# Patient Record
Sex: Male | Born: 1965 | Race: Black or African American | Hispanic: No | Marital: Single | Smoking: Never smoker
Health system: Southern US, Community
[De-identification: ages and names within clinical notes are randomized; demographics above are authoritative.]

## PROBLEM LIST (undated history)

## (undated) DIAGNOSIS — M5126 Other intervertebral disc displacement, lumbar region: Secondary | ICD-10-CM

## (undated) DIAGNOSIS — M549 Dorsalgia, unspecified: Secondary | ICD-10-CM

## (undated) DIAGNOSIS — E119 Type 2 diabetes mellitus without complications: Secondary | ICD-10-CM

## (undated) DIAGNOSIS — L97909 Non-pressure chronic ulcer of unspecified part of unspecified lower leg with unspecified severity: Secondary | ICD-10-CM

## (undated) DIAGNOSIS — N182 Chronic kidney disease, stage 2 (mild): Secondary | ICD-10-CM

## (undated) DIAGNOSIS — T4145XA Adverse effect of unspecified anesthetic, initial encounter: Secondary | ICD-10-CM

## (undated) DIAGNOSIS — R0602 Shortness of breath: Secondary | ICD-10-CM

## (undated) DIAGNOSIS — I499 Cardiac arrhythmia, unspecified: Secondary | ICD-10-CM

## (undated) DIAGNOSIS — G8929 Other chronic pain: Secondary | ICD-10-CM

## (undated) DIAGNOSIS — I4891 Unspecified atrial fibrillation: Secondary | ICD-10-CM

## (undated) DIAGNOSIS — I509 Heart failure, unspecified: Secondary | ICD-10-CM

## (undated) DIAGNOSIS — Z7901 Long term (current) use of anticoagulants: Secondary | ICD-10-CM

## (undated) DIAGNOSIS — K3532 Acute appendicitis with perforation and localized peritonitis, without abscess: Secondary | ICD-10-CM

## (undated) DIAGNOSIS — T8859XA Other complications of anesthesia, initial encounter: Secondary | ICD-10-CM

## (undated) DIAGNOSIS — I10 Essential (primary) hypertension: Secondary | ICD-10-CM

## (undated) HISTORY — PX: ABSCESS DRAINAGE: SHX1119

## (undated) HISTORY — PX: DENTAL SURGERY: SHX609

---

## 1998-05-26 ENCOUNTER — Emergency Department (HOSPITAL_COMMUNITY): Admission: EM | Admit: 1998-05-26 | Discharge: 1998-05-26 | Payer: Self-pay | Admitting: Emergency Medicine

## 1998-07-15 ENCOUNTER — Emergency Department (HOSPITAL_COMMUNITY): Admission: EM | Admit: 1998-07-15 | Discharge: 1998-07-15 | Payer: Self-pay | Admitting: Emergency Medicine

## 1998-07-25 ENCOUNTER — Emergency Department (HOSPITAL_COMMUNITY): Admission: EM | Admit: 1998-07-25 | Discharge: 1998-07-25 | Payer: Self-pay | Admitting: Emergency Medicine

## 1999-10-28 ENCOUNTER — Encounter: Payer: Self-pay | Admitting: Emergency Medicine

## 1999-10-28 ENCOUNTER — Emergency Department (HOSPITAL_COMMUNITY): Admission: EM | Admit: 1999-10-28 | Discharge: 1999-10-28 | Payer: Self-pay | Admitting: Emergency Medicine

## 2004-08-30 ENCOUNTER — Ambulatory Visit (HOSPITAL_COMMUNITY): Admission: RE | Admit: 2004-08-30 | Discharge: 2004-08-30 | Payer: Self-pay | Admitting: Emergency Medicine

## 2004-08-30 ENCOUNTER — Emergency Department (HOSPITAL_COMMUNITY): Admission: EM | Admit: 2004-08-30 | Discharge: 2004-08-30 | Payer: Self-pay | Admitting: Emergency Medicine

## 2009-03-26 ENCOUNTER — Emergency Department (HOSPITAL_COMMUNITY): Admission: EM | Admit: 2009-03-26 | Discharge: 2009-03-26 | Payer: Self-pay | Admitting: Emergency Medicine

## 2009-03-28 ENCOUNTER — Ambulatory Visit: Payer: Self-pay | Admitting: Internal Medicine

## 2009-03-28 ENCOUNTER — Inpatient Hospital Stay (HOSPITAL_COMMUNITY): Admission: EM | Admit: 2009-03-28 | Discharge: 2009-04-04 | Payer: Self-pay | Admitting: Emergency Medicine

## 2009-03-28 ENCOUNTER — Encounter: Payer: Self-pay | Admitting: Infectious Diseases

## 2009-04-02 ENCOUNTER — Ambulatory Visit: Payer: Self-pay | Admitting: Dentistry

## 2009-04-02 ENCOUNTER — Encounter: Payer: Self-pay | Admitting: Internal Medicine

## 2009-04-04 ENCOUNTER — Ambulatory Visit: Payer: Self-pay | Admitting: Dentistry

## 2009-04-14 ENCOUNTER — Encounter: Admission: AD | Admit: 2009-04-14 | Discharge: 2009-04-14 | Payer: Self-pay | Admitting: Dentistry

## 2009-04-18 ENCOUNTER — Ambulatory Visit: Payer: Self-pay | Admitting: Infectious Diseases

## 2009-04-18 ENCOUNTER — Encounter: Payer: Self-pay | Admitting: Internal Medicine

## 2009-04-18 DIAGNOSIS — E1165 Type 2 diabetes mellitus with hyperglycemia: Secondary | ICD-10-CM | POA: Insufficient documentation

## 2009-04-18 DIAGNOSIS — E118 Type 2 diabetes mellitus with unspecified complications: Secondary | ICD-10-CM

## 2009-04-18 DIAGNOSIS — Z9889 Other specified postprocedural states: Secondary | ICD-10-CM | POA: Insufficient documentation

## 2009-04-18 DIAGNOSIS — I10 Essential (primary) hypertension: Secondary | ICD-10-CM | POA: Insufficient documentation

## 2009-04-18 LAB — CONVERTED CEMR LAB
BUN: 25 mg/dL — ABNORMAL HIGH (ref 6–23)
Basophils Absolute: 0.1 10*3/uL (ref 0.0–0.1)
Basophils Relative: 1 % (ref 0–1)
Blood Glucose, Fingerstick: 281
CO2: 22 meq/L (ref 19–32)
Calcium: 9.7 mg/dL (ref 8.4–10.5)
Chloride: 102 meq/L (ref 96–112)
Creatinine, Ser: 1.44 mg/dL (ref 0.40–1.50)
Eosinophils Absolute: 0.4 10*3/uL (ref 0.0–0.7)
Eosinophils Relative: 5 % (ref 0–5)
Glucose, Bld: 300 mg/dL — ABNORMAL HIGH (ref 70–99)
HCT: 44.6 % (ref 39.0–52.0)
Hemoglobin: 14.8 g/dL (ref 13.0–17.0)
Lymphocytes Relative: 38 % (ref 12–46)
Lymphs Abs: 2.9 10*3/uL (ref 0.7–4.0)
MCHC: 33.2 g/dL (ref 30.0–36.0)
MCV: 94.2 fL (ref >=78.0)
Monocytes Absolute: 0.8 10*3/uL (ref 0.1–1.0)
Monocytes Relative: 10 % (ref 3–12)
Neutro Abs: 3.6 10*3/uL (ref 1.7–7.7)
Neutrophils Relative %: 46 % (ref 43–77)
Platelets: 239 10*3/uL (ref 150–400)
Potassium: 4 meq/L (ref 3.5–5.3)
RBC: 4.74 M/uL (ref 4.22–5.81)
RDW: 13.6 % (ref 11.5–15.5)
Sodium: 133 meq/L — ABNORMAL LOW (ref 135–145)
WBC: 7.8 10*3/uL (ref 4.0–10.5)

## 2009-04-18 LAB — HM DIABETES FOOT EXAM: HM Diabetic Foot Exam: NORMAL

## 2009-04-29 ENCOUNTER — Ambulatory Visit: Payer: Self-pay | Admitting: Internal Medicine

## 2010-02-11 ENCOUNTER — Ambulatory Visit (HOSPITAL_COMMUNITY): Admission: RE | Admit: 2010-02-11 | Discharge: 2010-02-11 | Payer: Self-pay | Admitting: Family Medicine

## 2010-09-12 ENCOUNTER — Observation Stay (HOSPITAL_COMMUNITY)
Admission: EM | Admit: 2010-09-12 | Discharge: 2010-09-12 | Disposition: A | Discharge status: Home / Self Care | Payer: Medicare Other | Attending: Emergency Medicine | Admitting: Emergency Medicine

## 2010-09-12 ENCOUNTER — Emergency Department (HOSPITAL_COMMUNITY): Payer: Medicare Other

## 2010-09-12 DIAGNOSIS — R209 Unspecified disturbances of skin sensation: Secondary | ICD-10-CM | POA: Insufficient documentation

## 2010-09-12 DIAGNOSIS — R7309 Other abnormal glucose: Secondary | ICD-10-CM | POA: Insufficient documentation

## 2010-09-12 DIAGNOSIS — M79609 Pain in unspecified limb: Principal | ICD-10-CM | POA: Insufficient documentation

## 2010-09-12 LAB — CBC
HCT: 43.4 % (ref 39.0–52.0)
Hemoglobin: 14.8 g/dL (ref 13.0–17.0)
MCH: 31.2 pg (ref 26.0–34.0)
MCHC: 34.1 g/dL (ref 30.0–36.0)
MCV: 91.6 fL (ref 78.0–100.0)
Platelets: 210 10*3/uL (ref 150–400)
RBC: 4.74 MIL/uL (ref 4.22–5.81)
RDW: 13 % (ref 11.5–15.5)
WBC: 7.3 10*3/uL (ref 4.0–10.5)

## 2010-09-12 LAB — URINALYSIS, ROUTINE W REFLEX MICROSCOPIC
Bilirubin Urine: NEGATIVE
Hgb urine dipstick: NEGATIVE
Ketones, ur: NEGATIVE mg/dL
Leukocytes, UA: NEGATIVE
Nitrite: NEGATIVE
Protein, ur: NEGATIVE mg/dL
Specific Gravity, Urine: 1.039 — ABNORMAL HIGH (ref 1.005–1.030)
Urine Glucose, Fasting: >1000 mg/dL — AB
Urobilinogen, UA: 1 mg/dL (ref 0.0–1.0)
pH: 6 (ref 5.0–8.0)

## 2010-09-12 LAB — DIFFERENTIAL
Basophils Absolute: 0 10*3/uL (ref 0.0–0.1)
Basophils Relative: 0 % (ref 0–1)
Eosinophils Absolute: 0.2 10*3/uL (ref 0.0–0.7)
Eosinophils Relative: 2 % (ref 0–5)
Lymphocytes Relative: 32 % (ref 12–46)
Lymphs Abs: 2.3 10*3/uL (ref 0.7–4.0)
Monocytes Absolute: 0.7 10*3/uL (ref 0.1–1.0)
Monocytes Relative: 10 % (ref 3–12)
Neutro Abs: 4.1 10*3/uL (ref 1.7–7.7)
Neutrophils Relative %: 56 % (ref 43–77)

## 2010-09-12 LAB — BASIC METABOLIC PANEL
BUN: 6 mg/dL (ref 6–23)
CO2: 28 mEq/L (ref 19–32)
Calcium: 8.9 mg/dL (ref 8.4–10.5)
Chloride: 102 mEq/L (ref 96–112)
Creatinine, Ser: 1.02 mg/dL (ref 0.4–1.5)
GFR calc Af Amer: >60 mL/min (ref >60)
GFR calc non Af Amer: >60 mL/min (ref >60)
Glucose, Bld: 428 mg/dL — ABNORMAL HIGH (ref 70–99)
Potassium: 3.8 mEq/L (ref 3.5–5.1)
Sodium: 138 mEq/L (ref 135–145)

## 2010-09-12 LAB — GLUCOSE, CAPILLARY
Glucose-Capillary: 449 mg/dL — ABNORMAL HIGH (ref 70–99)
Glucose-Capillary: 190 mg/dL — ABNORMAL HIGH (ref 70–99)
Glucose-Capillary: 229 mg/dL — ABNORMAL HIGH (ref 70–99)

## 2010-09-12 LAB — URINE MICROSCOPIC-ADD ON

## 2010-09-14 LAB — GLUCOSE, CAPILLARY: Glucose-Capillary: 317 mg/dL — ABNORMAL HIGH (ref 70–99)

## 2010-10-10 LAB — COMPREHENSIVE METABOLIC PANEL
ALT: 33 U/L (ref 0–53)
AST: 28 U/L (ref 0–37)
Albumin: 3.4 g/dL — ABNORMAL LOW (ref 3.5–5.2)
Alkaline Phosphatase: 62 U/L (ref 39–117)
BUN: 12 mg/dL (ref 6–23)
CO2: 24 mEq/L (ref 19–32)
Calcium: 8.8 mg/dL (ref 8.4–10.5)
Chloride: 105 mEq/L (ref 96–112)
Creatinine, Ser: 0.84 mg/dL (ref 0.4–1.5)
GFR calc Af Amer: >60 mL/min (ref >60)
GFR calc non Af Amer: >60 mL/min (ref >60)
Glucose, Bld: 353 mg/dL — ABNORMAL HIGH (ref 70–99)
Potassium: 3.8 mEq/L (ref 3.5–5.1)
Sodium: 135 mEq/L (ref 135–145)
Total Bilirubin: 0.6 mg/dL (ref 0.3–1.2)
Total Protein: 7.2 g/dL (ref 6.0–8.3)

## 2010-10-10 LAB — LIPID PANEL
Cholesterol: 159 mg/dL (ref 0–200)
HDL: 59 mg/dL (ref >39)
LDL Cholesterol: 88 mg/dL (ref 0–99)
Total CHOL/HDL Ratio: 2.7 RATIO
Triglycerides: 58 mg/dL (ref <150)
VLDL: 12 mg/dL (ref 0–40)

## 2010-10-10 LAB — HEMOGLOBIN A1C
Hgb A1c MFr Bld: 14.5 % — ABNORMAL HIGH (ref <5.7)
Mean Plasma Glucose: 369 mg/dL — ABNORMAL HIGH (ref <117)

## 2010-10-10 LAB — CBC
HCT: 45.2 % (ref 39.0–52.0)
Hemoglobin: 15.4 g/dL (ref 13.0–17.0)
MCH: 32.3 pg (ref 26.0–34.0)
MCHC: 34 g/dL (ref 30.0–36.0)
MCV: 95.3 fL (ref 78.0–100.0)
Platelets: 174 10*3/uL (ref 150–400)
RBC: 4.75 MIL/uL (ref 4.22–5.81)
RDW: 13.8 % (ref 11.5–15.5)
WBC: 7.6 10*3/uL (ref 4.0–10.5)

## 2010-10-10 LAB — TSH: TSH: 2.917 u[IU]/mL (ref 0.350–4.500)

## 2010-10-10 LAB — MICROALBUMIN, URINE: Microalb, Ur: 0.99 mg/dL (ref 0.00–1.89)

## 2010-10-10 LAB — PSA: PSA: 0.22 ng/mL (ref 0.10–4.00)

## 2010-10-30 LAB — URINALYSIS, ROUTINE W REFLEX MICROSCOPIC
Glucose, UA: >1000 mg/dL — AB
Ketones, ur: 40 mg/dL — AB
Leukocytes, UA: NEGATIVE
Nitrite: NEGATIVE
Protein, ur: 30 mg/dL — AB
Specific Gravity, Urine: 1.039 — ABNORMAL HIGH (ref 1.005–1.030)
Urobilinogen, UA: 1 mg/dL (ref 0.0–1.0)
pH: 6 (ref 5.0–8.0)

## 2010-10-30 LAB — BASIC METABOLIC PANEL
BUN: 10 mg/dL (ref 6–23)
BUN: 6 mg/dL (ref 6–23)
BUN: 6 mg/dL (ref 6–23)
BUN: 7 mg/dL (ref 6–23)
BUN: 8 mg/dL (ref 6–23)
CO2: 27 mEq/L (ref 19–32)
CO2: 27 mEq/L (ref 19–32)
CO2: 28 mEq/L (ref 19–32)
CO2: 30 mEq/L (ref 19–32)
CO2: 33 mEq/L — ABNORMAL HIGH (ref 19–32)
Calcium: 8.1 mg/dL — ABNORMAL LOW (ref 8.4–10.5)
Calcium: 8.1 mg/dL — ABNORMAL LOW (ref 8.4–10.5)
Calcium: 8.5 mg/dL (ref 8.4–10.5)
Calcium: 8.7 mg/dL (ref 8.4–10.5)
Calcium: 8.9 mg/dL (ref 8.4–10.5)
Chloride: 101 mEq/L (ref 96–112)
Chloride: 103 mEq/L (ref 96–112)
Chloride: 93 mEq/L — ABNORMAL LOW (ref 96–112)
Chloride: 95 mEq/L — ABNORMAL LOW (ref 96–112)
Chloride: 98 mEq/L (ref 96–112)
Creatinine, Ser: 0.82 mg/dL (ref 0.4–1.5)
Creatinine, Ser: 0.91 mg/dL (ref 0.4–1.5)
Creatinine, Ser: 0.94 mg/dL (ref 0.4–1.5)
Creatinine, Ser: 1.41 mg/dL (ref 0.4–1.5)
Creatinine, Ser: 1.5 mg/dL (ref 0.4–1.5)
GFR calc Af Amer: >60 mL/min (ref >60)
GFR calc Af Amer: >60 mL/min (ref >60)
GFR calc Af Amer: >60 mL/min (ref >60)
GFR calc Af Amer: >60 mL/min (ref >60)
GFR calc Af Amer: >60 mL/min (ref >60)
GFR calc non Af Amer: 51 mL/min — ABNORMAL LOW (ref >60)
GFR calc non Af Amer: 54 mL/min — ABNORMAL LOW (ref >60)
GFR calc non Af Amer: >60 mL/min (ref >60)
GFR calc non Af Amer: >60 mL/min (ref >60)
GFR calc non Af Amer: >60 mL/min (ref >60)
Glucose, Bld: 253 mg/dL — ABNORMAL HIGH (ref 70–99)
Glucose, Bld: 271 mg/dL — ABNORMAL HIGH (ref 70–99)
Glucose, Bld: 289 mg/dL — ABNORMAL HIGH (ref 70–99)
Glucose, Bld: 332 mg/dL — ABNORMAL HIGH (ref 70–99)
Glucose, Bld: 344 mg/dL — ABNORMAL HIGH (ref 70–99)
Potassium: 3.4 mEq/L — ABNORMAL LOW (ref 3.5–5.1)
Potassium: 3.6 mEq/L (ref 3.5–5.1)
Potassium: 3.6 mEq/L (ref 3.5–5.1)
Potassium: 3.7 mEq/L (ref 3.5–5.1)
Potassium: 3.8 mEq/L (ref 3.5–5.1)
Sodium: 135 mEq/L (ref 135–145)
Sodium: 135 mEq/L (ref 135–145)
Sodium: 136 mEq/L (ref 135–145)
Sodium: 136 mEq/L (ref 135–145)
Sodium: 138 mEq/L (ref 135–145)

## 2010-10-30 LAB — COMPREHENSIVE METABOLIC PANEL
ALT: 32 U/L (ref 0–53)
AST: 25 U/L (ref 0–37)
Albumin: 2.9 g/dL — ABNORMAL LOW (ref 3.5–5.2)
Alkaline Phosphatase: 67 U/L (ref 39–117)
BUN: 6 mg/dL (ref 6–23)
CO2: 26 mEq/L (ref 19–32)
Calcium: 8.5 mg/dL (ref 8.4–10.5)
Chloride: 101 mEq/L (ref 96–112)
Creatinine, Ser: 0.86 mg/dL (ref 0.4–1.5)
GFR calc Af Amer: >60 mL/min (ref >60)
GFR calc non Af Amer: >60 mL/min (ref >60)
Glucose, Bld: 228 mg/dL — ABNORMAL HIGH (ref 70–99)
Potassium: 3.4 mEq/L — ABNORMAL LOW (ref 3.5–5.1)
Sodium: 135 mEq/L (ref 135–145)
Total Bilirubin: 1.6 mg/dL — ABNORMAL HIGH (ref 0.3–1.2)
Total Protein: 6.8 g/dL (ref 6.0–8.3)

## 2010-10-30 LAB — GLUCOSE, CAPILLARY
Glucose-Capillary: 281 mg/dL — ABNORMAL HIGH (ref 70–99)
Glucose-Capillary: 192 mg/dL — ABNORMAL HIGH (ref 70–99)
Glucose-Capillary: 207 mg/dL — ABNORMAL HIGH (ref 70–99)
Glucose-Capillary: 217 mg/dL — ABNORMAL HIGH (ref 70–99)
Glucose-Capillary: 218 mg/dL — ABNORMAL HIGH (ref 70–99)
Glucose-Capillary: 225 mg/dL — ABNORMAL HIGH (ref 70–99)
Glucose-Capillary: 228 mg/dL — ABNORMAL HIGH (ref 70–99)
Glucose-Capillary: 232 mg/dL — ABNORMAL HIGH (ref 70–99)
Glucose-Capillary: 237 mg/dL — ABNORMAL HIGH (ref 70–99)
Glucose-Capillary: 241 mg/dL — ABNORMAL HIGH (ref 70–99)
Glucose-Capillary: 242 mg/dL — ABNORMAL HIGH (ref 70–99)
Glucose-Capillary: 247 mg/dL — ABNORMAL HIGH (ref 70–99)
Glucose-Capillary: 247 mg/dL — ABNORMAL HIGH (ref 70–99)
Glucose-Capillary: 248 mg/dL — ABNORMAL HIGH (ref 70–99)
Glucose-Capillary: 251 mg/dL — ABNORMAL HIGH (ref 70–99)
Glucose-Capillary: 252 mg/dL — ABNORMAL HIGH (ref 70–99)
Glucose-Capillary: 254 mg/dL — ABNORMAL HIGH (ref 70–99)
Glucose-Capillary: 256 mg/dL — ABNORMAL HIGH (ref 70–99)
Glucose-Capillary: 256 mg/dL — ABNORMAL HIGH (ref 70–99)
Glucose-Capillary: 258 mg/dL — ABNORMAL HIGH (ref 70–99)
Glucose-Capillary: 258 mg/dL — ABNORMAL HIGH (ref 70–99)
Glucose-Capillary: 259 mg/dL — ABNORMAL HIGH (ref 70–99)
Glucose-Capillary: 259 mg/dL — ABNORMAL HIGH (ref 70–99)
Glucose-Capillary: 261 mg/dL — ABNORMAL HIGH (ref 70–99)
Glucose-Capillary: 264 mg/dL — ABNORMAL HIGH (ref 70–99)
Glucose-Capillary: 266 mg/dL — ABNORMAL HIGH (ref 70–99)
Glucose-Capillary: 267 mg/dL — ABNORMAL HIGH (ref 70–99)
Glucose-Capillary: 269 mg/dL — ABNORMAL HIGH (ref 70–99)
Glucose-Capillary: 278 mg/dL — ABNORMAL HIGH (ref 70–99)
Glucose-Capillary: 278 mg/dL — ABNORMAL HIGH (ref 70–99)
Glucose-Capillary: 279 mg/dL — ABNORMAL HIGH (ref 70–99)
Glucose-Capillary: 279 mg/dL — ABNORMAL HIGH (ref 70–99)
Glucose-Capillary: 280 mg/dL — ABNORMAL HIGH (ref 70–99)
Glucose-Capillary: 280 mg/dL — ABNORMAL HIGH (ref 70–99)
Glucose-Capillary: 286 mg/dL — ABNORMAL HIGH (ref 70–99)
Glucose-Capillary: 291 mg/dL — ABNORMAL HIGH (ref 70–99)
Glucose-Capillary: 291 mg/dL — ABNORMAL HIGH (ref 70–99)
Glucose-Capillary: 297 mg/dL — ABNORMAL HIGH (ref 70–99)
Glucose-Capillary: 298 mg/dL — ABNORMAL HIGH (ref 70–99)
Glucose-Capillary: 322 mg/dL — ABNORMAL HIGH (ref 70–99)
Glucose-Capillary: 324 mg/dL — ABNORMAL HIGH (ref 70–99)
Glucose-Capillary: 325 mg/dL — ABNORMAL HIGH (ref 70–99)
Glucose-Capillary: 328 mg/dL — ABNORMAL HIGH (ref 70–99)
Glucose-Capillary: 329 mg/dL — ABNORMAL HIGH (ref 70–99)
Glucose-Capillary: 338 mg/dL — ABNORMAL HIGH (ref 70–99)

## 2010-10-30 LAB — CBC
HCT: 38.2 % — ABNORMAL LOW (ref 39.0–52.0)
HCT: 39.3 % (ref 39.0–52.0)
HCT: 40.2 % (ref 39.0–52.0)
HCT: 40.4 % (ref 39.0–52.0)
HCT: 40.9 % (ref 39.0–52.0)
HCT: 41 % (ref 39.0–52.0)
HCT: 41.7 % (ref 39.0–52.0)
Hemoglobin: 12.8 g/dL — ABNORMAL LOW (ref 13.0–17.0)
Hemoglobin: 13.1 g/dL (ref 13.0–17.0)
Hemoglobin: 13.5 g/dL (ref 13.0–17.0)
Hemoglobin: 13.5 g/dL (ref 13.0–17.0)
Hemoglobin: 13.6 g/dL (ref 13.0–17.0)
Hemoglobin: 13.8 g/dL (ref 13.0–17.0)
Hemoglobin: 14.3 g/dL (ref 13.0–17.0)
MCHC: 33.2 g/dL (ref 30.0–36.0)
MCHC: 33.4 g/dL (ref 30.0–36.0)
MCHC: 33.4 g/dL (ref 30.0–36.0)
MCHC: 33.5 g/dL (ref 30.0–36.0)
MCHC: 33.6 g/dL (ref 30.0–36.0)
MCHC: 33.7 g/dL (ref 30.0–36.0)
MCHC: 34.2 g/dL (ref 30.0–36.0)
MCV: 94.7 fL (ref 78.0–100.0)
MCV: 95.1 fL (ref 78.0–100.0)
MCV: 95.4 fL (ref 78.0–100.0)
MCV: 95.6 fL (ref 78.0–100.0)
MCV: 96.1 fL (ref 78.0–100.0)
MCV: 96.2 fL (ref 78.0–100.0)
MCV: 96.2 fL (ref 78.0–100.0)
Platelets: 161 10*3/uL (ref 150–400)
Platelets: 187 10*3/uL (ref 150–400)
Platelets: 188 10*3/uL (ref 150–400)
Platelets: 198 10*3/uL (ref 150–400)
Platelets: 238 10*3/uL (ref 150–400)
Platelets: 274 10*3/uL (ref 150–400)
Platelets: 281 10*3/uL (ref 150–400)
RBC: 3.97 MIL/uL — ABNORMAL LOW (ref 4.22–5.81)
RBC: 4.09 MIL/uL — ABNORMAL LOW (ref 4.22–5.81)
RBC: 4.2 MIL/uL — ABNORMAL LOW (ref 4.22–5.81)
RBC: 4.2 MIL/uL — ABNORMAL LOW (ref 4.22–5.81)
RBC: 4.29 MIL/uL (ref 4.22–5.81)
RBC: 4.3 MIL/uL (ref 4.22–5.81)
RBC: 4.4 MIL/uL (ref 4.22–5.81)
RDW: 13.7 % (ref 11.5–15.5)
RDW: 13.7 % (ref 11.5–15.5)
RDW: 13.8 % (ref 11.5–15.5)
RDW: 13.8 % (ref 11.5–15.5)
RDW: 14.1 % (ref 11.5–15.5)
RDW: 14.1 % (ref 11.5–15.5)
RDW: 14.1 % (ref 11.5–15.5)
WBC: 10.2 10*3/uL (ref 4.0–10.5)
WBC: 10.4 10*3/uL (ref 4.0–10.5)
WBC: 13 10*3/uL — ABNORMAL HIGH (ref 4.0–10.5)
WBC: 13.3 10*3/uL — ABNORMAL HIGH (ref 4.0–10.5)
WBC: 8.4 10*3/uL (ref 4.0–10.5)
WBC: 9.3 10*3/uL (ref 4.0–10.5)
WBC: 9.7 10*3/uL (ref 4.0–10.5)

## 2010-10-30 LAB — LIPID PANEL
Cholesterol: 131 mg/dL (ref 0–200)
HDL: 49 mg/dL (ref >39)
LDL Cholesterol: 72 mg/dL (ref 0–99)
Total CHOL/HDL Ratio: 2.7 RATIO
Triglycerides: 49 mg/dL (ref <150)
VLDL: 10 mg/dL (ref 0–40)

## 2010-10-30 LAB — CULTURE, ROUTINE-ABSCESS

## 2010-10-30 LAB — ANAEROBIC CULTURE

## 2010-10-30 LAB — POCT I-STAT, CHEM 8
BUN: 10 mg/dL (ref 6–23)
Calcium, Ion: 1.08 mmol/L — ABNORMAL LOW (ref 1.12–1.32)
Chloride: 97 mEq/L (ref 96–112)
Creatinine, Ser: 0.8 mg/dL (ref 0.4–1.5)
Glucose, Bld: 444 mg/dL — ABNORMAL HIGH (ref 70–99)
HCT: 46 % (ref 39.0–52.0)
Hemoglobin: 15.6 g/dL (ref 13.0–17.0)
Potassium: 4 mEq/L (ref 3.5–5.1)
Sodium: 131 mEq/L — ABNORMAL LOW (ref 135–145)
TCO2: 25 mmol/L (ref 0–100)

## 2010-10-30 LAB — CULTURE, BLOOD (ROUTINE X 2)
Culture: NO GROWTH
Culture: NO GROWTH

## 2010-10-30 LAB — DIFFERENTIAL
Basophils Absolute: 0 10*3/uL (ref 0.0–0.1)
Basophils Relative: 0 % (ref 0–1)
Eosinophils Absolute: 0.2 10*3/uL (ref 0.0–0.7)
Eosinophils Relative: 2 % (ref 0–5)
Lymphocytes Relative: 12 % (ref 12–46)
Lymphs Abs: 1.6 10*3/uL (ref 0.7–4.0)
Monocytes Absolute: 1.1 10*3/uL — ABNORMAL HIGH (ref 0.1–1.0)
Monocytes Relative: 8 % (ref 3–12)
Neutro Abs: 10.1 10*3/uL — ABNORMAL HIGH (ref 1.7–7.7)
Neutrophils Relative %: 78 % — ABNORMAL HIGH (ref 43–77)

## 2010-10-30 LAB — URINE CULTURE
Colony Count: NO GROWTH
Culture: NO GROWTH

## 2010-10-30 LAB — HEMOGLOBIN A1C
Hgb A1c MFr Bld: 14.8 % — ABNORMAL HIGH (ref 4.6–6.1)
Mean Plasma Glucose: 378 mg/dL

## 2010-10-30 LAB — TISSUE CULTURE

## 2010-10-30 LAB — RAPID URINE DRUG SCREEN, HOSP PERFORMED
Amphetamines: NOT DETECTED
Barbiturates: NOT DETECTED
Benzodiazepines: POSITIVE — AB
Cocaine: POSITIVE — AB
Opiates: POSITIVE — AB
Tetrahydrocannabinol: NOT DETECTED

## 2010-10-30 LAB — URINE MICROSCOPIC-ADD ON

## 2010-10-30 LAB — CARDIAC PANEL(CRET KIN+CKTOT+MB+TROPI)
CK, MB: 1.4 ng/mL (ref 0.3–4.0)
CK, MB: 2.3 ng/mL (ref 0.3–4.0)
Relative Index: INVALID (ref 0.0–2.5)
Relative Index: INVALID (ref 0.0–2.5)
Total CK: 72 U/L (ref 7–232)
Total CK: 89 U/L (ref 7–232)
Troponin I: 0.03 ng/mL (ref 0.00–0.06)
Troponin I: 0.07 ng/mL — ABNORMAL HIGH (ref 0.00–0.06)

## 2010-10-30 LAB — PROTIME-INR
INR: 1 (ref 0.00–1.49)
Prothrombin Time: 13 seconds (ref 11.6–15.2)

## 2010-10-30 LAB — APTT: aPTT: 30 seconds (ref 24–37)

## 2010-10-30 LAB — TSH: TSH: 1.304 u[IU]/mL (ref 0.350–4.500)

## 2010-10-30 LAB — VANCOMYCIN, TROUGH: Vancomycin Tr: 11.7 ug/mL (ref 10.0–20.0)

## 2010-10-30 LAB — CREATININE, URINE, RANDOM: Creatinine, Urine: 293.1 mg/dL

## 2010-10-30 LAB — MICROALBUMIN, URINE: Microalb, Ur: 7.23 mg/dL — ABNORMAL HIGH (ref 0.00–1.89)

## 2010-10-30 LAB — LACTIC ACID, PLASMA: Lactic Acid, Venous: 1.2 mmol/L (ref 0.5–2.2)

## 2010-10-30 LAB — HIV ANTIBODY (ROUTINE TESTING W REFLEX): HIV: NONREACTIVE

## 2010-12-01 ENCOUNTER — Encounter: Payer: Self-pay | Admitting: Internal Medicine

## 2010-12-21 ENCOUNTER — Emergency Department (HOSPITAL_COMMUNITY)
Admission: EM | Admit: 2010-12-21 | Discharge: 2010-12-21 | Disposition: A | Discharge status: Home / Self Care | Payer: Medicare Other | Attending: Emergency Medicine | Admitting: Emergency Medicine

## 2010-12-21 DIAGNOSIS — E119 Type 2 diabetes mellitus without complications: Secondary | ICD-10-CM | POA: Insufficient documentation

## 2010-12-21 DIAGNOSIS — Z79899 Other long term (current) drug therapy: Secondary | ICD-10-CM | POA: Insufficient documentation

## 2010-12-21 DIAGNOSIS — M7989 Other specified soft tissue disorders: Secondary | ICD-10-CM | POA: Insufficient documentation

## 2010-12-21 DIAGNOSIS — R209 Unspecified disturbances of skin sensation: Secondary | ICD-10-CM | POA: Insufficient documentation

## 2010-12-21 DIAGNOSIS — M79609 Pain in unspecified limb: Secondary | ICD-10-CM | POA: Insufficient documentation

## 2010-12-21 LAB — POCT I-STAT, CHEM 8
BUN: 13 mg/dL (ref 6–23)
Calcium, Ion: 1.11 mmol/L — ABNORMAL LOW (ref 1.12–1.32)
Chloride: 104 mEq/L (ref 96–112)
Creatinine, Ser: 1.2 mg/dL (ref 0.4–1.5)
Glucose, Bld: 145 mg/dL — ABNORMAL HIGH (ref 70–99)
HCT: 41 % (ref 39.0–52.0)
Hemoglobin: 13.9 g/dL (ref 13.0–17.0)
Potassium: 3.5 mEq/L (ref 3.5–5.1)
Sodium: 140 mEq/L (ref 135–145)
TCO2: 24 mmol/L (ref 0–100)

## 2010-12-21 LAB — PRO B NATRIURETIC PEPTIDE: Pro B Natriuretic peptide (BNP): 296.7 pg/mL — ABNORMAL HIGH (ref 0–125)

## 2010-12-30 ENCOUNTER — Encounter (HOSPITAL_COMMUNITY): Payer: Self-pay | Admitting: Radiology

## 2010-12-30 ENCOUNTER — Emergency Department (HOSPITAL_COMMUNITY): Payer: Medicare Other

## 2010-12-30 ENCOUNTER — Emergency Department (HOSPITAL_COMMUNITY)
Admission: EM | Admit: 2010-12-30 | Discharge: 2010-12-31 | Disposition: A | Discharge status: Home / Self Care | Payer: Medicare Other | Attending: Emergency Medicine | Admitting: Emergency Medicine

## 2010-12-30 DIAGNOSIS — R143 Flatulence: Secondary | ICD-10-CM | POA: Insufficient documentation

## 2010-12-30 DIAGNOSIS — Z79899 Other long term (current) drug therapy: Secondary | ICD-10-CM | POA: Insufficient documentation

## 2010-12-30 DIAGNOSIS — R141 Gas pain: Secondary | ICD-10-CM | POA: Insufficient documentation

## 2010-12-30 DIAGNOSIS — R197 Diarrhea, unspecified: Secondary | ICD-10-CM | POA: Insufficient documentation

## 2010-12-30 DIAGNOSIS — R142 Eructation: Secondary | ICD-10-CM | POA: Insufficient documentation

## 2010-12-30 DIAGNOSIS — G609 Hereditary and idiopathic neuropathy, unspecified: Secondary | ICD-10-CM | POA: Insufficient documentation

## 2010-12-30 DIAGNOSIS — R159 Full incontinence of feces: Secondary | ICD-10-CM | POA: Insufficient documentation

## 2010-12-30 DIAGNOSIS — R10817 Generalized abdominal tenderness: Secondary | ICD-10-CM | POA: Insufficient documentation

## 2010-12-30 DIAGNOSIS — E119 Type 2 diabetes mellitus without complications: Secondary | ICD-10-CM | POA: Insufficient documentation

## 2010-12-30 HISTORY — DX: Morbid (severe) obesity due to excess calories: E66.01

## 2010-12-30 LAB — BASIC METABOLIC PANEL WITH GFR
BUN: 11 mg/dL (ref 6–23)
CO2: 31 meq/L (ref 19–32)
Calcium: 8.8 mg/dL (ref 8.4–10.5)
Chloride: 101 meq/L (ref 96–112)
Creatinine, Ser: 0.91 mg/dL (ref 0.4–1.5)
GFR calc non Af Amer: >60 mL/min
Glucose, Bld: 98 mg/dL (ref 70–99)
Potassium: 3.5 meq/L (ref 3.5–5.1)
Sodium: 139 meq/L (ref 135–145)

## 2010-12-30 LAB — POCT I-STAT, CHEM 8
BUN: 9 mg/dL (ref 6–23)
Calcium, Ion: 1.16 mmol/L (ref 1.12–1.32)
Chloride: 102 meq/L (ref 96–112)
Creatinine, Ser: 1.2 mg/dL (ref 0.4–1.5)
Glucose, Bld: 100 mg/dL — ABNORMAL HIGH (ref 70–99)
HCT: 44 % (ref 39.0–52.0)
Hemoglobin: 15 g/dL (ref 13.0–17.0)
Potassium: 3.5 meq/L (ref 3.5–5.1)
Sodium: 141 meq/L (ref 135–145)
TCO2: 30 mmol/L (ref 0–100)

## 2010-12-30 LAB — CBC
HCT: 40.4 % (ref 39.0–52.0)
Hemoglobin: 13.7 g/dL (ref 13.0–17.0)
MCH: 31.4 pg (ref 26.0–34.0)
MCHC: 33.9 g/dL (ref 30.0–36.0)
MCV: 92.4 fL (ref 78.0–100.0)
Platelets: 226 10*3/uL (ref 150–400)
RBC: 4.37 MIL/uL (ref 4.22–5.81)
RDW: 13.3 % (ref 11.5–15.5)
WBC: 8 10*3/uL (ref 4.0–10.5)

## 2010-12-30 LAB — DIFFERENTIAL
Basophils Absolute: 0 10*3/uL (ref 0.0–0.1)
Basophils Relative: 0 % (ref 0–1)
Eosinophils Absolute: 0.2 10*3/uL (ref 0.0–0.7)
Eosinophils Relative: 3 % (ref 0–5)
Lymphocytes Relative: 40 % (ref 12–46)
Lymphs Abs: 3.2 10*3/uL (ref 0.7–4.0)
Monocytes Absolute: 0.7 10*3/uL (ref 0.1–1.0)
Monocytes Relative: 9 % (ref 3–12)
Neutro Abs: 3.8 10*3/uL (ref 1.7–7.7)
Neutrophils Relative %: 48 % (ref 43–77)

## 2010-12-30 LAB — LIPASE, BLOOD: Lipase: 33 U/L (ref 11–59)

## 2010-12-30 MED ORDER — IOHEXOL 300 MG/ML  SOLN
100.0000 mL | Freq: Once | INTRAMUSCULAR | Status: AC | PRN
  Administered 2010-12-30: 100 mL via INTRAVENOUS

## 2010-12-31 LAB — CLOSTRIDIUM DIFFICILE BY PCR: Toxigenic C. Difficile by PCR: NEGATIVE

## 2011-01-04 LAB — STOOL CULTURE

## 2011-01-12 ENCOUNTER — Other Ambulatory Visit: Payer: Self-pay | Admitting: Family Medicine

## 2011-01-12 DIAGNOSIS — M79605 Pain in left leg: Secondary | ICD-10-CM

## 2011-01-12 DIAGNOSIS — M79604 Pain in right leg: Secondary | ICD-10-CM

## 2011-06-25 ENCOUNTER — Emergency Department (HOSPITAL_COMMUNITY): Payer: Medicare Other

## 2011-06-25 ENCOUNTER — Encounter (HOSPITAL_COMMUNITY): Payer: Self-pay | Admitting: *Deleted

## 2011-06-25 ENCOUNTER — Emergency Department (HOSPITAL_COMMUNITY)
Admission: EM | Admit: 2011-06-25 | Discharge: 2011-06-25 | Disposition: A | Discharge status: Home / Self Care | Payer: Medicare Other | Attending: Emergency Medicine | Admitting: Emergency Medicine

## 2011-06-25 DIAGNOSIS — S91209A Unspecified open wound of unspecified toe(s) with damage to nail, initial encounter: Secondary | ICD-10-CM

## 2011-06-25 DIAGNOSIS — M79609 Pain in unspecified limb: Secondary | ICD-10-CM | POA: Insufficient documentation

## 2011-06-25 DIAGNOSIS — X58XXXA Exposure to other specified factors, initial encounter: Secondary | ICD-10-CM | POA: Insufficient documentation

## 2011-06-25 DIAGNOSIS — S8990XA Unspecified injury of unspecified lower leg, initial encounter: Secondary | ICD-10-CM | POA: Insufficient documentation

## 2011-06-25 DIAGNOSIS — E119 Type 2 diabetes mellitus without complications: Secondary | ICD-10-CM | POA: Insufficient documentation

## 2011-06-25 HISTORY — DX: Other intervertebral disc displacement, lumbar region: M51.26

## 2011-06-25 NOTE — ED Provider Notes (Signed)
History     CSN: [id number] Arrival date & time: 06/25/2011 10:14 AM   First MD Initiated Contact with Patient 06/25/11 1121      Chief Complaint  Patient presents with  . Toe Pain    (Consider location/radiation/quality/duration/timing/severity/associated sxs/prior treatment) Patient is a 45 y.o. male presenting with toe pain. The history is provided by the patient.  Toe Pain    Pt is a diabetic and presents to the ED with a toe injury and says that his toenail is falling off. Pt is not having any pain with his toe. Denies step[ping on anything or any recent injury. Pt denies neurological complaints , N/V/D/F.   Past Medical History  Diagnosis Date  . Diabetes mellitus   . Morbid obesity   . Lumbar herniated disc     History reviewed. No pertinent past surgical history.  No family history on file.  History  Substance Use Topics  . Smoking status: Never Smoker   . Smokeless tobacco: Not on file  . Alcohol Use: Yes     rarely      Review of Systems  All other systems reviewed and are negative.    Allergies  Review of patient's allergies indicates no known allergies.  Home Medications   Current Outpatient Rx  Name Route Sig Dispense Refill  . GLIPIZIDE 10 MG PO TABS Oral Take 10 mg by mouth 2 (two) times daily before a meal.      . METFORMIN HCL 1000 MG PO TABS Oral Take 1,000 mg by mouth 2 (two) times daily with a meal.     . MORPHINE SULFATE ER 15 MG PO TB12 Oral Take 15 mg by mouth 2 (two) times daily.      . OXYCODONE-ACETAMINOPHEN 5-325 MG PO TABS Oral Take 1 tablet by mouth every 6 (six) hours as needed.        BP 143/88  Pulse 110  Temp(Src) 98.9 F (37.2 C) (Oral)  Resp 24  Wt 400 lb (181.439 kg)  SpO2 100%  Physical Exam  Nursing note and vitals reviewed. Constitutional: He is oriented to person, place, and time. He appears well-developed and well-nourished.  HENT:  Head: Normocephalic and atraumatic.  Eyes: EOM are normal. Pupils are  equal, round, and reactive to light.  Neck: Normal range of motion.  Cardiovascular: Normal rate and regular rhythm.   Pulmonary/Chest: Effort normal and breath sounds normal.  Musculoskeletal: Normal range of motion.  Neurological: He is alert and oriented to person, place, and time.  Skin: Skin is warm and dry.       ED Course  Procedures (including critical care time)  Labs Reviewed - No data to display Dg Foot Complete Right  06/25/2011  *RADIOLOGY REPORT*  Clinical Data: Trauma to foot.  Diabetes.  Pain.  RIGHT FOOT COMPLETE - 3+ VIEW  Comparison: Three views of the right foot 09/12/2010.  Findings: No acute bone or soft tissue abnormalities are present. Microvascular calcifications are compatible with diabetes.  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  IMPRESSION: No acute abnormality or significant interval change.  Original Report Authenticated By: Jamesetta Orleans. MATTERN, M.D.     No diagnosis found.    MDM  Informed patient that as a diabetic he needs to check his feet regularly, will refer to podiatrist.        Dorthula Matas, PA 06/25/11 1256

## 2011-06-25 NOTE — ED Provider Notes (Signed)
Medical screening examination/treatment/procedure(s) were performed by non-physician practitioner and as supervising physician I was immediately available for consultation/collaboration.   Laray Anger, DO 06/25/11 1945

## 2011-06-25 NOTE — ED Notes (Signed)
Pt states 'I hit my toe about 2 mos ago but it's been less than a week, it's hanging off and I'm a diabetic";pt indicates is right foot 5th digit

## 2011-09-04 ENCOUNTER — Emergency Department (HOSPITAL_COMMUNITY)
Admission: EM | Admit: 2011-09-04 | Discharge: 2011-09-04 | Disposition: A | Discharge status: Home / Self Care | Payer: No Typology Code available for payment source | Attending: Emergency Medicine | Admitting: Emergency Medicine

## 2011-09-04 ENCOUNTER — Encounter (HOSPITAL_COMMUNITY): Payer: Self-pay

## 2011-09-04 DIAGNOSIS — M542 Cervicalgia: Secondary | ICD-10-CM | POA: Insufficient documentation

## 2011-09-04 DIAGNOSIS — M545 Low back pain, unspecified: Secondary | ICD-10-CM | POA: Insufficient documentation

## 2011-09-04 DIAGNOSIS — E119 Type 2 diabetes mellitus without complications: Secondary | ICD-10-CM | POA: Insufficient documentation

## 2011-09-04 DIAGNOSIS — Z79899 Other long term (current) drug therapy: Secondary | ICD-10-CM | POA: Insufficient documentation

## 2011-09-04 DIAGNOSIS — IMO0001 Reserved for inherently not codable concepts without codable children: Secondary | ICD-10-CM | POA: Insufficient documentation

## 2011-09-04 DIAGNOSIS — T148XXA Other injury of unspecified body region, initial encounter: Secondary | ICD-10-CM | POA: Insufficient documentation

## 2011-09-04 DIAGNOSIS — M546 Pain in thoracic spine: Secondary | ICD-10-CM | POA: Insufficient documentation

## 2011-09-04 MED ORDER — IBUPROFEN 200 MG PO TABS
400.0000 mg | ORAL_TABLET | Freq: Once | ORAL | Status: AC
  Administered 2011-09-04: 400 mg via ORAL
  Filled 2011-09-04: qty 2

## 2011-09-04 MED ORDER — ACETAMINOPHEN-CODEINE #3 300-30 MG PO TABS: 1.0000 | ORAL_TABLET | Freq: Four times a day (QID) | ORAL | Status: AC | PRN

## 2011-09-04 MED ORDER — DIAZEPAM 5 MG PO TABS
5.0000 mg | ORAL_TABLET | Freq: Once | ORAL | Status: AC
  Administered 2011-09-04: 5 mg via ORAL
  Filled 2011-09-04: qty 1

## 2011-09-04 MED ORDER — IBUPROFEN 600 MG PO TABS: 600.0000 mg | ORAL_TABLET | Freq: Four times a day (QID) | ORAL | Status: AC | PRN

## 2011-09-04 MED ORDER — DIAZEPAM 5 MG PO TABS: ORAL_TABLET | ORAL | Status: AC
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
## 2011-09-14 ENCOUNTER — Ambulatory Visit: Payer: No Typology Code available for payment source | Admitting: Physical Therapy

## 2011-09-15 ENCOUNTER — Other Ambulatory Visit: Payer: Self-pay | Admitting: Family Medicine

## 2011-09-15 ENCOUNTER — Ambulatory Visit
Admission: RE | Admit: 2011-09-15 | Discharge: 2011-09-15 | Disposition: A | Discharge status: Home / Self Care | Payer: Medicare Other | Source: Ambulatory Visit | Attending: Family Medicine | Admitting: Family Medicine

## 2011-09-15 DIAGNOSIS — M542 Cervicalgia: Secondary | ICD-10-CM

## 2011-09-15 DIAGNOSIS — M79605 Pain in left leg: Secondary | ICD-10-CM

## 2011-09-15 DIAGNOSIS — M25562 Pain in left knee: Secondary | ICD-10-CM

## 2011-09-15 DIAGNOSIS — M549 Dorsalgia, unspecified: Secondary | ICD-10-CM

## 2011-09-15 DIAGNOSIS — M25512 Pain in left shoulder: Secondary | ICD-10-CM

## 2011-09-16 ENCOUNTER — Ambulatory Visit: Payer: No Typology Code available for payment source | Attending: Family Medicine | Admitting: Physical Therapy

## 2011-09-16 DIAGNOSIS — M25519 Pain in unspecified shoulder: Secondary | ICD-10-CM | POA: Insufficient documentation

## 2011-09-16 DIAGNOSIS — M542 Cervicalgia: Secondary | ICD-10-CM | POA: Insufficient documentation

## 2011-09-16 DIAGNOSIS — M25559 Pain in unspecified hip: Secondary | ICD-10-CM | POA: Insufficient documentation

## 2011-09-16 DIAGNOSIS — IMO0001 Reserved for inherently not codable concepts without codable children: Secondary | ICD-10-CM | POA: Insufficient documentation

## 2011-09-16 DIAGNOSIS — M545 Low back pain, unspecified: Secondary | ICD-10-CM | POA: Insufficient documentation

## 2011-09-21 ENCOUNTER — Ambulatory Visit: Payer: No Typology Code available for payment source | Admitting: Physical Therapy

## 2011-09-23 ENCOUNTER — Ambulatory Visit: Payer: No Typology Code available for payment source | Admitting: Physical Therapy

## 2011-09-28 ENCOUNTER — Ambulatory Visit: Payer: No Typology Code available for payment source | Attending: Family Medicine | Admitting: Physical Therapy

## 2011-09-28 DIAGNOSIS — M542 Cervicalgia: Secondary | ICD-10-CM | POA: Insufficient documentation

## 2011-09-28 DIAGNOSIS — M25559 Pain in unspecified hip: Secondary | ICD-10-CM | POA: Insufficient documentation

## 2011-09-28 DIAGNOSIS — IMO0001 Reserved for inherently not codable concepts without codable children: Secondary | ICD-10-CM | POA: Insufficient documentation

## 2011-09-28 DIAGNOSIS — M545 Low back pain, unspecified: Secondary | ICD-10-CM | POA: Insufficient documentation

## 2011-09-28 DIAGNOSIS — M25519 Pain in unspecified shoulder: Secondary | ICD-10-CM | POA: Insufficient documentation

## 2011-09-30 ENCOUNTER — Ambulatory Visit: Payer: No Typology Code available for payment source | Admitting: Physical Therapy

## 2011-10-05 ENCOUNTER — Ambulatory Visit: Payer: No Typology Code available for payment source | Admitting: Physical Therapy

## 2011-10-07 ENCOUNTER — Ambulatory Visit: Payer: No Typology Code available for payment source | Admitting: Physical Therapy

## 2011-10-12 ENCOUNTER — Ambulatory Visit: Payer: No Typology Code available for payment source | Admitting: Physical Therapy

## 2011-10-14 ENCOUNTER — Ambulatory Visit: Payer: No Typology Code available for payment source | Admitting: Rehabilitation

## 2011-10-19 ENCOUNTER — Ambulatory Visit: Payer: No Typology Code available for payment source | Admitting: Physical Therapy

## 2011-10-21 ENCOUNTER — Ambulatory Visit: Payer: No Typology Code available for payment source | Admitting: Physical Therapy

## 2011-10-26 ENCOUNTER — Ambulatory Visit: Payer: No Typology Code available for payment source | Attending: Family Medicine | Admitting: Physical Therapy

## 2011-10-26 DIAGNOSIS — M542 Cervicalgia: Secondary | ICD-10-CM | POA: Insufficient documentation

## 2011-10-26 DIAGNOSIS — M545 Low back pain, unspecified: Secondary | ICD-10-CM | POA: Insufficient documentation

## 2011-10-26 DIAGNOSIS — M25559 Pain in unspecified hip: Secondary | ICD-10-CM | POA: Insufficient documentation

## 2011-10-26 DIAGNOSIS — IMO0001 Reserved for inherently not codable concepts without codable children: Secondary | ICD-10-CM | POA: Insufficient documentation

## 2011-10-26 DIAGNOSIS — M25519 Pain in unspecified shoulder: Secondary | ICD-10-CM | POA: Insufficient documentation

## 2011-10-28 ENCOUNTER — Ambulatory Visit: Payer: No Typology Code available for payment source | Admitting: Physical Therapy

## 2011-11-16 ENCOUNTER — Ambulatory Visit: Payer: No Typology Code available for payment source | Admitting: Physical Therapy

## 2012-07-07 ENCOUNTER — Emergency Department (HOSPITAL_COMMUNITY): Payer: Medicare Other

## 2012-07-07 ENCOUNTER — Inpatient Hospital Stay (HOSPITAL_COMMUNITY)
Admission: EM | Admit: 2012-07-07 | Discharge: 2012-07-13 | DRG: 372 | Disposition: A | Discharge status: Home / Self Care | Payer: Medicare Other | Attending: General Surgery | Admitting: General Surgery

## 2012-07-07 ENCOUNTER — Encounter (HOSPITAL_COMMUNITY): Payer: Self-pay | Admitting: Emergency Medicine

## 2012-07-07 DIAGNOSIS — I10 Essential (primary) hypertension: Secondary | ICD-10-CM | POA: Diagnosis present

## 2012-07-07 DIAGNOSIS — G8929 Other chronic pain: Secondary | ICD-10-CM | POA: Diagnosis present

## 2012-07-07 DIAGNOSIS — K352 Acute appendicitis with generalized peritonitis, without abscess: Principal | ICD-10-CM | POA: Diagnosis present

## 2012-07-07 DIAGNOSIS — K3580 Unspecified acute appendicitis: Secondary | ICD-10-CM

## 2012-07-07 DIAGNOSIS — K35209 Acute appendicitis with generalized peritonitis, without abscess, unspecified as to perforation: Principal | ICD-10-CM | POA: Diagnosis present

## 2012-07-07 DIAGNOSIS — K3532 Acute appendicitis with perforation and localized peritonitis, without abscess: Secondary | ICD-10-CM | POA: Diagnosis present

## 2012-07-07 DIAGNOSIS — Z6841 Body Mass Index (BMI) 40.0 and over, adult: Secondary | ICD-10-CM

## 2012-07-07 DIAGNOSIS — M5126 Other intervertebral disc displacement, lumbar region: Secondary | ICD-10-CM | POA: Diagnosis present

## 2012-07-07 DIAGNOSIS — E119 Type 2 diabetes mellitus without complications: Secondary | ICD-10-CM | POA: Diagnosis present

## 2012-07-07 HISTORY — DX: Essential (primary) hypertension: I10

## 2012-07-07 HISTORY — DX: Dorsalgia, unspecified: M54.9

## 2012-07-07 HISTORY — DX: Morbid (severe) obesity due to excess calories: E66.01

## 2012-07-07 HISTORY — DX: Other chronic pain: G89.29

## 2012-07-07 HISTORY — DX: Acute appendicitis with perforation and localized peritonitis, without abscess: K35.32

## 2012-07-07 LAB — CBC WITH DIFFERENTIAL/PLATELET
Basophils Absolute: 0 10*3/uL (ref 0.0–0.1)
Basophils Relative: 0 % (ref 0–1)
Eosinophils Absolute: 0.1 10*3/uL (ref 0.0–0.7)
Eosinophils Relative: 1 % (ref 0–5)
HCT: 40.1 % (ref 39.0–52.0)
Hemoglobin: 13.4 g/dL (ref 13.0–17.0)
Lymphocytes Relative: 18 % (ref 12–46)
Lymphs Abs: 2.2 10*3/uL (ref 0.7–4.0)
MCH: 31.2 pg (ref 26.0–34.0)
MCHC: 33.4 g/dL (ref 30.0–36.0)
MCV: 93.5 fL (ref 78.0–100.0)
Monocytes Absolute: 1.5 10*3/uL — ABNORMAL HIGH (ref 0.1–1.0)
Monocytes Relative: 12 % (ref 3–12)
Neutro Abs: 8.5 10*3/uL — ABNORMAL HIGH (ref 1.7–7.7)
Neutrophils Relative %: 69 % (ref 43–77)
Platelets: 198 10*3/uL (ref 150–400)
RBC: 4.29 MIL/uL (ref 4.22–5.81)
RDW: 13.5 % (ref 11.5–15.5)
WBC: 12.4 10*3/uL — ABNORMAL HIGH (ref 4.0–10.5)

## 2012-07-07 LAB — URINE MICROSCOPIC-ADD ON

## 2012-07-07 LAB — URINALYSIS, ROUTINE W REFLEX MICROSCOPIC
Glucose, UA: NEGATIVE mg/dL
Hgb urine dipstick: NEGATIVE
Ketones, ur: 15 mg/dL — AB
Leukocytes, UA: NEGATIVE
Nitrite: NEGATIVE
Protein, ur: 30 mg/dL — AB
Specific Gravity, Urine: 1.038 — ABNORMAL HIGH (ref 1.005–1.030)
Urobilinogen, UA: 1 mg/dL (ref 0.0–1.0)
pH: 5.5 (ref 5.0–8.0)

## 2012-07-07 LAB — BASIC METABOLIC PANEL
BUN: 11 mg/dL (ref 6–23)
CO2: 27 mEq/L (ref 19–32)
Calcium: 9.2 mg/dL (ref 8.4–10.5)
Chloride: 99 mEq/L (ref 96–112)
Creatinine, Ser: 0.96 mg/dL (ref 0.50–1.35)
GFR calc Af Amer: >90 mL/min (ref >90)
GFR calc non Af Amer: >90 mL/min (ref >90)
Glucose, Bld: 88 mg/dL (ref 70–99)
Potassium: 3.5 mEq/L (ref 3.5–5.1)
Sodium: 135 mEq/L (ref 135–145)

## 2012-07-07 MED ORDER — SODIUM CHLORIDE 0.9 % IV BOLUS (SEPSIS)
1000.0000 mL | Freq: Once | INTRAVENOUS | Status: AC
  Administered 2012-07-07: 1000 mL via INTRAVENOUS

## 2012-07-07 MED ORDER — HYDROMORPHONE HCL PF 1 MG/ML IJ SOLN
1.0000 mg | Freq: Once | INTRAMUSCULAR | Status: AC
  Administered 2012-07-07: 1 mg via INTRAVENOUS
  Filled 2012-07-07: qty 1

## 2012-07-07 MED ORDER — IOHEXOL 300 MG/ML  SOLN: 20.0000 mL | INTRAMUSCULAR | Status: AC

## 2012-07-07 MED ORDER — ONDANSETRON HCL 4 MG/2ML IJ SOLN
4.0000 mg | Freq: Once | INTRAMUSCULAR | Status: AC
  Administered 2012-07-07: 4 mg via INTRAVENOUS
  Filled 2012-07-07: qty 2

## 2012-07-07 MED ORDER — IOHEXOL 300 MG/ML  SOLN
20.0000 mL | INTRAMUSCULAR | Status: AC
  Administered 2012-07-07: 20 mL via ORAL

## 2012-07-07 NOTE — ED Provider Notes (Signed)
History     CSN: [id number]  Arrival date & time 07/07/12  1523   First MD Initiated Contact with Patient 07/07/12 2150      Chief Complaint  Patient presents with  . Abdominal Pain    (Consider location/radiation/quality/duration/timing/severity/associated sxs/prior treatment) HPI Pt reports chronic diarrhea off and on for several months. He went to see GI earlier this week for evaluation and scheduled for colonoscopy in January. He was not started on any new medications, but states since that appointment he has had worsening sharp/aching bilateral upper abdominal pain, radiating around to flank worse with deep breath and cough, not associated with fever, nausea, vomiting. He has not had a BM in 3 days which is unusual for him.   Past Medical History  Diagnosis Date  . Diabetes mellitus   . Morbid obesity   . Lumbar herniated disc     Past Surgical History  Procedure Date  . Abscess drainage     History reviewed. No pertinent family history.  History  Substance Use Topics  . Smoking status: Never Smoker   . Smokeless tobacco: Not on file  . Alcohol Use: No     Comment: rarely      Review of Systems All other systems reviewed and are negative except as noted in HPI.   Allergies  Review of patient's allergies indicates no known allergies.  Home Medications   Current Outpatient Rx  Name  Route  Sig  Dispense  Refill  . GLIPIZIDE 10 MG PO TABS   Oral   Take 10 mg by mouth 2 (two) times daily before a meal.           . METFORMIN HCL 1000 MG PO TABS   Oral   Take 1,000 mg by mouth 2 (two) times daily with a meal.          . NAPROXEN SODIUM 220 MG PO TABS   Oral   Take 440 mg by mouth daily as needed. For pain           BP 171/87  Pulse 96  Temp 99.9 F (37.7 C) (Oral)  Resp 24  SpO2 98%  Physical Exam  Nursing note and vitals reviewed. Constitutional: He is oriented to person, place, and time. He appears well-developed.       Morbidly  obese  HENT:  Head: Normocephalic and atraumatic.  Eyes: EOM are normal. Pupils are equal, round, and reactive to light.  Neck: Normal range of motion. Neck supple.  Cardiovascular: Normal rate, normal heart sounds and intact distal pulses.   Pulmonary/Chest: Effort normal and breath sounds normal.  Abdominal: Bowel sounds are normal. He exhibits no distension. There is tenderness (diffuse tenderness, most severe in LLQ). There is no rebound and no guarding.  Musculoskeletal: Normal range of motion. He exhibits no edema and no tenderness.  Neurological: He is alert and oriented to person, place, and time. He has normal strength. No cranial nerve deficit or sensory deficit.  Skin: Skin is warm and dry. No rash noted.  Psychiatric: He has a normal mood and affect.    ED Course  Procedures (including critical care time)  Labs Reviewed  CBC WITH DIFFERENTIAL - Abnormal; Notable for the following:    WBC 12.4 (*)     Neutro Abs 8.5 (*)     Monocytes Absolute 1.5 (*)     All other components within normal limits  URINALYSIS, ROUTINE W REFLEX MICROSCOPIC - Abnormal; Notable for the following:  Color, Urine AMBER (*)  BIOCHEMICALS MAY BE AFFECTED BY COLOR   Specific Gravity, Urine 1.038 (*)     Bilirubin Urine SMALL (*)     Ketones, ur 15 (*)     Protein, ur 30 (*)     All other components within normal limits  URINE MICROSCOPIC-ADD ON - Abnormal; Notable for the following:    Squamous Epithelial / LPF FEW (*)     Bacteria, UA FEW (*)     All other components within normal limits  BASIC METABOLIC PANEL  URINE CULTURE   Ct Abdomen Pelvis W Contrast  07/08/2012  *RADIOLOGY REPORT*  Clinical Data: Left lower quadrant pain and diarrhea.  CT ABDOMEN AND PELVIS WITH CONTRAST  Technique:  Multidetector CT imaging of the abdomen and pelvis was performed following the standard protocol during bolus administration of intravenous contrast.  Contrast: OMNIPAQUE IOHEXOL 300 MG/ML  SOLN   Comparison: 12/30/2010  Findings: Atelectasis in the lung bases.  Technically limited study due to the patient's body habitus. Atelectasis in the lung bases.  There is inflammatory infiltrative process in the right lower quadrant extending along the left pericolic gutter.  On the previous comparison study, the appendix is located in this area. The appendix is not specifically identified today.  Likely, this represents acute appendicitis with localized rupture and associated inflammatory edema.  No discrete abscess is demonstrated.  Is mild wall thickening in the adjacent terminal ileum which is likely reactive.  As visualized, the liver, spleen, gallbladder, pancreas, adrenal glands, abdominal aorta, and retroperitoneal lymph nodes are unremarkable.  There is a parenchymal cyst in the midportion of the left kidney.  No solid mass or hydronephrosis is identified in either kidney.  The stomach, small bowel, and colon are not abnormally distended.  No free air or free fluid in the abdomen.  Pelvis:  The prostate gland is not enlarged.  The bladder wall is not thickened.  No free or loculated pelvic fluid collections.  No significant pelvic lymphadenopathy.  Degenerative changes in the lumbar spine.  IMPRESSION: Inflammatory process in the right lower quadrant surrounding the location of the appendix on a prior study.  Although the appendix is not specifically identified, the appearance is likely represent acute appendicitis.  No discrete abscess.   Original Report Authenticated By: Burman Nieves, M.D.    Dg Abd Acute W/chest  07/07/2012  *RADIOLOGY REPORT*  Clinical Data: Bilateral flank pain  ACUTE ABDOMEN SERIES (ABDOMEN 2 VIEW & CHEST 1 VIEW)  Comparison: Chest radiographs dated 02/11/2010.  CT abdomen pelvis dated 12/30/2010.  Findings: Increased interstitial markings/bronchitic changes, chronic. Left basilar scarring/atelectasis.  No pleural effusion or pneumothorax.  Nonobstructive bowel gas pattern.  No  evidence of free air under the diaphragm on the upright view.  Visualized osseous structures are within normal limits.  IMPRESSION: No evidence of acute cardiopulmonary disease.  No evidence of small bowel obstruction or free air.   Original Report Authenticated By: Charline Bills, M.D.      No diagnosis found.    MDM  Labs and AAS done in triage reviewed, unremarkable aside for mild leukocytosis. LLQ tenderness concerning for diverticulitis. Will give IVF, pain and nausea meds and send for CT Abd/Pel.    12:41 AM CT as above, concerning for appendicitis. Discussed with Dr. Cleophas Dunker who will evaluate the patient.        Charles B. Bernette Mayers, MD 07/08/12 [phone]

## 2012-07-07 NOTE — ED Notes (Signed)
Pt c/o abd pain to bilateral flank with inspiration and movement x several days; pt sts no BM in 2 days and recent hx of chronic diarrhea

## 2012-07-07 NOTE — ED Notes (Signed)
Report given to CDU AM, RN, CT contacted "finished with contrast".

## 2012-07-08 ENCOUNTER — Encounter (HOSPITAL_COMMUNITY): Payer: Self-pay | Admitting: Radiology

## 2012-07-08 DIAGNOSIS — K352 Acute appendicitis with generalized peritonitis, without abscess: Secondary | ICD-10-CM

## 2012-07-08 LAB — GLUCOSE, CAPILLARY
Glucose-Capillary: 103 mg/dL — ABNORMAL HIGH (ref 70–99)
Glucose-Capillary: 104 mg/dL — ABNORMAL HIGH (ref 70–99)
Glucose-Capillary: 77 mg/dL (ref 70–99)
Glucose-Capillary: 93 mg/dL (ref 70–99)
Glucose-Capillary: 94 mg/dL (ref 70–99)
Glucose-Capillary: 94 mg/dL (ref 70–99)

## 2012-07-08 LAB — BASIC METABOLIC PANEL
BUN: 10 mg/dL (ref 6–23)
CO2: 25 mEq/L (ref 19–32)
Calcium: 8.6 mg/dL (ref 8.4–10.5)
Chloride: 101 mEq/L (ref 96–112)
Creatinine, Ser: 0.81 mg/dL (ref 0.50–1.35)
GFR calc Af Amer: >90 mL/min (ref >90)
GFR calc non Af Amer: >90 mL/min (ref >90)
Glucose, Bld: 88 mg/dL (ref 70–99)
Potassium: 3.4 mEq/L — ABNORMAL LOW (ref 3.5–5.1)
Sodium: 136 mEq/L (ref 135–145)

## 2012-07-08 LAB — CBC
HCT: 36.3 % — ABNORMAL LOW (ref 39.0–52.0)
Hemoglobin: 12.1 g/dL — ABNORMAL LOW (ref 13.0–17.0)
MCH: 31 pg (ref 26.0–34.0)
MCHC: 33.3 g/dL (ref 30.0–36.0)
MCV: 93.1 fL (ref 78.0–100.0)
Platelets: 197 10*3/uL (ref 150–400)
RBC: 3.9 MIL/uL — ABNORMAL LOW (ref 4.22–5.81)
RDW: 13.4 % (ref 11.5–15.5)
WBC: 10.8 10*3/uL — ABNORMAL HIGH (ref 4.0–10.5)

## 2012-07-08 MED ORDER — BIOTENE DRY MOUTH MT LIQD
15.0000 mL | Freq: Two times a day (BID) | OROMUCOSAL | Status: DC
  Administered 2012-07-08 – 2012-07-09 (×2): 15 mL via OROMUCOSAL

## 2012-07-08 MED ORDER — GLUCOSE-VITAMIN C 4-6 GM-MG PO CHEW
CHEWABLE_TABLET | ORAL | Status: AC
  Filled 2012-07-08: qty 1

## 2012-07-08 MED ORDER — SODIUM CHLORIDE 0.9 % IV SOLN
INTRAVENOUS | Status: DC
  Administered 2012-07-08 – 2012-07-09 (×3): via INTRAVENOUS

## 2012-07-08 MED ORDER — CHLORHEXIDINE GLUCONATE 0.12 % MT SOLN
15.0000 mL | Freq: Two times a day (BID) | OROMUCOSAL | Status: DC
  Administered 2012-07-08 – 2012-07-10 (×4): 15 mL via OROMUCOSAL
  Filled 2012-07-08: qty 15

## 2012-07-08 MED ORDER — ACETAMINOPHEN 650 MG RE SUPP: 650.0000 mg | Freq: Four times a day (QID) | RECTAL | Status: DC | PRN

## 2012-07-08 MED ORDER — ONDANSETRON HCL 4 MG/2ML IJ SOLN: 4.0000 mg | Freq: Four times a day (QID) | INTRAMUSCULAR | Status: DC | PRN

## 2012-07-08 MED ORDER — MORPHINE SULFATE 2 MG/ML IJ SOLN: 2.0000 mg | INTRAMUSCULAR | Status: DC | PRN

## 2012-07-08 MED ORDER — ACETAMINOPHEN 325 MG PO TABS
650.0000 mg | ORAL_TABLET | Freq: Four times a day (QID) | ORAL | Status: DC | PRN
  Administered 2012-07-10 – 2012-07-12 (×4): 650 mg via ORAL
  Filled 2012-07-08 (×4): qty 2

## 2012-07-08 MED ORDER — PANTOPRAZOLE SODIUM 40 MG IV SOLR
40.0000 mg | Freq: Every day | INTRAVENOUS | Status: DC
  Administered 2012-07-08 – 2012-07-09 (×2): 40 mg via INTRAVENOUS
  Filled 2012-07-08 (×3): qty 40

## 2012-07-08 MED ORDER — INSULIN ASPART 100 UNIT/ML ~~LOC~~ SOLN
0.0000 [IU] | Freq: Three times a day (TID) | SUBCUTANEOUS | Status: DC
  Administered 2012-07-09 (×2): 2 [IU] via SUBCUTANEOUS
  Administered 2012-07-10: 3 [IU] via SUBCUTANEOUS
  Administered 2012-07-10 (×2): 2 [IU] via SUBCUTANEOUS
  Administered 2012-07-11: 3 [IU] via SUBCUTANEOUS
  Administered 2012-07-11: 2 [IU] via SUBCUTANEOUS
  Administered 2012-07-11 – 2012-07-12 (×2): 3 [IU] via SUBCUTANEOUS
  Administered 2012-07-12 (×2): 2 [IU] via SUBCUTANEOUS
  Administered 2012-07-13: 3 [IU] via SUBCUTANEOUS

## 2012-07-08 MED ORDER — HEPARIN SODIUM (PORCINE) 5000 UNIT/ML IJ SOLN
5000.0000 [IU] | Freq: Three times a day (TID) | INTRAMUSCULAR | Status: DC
  Administered 2012-07-08 – 2012-07-13 (×16): 5000 [IU] via SUBCUTANEOUS
  Filled 2012-07-08 (×19): qty 1

## 2012-07-08 MED ORDER — IOHEXOL 300 MG/ML  SOLN
100.0000 mL | Freq: Once | INTRAMUSCULAR | Status: AC | PRN
  Administered 2012-07-08: 100 mL via INTRAVENOUS

## 2012-07-08 MED ORDER — PIPERACILLIN-TAZOBACTAM 3.375 G IVPB
3.3750 g | Freq: Three times a day (TID) | INTRAVENOUS | Status: DC
  Administered 2012-07-08 – 2012-07-13 (×16): 3.375 g via INTRAVENOUS
  Filled 2012-07-08 (×18): qty 50

## 2012-07-08 NOTE — H&P (Signed)
Maurice Nguyen. is an 46 y.o. male.   Chief Complaint: abdominal pain referred by Dr. Renae Nguyen HPI:  33 yom who is morbidly obese and was recently evaluated by Dr. Bosie Nguyen for loose stools and some difficulty with control.  On Tuesday he began having bilateral lower quadrant abdominal pain.  This is worse with movement and palpation.  Better when lying still. There has been no real change in pain since then. He came to er tonight due to persistence of pain.  He is passing flatus but no bm last couple days.  Denies n/v.  No fevers.  No urinary symptoms.  He is anorectic.   Past Medical History  Diagnosis Date  . Diabetes mellitus   . Morbid obesity   . Lumbar herniated disc     Past Surgical History  Procedure Date  . Abscess drainage   on leg  History reviewed. No pertinent family history. Social History:  reports that he has never smoked. He does not have any smokeless tobacco history on file. He reports that he does not drink alcohol or use illicit drugs.  Allergies: No Known Allergies  Meds glucophage, glipizide  Results for orders placed during the hospital encounter of 07/07/12 (from the past 48 hour(s))  URINALYSIS, ROUTINE W REFLEX MICROSCOPIC     Status: Abnormal   Collection Time   07/07/12  3:44 PM      Component Value Range Comment   Color, Urine AMBER (*) YELLOW BIOCHEMICALS MAY BE AFFECTED BY COLOR   APPearance CLEAR  CLEAR    Specific Gravity, Urine 1.038 (*) 1.005 - 1.030    pH 5.5  5.0 - 8.0    Glucose, UA NEGATIVE  NEGATIVE mg/dL    Hgb urine dipstick NEGATIVE  NEGATIVE    Bilirubin Urine SMALL (*) NEGATIVE    Ketones, ur 15 (*) NEGATIVE mg/dL    Protein, ur 30 (*) NEGATIVE mg/dL    Urobilinogen, UA 1.0  0.0 - 1.0 mg/dL    Nitrite NEGATIVE  NEGATIVE    Leukocytes, UA NEGATIVE  NEGATIVE   URINE MICROSCOPIC-ADD ON     Status: Abnormal   Collection Time   07/07/12  3:44 PM      Component Value Range Comment   Squamous Epithelial / LPF FEW (*) RARE     WBC, UA 3-6  <3 WBC/hpf    RBC / HPF 0-2  <3 RBC/hpf    Bacteria, UA FEW (*) RARE    Urine-Other MUCOUS PRESENT     CBC WITH DIFFERENTIAL     Status: Abnormal   Collection Time   07/07/12  8:04 PM      Component Value Range Comment   WBC 12.4 (*) 4.0 - 10.5 K/uL    RBC 4.29  4.22 - 5.81 MIL/uL    Hemoglobin 13.4  13.0 - 17.0 g/dL    HCT 95.2  84.1 - 32.4 %    MCV 93.5  78.0 - 100.0 fL    MCH 31.2  26.0 - 34.0 pg    MCHC 33.4  30.0 - 36.0 g/dL    RDW 40.1  02.7 - 25.3 %    Platelets 198  150 - 400 K/uL    Neutrophils Relative 69  43 - 77 %    Neutro Abs 8.5 (*) 1.7 - 7.7 K/uL    Lymphocytes Relative 18  12 - 46 %    Lymphs Abs 2.2  0.7 - 4.0 K/uL    Monocytes Relative 12  3 - 12 %    Monocytes Absolute 1.5 (*) 0.1 - 1.0 K/uL    Eosinophils Relative 1  0 - 5 %    Eosinophils Absolute 0.1  0.0 - 0.7 K/uL    Basophils Relative 0  0 - 1 %    Basophils Absolute 0.0  0.0 - 0.1 K/uL   BASIC METABOLIC PANEL     Status: Normal   Collection Time   07/07/12  8:04 PM      Component Value Range Comment   Sodium 135  135 - 145 mEq/L    Potassium 3.5  3.5 - 5.1 mEq/L    Chloride 99  96 - 112 mEq/L    CO2 27  19 - 32 mEq/L    Glucose, Bld 88  70 - 99 mg/dL    BUN 11  6 - 23 mg/dL    Creatinine, Ser 1.61  0.50 - 1.35 mg/dL    Calcium 9.2  8.4 - 09.6 mg/dL    GFR calc non Af Amer >90  >90 mL/min    GFR calc Af Amer >90  >90 mL/min    Ct Abdomen Pelvis W Contrast  07/08/2012  *RADIOLOGY REPORT*  Clinical Data: Left lower quadrant pain and diarrhea.  CT ABDOMEN AND PELVIS WITH CONTRAST  Technique:  Multidetector CT imaging of the abdomen and pelvis was performed following the standard protocol during bolus administration of intravenous contrast.  Contrast: OMNIPAQUE IOHEXOL 300 MG/ML  SOLN  Comparison: 12/30/2010  Findings: Atelectasis in the lung bases.  Technically limited study due to the patient's body habitus. Atelectasis in the lung bases.  There is inflammatory infiltrative  process in the right lower quadrant extending along the left pericolic gutter.  On the previous comparison study, the appendix is located in this area. The appendix is not specifically identified today.  Likely, this represents acute appendicitis with localized rupture and associated inflammatory edema.  No discrete abscess is demonstrated.  Is mild wall thickening in the adjacent terminal ileum which is likely reactive.  As visualized, the liver, spleen, gallbladder, pancreas, adrenal glands, abdominal aorta, and retroperitoneal lymph nodes are unremarkable.  There is a parenchymal cyst in the midportion of the left kidney.  No solid mass or hydronephrosis is identified in either kidney.  The stomach, small bowel, and colon are not abnormally distended.  No free air or free fluid in the abdomen.  Pelvis:  The prostate gland is not enlarged.  The bladder wall is not thickened.  No free or loculated pelvic fluid collections.  No significant pelvic lymphadenopathy.  Degenerative changes in the lumbar spine.  IMPRESSION: Inflammatory process in the right lower quadrant surrounding the location of the appendix on a prior study.  Although the appendix is not specifically identified, the appearance is likely represent acute appendicitis.  No discrete abscess.   Original Report Authenticated By: Burman Nieves, M.D.    Dg Abd Acute W/chest  07/07/2012  *RADIOLOGY REPORT*  Clinical Data: Bilateral flank pain  ACUTE ABDOMEN SERIES (ABDOMEN 2 VIEW & CHEST 1 VIEW)  Comparison: Chest radiographs dated 02/11/2010.  CT abdomen pelvis dated 12/30/2010.  Findings: Increased interstitial markings/bronchitic changes, chronic. Left basilar scarring/atelectasis.  No pleural effusion or pneumothorax.  Nonobstructive bowel gas pattern.  No evidence of free air under the diaphragm on the upright view.  Visualized osseous structures are within normal limits.  IMPRESSION: No evidence of acute cardiopulmonary disease.  No evidence of  small bowel obstruction or free air.  Original Report Authenticated By: Charline Bills, M.D.     Review of Systems  Constitutional: Positive for malaise/fatigue. Negative for fever and chills.  Cardiovascular: Negative for chest pain.  Gastrointestinal: Positive for abdominal pain and diarrhea. Negative for nausea and vomiting.  Genitourinary: Negative for dysuria, urgency and frequency.    Blood pressure 175/99, pulse 93, temperature 100 F (37.8 C), temperature source Oral, resp. rate 20, SpO2 97.00%. Physical Exam  Vitals reviewed. Constitutional: He appears well-developed and well-nourished.  Cardiovascular: Normal rate, regular rhythm and normal heart sounds.   Respiratory: Effort normal and breath sounds normal.  GI: Soft. Normal appearance and bowel sounds are normal. He exhibits no distension. There is tenderness (rlq greater than llq) in the right lower quadrant and left lower quadrant. There is no rebound. No hernia.     Assessment/Plan Likely localized perforation of appendix  His pain has been going on three days.  He has mildly elevated wbc and moderate pain on exam.  He is morbidly obese and does not want to undergo surgery as he has been told before he shouldn't be put to sleep by someone.  He is tearful when discussing surgery.  There is nothing pushing [place] to or immediately and I think conservative management with abx and npo for now is the best plan anyways.  I discussed the reasoning for this given appearance on ct as well as time course of disease.  I discussed the possible outcomes including worsening or not getting better over next 24-48 hours which would then prompt appendectomy or that he would improve and we would discuss interval appendectomy in the next couple months.  We also discussed he could have repeat ct and if larger abscess present could drain this.  He and his wife both voice understanding to this plan.  Maurice Nguyen 07/08/2012, 1:15 AM

## 2012-07-08 NOTE — Progress Notes (Signed)
Subjective:  Alert. Stable. Status normal. States pain is about the same as yesterday. No nausea or vomiting. No bowel movements.  Temp 9.0. Heart rate 92. BP 160/68. Objective: Vital signs in last 24 hours: Temp:  [98.4 F (36.9 C)-100 F (37.8 C)] 99 F (37.2 C) (12/14 0634) Pulse Rate:  [89-97] 92  (12/14 0634) Resp:  [18-24] 20  (12/14 0634) BP: (135-175)/(68-99) 160/68 mmHg (12/14 0634) SpO2:  [97 %-99 %] 97 % (12/14 0634) Weight:  [424 lb 9.7 oz (192.6 kg)] 424 lb 9.7 oz (192.6 kg) (12/14 0310)    Intake/Output from previous day: 12/13 0701 - 12/14 0700 In: 1000 [P.O.:1000] Out: 2 [Urine:2] Intake/Output this shift: Total I/O In: 1000 [P.O.:1000] Out: 2 [Urine:2]  General appearance: morbidly obese. Minimal distress. Flattened affect. Cooperative. GI: morbidly obese. Tender with guarding right lower quadrant seems fairly localized. No mass.  Lab Results:   [facility] 07/07/12 2004  WBC 12.4*  HGB 13.4  HCT 40.1  PLT 198   BMET  Basename 07/07/12 2004  NA 135  K 3.5  CL 99  CO2 27  GLUCOSE 88  BUN 11  CREATININE 0.96  CALCIUM 9.2   PT/INR No results found for this basename: LABPROT:2,INR:2 in the last 72 hours ABG No results found for this basename: PHART:2,PCO2:2,PO2:2,HCO3:2 in the last 72 hours  Studies/Results: Ct Abdomen Pelvis W Contrast  07/08/2012  *RADIOLOGY REPORT*  Clinical Data: Left lower quadrant pain and diarrhea.  CT ABDOMEN AND PELVIS WITH CONTRAST  Technique:  Multidetector CT imaging of the abdomen and pelvis was performed following the standard protocol during bolus administration of intravenous contrast.  Contrast: OMNIPAQUE IOHEXOL 300 MG/ML  SOLN  Comparison: 12/30/2010  Findings: Atelectasis in the lung bases.  Technically limited study due to the patient's body habitus. Atelectasis in the lung bases.  There is inflammatory infiltrative process in the right lower quadrant extending along the left pericolic gutter.  On the  previous comparison study, the appendix is located in this area. The appendix is not specifically identified today.  Likely, this represents acute appendicitis with localized rupture and associated inflammatory edema.  No discrete abscess is demonstrated.  Is mild wall thickening in the adjacent terminal ileum which is likely reactive.  As visualized, the liver, spleen, gallbladder, pancreas, adrenal glands, abdominal aorta, and retroperitoneal lymph nodes are unremarkable.  There is a parenchymal cyst in the midportion of the left kidney.  No solid mass or hydronephrosis is identified in either kidney.  The stomach, small bowel, and colon are not abnormally distended.  No free air or free fluid in the abdomen.  Pelvis:  The prostate gland is not enlarged.  The bladder wall is not thickened.  No free or loculated pelvic fluid collections.  No significant pelvic lymphadenopathy.  Degenerative changes in the lumbar spine.  IMPRESSION: Inflammatory process in the right lower quadrant surrounding the location of the appendix on a prior study.  Although the appendix is not specifically identified, the appearance is likely represent acute appendicitis.  No discrete abscess.   Original Report Authenticated By: Burman Nieves, M.D.    Dg Abd Acute W/chest  07/07/2012  *RADIOLOGY REPORT*  Clinical Data: Bilateral flank pain  ACUTE ABDOMEN SERIES (ABDOMEN 2 VIEW & CHEST 1 VIEW)  Comparison: Chest radiographs dated 02/11/2010.  CT abdomen pelvis dated 12/30/2010.  Findings: Increased interstitial markings/bronchitic changes, chronic. Left basilar scarring/atelectasis.  No pleural effusion or pneumothorax.  Nonobstructive bowel gas pattern.  No evidence of free air under  the diaphragm on the upright view.  Visualized osseous structures are within normal limits.  IMPRESSION: No evidence of acute cardiopulmonary disease.  No evidence of small bowel obstruction or free air.   Original Report Authenticated By: Charline Bills, M.D.     Anti-infectives: Anti-infectives     Start     Dose/Rate Route Frequency Ordered Stop   07/08/12 0300  piperacillin-tazobactam (ZOSYN) IVPB 3.375 g       3.375 g 12.5 mL/hr over 240 Minutes Intravenous 3 times per day 07/08/12 0246            Assessment/Plan:  Inflammatory phlegmon or LQ, likely localized perforation of appendix. No abscess by CT. Admitted for treatment with IV antibiotics. Patient is aware that if he fails to respond he may need appendectomy this admission. If he responds interval appendectomy in the next 2-3 months.  Encourage ambulation. He states he's on disability for back problems and will have trouble with that.  VTE  prophylaxis. On subcutaneous heparin.  IV hydration  Check labs tomorrow   LOS: 1 day    Wilfrido Luedke M. Derrell Lolling, M.D., [facility] General and Minimally invasive Surgery Breast and Colorectal Surgery Office:   [phone] Pager:   [phone]  07/08/2012

## 2012-07-09 LAB — CBC
HCT: 36.3 % — ABNORMAL LOW (ref 39.0–52.0)
Hemoglobin: 11.8 g/dL — ABNORMAL LOW (ref 13.0–17.0)
MCH: 30.4 pg (ref 26.0–34.0)
MCHC: 32.5 g/dL (ref 30.0–36.0)
MCV: 93.6 fL (ref 78.0–100.0)
Platelets: 199 10*3/uL (ref 150–400)
RBC: 3.88 MIL/uL — ABNORMAL LOW (ref 4.22–5.81)
RDW: 13.3 % (ref 11.5–15.5)
WBC: 8.1 10*3/uL (ref 4.0–10.5)

## 2012-07-09 LAB — BASIC METABOLIC PANEL
BUN: 8 mg/dL (ref 6–23)
CO2: 23 mEq/L (ref 19–32)
Calcium: 8.3 mg/dL — ABNORMAL LOW (ref 8.4–10.5)
Chloride: 101 mEq/L (ref 96–112)
Creatinine, Ser: 0.8 mg/dL (ref 0.50–1.35)
GFR calc Af Amer: >90 mL/min (ref >90)
GFR calc non Af Amer: >90 mL/min (ref >90)
Glucose, Bld: 98 mg/dL (ref 70–99)
Potassium: 3.7 mEq/L (ref 3.5–5.1)
Sodium: 136 mEq/L (ref 135–145)

## 2012-07-09 LAB — URINE CULTURE: Colony Count: 6000

## 2012-07-09 LAB — GLUCOSE, CAPILLARY
Glucose-Capillary: 96 mg/dL (ref 70–99)
Glucose-Capillary: 132 mg/dL — ABNORMAL HIGH (ref 70–99)

## 2012-07-09 MED ORDER — SODIUM CHLORIDE 0.9 % IV SOLN
INTRAVENOUS | Status: DC
  Administered 2012-07-09 – 2012-07-11 (×5): via INTRAVENOUS
  Filled 2012-07-09 (×12): qty 1000

## 2012-07-09 NOTE — Progress Notes (Signed)
Subjective:  He still has RLQ pain but is hungry and would like to eat regular food. No stool or flatus.  No fever or tachycardia.WBC has normalized to 8100. Objective: Vital signs in last 24 hours: Temp:  [98.5 F (36.9 C)-98.9 F (37.2 C)] 98.8 F (37.1 C) (12/15 0554) Pulse Rate:  [91-99] 93  (12/15 0554) Resp:  [18-20] 18  (12/15 0554) BP: (138-167)/(72-94) 156/83 mmHg (12/15 0554) SpO2:  [95 %-99 %] 97 % (12/15 0554) Last BM Date: 07/06/12  Intake/Output from previous day: 12/14 0701 - 12/15 0700 In: -  Out: 4 [Urine:4] Intake/Output this shift:    General appearance: alert. Mental status normal. Very talkative. In no distress. Lots of questions. Family with him. GI: abdomen morbidly obese. Still tender in right lower quadrant but much less, also guarding much less. Not distended.  Lab Results:   [facility] 07/09/12 0740 07/08/12 0620  WBC 8.1 10.8*  HGB 11.8* 12.1*  HCT 36.3* 36.3*  PLT 199 197   BMET  Basename 07/09/12 0740 07/08/12 0620  NA 136 136  K 3.7 3.4*  CL 101 101  CO2 23 25  GLUCOSE 98 88  BUN 8 10  CREATININE 0.80 0.81  CALCIUM 8.3* 8.6   PT/INR No results found for this basename: LABPROT:2,INR:2 in the last 72 hours ABG No results found for this basename: PHART:2,PCO2:2,PO2:2,HCO3:2 in the last 72 hours  Studies/Results: Ct Abdomen Pelvis W Contrast  07/08/2012  *RADIOLOGY REPORT*  Clinical Data: Left lower quadrant pain and diarrhea.  CT ABDOMEN AND PELVIS WITH CONTRAST  Technique:  Multidetector CT imaging of the abdomen and pelvis was performed following the standard protocol during bolus administration of intravenous contrast.  Contrast: OMNIPAQUE IOHEXOL 300 MG/ML  SOLN  Comparison: 12/30/2010  Findings: Atelectasis in the lung bases.  Technically limited study due to the patient's body habitus. Atelectasis in the lung bases.  There is inflammatory infiltrative process in the right lower quadrant extending along the left pericolic  gutter.  On the previous comparison study, the appendix is located in this area. The appendix is not specifically identified today.  Likely, this represents acute appendicitis with localized rupture and associated inflammatory edema.  No discrete abscess is demonstrated.  Is mild wall thickening in the adjacent terminal ileum which is likely reactive.  As visualized, the liver, spleen, gallbladder, pancreas, adrenal glands, abdominal aorta, and retroperitoneal lymph nodes are unremarkable.  There is a parenchymal cyst in the midportion of the left kidney.  No solid mass or hydronephrosis is identified in either kidney.  The stomach, small bowel, and colon are not abnormally distended.  No free air or free fluid in the abdomen.  Pelvis:  The prostate gland is not enlarged.  The bladder wall is not thickened.  No free or loculated pelvic fluid collections.  No significant pelvic lymphadenopathy.  Degenerative changes in the lumbar spine.  IMPRESSION: Inflammatory process in the right lower quadrant surrounding the location of the appendix on a prior study.  Although the appendix is not specifically identified, the appearance is likely represent acute appendicitis.  No discrete abscess.   Original Report Authenticated By: Burman Nieves, M.D.    Dg Abd Acute W/chest  07/07/2012  *RADIOLOGY REPORT*  Clinical Data: Bilateral flank pain  ACUTE ABDOMEN SERIES (ABDOMEN 2 VIEW & CHEST 1 VIEW)  Comparison: Chest radiographs dated 02/11/2010.  CT abdomen pelvis dated 12/30/2010.  Findings: Increased interstitial markings/bronchitic changes, chronic. Left basilar scarring/atelectasis.  No pleural effusion or pneumothorax.  Nonobstructive  bowel gas pattern.  No evidence of free air under the diaphragm on the upright view.  Visualized osseous structures are within normal limits.  IMPRESSION: No evidence of acute cardiopulmonary disease.  No evidence of small bowel obstruction or free air.   Original Report Authenticated By:  Charline Bills, M.D.     Anti-infectives: Anti-infectives     Start     Dose/Rate Route Frequency Ordered Stop   07/08/12 0300  piperacillin-tazobactam (ZOSYN) IVPB 3.375 g       3.375 g 12.5 mL/hr over 240 Minutes Intravenous 3 times per day 07/08/12 0246            Assessment/Plan:  HD #2 Inflammatory phlegmon RLQ, likely localized perforation of appendix. No abscess by CT. Physical exam improved. Leukocytosis has resolved, absence of fever suggests initial response to IV antibiotics. Patient is aware that if he fails to respond he may need appendectomy this admission. If he responds interval appendectomy in the next 2-3 months.   Begin diet  Encourage ambulation. He states he's on disability for back problems and will have trouble with that.  VTE prophylaxis. On subcutaneous heparin.  IV hydration    LOS: 2 days    Antionio Negron M. Derrell Lolling, M.D., [facility] General and Minimally invasive Surgery Breast and Colorectal Surgery Office:   [phone] Pager:   [phone]  07/09/2012

## 2012-07-10 LAB — CBC
HCT: 35.5 % — ABNORMAL LOW (ref 39.0–52.0)
Hemoglobin: 11.7 g/dL — ABNORMAL LOW (ref 13.0–17.0)
MCH: 30.8 pg (ref 26.0–34.0)
MCHC: 33 g/dL (ref 30.0–36.0)
MCV: 93.4 fL (ref 78.0–100.0)
Platelets: 219 10*3/uL (ref 150–400)
RBC: 3.8 MIL/uL — ABNORMAL LOW (ref 4.22–5.81)
RDW: 13.3 % (ref 11.5–15.5)
WBC: 8.3 10*3/uL (ref 4.0–10.5)

## 2012-07-10 LAB — GLUCOSE, CAPILLARY
Glucose-Capillary: 91 mg/dL (ref 70–99)
Glucose-Capillary: 148 mg/dL — ABNORMAL HIGH (ref 70–99)
Glucose-Capillary: 149 mg/dL — ABNORMAL HIGH (ref 70–99)
Glucose-Capillary: 134 mg/dL — ABNORMAL HIGH (ref 70–99)
Glucose-Capillary: 142 mg/dL — ABNORMAL HIGH (ref 70–99)
Glucose-Capillary: 165 mg/dL — ABNORMAL HIGH (ref 70–99)
Glucose-Capillary: 143 mg/dL — ABNORMAL HIGH (ref 70–99)

## 2012-07-10 MED ORDER — PANTOPRAZOLE SODIUM 40 MG PO TBEC
40.0000 mg | DELAYED_RELEASE_TABLET | Freq: Every day | ORAL | Status: DC
  Administered 2012-07-10 – 2012-07-12 (×3): 40 mg via ORAL
  Filled 2012-07-10 (×3): qty 1

## 2012-07-10 NOTE — Progress Notes (Signed)
Patient interviewed and examined, agree with PA note above. He states his pain is unchanged. He does not appear at all ill. White count is normal. I had a long discussion regarding pros and cons of continuing nonoperative management. Overall he is stable to slightly improved and I recommended continuing our current plan and repeating his CT scan in 2 days as long as he is not getting worse.  Mariella Saa MD, FACS  07/10/2012 7:16 PM

## 2012-07-10 NOTE — Progress Notes (Signed)
  Subjective: Sitting up at bedside in chair, still with c/o rlq abdominal pain and is + tender to palpation on exam. Does not appear to be in any acute distress however.  Objective: Vital signs in last 24 hours: Temp:  [97.9 F (36.6 C)-99.8 F (37.7 C)] 97.9 F (36.6 C) (12/16 0640) Pulse Rate:  [88-92] 92  (12/16 0640) Resp:  [16-21] 21  (12/16 0640) BP: (141-187)/(84-87) 152/86 mmHg (12/16 0640) SpO2:  [96 %-99 %] 96 % (12/16 0640) Last BM Date: 07/09/12  Intake/Output from previous day: 12/15 0701 - 12/16 0700 In: 3235 [P.O.:1080; I.V.:2155] Out: 7 [Urine:7] Intake/Output this shift: Total I/O In: 360 [P.O.:360] Out: -   General appearance: alert, cooperative, appears stated age and morbidly obese Abdomen: c/o RLQ tenderness;, no N/V/D. Labs: WBC trended up slightly but still wnl, H&H stable, BUN and Creatine improved. VSS, afebrile.  Lab Results:   [facility] 07/10/12 0720 07/09/12 0740  WBC 8.3 8.1  HGB 11.7* 11.8*  HCT 35.5* 36.3*  PLT 219 199   BMET  Basename 07/09/12 0740 07/08/12 0620  NA 136 136  K 3.7 3.4*  CL 101 101  CO2 23 25  GLUCOSE 98 88  BUN 8 10  CREATININE 0.80 0.81  CALCIUM 8.3* 8.6   PT/INR No results found for this basename: LABPROT:2,INR:2 in the last 72 hours ABG No results found for this basename: PHART:2,PCO2:2,PO2:2,HCO3:2 in the last 72 hours  Studies/Results: No results found.  Anti-infectives: Anti-infectives     Start     Dose/Rate Route Frequency Ordered Stop   07/08/12 0300   piperacillin-tazobactam (ZOSYN) IVPB 3.375 g        3.375 g 12.5 mL/hr over 240 Minutes Intravenous 3 times per day 07/08/12 0246            Assessment/Plan: s/p * No surgery found * Inflammatory phlegmon or LQ, likely localized perforation of appendix. No abscess by CT.  Admitted for treatment with IV antibiotics.  Encourage ambulation. He states he's on disability for back problems and will have trouble with that.  VTE prophylaxis.  On subcutaneous heparin.  IV hydration  Check labs tomorrow   LOS: 3 days    Maurice Nguyen 07/10/2012

## 2012-07-11 ENCOUNTER — Encounter (INDEPENDENT_AMBULATORY_CARE_PROVIDER_SITE_OTHER): Payer: Self-pay | Admitting: General Surgery

## 2012-07-11 LAB — GLUCOSE, CAPILLARY
Glucose-Capillary: 159 mg/dL — ABNORMAL HIGH (ref 70–99)
Glucose-Capillary: 165 mg/dL — ABNORMAL HIGH (ref 70–99)
Glucose-Capillary: 122 mg/dL — ABNORMAL HIGH (ref 70–99)
Glucose-Capillary: 147 mg/dL — ABNORMAL HIGH (ref 70–99)

## 2012-07-11 MED ORDER — HYDRALAZINE HCL 20 MG/ML IJ SOLN
20.0000 mg | INTRAMUSCULAR | Status: DC | PRN
  Administered 2012-07-11 – 2012-07-12 (×4): 20 mg via INTRAVENOUS
  Filled 2012-07-11 (×3): qty 1

## 2012-07-11 MED ORDER — DIPHENHYDRAMINE HCL 25 MG PO CAPS
25.0000 mg | ORAL_CAPSULE | Freq: Once | ORAL | Status: AC
  Administered 2012-07-11: 25 mg via ORAL
  Filled 2012-07-11: qty 1

## 2012-07-11 NOTE — Progress Notes (Signed)
Patient ID: Maurice Hiss., male   DOB: 1966-04-23, 46 y.o.   MRN: [id number]    Subjective: Sitting up at bedside in chair, still with c/o rlq abdominal pain but this is much improved over yesterday. Patient states that he was able to sleep last night.   Objective: Vital signs in last 24 hours: Temp:  [98 F (36.7 C)-98.8 F (37.1 C)] 98 F (36.7 C) (12/17 0514) Pulse Rate:  [84-109] 109  (12/17 0514) Resp:  [18-20] 18  (12/17 0514) BP: (145-194)/(73-98) 157/73 mmHg (12/17 0514) SpO2:  [96 %-99 %] 96 % (12/17 0514) Last BM Date: 07/09/12  Intake/Output from previous day: 12/16 0701 - 12/17 0700 In: 5182.5 [P.O.:600; I.V.:4032.5; IV Piggyback:550] Out: 2 [Urine:2] Intake/Output this shift:    General appearance: alert, cooperative, appears stated age and morbidly obese Abdomen: c/o RLQ tenderness;(improved) no N/V/D. VSS, afebrile.  Lab Results:   [facility] 07/10/12 0720 07/09/12 0740  WBC 8.3 8.1  HGB 11.7* 11.8*  HCT 35.5* 36.3*  PLT 219 199   BMET  Basename 07/09/12 0740  NA 136  K 3.7  CL 101  CO2 23  GLUCOSE 98  BUN 8  CREATININE 0.80  CALCIUM 8.3*   PT/INR No results found for this basename: LABPROT:2,INR:2 in the last 72 hours ABG No results found for this basename: PHART:2,PCO2:2,PO2:2,HCO3:2 in the last 72 hours  Studies/Results: No results found.  Anti-infectives: Anti-infectives     Start     Dose/Rate Route Frequency Ordered Stop   07/08/12 0300   piperacillin-tazobactam (ZOSYN) IVPB 3.375 g        3.375 g 12.5 mL/hr over 240 Minutes Intravenous 3 times per day 07/08/12 0246            Assessment/Plan: s/p * No surgery found * Inflammatory phlegmon or LQ, likely localized perforation of appendix. No abscess by CT.   Plan:  1. Continue IV abx 2. Repeat CT of abdomen and pelvis in am 3. Encourage ambulation/OOB 4. Continue to follow clinical presentation   LOS: 4 days    Maurice Nguyen [facility]  Surgery Pager # [phone]  07/11/2012

## 2012-07-11 NOTE — Progress Notes (Signed)
0230 Patient's b/p 194/87. Dr. Donell Beers notified of elevated b/p orders received. 0300 Hydralazine 20mg  given IV. Will continue to monitor.

## 2012-07-11 NOTE — Progress Notes (Signed)
Patient interviewed and examined, agree with NP note above.  Mariella Saa MD, FACS  07/11/2012 7:06 PM

## 2012-07-12 ENCOUNTER — Inpatient Hospital Stay (HOSPITAL_COMMUNITY): Payer: Medicare Other

## 2012-07-12 ENCOUNTER — Encounter (HOSPITAL_COMMUNITY): Payer: Self-pay | Admitting: Radiology

## 2012-07-12 LAB — GLUCOSE, CAPILLARY
Glucose-Capillary: 155 mg/dL — ABNORMAL HIGH (ref 70–99)
Glucose-Capillary: 126 mg/dL — ABNORMAL HIGH (ref 70–99)
Glucose-Capillary: 131 mg/dL — ABNORMAL HIGH (ref 70–99)
Glucose-Capillary: 150 mg/dL — ABNORMAL HIGH (ref 70–99)

## 2012-07-12 MED ORDER — IOHEXOL 300 MG/ML  SOLN
100.0000 mL | Freq: Once | INTRAMUSCULAR | Status: AC | PRN
  Administered 2012-07-12: 100 mL via INTRAVENOUS

## 2012-07-12 NOTE — Progress Notes (Signed)
Patient interviewed and examined, agree with NP note above. CT shows slight improvement.  He is now asymptomatic and non tender I think he is doing well with non operative  Management.  Discussed options and findings, plans, with patient and family.  Likely home tomorrow on oral abx Mariella Saa MD, FACS  07/12/2012 5:39 PM

## 2012-07-12 NOTE — Progress Notes (Signed)
Patient ID: Randall Hiss., male   DOB: 08-Oct-1965, 46 y.o.   MRN: [id number]    Subjective: Sitting up at bedside in chair, anxious concerning CT results.  No c/o of abdominal pain today. Has been tolerating diet. Multiple family members at bedside.  Objective: Vital signs in last 24 hours: Temp:  [97.5 F (36.4 C)-98.7 F (37.1 C)] 98 F (36.7 C) (12/18 0552) Pulse Rate:  [83-97] 96  (12/18 0552) Resp:  [20] 20  (12/18 0552) BP: (143-180)/(67-93) 143/68 mmHg (12/18 0552) SpO2:  [95 %-99 %] 99 % (12/18 0552) Last BM Date: 07/09/12  Intake/Output from previous day: 12/17 0701 - 12/18 0700 In: 1825 [P.O.:480; I.V.:1195; IV Piggyback:150] Out: 3 [Urine:3] Intake/Output this shift:    General appearance: alert, cooperative, appears stated age and morbidly obese Abdomen: obese, soft, non tender. + BS, No N/V/D. CT done 07/12/12: IMPRESSION:  1. No significant change in the retrocecal inflammatory process  consistent with ruptured appendicitis. No drainable abscess  identified.  2. No evidence of bowel obstruction or extravasated enteric  contrast.  3. Cannot exclude distal rectal wall thickening. Correlation with  digital rectal examination recommended.  Lab Results:   [facility] 07/10/12 0720  WBC 8.3  HGB 11.7*  HCT 35.5*  PLT 219   BMET No results found for this basename: NA:2,K:2,CL:2,CO2:2,GLUCOSE:2,BUN:2,CREATININE:2,CALCIUM:2 in the last 72 hours PT/INR No results found for this basename: LABPROT:2,INR:2 in the last 72 hours ABG No results found for this basename: PHART:2,PCO2:2,PO2:2,HCO3:2 in the last 72 hours  Studies/Results: Ct Abdomen Pelvis W Contrast  07/12/2012  *RADIOLOGY REPORT*  Clinical Data: Ruptured appendicitis on antibiotics.  Low abdominal pain.  CT ABDOMEN AND PELVIS WITH CONTRAST  Technique:  Multidetector CT imaging of the abdomen and pelvis was performed following the standard protocol during bolus administration of intravenous  contrast.  Contrast: OMNIPAQUE IOHEXOL 300 MG/ML  SOLN  Comparison: Prior examinations 07/07/2012 and 12/30/2010.  Findings: Study remains mildly limited by body habitus.  There is stable mild atelectasis at both lung bases.  There is a small Bochdalek hernia on the right containing only fat.  No significant pleural effusion is present.  The liver, gallbladder, pancreas, biliary system and spleen appear normal.  There is no adrenal mass.  There is a stable small cyst in the mid left kidney.  The right kidney appears normal.  The right lower quadrant retrocecal inflammatory process does not appear significantly changed.  There is no extravasated enteric contrast or drainable fluid collection.  The soft tissue stranding extending more inferiorly along the right psoas muscle has improved.  There are no new inflammatory changes or fluid collections.  There is no evidence of bowel obstruction. The distal rectum is poorly distended; the rectal wall thickening is difficult to exclude.  The bladder, prostate gland and seminal vesicles appear normal. There are no worrisome osseous findings.  IMPRESSION:  1.  No significant change in the retrocecal inflammatory process consistent with ruptured appendicitis.  No drainable abscess identified. 2.  No evidence of bowel obstruction or extravasated enteric contrast. 3.  Cannot exclude distal rectal wall thickening.  Correlation with digital rectal examination recommended.   Original Report Authenticated By: Carey Bullocks, M.D.     Anti-infectives: Anti-infectives     Start     Dose/Rate Route Frequency Ordered Stop   07/08/12 0300   piperacillin-tazobactam (ZOSYN) IVPB 3.375 g        3.375 g 12.5 mL/hr over 240 Minutes Intravenous 3 times per day 07/08/12 0246  Assessment/Plan: s/p * No surgery found * Inflammatory phlegmon or LQ, likely localized perforation of appendix. No abscess by CT.   Plan:  1. Continue IV abx for now; WBC wnl (will  discuss with Dr. Johna Sheriff ? Dc or change to po.) CT results indicate no drainable fluid collection. Continue with conservative measures . 2. Encourage ambulation/OOB 3. Continue to follow clinical presentation   LOS: 5 days    Golda Acre [facility] Surgery Pager # [phone]  07/12/2012

## 2012-07-13 ENCOUNTER — Encounter (HOSPITAL_COMMUNITY): Payer: Self-pay | Admitting: General Surgery

## 2012-07-13 DIAGNOSIS — E119 Type 2 diabetes mellitus without complications: Secondary | ICD-10-CM

## 2012-07-13 DIAGNOSIS — I10 Essential (primary) hypertension: Secondary | ICD-10-CM

## 2012-07-13 DIAGNOSIS — K3532 Acute appendicitis with perforation and localized peritonitis, without abscess: Secondary | ICD-10-CM

## 2012-07-13 DIAGNOSIS — G8929 Other chronic pain: Secondary | ICD-10-CM

## 2012-07-13 HISTORY — DX: Essential (primary) hypertension: I10

## 2012-07-13 HISTORY — DX: Other chronic pain: G89.29

## 2012-07-13 HISTORY — DX: Type 2 diabetes mellitus without complications: E11.9

## 2012-07-13 HISTORY — DX: Morbid (severe) obesity due to excess calories: E66.01

## 2012-07-13 HISTORY — DX: Acute appendicitis with perforation, localized peritonitis, and gangrene, without abscess: K35.32

## 2012-07-13 LAB — GLUCOSE, CAPILLARY: Glucose-Capillary: 197 mg/dL — ABNORMAL HIGH (ref 70–99)

## 2012-07-13 MED ORDER — OXYCODONE-ACETAMINOPHEN 5-325 MG PO TABS
1.0000 | ORAL_TABLET | ORAL | Status: DC | PRN
Start: 2012-07-13 — End: 2012-07-13

## 2012-07-13 MED ORDER — AMOXICILLIN-POT CLAVULANATE 875-125 MG PO TABS: 1.0000 | ORAL_TABLET | Freq: Two times a day (BID) | ORAL | Status: DC

## 2012-07-13 MED ORDER — AMOXICILLIN-POT CLAVULANATE 875-125 MG PO TABS
1.0000 | ORAL_TABLET | Freq: Two times a day (BID) | ORAL | Status: DC
  Filled 2012-07-13: qty 1

## 2012-07-13 MED ORDER — ACETAMINOPHEN 325 MG PO TABS: 650.0000 mg | ORAL_TABLET | Freq: Four times a day (QID) | ORAL | Status: DC | PRN

## 2012-07-13 MED ORDER — METFORMIN HCL 1000 MG PO TABS: 1000.0000 mg | ORAL_TABLET | Freq: Two times a day (BID) | ORAL | Status: DC

## 2012-07-13 MED ORDER — OXYCODONE-ACETAMINOPHEN 5-325 MG PO TABS: 1.0000 | ORAL_TABLET | ORAL | Status: DC | PRN

## 2012-07-13 NOTE — Progress Notes (Signed)
Subjective: He says he feels fine waiting on [place] to send him home.  Objective: Vital signs in last 24 hours: Temp:  [98.4 F (36.9 C)-98.5 F (36.9 C)] 98.4 F (36.9 C) (12/19 0557) Pulse Rate:  [87-95] 88  (12/19 0557) Resp:  [16-18] 18  (12/19 0557) BP: (149-166)/(61-87) 157/61 mmHg (12/19 0557) SpO2:  [99 %-100 %] 100 % (12/19 0557) Last BM Date: 07/11/12 +BM recorded yesterday, Diet: fat modified, afebrile, BP up to 180/91 yesterday, better this AM. No labs since 07/10/12  Intake/Output from previous day: 12/18 0701 - 12/19 0700 In: 245 [I.V.:245] Out: -  Intake/Output this shift:    General appearance: alert, cooperative, no distress and sitting up in chair. GI: soft, non-tender; bowel sounds normal; no masses,  no organomegaly  Lab Results:  No results found for this basename: WBC:2,HGB:2,HCT:2,PLT:2 in the last 72 hours  BMET No results found for this basename: NA:2,K:2,CL:2,CO2:2,GLUCOSE:2,BUN:2,CREATININE:2,CALCIUM:2 in the last 72 hours PT/INR No results found for this basename: LABPROT:2,INR:2 in the last 72 hours  No results found for this basename: AST:5,ALT:5,ALKPHOS:5,BILITOT:5,PROT:5,ALBUMIN:5 in the last 168 hours   Lipase     Component Value Date/Time   LIPASE 33 12/30/2010 2044     Studies/Results: Ct Abdomen Pelvis W Contrast  07/12/2012  *RADIOLOGY REPORT*  Clinical Data: Ruptured appendicitis on antibiotics.  Low abdominal pain.  CT ABDOMEN AND PELVIS WITH CONTRAST  Technique:  Multidetector CT imaging of the abdomen and pelvis was performed following the standard protocol during bolus administration of intravenous contrast.  Contrast: OMNIPAQUE IOHEXOL 300 MG/ML  SOLN  Comparison: Prior examinations 07/07/2012 and 12/30/2010.  Findings: Study remains mildly limited by body habitus.  There is stable mild atelectasis at both lung bases.  There is a small Bochdalek hernia on the right containing only fat.  No significant pleural effusion is  present.  The liver, gallbladder, pancreas, biliary system and spleen appear normal.  There is no adrenal mass.  There is a stable small cyst in the mid left kidney.  The right kidney appears normal.  The right lower quadrant retrocecal inflammatory process does not appear significantly changed.  There is no extravasated enteric contrast or drainable fluid collection.  The soft tissue stranding extending more inferiorly along the right psoas muscle has improved.  There are no new inflammatory changes or fluid collections.  There is no evidence of bowel obstruction. The distal rectum is poorly distended; the rectal wall thickening is difficult to exclude.  The bladder, prostate gland and seminal vesicles appear normal. There are no worrisome osseous findings.  IMPRESSION:  1.  No significant change in the retrocecal inflammatory process consistent with ruptured appendicitis.  No drainable abscess identified. 2.  No evidence of bowel obstruction or extravasated enteric contrast. 3.  Cannot exclude distal rectal wall thickening.  Correlation with digital rectal examination recommended.   Original Report Authenticated By: Carey Bullocks, M.D.     Medications:    . heparin  5,000 Units Subcutaneous Q8H  . insulin aspart  0-15 Units Subcutaneous TID WC  . pantoprazole  40 mg Oral QHS  . piperacillin-tazobactam (ZOSYN)  IV  3.375 g Intravenous Q8H    Assessment/Plan Likely localized perforation of appendix, with inflammatory Phlegmon Diabetes mellitus  Lumbar herniated disc/ on disability for back pain    Body mass index is 54.5 Repeat CT 07/12/12 shows slight improvement  Plan:  Home on Augmentin 14 more days and follow up with Dr. Johna Sheriff in 2 week.     LOS:  6 days    Ajai Terhaar 07/13/2012

## 2012-07-13 NOTE — Discharge Summary (Signed)
Physician Discharge Summary  Patient ID: Maurice Nguyen. MRN: [id number] DOB/AGE: 1965-09-14 46 y.o.  Admit date: 07/07/2012 Discharge date: 07/13/2012  Admission Diagnoses: Likely localized perforation of appendix, with inflammatory Phlegmon Diabetes mellitus  Lumbar herniated disc/ on disability for back pain    Body mass index is 54.5 Hypertension  Discharge Diagnosis:   Likely localized perforation of appendix, with inflammatory Phlegmon  Diabetes mellitus  Lumbar herniated disc/ on disability for back pain    Body mass index is 54.5  Repeat CT 07/12/12 shows slight improvement gnoses:   Principal Problem:  *Perforated appendix Active Problems:  Diabetes mellitus  Obesity, morbid, BMI 50 or higher  Hypertension  Back pain, chronic   PROCEDURES: none  Hospital Course: 50 yom who is morbidly obese and was recently evaluated by Dr. Bosie Clos for loose stools and some difficulty with control. On Tuesday he began having bilateral lower quadrant abdominal pain. This is worse with movement and palpation. Better when lying still. There has been no real change in pain since then. He came to er tonight due to persistence of pain. He is passing flatus but no bm last couple days. Denies n/v. No fevers. No urinary symptoms. He is anorectic. Ct obtained on 07/08/12 shows a localized perforation of the appendix.  He was admitted and placed on antibiotics. Repeat Ct scan shows slight improvement. He is now asymptomatic and non tender.  He was continued on antibiotics and continues to do well. It is  Dr. Jamse Mead opinion pt can go home on 14 more days of antibiotics.  He will follow up in the office, in two week.  We resumed his home medicines as before and he is to call Dr. Bruna Potter for control of his BP and glucose.  Condition on D/C: Improving   Disposition: 01-Home or Self Care     Medication List     As of 07/13/2012 10:26 AM    TAKE these medications        acetaminophen 325 MG tablet   Commonly known as: TYLENOL   Take 2 tablets (650 mg total) by mouth every 6 (six) hours as needed (or Temp > 100).      amoxicillin-clavulanate 875-125 MG per tablet   Commonly known as: AUGMENTIN   Take 1 tablet by mouth every 12 (twelve) hours.      glipiZIDE 10 MG tablet   Commonly known as: GLUCOTROL   Take 10 mg by mouth 2 (two) times daily before a meal.      metFORMIN 1000 MG tablet   Commonly known as: GLUCOPHAGE   Take 1 tablet (1,000 mg total) by mouth 2 (two) times daily with a meal.      naproxen sodium 220 MG tablet   Commonly known as: ANAPROX   Take 440 mg by mouth daily as needed. For pain      oxyCODONE-acetaminophen 5-325 MG per tablet   Commonly known as: PERCOCET/ROXICET   Take 1-2 tablets by mouth every 4 (four) hours as needed.        Follow-up Information    Follow up with HOXWORTH,BENJAMIN T, MD. Schedule an appointment as soon as possible for a visit in 2 weeks.   Contact information:   [address] [place] [address] [phone]       Call Burtis Junes, MD. (Let Dr. Bruna Potter follow up on your blood pressure and glucose control.  )    Contact information:   [facility] [address] [address] [facility]  [place] [address] [phone]          Signed: Sherrie George 07/13/2012, 10:26 AM

## 2012-07-13 NOTE — Progress Notes (Signed)
Chart review complete.  Patient is not eligible for [facility] Care Management services because his PCP is not a [facility] primary care provider or is not [facility] affiliated.  For any additional questions or new referrals please contact Tim Henderson BSN RN [facility] Liaison at [phone] °

## 2012-07-13 NOTE — Progress Notes (Signed)
DC home, verbally understood dc instructions, no questions asked

## 2012-08-03 ENCOUNTER — Ambulatory Visit (INDEPENDENT_AMBULATORY_CARE_PROVIDER_SITE_OTHER): Payer: Medicare Other | Admitting: General Surgery

## 2012-08-03 ENCOUNTER — Encounter (HOSPITAL_COMMUNITY): Payer: Self-pay | Admitting: *Deleted

## 2012-08-03 ENCOUNTER — Encounter (INDEPENDENT_AMBULATORY_CARE_PROVIDER_SITE_OTHER): Payer: Self-pay | Admitting: General Surgery

## 2012-08-03 VITALS — BP 150/92 | HR 107 | Temp 97.7°F | Resp 18 | Ht 73.0 in | Wt >= 6400 oz

## 2012-08-03 DIAGNOSIS — K352 Acute appendicitis with generalized peritonitis, without abscess: Secondary | ICD-10-CM

## 2012-08-03 DIAGNOSIS — K3532 Acute appendicitis with perforation and localized peritonitis, without abscess: Secondary | ICD-10-CM

## 2012-08-03 MED ORDER — OXYCODONE-ACETAMINOPHEN 5-325 MG PO TABS: 1.0000 | ORAL_TABLET | ORAL | Status: DC | PRN

## 2012-08-03 MED ORDER — AMOXICILLIN-POT CLAVULANATE 875-125 MG PO TABS: 1.0000 | ORAL_TABLET | Freq: Two times a day (BID) | ORAL | Status: DC

## 2012-08-03 NOTE — Progress Notes (Signed)
Chief complaint: Followup perforated appendicitis  History: Patient returns to the office with a recent hospitalization from December 13 2 07/13/2012 when he presented with bilateral lower quadrant abdominal pain and CT scan revealed a right lower quadrant phlegmon with evidence of perforated appendicitis. It was elected at that time to treat him nonoperatively. He clinically improved and was discharged on 2 weeks of Augmentin. He states that he gradually got better on the Augmentin but since stopping it has had no further improvement and continues to have mild right lower quadrant abdominal pain. This is definitely better than when he is in the hospital. No fever chills. No nausea or vomiting.  Exam: BP 150/92  Pulse 107  Temp 97.7 F (36.5 C) (Oral)  Resp 18  Ht 6\' 1"  (1.854 m)  Wt 419 lb 12.8 oz (190.42 kg)  BMI 55.39 kg/m2 General: Morbid obese but otherwise no distress Abdomen: There is localized to moderate right mid abdominal tenderness without guarding or apparent mass.  Assessment and plan: Recent episode of perforated appendicitis with phlegmon managed nonoperatively. He is improved clinically not resolved. I'm going to give him 2 more weeks of Augmentin. His Percocet his refills. I will see him back in 3 weeks and I would consider a repeat CT scan at that time. He may well eventually require appendectomy. We discussed this today all his questions were answered. I encouraged him to call should he experience any increasing pain or fever or other concerns.

## 2012-08-03 NOTE — Pre-Procedure Instructions (Signed)
Your procedure is scheduled MV:HQIONGE, August 22, 2012 Report to [facility] Admitting [id number] Call this number if you have problems morning of your procedure:[phone]  Follow all bowel prep instructions per your doctor's orders.  Do not eat or drink anything after midnight the night before your procedure. You may brush your teeth, rinse out your mouth, but no water, no food, no chewing gum, no mints, no candies, no chewing tobacco.     Take these medicines the morning of your procedure with A SIP OF WATER:Amoxicillin and may take Oxycodone if needed   Please make arrangements for a responsible person to drive you home after the procedure. You cannot go home by cab/taxi. We recommend you have someone with you at home the first 24 hours after your procedure. Driver for procedure is will let us know the day of the procedure.  Told that he would not be able to drive himself home after the procedure.  LEAVE ALL VALUABLES, JEWELRY, BILLFOLD AT HOME.  NO DENTURES, CONTACT LENSES ALLOWED IN THE ENDOSCOPY ROOM.   YOU MAY WEAR DEODORANT, PLEASE REMOVE ALL JEWELRY, WATCHES RINGS, BODY PIERCINGS AND LEAVE AT HOME.   WOMEN: NO MAKE-UP, LOTIONS PERFUMES Your procedure is scheduled on: Report to [facility] Admitting at: Call this number if you have problems morning of your procedure:[phone]  Follow all bowel prep instructions per your doctor's orders.  Do not eat or drink anything after midnight the night before your procedure. You may brush your teeth, rinse out your mouth, but no water, no food, no chewing gum, no mints, no candies, no chewing tobacco.     Take these medicines the morning of your procedure with A SIP OF WATER:   Please make arrangements for a responsible person to drive you home after the procedure. You cannot go home by cab/taxi. We recommend you have someone with you at home the first 24 hours after your procedure. Driver for procedure is  LEAVE ALL VALUABLES,  JEWELRY, BILLFOLD AT HOME.  NO DENTURES, CONTACT LENSES ALLOWED IN THE ENDOSCOPY ROOM.   YOU MAY WEAR DEODORANT, PLEASE REMOVE ALL JEWELRY, WATCHES RINGS, BODY PIERCINGS AND LEAVE AT HOME.   WOMEN: NO MAKE-UP, LOTIONS PERFUMES

## 2012-08-03 NOTE — Patient Instructions (Addendum)
Call if you're having worse abdominal pain or fever or other concerns and otherwise we will see you in 3 weeks

## 2012-08-04 ENCOUNTER — Encounter (HOSPITAL_COMMUNITY): Payer: Self-pay | Admitting: Pharmacy Technician

## 2012-08-22 ENCOUNTER — Observation Stay (HOSPITAL_COMMUNITY)
Admission: EM | Admit: 2012-08-22 | Discharge: 2012-08-24 | Disposition: A | Discharge status: Home / Self Care | Payer: Medicare Other | Source: Home / Self Care | Attending: Internal Medicine | Admitting: Internal Medicine

## 2012-08-22 ENCOUNTER — Encounter (HOSPITAL_COMMUNITY): Payer: Self-pay | Admitting: *Deleted

## 2012-08-22 ENCOUNTER — Ambulatory Visit (HOSPITAL_COMMUNITY)
Admission: RE | Admit: 2012-08-22 | Discharge: 2012-08-22 | Disposition: A | Discharge status: Home / Self Care | Payer: Medicare Other | Source: Ambulatory Visit | Attending: Gastroenterology | Admitting: Gastroenterology

## 2012-08-22 ENCOUNTER — Encounter (HOSPITAL_COMMUNITY): Payer: Self-pay | Admitting: Emergency Medicine

## 2012-08-22 ENCOUNTER — Encounter (HOSPITAL_COMMUNITY)
Admission: RE | Disposition: A | Discharge status: Home / Self Care | Payer: Self-pay | Source: Ambulatory Visit | Attending: Gastroenterology

## 2012-08-22 ENCOUNTER — Encounter (HOSPITAL_COMMUNITY): Payer: Self-pay | Admitting: Anesthesiology

## 2012-08-22 ENCOUNTER — Emergency Department (HOSPITAL_COMMUNITY): Payer: Medicare Other

## 2012-08-22 DIAGNOSIS — R197 Diarrhea, unspecified: Secondary | ICD-10-CM | POA: Insufficient documentation

## 2012-08-22 DIAGNOSIS — E119 Type 2 diabetes mellitus without complications: Secondary | ICD-10-CM

## 2012-08-22 DIAGNOSIS — Z5309 Procedure and treatment not carried out because of other contraindication: Secondary | ICD-10-CM | POA: Insufficient documentation

## 2012-08-22 DIAGNOSIS — K35209 Acute appendicitis with generalized peritonitis, without abscess, unspecified as to perforation: Secondary | ICD-10-CM | POA: Insufficient documentation

## 2012-08-22 DIAGNOSIS — I10 Essential (primary) hypertension: Secondary | ICD-10-CM

## 2012-08-22 DIAGNOSIS — I4891 Unspecified atrial fibrillation: Secondary | ICD-10-CM | POA: Insufficient documentation

## 2012-08-22 DIAGNOSIS — K3532 Acute appendicitis with perforation and localized peritonitis, without abscess: Secondary | ICD-10-CM | POA: Diagnosis present

## 2012-08-22 DIAGNOSIS — Z79899 Other long term (current) drug therapy: Secondary | ICD-10-CM | POA: Insufficient documentation

## 2012-08-22 DIAGNOSIS — Z01812 Encounter for preprocedural laboratory examination: Secondary | ICD-10-CM | POA: Insufficient documentation

## 2012-08-22 DIAGNOSIS — R609 Edema, unspecified: Secondary | ICD-10-CM | POA: Insufficient documentation

## 2012-08-22 DIAGNOSIS — K352 Acute appendicitis with generalized peritonitis, without abscess: Secondary | ICD-10-CM | POA: Insufficient documentation

## 2012-08-22 HISTORY — PX: COLONOSCOPY: SHX5424

## 2012-08-22 LAB — GLUCOSE, CAPILLARY
Glucose-Capillary: 127 mg/dL — ABNORMAL HIGH (ref 70–99)
Glucose-Capillary: 130 mg/dL — ABNORMAL HIGH (ref 70–99)
Glucose-Capillary: 145 mg/dL — ABNORMAL HIGH (ref 70–99)

## 2012-08-22 LAB — CBC
HCT: 42.6 % (ref 39.0–52.0)
Hemoglobin: 14.2 g/dL (ref 13.0–17.0)
MCH: 31 pg (ref 26.0–34.0)
MCHC: 33.3 g/dL (ref 30.0–36.0)
MCV: 93 fL (ref 78.0–100.0)
Platelets: 250 10*3/uL (ref 150–400)
RBC: 4.58 MIL/uL (ref 4.22–5.81)
RDW: 13.7 % (ref 11.5–15.5)
WBC: 6.4 10*3/uL (ref 4.0–10.5)

## 2012-08-22 LAB — BASIC METABOLIC PANEL
BUN: 13 mg/dL (ref 6–23)
CO2: 25 mEq/L (ref 19–32)
Calcium: 9.1 mg/dL (ref 8.4–10.5)
Chloride: 100 mEq/L (ref 96–112)
Creatinine, Ser: 0.87 mg/dL (ref 0.50–1.35)
GFR calc Af Amer: >90 mL/min (ref >90)
GFR calc non Af Amer: >90 mL/min (ref >90)
Glucose, Bld: 128 mg/dL — ABNORMAL HIGH (ref 70–99)
Potassium: 3.9 mEq/L (ref 3.5–5.1)
Sodium: 136 mEq/L (ref 135–145)

## 2012-08-22 LAB — MAGNESIUM: Magnesium: 2 mg/dL (ref 1.5–2.5)

## 2012-08-22 LAB — PROTIME-INR
INR: 1.06 (ref 0.00–1.49)
Prothrombin Time: 13.7 seconds (ref 11.6–15.2)

## 2012-08-22 LAB — TROPONIN I: Troponin I: <0.3 ng/mL (ref <0.30)

## 2012-08-22 SURGERY — COLONOSCOPY
Anesthesia: Monitor Anesthesia Care

## 2012-08-22 MED ORDER — INSULIN ASPART 100 UNIT/ML ~~LOC~~ SOLN
0.0000 [IU] | Freq: Three times a day (TID) | SUBCUTANEOUS | Status: DC
  Administered 2012-08-23: 5 [IU] via SUBCUTANEOUS
  Administered 2012-08-24: 2 [IU] via SUBCUTANEOUS

## 2012-08-22 MED ORDER — NAPROXEN SODIUM 275 MG PO TABS: 440.0000 mg | ORAL_TABLET | Freq: Every day | ORAL | Status: DC | PRN

## 2012-08-22 MED ORDER — SODIUM CHLORIDE 0.9 % IJ SOLN: 3.0000 mL | Freq: Two times a day (BID) | INTRAMUSCULAR | Status: DC

## 2012-08-22 MED ORDER — SODIUM CHLORIDE 0.9 % IJ SOLN: 3.0000 mL | INTRAMUSCULAR | Status: DC | PRN

## 2012-08-22 MED ORDER — SODIUM CHLORIDE 0.9 % IV SOLN: INTRAVENOUS | Status: DC

## 2012-08-22 MED ORDER — SODIUM CHLORIDE 0.9 % IV SOLN: 250.0000 mL | INTRAVENOUS | Status: DC | PRN

## 2012-08-22 MED ORDER — GLIPIZIDE 10 MG PO TABS
10.0000 mg | ORAL_TABLET | Freq: Two times a day (BID) | ORAL | Status: DC
  Administered 2012-08-23 – 2012-08-24 (×4): 10 mg via ORAL
  Filled 2012-08-22 (×5): qty 1

## 2012-08-22 MED ORDER — SODIUM CHLORIDE 0.9 % IV BOLUS (SEPSIS)
1000.0000 mL | Freq: Once | INTRAVENOUS | Status: AC
  Administered 2012-08-22: 1000 mL via INTRAVENOUS

## 2012-08-22 MED ORDER — CARVEDILOL 6.25 MG PO TABS
6.2500 mg | ORAL_TABLET | Freq: Two times a day (BID) | ORAL | Status: DC
  Administered 2012-08-22 – 2012-08-24 (×5): 6.25 mg via ORAL
  Filled 2012-08-22 (×6): qty 1

## 2012-08-22 MED ORDER — SODIUM CHLORIDE 0.9 % IJ SOLN
3.0000 mL | Freq: Two times a day (BID) | INTRAMUSCULAR | Status: DC
  Administered 2012-08-23 (×2): 3 mL via INTRAVENOUS

## 2012-08-22 MED ORDER — OXYCODONE-ACETAMINOPHEN 5-325 MG PO TABS
1.0000 | ORAL_TABLET | ORAL | Status: DC | PRN
  Administered 2012-08-23 – 2012-08-24 (×4): 2 via ORAL
  Filled 2012-08-22 (×4): qty 2

## 2012-08-22 MED ORDER — ENOXAPARIN SODIUM 150 MG/ML ~~LOC~~ SOLN
1.0000 mg/kg | Freq: Once | SUBCUTANEOUS | Status: AC
  Administered 2012-08-22: 190 mg via SUBCUTANEOUS
  Filled 2012-08-22: qty 2

## 2012-08-22 MED ORDER — ACETAMINOPHEN 325 MG PO TABS: 650.0000 mg | ORAL_TABLET | Freq: Four times a day (QID) | ORAL | Status: DC | PRN

## 2012-08-22 MED ORDER — DILTIAZEM HCL 25 MG/5ML IV SOLN
20.0000 mg | Freq: Once | INTRAVENOUS | Status: AC
  Administered 2012-08-22: 20 mg via INTRAVENOUS
  Filled 2012-08-22: qty 5

## 2012-08-22 NOTE — H&P (Signed)
  Date of Initial H&P: 08/17/12  History reviewed, patient examined, no change in status, stable for surgery.

## 2012-08-22 NOTE — OR Nursing (Signed)
Pt was in endoscopy admitting, appeared to be in afib.  Talked to Dr. Renold Don, gave order for 12 lead EKG.  Confirmed that pt was in afib with RVR, and Dr. Renold Don stated that procedure should probably be cancelled.  Spoke with Dr. Bosie Clos, stated procedure was to be cancelled and pt was to be taken to ED in stretcher.  Gave report to ED RN Shawna Orleans and brought pt to ED Triage room. Pauline Good, RN

## 2012-08-22 NOTE — Consult Note (Signed)
CONSULT NOTE  Date: 08/22/2012               Patient Name:  Maurice Nguyen. MRN: [id number]  DOB: September 30, 1965 Age / Sex: 47 y.o., male        PCP: Maurice Nguyen Primary Cardiologist: New to Maurice Nguyen            Referring Physician: Penny Pia, MD              Reason for Consult:  new onset atrial fibrillation            History of Present Illness: Patient is a 47 y.o. male with a PMHx of diabetes mellitus, morbid obesity, hypertension, and chronic back pain, who was admitted to [facility] on 08/22/2012 for evaluation of atrial fibrillation with a rapid ventricular response.  Maurice Nguyen is a 47 year old gentleman with several chronic medical conditions as noted above. He was admitted  back in December for abdominal pain. His son have her ruptured appendix with an associated phlegmon. He was treated conservatively at that time.   He has continued to have lots of diarrhea. He was seen today by Dr. Bosie Nguyen for possible endoscopy today. He was found to have rapid atrial fibrillation and was sent to [facility] long emergency room. In the ER he was found to have atrial fibrillation with a ventricular rate of 127. The patient had no symptoms to suggest that he was in atrial fibrillation. He denies any chest pain or shortness breath. Denies any palpitations.  The patient is on disability for his back. He does not get any exercise. He is able to walk without difficulty. He is able to climb 2 flights of stairs without significant chest pain or shortness of breath.   Medications: Outpatient medications: He's not had any of his outpatient medications for the past 2 days in preparation for the colonoscopy and endoscopy.  Included glipizide 10 mg 2 times a day Metformin 1000 mg twice a day "A blood pressure pill" - he does not remember the name.    (Not in a hospital admission)  Current medications: No current facility-administered medications for this encounter.   Current Outpatient  Prescriptions  Medication Sig Dispense Refill  . acetaminophen (TYLENOL) 325 MG tablet Take 650 mg by mouth every 6 (six) hours as needed. Pain      . amoxicillin-clavulanate (AUGMENTIN) 875-125 MG per tablet Take 1 tablet by mouth every 12 (twelve) hours.      Marland Kitchen glipiZIDE (GLUCOTROL) 10 MG tablet Take 10 mg by mouth 2 (two) times daily before a meal.       . metFORMIN (GLUCOPHAGE) 1000 MG tablet Take 1,000 mg by mouth 2 (two) times daily with a meal.      . naproxen sodium (ANAPROX) 220 MG tablet Take 440 mg by mouth daily as needed. For pain      . oxyCODONE-acetaminophen (PERCOCET/ROXICET) 5-325 MG per tablet Take 1-2 tablets by mouth every 4 (four) hours as needed. Pain         No Known Allergies   Past Medical History  Diagnosis Date  . Diabetes mellitus   . Morbid obesity   . Lumbar herniated disc   . 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 Obesity, morbid, BMI 50 or higher 07/13/2012  . Hypertension 07/13/2012  . Diabetes mellitus 07/13/2012  . Back pain, chronic 07/13/2012    Past Surgical History  Procedure Date  . Abscess drainage   . Dental surgery     History reviewed.  No pertinent family history.  Social History:  reports that he has never smoked. He has never used smokeless tobacco. He reports that he does not drink alcohol or use illicit drugs.   Review of Systems: Constitutional:  denies fever, chills, diaphoresis, appetite change and fatigue.  HEENT: denies photophobia, eye pain, redness, hearing loss, ear pain, congestion, sore throat, rhinorrhea, sneezing, neck pain, neck stiffness and tinnitus.  Respiratory: denies SOB, DOE, cough, chest tightness, and wheezing.  Cardiovascular: denies chest pain, palpitations and leg swelling.  Gastrointestinal: admits to nausea, vomiting, abdominal pain, diarrhea, constipation, blood in stool.  Genitourinary: denies dysuria, urgency, frequency, hematuria, flank pain and difficulty urinating.  Musculoskeletal: admits to    back pain,   Skin: denies pallor, rash and wound.  Neurological: denies dizziness, seizures, syncope, weakness, light-headedness, numbness and headaches.   Hematological: denies adenopathy, easy bruising, personal or family bleeding history.  Psychiatric/ Behavioral: denies suicidal ideation, mood changes, confusion, nervousness, sleep disturbance and agitation.    Physical Exam: BP 145/87  Pulse 106  Temp 98.7 F (37.1 C) (Oral)  Resp 22  Ht 6\' 1"  (1.854 m)  Wt 419 lb 1.5 oz (190.1 kg)  BMI 55.29 kg/m2  SpO2 100%  General: Vital signs reviewed and noted. Morbidly obese gentleman in NAD  Head: Normocephalic, atraumatic, sclera anicteric, mucus membranes are moist  Neck: Supple. Negative for carotid bruits. JVD not elevated.  Lungs:  Clear bilaterally to auscultation without wheezes, rales, or rhonchi. Breathing is unlabored.  Heart: Irreg. irreg.  Abdomen:  Soft, non-tender,  Morbidly obese  MSK: Strength and the appear normal for age.  Extremities: Trace - 1+ edema , chronic stasis changes.  Neurologic: Alert and oriented X 3. Moves all extremities spontaneously.  Psych: Responds to questions appropriately with a normal affect.    Lab results: Basic Metabolic Panel:  Lab 08/22/12 4098  NA 136  K 3.9  CL 100  CO2 25  GLUCOSE 128*  BUN 13  CREATININE 0.87  CALCIUM 9.1  MG 2.0  PHOS --    Liver Function Tests: No results found for this basename: AST:3,ALT:3,ALKPHOS:3,BILITOT:3,PROT:3,ALBUMIN:3 in the last 168 hours No results found for this basename: LIPASE:3,AMYLASE:3 in the last 168 hours No results found for this basename: AMMONIA:3 in the last 168 hours  CBC:  Lab 08/22/12 1223  WBC 6.4  NEUTROABS --  HGB 14.2  HCT 42.6  MCV 93.0  PLT 250    Cardiac Enzymes:  Lab 08/22/12 1223  CKTOTAL --  CKMB --  CKMBINDEX --  TROPONINI <0.30    BNP: No components found with this basename: POCBNP:3  CBG:  Lab 08/22/12 1004  GLUCAP 127*    Coagulation  Studies:  Basename 08/22/12 1223  LABPROT 13.7  INR 1.06      ECG: Peak ECG reveals atrial fibrillation with a rapid ventricular response. He has nonspecific ST and T wave changes.  He has poor R-wave progression that is likely due to lead placement.   Imaging: Dg Chest 2 View  08/22/2012  *RADIOLOGY REPORT*  Clinical Data: New onset of atrial fibrillation  CHEST - 2 VIEW  Comparison: 12/13/ 13  Findings: There is cardiomegaly new from prior exam.  No acute infiltrate or pleural effusion.  No pulmonary edema.  Study is limited by patient's large body habitus.  IMPRESSION: Cardiomegaly.  No active disease.   Original Report Authenticated By: Natasha Mead, M.D.       Last Echo: 04/02/2009-normal left ventricular systolic function with an ejection fraction  of 65-70%. He has mild left ventricular hypertrophy. He had no significant valvular abnormalities. The study was technically difficult.    Assessment & Plan:  1.  New onset atrial fibrillation (08/22/2012)  Mr. Turrell presents today with newly diagnosed atrial fibrillation. It sounds like he has not been to the doctor in quite some time. We have no old EKGs on the chart. He presented today for an endoscopy and was found to have atrial fibrillation.    a) CHADS scoring/ Anticoagulation recommendations Has a history of diabetes mellitus and hypertension. We will be getting an echocardiogram to evaluate for the possibility of congestive heart failure. This gives him a CHADS score of at least 2. I would recommend long-term anticoagulation with Coumadin.  In the short-term we could treat him with heparin. I'm not entirely clear of the plans of whether or not he is going to go to surgery. If his echocardiogram reveals normal left ventricular systolic function and he can certainly go ahead with surgery without significant risk. We will give more formal recommendations following the echo.  If there is some indication that he will go for surgery in  the next week or so then I do not think that starting Coumadin is necessary.   Alternatively, if the plans are to continue with medical therapy for his ruptured appendix, I would recommend initiating Coumadin with a goal INR of between 2.0 and 3.0.  I do not think that he needs to remain in the hospital to get his INR therapeutic. He will need close followup in a Coumadin clinic either at his Medical doctor's office or in the cardiology office.  He is currently seen by Dr. Bruna Potter.  He may be able to have his INR managed in his office. Alternatively, he may come to the [place]  Coumadin clinic for Coumadin management. [phone])    B) preoperative risk assessment:  He certainly does not have any signs or symptoms of congestive heart failure, no signs or symptoms of angina. The fact that he has atrial fibrillation does not necessarily increase his risk during surgery.  If the echocardiogram shows normal left ventricular systolic function as it did in 2010 then I do not think that he is at significant risk for his upcoming abdominal surgery.   C) rate control: We'll use beta blockers for rate control. We can add diltiazem if needed.  At this point I would not treat him with an antiarrhythmic. We'll see how he does with rate control and anticoagulation.  2.  Hypertension (07/13/2012)  he carries the diagnosis of hypertension. He states that he was started on blood pressure medications 2 weeks ago. His echocardiogram in 2000 and revealed left ventricular hypertrophy and he is morbidly obese. I suspected he's had hypertension for much longer than just 2 weeks. We will start him on a beta blocker. I've chosen carvedilol for the time being although we also could use metoprolol.  As noted above we can also use diltiazem if needed assuming he has normal left ventricular systolic function.   Diabetes mellitus (07/13/2012)     Vesta Mixer, [name], MD, [facility] 08/22/2012, 4:08 PM

## 2012-08-22 NOTE — Interval H&P Note (Signed)
History and Physical Interval Note:  08/22/2012 10:12 AM  Maurice Nguyen.  has presented today for surgery, with the diagnosis of diarrhea  The various methods of treatment have been discussed with the patient and family. After consideration of risks, benefits and other options for treatment, the patient has consented to  Procedure(s) (LRB) with comments: COLONOSCOPY (N/A) as a surgical intervention .  The patient's history has been reviewed, patient examined, no change in status, stable for surgery.  I have reviewed the patient's chart and labs.  Questions were answered to the patient's satisfaction.     Allyiah Gartner C.

## 2012-08-22 NOTE — ED Provider Notes (Signed)
History     CSN: [id number]  Arrival date & time 08/22/12  1047   First MD Initiated Contact with Patient 08/22/12 1143      Chief Complaint  Patient presents with  . Irregular Heart Beat    (Consider location/radiation/quality/duration/timing/severity/associated sxs/prior treatment) HPI Comments: Mr. Klingel was supposed to have a routine outpt colonoscopy today but was note to have a fast, irregular rhythm during the preprocedure triage process.  He denies any history of dysrhythmia and states he feels fine.  He denies recent illnesses, CP, SOB, palpitations, and syncope.  He has had issues with chronic, recurrent, intermittent diarrhea.  He denies dehydration.  The history is provided by the patient. No language interpreter was used.    Past Medical History  Diagnosis Date  . Diabetes mellitus   . Morbid obesity   . Lumbar herniated disc   . 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 Obesity, morbid, BMI 50 or higher 07/13/2012  . Hypertension 07/13/2012  . Diabetes mellitus 07/13/2012  . Back pain, chronic 07/13/2012    Past Surgical History  Procedure Date  . Abscess drainage   . Dental surgery     No family history on file.  History  Substance Use Topics  . Smoking status: Never Smoker   . Smokeless tobacco: Never Used  . Alcohol Use: No     Comment: rarely      Review of Systems  All other systems reviewed and are negative.    Allergies  Review of patient's allergies indicates no known allergies.  Home Medications   Current Outpatient Rx  Name  Route  Sig  Dispense  Refill  . ACETAMINOPHEN 325 MG PO TABS   Oral   Take 650 mg by mouth every 6 (six) hours as needed. Pain         . AMOXICILLIN-POT CLAVULANATE 875-125 MG PO TABS   Oral   Take 1 tablet by mouth every 12 (twelve) hours.         Marland Kitchen GLIPIZIDE 10 MG PO TABS   Oral   Take 10 mg by mouth 2 (two) times daily before a meal.          . METFORMIN HCL 1000 MG PO TABS   Oral   Take 1,000  mg by mouth 2 (two) times daily with a meal.         . NAPROXEN SODIUM 220 MG PO TABS   Oral   Take 440 mg by mouth daily as needed. For pain         . OXYCODONE-ACETAMINOPHEN 5-325 MG PO TABS   Oral   Take 1-2 tablets by mouth every 4 (four) hours as needed. Pain           BP 124/66  Pulse 127  Temp 98.7 F (37.1 C) (Oral)  Resp 20  SpO2 100%  Physical Exam  Nursing note and vitals reviewed. Constitutional: He is oriented to person, place, and time. He appears well-developed and well-nourished. No distress.  HENT:  Head: Normocephalic and atraumatic.  Right Ear: External ear normal.  Left Ear: External ear normal.  Mouth/Throat: Oropharynx is clear and moist. No oropharyngeal exudate.  Eyes: Conjunctivae normal are normal. Pupils are equal, round, and reactive to light. Right eye exhibits no discharge. No scleral icterus.  Neck: Normal range of motion. Neck supple. No JVD present. No tracheal deviation present. No thyromegaly present.  Cardiovascular: Normal heart sounds, intact distal pulses and normal pulses.  An irregularly irregular rhythm present.  No extrasystoles are present. Tachycardia present.  PMI is not displaced.  Exam reveals no gallop, no distant heart sounds, no friction rub and no decreased pulses.   No murmur heard. Pulmonary/Chest: Effort normal and breath sounds normal. No respiratory distress. He has no wheezes. He has no rales. He exhibits no tenderness.  Abdominal: Soft. Bowel sounds are normal. He exhibits no mass. There is no tenderness. There is no rebound and no guarding.       Obese, protuberant   Musculoskeletal: Normal range of motion. He exhibits edema. He exhibits no tenderness.  Neurological: He is alert and oriented to person, place, and time. No cranial nerve deficit.  Skin: Skin is warm. No rash noted. He is not diaphoretic. No erythema.  Psychiatric: He has a normal mood and affect. His behavior is normal.    ED Course  Procedures  (including critical care time)   Labs Reviewed  CBC  BASIC METABOLIC PANEL  PROTIME-INR  TROPONIN I  MAGNESIUM   No results found.   No diagnosis found.   Date: 08/22/2012 @ 1019  Rate: 111 bpm  Rhythm: atrial fibrillation  QRS Axis: left  Intervals:    ST/T Wave abnormalities: normal  Conduction Disutrbances:none  Narrative Interpretation: afib + RVR  Old EKG Reviewed: prev study was sinus with inf and ant q waves Repeat study at 1101 is unchanged other than rate      MDM  Pt presents for evaluation of an irregular heartbeat - atrial fibrillation.  He appears nontoxic, denies pain, and has otherwise stable VS, NAD.  He denies palpitations and has no prior hx of atrial fibrillation.  Will obtain basic labs, CXR, bolus IVF, and administer cardizem for rate control.  Will reassess.  1455.  Cont afib.  Rate is improved.  Remainder of exam is unremarkable except new onset cardiomegaly (when compared to .  Discussed with the on-call hospitalist provider.  He will be admitted for further mgmnt.      Tobin Chad, MD 08/22/12 [phone]

## 2012-08-22 NOTE — ED Notes (Signed)
Cardiologist at bedside.  

## 2012-08-22 NOTE — H&P (Signed)
  Patient found to be in atrial fibrillation with rapid ventricular response in preop evaluation by anesthesia and procedure was cancelled. Patient sent to ER for further evaluation. He denies any previous history of Afib. If patient is admitted could potentially do the colonoscopy as an inpt if his cardiac status is stable but right now the procedure is cancelled due to this change in his cardiac status.

## 2012-08-22 NOTE — ED Notes (Signed)
Returned from XR 

## 2012-08-22 NOTE — Anesthesia Preprocedure Evaluation (Deleted)
Anesthesia Evaluation  Patient identified by MRN, date of birth, ID band Patient awake    Reviewed: Allergy & Precautions, H&P , NPO status , Patient's Chart, lab work & pertinent test results  Airway Mallampati: II TM Distance: >3 FB Neck ROM: Full    Dental  (+) Teeth Intact and Dental Advisory Given   Pulmonary neg pulmonary ROS,  breath sounds clear to auscultation  Pulmonary exam normal       Cardiovascular hypertension, Pt. on medications + dysrhythmias Atrial Fibrillation Rhythm:Irregular Rate:Tachycardia     Neuro/Psych negative neurological ROS  negative psych ROS   GI/Hepatic negative GI ROS, Neg liver ROS,   Endo/Other  diabetes, Type obesity  Renal/GU negative Renal ROS  negative genitourinary   Musculoskeletal negative musculoskeletal ROS (+)   Abdominal   Peds negative pediatric ROS (+)  Hematology negative hematology ROS (+)   Anesthesia Other Findings   Reproductive/Obstetrics negative OB ROS                         Anesthesia Physical Anesthesia Plan  ASA: III  Anesthesia Plan: MAC   Post-op Pain Management:    Induction: Intravenous  Airway Management Planned: Simple Face Mask  Additional Equipment:   Intra-op Plan:   Post-operative Plan:   Informed Consent: I have reviewed the patients History and Physical, chart, labs and discussed the procedure including the risks, benefits and alternatives for the proposed anesthesia with the patient or authorized representative who has indicated his/her understanding and acceptance.   Dental advisory given  Plan Discussed with: CRNA  Anesthesia Plan Comments: (Pt in a-fib with RVR. Case cancelled for workup and treatment of a-fib.)      Anesthesia Quick Evaluation

## 2012-08-22 NOTE — ED Notes (Signed)
Pt is sent over from endo he was to have an colonoscopy done for loose bm and when they put him on monitor he was in a fib, pt denies any c/o no chest pain/ has an 20 g rt hand,

## 2012-08-22 NOTE — ED Notes (Signed)
[id number]<AV> Expected date:<BR> Expected time:<BR> Means of arrival:<BR> Comments:<BR> [place]

## 2012-08-22 NOTE — ED Notes (Signed)
Patient transported to X-ray 

## 2012-08-22 NOTE — H&P (Signed)
[facility] History and Physical  Maurice Nguyen. [id number]:[id number] DOB: 16-Mar-1966 DOA: 08/22/2012  Referring physician: Dr Lorenso Courier PCP: Burtis Junes, MD  Specialists: none  Chief Complaint: atrial fibrillation on monitor  HPI: Maurice Nguyen. is a 47 y.o. male  With history of chronic diarrhea who recently was about to get colonoscopy for further investigation by Dr. Bosie Clos.  During preop patient was found to have atrial fibrillation and subsequent recommendations were made for patient to go to the ER for further evaluation.  Patient denies any chest pain, palpitations, SOB, dizziness, nausea, or emesis.  Really patient is asymptomatic and has no complaints other than his feet aching.    While in the ED patient was found to have Afib with RVR with documented heart rate of 127.   Patient denies any prior history of atrial fibrillation and as a result I discussed case with [facility] cardiology for further evaluation and recommendations. In ED patient received Cardizem 20 mg IV and heart rate has been < 110 after cardizem administration.    Review of Systems: Reviewed and negative unless otherwise mentioned above.  Past Medical History  Diagnosis Date  . Diabetes mellitus   . Morbid obesity   . Lumbar herniated disc   . 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 Obesity, morbid, BMI 50 or higher 07/13/2012  . Hypertension 07/13/2012  . Diabetes mellitus 07/13/2012  . Back pain, chronic 07/13/2012   Past Surgical History  Procedure Date  . Abscess drainage   . Dental surgery    Social History:  reports that he has never smoked. He has never used smokeless tobacco. He reports that he does not drink alcohol or use illicit drugs. Patient lives at home  Can patient participate in ADLs? yes  No Known Allergies  No family history on file. None reported when asked directly  Prior to Admission medications   Medication Sig Start Date End Date Taking? Authorizing Provider    acetaminophen (TYLENOL) 325 MG tablet Take 650 mg by mouth every 6 (six) hours as needed. Pain 07/13/12   Sherrie George, PA  amoxicillin-clavulanate (AUGMENTIN) 875-125 MG per tablet Take 1 tablet by mouth every 12 (twelve) hours. 08/03/12   Mariella Saa, MD  glipiZIDE (GLUCOTROL) 10 MG tablet Take 10 mg by mouth 2 (two) times daily before a meal.     Historical Provider, MD  metFORMIN (GLUCOPHAGE) 1000 MG tablet Take 1,000 mg by mouth 2 (two) times daily with a meal. 07/13/12   Sherrie George, PA  naproxen sodium (ANAPROX) 220 MG tablet Take 440 mg by mouth daily as needed. For pain    Historical Provider, MD  oxyCODONE-acetaminophen (PERCOCET/ROXICET) 5-325 MG per tablet Take 1-2 tablets by mouth every 4 (four) hours as needed. Pain 08/03/12   Mariella Saa, MD   Physical Exam: Filed Vitals:   08/22/12 1116 08/22/12 1308 08/22/12 1342 08/22/12 1427  BP:  158/90 139/74 137/75  Pulse: 127 97 97 103  Temp:      TempSrc:      Resp:  21    Height:   6\' 1"  (1.854 m)   Weight:   190.1 kg (419 lb 1.5 oz)   SpO2:  100% 100% 100%     General:  Pt in NAD, Alert and Awake  Eyes: EOMI, nonicteric  ENT: normal exterior appearance  Neck: supple, no goiter  Cardiovascular: irregular regular, no murmurs rubs or gallops  Respiratory: CTA BL, no wheezes  Abdomen: soft, NT, ND  Skin:  warm and dry  Musculoskeletal: no cyanosis or clubbing  Psychiatric: Alert and Oreinted x 3, mood and affect appropriate  Neurologic: answers question appropriately, moves all extremities  Labs on Admission:  Basic Metabolic Panel:  Lab 08/22/12 1610  NA 136  K 3.9  CL 100  CO2 25  GLUCOSE 128*  BUN 13  CREATININE 0.87  CALCIUM 9.1  MG 2.0  PHOS --   Liver Function Tests: No results found for this basename: AST:5,ALT:5,ALKPHOS:5,BILITOT:5,PROT:5,ALBUMIN:5 in the last 168 hours No results found for this basename: LIPASE:5,AMYLASE:5 in the last 168 hours No results found for this  basename: AMMONIA:5 in the last 168 hours CBC:  Lab 08/22/12 1223  WBC 6.4  NEUTROABS --  HGB 14.2  HCT 42.6  MCV 93.0  PLT 250   Cardiac Enzymes:  Lab 08/22/12 1223  CKTOTAL --  CKMB --  CKMBINDEX --  TROPONINI <0.30    BNP (last 3 results) No results found for this basename: PROBNP:3 in the last 8760 hours CBG:  Lab 08/22/12 1004  GLUCAP 127*    Radiological Exams on Admission: Dg Chest 2 View  08/22/2012  *RADIOLOGY REPORT*  Clinical Data: New onset of atrial fibrillation  CHEST - 2 VIEW  Comparison: 12/13/ 13  Findings: There is cardiomegaly new from prior exam.  No acute infiltrate or pleural effusion.  No pulmonary edema.  Study is limited by patient's large body habitus.  IMPRESSION: Cardiomegaly.  No active disease.   Original Report Authenticated By: Natasha Mead, M.D.     EKG: Pending  Assessment/Plan Active Problems:  Hypertension  Diabetes mellitus  D (diarrhea)  New onset atrial fibrillation Chronic Diarrhea   1. New onset atrial fibrillation - Consulted Cardiology at this point will defer further management to them as per my discussion with them - Will f/u with their recommendations - covered with lovenox currently for anticoagulation.  Will defer to cardiology who expresses that they will be by to evaluate patient in ED to further make recommendations.  2. DM - At this point while patient is in house will hold metformin - obtain hgba1c - SSI with CBG's per protocol  3. Chronic Diarrhea - Per discussion with patient he would not like to prolong his hospital stay and as such would prefer to reschedule his colonoscopy as outpatient.  Will advance diet to diabetic diet  4. HTN - Patient will require rate controlling medication for his A fib. - Will monitor blood pressures while patient is in house.   Code Status: full Family Communication: Discussed with patient and family member at bedside Disposition Plan: Pending further work up and treatment  recommendations from cardiology  Time spent: > 60 minutes  Penny Pia [facility] Pager [phone]  If 7PM-7AM, please contact night-coverage [email or web address] Password [facility] 08/22/2012, 3:14 PM

## 2012-08-23 ENCOUNTER — Encounter (HOSPITAL_COMMUNITY): Payer: Self-pay | Admitting: Gastroenterology

## 2012-08-23 DIAGNOSIS — I4891 Unspecified atrial fibrillation: Secondary | ICD-10-CM

## 2012-08-23 LAB — CBC
HCT: 40.1 % (ref 39.0–52.0)
Hemoglobin: 13.2 g/dL (ref 13.0–17.0)
MCH: 30.6 pg (ref 26.0–34.0)
MCHC: 32.9 g/dL (ref 30.0–36.0)
MCV: 92.8 fL (ref 78.0–100.0)
Platelets: 233 10*3/uL (ref 150–400)
RBC: 4.32 MIL/uL (ref 4.22–5.81)
RDW: 13.8 % (ref 11.5–15.5)
WBC: 5.3 10*3/uL (ref 4.0–10.5)

## 2012-08-23 LAB — BASIC METABOLIC PANEL
BUN: 16 mg/dL (ref 6–23)
CO2: 28 mEq/L (ref 19–32)
Calcium: 8.6 mg/dL (ref 8.4–10.5)
Chloride: 104 mEq/L (ref 96–112)
Creatinine, Ser: 0.97 mg/dL (ref 0.50–1.35)
GFR calc Af Amer: >90 mL/min (ref >90)
GFR calc non Af Amer: >90 mL/min (ref >90)
Glucose, Bld: 163 mg/dL — ABNORMAL HIGH (ref 70–99)
Potassium: 3.5 mEq/L (ref 3.5–5.1)
Sodium: 139 mEq/L (ref 135–145)

## 2012-08-23 LAB — GLUCOSE, CAPILLARY
Glucose-Capillary: 207 mg/dL — ABNORMAL HIGH (ref 70–99)
Glucose-Capillary: 86 mg/dL (ref 70–99)
Glucose-Capillary: 111 mg/dL — ABNORMAL HIGH (ref 70–99)

## 2012-08-23 LAB — HEMOGLOBIN A1C
Hgb A1c MFr Bld: 6.8 % — ABNORMAL HIGH (ref <5.7)
Mean Plasma Glucose: 148 mg/dL — ABNORMAL HIGH (ref <117)

## 2012-08-23 MED ORDER — DILTIAZEM HCL ER COATED BEADS 240 MG PO CP24
240.0000 mg | ORAL_CAPSULE | Freq: Every day | ORAL | Status: DC
  Administered 2012-08-23 – 2012-08-24 (×2): 240 mg via ORAL
  Filled 2012-08-23 (×2): qty 1

## 2012-08-23 MED ORDER — ASPIRIN 325 MG PO TABS
325.0000 mg | ORAL_TABLET | Freq: Every day | ORAL | Status: DC
  Administered 2012-08-23 – 2012-08-24 (×2): 325 mg via ORAL
  Filled 2012-08-23 (×2): qty 1

## 2012-08-23 NOTE — Progress Notes (Signed)
[facility] PROGRESS NOTE  Maurice Nguyen. ZOX:[id number] DOB: Dec 23, 1965 DOA: 08/22/2012 PCP: Burtis Junes, MD  Assessment/Plan:  afib with RVR New onset. Patient still with HR going up to 140s on tele. Likely triggered by underlying HTN. HR poorly controlled with current dose of cardizem and coreg. cardizem changed to long acting ( 240 mg daily) by cardiology today.  2D echo with EF of 55-60%. CHADS2 score of 2 . Patient awaiting follow up visit with his surgeon on 1/31 regarding his recent ruptured appendix.  -currently on ASA . Will be placed on coumadin once surgical issue is resolved. Will call surgery consult tomorrow if patient remains in the hospital.   Chronic diarrhea  was scheduled for colonoscopy on 1/27 when he developed afib preop. Spoke with Dr Bosie Clos . Will follow as outpt   Diabetes mellitus  Holding metformin. A1C of 6.8 Continue SSI  Hypertension BP stable      Code Status: full Family Communication:  at bedside Disposition Plan:    Consultants:  [facility]  Procedures:  2D echo  Antibiotics:  none  HPI/Subjective: Denies any symptoms.   Objective: Filed Vitals:   08/22/12 2158 08/23/12 0556 08/23/12 0741 08/23/12 1331  BP: 151/92 123/71 104/63 134/68  Pulse:  98 110 99  Temp:  97.5 F (36.4 C)  98.4 F (36.9 C)  TempSrc:  Oral  Oral  Resp:  18  18  Height:      Weight:      SpO2:  98%  100%    Intake/Output Summary (Last 24 hours) at 08/23/12 1459 Last data filed at 08/23/12 1300  Gross per 24 hour  Intake    960 ml  Output      0 ml  Net    960 ml   Filed Weights   08/22/12 1342  Weight: 190.1 kg (419 lb 1.5 oz)    Exam:   General:  Middle aged morbidly obese male in NAD  HEENT: no pallor, moist oral mucosa  Cardiovascular: S1& S2 Irregular, no murmurs  Respiratory: equal breath sounds b/l, breath sounds poorly appreciated due to body habitus  Abdomen:  Soft, NT, ND, BS+  Ext:  warm, trace edema b/l   CNS: AAOX3  Data Reviewed: Basic Metabolic Panel:  Lab 08/23/12 8119 08/22/12 1223  NA 139 136  K 3.5 3.9  CL 104 100  CO2 28 25  GLUCOSE 163* 128*  BUN 16 13  CREATININE 0.97 0.87  CALCIUM 8.6 9.1  MG -- 2.0  PHOS -- --   Liver Function Tests: No results found for this basename: AST:5,ALT:5,ALKPHOS:5,BILITOT:5,PROT:5,ALBUMIN:5 in the last 168 hours No results found for this basename: LIPASE:5,AMYLASE:5 in the last 168 hours No results found for this basename: AMMONIA:5 in the last 168 hours CBC:  Lab 08/23/12 0430 08/22/12 1223  WBC 5.3 6.4  NEUTROABS -- --  HGB 13.2 14.2  HCT 40.1 42.6  MCV 92.8 93.0  PLT 233 250   Cardiac Enzymes:  Lab 08/22/12 1223  CKTOTAL --  CKMB --  CKMBINDEX --  TROPONINI <0.30   BNP (last 3 results) No results found for this basename: PROBNP:3 in the last 8760 hours CBG:  Lab 08/23/12 1212 08/23/12 0734 08/22/12 2047 08/22/12 1732 08/22/12 1004  GLUCAP 86 207* 145* 130* 127*    No results found for this or any previous visit (from the past 240 hour(s)).   Studies: Dg Chest 2 View  08/22/2012  *RADIOLOGY REPORT*  Clinical Data: New onset  of atrial fibrillation  CHEST - 2 VIEW  Comparison: 12/13/ 13  Findings: There is cardiomegaly new from prior exam.  No acute infiltrate or pleural effusion.  No pulmonary edema.  Study is limited by patient's large body habitus.  IMPRESSION: Cardiomegaly.  No active disease.   Original Report Authenticated By: Natasha Mead, M.D.     Scheduled Meds:   . aspirin  325 mg Oral Daily  . carvedilol  6.25 mg Oral BID WC  . diltiazem  240 mg Oral Daily  . glipiZIDE  10 mg Oral BID AC  . insulin aspart  0-15 Units Subcutaneous TID WC  . sodium chloride  3 mL Intravenous Q12H  . sodium chloride  3 mL Intravenous Q12H   Continuous Infusions:    Time spent:25 minutes    Maurice Nguyen  [facility] Pager [phone]. If 8PM-8AM, please contact night-coverage at  [email or web address], password [email or web address] 08/23/2012, 2:59 PM  LOS: 1 day

## 2012-08-23 NOTE — Progress Notes (Signed)
*  PRELIMINARY RESULTS* Echocardiogram 2D Echocardiogram has been performed.  Maurice Nguyen 08/23/2012, 12:18 PM

## 2012-08-23 NOTE — Progress Notes (Signed)
    Subjective:  No CP, dyspnea, palps  Objective:  Vital Signs in the last 24 hours: Temp:  [97.5 F (36.4 C)-98.7 F (37.1 C)] 97.5 F (36.4 C) (01/29 0556) Pulse Rate:  [57-127] 110  (01/29 0741) Resp:  [18-22] 18  (01/29 0556) BP: (103-158)/(11-122) 104/63 mmHg (01/29 0741) SpO2:  [98 %-100 %] 98 % (01/29 0556) Weight:  [190.057 kg (419 lb)-190.1 kg (419 lb 1.5 oz)] 190.1 kg (419 lb 1.5 oz) (01/28 1342)  Intake/Output from previous day: 01/28 0701 - 01/29 0700 In: 360 [P.O.:360] Out: -   Physical Exam: Pt is alert and oriented, morbidly obese male in NAD HEENT: normal Neck: JVP - normal Lungs: CTA bilaterally CV: tachy and irregular without murmur or gallop Abd: soft, NT, obese Ext: 1+ pretibial edema bilaterally Skin: warm/dry no rash  Lab Results:  Basename 08/23/12 0430 08/22/12 1223  WBC 5.3 6.4  HGB 13.2 14.2  PLT 233 250    Basename 08/23/12 0430 08/22/12 1223  NA 139 136  K 3.5 3.9  CL 104 100  CO2 28 25  GLUCOSE 163* 128*  BUN 16 13  CREATININE 0.97 0.87    Basename 08/22/12 1223  TROPONINI <0.30    Cardiac Studies: 2D Echo pending  Tele: Atrial fib heart rate 130 bpm  Assessment/Plan:  1. AFIB with RVR 2. HTN 3. Morbid obesity 4. Ruptured appendix  Needs better HR control. Will add diltiazem 240 mg daily today. Would use ASA 325 mg for now until plans for surgery are delineated. If he does not go through with surgery, agree he should be anticoagulated with warfarin. He is scheduled to see his surgeon in a few days. Await 2D echo today. If heart rate better controlled would anticipate discharge in 24 hours.  Tonny Bollman, M.D. 08/23/2012, 8:47 AM

## 2012-08-24 DIAGNOSIS — I4891 Unspecified atrial fibrillation: Secondary | ICD-10-CM

## 2012-08-24 LAB — GLUCOSE, CAPILLARY
Glucose-Capillary: 148 mg/dL — ABNORMAL HIGH (ref 70–99)
Glucose-Capillary: 118 mg/dL — ABNORMAL HIGH (ref 70–99)
Glucose-Capillary: 130 mg/dL — ABNORMAL HIGH (ref 70–99)
Glucose-Capillary: 125 mg/dL — ABNORMAL HIGH (ref 70–99)

## 2012-08-24 MED ORDER — CARVEDILOL 6.25 MG PO TABS: 6.2500 mg | ORAL_TABLET | Freq: Two times a day (BID) | ORAL | Status: DC

## 2012-08-24 MED ORDER — ASPIRIN 325 MG PO TABS: 325.0000 mg | ORAL_TABLET | Freq: Every day | ORAL | Status: DC

## 2012-08-24 MED ORDER — DILTIAZEM HCL ER COATED BEADS 240 MG PO CP24: 240.0000 mg | ORAL_CAPSULE | Freq: Every day | ORAL | Status: DC

## 2012-08-24 MED ORDER — FUROSEMIDE 40 MG PO TABS: 40.0000 mg | ORAL_TABLET | Freq: Every day | ORAL | Status: DC

## 2012-08-24 MED ORDER — FUROSEMIDE 40 MG PO TABS
40.0000 mg | ORAL_TABLET | Freq: Every day | ORAL | Status: DC
  Administered 2012-08-24: 40 mg via ORAL
  Filled 2012-08-24: qty 1

## 2012-08-24 NOTE — Progress Notes (Signed)
    Subjective:  No CP or palps.  Objective:  Vital Signs in the last 24 hours: Temp:  [97.9 F (36.6 C)-98.4 F (36.9 C)] 97.9 F (36.6 C) (01/30 0528) Pulse Rate:  [90-120] 90  (01/30 0528) Resp:  [18-20] 18  (01/30 0528) BP: (104-134)/(63-81) 125/74 mmHg (01/30 0528) SpO2:  [98 %-100 %] 98 % (01/30 0528)  Intake/Output from previous day: 01/29 0701 - 01/30 0700 In: 840 [P.O.:840] Out: -   Physical Exam: Pt is alert and oriented, obese male in NAD HEENT: normal Neck: JVP - normal Lungs: CTA bilaterally CV: irreg irreg without murmur or gallop Abd: soft, NT, obese Ext: 1+ bilateral ankle and pretibial edema, distal pulses intact and equal Skin: warm/dry no rash  Lab Results:  Basename 08/23/12 0430 08/22/12 1223  WBC 5.3 6.4  HGB 13.2 14.2  PLT 233 250    Basename 08/23/12 0430 08/22/12 1223  NA 139 136  K 3.5 3.9  CL 104 100  CO2 28 25  GLUCOSE 163* 128*  BUN 16 13  CREATININE 0.97 0.87    Basename 08/22/12 1223  TROPONINI <0.30    Cardiac Studies: 2D ECHO: Left ventricle: The cavity size was at the upper limits of normal. Systolic function was normal. The estimated ejection fraction was in the range of 55% to 60%. Regional wall motion abnormalities cannot be excluded. The study was not technically sufficient to allow evaluation of LV diastolic dysfunction due to atrial fibrillation.  ------------------------------------------------------------ Aortic valve: Structurally normal valve. Trileaflet. Cusp separation was normal. Doppler: Transvalvular velocity was within the normal range. There was no stenosis. No regurgitation.  ------------------------------------------------------------ Aorta: Aortic root: The aortic root was normal in size. Ascending aorta: The ascending aorta was normal in size.  ------------------------------------------------------------ Mitral valve: Mildly thickened leaflets . Doppler: No significant regurgitation. Peak  gradient: 3mm Hg (D).  ------------------------------------------------------------ Left atrium: The atrium was mildly to moderately dilated.  ------------------------------------------------------------ Right ventricle: The cavity size was normal. Wall thickness was normal. Systolic function was normal.  ------------------------------------------------------------ Tricuspid valve: Poorly visualized.  ------------------------------------------------------------ Right atrium: The atrium was mildly dilated.  ------------------------------------------------------------ Pericardium: There was no pericardial effusion.  ------------------------------------------------------------ Systemic veins: Inferior vena cava: The vessel was dilated; the respirophasic diameter changes were blunted (< 50%); findings are consistent with elevated central venous pressure.  Tele: Atrial fib, heart rate 80-90s, personally reviewed  Assessment/Plan:  1. Atrial fibrillation - heart rate now controlled on combination of diltiazem and carvedilol. 2. Morbid obesity 3. HTN 4. Edema  Echo reviewed and LV function remains normal. I think the patient is stable for discharge today. Would d/c on current doses of dilt/coreg. Continue ASA 325 mg daily. Add furosemide 40 mg daily x 5 days then as needed for edema. Will arrange follow-up in [facility] office with Dr Elease Hashimoto or his PA/NP within 2 weeks. Plan for ASA until decision made about abdominal surgery (outpatient appt pending tomorrow). Will need long-term anticoagulation after surgery for prevention of thromboembolism in setting of AF with CHADS=2 (HTN/DM).  Tonny Bollman, M.D. 08/24/2012, 7:21 AM

## 2012-08-24 NOTE — Progress Notes (Signed)
Pt complaining of feeling sweaty and dizzy.  Vitals signs within normal range for patient, monitor shows a fib with HR 80's.  No diaphoresis noted on hands, a little moist on patient's neck.  Pt has been sitting, sleeping in recliner most of the morning with wife sleeping in room as well.  Blood sugar was 130.  Pt asked for ice cream and went back to sleep.  Stated they would leave around 5.  Dr. Gonzella Lex notified of this, will continue to monitor.

## 2012-08-24 NOTE — Discharge Summary (Addendum)
Physician Discharge Summary  Randall Hiss. ZOX:[id number] DOB: Aug 25, 1965 DOA: 08/22/2012  PCP: Burtis Junes, MD  Admit date: 08/22/2012 Discharge date: 08/24/2012  Time spent: 40 minutes  Recommendations for Outpatient Follow-up:  Will need outpatient PCP and cardiology followup Has appointment with the surgeon tomorrow regarding recent appendix perforation.  Discharge Diagnoses:  Principal Problem:  *New onset atrial fibrillation  Active Problems: recent Perforated appendix  Obesity, morbid, BMI 50 or higher  Hypertension  Diabetes mellitus   Discharge Condition: Fair  Diet recommendation: Low sodium, diabetic  Filed Weights   08/22/12 1342  Weight: 190.1 kg (419 lb 1.5 oz)    History of present illness:  Please review admission H&P for details but in brief 47 year old morbidly obese male with history of hypertension and diabetes with recent perforated appendix management electively subsequently having chronic diarrhea was being prepped for a colonoscopy as outpatient when he was found to have rapid A. fib and sent to the ED. Patient admitted for further management.  Hospital Course:  afib with RVR  New onset.  - Likely triggered by underlying HTN. Patient placed on Cardizem and Coreg however heart rate poorly controlled and switched to long-acting Cardizem and increased dose of Coreg. Seen by [facility] cardiology and appreciate recommendations. 2D echo with EF of 55-60%. CHADS2 score of 2 . Patient awaiting follow up visit with his surgeon on 1/31 regarding his recent ruptured appendix.  -Placed on full dose aspirin. Will be placed on coumadin once surgical issue is resolved.  -His heart rate is well controlled on telemetry right now and asymptomatic. He'll followup with Dr. Harvel Ricks cardiology in 2 weeks -He would be discharged on 5 more days of by mouth Lasix 40 leg edema and can continue on a once daily basis as needed for leg edema and or shortness  of breath.  Chronic diarrhea  was scheduled for colonoscopy on 1/27 when he developed afib preop. Spoke with Dr Bosie Clos . Will follow as outpt   Diabetes mellitus   A1C of 6.8  Visit home medications  Hypertension  BP stable   Obesity Patient counseled on diet and regular exercise   Recent ruptured appendix Was managed conservatively. Patient will followup with his surgeon Dr. Johna Sheriff tomorrow to decide on surgery. Holding anticoagulation until this decision is made. He'll eventually need to be on Coumadin given his chest 2 score of 2.  Patient Clinically stable to be discharged home with outpatient followup  Code Status: full    Consultants:  [facility] Procedures:  2D echo Antibiotics:  none  Discharge Exam: Filed Vitals:   08/23/12 0741 08/23/12 1331 08/23/12 2143 08/24/12 0528  BP: 104/63 134/68 117/81 125/74  Pulse: 110 99 120 90  Temp:  98.4 F (36.9 C) 98 F (36.7 C) 97.9 F (36.6 C)  TempSrc:  Oral Oral Oral  Resp:  18 20 18   Height:      Weight:      SpO2:  100% 98% 98%   General: Middle aged morbidly obese male in NAD  HEENT: no pallor, moist oral mucosa  Cardiovascular: S1& S2 Irregular, rate controlled, no murmurs  Respiratory: equal breath sounds b/l, breath sounds poorly appreciated due to body habitus  Abdomen: Soft, NT, ND, BS+  Ext: warm, trace edema b/l  CNS: AAOX3   Discharge Instructions  Discharge Orders    Future Appointments: Provider: Department: Dept Phone: Center:   08/25/2012 1:45 PM Mariella Saa, MD [facility] [phone] None  Medication List     As of 08/24/2012 10:48 AM    TAKE these medications         acetaminophen 325 MG tablet   Commonly known as: TYLENOL   Take 650 mg by mouth every 6 (six) hours as needed. Pain      amoxicillin-clavulanate 875-125 MG per tablet   Commonly known as: AUGMENTIN   Take 1 tablet by mouth 2 (two) times daily.      aspirin 325 MG tablet    Take 1 tablet (325 mg total) by mouth daily.      carvedilol 6.25 MG tablet   Commonly known as: COREG   Take 1 tablet (6.25 mg total) by mouth 2 (two) times daily with a meal.      diltiazem 240 MG 24 hr capsule   Commonly known as: CARDIZEM CD   Take 1 capsule (240 mg total) by mouth daily.      furosemide 40 MG tablet   Commonly known as: LASIX   Take 1 tablet (40 mg total) by mouth daily.      glipiZIDE 10 MG tablet   Commonly known as: GLUCOTROL   Take 10 mg by mouth 2 (two) times daily before a meal.      metFORMIN 1000 MG tablet   Commonly known as: GLUCOPHAGE   Take 1,000 mg by mouth 2 (two) times daily with a meal.      naproxen sodium 220 MG tablet   Commonly known as: ANAPROX   Take 440 mg by mouth daily as needed. For pain      oxyCODONE-acetaminophen 5-325 MG per tablet   Commonly known as: PERCOCET/ROXICET   Take 1-2 tablets by mouth every 4 (four) hours as needed. Pain           Follow-up Information    Follow up with Burtis Junes, MD. In 1 week.   Contact information:   [facility] [place] [address] [phone]       Follow up with Elyn Aquas., MD. In 2 weeks. (will be called from office)    Contact information:   [address] [place] [address] [phone]       Follow up with Mariella Saa, MD. On 08/25/2012. (at 1:30 pm)    Contact information:   [address] [place] [address] [phone]           The results of significant diagnostics from this hospitalization (including imaging, microbiology, ancillary and laboratory) are listed below for reference.    Significant Diagnostic Studies: Dg Chest 2 View  08/22/2012  *RADIOLOGY REPORT*  Clinical Data: New onset of atrial fibrillation  CHEST - 2 VIEW  Comparison: 12/13/ 13  Findings: There is cardiomegaly new from prior exam.  No acute infiltrate or pleural effusion.  No pulmonary edema.  Study is limited by  patient's large body habitus.  IMPRESSION: Cardiomegaly.  No active disease.   Original Report Authenticated By: Natasha Mead, M.D.     Microbiology: No results found for this or any previous visit (from the past 240 hour(s)).   Labs: Basic Metabolic Panel:  Lab 08/23/12 6644 08/22/12 1223  NA 139 136  K 3.5 3.9  CL 104 100  CO2 28 25  GLUCOSE 163* 128*  BUN 16 13  CREATININE 0.97 0.87  CALCIUM 8.6 9.1  MG -- 2.0  PHOS -- --   Liver Function Tests: No results found for this basename: AST:5,ALT:5,ALKPHOS:5,BILITOT:5,PROT:5,ALBUMIN:5  in the last 168 hours No results found for this basename: LIPASE:5,AMYLASE:5 in the last 168 hours No results found for this basename: AMMONIA:5 in the last 168 hours CBC:  Lab 08/23/12 0430 08/22/12 1223  WBC 5.3 6.4  NEUTROABS -- --  HGB 13.2 14.2  HCT 40.1 42.6  MCV 92.8 93.0  PLT 233 250   Cardiac Enzymes:  Lab 08/22/12 1223  CKTOTAL --  CKMB --  CKMBINDEX --  TROPONINI <0.30   BNP: BNP (last 3 results) No results found for this basename: PROBNP:3 in the last 8760 hours CBG:  Lab 08/24/12 0726 08/23/12 2147 08/23/12 1729 08/23/12 1212 08/23/12 0734  GLUCAP 118* 148* 111* 86 207*       Signed:  Lauranne Beyersdorf  [facility] 08/24/2012, 10:48 AM

## 2012-08-25 ENCOUNTER — Encounter (INDEPENDENT_AMBULATORY_CARE_PROVIDER_SITE_OTHER): Payer: Self-pay

## 2012-08-25 ENCOUNTER — Telehealth (INDEPENDENT_AMBULATORY_CARE_PROVIDER_SITE_OTHER): Payer: Self-pay

## 2012-08-25 ENCOUNTER — Ambulatory Visit (INDEPENDENT_AMBULATORY_CARE_PROVIDER_SITE_OTHER): Payer: Medicare Other | Admitting: General Surgery

## 2012-08-25 ENCOUNTER — Encounter (INDEPENDENT_AMBULATORY_CARE_PROVIDER_SITE_OTHER): Payer: Self-pay | Admitting: General Surgery

## 2012-08-25 VITALS — BP 154/96 | HR 80 | Temp 97.2°F | Resp 22 | Ht 73.0 in | Wt >= 6400 oz

## 2012-08-25 DIAGNOSIS — K3532 Acute appendicitis with perforation and localized peritonitis, without abscess: Secondary | ICD-10-CM

## 2012-08-25 DIAGNOSIS — K352 Acute appendicitis with generalized peritonitis, without abscess: Secondary | ICD-10-CM

## 2012-08-25 NOTE — Telephone Encounter (Signed)
Pre-Op Cardiac Clearance faxed to Dr. Excell Seltzer @ [facility] [phone]

## 2012-08-25 NOTE — Progress Notes (Signed)
Chief complaint: Followup appendicitis  History: Patient returns for followup of perforated appendicitis with phlegmon hospitalized 6 weeks ago. He was treated nonoperatively. He went in for a planned colonoscopy earlier this week but developed rapid atrial fibrillation and this was canceled. He was just discharged yesterday. He reports his abdominal pain has steadily better. He still has some discomfort but is markedly improved.  Exam: BP 154/96  Pulse 80  Temp 97.2 F (36.2 C)  Resp 22  Ht 6\' 1"  (1.854 m)  Wt 427 lb 3.2 oz (193.777 kg)  BMI 56.36 kg/m2 General: No distress Abdomen: No appreciable tenderness  Assessment and plan: History of perforated appendicitis with phlegmon treated nonoperatively. We have previously discussed options of observation versus interval appendectomy and he prefers to have appendectomy to try to prevent further episodes. I will repeat the CT scan to make sure the area is improving. I would plan surgery in 6 or 8 weeks and will call her with the results of the CT. A biopsy of her preop visit. We will need to make sure his cardiac status is stable prior to surgery.

## 2012-08-25 NOTE — Addendum Note (Signed)
Addended by: Maryan Puls on: 08/25/2012 02:19 PM   Modules accepted: Orders

## 2012-09-04 ENCOUNTER — Encounter: Payer: Self-pay | Admitting: Nurse Practitioner

## 2012-09-04 ENCOUNTER — Ambulatory Visit (INDEPENDENT_AMBULATORY_CARE_PROVIDER_SITE_OTHER): Payer: Medicare Other | Admitting: Nurse Practitioner

## 2012-09-04 VITALS — BP 118/80 | Resp 20 | Ht 73.0 in | Wt >= 6400 oz

## 2012-09-04 DIAGNOSIS — Z01818 Encounter for other preprocedural examination: Secondary | ICD-10-CM

## 2012-09-04 DIAGNOSIS — I4891 Unspecified atrial fibrillation: Secondary | ICD-10-CM

## 2012-09-04 LAB — BASIC METABOLIC PANEL
BUN: 19 mg/dL (ref 6–23)
CO2: 29 mEq/L (ref 19–32)
Calcium: 8.9 mg/dL (ref 8.4–10.5)
Chloride: 102 mEq/L (ref 96–112)
Creatinine, Ser: 1.4 mg/dL (ref 0.4–1.5)
GFR: 69.91 mL/min (ref >60.00)
Glucose, Bld: 103 mg/dL — ABNORMAL HIGH (ref 70–99)
Potassium: 3.7 mEq/L (ref 3.5–5.1)
Sodium: 140 mEq/L (ref 135–145)

## 2012-09-04 NOTE — Patient Instructions (Addendum)
Stay on your current medicines  We are checking labs today  You are back in a regular rhythm today - but we will still need to start coumadin once you get your surgery and recover  We will see you back after your surgery in about 4 weeks.   Coumadin will need to be started once cleared by General Surgery  See Dr. Elease Hashimoto in 4 weeks  Call the [facility] office at [phone] if you have any questions, problems or concerns.

## 2012-09-04 NOTE — Progress Notes (Addendum)
Randall Hiss. Date of Birth: February 02, 1966 Medical Record #[id number]  History of Present Illness: [name] is seen back today for a post hospital visit. He is seen for Dr. Nahser/Cooper. He has DM, morbid obesity, HTN and chronic back pain. Had a ruptured appendix back in December and treated conservatively. Having lots of diarrhea and went to have endoscopy but was in atrial fib with RVR. He was asymptomatic. His CHADs is at least 2 (HTN/DM). Normal LV function per echo. Now needing to have surgery - lap appendectomy. Plan was for coumadin initiation afterwards.  He had not had any chest pain/shortness of breath. He is able to walk up 2 flights of steps without difficulty.   He comes in today. He is here alone. Sees Dr. Johna Sheriff later this week with a CT scan planned. He is doing well clinically. He denies chest pain, shortness of breath, palpitations and dizziness. Not aware of his rhythm. Is actively losing weight. Tolerating his medicines. Diarrhea is better. Still on antibiotics.   Current Outpatient Prescriptions on File Prior to Visit  Medication Sig Dispense Refill  . acetaminophen (TYLENOL) 325 MG tablet Take 650 mg by mouth every 6 (six) hours as needed. Pain      . amoxicillin-clavulanate (AUGMENTIN) 875-125 MG per tablet Take 1 tablet by mouth 2 (two) times daily.      Marland Kitchen aspirin 325 MG tablet Take 1 tablet (325 mg total) by mouth daily.  30 tablet  0  . carvedilol (COREG) 6.25 MG tablet Take 1 tablet (6.25 mg total) by mouth 2 (two) times daily with a meal.  60 tablet  0  . diltiazem (CARDIZEM CD) 240 MG 24 hr capsule Take 1 capsule (240 mg total) by mouth daily.  30 capsule  0  . furosemide (LASIX) 40 MG tablet Take 1 tablet (40 mg total) by mouth daily.  30 tablet  0  . glipiZIDE (GLUCOTROL) 10 MG tablet Take 10 mg by mouth 2 (two) times daily before a meal.       . metFORMIN (GLUCOPHAGE) 1000 MG tablet Take 1,000 mg by mouth 2 (two) times daily with a meal.      .  naproxen sodium (ANAPROX) 220 MG tablet Take 440 mg by mouth daily as needed. For pain      . oxyCODONE-acetaminophen (PERCOCET/ROXICET) 5-325 MG per tablet Take 1-2 tablets by mouth every 4 (four) hours as needed. Pain       No current facility-administered medications on file prior to visit.    No Known Allergies  Past Medical History  Diagnosis Date  . Diabetes mellitus   . Morbid obesity   . Lumbar herniated disc   . 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 Obesity, morbid, BMI 50 or higher 07/13/2012  . Hypertension 07/13/2012  . Diabetes mellitus 07/13/2012  . Back pain, chronic 07/13/2012    Past Surgical History  Procedure Laterality Date  . Abscess drainage    . Dental surgery    . Colonoscopy  08/22/2012    Procedure: COLONOSCOPY;  Surgeon: Shirley Friar, MD;  Location: [facility];  Service: Endoscopy;  Laterality: N/A;    History  Smoking status  . Never Smoker   Smokeless tobacco  . Never Used    History  Alcohol Use No    Comment: rarely    History reviewed. No pertinent family history.  Review of Systems: The review of systems is per the HPI.  All other systems were reviewed and are negative.  Physical Exam: BP 118/80  Resp 20  Ht 6\' 1"  (1.854 m)  Wt 409 lb (185.521 kg)  BMI 53.97 kg/m2 Patient is very pleasant and in no acute distress. He is morbidly obese. Skin is warm and dry. Color is normal.  HEENT is unremarkable. Normocephalic/atraumatic. PERRL. Sclera are nonicteric. Neck is supple. No masses. No JVD. Lungs are clear. Cardiac exam shows a regula rhythm. His rate was just a little fast on my exam - but down to 97 on EKG. Abdomen is obese but soft. Extremities are full but without edema. Gait and ROM are intact. No gross neurologic deficits noted.  LABORATORY DATA: BMET is pending  EKG today shows sinus rhythm. Rate is 97. Still has septal Q's with poor R wave progression - unchanged.   Lab Results  Component Value Date   WBC 5.3  08/23/2012   HGB 13.2 08/23/2012   HCT 40.1 08/23/2012   PLT 233 08/23/2012   GLUCOSE 163* 08/23/2012   CHOL  Value: 159        ATP III CLASSIFICATION:  <200     mg/dL   Desirable  295-621  mg/dL   Borderline High  >=308    mg/dL   High        6/57/8469   TRIG 58 02/11/2010   HDL 59 02/11/2010   LDLCALC  Value: 88        Total Cholesterol/HDL:CHD Risk Coronary Heart Disease Risk Table                     Men   Women  1/2 Average Risk   3.4   3.3  Average Risk       5.0   4.4  2 X Average Risk   9.6   7.1  3 X Average Risk  23.4   11.0        Use the calculated Patient Ratio above and the CHD Risk Table to determine the patient's CHD Risk.        ATP III CLASSIFICATION (LDL):  <100     mg/dL   Optimal  629-528  mg/dL   Near or Above                    Optimal  130-159  mg/dL   Borderline  413-244  mg/dL   High  >010     mg/dL   Very High 2/72/5366   ALT 33 02/11/2010   AST 28 02/11/2010   NA 139 08/23/2012   K 3.5 08/23/2012   CL 104 08/23/2012   CREATININE 0.97 08/23/2012   BUN 16 08/23/2012   CO2 28 08/23/2012   TSH 2.917 02/11/2010   PSA 0.22 Test Methodology: Hybritech PSA 02/11/2010   INR 1.06 08/22/2012   HGBA1C 6.8* 08/22/2012   MICROALBUR 0.99 02/11/2010   Echo Study Conclusions  - Left ventricle: The cavity size was at the upper limits of normal. Systolic function was normal. The estimated ejection fraction was in the range of 55% to 60%. Regional wall motion abnormalities cannot be excluded. - Left atrium: The atrium was mildly to moderately dilated. - Right atrium: The atrium was mildly dilated.  Assessment / Plan: 1. Atrial fib - back in sinus today. CHADs remains a 2. Coumadin would still need to be started once he has recovered from his surgery. No change in his medicines today.   2. Pre operative clearance - needs lap appendectomy - he is felt to be an acceptable  candidate - he has no active chest pain/shortness of breath/syncope.   3. HTN - BP looks good today.   4. Morbid obesity -  actively losing weight per his report  Dr. Elease Hashimoto will see him back in 4 weeks - should have had his surgery and be well on his way with recovery at that time. Will recheck an EKG at that visit. Start Coumadin when ok with General Surgery. We can either follow or defer to his PCP.   Patient is agreeable to this plan and will call if any problems develop in the interim.

## 2012-09-08 ENCOUNTER — Other Ambulatory Visit: Payer: Medicare Other

## 2012-09-13 ENCOUNTER — Other Ambulatory Visit: Payer: Medicare Other

## 2012-09-15 ENCOUNTER — Ambulatory Visit
Admission: RE | Admit: 2012-09-15 | Discharge: 2012-09-15 | Disposition: A | Discharge status: Home / Self Care | Payer: Medicare Other | Source: Ambulatory Visit | Attending: General Surgery | Admitting: General Surgery

## 2012-09-15 DIAGNOSIS — K3532 Acute appendicitis with perforation and localized peritonitis, without abscess: Secondary | ICD-10-CM

## 2012-09-15 MED ORDER — IOHEXOL 300 MG/ML  SOLN: 125.0000 mL | Freq: Once | INTRAMUSCULAR | Status: AC | PRN

## 2012-10-03 ENCOUNTER — Telehealth (INDEPENDENT_AMBULATORY_CARE_PROVIDER_SITE_OTHER): Payer: Self-pay | Admitting: General Surgery

## 2012-10-03 NOTE — Telephone Encounter (Signed)
Pt called to ask for additional pain med; he is taking Percocet 5/325 mg.  Last OV 08/25/12 with CT done on 09/15/12.  Paged Dr. Johna Sheriff for orders.

## 2012-10-03 NOTE — Telephone Encounter (Signed)
After discussion with Dr. Johna Sheriff, the pt will go to the ED now, for pain management and possible CT repeat.

## 2012-10-06 ENCOUNTER — Ambulatory Visit: Payer: Medicare Other | Admitting: Cardiovascular Disease

## 2012-10-06 ENCOUNTER — Other Ambulatory Visit: Payer: Self-pay | Admitting: *Deleted

## 2012-12-22 ENCOUNTER — Encounter: Payer: Self-pay | Admitting: Cardiovascular Disease

## 2012-12-22 ENCOUNTER — Ambulatory Visit: Payer: Medicare Other | Admitting: Cardiovascular Disease

## 2012-12-22 NOTE — Progress Notes (Signed)
This encounter was created in error - please disregard.

## 2013-01-17 ENCOUNTER — Encounter: Payer: Self-pay | Admitting: Cardiovascular Disease

## 2013-03-06 ENCOUNTER — Encounter: Payer: Self-pay | Admitting: Cardiovascular Disease

## 2013-09-27 ENCOUNTER — Encounter (HOSPITAL_COMMUNITY): Payer: Self-pay | Admitting: Emergency Medicine

## 2013-09-27 ENCOUNTER — Inpatient Hospital Stay (HOSPITAL_COMMUNITY)
Admission: EM | Admit: 2013-09-27 | Discharge: 2013-10-07 | DRG: 292 | Disposition: A | Discharge status: Home / Self Care | Payer: Medicare Other | Attending: Cardiology | Admitting: Cardiology

## 2013-09-27 ENCOUNTER — Emergency Department (HOSPITAL_COMMUNITY): Payer: Medicare Other

## 2013-09-27 ENCOUNTER — Telehealth: Payer: Self-pay | Admitting: Cardiology

## 2013-09-27 DIAGNOSIS — E119 Type 2 diabetes mellitus without complications: Secondary | ICD-10-CM

## 2013-09-27 DIAGNOSIS — K3532 Acute appendicitis with perforation and localized peritonitis, without abscess: Secondary | ICD-10-CM

## 2013-09-27 DIAGNOSIS — Z6841 Body Mass Index (BMI) 40.0 and over, adult: Secondary | ICD-10-CM

## 2013-09-27 DIAGNOSIS — Z9119 Patient's noncompliance with other medical treatment and regimen: Secondary | ICD-10-CM

## 2013-09-27 DIAGNOSIS — Z7901 Long term (current) use of anticoagulants: Secondary | ICD-10-CM

## 2013-09-27 DIAGNOSIS — I10 Essential (primary) hypertension: Secondary | ICD-10-CM | POA: Diagnosis present

## 2013-09-27 DIAGNOSIS — IMO0002 Reserved for concepts with insufficient information to code with codable children: Secondary | ICD-10-CM | POA: Diagnosis present

## 2013-09-27 DIAGNOSIS — I160 Hypertensive urgency: Secondary | ICD-10-CM

## 2013-09-27 DIAGNOSIS — R197 Diarrhea, unspecified: Secondary | ICD-10-CM

## 2013-09-27 DIAGNOSIS — I472 Ventricular tachycardia, unspecified: Secondary | ICD-10-CM | POA: Diagnosis not present

## 2013-09-27 DIAGNOSIS — E118 Type 2 diabetes mellitus with unspecified complications: Secondary | ICD-10-CM

## 2013-09-27 DIAGNOSIS — Z9889 Other specified postprocedural states: Secondary | ICD-10-CM

## 2013-09-27 DIAGNOSIS — I4729 Other ventricular tachycardia: Secondary | ICD-10-CM | POA: Diagnosis not present

## 2013-09-27 DIAGNOSIS — E1165 Type 2 diabetes mellitus with hyperglycemia: Secondary | ICD-10-CM | POA: Diagnosis present

## 2013-09-27 DIAGNOSIS — G4733 Obstructive sleep apnea (adult) (pediatric): Secondary | ICD-10-CM | POA: Diagnosis present

## 2013-09-27 DIAGNOSIS — M549 Dorsalgia, unspecified: Secondary | ICD-10-CM | POA: Diagnosis present

## 2013-09-27 DIAGNOSIS — IMO0001 Reserved for inherently not codable concepts without codable children: Secondary | ICD-10-CM | POA: Diagnosis present

## 2013-09-27 DIAGNOSIS — Z91199 Patient's noncompliance with other medical treatment and regimen due to unspecified reason: Secondary | ICD-10-CM

## 2013-09-27 DIAGNOSIS — Z7982 Long term (current) use of aspirin: Secondary | ICD-10-CM

## 2013-09-27 DIAGNOSIS — Z79899 Other long term (current) drug therapy: Secondary | ICD-10-CM

## 2013-09-27 DIAGNOSIS — I4891 Unspecified atrial fibrillation: Secondary | ICD-10-CM | POA: Diagnosis present

## 2013-09-27 DIAGNOSIS — I509 Heart failure, unspecified: Secondary | ICD-10-CM | POA: Diagnosis present

## 2013-09-27 DIAGNOSIS — I4892 Unspecified atrial flutter: Secondary | ICD-10-CM | POA: Diagnosis present

## 2013-09-27 DIAGNOSIS — I5041 Acute combined systolic (congestive) and diastolic (congestive) heart failure: Secondary | ICD-10-CM | POA: Diagnosis present

## 2013-09-27 DIAGNOSIS — G8929 Other chronic pain: Secondary | ICD-10-CM | POA: Diagnosis present

## 2013-09-27 DIAGNOSIS — I5043 Acute on chronic combined systolic (congestive) and diastolic (congestive) heart failure: Principal | ICD-10-CM | POA: Diagnosis present

## 2013-09-27 HISTORY — DX: Cardiac arrhythmia, unspecified: I49.9

## 2013-09-27 HISTORY — DX: Adverse effect of unspecified anesthetic, initial encounter: T41.45XA

## 2013-09-27 HISTORY — DX: Shortness of breath: R06.02

## 2013-09-27 HISTORY — DX: Long term (current) use of anticoagulants: Z79.01

## 2013-09-27 HISTORY — DX: Other complications of anesthesia, initial encounter: T88.59XA

## 2013-09-27 HISTORY — DX: Heart failure, unspecified: I50.9

## 2013-09-27 LAB — CBC WITH DIFFERENTIAL/PLATELET
Basophils Absolute: 0 10*3/uL (ref 0.0–0.1)
Basophils Relative: 0 % (ref 0–1)
Eosinophils Absolute: 0.1 10*3/uL (ref 0.0–0.7)
Eosinophils Relative: 2 % (ref 0–5)
HCT: 38.5 % — ABNORMAL LOW (ref 39.0–52.0)
Hemoglobin: 13 g/dL (ref 13.0–17.0)
Lymphocytes Relative: 24 % (ref 12–46)
Lymphs Abs: 2 10*3/uL (ref 0.7–4.0)
MCH: 31.6 pg (ref 26.0–34.0)
MCHC: 33.8 g/dL (ref 30.0–36.0)
MCV: 93.4 fL (ref 78.0–100.0)
Monocytes Absolute: 0.7 10*3/uL (ref 0.1–1.0)
Monocytes Relative: 9 % (ref 3–12)
Neutro Abs: 5.3 10*3/uL (ref 1.7–7.7)
Neutrophils Relative %: 65 % (ref 43–77)
Platelets: 245 10*3/uL (ref 150–400)
RBC: 4.12 MIL/uL — ABNORMAL LOW (ref 4.22–5.81)
RDW: 13.6 % (ref 11.5–15.5)
WBC: 8.1 10*3/uL (ref 4.0–10.5)

## 2013-09-27 LAB — BASIC METABOLIC PANEL
BUN: 12 mg/dL (ref 6–23)
CO2: 25 mEq/L (ref 19–32)
Calcium: 8.7 mg/dL (ref 8.4–10.5)
Chloride: 103 mEq/L (ref 96–112)
Creatinine, Ser: 0.95 mg/dL (ref 0.50–1.35)
GFR calc Af Amer: >90 mL/min (ref >90)
GFR calc non Af Amer: >90 mL/min (ref >90)
Glucose, Bld: 116 mg/dL — ABNORMAL HIGH (ref 70–99)
Potassium: 5.1 mEq/L (ref 3.7–5.3)
Sodium: 140 mEq/L (ref 137–147)

## 2013-09-27 LAB — MAGNESIUM: Magnesium: 1.9 mg/dL (ref 1.5–2.5)

## 2013-09-27 LAB — PROTIME-INR
INR: 1.04 (ref 0.00–1.49)
Prothrombin Time: 13.4 seconds (ref 11.6–15.2)

## 2013-09-27 LAB — GLUCOSE, CAPILLARY
Glucose-Capillary: 112 mg/dL — ABNORMAL HIGH (ref 70–99)
Glucose-Capillary: 99 mg/dL (ref 70–99)

## 2013-09-27 LAB — I-STAT TROPONIN, ED: Troponin i, poc: 0 ng/mL (ref 0.00–0.08)

## 2013-09-27 LAB — TROPONIN I: Troponin I: <0.3 ng/mL (ref <0.30)

## 2013-09-27 LAB — D-DIMER, QUANTITATIVE: D-Dimer, Quant: 0.51 ug/mL-FEU — ABNORMAL HIGH (ref 0.00–0.48)

## 2013-09-27 LAB — CBG MONITORING, ED: Glucose-Capillary: 101 mg/dL — ABNORMAL HIGH (ref 70–99)

## 2013-09-27 LAB — APTT: aPTT: 31 seconds (ref 24–37)

## 2013-09-27 LAB — PRO B NATRIURETIC PEPTIDE: Pro B Natriuretic peptide (BNP): 1185 pg/mL — ABNORMAL HIGH (ref 0–125)

## 2013-09-27 MED ORDER — RIVAROXABAN 20 MG PO TABS
20.0000 mg | ORAL_TABLET | Freq: Every day | ORAL | Status: DC
  Administered 2013-09-27 – 2013-10-07 (×11): 20 mg via ORAL
  Filled 2013-09-27 (×11): qty 1

## 2013-09-27 MED ORDER — DILTIAZEM HCL 100 MG IV SOLR
10.0000 mg/h | INTRAVENOUS | Status: DC
  Administered 2013-09-27 – 2013-09-29 (×5): 10 mg/h via INTRAVENOUS
  Filled 2013-09-27 (×4): qty 100

## 2013-09-27 MED ORDER — GLIPIZIDE 10 MG PO TABS
10.0000 mg | ORAL_TABLET | Freq: Two times a day (BID) | ORAL | Status: DC
  Administered 2013-09-28 – 2013-10-07 (×20): 10 mg via ORAL
  Filled 2013-09-27 (×21): qty 1

## 2013-09-27 MED ORDER — SODIUM CHLORIDE 0.9 % IJ SOLN
3.0000 mL | Freq: Two times a day (BID) | INTRAMUSCULAR | Status: DC
  Administered 2013-09-29 – 2013-10-07 (×17): 3 mL via INTRAVENOUS

## 2013-09-27 MED ORDER — POTASSIUM CHLORIDE CRYS ER 20 MEQ PO TBCR
20.0000 meq | EXTENDED_RELEASE_TABLET | Freq: Two times a day (BID) | ORAL | Status: DC
  Administered 2013-09-27 – 2013-10-07 (×20): 20 meq via ORAL
  Filled 2013-09-27 (×23): qty 1

## 2013-09-27 MED ORDER — ASPIRIN EC 81 MG PO TBEC
81.0000 mg | DELAYED_RELEASE_TABLET | Freq: Every day | ORAL | Status: DC
  Administered 2013-09-28 – 2013-10-07 (×10): 81 mg via ORAL
  Filled 2013-09-27 (×11): qty 1

## 2013-09-27 MED ORDER — DILTIAZEM LOAD VIA INFUSION
15.0000 mg | Freq: Once | INTRAVENOUS | Status: AC
  Administered 2013-09-27: 15 mg via INTRAVENOUS
  Filled 2013-09-27: qty 15

## 2013-09-27 MED ORDER — CARVEDILOL 3.125 MG PO TABS
3.1250 mg | ORAL_TABLET | Freq: Two times a day (BID) | ORAL | Status: DC
  Administered 2013-09-28: 3.125 mg via ORAL
  Filled 2013-09-27 (×3): qty 1

## 2013-09-27 MED ORDER — INSULIN ASPART 100 UNIT/ML ~~LOC~~ SOLN
0.0000 [IU] | Freq: Every day | SUBCUTANEOUS | Status: DC
  Administered 2013-09-30: 2 [IU] via SUBCUTANEOUS

## 2013-09-27 MED ORDER — RAMIPRIL 2.5 MG PO CAPS
2.5000 mg | ORAL_CAPSULE | Freq: Two times a day (BID) | ORAL | Status: DC
  Administered 2013-09-27 – 2013-10-07 (×20): 2.5 mg via ORAL
  Filled 2013-09-27 (×23): qty 1

## 2013-09-27 MED ORDER — INSULIN ASPART 100 UNIT/ML ~~LOC~~ SOLN
0.0000 [IU] | Freq: Three times a day (TID) | SUBCUTANEOUS | Status: DC
  Administered 2013-09-28: 2 [IU] via SUBCUTANEOUS
  Administered 2013-09-28: 3 [IU] via SUBCUTANEOUS
  Administered 2013-09-28: 2 [IU] via SUBCUTANEOUS
  Administered 2013-09-29 – 2013-09-30 (×4): 3 [IU] via SUBCUTANEOUS
  Administered 2013-09-30: 2 [IU] via SUBCUTANEOUS
  Administered 2013-09-30: 3 [IU] via SUBCUTANEOUS
  Administered 2013-10-01: 5 [IU] via SUBCUTANEOUS
  Administered 2013-10-01 (×2): 2 [IU] via SUBCUTANEOUS
  Administered 2013-10-02 – 2013-10-03 (×4): 3 [IU] via SUBCUTANEOUS
  Administered 2013-10-03: 2 [IU] via SUBCUTANEOUS
  Administered 2013-10-03 – 2013-10-04 (×2): 3 [IU] via SUBCUTANEOUS
  Administered 2013-10-04: 2 [IU] via SUBCUTANEOUS
  Administered 2013-10-04 – 2013-10-05 (×2): 3 [IU] via SUBCUTANEOUS
  Administered 2013-10-05: 2 [IU] via SUBCUTANEOUS
  Administered 2013-10-06: 3 [IU] via SUBCUTANEOUS
  Administered 2013-10-06: 2 [IU] via SUBCUTANEOUS
  Administered 2013-10-07: 3 [IU] via SUBCUTANEOUS
  Administered 2013-10-07: 5 [IU] via SUBCUTANEOUS

## 2013-09-27 MED ORDER — SODIUM CHLORIDE 0.9 % IJ SOLN
3.0000 mL | INTRAMUSCULAR | Status: DC | PRN
  Administered 2013-10-05: 3 mL via INTRAVENOUS

## 2013-09-27 MED ORDER — FUROSEMIDE 10 MG/ML IJ SOLN
40.0000 mg | Freq: Two times a day (BID) | INTRAMUSCULAR | Status: DC
  Administered 2013-09-27 – 2013-09-29 (×4): 40 mg via INTRAVENOUS
  Filled 2013-09-27 (×5): qty 4

## 2013-09-27 MED ORDER — ASPIRIN 81 MG PO CHEW
324.0000 mg | CHEWABLE_TABLET | Freq: Once | ORAL | Status: AC
  Administered 2013-09-27: 324 mg via ORAL
  Filled 2013-09-27: qty 4

## 2013-09-27 MED ORDER — FUROSEMIDE 10 MG/ML IJ SOLN
40.0000 mg | INTRAMUSCULAR | Status: AC
  Administered 2013-09-27: 40 mg via INTRAVENOUS
  Filled 2013-09-27: qty 4

## 2013-09-27 MED ORDER — SODIUM CHLORIDE 0.9 % IV SOLN
250.0000 mL | INTRAVENOUS | Status: DC | PRN
  Administered 2013-10-03: 250 mL via INTRAVENOUS

## 2013-09-27 MED ORDER — NITROGLYCERIN 0.4 MG SL SUBL
0.4000 mg | SUBLINGUAL_TABLET | SUBLINGUAL | Status: DC | PRN
  Administered 2013-09-27: 0.4 mg via SUBLINGUAL
  Filled 2013-09-27: qty 1

## 2013-09-27 NOTE — H&P (Signed)
Patient seen, records reveiwed, patient interviewed and examined and agree with not as outlined by Nada Boozer, NP.  Patient has history of afib and was in NSR at last OV in early 2014 but never showed back up for appendectomy or to get on coumadin.  Presents today with several episodes of palpitations over the past year and recently increased LE edema over several weeks.  Last PM developed SOB and could not lay down and presented to the ER in hypertensive urgency, afib with RVR and acute CHF.  Chest xray c/w CHF and BNP elevated.  Suspect possble tachycardia or hypertensive induced DCM.  He also has severe snoring and wakes himself up frequently at night.  Will admit to CHF telemetry unit.  Cycle cardiac enzymes.  Start Xarelto for CHADs2Vasc score of 2.  With his history of medical noncompliance I would not recommend coumadin.  I will order an outpt sleep study.  Start IV Cardizem gtt for rate control and BP control.  2D echo to assess LVF.  Add low dose ACE I and coreg for hypertensive urgency.  Check TSH.  Further workup pending results of 2D echo.

## 2013-09-27 NOTE — Telephone Encounter (Signed)
Patient seen in hospitalized for afib with RVR and needs sleep study for sleep apnea.  Please set up for in the next 2 weeks

## 2013-09-27 NOTE — ED Notes (Signed)
Pt instructed to use urinal. Pt insists on getting up to bathroom. Pt informed of safety measures needed when taking Lasix. Pt still insists on getting up to bathroom. Pt instructed to call RN or NT for assistance to bathroom.

## 2013-09-27 NOTE — ED Notes (Signed)
Pt presents with sudden onset of shortness of breath.  Pt denies any cough, denies any chest pain.

## 2013-09-27 NOTE — ED Notes (Signed)
[employer] secretary called to place bed request.

## 2013-09-27 NOTE — Progress Notes (Signed)
ANTICOAGULATION CONSULT NOTE - Initial Consult  Pharmacy Consult for Xarelto Indication: nonvalvular atrial fibrillation  No Known Allergies  Patient Measurements: Height: 6' 0.83" (185 cm) Weight: 400 lb (181.439 kg) IBW/kg (Calculated) : 79.52  Vital Signs: Temp: 99 F (37.2 C) (03/05 1123) Temp src: Oral (03/05 1123) BP: 126/77 mmHg (03/05 1415) Pulse Rate: 108 (03/05 1415)  Labs:  Recent Labs  09/27/13 1225  HGB 13.0  HCT 38.5*  PLT 245  CREATININE 0.95    Estimated Creatinine Clearance: 160 ml/min (by C-G formula based on Cr of 0.95).   Medical History: Past Medical History  Diagnosis Date  . Diabetes mellitus   . Morbid obesity   . Lumbar herniated disc   . 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 Obesity, morbid, BMI 50 or higher 07/13/2012  . Hypertension 07/13/2012  . Diabetes mellitus 07/13/2012  . Back pain, chronic 07/13/2012    Medications:  Scheduled:  . insulin aspart  0-15 Units Subcutaneous TID WC  . insulin aspart  0-5 Units Subcutaneous QHS  . ramipril  2.5 mg Oral BID   Infusions:  . diltiazem (CARDIZEM) infusion 10 mg/hr (09/27/13 1449)    Assessment: 48 yo morbidly obese AAM presents to ED with SOB, hypertensive urgency, afib w/RVR, and LE edema (non-compliance on his lasix).  Patient reports palpitation episodes over last year and last April was found in afib during admission, then lost to follow-up.  Coumadin is not considered in this patient d/t medical non-compliance.  Initial labs reveal H/H of 13/38.5 and PLT of 245.  SCr is 0.95 with an estimated CrCl >120.  No dosage adjustment is necessary for obesity.  Goal of Therapy:  Full anticoagulation with compliance Monitor platelets by anticoagulation protocol: Yes   Plan:  - start Xarelto 20mg  once daily with evening meal - monitor for s/s of bleeding  Harrold Donath E. Achilles Dunk, PharmD Clinical Pharmacist - Resident Pager: [phone] Pharmacy: [phone] 09/27/2013 4:31 PM

## 2013-09-27 NOTE — Progress Notes (Signed)
Pt transferred to room 3E07 via stretcher. Pt alert and oriented x 4. Assessment completed. Pt oriented to room and unit. Call bell in reach. Family at bedside. Will continue to monitor pt closely.  Juliane Lack, RN

## 2013-09-27 NOTE — ED Provider Notes (Signed)
Patient seen/examined in the Emergency Department in conjunction with Midlevel Provider [facility] Patient reports SOB Exam : awake/alert, he is tachpneic, appears to be in afib Plan: likely admit for afib and probable CHF   Joya Gaskins, MD 09/27/13 1312

## 2013-09-27 NOTE — ED Provider Notes (Signed)
CSN: [id number]     Arrival date & time 09/27/13  1105 History   First MD Initiated Contact with Patient 09/27/13 1129     No chief complaint on file.    (Consider location/radiation/quality/duration/timing/severity/associated sxs/prior Treatment) HPI Comments: Patient presents to the emergency department with chief complaint of shortness of breath. He states that the onset has been over the past couple of days. He reports needing to sleep sitting up. He states that he is more short of breath while lying down. He has not tried taking anything to alleviate his symptoms. He denies any chest pain, or abdominal pain. Denies cough, or fever. He has seen cardiology in the past, but does not follow up regularly. Past medical history remarkable for diabetes, hypertension, and obesity.  The history is provided by the patient. No language interpreter was used.    Past Medical History  Diagnosis Date  . Diabetes mellitus   . Morbid obesity   . Lumbar herniated disc   . 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 Obesity, morbid, BMI 50 or higher 07/13/2012  . Hypertension 07/13/2012  . Diabetes mellitus 07/13/2012  . Back pain, chronic 07/13/2012   Past Surgical History  Procedure Laterality Date  . Abscess drainage    . Dental surgery    . Colonoscopy  08/22/2012    Procedure: COLONOSCOPY;  Surgeon: Shirley FriarVincent C. Schooler, MD;  Location: [facility];  Service: Endoscopy;  Laterality: N/A;   History reviewed. No pertinent family history. History  Substance Use Topics  . Smoking status: Never Smoker   . Smokeless tobacco: Never Used  . Alcohol Use: No     Comment: rarely    Review of Systems  All other systems reviewed and are negative.      Allergies  Review of patient's allergies indicates no known allergies.  Home Medications   Current Outpatient Rx  Name  Route  Sig  Dispense  Refill  . acetaminophen (TYLENOL) 325 MG tablet   Oral   Take 650 mg by mouth every 6 (six) hours as  needed. Pain         . amoxicillin-clavulanate (AUGMENTIN) 875-125 MG per tablet   Oral   Take 1 tablet by mouth 2 (two) times daily.         Marland Kitchen. aspirin 325 MG tablet   Oral   Take 1 tablet (325 mg total) by mouth daily.   30 tablet   0   . carvedilol (COREG) 6.25 MG tablet   Oral   Take 1 tablet (6.25 mg total) by mouth 2 (two) times daily with a meal.   60 tablet   0   . diltiazem (CARDIZEM CD) 240 MG 24 hr capsule   Oral   Take 1 capsule (240 mg total) by mouth daily.   30 capsule   0   . furosemide (LASIX) 40 MG tablet   Oral   Take 1 tablet (40 mg total) by mouth daily.   30 tablet   0     Please take 1 tablet daily for 5 days then once da ...   . glipiZIDE (GLUCOTROL) 10 MG tablet   Oral   Take 10 mg by mouth 2 (two) times daily before a meal.          . metFORMIN (GLUCOPHAGE) 1000 MG tablet   Oral   Take 1,000 mg by mouth 2 (two) times daily with a meal.         . naproxen sodium (ANAPROX) 220 MG  tablet   Oral   Take 440 mg by mouth daily as needed. For pain         . oxyCODONE-acetaminophen (PERCOCET/ROXICET) 5-325 MG per tablet   Oral   Take 1-2 tablets by mouth every 4 (four) hours as needed. Pain          BP 156/99  Pulse 120  Temp(Src) 99 F (37.2 C) (Oral)  Resp 22  SpO2 98% Physical Exam  Nursing note and vitals reviewed. Constitutional: He is oriented to person, place, and time. He appears well-developed and well-nourished.  Morbidly obese  HENT:  Head: Normocephalic and atraumatic.  Eyes: Conjunctivae and EOM are normal. Pupils are equal, round, and reactive to light. Right eye exhibits no discharge. Left eye exhibits no discharge. No scleral icterus.  Neck: Normal range of motion. Neck supple. No JVD present.  Cardiovascular: Regular rhythm and normal heart sounds.  Exam reveals no gallop and no friction rub.   No murmur heard. Tachycardic  Pulmonary/Chest: Effort normal and breath sounds normal. No respiratory distress. He  has no wheezes. He has no rales. He exhibits no tenderness.  No respiratory distress, speaks in full sentences  Abdominal: Soft. He exhibits no distension and no mass. There is no tenderness. There is no rebound and no guarding.  Musculoskeletal: Normal range of motion. He exhibits edema. He exhibits no tenderness.  2+ bilateral lower extremity edema  Neurological: He is alert and oriented to person, place, and time.  Skin: Skin is warm and dry.  Psychiatric: He has a normal mood and affect. His behavior is normal. Judgment and thought content normal.    ED Course  Procedures (including critical care time) Results for orders placed during the hospital encounter of 09/27/13  CBC WITH DIFFERENTIAL      Result Value Ref Range   WBC 8.1  4.0 - 10.5 K/uL   RBC 4.12 (*) 4.22 - 5.81 MIL/uL   Hemoglobin 13.0  13.0 - 17.0 g/dL   HCT 81.1 (*) 91.4 - 78.2 %   MCV 93.4  78.0 - 100.0 fL   MCH 31.6  26.0 - 34.0 pg   MCHC 33.8  30.0 - 36.0 g/dL   RDW 95.6  21.3 - 08.6 %   Platelets 245  150 - 400 K/uL   Neutrophils Relative % 65  43 - 77 %   Neutro Abs 5.3  1.7 - 7.7 K/uL   Lymphocytes Relative 24  12 - 46 %   Lymphs Abs 2.0  0.7 - 4.0 K/uL   Monocytes Relative 9  3 - 12 %   Monocytes Absolute 0.7  0.1 - 1.0 K/uL   Eosinophils Relative 2  0 - 5 %   Eosinophils Absolute 0.1  0.0 - 0.7 K/uL   Basophils Relative 0  0 - 1 %   Basophils Absolute 0.0  0.0 - 0.1 K/uL  BASIC METABOLIC PANEL      Result Value Ref Range   Sodium 140  137 - 147 mEq/L   Potassium 5.1  3.7 - 5.3 mEq/L   Chloride 103  96 - 112 mEq/L   CO2 25  19 - 32 mEq/L   Glucose, Bld 116 (*) 70 - 99 mg/dL   BUN 12  6 - 23 mg/dL   Creatinine, Ser 5.78  0.50 - 1.35 mg/dL   Calcium 8.7  8.4 - 46.9 mg/dL   GFR calc non Af Amer >90  >90 mL/min   GFR calc Af Amer >90  >  90 mL/min  PRO B NATRIURETIC PEPTIDE      Result Value Ref Range   Pro B Natriuretic peptide (BNP) 1185.0 (*) 0 - 125 pg/mL  I-STAT [place], ED      Result Value  Ref Range   Troponin i, poc 0.00  0.00 - 0.08 ng/mL   Comment 3            Dg Chest Portable 1 View  09/27/2013   CLINICAL DATA:  Shortness of Breath  EXAM: PORTABLE CHEST - 1 VIEW  COMPARISON:  August 22, 2012  FINDINGS: Lungs are clear. There is moderate cardiomegaly with normal pulmonary vascularity. No adenopathy. No bone lesions.  IMPRESSION: Generalized cardiomegaly, a finding also present previously. No apparent edema or consolidation.   Electronically Signed   By: Bretta Bang M.D.   On: 09/27/2013 12:17      EKG Interpretation   Date/Time:  Thursday September 27 2013 11:19:03 EST Ventricular Rate:  105 PR Interval:    QRS Duration: 80 QT Interval:  296 QTC Calculation: 391 R Axis:   -43 Text Interpretation:  Atrial fibrillation with rapid ventricular response  Left axis deviation Inferior infarct , age undetermined Anteroseptal  infarct , age undetermined Abnormal ECG Confirmed by Bebe Shaggy  MD, DONALD  [phone]) on 09/27/2013 11:46:08 AM      MDM   Final diagnoses:  Atrial fibrillation  CHF (congestive heart failure)   Patient with shortness of breath. He is morbidly obese. Also has 2+ bilateral lower extremity edema. No chest pain. Will check his labs. Will reevaluate.   Patient seen by and discussed with Dr. Bebe Shaggy.  Will admit to cardiology.   Roxy Horseman, PA-C 09/27/13 1438

## 2013-09-27 NOTE — H&P (Signed)
Maurice Nguyen. is an 48 y.o. male.    Primary Cardiologist: [facility] PCP:  Elizabeth Palau, MD  Chief Complaint: SOB that began last night and today, swelling of lower ext X 1-2 weeks  HPI: 48 year old AAM with Hx PAF in 06/2012 with perforated appendix.  He was treated conservatively and though surgery was planned his pain resolved and he did not follow up.  He had converted to SR on OV in Feb. 2014, but the plan had been to begin coumadin, after his appendectomy but never had surgery and never followed back up.  Echo with EF 55-60% L atrium was mildly to mod. Dilated in 2013.   He may have had occ episode of fast HR over the last year but did not last long.  Over last 1-2 weeks has had increased swelling of lower ext. Edema.  Usually swelling resolves overnight, but not last couple of weeks.   Last night and today he became more significantly SOB and presented to ER.  No chest pain.  He stated he had run out of his lasix some time ago.  He does not like to take meds.   He sleeps in a recliner due to back pain, but cannot sleep over 1-2 hours without waking up.  He does snore.  Has trouble staying awake during the day.   Here in the ER was found to be in  Atrial fib with RVR and hypertensive.  He was not aware of the atrial fib.    Past Medical History  Diagnosis Date  . Diabetes mellitus   . Morbid obesity   . Lumbar herniated disc   . 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 Obesity, morbid, BMI 50 or higher 07/13/2012  . Hypertension 07/13/2012  . Diabetes mellitus 07/13/2012  . Back pain, chronic 07/13/2012    Past Surgical History  Procedure Laterality Date  . Abscess drainage    . Dental surgery    . Colonoscopy  08/22/2012    Procedure: COLONOSCOPY;  Surgeon: Lear Ng, MD;  Location: [facility];  Service: Endoscopy;  Laterality: N/A;    History reviewed. No pertinent family history.  Pt did nto wish to give family history.  Social  History:  reports that he has never smoked. He has never used smokeless tobacco. He reports that he does not drink alcohol or use illicit drugs.  Allergies: No Known Allergies  OutPatient Medications:  Metformin 1000 mg Daily Glipizide 5 mg daily He is on no other meds, though he may take pain pill occ.  Results for orders placed during the hospital encounter of 09/27/13 (from the past 48 hour(s))  CBC WITH DIFFERENTIAL     Status: Abnormal   Collection Time    09/27/13 12:25 PM      Result Value Ref Range   WBC 8.1  4.0 - 10.5 K/uL   RBC 4.12 (*) 4.22 - 5.81 MIL/uL   Hemoglobin 13.0  13.0 - 17.0 g/dL   HCT 38.5 (*) 39.0 - 52.0 %   MCV 93.4  78.0 - 100.0 fL   MCH 31.6  26.0 - 34.0 pg   MCHC 33.8  30.0 - 36.0 g/dL   RDW 13.6  11.5 - 15.5 %   Platelets 245  150 - 400 K/uL   Neutrophils Relative % 65  43 - 77 %   Neutro Abs 5.3  1.7 - 7.7 K/uL   Lymphocytes Relative 24  12 - 46 %  Lymphs Abs 2.0  0.7 - 4.0 K/uL   Monocytes Relative 9  3 - 12 %   Monocytes Absolute 0.7  0.1 - 1.0 K/uL   Eosinophils Relative 2  0 - 5 %   Eosinophils Absolute 0.1  0.0 - 0.7 K/uL   Basophils Relative 0  0 - 1 %   Basophils Absolute 0.0  0.0 - 0.1 K/uL  BASIC METABOLIC PANEL     Status: Abnormal   Collection Time    09/27/13 12:25 PM      Result Value Ref Range   Sodium 140  137 - 147 mEq/L   Potassium 5.1  3.7 - 5.3 mEq/L   Comment: HEMOLYSIS AT THIS LEVEL MAY AFFECT RESULT   Chloride 103  96 - 112 mEq/L   CO2 25  19 - 32 mEq/L   Glucose, Bld 116 (*) 70 - 99 mg/dL   BUN 12  6 - 23 mg/dL   Creatinine, Ser 0.95  0.50 - 1.35 mg/dL   Calcium 8.7  8.4 - 10.5 mg/dL   GFR calc non Af Amer >90  >90 mL/min   GFR calc Af Amer >90  >90 mL/min   Comment: (NOTE)     The eGFR has been calculated using the CKD EPI equation.     This calculation has not been validated in all clinical situations.     eGFR's persistently <90 mL/min signify possible Chronic Kidney     Disease.  PRO B NATRIURETIC PEPTIDE      Status: Abnormal   Collection Time    09/27/13 12:25 PM      Result Value Ref Range   Pro B Natriuretic peptide (BNP) 1185.0 (*) 0 - 125 pg/mL   Comment: HEMOLYSIS AT THIS LEVEL MAY AFFECT RESULT  I-STAT [place], ED     Status: None   Collection Time    09/27/13 12:37 PM      Result Value Ref Range   Troponin i, poc 0.00  0.00 - 0.08 ng/mL   Comment 3            Comment: Due to the release kinetics of cTnI,     a negative result within the first hours     of the onset of symptoms does not rule out     myocardial infarction with certainty.     If myocardial infarction is still suspected,     repeat the test at appropriate intervals.   Dg Chest Portable 1 View  09/27/2013   CLINICAL DATA:  Shortness of Breath  EXAM: PORTABLE CHEST - 1 VIEW  COMPARISON:  August 22, 2012  FINDINGS: Lungs are clear. There is moderate cardiomegaly with normal pulmonary vascularity. No adenopathy. No bone lesions.  IMPRESSION: Generalized cardiomegaly, a finding also present previously. No apparent edema or consolidation.   Electronically Signed   By: Lowella Grip M.D.   On: 09/27/2013 12:17    ROS: General:no colds or fevers, no weight changes, + snoring wakes at freq intervals during the night. Skin:no rashes or ulcers HEENT:no blurred vision, no congestion CV:see HPI PUL:see HPI GI:no diarrhea constipation or melena, no indigestion GU:no hematuria, no dysuria MS:no joint pain, no claudication, + lower ext edema Neuro:no syncope, no lightheadedness Endo:+ diabetes, no thyroid disease   Blood pressure 134/90, pulse 113, temperature 99 F (37.2 C), temperature source Oral, resp. rate 24, SpO2 98.00%. PE: General:Pleasant affect, NAD, obese Skin:Warm and dry, brisk capillary refill HEENT:normocephalic, sclera clear, mucus membranes moist Neck:Thick, supple, no  JVD, no bruits  Heart:S1S2 RRR without murmur, gallup, rub or click Lungs:clear without rales, rhonchi, or wheezes NFA:OZHYQ, soft,  non tender, + BS, do not palpate liver spleen or masses Ext:2+ lower ext edema to his knees, 2+ pedal pulses, 2+ radial pulses Neuro:alert and oriented, MAE, follows commands, + facial symmetry    Assessment/Plan Principal Problem:   Atrial fibrillation with RVR Active Problems:   DIABETES MELLITUS, TYPE II, UNCONTROLLED   HYPERTENSION   Obesity, morbid, BMI 50 or higher   Back pain, chronic   CHF (congestive heart failure), will eval for systolic HF  PLAN: Pt with atrial fib RVR, first prolonged episode since last year, though he was not aware of the arrythmia.  + edema.  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  Add Xarelto and Cardizem IV to control rate.  Check Echo, ddimer, cardiac enzymes and will need sleep study as outpt. Which is being arranged through Dr. Theodosia Blender nurse.   Has rec'd one dose IV lasix will continue.  EKG A fib with RVR but no acute changes.  [place] Nurse Practitioner Certified [place] Pager [phone] or after 5pm or weekends call [phone] 09/27/2013, 2:09 PM

## 2013-09-28 ENCOUNTER — Other Ambulatory Visit: Payer: Medicare Other | Admitting: General Surgery

## 2013-09-28 DIAGNOSIS — I059 Rheumatic mitral valve disease, unspecified: Secondary | ICD-10-CM

## 2013-09-28 DIAGNOSIS — G4730 Sleep apnea, unspecified: Secondary | ICD-10-CM

## 2013-09-28 DIAGNOSIS — R0683 Snoring: Secondary | ICD-10-CM

## 2013-09-28 LAB — GLUCOSE, CAPILLARY
Glucose-Capillary: 135 mg/dL — ABNORMAL HIGH (ref 70–99)
Glucose-Capillary: 160 mg/dL — ABNORMAL HIGH (ref 70–99)
Glucose-Capillary: 124 mg/dL — ABNORMAL HIGH (ref 70–99)
Glucose-Capillary: 130 mg/dL — ABNORMAL HIGH (ref 70–99)

## 2013-09-28 LAB — BASIC METABOLIC PANEL
BUN: 15 mg/dL (ref 6–23)
CO2: 26 mEq/L (ref 19–32)
Calcium: 8.5 mg/dL (ref 8.4–10.5)
Chloride: 100 mEq/L (ref 96–112)
Creatinine, Ser: 1.12 mg/dL (ref 0.50–1.35)
GFR calc Af Amer: 87 mL/min — ABNORMAL LOW (ref >90)
GFR calc non Af Amer: 75 mL/min — ABNORMAL LOW (ref >90)
Glucose, Bld: 151 mg/dL — ABNORMAL HIGH (ref 70–99)
Potassium: 3.6 mEq/L — ABNORMAL LOW (ref 3.7–5.3)
Sodium: 138 mEq/L (ref 137–147)

## 2013-09-28 LAB — HEPATIC FUNCTION PANEL
ALT: 30 U/L (ref 0–53)
AST: 16 U/L (ref 0–37)
Albumin: 3.4 g/dL — ABNORMAL LOW (ref 3.5–5.2)
Alkaline Phosphatase: 56 U/L (ref 39–117)
Bilirubin, Direct: <0.2 mg/dL (ref 0.0–0.3)
Total Bilirubin: 0.7 mg/dL (ref 0.3–1.2)
Total Protein: 6.7 g/dL (ref 6.0–8.3)

## 2013-09-28 LAB — HEMOGLOBIN A1C
Hgb A1c MFr Bld: 7.1 % — ABNORMAL HIGH (ref <5.7)
Mean Plasma Glucose: 157 mg/dL — ABNORMAL HIGH (ref <117)

## 2013-09-28 LAB — TROPONIN I: Troponin I: <0.3 ng/mL (ref <0.30)

## 2013-09-28 LAB — TSH: TSH: 2.421 u[IU]/mL (ref 0.350–4.500)

## 2013-09-28 MED ORDER — PERFLUTREN LIPID MICROSPHERE
1.0000 mL | INTRAVENOUS | Status: AC | PRN
  Administered 2013-09-28: 2 mL via INTRAVENOUS
  Filled 2013-09-28: qty 10

## 2013-09-28 MED ORDER — HYDROCODONE-ACETAMINOPHEN 5-325 MG PO TABS
1.0000 | ORAL_TABLET | Freq: Four times a day (QID) | ORAL | Status: DC | PRN
  Administered 2013-09-28: 1 via ORAL
  Administered 2013-09-28: 2 via ORAL
  Administered 2013-09-28: 1 via ORAL
  Administered 2013-09-29 – 2013-10-07 (×19): 2 via ORAL
  Filled 2013-09-28 (×4): qty 2
  Filled 2013-09-28: qty 1
  Filled 2013-09-28 (×10): qty 2
  Filled 2013-09-28: qty 1
  Filled 2013-09-28 (×7): qty 2

## 2013-09-28 MED ORDER — CARVEDILOL 6.25 MG PO TABS
6.2500 mg | ORAL_TABLET | Freq: Two times a day (BID) | ORAL | Status: DC
  Administered 2013-09-28 – 2013-10-01 (×6): 6.25 mg via ORAL
  Filled 2013-09-28 (×8): qty 1

## 2013-09-28 MED ORDER — ZOLPIDEM TARTRATE 5 MG PO TABS
5.0000 mg | ORAL_TABLET | Freq: Every evening | ORAL | Status: DC | PRN
  Administered 2013-09-28 – 2013-10-01 (×2): 5 mg via ORAL
  Filled 2013-09-28 (×2): qty 1

## 2013-09-28 NOTE — ED Provider Notes (Signed)
Medical screening examination/treatment/procedure(s) were conducted as a shared visit with non-physician practitioner(s) and myself.  I personally evaluated the patient during the encounter.   EKG Interpretation   Date/Time:  Thursday September 27 2013 11:19:03 EST Ventricular Rate:  105 PR Interval:    QRS Duration: 80 QT Interval:  296 QTC Calculation: 391 R Axis:   -43 Text Interpretation:  Atrial fibrillation with rapid ventricular response  Left axis deviation Inferior infarct , age undetermined Anteroseptal  infarct , age undetermined Abnormal ECG Confirmed by Bebe Shaggy  MD, Keir Viernes  [phone]) on 09/27/2013 11:46:08 AM        Joya Gaskins, MD 09/28/13 [phone]

## 2013-09-28 NOTE — Telephone Encounter (Signed)
Snoring and witnessed apnea and morbid obesity

## 2013-09-28 NOTE — Progress Notes (Signed)
Utilization Review Completed.  Pt meets for arrhythmia.   Marquesha Robideau J. Lucretia Roers, RN, BSN, [employer] [phone].

## 2013-09-28 NOTE — Telephone Encounter (Signed)
Order put in system and paper work filled out for pt.

## 2013-09-28 NOTE — Care Management Note (Addendum)
Page 2 of 2   10/03/2013     4:00:30 PM   CARE MANAGEMENT NOTE 10/03/2013  Patient:  Maurice Nguyen, Maurice Nguyen   Account Number:  [id number]  Date Initiated:  09/28/2013  Documentation initiated by:  [facility]  Subjective/Objective Assessment:   Principal Problem: Atrial fibrillation with RVRActive Problems:DIABETES MELLITUS, TYPE II; UNCONTROLLED    HTN, Obesity, morbid, BMI 50 or higher    Back pain, CHF/ Home alone     Action/Plan:   Cardizem IV//Offer HH   Anticipated DC Date:  10/02/2013   Anticipated DC Plan:  HOME W HOME HEALTH SERVICES      DC Planning Services  CM consult  GCCN / P4HM (established/new)      Choice offered to / List presented to:             Status of service:  In process, will continue to follow Medicare Important Message given?   (If response is "NO", the following Medicare IM given date fields will be blank) Date Medicare IM given:   Date Additional Medicare IM given:    Discharge Disposition:    Per UR Regulation:  Reviewed for med. necessity/level of care/duration of stay  If discussed at Long Length of Stay Meetings, dates discussed:   10/02/2013  10/04/2013    Comments:  10/03/2013 event of rapid A-Flutter and started on Cardizem gtt; continues with IV Lasix tid. Disposition Pending medical stability.  (See note below for dispositon plans) Maurice Clinkscales RN, BSN, MSHL, CCM 10/03/2013  10/02/2013 Social:  From home with wife/Maurice Nguyen. Patient interested in [employer] program, recliner for home, scales for over 450 lbs. CM completed research as follows:  Silver Sneakers: Patient has MCR/MCD and is inelligible for [employer] / [employer] option. CM contacted [employer] and printed on line application (Open Doors Application).  CM instructed to complete and take to [facility] for submission for eligibilty for program financial assistance.  Printed copy with information provided to patient.  Recliner MD order needed if portion of motor filed  with provider. Provider will only cover a portion of the motor Must come into [facility] to pick a chair out and store will order. Must private pay for the chair Cost will be between $700-800 dollars estimated.  Cost is determined by the chair chosen CM provided VE on importance of not becoming dependent on chair and getting up and down out of the chair is meeting daily therapy goals.  CM discussed patients interest in Silver Sneakers and defeats therapy goals.  CM encouraged to stay active as much as possible to meed activity goals. Printed copy with information provided to patient.  Scales: [email or web address] [id number] [facility] [address] Projection Scale - $44.99 CM advised to order either on line or go to [employer] and order. Printed copy with information provided to patient.  Disposition Plan: HHS: RN: Disease MGMT, NEW CHF - Telehealth (will only go up to 450 lbs) - [facility] notified) DME: Bariatric Bed  (to be ordered with [facility] closer to d/c) P4CC - Active [facility]N - inelligible d/t PCP: Dr. Bruna Potter out of network and not willing to change PCPs Cardiology f/u: ___________pending ADD:  3 (Remains on IV Lasix  40mg  q 8 hours  Wt 449/460. Maurice Shimko RN, BSN, MSHL, CCM 10/02/2013 4:13pm  10/01/2013 IV Lasix PT Recs:  Pending Patient requesting Hospital Bed, Recliner and scale at d/c [facility] 452) Silver Sneakers/[employer] P4CC - Active [facility]N - inelligible d/t PCP: Dr. Bruna Potter out of network and not willing to change PCPs Dispositon  Plan: HHS:  RN (New CHF) Maurice Hindes RN, BSN, MSHL, CCM 10/01/2013  09/28/13 1630 Maurice Lucretia RoersWood, RN, BSN, [employer] [phone] Spoke with pt at bedside concerning discharge planning.  Pt inquiring about scales, hospital bed and/or recliiner for home, and silver sneaker [employer] membership.  With pt having Meicare and Medicaid, pt will not qualify [facility] scale does not handle his weight).  NCM will work on hospital bed for home and  reach out to [facility] and [facility] for further community services.

## 2013-09-28 NOTE — Telephone Encounter (Signed)
OK to use AFIB as Diagnosis?

## 2013-09-28 NOTE — Progress Notes (Signed)
Subjective:  Breathing better but still with sob  Objective:   Vital Signs in the last 24 hours: Temp:  [98.4 F (36.9 C)-98.9 F (37.2 C)] 98.4 F (36.9 C) (03/06 1011) Pulse Rate:  [41-113] 86 (03/06 1011) Resp:  [18-27] 20 (03/06 1011) BP: (113-141)/(58-97) 113/83 mmHg (03/06 1011) SpO2:  [91 %-100 %] 95 % (03/06 1011) Weight:  [400 lb (181.439 kg)-460 lb 1.6 oz (208.7 kg)] 459 lb 10.6 oz (208.5 kg) (03/06 0518)  Intake/Output from previous day: 03/05 0701 - 03/06 0700 In: 241.2 [P.O.:240; I.V.:1.2] Out: 800 [Urine:800]   Medications: . aspirin EC  81 mg Oral Daily  . carvedilol  3.125 mg Oral BID WC  . furosemide  40 mg Intravenous Q12H  . glipiZIDE  10 mg Oral BID AC  . insulin aspart  0-15 Units Subcutaneous TID WC  . insulin aspart  0-5 Units Subcutaneous QHS  . potassium chloride  20 mEq Oral BID  . ramipril  2.5 mg Oral BID  . rivaroxaban  20 mg Oral Q supper  . sodium chloride  3 mL Intravenous Q12H    . diltiazem (CARDIZEM) infusion 10 mg/hr (09/28/13 16100627)    Physical Exam:   General appearance: alert, cooperative and morbidly obese Neck: no adenopathy, no JVD, supple, symmetrical, trachea midline, thyroid not enlarged, symmetric, no tenderness/mass/nodules and very thick neck Lungs: clear to auscultation bilaterally Heart: irregularly irregular rhythm and 1/6 sem Abdomen: marked central adiposity, bs+ Extremities: 1+ edema Neurologic: Grossly normal   Rate: 86  Rhythm: atrial fibrillation  Lab Results:    Recent Labs  09/27/13 1225 09/28/13 0006  NA 140 138  K 5.1 3.6*  CL 103 100  CO2 25 26  GLUCOSE 116* 151*  BUN 12 15  CREATININE 0.95 1.12    Recent Labs  09/27/13 1835 09/28/13 0006  TROPONINI <0.30 <0.30   Hepatic Function Panel  Recent Labs  09/28/13 0006  PROT 6.7  ALBUMIN 3.4*  AST 16  ALT 30  ALKPHOS 56  BILITOT 0.7  BILIDIR <0.2  IBILI NOT CALCULATED    Recent Labs  09/27/13 1835  INR 1.04   BNP  (last 3 results)  Recent Labs  09/27/13 1225  PROBNP 1185.0*    Lipid Panel     Component Value Date/Time   CHOL  Value: 159        ATP III CLASSIFICATION:  <200     mg/dL   Desirable  960-454200-239  mg/dL   Borderline High  >=098>=240    mg/dL   High        1/19/14787/20/2011 0730   TRIG 58 02/11/2010 0730   HDL 59 02/11/2010 0730   CHOLHDL 2.7 02/11/2010 0730   VLDL 12 02/11/2010 0730   LDLCALC  Value: 88        Total Cholesterol/HDL:CHD Risk Coronary Heart Disease Risk Table                     Men   Women  1/2 Average Risk   3.4   3.3  Average Risk       5.0   4.4  2 X Average Risk   9.6   7.1  3 X Average Risk  23.4   11.0        Use the calculated Patient Ratio above and the CHD Risk Table to determine the patient's CHD Risk.        ATP III CLASSIFICATION (LDL):  <100  mg/dL   Optimal  060-045  mg/dL   Near or Above                    Optimal  130-159  mg/dL   Borderline  997-741  mg/dL   High  >423     mg/dL   Very High 9/53/2023 3435      Imaging:  Dg Chest Portable 1 View  09/27/2013   CLINICAL DATA:  Shortness of Breath  EXAM: PORTABLE CHEST - 1 VIEW  COMPARISON:  August 22, 2012  FINDINGS: Lungs are clear. There is moderate cardiomegaly with normal pulmonary vascularity. No adenopathy. No bone lesions.  IMPRESSION: Generalized cardiomegaly, a finding also present previously. No apparent edema or consolidation.   Electronically Signed   By: Bretta Bang M.D.   On: 09/27/2013 12:17      Assessment/Plan:   Principal Problem:   Acute CHF (congestive heart failure) Active Problems:   DIABETES MELLITUS, TYPE II, UNCONTROLLED   HYPERTENSION   Obesity, morbid, BMI 50 or higher   Back pain, chronic   Atrial fibrillation with RVR   CHF (congestive heart failure), will eval for systolic HF   Hypertensive urgency   48 yo AAM with super morbid obesity and very high liklihood of severe OSA which may be contributing to his AF. Now on xarelto for anticoagulation. Echo done, results pending. On  IV cardizem; increase coreg to 6.25 mg and will need titration upward. Await assessment of EF. Will need sleep study.   Lennette Bihari, MD, [facility] 09/28/2013, 1:08 PM

## 2013-09-28 NOTE — Progress Notes (Signed)
Echocardiogram 2D Echocardiogram with Definity  has been performed.  Maurice Nguyen 09/28/2013, 12:01 PM

## 2013-09-28 NOTE — Discharge Instructions (Signed)

## 2013-09-28 NOTE — Telephone Encounter (Signed)
Ordered and paper given to Bonita Quin to fax over to [facility]

## 2013-09-29 LAB — GLUCOSE, CAPILLARY
Glucose-Capillary: 173 mg/dL — ABNORMAL HIGH (ref 70–99)
Glucose-Capillary: 181 mg/dL — ABNORMAL HIGH (ref 70–99)
Glucose-Capillary: 165 mg/dL — ABNORMAL HIGH (ref 70–99)
Glucose-Capillary: 151 mg/dL — ABNORMAL HIGH (ref 70–99)

## 2013-09-29 LAB — BASIC METABOLIC PANEL
BUN: 18 mg/dL (ref 6–23)
BUN: 18 mg/dL (ref 6–23)
CO2: 26 mEq/L (ref 19–32)
CO2: 26 mEq/L (ref 19–32)
Calcium: 8.8 mg/dL (ref 8.4–10.5)
Calcium: 8.8 mg/dL (ref 8.4–10.5)
Chloride: 102 mEq/L (ref 96–112)
Chloride: 99 mEq/L (ref 96–112)
Creatinine, Ser: 1.03 mg/dL (ref 0.50–1.35)
Creatinine, Ser: 1.06 mg/dL (ref 0.50–1.35)
GFR calc Af Amer: >90 mL/min (ref >90)
GFR calc Af Amer: >90 mL/min (ref >90)
GFR calc non Af Amer: 84 mL/min — ABNORMAL LOW (ref >90)
GFR calc non Af Amer: 81 mL/min — ABNORMAL LOW (ref >90)
Glucose, Bld: 194 mg/dL — ABNORMAL HIGH (ref 70–99)
Glucose, Bld: 183 mg/dL — ABNORMAL HIGH (ref 70–99)
Potassium: 4.1 mEq/L (ref 3.7–5.3)
Potassium: 3.9 mEq/L (ref 3.7–5.3)
Sodium: 141 mEq/L (ref 137–147)
Sodium: 138 mEq/L (ref 137–147)

## 2013-09-29 MED ORDER — FUROSEMIDE 10 MG/ML IJ SOLN
40.0000 mg | Freq: Three times a day (TID) | INTRAMUSCULAR | Status: DC
  Administered 2013-09-29 – 2013-10-06 (×21): 40 mg via INTRAVENOUS
  Filled 2013-09-29 (×23): qty 4

## 2013-09-29 MED ORDER — DILTIAZEM HCL 30 MG PO TABS
30.0000 mg | ORAL_TABLET | Freq: Three times a day (TID) | ORAL | Status: DC
  Administered 2013-09-29 – 2013-10-01 (×7): 30 mg via ORAL
  Filled 2013-09-29 (×10): qty 1

## 2013-09-29 NOTE — Progress Notes (Signed)
Patient ID: [name], male   DOB: 10/21/1965, 48 y.o.   MRN: [id number]   Subjective:  Legs are still swollen and painful   Objective:   Vital Signs in the last 24 hours: Temp:  [98.1 F (36.7 C)-99.1 F (37.3 C)] 98.1 F (36.7 C) (03/07 0608) Pulse Rate:  [64-86] 64 (03/07 0608) Resp:  [18-20] 18 (03/07 40980608) BP: (113-139)/(72-83) 122/73 mmHg (03/07 0608) SpO2:  [95 %-99 %] 95 % (03/07 0608) Weight:  [459 lb 14.4 oz (208.609 kg)] 459 lb 14.4 oz (208.609 kg) (03/07 11910608)  Intake/Output from previous day: 03/06 0701 - 03/07 0700 In: 1080 [P.O.:960; I.V.:120] Out: 1900 [Urine:1900]   Medications: . aspirin EC  81 mg Oral Daily  . carvedilol  6.25 mg Oral BID WC  . furosemide  40 mg Intravenous Q12H  . glipiZIDE  10 mg Oral BID AC  . insulin aspart  0-15 Units Subcutaneous TID WC  . insulin aspart  0-5 Units Subcutaneous QHS  . potassium chloride  20 mEq Oral BID  . ramipril  2.5 mg Oral BID  . rivaroxaban  20 mg Oral Q supper  . sodium chloride  3 mL Intravenous Q12H    . diltiazem (CARDIZEM) infusion 10 mg/hr (09/29/13 0153)    Physical Exam:   Affect appropriate Morbidly obese black male  HEENT: normal Neck supple with no adenopathy JVP normal no bruits no thyromegaly Lungs clear with no wheezing and good diaphragmatic motion Heart:  S1/S2 no murmur, no rub, gallop or click PMI normal Abdomen: benighn, BS positve, no tenderness, no AAA no bruit.  No HSM or HJR Distal pulses intact with no bruits Plus 2-3 bilateral edema Neuro non-focal Skin warm and dry No muscular weakness    Rate: 86  Rhythm: atrial fibrillation  Lab Results:     Recent Labs  09/28/13 0006 09/29/13 0545  NA 138 141  K 3.6* 4.1  CL 100 102  CO2 26 26  GLUCOSE 151* 194*  BUN 15 18  CREATININE 1.12 1.03    Recent Labs  09/27/13 1835 09/28/13 0006  TROPONINI <0.30 <0.30   Hepatic Function Panel  Recent Labs  09/28/13 0006  PROT 6.7  ALBUMIN 3.4*  AST  16  ALT 30  ALKPHOS 56  BILITOT 0.7  BILIDIR <0.2  IBILI NOT CALCULATED    Recent Labs  09/27/13 1835  INR 1.04   BNP (last 3 results)  Recent Labs  09/27/13 1225  PROBNP 1185.0*    Lipid Panel     Component Value Date/Time   CHOL  Value: 159        ATP III CLASSIFICATION:  <200     mg/dL   Desirable  478-295200-239  mg/dL   Borderline High  >=621>=240    mg/dL   High        3/08/65787/20/2011 0730   TRIG 58 02/11/2010 0730   HDL 59 02/11/2010 0730   CHOLHDL 2.7 02/11/2010 0730   VLDL 12 02/11/2010 0730   LDLCALC  Value: 88        Total Cholesterol/HDL:CHD Risk Coronary Heart Disease Risk Table                     Men   Women  1/2 Average Risk   3.4   3.3  Average Risk       5.0   4.4  2 X Average Risk   9.6   7.1  3 X Average Risk  23.4   11.0        Use the calculated Patient Ratio above and the CHD Risk Table to determine the patient's CHD Risk.        ATP III CLASSIFICATION (LDL):  <100     mg/dL   Optimal  448-185  mg/dL   Near or Above                    Optimal  130-159  mg/dL   Borderline  631-497  mg/dL   High  >026     mg/dL   Very High 3/78/5885 0277      Imaging:  Dg Chest Portable 1 View  09/27/2013   CLINICAL DATA:  Shortness of Breath  EXAM: PORTABLE CHEST - 1 VIEW  COMPARISON:  August 22, 2012  FINDINGS: Lungs are clear. There is moderate cardiomegaly with normal pulmonary vascularity. No adenopathy. No bone lesions.  IMPRESSION: Generalized cardiomegaly, a finding also present previously. No apparent edema or consolidation.   Electronically Signed   By: Bretta Bang M.D.   On: 09/27/2013 12:17   Echo: Study Conclusions  - Procedure narrative: Transthoracic echocardiography. The study was technically difficult, as a result of poor acoustic windows, poor sound wave transmission, and body habitus. Intravenous contrast (Definity) was administered. - Left ventricle: The cavity size was normal. There was moderate concentric hypertrophy. Systolic function was mildly reduced. The  estimated ejection fraction was in the range of 45% to 50%. Definity contrast was administered which did not aid in wall motion or EF determination. The study is not technically sufficient to allow evaluation of LV diastolic function. - Mitral valve: Poorly visualized. Mild regurgitation. - Left atrium: The atrium was mildly dilated. - Inferior vena cava: The vessel was dilated; the respirophasic diameter changes were blunted (< 50%); findings are consistent with elevated central venous pressure. - Pericardium, extracardiac: There was no pericardial effusion.     Assessment/Plan:   Principal Problem:   Acute CHF (congestive heart failure) Active Problems:   DIABETES MELLITUS, TYPE II, UNCONTROLLED   HYPERTENSION   Obesity, morbid, BMI 50 or higher   Back pain, chronic   Atrial fibrillation with RVR   CHF (congestive heart failure), will eval for systolic HF   Hypertensive urgency   48 yo AAM with super morbid obesity and very high liklihood of severe OSA which may be contributing to his AF. Now on xarelto for anticoagulation.  EF 45-50% Unable to assess Diastolic parameters due to afib and obesity .  Change to q 8 lasix.  Good output so don't think drip needed.  Change to PO cardizem  Rate control of afib is good.  Continue xarelto For anticoagulation

## 2013-09-30 LAB — BASIC METABOLIC PANEL
BUN: 18 mg/dL (ref 6–23)
CO2: 27 mEq/L (ref 19–32)
Calcium: 8.9 mg/dL (ref 8.4–10.5)
Chloride: 101 mEq/L (ref 96–112)
Creatinine, Ser: 1.07 mg/dL (ref 0.50–1.35)
GFR calc Af Amer: >90 mL/min (ref >90)
GFR calc non Af Amer: 80 mL/min — ABNORMAL LOW (ref >90)
Glucose, Bld: 214 mg/dL — ABNORMAL HIGH (ref 70–99)
Potassium: 3.9 mEq/L (ref 3.7–5.3)
Sodium: 141 mEq/L (ref 137–147)

## 2013-09-30 LAB — GLUCOSE, CAPILLARY
Glucose-Capillary: 173 mg/dL — ABNORMAL HIGH (ref 70–99)
Glucose-Capillary: 183 mg/dL — ABNORMAL HIGH (ref 70–99)
Glucose-Capillary: 140 mg/dL — ABNORMAL HIGH (ref 70–99)
Glucose-Capillary: 220 mg/dL — ABNORMAL HIGH (ref 70–99)

## 2013-09-30 NOTE — Progress Notes (Signed)
Patient alert and oriented x4, family and girlfriend at bedside throughout shift.  Patient denies any shortness of breath or pain at this time, but did receive PRN earlier in shift for bilateral foot pain "from neuropathy".  HF zone tool discussed with patient using teach back method.  Patient voiced willingness to watch HF video "after the race is over".  Will continue to monitor and will encourage patient to watch HF video.

## 2013-09-30 NOTE — Progress Notes (Signed)
Patient ID: Maurice Nguyen., male   DOB: [birth year], 48 y.o.   MRN: [id number]   Subjective:  Legs are still swollen and painful   Objective:   Vital Signs in the last 24 hours: Temp:  [97.9 F (36.6 C)-98.8 F (37.1 C)] 97.9 F (36.6 C) (03/08 0636) Pulse Rate:  [63-83] 83 (03/08 0636) Resp:  [18-20] 18 (03/08 0636) BP: (119-151)/(65-79) 119/65 mmHg (03/08 0636) SpO2:  [96 %-100 %] 98 % (03/08 0636) Weight:  [454 lb 14.4 oz (206.341 kg)] 454 lb 14.4 oz (206.341 kg) (03/08 0636)  Intake/Output from previous day: 03/07 0701 - 03/08 0700 In: 1800 [P.O.:1800] Out: 3525 [Urine:3525]   Medications: . aspirin EC  81 mg Oral Daily  . carvedilol  6.25 mg Oral BID WC  . diltiazem  30 mg Oral 3 times per day  . furosemide  40 mg Intravenous 3 times per day  . glipiZIDE  10 mg Oral BID AC  . insulin aspart  0-15 Units Subcutaneous TID WC  . insulin aspart  0-5 Units Subcutaneous QHS  . potassium chloride  20 mEq Oral BID  . ramipril  2.5 mg Oral BID  . rivaroxaban  20 mg Oral Q supper  . sodium chloride  3 mL Intravenous Q12H       Physical Exam:   Affect appropriate Morbidly obese black male  HEENT: normal Neck supple with no adenopathy JVP normal no bruits no thyromegaly Lungs clear with no wheezing and good diaphragmatic motion Heart:  S1/S2 no murmur, no rub, gallop or click PMI normal Abdomen: benighn, BS positve, no tenderness, no AAA no bruit.  No HSM or HJR Distal pulses intact with no bruits Plus 2-3 bilateral edema Neuro non-focal Skin warm and dry No muscular weakness    Rate: 86  Rhythm: atrial fibrillation  Lab Results:     Recent Labs  09/29/13 0900 09/30/13 0533  NA 138 141  K 3.9 3.9  CL 99 101  CO2 26 27  GLUCOSE 183* 214*  BUN 18 18  CREATININE 1.06 1.07    Recent Labs  09/27/13 1835 09/28/13 0006  TROPONINI <0.30 <0.30   Hepatic Function Panel  Recent Labs  09/28/13 0006  PROT 6.7  ALBUMIN 3.4*  AST 16  ALT 30   ALKPHOS 56  BILITOT 0.7  BILIDIR <0.2  IBILI NOT CALCULATED    Recent Labs  09/27/13 1835  INR 1.04   BNP (last 3 results)  Recent Labs  09/27/13 1225  PROBNP 1185.0*    Lipid Panel     Component Value Date/Time   CHOL  Value: 159        ATP III CLASSIFICATION:  <200     mg/dL   Desirable  226-333  mg/dL   Borderline High  >=545    mg/dL   High        01/18/6388 0730   TRIG 58 02/11/2010 0730   HDL 59 02/11/2010 0730   CHOLHDL 2.7 02/11/2010 0730   VLDL 12 02/11/2010 0730   LDLCALC  Value: 88        Total Cholesterol/HDL:CHD Risk Coronary Heart Disease Risk Table                     Men   Women  1/2 Average Risk   3.4   3.3  Average Risk       5.0   4.4  2 X Average Risk   9.6   7.1  3 X Average Risk  23.4   11.0        Use the calculated Patient Ratio above and the CHD Risk Table to determine the patient's CHD Risk.        ATP III CLASSIFICATION (LDL):  <100     mg/dL   Optimal  161-096100-129  mg/dL   Near or Above                    Optimal  130-159  mg/dL   Borderline  045-409160-189  mg/dL   High  >811>190     mg/dL   Very High 9/14/78297/20/2011 [id number]      Imaging:  No results found. Echo: Study Conclusions  - Procedure narrative: Transthoracic echocardiography. The study was technically difficult, as a result of poor acoustic windows, poor sound wave transmission, and body habitus. Intravenous contrast (Definity) was administered. - Left ventricle: The cavity size was normal. There was moderate concentric hypertrophy. Systolic function was mildly reduced. The estimated ejection fraction was in the range of 45% to 50%. Definity contrast was administered which did not aid in wall motion or EF determination. The study is not technically sufficient to allow evaluation of LV diastolic function. - Mitral valve: Poorly visualized. Mild regurgitation. - Left atrium: The atrium was mildly dilated. - Inferior vena cava: The vessel was dilated; the respirophasic diameter changes were blunted (<  50%); findings are consistent with elevated central venous pressure. - Pericardium, extracardiac: There was no pericardial effusion.     Assessment/Plan:   Principal Problem:   Acute CHF (congestive heart failure) Active Problems:   DIABETES MELLITUS, TYPE II, UNCONTROLLED   HYPERTENSION   Obesity, morbid, BMI 50 or higher   Back pain, chronic   Atrial fibrillation with RVR   CHF (congestive heart failure), will eval for systolic HF   Hypertensive urgency   48 yo AAM with super morbid obesity and very high liklihood of severe OSA which may be contributing to his AF. Now on xarelto for anticoagulation.  EF 45-50% Unable to assess Diastolic parameters due to afib and obesity .  Change to q 8 lasix.  Good output so don't think drip needed.  Change to PO cardizem  Rate control of afib is good.  Continue xarelto For anticoagulation  Patient willing to wear CPAP  Slow improvement May be ready for d/c Wednesday   Charlton HawsPeter Novak Stgermaine

## 2013-10-01 LAB — GLUCOSE, CAPILLARY
Glucose-Capillary: 141 mg/dL — ABNORMAL HIGH (ref 70–99)
Glucose-Capillary: 207 mg/dL — ABNORMAL HIGH (ref 70–99)
Glucose-Capillary: 146 mg/dL — ABNORMAL HIGH (ref 70–99)
Glucose-Capillary: 133 mg/dL — ABNORMAL HIGH (ref 70–99)

## 2013-10-01 LAB — MAGNESIUM: Magnesium: 2.1 mg/dL (ref 1.5–2.5)

## 2013-10-01 LAB — BASIC METABOLIC PANEL
BUN: 17 mg/dL (ref 6–23)
CO2: 29 mEq/L (ref 19–32)
Calcium: 8.8 mg/dL (ref 8.4–10.5)
Chloride: 104 mEq/L (ref 96–112)
Creatinine, Ser: 0.99 mg/dL (ref 0.50–1.35)
GFR calc Af Amer: >90 mL/min (ref >90)
GFR calc non Af Amer: >90 mL/min (ref >90)
Glucose, Bld: 159 mg/dL — ABNORMAL HIGH (ref 70–99)
Potassium: 4.3 mEq/L (ref 3.7–5.3)
Sodium: 144 mEq/L (ref 137–147)

## 2013-10-01 MED ORDER — DOCUSATE SODIUM 100 MG PO CAPS
100.0000 mg | ORAL_CAPSULE | Freq: Two times a day (BID) | ORAL | Status: DC | PRN
  Administered 2013-10-01 – 2013-10-07 (×2): 100 mg via ORAL
  Filled 2013-10-01 (×3): qty 1

## 2013-10-01 MED ORDER — CARVEDILOL 12.5 MG PO TABS
12.5000 mg | ORAL_TABLET | Freq: Two times a day (BID) | ORAL | Status: DC
  Administered 2013-10-01 – 2013-10-03 (×4): 12.5 mg via ORAL
  Filled 2013-10-01 (×6): qty 1

## 2013-10-01 NOTE — Progress Notes (Signed)
ANTICOAGULATION CONSULT NOTE - FOLLOW UP  Pharmacy Consult:  Xarelto Indication: nonvalvular atrial fibrillation  No Known Allergies  Patient Measurements: Height: 6\' 2"  (188 cm) Weight: 452 lb 6.4 oz (205.207 kg) (c scale; a scale is broken) IBW/kg (Calculated) : 82.2  Vital Signs: Temp: 97.5 F (36.4 C) (03/09 7353) Temp src: Oral (03/09 0638) BP: 127/61 mmHg (03/09 0638) Pulse Rate: 88 (03/09 0638)  Labs:  Recent Labs  09/29/13 0900 09/30/13 0533 10/01/13 0604  CREATININE 1.06 1.07 0.99    Estimated Creatinine Clearance: 167.8 ml/min (by C-G formula based on Cr of 0.99).    Assessment: 48 yo morbidly obese Maurice Nguyen presents to ED with SOB, hypertensive urgency, afib with RVR, and LE edema (non-compliance on his Lasix).  Patient reports palpitation episodes over last year and last April was found to be in Afib during admission, then lost to follow-up.  Coumadin is not considered in this patient due to medical non-compliance.  Patient to remain on Xarelto.  His renal function is stable.  No bleeding reported.   Goal of Therapy:  Full anticoagulation with compliance Monitor platelets by anticoagulation protocol: Yes    Plan:  - Continue Xarelto 20mg  PO daily with evening meal - Consider PRN BMET / CBC - Pharmacy will sign off as expect dose adjustment to be unnecessary.  Thank you for the consult! - Xarelto education completed    Ayrton Mcvay D. Laney Potash, PharmD, BCPS Pager:  [phone] 10/01/2013, 10:03 AM

## 2013-10-01 NOTE — Progress Notes (Addendum)
I spent some time discussing HF with Mr. Pentz and his wife/girlfriend.  He is new to HF and claims to not know much regarding this diagnosis.  He is motivated and willing to learn.  We reviewed the importance of daily weights, signs and symptoms of HF, when to call the physician and low sodium foods.  He has also received diet education from the dietician regarding low sodium foods.  He would like a scale for home use.  I will coordinate with Care Management to investigate a way to get a scale that is large capacity to fit his need.  He is now agreeable to [facility] services at discharge.  I will plan to see him again prior to discharge to reinforce teaching.  Driscilla Moats RN, BSN, PCCN--Heart Failure Statistician

## 2013-10-01 NOTE — Progress Notes (Signed)
Nutrition Education Note  RD consulted for nutrition education regarding new onset CHF.  RD provided "Heart Failure Nutrition Therapy" handout from the Academy of [employer]. Reviewed patient's dietary recall. Provided examples on ways to decrease sodium intake in diet. Discouraged intake of processed foods and use of salt shaker. Encouraged fresh fruits and vegetables as well as whole grain sources of carbohydrates to maximize fiber intake. Provided "Sodium Content of Foods List" from the Academy of Nutrition and Dietetics for pt to use for reference.   RD discussed why it is important for patient to adhere to diet recommendations, and emphasized the role of fluids, foods to avoid, and importance of weighing self daily. Teach back method used. Pt was able to identify several high sodium foods in his diet PTA that he plan on avoiding from now on.   Expect good compliance. RD contact information provided. Encouraged pt to contact RD with any additional questions or concerns.   Body mass index is 58.06 kg/(m^2). Pt meets criteria for Morbid Obesity based on current BMI.  Current diet order is Carb Modified/2 Gram Sodium, patient is consuming approximately 100% of meals at this time. Labs and medications reviewed. No further nutrition interventions warranted at this time. If additional nutrition issues arise, please re-consult RD.   Ian Malkin RD, LDN Inpatient Clinical Dietitian Pager: [phone] After Hours Pager: [phone]

## 2013-10-01 NOTE — Progress Notes (Signed)
Subjective: Breathing improved, but continues to complain of bilateral LEE/ pain. Denies CP, palpitations, dizziness.   Objective: Vital signs in last 24 hours: Temp:  [97.5 F (36.4 C)-97.9 F (36.6 C)] 97.5 F (36.4 C) (03/09 11910638) Pulse Rate:  [74-89] 89 (03/09 1018) Resp:  [16-20] 16 (03/09 1018) BP: (109-143)/(61-94) 109/85 mmHg (03/09 1018) SpO2:  [93 %-99 %] 99 % (03/09 1018) Weight:  [452 lb 6.4 oz (205.207 kg)] 452 lb 6.4 oz (205.207 kg) (03/09 0426) Last BM Date: 09/30/13  Intake/Output from previous day: 03/08 0701 - 03/09 0700 In: 603 [P.O.:600; I.V.:3] Out: 4175 [Urine:4175] Intake/Output this shift: Total I/O In: 240 [P.O.:240] Out: 1175 [Urine:1175]  Medications Current Facility-Administered Medications  Medication Dose Route Frequency Provider Last Rate Last Dose  . 0.9 %  sodium chloride infusion  250 mL Intravenous PRN [name], NP      . aspirin EC tablet 81 mg  81 mg Oral Daily [name], NP   81 mg at 10/01/13 1021  . carvedilol (COREG) tablet 6.25 mg  6.25 mg Oral BID WC Lennette Biharihomas A Kelly, MD   6.25 mg at 10/01/13 1022  . diltiazem (CARDIZEM) tablet 30 mg  30 mg Oral 3 times per day [name], MD   30 mg at 10/01/13 0645  . furosemide (LASIX) injection 40 mg  40 mg Intravenous 3 times per day [name], MD   40 mg at 10/01/13 0645  . glipiZIDE (GLUCOTROL) tablet 10 mg  10 mg Oral BID AC [name], NP   10 mg at 10/01/13 0645  . HYDROcodone-acetaminophen (NORCO/VICODIN) 5-325 MG per tablet 1-2 tablet  1-2 tablet Oral Q6H PRN [name], MD   2 tablet at 09/30/13 2142  . insulin aspart (novoLOG) injection 0-15 Units  0-15 Units Subcutaneous TID WC [name], NP   2 Units at 10/01/13 0800  . insulin aspart (novoLOG) injection 0-5 Units  0-5 Units Subcutaneous QHS [name], NP   2 Units at 09/30/13 2340  . nitroGLYCERIN (NITROSTAT) SL tablet 0.4 mg  0.4 mg Sublingual Q5 Min x 3 PRN [name], PA-C   0.4 mg at 09/27/13 1249    . potassium chloride SA (K-DUR,KLOR-CON) CR tablet 20 mEq  20 mEq Oral BID [name], NP   20 mEq at 10/01/13 1023  . ramipril (ALTACE) capsule 2.5 mg  2.5 mg Oral BID [name], NP   2.5 mg at 10/01/13 1021  . Rivaroxaban (XARELTO) tablet 20 mg  20 mg Oral Q supper [name], RPH   20 mg at 09/30/13 1640  . sodium chloride 0.9 % injection 3 mL  3 mL Intravenous Q12H [name], NP   3 mL at 10/01/13 1022  . sodium chloride 0.9 % injection 3 mL  3 mL Intravenous PRN [name], NP      . zolpidem Remus Loffler(AMBIEN) tablet 5 mg  5 mg Oral QHS PRN [name], MD   5 mg at 10/01/13 0018    PE: General appearance: alert, cooperative, no distress and morbidly obese Lungs: rales could not be excluded, as ascultation was difficult due to body habitus Heart: irregularly irregular rhythm Extremities: tense bilateral lower extremity/ pedal edema Pulses: 2+ and symmetric Skin: warm and dry Neurologic: Grossly normal  Lab Results:  No results found for this basename: WBC, HGB, HCT, PLT,  in the last 72 hours BMET  Recent Labs  09/29/13 0900 09/30/13 0533 10/01/13 0604  NA 138 141 144  K 3.9 3.9 4.3  CL 99 101 104  CO2 26 27 29   GLUCOSE 183* 214* 159*  BUN 18 18 17   CREATININE 1.06 1.07 0.99  CALCIUM 8.8 8.9 8.8     Assessment/Plan  Principal Problem:   Acute CHF (congestive heart failure) Active Problems:   DIABETES MELLITUS, TYPE II, UNCONTROLLED   HYPERTENSION   Obesity, morbid, BMI 50 or higher   Back pain, chronic   Atrial fibrillation with RVR   CHF (congestive heart failure), will eval for systolic HF   Hypertensive urgency  Plan: He continues in atrial fibrillation with a ventricular rate in the 90s. Asymptomatic. BP is stable. He was also noted to have a 23 beat run of VT on telemetry this am. He was also asymptomatic. Mg was checked and was WNL, as was K.  Continue Coreg and Cardizem for rate control.  Continue Xarelto for A/C. He continues to diurese well. -4.2L  in last 24 hrs and -7.8 L since admission. Breathing has improved significantly, but he continues to have significant bilateral LEE/ pedal edema. He is on 40 mg IV Lasix Q8H. ? If we should increase the dose. Renal function is normal and electrolytes are WNL. Continue daily weights, strict I/Os and 2 gm Na restriction. MD to follow.     LOS: 4 days    Maurice Nguyen 10/01/2013 10:27 AM  Attending Note:   The patient was seen and examined.  Agree with assessment and plan as noted above.  Changes made to the above note as needed.  Pt is making slow progress with his leg edema   A/p  1.  Atrial fib:  Rate is better.  Since he has mild systolic CHF, I would favor rate control with coreg instead of diltiazem.  Will increase coreg and DC dilt.  Cont. xarelto  2. Chronic systolic CHf:   He likely has diastolic dysfunction as well but we cannot tell with AFib.  Cont. With lasix.  He still has lot of volume.  He needs to elevated his legs higher than his heart.  Will order compression hose ( TED hose)  He still eats a fair amount of salt.  We had a discussion about correcting this  3. Morbid obesity:  He is morbidly obese.  Body mass index is 58.06 kg/(m^2).  We discussed this and his need to lose weight.    Vesta Mixer, [name], MD, [facility] 10/01/2013, 12:45 PM

## 2013-10-01 NOTE — Progress Notes (Signed)
Chart review complete.  Patient is not eligible for [facility] Care Management services because his/her PCP is not a [facility] primary care provider or is not [facility] affiliated.  For any additional questions or new referrals please contact Tim Henderson BSN RN [facility] Liaison at [phone] °

## 2013-10-01 NOTE — Clinical Documentation Improvement (Signed)
PLEASE CLARIFY THE TYPE OF CHF Possible Clinical Conditions?  Chronic Systolic Congestive Heart Failure Chronic Diastolic Congestive Heart Failure Chronic Systolic & Diastolic Congestive Heart Failure Acute Systolic Congestive Heart Failure Acute Diastolic Congestive Heart Failure Acute Systolic & Diastolic Congestive Heart Failure Acute on Chronic Systolic Congestive Heart Failure Acute on Chronic Diastolic Congestive Heart Failure Acute on Chronic Systolic & Diastolic Congestive Heart Failure Other Condition Cannot Clinically Determine  Supporting Information: (as per notes)"Acute CHF (congestive heart failure)"   Thank You, Dollene Primrose, BSN, CCDS, Clinical Documentation Specialist:  [phone]  [phone] [place]- [facility] Information Management

## 2013-10-01 NOTE — Progress Notes (Signed)
In to discuss PCP status with patient and girlfriend.  Patient desires to remain with Dr Bruna Potter as his PCP.  He therefore remains ineligible for [facility] services.  Patient requested a hospital bed, recliner, and scale.  Will relay request to [facility].  Of note, [facility] Care Management services does not replace or interfere with any services that are arranged by inpatient case management or social work.  For additional questions or referrals please contact Anibal Henderson BSN RN [facility] Liaison at [phone].

## 2013-10-01 NOTE — Plan of Care (Signed)
Problem: Phase I Progression Outcomes Goal: EF % per last Echo/documented,Core Reminder form on chart Outcome: Completed/Met Date Met:  10/01/13 EF 45-50% per ECHO performed on 09/28/13.

## 2013-10-01 NOTE — Progress Notes (Signed)
UR completed Bryam Taborda K. Cejay Cambre, RN, BSN, MSHL, CCM  10/01/2013 2:07 PM

## 2013-10-01 NOTE — Progress Notes (Signed)
Pt with 21 beats of V tach this AM. Pt asymptomatic and sleeping in bed. BMET WDL from this AM. Mag not included in AM labs. Will notify MD and continue to monitor.

## 2013-10-02 ENCOUNTER — Encounter (HOSPITAL_COMMUNITY): Payer: Self-pay | Admitting: Cardiology

## 2013-10-02 DIAGNOSIS — I4891 Unspecified atrial fibrillation: Secondary | ICD-10-CM

## 2013-10-02 DIAGNOSIS — Z9229 Personal history of other drug therapy: Secondary | ICD-10-CM | POA: Insufficient documentation

## 2013-10-02 DIAGNOSIS — I5043 Acute on chronic combined systolic (congestive) and diastolic (congestive) heart failure: Secondary | ICD-10-CM

## 2013-10-02 DIAGNOSIS — I509 Heart failure, unspecified: Secondary | ICD-10-CM

## 2013-10-02 DIAGNOSIS — Z7901 Long term (current) use of anticoagulants: Secondary | ICD-10-CM

## 2013-10-02 HISTORY — DX: Long term (current) use of anticoagulants: Z79.01

## 2013-10-02 LAB — GLUCOSE, CAPILLARY
Glucose-Capillary: 151 mg/dL — ABNORMAL HIGH (ref 70–99)
Glucose-Capillary: 161 mg/dL — ABNORMAL HIGH (ref 70–99)
Glucose-Capillary: 187 mg/dL — ABNORMAL HIGH (ref 70–99)
Glucose-Capillary: 137 mg/dL — ABNORMAL HIGH (ref 70–99)

## 2013-10-02 NOTE — Progress Notes (Signed)
Report given to receiving RN. Patient in bed resting and on the telephone. No verbal complaints.

## 2013-10-02 NOTE — Progress Notes (Addendum)
Subjective: Breathing improved, but continues to complain of bilateral LEE/ pain. Denies CP, palpitations, dizziness.   Objective: Vital signs in last 24 hours: Temp:  [97.8 F (36.6 C)-98.4 F (36.9 C)] 98.4 F (36.9 C) (03/09 2041) Pulse Rate:  [82-93] 82 (03/10 0908) Resp:  [18] 18 (03/09 2041) BP: (117-147)/(52-78) 117/68 mmHg (03/10 0908) SpO2:  [97 %-99 %] 99 % (03/09 2041) Weight:  [449 lb 6.4 oz (203.847 kg)] 449 lb 6.4 oz (203.847 kg) (03/10 0241) Last BM Date: 10/01/13  Intake/Output from previous day: 03/09 0701 - 03/10 0700 In: 1640 [P.O.:1640] Out: 4600 [Urine:4600] Intake/Output this shift: Total I/O In: 120 [P.O.:120] Out: 1000 [Urine:1000]  Medications Current Facility-Administered Medications  Medication Dose Route Frequency Provider Last Rate Last Dose  . 0.9 %  sodium chloride infusion  250 mL Intravenous PRN [name], NP      . aspirin EC tablet 81 mg  81 mg Oral Daily [name], NP   81 mg at 10/02/13 0908  . carvedilol (COREG) tablet 12.5 mg  12.5 mg Oral BID WC Brittainy Simmons, PA-C   12.5 mg at 10/02/13 [id number]  . docusate sodium (COLACE) capsule 100 mg  100 mg Oral BID PRN Brittainy Simmons, PA-C   100 mg at 10/01/13 1442  . furosemide (LASIX) injection 40 mg  40 mg Intravenous 3 times per day [name], MD   40 mg at 10/02/13 [id number]  . glipiZIDE (GLUCOTROL) tablet 10 mg  10 mg Oral BID AC [name], NP   10 mg at 10/02/13 [id number]  . HYDROcodone-acetaminophen (NORCO/VICODIN) 5-325 MG per tablet 1-2 tablet  1-2 tablet Oral Q6H PRN [name], MD   2 tablet at 10/02/13 0849  . insulin aspart (novoLOG) injection 0-15 Units  0-15 Units Subcutaneous TID WC [name], NP   3 Units at 10/02/13 1150  . insulin aspart (novoLOG) injection 0-5 Units  0-5 Units Subcutaneous QHS [name], NP   2 Units at 09/30/13 2340  . nitroGLYCERIN (NITROSTAT) SL tablet 0.4 mg  0.4 mg Sublingual Q5 Min x 3 PRN [name], PA-C   0.4 mg at 09/27/13 1249    . potassium chloride SA (K-DUR,KLOR-CON) CR tablet 20 mEq  20 mEq Oral BID [name], NP   20 mEq at 10/02/13 0909  . ramipril (ALTACE) capsule 2.5 mg  2.5 mg Oral BID [name], NP   2.5 mg at 10/02/13 0908  . Rivaroxaban (XARELTO) tablet 20 mg  20 mg Oral Q supper [name], RPH   20 mg at 10/01/13 1828  . sodium chloride 0.9 % injection 3 mL  3 mL Intravenous Q12H [name], NP   3 mL at 10/02/13 1000  . sodium chloride 0.9 % injection 3 mL  3 mL Intravenous PRN [name], NP      . zolpidem Remus Loffler(AMBIEN) tablet 5 mg  5 mg Oral QHS PRN [name], MD   5 mg at 10/01/13 0018    PE: General appearance: alert, cooperative, no distress and morbidly obese Lungs: rales could not be excluded, as ascultation was difficult due to body habitus Heart: irregularly irregular rhythm Extremities: tense bilateral lower extremity/ pedal edema Pulses: 2+ and symmetric Skin: warm and dry Neurologic: Grossly normal  Lab Results:  No results found for this basename: WBC, HGB, HCT, PLT,  in the last 72 hours BMET  Recent Labs  09/30/13 0533 10/01/13 0604  NA 141 144  K 3.9 4.3  CL 101 104  CO2 27 29  GLUCOSE 214* 159*  BUN 18 17  CREATININE 1.07 0.99  CALCIUM 8.9 8.8     Assessment/Plan  Principal Problem:   Acute combined systolic and diastolic CHF, NYHA class 4, difficult to assess Diastolic function on echo Active Problems:   DIABETES MELLITUS, TYPE II, UNCONTROLLED   HYPERTENSION   Obesity, morbid, BMI 50 or higher   Back pain, chronic   Atrial fibrillation with RVR, now rate controlled   Hypertensive urgency   Anticoagulated, on Xarelto, Chads2Vas score 2  Plan: He continues in atrial fibrillation with a ventricular rate in the 90s. Asymptomatic. BP is stable. He was also noted to have a 23 beat run of VT on telemetry this am. He was also asymptomatic. Mg was checked and was WNL, as was K.  Continue Coreg and Cardizem for rate control.  Continue Xarelto for A/C. He  continues to diurese well. -4.2L in last 24 hrs and -7.8 L since admission. Breathing has improved significantly, but he continues to have significant bilateral LEE/ pedal edema. He is on 40 mg IV Lasix Q8H. ? If we should increase the dose. Renal function is normal and electrolytes are WNL. Continue daily weights, strict I/Os and 2 gm Na restriction. MD to follow.     LOS: 5 days    Brittainy M. Delmer Islam 10/02/2013 1:17 PM  Attending Note:   The patient was seen and examined.  Agree with assessment and plan as noted above.  Changes made to the above note as needed.  Pt is making slow progress with his leg edema   A/p  1.  Atrial fib:  Rate is better.  Since he has mild systolic CHF, I would favor rate control with coreg instead of diltiazem.  Will increase coreg and DC dilt.  Cont. xarelto  2. Chronic systolic CHf:   He likely has diastolic dysfunction as well but we cannot tell with AFib.  Cont. With lasix.  He still has lot of volume.  He needs to elevated his legs higher than his heart.  Will order compression hose ( TED hose)  He still eats a fair amount of salt.  We had a discussion about correcting this He still has a good output from IV lasix.  I would rather not use metalazone due to the electrolyte shifts.   We tried to elevate his legs today but he became very short of breath and could not tolerate it.    3. Morbid obesity:  He is morbidly obese.  Body mass index is 57.68 kg/(m^2).  We discussed this and his need to lose weight.    Vesta Mixer, [name], MD, [facility] 10/02/2013, 1:17 PM

## 2013-10-02 NOTE — Progress Notes (Signed)
Pt. Alert and oriented this am. No s/s of distress or discomfort noted. Pt. Resting in bed quietly. Family at bedside. Call light within reach. VSS. Blood pressure 118/68, pulse 89, temperature 98.4 F (36.9 C), temperature source Oral, resp. rate 18, height 6\' 2"  (1.88 m), weight 203.847 kg (449 lb 6.4 oz), SpO2 99.00%. RN will continue to monitor pt. For changes in condition. Dietrick Barris, Cheryll Dessert

## 2013-10-02 NOTE — Progress Notes (Addendum)
         Subjective: Complains of bil leg pain though L>R especially foot  Objective: Vital signs in last 24 hours: Temp:  [97.8 F (36.6 C)-98.4 F (36.9 C)] 98.4 F (36.9 C) (03/09 2041) Pulse Rate:  [82-93] 82 (03/10 0908) Resp:  [16-18] 18 (03/09 2041) BP: (109-147)/(52-85) 117/68 mmHg (03/10 0908) SpO2:  [97 %-99 %] 99 % (03/09 2041) Weight:  [449 lb 6.4 oz (203.847 kg)] 449 lb 6.4 oz (203.847 kg) (03/10 0241) Weight change: -3 lb (-1.361 kg) Last BM Date: 10/01/13 Intake/Output from previous day:  -2960 (-9635 since admit) wt 449.6 down from 460 on admit 03/09 0701 - 03/10 0700 In: 1640 [P.O.:1640] Out: 4600 [Urine:4600] Intake/Output this shift: Total I/O In: 120 [P.O.:120] Out: 650 [Urine:650]  PE: General:Pleasant affect, NAD Skin:Warm and dry, brisk capillary refill HEENT:normocephalic, sclera clear, mucus membranes moist Heart:irreg irreg without murmur, gallup, rub or click Lungs:clear ant without rales, rhonchi, or wheezes XBL:TJQZE, soft, non tender, + BS, do not palpate liver spleen or masses Ext:3+ lower ext edema,Lt foot with pain no redness, 2+ radial pulses Neuro:alert and oriented, MAE, follows commands, + facial symmetry   Lab Results: No results found for this basename: WBC, HGB, HCT, PLT,  in the last 72 hours BMET  Recent Labs  09/30/13 0533 10/01/13 0604  NA 141 144  K 3.9 4.3  CL 101 104  CO2 27 29  GLUCOSE 214* 159*  BUN 18 17  CREATININE 1.07 0.99  CALCIUM 8.9 8.8   No results found for this basename: TROPONINI, CK, MB,  in the last 72 hours   Lab Results  Component Value Date   HGBA1C 7.1* 09/27/2013     Lab Results  Component Value Date   TSH 2.421 09/27/2013      Studies/Results: No results found.  Medications: I have reviewed the patient's current medications. Scheduled Meds: . aspirin EC  81 mg Oral Daily  . carvedilol  12.5 mg Oral BID WC  . furosemide  40 mg Intravenous 3 times per day  . glipiZIDE  10 mg  Oral BID AC  . insulin aspart  0-15 Units Subcutaneous TID WC  . insulin aspart  0-5 Units Subcutaneous QHS  . potassium chloride  20 mEq Oral BID  . ramipril  2.5 mg Oral BID  . rivaroxaban  20 mg Oral Q supper  . sodium chloride  3 mL Intravenous Q12H   Continuous Infusions:  PRN Meds:.sodium chloride, docusate sodium, HYDROcodone-acetaminophen, nitroGLYCERIN, sodium chloride, zolpidem  Assessment/Plan: Principal Problem:   Acute combined systolic and diastolic CHF, NYHA class 4, difficult to assess Diastolic function on echo Active Problems:   DIABETES MELLITUS, TYPE II, UNCONTROLLED   HYPERTENSION   Obesity, morbid, BMI 50 or higher   Back pain, chronic   Atrial fibrillation with RVR   CHF (congestive heart failure), will eval for systolic HF   Hypertensive urgency   PLAN: A fib continues, recent VT, 5 beats last pm anticoag with xarelto, plan for sleep study  Glucose stable up to 220 at times. BP mostly controlled Still aways to go with edema,     LOS: 5 days   Time spent with pt. : 15 minutes. [facility] R  Nurse Practitioner Certified Pager [phone] or after 5pm and on weekends call [phone] 10/02/2013, 9:34 AM   See my note from same day.  Vesta Mixer, [name], MD, [facility] 10/02/2013, 1:28 PM Office - [phone] Pager [phone]

## 2013-10-02 NOTE — Evaluation (Signed)
Physical Therapy Evaluation Patient Details Name: Maurice HissWilliam A Chicoine Jr. MRN: [id number] DOB: 09/07/1965 Today's Date: 10/02/2013 Time: [id number] PT Time Calculation (min): 45 min  PT Assessment / Plan / Recommendation History of Present Illness  Presents today with several episodes of palpitations over the past year and recently increased LE edema over several weeks.  Last PM developed SOB and could not lay down and presented to the ER in hypertensive urgency, afib with RVR and acute CHF. Pt reports he is on disabilyt  because of his back pain  Clinical Impression  Maurice Nguyen is limited in gait by pain in legs from swelling and from previous back disability. He has been instructed to do range of motion and deep breathing exercises, skin care and elevation to help control his edema.  He is awaiting compression hose. I encouraged nursing to supervise his hallway walking 4 times and day and will follow for PT for stair practice prior to d/c to home.    PT Assessment  Patient needs continued PT services    Follow Up Recommendations  No PT follow up    Does the patient have the potential to tolerate intense rehabilitation      Barriers to Discharge        Equipment Recommendations  None recommended by PT    Recommendations for Other Services     Frequency Min 3X/week    Precautions / Restrictions Precautions Precautions: None Restrictions Weight Bearing Restrictions: No   Pertinent Vitals/Pain Pain in feet is rated at 8-9/10      Mobility  Bed Mobility Overal bed mobility: Modified Independent General bed mobility comments: pt is able to move  himself in the bed despite pain Transfers Overall transfer level: Independent Equipment used: None General transfer comment: pt did not want to use a walker or handrail despite difficulty with weight bearing due to pain Ambulation/Gait Ambulation/Gait assistance: Supervision Ambulation Distance (Feet): 100 Feet Assistive device:  None Gait Pattern/deviations: Antalgic;Decreased stance time - left;Decreased weight shift to left;Decreased step length - right;Decreased step length - left;Wide base of support Gait velocity: decreased Gait velocity interpretation: Below normal speed for age/gender General Gait Details: excessive lateral shoulder movement.  Pt reports this is the way he has had to walk due to back pain, but it is worse due to pain in legs    Exercises General Exercises - Lower Extremity Ankle Circles/Pumps: AROM;Both;10 reps;Supine Quad Sets: AROM;Both;10 reps;Supine Gluteal Sets: AROM;Both;10 reps;Supine Other Exercises Other Exercises: diaphragmatic breathing Other Exercises: pt and family encouraged to keep lotion on skin for moisturiization and gently stretch skin for lymphatic stimulaiton   PT Diagnosis: Difficulty walking;Generalized weakness;Acute pain  PT Problem List: Pain;Decreased mobility;Decreased activity tolerance PT Treatment Interventions: Gait training;Stair training;Therapeutic activities     PT Goals(Current goals can be found in the care plan section) Acute Rehab PT Goals Patient Stated Goal: to get rid of his pain PT Goal Formulation: With patient Time For Goal Achievement: 10/09/13 Potential to Achieve Goals: Good  Visit Information  Last PT Received On: 10/02/13 Assistance Needed: +1 History of Present Illness: Presents today with several episodes of palpitations over the past year and recently increased LE edema over several weeks.  Last PM developed SOB and could not lay down and presented to the ER in hypertensive urgency, afib with RVR and acute CHF. Pt reports he is on disabilyt  because of his back pain       Prior Functioning  Home Living Family/patient expects to be discharged to::  Private residence Living Arrangements: Spouse/significant other Available Help at Discharge: Family Type of Home: House Home Access: Stairs to enter Secretary/administrator of Steps:  2 Entrance Stairs-Rails: None Home Layout: One level Home Equipment: None Additional Comments: pt reports he does not want to use a walker  Prior Function Level of Independence: Independent Communication Communication: No difficulties    Cognition  Cognition Arousal/Alertness: Awake/alert Behavior During Therapy: WFL for tasks assessed/performed Overall Cognitive Status: Within Functional Limits for tasks assessed    Extremity/Trunk Assessment Upper Extremity Assessment Upper Extremity Assessment: Overall WFL for tasks assessed Lower Extremity Assessment Lower Extremity Assessment: RLE deficits/detail;LLE deficits/detail RLE Deficits / Details: nonpitting edema in top of foot, very tight skin on leg with hemosiderin stain. Very painful to touch. Pt is able to activate all muscle groups, but activerange of motion is limted by pain in back RLE: Unable to fully assess due to pain LLE Deficits / Details: nonpitting edema in top of foot, very tight skin on leg with hemosiderin stain. Very painful to touch. Pt is able to activate all muscle groups, but activerange of motion is limted by pain in back LLE: Unable to fully assess due to pain Cervical / Trunk Assessment Cervical / Trunk Assessment: Other exceptions Cervical / Trunk Exceptions: morbid obesity in trunk and abdomen   Balance Balance Overall balance assessment: Independent General Comments General comments (skin integrity, edema, etc.): painful edema in feet and legs  End of Session PT - End of Session Activity Tolerance: Patient limited by pain Patient left: in bed;Other (comment) (with legs elevated) Nurse Communication: Mobility status;Other (comment) ( please supervise ambulation in hallway 3-4 times a day)  GP    Rosey Bath K. Manson Passey, PT [phone] 10/02/2013, 10:52 AM

## 2013-10-03 DIAGNOSIS — I4892 Unspecified atrial flutter: Secondary | ICD-10-CM

## 2013-10-03 DIAGNOSIS — Z7901 Long term (current) use of anticoagulants: Secondary | ICD-10-CM

## 2013-10-03 LAB — GLUCOSE, CAPILLARY
Glucose-Capillary: 148 mg/dL — ABNORMAL HIGH (ref 70–99)
Glucose-Capillary: 182 mg/dL — ABNORMAL HIGH (ref 70–99)
Glucose-Capillary: 181 mg/dL — ABNORMAL HIGH (ref 70–99)
Glucose-Capillary: 118 mg/dL — ABNORMAL HIGH (ref 70–99)

## 2013-10-03 LAB — BASIC METABOLIC PANEL
BUN: 21 mg/dL (ref 6–23)
CO2: 26 mEq/L (ref 19–32)
Calcium: 9.2 mg/dL (ref 8.4–10.5)
Chloride: 101 mEq/L (ref 96–112)
Creatinine, Ser: 1.12 mg/dL (ref 0.50–1.35)
GFR calc Af Amer: 87 mL/min — ABNORMAL LOW (ref >90)
GFR calc non Af Amer: 75 mL/min — ABNORMAL LOW (ref >90)
Glucose, Bld: 171 mg/dL — ABNORMAL HIGH (ref 70–99)
Potassium: 4.4 mEq/L (ref 3.7–5.3)
Sodium: 140 mEq/L (ref 137–147)

## 2013-10-03 LAB — URIC ACID: Uric Acid, Serum: 8.8 mg/dL — ABNORMAL HIGH (ref 4.0–7.8)

## 2013-10-03 LAB — PRO B NATRIURETIC PEPTIDE: Pro B Natriuretic peptide (BNP): 578.7 pg/mL — ABNORMAL HIGH (ref 0–125)

## 2013-10-03 MED ORDER — DILTIAZEM HCL 100 MG IV SOLR
5.0000 mg/h | INTRAVENOUS | Status: DC
  Administered 2013-10-03 (×2): 5 mg/h via INTRAVENOUS
  Filled 2013-10-03 (×3): qty 100

## 2013-10-03 MED ORDER — CARVEDILOL 25 MG PO TABS
25.0000 mg | ORAL_TABLET | Freq: Two times a day (BID) | ORAL | Status: DC
  Administered 2013-10-03 – 2013-10-04 (×2): 25 mg via ORAL
  Filled 2013-10-03 (×4): qty 1

## 2013-10-03 MED ORDER — DILTIAZEM HCL 100 MG IV SOLR: 10.0000 mg | Freq: Once | INTRAVENOUS | Status: DC

## 2013-10-03 MED ORDER — DILTIAZEM LOAD VIA INFUSION
10.0000 mg | Freq: Once | INTRAVENOUS | Status: AC
  Administered 2013-10-03: 10 mg via INTRAVENOUS
  Filled 2013-10-03: qty 10

## 2013-10-03 MED ORDER — CARVEDILOL 12.5 MG PO TABS
12.5000 mg | ORAL_TABLET | Freq: Once | ORAL | Status: AC
  Administered 2013-10-03: 12.5 mg via ORAL
  Filled 2013-10-03: qty 1

## 2013-10-03 NOTE — Progress Notes (Signed)
Report given to receiving RN. Patient sitting in chair watching TV. No verbal complaints and no signs or symptoms of distress or discomfort.

## 2013-10-03 NOTE — Progress Notes (Addendum)
Subjective:  48 yo with hx of PAF, he was admitted with 1-2 weeks of increased leg edema.Marland Kitchen  He was also found to have AF with RVR.   We have been diuresing him aggressively. - 2.9 liters on 3/9-3/10 -3.5 liters on 3/10-3/11  He had an episode of rapid AFlutter  last night.  He was started on Dilt drip and rate is now better.    Objective: Vital signs in last 24 hours: Temp:  [97.8 F (36.6 C)-98.4 F (36.9 C)] 97.9 F (36.6 C) (03/11 0546) Pulse Rate:  [50-150] 73 (03/11 0939) Resp:  [18] 18 (03/11 0546) BP: (122-143)/(56-85) 122/73 mmHg (03/11 0939) SpO2:  [95 %-98 %] 98 % (03/11 0546) Weight:  [444 lb 9.6 oz (201.669 kg)] 444 lb 9.6 oz (201.669 kg) (03/11 0546) Last BM Date: 10/03/13  Intake/Output from previous day: 03/10 0701 - 03/11 0700 In: 720 [P.O.:720] Out: 4200 [Urine:4200] Intake/Output this shift:    Medications Current Facility-Administered Medications  Medication Dose Route Frequency Provider Last Rate Last Dose  . 0.9 %  sodium chloride infusion  250 mL Intravenous PRN Nada Boozer, NP      . aspirin EC tablet 81 mg  81 mg Oral Daily Nada Boozer, NP   81 mg at 10/03/13 0940  . carvedilol (COREG) tablet 12.5 mg  12.5 mg Oral BID WC Brittainy Simmons, PA-C   12.5 mg at 10/03/13 0543  . diltiazem (CARDIZEM) 100 mg in dextrose 5 % 100 mL infusion  5 mg/hr Intravenous Continuous Christie Nottingham, MD 5 mL/hr at 10/03/13 0101 5 mg/hr at 10/03/13 0101  . docusate sodium (COLACE) capsule 100 mg  100 mg Oral BID PRN Brittainy Simmons, PA-C   100 mg at 10/01/13 1442  . furosemide (LASIX) injection 40 mg  40 mg Intravenous 3 times per day Wendall Stade, MD   40 mg at 10/03/13 0543  . glipiZIDE (GLUCOTROL) tablet 10 mg  10 mg Oral BID AC Nada Boozer, NP   10 mg at 10/03/13 0543  . HYDROcodone-acetaminophen (NORCO/VICODIN) 5-325 MG per tablet 1-2 tablet  1-2 tablet Oral Q6H PRN Leeann Must, MD   2 tablet at 10/03/13 0945  . insulin aspart (novoLOG) injection 0-15  Units  0-15 Units Subcutaneous TID WC Nada Boozer, NP   2 Units at 10/03/13 [phone]  . insulin aspart (novoLOG) injection 0-5 Units  0-5 Units Subcutaneous QHS Nada Boozer, NP   2 Units at 09/30/13 2340  . nitroGLYCERIN (NITROSTAT) SL tablet 0.4 mg  0.4 mg Sublingual Q5 Min x 3 PRN Roxy Horseman, PA-C   0.4 mg at 09/27/13 1249  . potassium chloride SA (K-DUR,KLOR-CON) CR tablet 20 mEq  20 mEq Oral BID Nada Boozer, NP   20 mEq at 10/03/13 0940  . ramipril (ALTACE) capsule 2.5 mg  2.5 mg Oral BID Nada Boozer, NP   2.5 mg at 10/03/13 2119  . Rivaroxaban (XARELTO) tablet 20 mg  20 mg Oral Q supper Kaylyn Layer, RPH   20 mg at 10/02/13 1600  . sodium chloride 0.9 % injection 3 mL  3 mL Intravenous Q12H Nada Boozer, NP   3 mL at 10/03/13 0940  . sodium chloride 0.9 % injection 3 mL  3 mL Intravenous PRN Nada Boozer, NP      . zolpidem [facility]) tablet 5 mg  5 mg Oral QHS PRN Leeann Must, MD   5 mg at 10/01/13 0018    PE: General appearance: alert, cooperative, no distress and morbidly  obese Lungs: mostly clear ( obese , difficult to hear)  Heart: RR,  ( flutter with 4;1)  Extremities: 1-2+ pitting edema Pulses: 2+ and symmetric Skin: warm and dry Neurologic: Grossly normal  Lab Results:  No results found for this basename: WBC, HGB, HCT, PLT,  in the last 72 hours BMET  Recent Labs  10/01/13 0604 10/03/13 0625  NA 144 140  K 4.3 4.4  CL 104 101  CO2 29 26  GLUCOSE 159* 171*  BUN 17 21  CREATININE 0.99 1.12  CALCIUM 8.8 9.2     Assessment/Plan  Principal Problem:   Acute combined systolic and diastolic CHF, NYHA class 4, difficult to assess Diastolic function on echo Active Problems:   DIABETES MELLITUS, TYPE II, UNCONTROLLED   HYPERTENSION   Obesity, morbid, BMI 50 or higher   Back pain, chronic   Atrial fibrillation with RVR, now rate controlled   Hypertensive urgency   Anticoagulated, on Xarelto, Chads2Vas score 2   1.  Atrial flutter:  Pt has gone into atrial  flutter.   He has been in atrial fib previously .   He had a rapid rate intially but now it is better on IV dilt.  He is asymptmatic.  We will increase his dose of carvedilol. He is to continue with the Xarelto.  I've given him an extra dose of carvedilol this point. Anticipate being able to discontinue the diltiazem drip later today.  I anticipate doing a cardioversion in several weeks as an outpatient was he's been on his anticoagulants or 4 weeks.  2. Chronic systolic CHf: His left ventricular systolic function is mildly depressed with an EF of 45-50%.  He likely has diastolic dysfunction as well but we cannot tell with AFib.  Cont. With lasix.  He still has lot of volume.  He needs to elevated his legs higher than his heart.  Will order compression hose ( TED hose)  He still eats a fair amount of salt.  We had a discussion about correcting this He still has a good output from IV lasix.  We still have several liters to diurese before we can change him to PO Lasix.    3. Morbid obesity:  He is morbidly obese.  Body mass index is 57.06 kg/(m^2).  We discussed this and his need to lose weight.   Vesta MixerPhilip J. Echo Propp, Montez HagemanJr., MD, [facility] 10/03/2013, 10:17 AM

## 2013-10-03 NOTE — Progress Notes (Signed)
Pt. With elevated HR >150. Pt. Resting in bed. Asymptomatic. Pt. Denies chest pain or discomfort. VSS. Blood pressure 134/56, pulse 72, temperature 98.4 F (36.9 C), temperature source Oral, resp. rate 18, height 6\' 2"  (1.88 m), weight 203.847 kg (449 lb 6.4 oz), SpO2 97.00%. On call MD R. Shama made aware. New orders received and implemented. RN will continue to monitor for changes in condition. Lindon Kiel, Cheryll Dessert

## 2013-10-03 NOTE — Plan of Care (Signed)
Called by RN that patient is in atrial flutter with RVR up to 150 bpm.  Currently blood pressure is wnl.  Patient is asymptomatic and known to have AF; he is on rivaroxaban and rate control with coreg.    Plan: 1-Start Diltiazem gtt and bolus with 10mg  IV ONCE. 2-RN will keep physician team updated on heart rate response.  Christie Nottingham, MD [facility] Physician.

## 2013-10-04 DIAGNOSIS — E119 Type 2 diabetes mellitus without complications: Secondary | ICD-10-CM

## 2013-10-04 DIAGNOSIS — I5041 Acute combined systolic (congestive) and diastolic (congestive) heart failure: Secondary | ICD-10-CM

## 2013-10-04 LAB — GLUCOSE, CAPILLARY
Glucose-Capillary: 164 mg/dL — ABNORMAL HIGH (ref 70–99)
Glucose-Capillary: 139 mg/dL — ABNORMAL HIGH (ref 70–99)
Glucose-Capillary: 158 mg/dL — ABNORMAL HIGH (ref 70–99)
Glucose-Capillary: 112 mg/dL — ABNORMAL HIGH (ref 70–99)

## 2013-10-04 MED ORDER — DILTIAZEM HCL 30 MG PO TABS
30.0000 mg | ORAL_TABLET | Freq: Three times a day (TID) | ORAL | Status: DC
  Administered 2013-10-04: 30 mg via ORAL
  Filled 2013-10-04 (×4): qty 1

## 2013-10-04 MED ORDER — CARVEDILOL 12.5 MG PO TABS
12.5000 mg | ORAL_TABLET | Freq: Once | ORAL | Status: AC
  Administered 2013-10-04: 12.5 mg via ORAL
  Filled 2013-10-04: qty 1

## 2013-10-04 MED ORDER — CARVEDILOL 25 MG PO TABS
37.5000 mg | ORAL_TABLET | Freq: Two times a day (BID) | ORAL | Status: DC
  Administered 2013-10-04 – 2013-10-07 (×7): 37.5 mg via ORAL
  Filled 2013-10-04 (×8): qty 1

## 2013-10-04 NOTE — Progress Notes (Signed)
Physical Therapy Treatment and D/C Patient Details Name: Maurice Nguyen. MRN: [id number] DOB: 1966-06-27 Today's Date: 10/04/2013 Time: 3382-5053 PT Time Calculation (min): 44 min  PT Assessment / Plan / Recommendation  History of Present Illness Presents today with several episodes of palpitations over the past year and recently increased LE edema over several weeks.  Last PM developed SOB and could not lay down and presented to the ER in hypertensive urgency, afib with RVR and acute CHF. Pt reports he is on disabilyt  because of his back pain   PT Comments   Pt admitted with above. Pt currently has met goals and all education is complete.  No further PT indicated at this time.  Will sign off.     Follow Up Recommendations  No PT follow up     Does the patient have the potential to tolerate intense rehabilitation     Barriers to Discharge        Equipment Recommendations  None recommended by PT    Recommendations for Other Services    Frequency     Progress towards PT Goals Progress towards PT goals: Goals met/education completed, patient discharged from PT  Plan      Precautions / Restrictions Precautions Precautions: None Restrictions Weight Bearing Restrictions: No   Pertinent Vitals/Pain VSS, No pain    Mobility  Bed Mobility Overal bed mobility: Modified Independent Transfers Overall transfer level: Independent Equipment used: None Ambulation/Gait Ambulation/Gait assistance: Supervision Ambulation Distance (Feet): 300 Feet Assistive device: None Gait Pattern/deviations: Antalgic;Wide base of support Gait velocity interpretation: Below normal speed for age/gender General Gait Details: Pt ambulating in hall to solarium with wife.  NoLOB and feels he is at his baseline.      Exercises General Exercises - Upper Extremity Shoulder Flexion: AROM;Strengthening;Both;15 reps;Seated;Theraband Theraband Level (Shoulder Flexion): Level 4 (Blue) Shoulder Extension:  AROM;Both;15 reps;Strengthening;Seated;Theraband Theraband Level (Shoulder Extension): Level 4 (Blue) Shoulder ABduction: AROM;Both;15 reps;Seated;Theraband Theraband Level (Shoulder Abduction): Level 4 (Blue) Shoulder ADduction: AROM;Strengthening;Both;15 reps;Seated Shoulder Horizontal ABduction: AROM;Both;Strengthening;15 reps;Seated;Theraband Theraband Level (Shoulder Horizontal Abduction): Level 4 (Blue) Shoulder Horizontal ADduction: AROM;Both;10 reps;Seated;Strengthening;Theraband Theraband Level (Shoulder Horizontal Adduction): Level 4 (Blue) Elbow Flexion: AROM;Both;15 reps;Seated;Strengthening;Theraband Theraband Level (Elbow Flexion): Level 4 (Blue) Elbow Extension: AROM;Both;Strengthening;15 reps;Seated;Theraband Theraband Level (Elbow Extension): Level 4 (Blue) General Exercises - Lower Extremity Ankle Circles/Pumps: AROM;Both;10 reps;Supine Gluteal Sets: AROM;Both;10 reps;Supine Hip ABduction/ADduction: AROM;Both;15 reps;Standing Hip Flexion/Marching: AROM;Both;15 reps;Standing Toe Raises: AROM;Both;15 reps;Standing Heel Raises: AROM;Both;15 reps;Standing Mini-Sqauts: AROM;Both;15 reps;Standing Other Exercises Other Exercises: neck exercises as well as well as hip extension in standing.   Other Exercises: Gave handouts for UE and LE exercise.Pt performed each exercise with PT.     PT Diagnosis:    PT Problem List:   PT Treatment Interventions:     PT Goals (current goals can now be found in the care plan section)    Visit Information  Last PT Received On: 10/04/13 Assistance Needed: +1 History of Present Illness: Presents today with several episodes of palpitations over the past year and recently increased LE edema over several weeks.  Last PM developed SOB and could not lay down and presented to the ER in hypertensive urgency, afib with RVR and acute CHF. Pt reports he is on disabilyt  because of his back pain    Subjective Data  Subjective: "I would like some  exercises to do at home."   Cognition  Cognition Arousal/Alertness: Awake/alert Behavior During Therapy: [facility] for tasks assessed/performed Overall Cognitive Status: Within Functional Limits for tasks  assessed    Balance  Balance Overall balance assessment: Independent  End of Session PT - End of Session Activity Tolerance: Patient tolerated treatment well Patient left: in chair;with call bell/phone within reach;with family/visitor present Nurse Communication: Mobility status   GP     INGOLD,Kenika Sahm 10/04/2013, 4:18 PM [facility] [phone] (pager)

## 2013-10-04 NOTE — Progress Notes (Signed)
UR completed Selicia Windom K. Alaira Level, RN, BSN, MSHL, CCM  10/04/2013 2:11 PM

## 2013-10-04 NOTE — Progress Notes (Addendum)
Made cardiologist aware of patient converting to 1st degree heart block yesterday around 1629, during first shift. MD stated that it was ok and to continue to monitor patient for further changes in condition. EKG obtained.

## 2013-10-04 NOTE — Progress Notes (Signed)
10/04/13 1900-0700 Pt.is A/Ox4 and is ambulatory without assistance. Reviewed pt.'s telemetry strip for the shift and seen that he was now 1st degree heart block/ NSR. Called centralized telemetry monitor  tech to confirm asked when the last time it was that he was a-fib/a-flutter. Central telemetry monitor tech reviewed rhythm strips and seen that the patient heart began to enter NSR/ 1st degree heart block around 1629. Called MD on call Dr.Whitlock and notified him. Pt.'s heart rate was ranging 70-80's NSR, asymptomatic. Order was written to d/c Cardizem drip and restart on po. Pt.had a brief period of a-fib around 0324 according to monitor tech. He is now NSR.

## 2013-10-04 NOTE — Progress Notes (Addendum)
Patient had a 6 beat run of V-tach. Patient was asymptomatic at the time. BP is 119/47 and HR is 79. No verbal complaints and no signs or symptoms of distress. PA notified and made aware. No new orders given. Will continue to monitor patient for further changes in condition.

## 2013-10-04 NOTE — Progress Notes (Addendum)
Subjective:  48 yo with hx of PAF, he was admitted with 1-2 weeks of increased leg edema.Marland Kitchen  He was also found to have AF with RVR.   We have been diuresing him aggressively. - 2.9 liters on 3/9-3/10 -3.5 liters on 3/10-3/11 -1.1 liters on 3/11-3/12  He had an episode of rapid AFlutter  last night.  He was started on Dilt drip and rate is now better.  He converted back to NSR last night.  Objective: Vital signs in last 24 hours: Temp:  [97.6 F (36.4 C)-98 F (36.7 C)] 97.6 F (36.4 C) (03/12 0550) Pulse Rate:  [73-90] 84 (03/12 0550) Resp:  [18-20] 18 (03/12 0550) BP: (98-138)/(52-79) 122/52 mmHg (03/12 0550) SpO2:  [98 %] 98 % (03/12 0550) Weight:  [445 lb 4.8 oz (201.987 kg)] 445 lb 4.8 oz (201.987 kg) (03/12 0550) Last BM Date: 10/04/13  Intake/Output from previous day: 03/11 0701 - 03/12 0700 In: 1187.8 [P.O.:1080; I.V.:107.8] Out: 2300 [Urine:2300] Intake/Output this shift:    Medications Current Facility-Administered Medications  Medication Dose Route Frequency Provider Last Rate Last Dose  . 0.9 %  sodium chloride infusion  250 mL Intravenous PRN Nada Boozer, NP 10 mL/hr at 10/03/13 2322 250 mL at 10/03/13 2322  . aspirin EC tablet 81 mg  81 mg Oral Daily Nada Boozer, NP   81 mg at 10/03/13 0940  . carvedilol (COREG) tablet 25 mg  25 mg Oral BID WC Vesta Mixer, MD   25 mg at 10/04/13 [phone]  . diltiazem (CARDIZEM) tablet 30 mg  30 mg Oral 3 times per day Ardis Rowan, MD   30 mg at 10/04/13 0336  . docusate sodium (COLACE) capsule 100 mg  100 mg Oral BID PRN Brittainy Simmons, PA-C   100 mg at 10/01/13 1442  . furosemide (LASIX) injection 40 mg  40 mg Intravenous 3 times per day Wendall Stade, MD   40 mg at 10/04/13 0631  . glipiZIDE (GLUCOTROL) tablet 10 mg  10 mg Oral BID AC Nada Boozer, NP   10 mg at 10/04/13 0630  . HYDROcodone-acetaminophen (NORCO/VICODIN) 5-325 MG per tablet 1-2 tablet  1-2 tablet Oral Q6H PRN Leeann Must, MD   2 tablet at  10/04/13 [phone]  . insulin aspart (novoLOG) injection 0-15 Units  0-15 Units Subcutaneous TID WC Nada Boozer, NP   3 Units at 10/04/13 [phone]  . insulin aspart (novoLOG) injection 0-5 Units  0-5 Units Subcutaneous QHS Nada Boozer, NP   2 Units at 09/30/13 2340  . nitroGLYCERIN (NITROSTAT) SL tablet 0.4 mg  0.4 mg Sublingual Q5 Min x 3 PRN Roxy Horseman, PA-C   0.4 mg at 09/27/13 1249  . potassium chloride SA (K-DUR,KLOR-CON) CR tablet 20 mEq  20 mEq Oral BID Nada Boozer, NP   20 mEq at 10/03/13 2108  . ramipril (ALTACE) capsule 2.5 mg  2.5 mg Oral BID Nada Boozer, NP   2.5 mg at 10/03/13 2108  . Rivaroxaban (XARELTO) tablet 20 mg  20 mg Oral Q supper Kaylyn Layer, RPH   20 mg at 10/03/13 1605  . sodium chloride 0.9 % injection 3 mL  3 mL Intravenous Q12H Nada Boozer, NP   3 mL at 10/03/13 2110  . sodium chloride 0.9 % injection 3 mL  3 mL Intravenous PRN Nada Boozer, NP      . zolpidem [facility]) tablet 5 mg  5 mg Oral QHS PRN Leeann Must, MD   5 mg at 10/01/13 0018  PE: General appearance: alert, cooperative, no distress and morbidly obese Lungs: mostly clear ( obese , difficult to hear)  Heart: RR,  ( flutter with 4;1)  Extremities: 1-2+ pitting edema Pulses: 2+ and symmetric Skin: warm and dry Neurologic: Grossly normal  Lab Results:  No results found for this basename: WBC, HGB, HCT, PLT,  in the last 72 hours BMET  Recent Labs  10/03/13 0625  NA 140  K 4.4  CL 101  CO2 26  GLUCOSE 171*  BUN 21  CREATININE 1.12  CALCIUM 9.2     Assessment/Plan  Principal Problem:   Acute combined systolic and diastolic CHF, NYHA class 4, difficult to assess Diastolic function on echo Active Problems:   DIABETES MELLITUS, TYPE II, UNCONTROLLED   HYPERTENSION   Obesity, morbid, BMI 50 or higher   Back pain, chronic   Atrial fibrillation with RVR, now rate controlled   Hypertensive urgency   Anticoagulated, on Xarelto, Chads2Vas score 2   1.  Atrial flutter:   Pt converted  to NSR last night. dilt drip is off.  Continue coreg 25 bid, He was started on Dilt 30 TID - given his systlic chf, I would rather avoid dilt if possible. Will DC  2. Chronic systolic CHf: His left ventricular systolic function is mildly depressed with an EF of 45-50%.  He likely has diastolic dysfunction as well but we cannot tell with AFib.  Cont. With lasix.  He still has lot of volume.  He needs to elevated his legs higher than his heart.  Will order compression hose ( TED hose)  He still eats a fair amount of salt.  We had a discussion about correcting this He still has a good output from IV lasix.  We still have excess volume that we need to diurese.  He may benefit from Torsemide instead of lasix.   3. Morbid obesity:  He is morbidly obese.  Body mass index is 57.15 kg/(m^2).  We discussed this and his need to lose weight.   Vesta MixerPhilip J. Kemiah Booz, Montez HagemanJr., MD, [facility] 10/04/2013, 9:32 AM

## 2013-10-04 NOTE — Progress Notes (Signed)
Report given to receiving RN. Patient sitting in the recliner watching TV. No verbal complaints and no signs or symptoms of distress.

## 2013-10-05 DIAGNOSIS — G4733 Obstructive sleep apnea (adult) (pediatric): Secondary | ICD-10-CM

## 2013-10-05 LAB — BASIC METABOLIC PANEL
BUN: 25 mg/dL — ABNORMAL HIGH (ref 6–23)
CO2: 27 mEq/L (ref 19–32)
Calcium: 9.4 mg/dL (ref 8.4–10.5)
Chloride: 100 mEq/L (ref 96–112)
Creatinine, Ser: 1.32 mg/dL (ref 0.50–1.35)
GFR calc Af Amer: 72 mL/min — ABNORMAL LOW (ref >90)
GFR calc non Af Amer: 62 mL/min — ABNORMAL LOW (ref >90)
Glucose, Bld: 159 mg/dL — ABNORMAL HIGH (ref 70–99)
Potassium: 4.4 mEq/L (ref 3.7–5.3)
Sodium: 141 mEq/L (ref 137–147)

## 2013-10-05 LAB — GLUCOSE, CAPILLARY
Glucose-Capillary: 128 mg/dL — ABNORMAL HIGH (ref 70–99)
Glucose-Capillary: 174 mg/dL — ABNORMAL HIGH (ref 70–99)
Glucose-Capillary: 93 mg/dL (ref 70–99)
Glucose-Capillary: 124 mg/dL — ABNORMAL HIGH (ref 70–99)

## 2013-10-05 LAB — MAGNESIUM: Magnesium: 2.2 mg/dL (ref 1.5–2.5)

## 2013-10-05 NOTE — Progress Notes (Signed)
Unable to locate 2XL TED hose.

## 2013-10-05 NOTE — Progress Notes (Signed)
Patient has 11 beats of vtach at 0853.  Pt assymptomatic.  Vital signs stable.  MD notified.  No new orders were made.  Will continue to monitor.

## 2013-10-05 NOTE — Progress Notes (Signed)
Subjective:  48 yo with hx of PAF, he was admitted with 1-2 weeks of increased leg edema.Marland Kitchen  He was also found to have AF with RVR.   We have been diuresing him aggressively. - 2.9 liters on 3/9-3/10  Wt = 449 -3.5 liters on 3/10-3/11 Wt = 444 -1.1 liters on 3/11-3/12 Wt. 445 2.9 liters on 3/12-3/13  Wt. 442  He had an episode of NSVT -  Feeling better.      Objective: Vital signs in last 24 hours: Temp:  [97.1 F (36.2 C)-97.9 F (36.6 C)] 97.2 F (36.2 C) (03/13 0900) Pulse Rate:  [71-92] 78 (03/13 0900) Resp:  [18-20] 20 (03/13 0900) BP: (105-142)/(47-82) 117/59 mmHg (03/13 0900) SpO2:  [96 %-100 %] 97 % (03/13 0900) Weight:  [442 lb 14.4 oz (200.898 kg)] 442 lb 14.4 oz (200.898 kg) (03/13 0509) Last BM Date: 10/04/13  Intake/Output from previous day: 03/12 0701 - 03/13 0700 In: 1066 [P.O.:1060; I.V.:6] Out: 4000 [Urine:4000] Intake/Output this shift: Total I/O In: 240 [P.O.:240] Out: 700 [Urine:700]  Medications Current Facility-Administered Medications  Medication Dose Route Frequency Provider Last Rate Last Dose  . 0.9 %  sodium chloride infusion  250 mL Intravenous PRN Nada Boozer, NP 10 mL/hr at 10/03/13 2322 250 mL at 10/03/13 2322  . aspirin EC tablet 81 mg  81 mg Oral Daily Nada Boozer, NP   81 mg at 10/04/13 1017  . carvedilol (COREG) tablet 37.5 mg  37.5 mg Oral BID WC Vesta Mixer, MD   37.5 mg at 10/05/13 0523  . docusate sodium (COLACE) capsule 100 mg  100 mg Oral BID PRN Brittainy Simmons, PA-C   100 mg at 10/01/13 1442  . furosemide (LASIX) injection 40 mg  40 mg Intravenous 3 times per day Wendall Stade, MD   40 mg at 10/05/13 0528  . glipiZIDE (GLUCOTROL) tablet 10 mg  10 mg Oral BID AC Nada Boozer, NP   10 mg at 10/05/13 0525  . HYDROcodone-acetaminophen (NORCO/VICODIN) 5-325 MG per tablet 1-2 tablet  1-2 tablet Oral Q6H PRN Leeann Must, MD   2 tablet at 10/05/13 0532  . insulin aspart (novoLOG) injection 0-15 Units  0-15 Units  Subcutaneous TID WC Nada Boozer, NP   2 Units at 10/05/13 0749  . insulin aspart (novoLOG) injection 0-5 Units  0-5 Units Subcutaneous QHS Nada Boozer, NP   2 Units at 09/30/13 2340  . nitroGLYCERIN (NITROSTAT) SL tablet 0.4 mg  0.4 mg Sublingual Q5 Min x 3 PRN Roxy Horseman, PA-C   0.4 mg at 09/27/13 1249  . potassium chloride SA (K-DUR,KLOR-CON) CR tablet 20 mEq  20 mEq Oral BID Nada Boozer, NP   20 mEq at 10/04/13 2106  . ramipril (ALTACE) capsule 2.5 mg  2.5 mg Oral BID Nada Boozer, NP   2.5 mg at 10/04/13 2106  . Rivaroxaban (XARELTO) tablet 20 mg  20 mg Oral Q supper Kaylyn Layer, RPH   20 mg at 10/04/13 1654  . sodium chloride 0.9 % injection 3 mL  3 mL Intravenous Q12H Nada Boozer, NP   3 mL at 10/04/13 2107  . sodium chloride 0.9 % injection 3 mL  3 mL Intravenous PRN Nada Boozer, NP   3 mL at 10/05/13 0529  . zolpidem (AMBIEN) tablet 5 mg  5 mg Oral QHS PRN Leeann Must, MD   5 mg at 10/01/13 0018    PE: General appearance: alert, cooperative, no distress and morbidly obese Lungs: mostly clear (  obese , difficult to hear)  Heart: RR,  ( flutter with 4;1)  Extremities: 1-2+ pitting edema Pulses: 2+ and symmetric Skin: warm and dry Neurologic: Grossly normal  Lab Results:  No results found for this basename: WBC, HGB, HCT, PLT,  in the last 72 hours BMET  Recent Labs  10/03/13 0625 10/05/13 0540  NA 140 141  K 4.4 4.4  CL 101 100  CO2 26 27  GLUCOSE 171* 159*  BUN 21 25*  CREATININE 1.12 1.32  CALCIUM 9.2 9.4     Assessment/Plan  Principal Problem:   Acute combined systolic and diastolic CHF, NYHA class 4, difficult to assess Diastolic function on echo Active Problems:   DIABETES MELLITUS, TYPE II, UNCONTROLLED   HYPERTENSION   Obesity, morbid, BMI 50 or higher   Back pain, chronic   Atrial fibrillation with RVR, now rate controlled   Hypertensive urgency   Anticoagulated, on Xarelto, Chads2Vas score 2   1.  Atrial flutter:    Back in NSR  2.  Chronic systolic CHf: His left ventricular systolic function is mildly depressed with an EF of 45-50%.  He likely has diastolic dysfunction as well but we cannot tell with AFib.  Cont. With lasix.  He still has lot of volume.  He needs to elevated his legs higher than his heart.  Will order compression hose ( TED hose)  He still eats a fair amount of salt.  We had a discussion about correcting this He still has a good output from IV lasix. He is getting a good response to the IV lasix.  We still have excess volume that we need to diurese.  He may benefit from Torsemide instead of lasix.   3. Morbid obesity:  He is morbidly obese.  Body mass index is 56.84 kg/(m^2).  4. NS VT:  He has various arrhythmias  - typically at night.  A-flutter earlier this week. NS VTlast night. I think this is due to his "yet to be diagnosed" sleep apnea.  He has an apt. For sleep study.  5. Sleep apnea:  He is scheduled for sleep study.   We discussed this and his need to lose weight.   Vesta MixerPhilip J. Sherie Dobrowolski, Montez HagemanJr., MD, [facility] 10/05/2013, 9:37 AM

## 2013-10-05 NOTE — Progress Notes (Signed)
Patient feels that TED hose are too tight to wear.  Purchasing attempting to find 2XL TED knee high hose for patient.

## 2013-10-05 NOTE — Progress Notes (Signed)
0700-1900 Pt.A/Ox4 and is ambulatory without assistance. He had c/o back pain throughout the shift and prn pain medication was given. Pt.had a 8 beat run of v-tach around 2335. VS taken. Pt.was asymptomatic. MD on call for Dr.Turner was called and notified. New order written for lab draw.

## 2013-10-06 LAB — BASIC METABOLIC PANEL
BUN: 30 mg/dL — ABNORMAL HIGH (ref 6–23)
CO2: 28 mEq/L (ref 19–32)
Calcium: 9.6 mg/dL (ref 8.4–10.5)
Chloride: 100 mEq/L (ref 96–112)
Creatinine, Ser: 1.42 mg/dL — ABNORMAL HIGH (ref 0.50–1.35)
GFR calc Af Amer: 66 mL/min — ABNORMAL LOW (ref >90)
GFR calc non Af Amer: 57 mL/min — ABNORMAL LOW (ref >90)
Glucose, Bld: 131 mg/dL — ABNORMAL HIGH (ref 70–99)
Potassium: 4.8 mEq/L (ref 3.7–5.3)
Sodium: 143 mEq/L (ref 137–147)

## 2013-10-06 LAB — GLUCOSE, CAPILLARY
Glucose-Capillary: 92 mg/dL (ref 70–99)
Glucose-Capillary: 162 mg/dL — ABNORMAL HIGH (ref 70–99)
Glucose-Capillary: 145 mg/dL — ABNORMAL HIGH (ref 70–99)
Glucose-Capillary: 134 mg/dL — ABNORMAL HIGH (ref 70–99)

## 2013-10-06 NOTE — Progress Notes (Signed)
DAILY PROGRESS NOTE  Subjective:  Breathing is improved. SCr continues to rise.   Objective:  Temp:  [97.7 F (36.5 C)-98 F (36.7 C)] 98 F (36.7 C) (03/14 0540) Pulse Rate:  [72-83] 83 (03/14 0540) Resp:  [20] 20 (03/14 0540) BP: (105-137)/(59-86) 105/73 mmHg (03/14 1035) SpO2:  [99 %-100 %] 100 % (03/14 0540) Weight:  [440 lb 8 oz (199.809 kg)] 440 lb 8 oz (199.809 kg) (03/14 0540) Weight change: -2 lb 6.4 oz (-1.089 kg)  Intake/Output from previous day: 03/13 0701 - 03/14 0700 In: 2778 [P.O.:1440; I.V.:6] Out: 2423 [Urine:3475]  Intake/Output from this shift: Total I/O In: 243 [P.O.:240; I.V.:3] Out: 625 [Urine:625]  Medications: Current Facility-Administered Medications  Medication Dose Route Frequency Provider Last Rate Last Dose  . 0.9 %  sodium chloride infusion  250 mL Intravenous PRN Cecilie Kicks, NP 10 mL/hr at 10/03/13 2322 250 mL at 10/03/13 2322  . aspirin EC tablet 81 mg  81 mg Oral Daily Cecilie Kicks, NP   81 mg at 10/06/13 1034  . carvedilol (COREG) tablet 37.5 mg  37.5 mg Oral BID WC Thayer Headings, MD   37.5 mg at 10/06/13 5361  . docusate sodium (COLACE) capsule 100 mg  100 mg Oral BID PRN Brittainy Simmons, PA-C   100 mg at 10/01/13 1442  . furosemide (LASIX) injection 40 mg  40 mg Intravenous 3 times per day Josue Hector, MD   40 mg at 10/06/13 4431  . glipiZIDE (GLUCOTROL) tablet 10 mg  10 mg Oral BID AC Cecilie Kicks, NP   10 mg at 10/06/13 5400  . HYDROcodone-acetaminophen (NORCO/VICODIN) 5-325 MG per tablet 1-2 tablet  1-2 tablet Oral Q6H PRN Jules Husbands, MD   2 tablet at 10/06/13 0813  . insulin aspart (novoLOG) injection 0-15 Units  0-15 Units Subcutaneous TID WC Cecilie Kicks, NP   3 Units at 10/05/13 1146  . insulin aspart (novoLOG) injection 0-5 Units  0-5 Units Subcutaneous QHS Cecilie Kicks, NP   2 Units at 09/30/13 2340  . nitroGLYCERIN (NITROSTAT) SL tablet 0.4 mg  0.4 mg Sublingual Q5 Min x 3 PRN Montine Circle, PA-C   0.4 mg at  09/27/13 1249  . potassium chloride SA (K-DUR,KLOR-CON) CR tablet 20 mEq  20 mEq Oral BID Cecilie Kicks, NP   20 mEq at 10/06/13 1040  . ramipril (ALTACE) capsule 2.5 mg  2.5 mg Oral BID Cecilie Kicks, NP   2.5 mg at 10/06/13 1035  . Rivaroxaban (XARELTO) tablet 20 mg  20 mg Oral Q supper Rikki Spearing, RPH   20 mg at 10/05/13 1708  . sodium chloride 0.9 % injection 3 mL  3 mL Intravenous Q12H Cecilie Kicks, NP   3 mL at 10/06/13 1040  . sodium chloride 0.9 % injection 3 mL  3 mL Intravenous PRN Cecilie Kicks, NP   3 mL at 10/05/13 0529  . zolpidem (AMBIEN) tablet 5 mg  5 mg Oral QHS PRN Jules Husbands, MD   5 mg at 10/01/13 0018    Physical Exam: General appearance: alert, no distress and morbidly obese Lungs: clear to auscultation bilaterally Heart: regular rate and rhythm Abdomen: morbidly obese, protuberant Extremities: edema 1-2+ LE edema Skin: Skin color, texture, turgor normal. No rashes or lesions  Lab Results: Results for orders placed during the hospital encounter of 09/27/13 (from the past 48 hour(s))  GLUCOSE, CAPILLARY     Status: Abnormal   Collection Time    10/04/13 11:19 AM  Result Value Ref Range   Glucose-Capillary 139 (*) 70 - 99 mg/dL  GLUCOSE, CAPILLARY     Status: Abnormal   Collection Time    10/04/13  4:06 PM      Result Value Ref Range   Glucose-Capillary 158 (*) 70 - 99 mg/dL  GLUCOSE, CAPILLARY     Status: Abnormal   Collection Time    10/04/13  8:54 PM      Result Value Ref Range   Glucose-Capillary 112 (*) 70 - 99 mg/dL   Comment 1 Notify RN     Comment 2 Documented in Chart    BASIC METABOLIC PANEL     Status: Abnormal   Collection Time    10/05/13  5:40 AM      Result Value Ref Range   Sodium 141  137 - 147 mEq/L   Potassium 4.4  3.7 - 5.3 mEq/L   Chloride 100  96 - 112 mEq/L   CO2 27  19 - 32 mEq/L   Glucose, Bld 159 (*) 70 - 99 mg/dL   BUN 25 (*) 6 - 23 mg/dL   Creatinine, Ser 1.32  0.50 - 1.35 mg/dL   Calcium 9.4  8.4 - 10.5 mg/dL   GFR  calc non Af Amer 62 (*) >90 mL/min   GFR calc Af Amer 72 (*) >90 mL/min   Comment: (NOTE)     The eGFR has been calculated using the CKD EPI equation.     This calculation has not been validated in all clinical situations.     eGFR's persistently <90 mL/min signify possible Chronic Kidney     Disease.  MAGNESIUM     Status: None   Collection Time    10/05/13  5:40 AM      Result Value Ref Range   Magnesium 2.2  1.5 - 2.5 mg/dL  GLUCOSE, CAPILLARY     Status: Abnormal   Collection Time    10/05/13  7:43 AM      Result Value Ref Range   Glucose-Capillary 128 (*) 70 - 99 mg/dL   Comment 1 Documented in Chart     Comment 2 Notify RN    GLUCOSE, CAPILLARY     Status: Abnormal   Collection Time    10/05/13 11:16 AM      Result Value Ref Range   Glucose-Capillary 174 (*) 70 - 99 mg/dL  GLUCOSE, CAPILLARY     Status: None   Collection Time    10/05/13  4:27 PM      Result Value Ref Range   Glucose-Capillary 93  70 - 99 mg/dL  GLUCOSE, CAPILLARY     Status: Abnormal   Collection Time    10/05/13  9:12 PM      Result Value Ref Range   Glucose-Capillary 124 (*) 70 - 99 mg/dL   Comment 1 Notify RN    BASIC METABOLIC PANEL     Status: Abnormal   Collection Time    10/06/13  5:41 AM      Result Value Ref Range   Sodium 143  137 - 147 mEq/L   Potassium 4.8  3.7 - 5.3 mEq/L   Chloride 100  96 - 112 mEq/L   CO2 28  19 - 32 mEq/L   Glucose, Bld 131 (*) 70 - 99 mg/dL   BUN 30 (*) 6 - 23 mg/dL   Creatinine, Ser 1.42 (*) 0.50 - 1.35 mg/dL   Calcium 9.6  8.4 - 10.5 mg/dL  GFR calc non Af Amer 57 (*) >90 mL/min   GFR calc Af Amer 66 (*) >90 mL/min   Comment: (NOTE)     The eGFR has been calculated using the CKD EPI equation.     This calculation has not been validated in all clinical situations.     eGFR's persistently <90 mL/min signify possible Chronic Kidney     Disease.  GLUCOSE, CAPILLARY     Status: None   Collection Time    10/06/13  6:07 AM      Result Value Ref Range    Glucose-Capillary 92  70 - 99 mg/dL    Imaging: No results found.  Assessment:  1. Principal Problem: 2.   Acute combined systolic and diastolic CHF, NYHA class 4, difficult to assess Diastolic function on echo 3. Active Problems: 4.   DIABETES MELLITUS, TYPE II, UNCONTROLLED 5.   HYPERTENSION 6.   Obesity, morbid, BMI 50 or higher 7.   Back pain, chronic 8.   Atrial fibrillation with RVR, now rate controlled 9.   Hypertensive urgency 10.   Anticoagulated, on Xarelto, Chads2Vas score 2 11.   Plan:  1. Hold additional lasix for now as he may be near diuretic endpoint. Re-assess creatinine in am tomorrow and may switch to po lasix. Ok to continue low dose Altace.  Time Spent Directly with Patient:  15 minutes  Length of Stay:  LOS: 9 days   Pixie Casino, MD, [facility] Attending Cardiologist [facility]  HILTY,Kenneth C 10/06/2013, 10:41 AM

## 2013-10-07 LAB — BASIC METABOLIC PANEL
BUN: 34 mg/dL — ABNORMAL HIGH (ref 6–23)
CO2: 28 mEq/L (ref 19–32)
Calcium: 9.2 mg/dL (ref 8.4–10.5)
Chloride: 99 mEq/L (ref 96–112)
Creatinine, Ser: 1.3 mg/dL (ref 0.50–1.35)
GFR calc Af Amer: 73 mL/min — ABNORMAL LOW (ref >90)
GFR calc non Af Amer: 63 mL/min — ABNORMAL LOW (ref >90)
Glucose, Bld: 190 mg/dL — ABNORMAL HIGH (ref 70–99)
Potassium: 4.5 mEq/L (ref 3.7–5.3)
Sodium: 141 mEq/L (ref 137–147)

## 2013-10-07 LAB — GLUCOSE, CAPILLARY
Glucose-Capillary: 141 mg/dL — ABNORMAL HIGH (ref 70–99)
Glucose-Capillary: 207 mg/dL — ABNORMAL HIGH (ref 70–99)
Glucose-Capillary: 113 mg/dL — ABNORMAL HIGH (ref 70–99)

## 2013-10-07 LAB — PRO B NATRIURETIC PEPTIDE: Pro B Natriuretic peptide (BNP): 93.2 pg/mL (ref 0–125)

## 2013-10-07 MED ORDER — ASPIRIN 81 MG PO TBEC: 81.0000 mg | DELAYED_RELEASE_TABLET | Freq: Every day | ORAL | Status: DC

## 2013-10-07 MED ORDER — POTASSIUM CHLORIDE CRYS ER 20 MEQ PO TBCR: 20.0000 meq | EXTENDED_RELEASE_TABLET | Freq: Two times a day (BID) | ORAL | Status: DC

## 2013-10-07 MED ORDER — FUROSEMIDE 40 MG PO TABS
60.0000 mg | ORAL_TABLET | Freq: Every day | ORAL | Status: DC
  Administered 2013-10-07: 60 mg via ORAL
  Filled 2013-10-07: qty 1

## 2013-10-07 MED ORDER — NITROGLYCERIN 0.4 MG SL SUBL: 0.4000 mg | SUBLINGUAL_TABLET | SUBLINGUAL | Status: DC | PRN

## 2013-10-07 MED ORDER — HYDROCODONE-ACETAMINOPHEN 5-325 MG PO TABS: 1.0000 | ORAL_TABLET | Freq: Four times a day (QID) | ORAL | Status: DC | PRN

## 2013-10-07 MED ORDER — RAMIPRIL 2.5 MG PO CAPS: 2.5000 mg | ORAL_CAPSULE | Freq: Two times a day (BID) | ORAL | Status: DC

## 2013-10-07 MED ORDER — FUROSEMIDE 20 MG PO TABS: 60.0000 mg | ORAL_TABLET | Freq: Every day | ORAL | Status: DC

## 2013-10-07 MED ORDER — CARVEDILOL 12.5 MG PO TABS: 37.5000 mg | ORAL_TABLET | Freq: Two times a day (BID) | ORAL | Status: DC

## 2013-10-07 MED ORDER — RIVAROXABAN 20 MG PO TABS: 20.0000 mg | ORAL_TABLET | Freq: Every day | ORAL | Status: DC

## 2013-10-07 MED ORDER — DSS 100 MG PO CAPS: 100.0000 mg | ORAL_CAPSULE | Freq: Two times a day (BID) | ORAL | Status: DC | PRN

## 2013-10-07 NOTE — Progress Notes (Signed)
Patient discharged to home with girlfriend.  IV removed prior to discharge; IV site clean, dry, and intact.  Discharge instructions, education, and medications discussed with patient and girlfriend.  Physical prescriptions provided to patient along with discharge packet.  Legs wrapped with Ace wraps at Dr. Blanchie Dessert order as patient's insurance will not pay for compression stockings.  Patient's girlfriend instructed as to how to wrap legs using teach back method.  Patient and girlfriend deny any questions or concerns at this time.

## 2013-10-07 NOTE — Progress Notes (Signed)
CARE MANAGEMENT NOTE 10/07/2013  Patient:  Maurice Nguyen,Maurice Nguyen   Account Number:  [id number]  Date Initiated:  09/28/2013  Documentation initiated by:  [facility]  Subjective/Objective Assessment:   Principal Problem: Atrial fibrillation with RVRActive Problems:DIABETES MELLITUS, TYPE II; UNCONTROLLED    HTN, Obesity, morbid, BMI 50 or higher    Back pain, CHF/ Home alone     Action/Plan:   Cardizem IV//Offer HH   Anticipated DC Date:  10/02/2013   Anticipated DC Plan:  HOME W HOME HEALTH SERVICES      DC Planning Services  CM consult  GCCN / P4HM (established/new)      Choice offered to / List presented to:     DME arranged  HOSPITAL BED      DME agency  [facility]     [facility]  HH-1 RN  HH-10 DISEASE MANAGEMENT  HH-6 SOCIAL WORKER      HH agency  [facility]   Status of service:  Completed, signed off Medicare Important Message given?   (If response is "NO", the following Medicare IM given date fields will be blank) Date Medicare IM given:   Date Additional Medicare IM given:    Discharge Disposition:  HOME W HOME HEALTH SERVICES  Per UR Regulation:  Reviewed for med. necessity/level of care/duration of stay  If discussed at Long Length of Stay Meetings, dates discussed:   10/02/2013  10/04/2013    Comments:  10/07/13 13:40 CM texted liason [facility]orthy [facility]Winnie [facility].  [facility]  No other CM needs were communicated.  Freddy JakschSarah Zylah Elsbernd, BSN, Caryl AdaM [phone].  10/03/2013 event of rapid Nguyen-Flutter and started on Cardizem gtt; continues with IV Lasix tid. Disposition Pending medical stability.  (See note below for dispositon plans) Crystal Hutchinson RN, BSN, MSHL, CCM 10/03/2013  10/02/2013 Social:  From home with wife/[name]. Patient interested in [employer] program, recliner for home, scales for over 450 lbs. CM completed research as follows:  Silver Sneakers: Patient has MCR/MCD and is inelligible  for [employer] / [employer] option. CM contacted [employer] and printed on line application (Open Doors Application).  CM instructed to complete and take to [facility] for submission for eligibilty for program financial assistance.  Printed copy with information provided to patient.  Recliner MD order needed if portion of motor filed with provider. Provider will only cover Nguyen portion of the motor [facility] out and store will order. Must private pay for the chair Cost will be between $700-800 dollars estimated.  Cost is determined by the chair chosen CM provided VE on importance of not becoming dependent on chair and getting up and down out of the chair is meeting daily therapy goals.  CM discussed patients interest in Silver Sneakers and defeats therapy goals.  CM encouraged to stay active as much as possible to meed activity goals. Printed copy with information provided to patient.  Scales: [email or web address].target.com/p/taylor-550-pound-projection-[email or web address]/-/ [email or web address] [facility] 550 Pound Projection Scale - $44.99 CM advised to order either on line or go to [employer] and order. Printed copy with information provided to patient.  Disposition Plan: HHS: RN: Disease MGMT, NEW CHF - Telehealth (will only go up to 450 lbs) - [facility] notified) DME: Bariatric Bed  (to be ordered with [facility] closer to d/c) P4CC - Active [facility] - inelligible d/t PCP: Dr. Bruna PotterBlount out of network and not willing to change PCPs Cardiology f/u: ___________pending ADD:  3 (Remains on  IV Lasix  40mg  q 8 hours  Wt 449/460. Crystal Hutchinson RN, BSN, MSHL, CCM 10/02/2013 4:13pm  10/01/2013 IV Lasix PT Recs:  Pending Patient requesting Hospital Bed, Recliner and scale at d/c [facility] 452) Silver Sneakers/[employer] P4CC - Active [facility] - inelligible d/t PCP: Dr. Bruna Potter out of network and not willing to change PCPs Dispositon Plan: HHS:  RN (New CHF) Crystal Hutchinson RN, BSN, MSHL, CCM 10/01/2013  09/28/13 1630 Camellia Lucretia Roers, RN, BSN, [employer]  [phone] Spoke with pt at bedside concerning discharge planning.  Pt inquiring about scales, hospital bed and/or recliiner for home, and silver sneaker [employer]  With pt having Meicare and Medicaid, pt will not qualify for [facility] scale [facility] scale does not handle his weight).  NCM will work on hospital bed for home and reach out to [facility] and [facility] for further community services.

## 2013-10-07 NOTE — Progress Notes (Addendum)
DAILY PROGRESS NOTE  Subjective:  Creatinine has improved overnight back to 1.3.   Objective:  Temp:  [97.6 F (36.4 C)-98.2 F (36.8 C)] 97.9 F (36.6 C) (03/15 0427) Pulse Rate:  [67-87] 67 (03/15 1000) Resp:  [18-20] 18 (03/15 1000) BP: (105-136)/(55-73) 111/55 mmHg (03/15 1000) SpO2:  [97 %-99 %] 97 % (03/15 1000) Weight:  [444 lb 11.2 oz (201.715 kg)] 444 lb 11.2 oz (201.715 kg) (03/15 0427) Weight change: 4 lb 3.2 oz (1.905 kg)  Intake/Output from previous day: 03/14 0701 - 03/15 0700 In: 944 [P.O.:840; I.V.:3] Out: 1900 [Urine:1900]  Intake/Output from this shift: Total I/O In: 243 [P.O.:240; I.V.:3] Out: -   Medications: Current Facility-Administered Medications  Medication Dose Route Frequency Provider Last Rate Last Dose  . 0.9 %  sodium chloride infusion  250 mL Intravenous PRN Cecilie Kicks, NP 10 mL/hr at 10/03/13 2322 250 mL at 10/03/13 2322  . aspirin EC tablet 81 mg  81 mg Oral Daily Cecilie Kicks, NP   81 mg at 10/07/13 1001  . carvedilol (COREG) tablet 37.5 mg  37.5 mg Oral BID WC Thayer Headings, MD   37.5 mg at 10/07/13 0604  . docusate sodium (COLACE) capsule 100 mg  100 mg Oral BID PRN Brittainy Simmons, PA-C   100 mg at 10/01/13 1442  . glipiZIDE (GLUCOTROL) tablet 10 mg  10 mg Oral BID AC Cecilie Kicks, NP   10 mg at 10/07/13 0604  . HYDROcodone-acetaminophen (NORCO/VICODIN) 5-325 MG per tablet 1-2 tablet  1-2 tablet Oral Q6H PRN Jules Husbands, MD   2 tablet at 10/07/13 0503  . insulin aspart (novoLOG) injection 0-15 Units  0-15 Units Subcutaneous TID WC Cecilie Kicks, NP   3 Units at 10/07/13 0735  . insulin aspart (novoLOG) injection 0-5 Units  0-5 Units Subcutaneous QHS Cecilie Kicks, NP   2 Units at 09/30/13 2340  . nitroGLYCERIN (NITROSTAT) SL tablet 0.4 mg  0.4 mg Sublingual Q5 Min x 3 PRN Montine Circle, PA-C   0.4 mg at 09/27/13 1249  . potassium chloride SA (K-DUR,KLOR-CON) CR tablet 20 mEq  20 mEq Oral BID Cecilie Kicks, NP   20 mEq at  10/07/13 1001  . ramipril (ALTACE) capsule 2.5 mg  2.5 mg Oral BID Cecilie Kicks, NP   2.5 mg at 10/07/13 1001  . Rivaroxaban (XARELTO) tablet 20 mg  20 mg Oral Q supper Rikki Spearing, RPH   20 mg at 10/06/13 1809  . sodium chloride 0.9 % injection 3 mL  3 mL Intravenous Q12H Cecilie Kicks, NP   3 mL at 10/07/13 1001  . sodium chloride 0.9 % injection 3 mL  3 mL Intravenous PRN Cecilie Kicks, NP   3 mL at 10/05/13 0529  . zolpidem (AMBIEN) tablet 5 mg  5 mg Oral QHS PRN Jules Husbands, MD   5 mg at 10/01/13 0018    Physical Exam: General appearance: alert, no distress and morbidly obese Lungs: clear to auscultation bilaterally Heart: regular rate and rhythm Abdomen: morbidly obese, protuberant Extremities: edema 1-2+ LE edema Skin: Skin color, texture, turgor normal. No rashes or lesions  Lab Results: Results for orders placed during the hospital encounter of 09/27/13 (from the past 48 hour(s))  GLUCOSE, CAPILLARY     Status: Abnormal   Collection Time    10/05/13 11:16 AM      Result Value Ref Range   Glucose-Capillary 174 (*) 70 - 99 mg/dL  GLUCOSE, CAPILLARY     Status:  None   Collection Time    10/05/13  4:27 PM      Result Value Ref Range   Glucose-Capillary 93  70 - 99 mg/dL  GLUCOSE, CAPILLARY     Status: Abnormal   Collection Time    10/05/13  9:12 PM      Result Value Ref Range   Glucose-Capillary 124 (*) 70 - 99 mg/dL   Comment 1 Notify RN    BASIC METABOLIC PANEL     Status: Abnormal   Collection Time    10/06/13  5:41 AM      Result Value Ref Range   Sodium 143  137 - 147 mEq/L   Potassium 4.8  3.7 - 5.3 mEq/L   Chloride 100  96 - 112 mEq/L   CO2 28  19 - 32 mEq/L   Glucose, Bld 131 (*) 70 - 99 mg/dL   BUN 30 (*) 6 - 23 mg/dL   Creatinine, Ser 1.42 (*) 0.50 - 1.35 mg/dL   Calcium 9.6  8.4 - 10.5 mg/dL   GFR calc non Af Amer 57 (*) >90 mL/min   GFR calc Af Amer 66 (*) >90 mL/min   Comment: (NOTE)     The eGFR has been calculated using the CKD EPI equation.      This calculation has not been validated in all clinical situations.     eGFR's persistently <90 mL/min signify possible Chronic Kidney     Disease.  GLUCOSE, CAPILLARY     Status: None   Collection Time    10/06/13  6:07 AM      Result Value Ref Range   Glucose-Capillary 92  70 - 99 mg/dL  GLUCOSE, CAPILLARY     Status: Abnormal   Collection Time    10/06/13 11:08 AM      Result Value Ref Range   Glucose-Capillary 162 (*) 70 - 99 mg/dL  GLUCOSE, CAPILLARY     Status: Abnormal   Collection Time    10/06/13  4:04 PM      Result Value Ref Range   Glucose-Capillary 145 (*) 70 - 99 mg/dL   Comment 1 Notify RN    GLUCOSE, CAPILLARY     Status: Abnormal   Collection Time    10/06/13  8:54 PM      Result Value Ref Range   Glucose-Capillary 134 (*) 70 - 99 mg/dL   Comment 1 Notify RN     Comment 2 Documented in Chart    BASIC METABOLIC PANEL     Status: Abnormal   Collection Time    10/07/13  4:11 AM      Result Value Ref Range   Sodium 141  137 - 147 mEq/L   Potassium 4.5  3.7 - 5.3 mEq/L   Chloride 99  96 - 112 mEq/L   CO2 28  19 - 32 mEq/L   Glucose, Bld 190 (*) 70 - 99 mg/dL   BUN 34 (*) 6 - 23 mg/dL   Creatinine, Ser 1.30  0.50 - 1.35 mg/dL   Calcium 9.2  8.4 - 10.5 mg/dL   GFR calc non Af Amer 63 (*) >90 mL/min   GFR calc Af Amer 73 (*) >90 mL/min   Comment: (NOTE)     The eGFR has been calculated using the CKD EPI equation.     This calculation has not been validated in all clinical situations.     eGFR's persistently <90 mL/min signify possible Chronic Kidney  Disease.  PRO B NATRIURETIC PEPTIDE     Status: None   Collection Time    10/07/13  4:11 AM      Result Value Ref Range   Pro B Natriuretic peptide (BNP) 93.2  0 - 125 pg/mL  GLUCOSE, CAPILLARY     Status: Abnormal   Collection Time    10/07/13  6:43 AM      Result Value Ref Range   Glucose-Capillary 141 (*) 70 - 99 mg/dL    Imaging: No results found.  Assessment:  Principal Problem:   Acute  combined systolic and diastolic CHF, NYHA class 4, difficult to assess Diastolic function on echo Active Problems:   DIABETES MELLITUS, TYPE II, UNCONTROLLED   HYPERTENSION   Obesity, morbid, BMI 50 or higher   Back pain, chronic   Atrial fibrillation with RVR, now rate controlled   Hypertensive urgency   Anticoagulated, on Xarelto, Chads2Vas score 2   Plan:  1. Creatinine improved. Ok to switch to po lasix 60 mg daily. Maintaining sinus on xarelto. Will Rx for bilateral LE 20-30 mmHg compression stockings. Outpatient sleep study. He can be discharged home today. He is requesting pain medications at discharge. Follow-up with Dr. Cathie Olden.  Time Spent Directly with Patient:  15 minutes  Length of Stay:  LOS: 10 days   Pixie Casino, MD, [facility] Attending Cardiologist [facility]  HILTY,Kenneth C 10/07/2013, 10:17 AM

## 2013-10-07 NOTE — Discharge Summary (Signed)
Physician Discharge Summary  Patient ID: Maurice Nguyen. MRN: [id number] DOB/AGE: [birth year] 48 y.o.  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 Atrial fibrillation-On anticoagulation  Secondary Discharge Diagnosis 1. Diabetes 2. Hypertension 3. Obesity 4.Chronic Back Pain  Significant Diagnostic Studies: 1.Echocardiogram 09/28/2013 Procedure narrative: Transthoracic echocardiography. The study was technically difficult, as a result of poor acoustic windows, poor sound wave transmission, and body habitus. Intravenous contrast (Definity) was administered. - Left ventricle: The cavity size was normal. There was moderate concentric hypertrophy. Systolic function was mildly reduced. The estimated ejection fraction was in the range of 45% to 50%. Definity contrast was administered which did not aid in wall motion or EF determination. The study is not technically sufficient to allow evaluation of LV diastolic function. - Mitral valve: Poorly visualized. Mild regurgitation. - Left atrium: The atrium was mildly dilated. - Inferior vena cava: The vessel was dilated; the respirophasic diameter changes were blunted (< 50%); findings are consistent with elevated central venous pressure. - Pericardium, extracardiac: There was no pericardial effusion.   Hospital Course:    Mr. Sedlacek is a 48 year old patient of Dr. Melburn Popper, with history of paroxysmal atrial fibrillation, mixed CHF, with systolic dysfunction, hypertension, and obesity, who was admitted 10 days ago for acute on chronic pulmonary edema with lower extremity edema dyspnea on exertion and atrial fibrillation with RVR. The patient is medically noncompliant concerning his Lasix and has run out several weeks prior to admission. "I do not like to take medicines".    On arrival to the right ear the  patient was hypertensive with a blood pressure 156/99 heart rate of 120 A. fib RVR with O2 sat 98%. He is temperature was 99.0. Chest x-ray revealed generalized cardiomegaly with no apparent edema or consolidation at that time. The patient was treated with Lasix 40 mg IV, started on IV diltiazem drip after 50 mg bolus, and continued on 10 mg an hour. He diuresed well throughout hospitalization, but required lengthy stay for full diuresis to baseline weight.    Atrial fibrillation was difficult to control despite IV diltiazem. He was increased on his dose of carvedilol from 6.25 mg twice a day to  37.5 mg twice a day. His Lasix was increased to 60 mg daily. He continued to have bilateral lower extremity pain. Chronic back pain this was felt to be related. He was seen by physical therapy. His gait was limited secondary to swelling in his lower extremities, and chronic back pain. He was advised to walk in the hallway as much as possible, without any further physical therapy recommendations. This measures saw him and he was recommended for Silver Sneakers walking program provided to the [facility].    After lengthy diuresis, the patient lost 16 pounds, with an admission weight of 460 pounds discharge weight of 444 pounds. He was seen by Dr. Rennis Golden on discharge and found to be stable. Prescription for compression as were provided for lower extremity edema and for venous support. He is to follow with Dr. Laureen Abrahams as an outpatient for ongoing assessment and management. He has been advised on a low sodium diabetic diet, and to weigh himself daily.      Discharge Exam: Blood pressure 111/55, pulse 67, temperature 97.9 F (36.6 C), temperature source Oral, resp. rate 18, height 6\' 2"  (1.88 m), weight 444 lb 11.2 oz (201.715 kg), SpO2 97.00%.  Labs:   Lab Results  Component Value Date   WBC 8.1 09/27/2013   HGB 13.0 09/27/2013   HCT 38.5* 09/27/2013   MCV 93.4 09/27/2013   PLT 245 09/27/2013    Recent Labs Lab  10/07/13 0411  NA 141  K 4.5  CL 99  CO2 28  BUN 34*  CREATININE 1.30  CALCIUM 9.2  GLUCOSE 190*   Lab Results  Component Value Date   CKTOTAL 72 03/29/2009   CKMB 2.3 03/29/2009   TROPONINI <0.30 09/28/2013    Lab Results  Component Value Date   CHOL  Value: 159        ATP III CLASSIFICATION:  <200     mg/dL   Desirable  810-175  mg/dL   Borderline High  >=102    mg/dL   High        5/85/2778   CHOL  Value: 131        ATP III CLASSIFICATION:  <200     mg/dL   Desirable  242-353  mg/dL   Borderline High  >=614    mg/dL   High        10/27/1538   Lab Results  Component Value Date   HDL 59 02/11/2010   HDL 49 0/02/6760   Lab Results  Component Value Date   LDLCALC  Value: 88        Total Cholesterol/HDL:CHD Risk Coronary Heart Disease Risk Table                     Men   Women  1/2 Average Risk   3.4   3.3  Average Risk       5.0   4.4  2 X Average Risk   9.6   7.1  3 X Average Risk  23.4   11.0        Use the calculated Patient Ratio above and the CHD Risk Table to determine the patient's CHD Risk.        ATP III CLASSIFICATION (LDL):  <100     mg/dL   Optimal  950-932  mg/dL   Near or Above                    Optimal  130-159  mg/dL   Borderline  671-245  mg/dL   High  >809     mg/dL   Very High 9/83/3825   LDLCALC  Value: 72        Total Cholesterol/HDL:CHD Risk Coronary Heart Disease Risk Table                     Men   Women  1/2 Average Risk   3.4   3.3  Average Risk       5.0   4.4  2 X Average Risk   9.6   7.1  3 X Average Risk  23.4   11.0        Use the calculated Patient Ratio above and the CHD Risk Table to determine the patient's CHD Risk.        ATP III CLASSIFICATION (LDL):  <100     mg/dL   Optimal  053-976  mg/dL   Near or Above                    Optimal  130-159  mg/dL   Borderline  734-193  mg/dL   High  >790     mg/dL  Very High 03/28/2009   Lab Results  Component Value Date   TRIG 58 02/11/2010   TRIG 49 03/28/2009   Lab Results  Component Value Date   CHOLHDL 2.7  02/11/2010   CHOLHDL 2.7 03/28/2009   No results found for this basename: LDLDIRECT      Radiology: Dg Chest Portable 1 View  09/27/2013   CLINICAL DATA:  Shortness of Breath  EXAM: PORTABLE CHEST - 1 VIEW  COMPARISON:  August 22, 2012  FINDINGS: Lungs are clear. There is moderate cardiomegaly with normal pulmonary vascularity. No adenopathy. No bone lesions.  IMPRESSION: Generalized cardiomegaly, a finding also present previously. No apparent edema or consolidation.   Electronically Signed   By: Bretta Bang M.D.   On: 09/27/2013 12:17     FOLLOW UP PLANS AND APPOINTMENTS Discharge Orders   Future Orders Complete By Expires   ACE Inhibitor / ARB already ordered  As directed    Beta Blocker already ordered  As directed    Diet - low sodium heart healthy  As directed    Increase activity slowly  As directed        Medication List    STOP taking these medications       naproxen sodium 220 MG tablet  Commonly known as:  ANAPROX      TAKE these medications       aspirin 81 MG EC tablet  Take 1 tablet (81 mg total) by mouth daily.     carvedilol 12.5 MG tablet  Commonly known as:  COREG  Take 3 tablets (37.5 mg total) by mouth 2 (two) times daily with a meal.     diltiazem 240 MG 24 hr capsule  Commonly known as:  CARDIZEM CD  Take 1 capsule (240 mg total) by mouth daily.     DSS 100 MG Caps  Take 100 mg by mouth 2 (two) times daily as needed for mild constipation.     furosemide 20 MG tablet  Commonly known as:  LASIX  Take 3 tablets (60 mg total) by mouth daily.     glipiZIDE 10 MG tablet  Commonly known as:  GLUCOTROL  Take 10 mg by mouth 2 (two) times daily before a meal.     HYDROcodone-acetaminophen 5-325 MG per tablet  Commonly known as:  NORCO/VICODIN  Take 1-2 tablets by mouth every 6 (six) hours as needed for moderate pain.     metFORMIN 1000 MG tablet  Commonly known as:  GLUCOPHAGE  Take 1,000 mg by mouth 2 (two) times daily with a meal.      nitroGLYCERIN 0.4 MG SL tablet  Commonly known as:  NITROSTAT  Place 1 tablet (0.4 mg total) under the tongue every 5 (five) minutes x 3 doses as needed for chest pain.     potassium chloride SA 20 MEQ tablet  Commonly known as:  K-DUR,KLOR-CON  Take 1 tablet (20 mEq total) by mouth 2 (two) times daily.     ramipril 2.5 MG capsule  Commonly known as:  ALTACE  Take 1 capsule (2.5 mg total) by mouth 2 (two) times daily.     Rivaroxaban 20 MG Tabs tablet  Commonly known as:  XARELTO  Take 1 tablet (20 mg total) by mouth daily with supper.           Follow-up Information   Follow up with [facility].   Contact information:   [address] [place] [address] [phone]  Follow up with Elyn AquasPhilip Nahser Jr., MD. (Our office will call you for appt.)    Specialty:  Cardiology   Contact information:   [address] [place] [address] [phone]         Time spent with patient to include physician time: 35 mintues Signed: Joni ReiningKathryn Calee Nugent 10/07/2013, 11:49 AM Co-Sign MD

## 2013-10-08 ENCOUNTER — Telehealth: Payer: Self-pay | Admitting: Adult Health

## 2013-10-08 NOTE — Progress Notes (Signed)
Utilization Review - Retro  Completed  Stefhanie Kachmar K. Trinetta Alemu, RN, BSN, MSHL, CCM  10/08/2013 11:40 AM

## 2013-10-08 NOTE — Telephone Encounter (Signed)
I did not write for Norco. Will need to talk to PCP about pain control,

## 2013-10-08 NOTE — Telephone Encounter (Signed)
Delice Bison (Pharmacist w/ [facility]) called regarding RX for Norco.  States that it was prescribed on 10/07/13 for patient.  It is Class 2 narcotic and pharmacist states that your DEA does not allow for you to prescribe these.  States that you could do Tylenol 3 or Tramadol.  Please call Delice Bison (Pharmacist) at above number. / tgs

## 2013-10-29 ENCOUNTER — Ambulatory Visit (INDEPENDENT_AMBULATORY_CARE_PROVIDER_SITE_OTHER): Payer: Medicare Other | Admitting: Nurse Practitioner

## 2013-10-29 ENCOUNTER — Encounter: Payer: Self-pay | Admitting: Nurse Practitioner

## 2013-10-29 ENCOUNTER — Telehealth: Payer: Self-pay | Admitting: Cardiovascular Disease

## 2013-10-29 VITALS — BP 130/70 | HR 80 | Ht 73.0 in | Wt >= 6400 oz

## 2013-10-29 DIAGNOSIS — I5022 Chronic systolic (congestive) heart failure: Secondary | ICD-10-CM

## 2013-10-29 DIAGNOSIS — I4891 Unspecified atrial fibrillation: Secondary | ICD-10-CM

## 2013-10-29 DIAGNOSIS — I5041 Acute combined systolic (congestive) and diastolic (congestive) heart failure: Secondary | ICD-10-CM

## 2013-10-29 LAB — CBC
HCT: 40.3 % (ref 39.0–52.0)
Hemoglobin: 13.3 g/dL (ref 13.0–17.0)
MCHC: 33 g/dL (ref 30.0–36.0)
MCV: 94.7 fl (ref 78.0–100.0)
Platelets: 213 10*3/uL (ref 150.0–400.0)
RBC: 4.25 Mil/uL (ref 4.22–5.81)
RDW: 14.2 % (ref 11.5–14.6)
WBC: 7.3 10*3/uL (ref 4.5–10.5)

## 2013-10-29 LAB — BASIC METABOLIC PANEL
BUN: 16 mg/dL (ref 6–23)
CO2: 28 mEq/L (ref 19–32)
Calcium: 8.8 mg/dL (ref 8.4–10.5)
Chloride: 103 mEq/L (ref 96–112)
Creatinine, Ser: 1.2 mg/dL (ref 0.4–1.5)
GFR: 85.73 mL/min (ref >60.00)
Glucose, Bld: 154 mg/dL — ABNORMAL HIGH (ref 70–99)
Potassium: 4.4 mEq/L (ref 3.5–5.1)
Sodium: 140 mEq/L (ref 135–145)

## 2013-10-29 NOTE — Progress Notes (Signed)
Harley AltoWilliam A Twaddell Date of Birth: 07/14/1966 Medical Record #[id number]  History of Present Illness: [name] is seen back today for a post hospital visit. Seen for Dr. Elease HashimotoNahser - only seen once in consultation back in 2013. He is a 48 year old male with PAF, mixed CHF with systolic dysfunction, DM, chronic back pain, morbid obesity, HTN and non compliance. CHADSVAS of 3 (DM, HTN, CHF) with 3.2% estimated annual stroke risk.   I saw him back in 2014.  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 Last seen by Dr. Johna SheriffHoxworth in January of 2014.   Most [facility] with [facility] edema and DOE. Noted to be in AF with RVR. Had ran out of his Lasix. His AF was difficult to control - now on CCB and high dose BB therapy. On xarelto. Diuresed down from 460 to 444.   Comes in today. Here with his girlfriend. Says he is feeling "great". Says his swelling has improved. Says he has scales at home - weight 444 to 447 at home. No chest pain. Tries to watch his salt. Not short of breath. Mostly limited by back pain. Says he can afford his medicine.    Current Outpatient Prescriptions  Medication Sig Dispense Refill  . aspirin EC 81 MG EC tablet Take 1 tablet (81 mg total) by mouth daily.      . carvedilol (COREG) 12.5 MG tablet Take 3 tablets (37.5 mg total) by mouth 2 (two) times daily with a meal.  180 tablet  6  . diltiazem (CARDIZEM CD) 240 MG 24 hr capsule Take 1 capsule (240 mg total) by mouth daily.  30 capsule  0  . furosemide (LASIX) 20 MG tablet Take 3 tablets (60 mg total) by mouth daily.  90 tablet  6  . glipiZIDE (GLUCOTROL) 10 MG tablet Take 10 mg by mouth 2 (two) times daily before a meal.       . HYDROcodone-acetaminophen (NORCO/VICODIN) 5-325 MG per tablet Take 1-2 tablets by mouth every 6 (six) hours as needed for moderate pain.  30 tablet  0  . metFORMIN (GLUCOPHAGE) 1000 MG tablet Take 1,000 mg by mouth 2 (two) times daily  with a meal.      . nitroGLYCERIN (NITROSTAT) 0.4 MG SL tablet Place 1 tablet (0.4 mg total) under the tongue every 5 (five) minutes x 3 doses as needed for chest pain.  30 tablet  12  . potassium chloride SA (K-DUR,KLOR-CON) 20 MEQ tablet Take 1 tablet (20 mEq total) by mouth 2 (two) times daily.  60 tablet  6  . ramipril (ALTACE) 2.5 MG capsule Take 1 capsule (2.5 mg total) by mouth 2 (two) times daily.  30 capsule  6  . Rivaroxaban (XARELTO) 20 MG TABS tablet Take 1 tablet (20 mg total) by mouth daily with supper.  30 tablet  6   No current facility-administered medications for this visit.    No Known Allergies  Past Medical History  Diagnosis Date  . Diabetes mellitus   . Morbid obesity   . Lumbar herniated disc   . 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 Obesity, morbid, BMI 50 or higher 07/13/2012  . Hypertension 07/13/2012  . Diabetes mellitus 07/13/2012  . Back pain, chronic 07/13/2012  . Complication of anesthesia     " THEY LOST ME AND BROUGHT ME BACK "  . CHF (congestive heart failure)   . Irregular heart beat   .  Shortness of breath   . Anticoagulated, on Xarelto, Chads2Vas score 2 10/02/2013    Past Surgical History  Procedure Laterality Date  . Abscess drainage    . Dental surgery    . Colonoscopy  08/22/2012    Procedure: COLONOSCOPY;  Surgeon: Shirley Friar, MD;  Location: [facility];  Service: Endoscopy;  Laterality: N/A;    History  Smoking status  . Never Smoker   Smokeless tobacco  . Never Used    History  Alcohol Use No    Comment: rarely    No family history on file.  Review of Systems: The review of systems is per the HPI.  All other systems were reviewed and are negative.  Physical Exam: BP 130/70  Pulse 80  Ht 6\' 1"  (1.854 m)  Wt 447 lb 6.4 oz (202.939 kg)  BMI 59.04 kg/m2  SpO2 95% Patient is massively obese but in no acute distress. Skin is warm and dry. Color is normal.  HEENT is unremarkable. Normocephalic/atraumatic. PERRL.  Sclera are nonicteric. Neck is supple. No masses. No JVD. Lungs are clear. Cardiac exam shows an irregular rhythm. Rate is ok. Heart tones quite distant. Abdomen is soft. Extremities are quite full with 1+ edema. Gait and ROM are intact. No gross neurologic deficits noted.   Wt Readings from Last 3 Encounters:  10/29/13 447 lb 6.4 oz (202.939 kg)  10/07/13 444 lb 11.2 oz (201.715 kg)  09/04/12 409 lb (185.521 kg)     LABORATORY DATA: EKG today with AF with rate of 106.    Lab Results  Component Value Date   WBC 8.1 09/27/2013   HGB 13.0 09/27/2013   HCT 38.5* 09/27/2013   PLT 245 09/27/2013   GLUCOSE 190* 10/07/2013   CHOL  Value: 159        ATP III CLASSIFICATION:  <200     mg/dL   Desirable  817-711  mg/dL   Borderline High  >=657    mg/dL   High        03/28/8332   TRIG 58 02/11/2010   HDL 59 02/11/2010   LDLCALC  Value: 88        Total Cholesterol/HDL:CHD Risk Coronary Heart Disease Risk Table                     Men   Women  1/2 Average Risk   3.4   3.3  Average Risk       5.0   4.4  2 X Average Risk   9.6   7.1  3 X Average Risk  23.4   11.0        Use the calculated Patient Ratio above and the CHD Risk Table to determine the patient's CHD Risk.        ATP III CLASSIFICATION (LDL):  <100     mg/dL   Optimal  832-919  mg/dL   Near or Above                    Optimal  130-159  mg/dL   Borderline  166-060  mg/dL   High  >045     mg/dL   Very High 9/97/7414   ALT 30 09/28/2013   AST 16 09/28/2013   NA 141 10/07/2013   K 4.5 10/07/2013   CL 99 10/07/2013   CREATININE 1.30 10/07/2013   BUN 34* 10/07/2013   CO2 28 10/07/2013   TSH 2.421 09/27/2013   PSA 0.22 Test Methodology:  Hybritech PSA 02/11/2010   INR 1.04 09/27/2013   HGBA1C 7.1* 09/27/2013   MICROALBUR 0.99 02/11/2010     Echo Study Conclusions from March 2015  - Procedure narrative: Transthoracic echocardiography. The study was technically difficult, as a result of poor acoustic windows, poor sound wave transmission, and body habitus.  Intravenous contrast (Definity) was administered. - Left ventricle: The cavity size was normal. There was moderate concentric hypertrophy. Systolic function was mildly reduced. The estimated ejection fraction was in the range of 45% to 50%. Definity contrast was administered which did not aid in wall motion or EF determination. The study is not technically sufficient to allow evaluation of LV diastolic function. - Mitral valve: Poorly visualized. Mild regurgitation. - Left atrium: The atrium was mildly dilated. - Inferior vena cava: The vessel was dilated; the respirophasic diameter changes were blunted (< 50%); findings are consistent with elevated central venous pressure. - Pericardium, extracardiac: There was no pericardial effusion.   Assessment / Plan: 1. PAF - looks to be chronic now - would favor conservative management at this time - he is committed to long term anticoagulation - compliance is an issue which I do feel may be a recurring them. Discussed with Dr. Elease Hashimoto - will manage with rate control as best we can and anticoagulation.   2. Morbid obesity - this is the crux of his issues - unfortunately, I do not see this changing.   3. Systolic HF - EF 16% now. Still with swelling but says improved. Recheck his labs today.  4. Chronic back pain   See him back in a month. Check labs today. Restrict salt. No change in medicines at this time.   Patient is agreeable to this plan and will call if any problems develop in the interim.   Rosalio Macadamia, RN, ANP-C [facility] [address] [place], [place]  [address] [phone]

## 2013-10-29 NOTE — Telephone Encounter (Signed)
It looks like he had a consult by Dr Nahser/ this should be his pt. I see that he has seen Norma Fredrickson NP x 2, he was to see Dr Elease Hashimoto but missed his app// 2 recall letters were sent but has not established with him in the office yet. If pt is willing, he should be seen by Dr Elease Hashimoto.

## 2013-10-29 NOTE — Telephone Encounter (Signed)
New message    Need clarification on whether this is Dr Harvie Bridge pt.  They received back from [place] on an order they had faxed to [place] stating that this was not Dr Harvie Bridge patient however, he already signed other papers in the hosp on this patient.

## 2013-10-29 NOTE — Patient Instructions (Signed)
We will recheck your labs today  I will see you in a month   Continue with your current medicines.  Weigh yourself each morning and record.  Limit sodium intake. Goal is to have less than 1500 mg (1.5G) of salt per day.  1.5 Gram Low Sodium Diet A 1.5 gram sodium diet restricts the amount of sodium in the diet to no more than 1.5 g or 1500 mg daily. The American Heart Association recommends Americans over the age of 41 to consume no more than 1500 mg of sodium each day to reduce the risk of developing high blood pressure. Research also shows that limiting sodium may reduce heart attack and stroke risk. Many foods contain sodium for flavor and sometimes as a preservative. When the amount of sodium in a diet needs to be low, it is important to know what to look for when choosing foods and drinks. The following includes some information and guidelines to help make it easier for you to adapt to a low sodium diet. QUICK TIPS  Do not add salt to food.  Avoid convenience items and fast food.  Choose unsalted snack foods.  Buy lower sodium products, often labeled as "lower sodium" or "no salt added."  Check food labels to learn how much sodium is in 1 serving.  When eating at a restaurant, ask that your food be prepared with less salt or none, if possible. READING FOOD LABELS FOR SODIUM INFORMATION The nutrition facts label is a good place to find how much sodium is in foods. Look for products with no more than 400 mg of sodium per serving. Remember that 1.5 g = 1500 mg. The food label may also list foods as:  Sodium-free: Less than 5 mg in a serving.  Very low sodium: 35 mg or less in a serving.  Low-sodium: 140 mg or less in a serving.  Light in sodium: 50% less sodium in a serving. For example, if a food that usually has 300 mg of sodium is changed to become light in sodium, it will have 150 mg of sodium.  Reduced sodium: 25% less sodium in a serving. For example, if a food that  usually has 400 mg of sodium is changed to reduced sodium, it will have 300 mg of sodium. CHOOSING FOODS Grains  Avoid: Salted crackers and snack items. Some cereals, including instant hot cereals. Bread stuffing and biscuit mixes. Seasoned rice or pasta mixes.  Choose: Unsalted snack items. Low-sodium cereals, oats, puffed wheat and rice, shredded wheat. English muffins and bread. Pasta. Meats  Avoid: Salted, canned, smoked, spiced, pickled meats, including fish and poultry. Bacon, ham, sausage, cold cuts, hot dogs, anchovies.  Choose: Low-sodium canned tuna and salmon. Fresh or frozen meat, poultry, and fish. Dairy  Avoid: Processed cheese and spreads. Cottage cheese. Buttermilk and condensed milk. Regular cheese.  Choose: Milk. Low-sodium cottage cheese. Yogurt. Sour cream. Low-sodium cheese. Fruits and Vegetables  Avoid: Regular canned vegetables. Regular canned tomato sauce and paste. Frozen vegetables in sauces. Olives. Rosita Fire. Relishes. Sauerkraut.  Choose: Low-sodium canned vegetables. Low-sodium tomato sauce and paste. Frozen or fresh vegetables. Fresh and frozen fruit. Condiments  Avoid: Canned and packaged gravies. Worcestershire sauce. Tartar sauce. Barbecue sauce. Soy sauce. Steak sauce. Ketchup. Onion, garlic, and table salt. Meat flavorings and tenderizers.  Choose: Fresh and dried herbs and spices. Low-sodium varieties of mustard and ketchup. Lemon juice. Tabasco sauce. Horseradish. SAMPLE 1.5 GRAM SODIUM MEAL PLAN Breakfast / Sodium (mg)  1 cup low-fat milk /  143 mg  1 whole-wheat English muffin / 240 mg  1 tbs heart-healthy margarine / 153 mg  1 hard-boiled egg / 139 mg  1 small orange / 0 mg Lunch / Sodium (mg)  1 cup raw carrots / 76 mg  2 tbs no salt added peanut butter / 5 mg  2 slices whole-wheat bread / 270 mg  1 tbs jelly / 6 mg   cup red grapes / 2 mg Dinner / Sodium (mg)  1 cup whole-wheat pasta / 2 mg  1 cup low-sodium tomato sauce /  73 mg  3 oz lean ground beef / 57 mg  1 small side salad (1 cup raw spinach leaves,  cup cucumber,  cup yellow bell pepper) with 1 tsp olive oil and 1 tsp red wine vinegar / 25 mg Snack / Sodium (mg)  1 container low-fat vanilla yogurt / 107 mg  3 graham cracker squares / 127 mg Nutrient Analysis  Calories: 1745  Protein: 75 g  Carbohydrate: 237 g  Fat: 57 g  Sodium: 1425 mg Document Released: 07/12/2005 Document Revised: 10/04/2011 Document Reviewed: 10/13/2009 [employer] Patient Information 2014 [place].

## 2013-11-26 ENCOUNTER — Ambulatory Visit: Payer: Medicare Other | Admitting: Nurse Practitioner

## 2013-12-06 ENCOUNTER — Encounter (HOSPITAL_COMMUNITY): Payer: Self-pay | Admitting: Emergency Medicine

## 2013-12-06 ENCOUNTER — Emergency Department (HOSPITAL_COMMUNITY): Payer: Medicare Other

## 2013-12-06 ENCOUNTER — Emergency Department (HOSPITAL_COMMUNITY)
Admission: EM | Admit: 2013-12-06 | Discharge: 2013-12-06 | Disposition: A | Discharge status: Home / Self Care | Payer: Medicare Other | Attending: Emergency Medicine | Admitting: Emergency Medicine

## 2013-12-06 DIAGNOSIS — Z8739 Personal history of other diseases of the musculoskeletal system and connective tissue: Secondary | ICD-10-CM | POA: Insufficient documentation

## 2013-12-06 DIAGNOSIS — Z8719 Personal history of other diseases of the digestive system: Secondary | ICD-10-CM | POA: Insufficient documentation

## 2013-12-06 DIAGNOSIS — Z7901 Long term (current) use of anticoagulants: Secondary | ICD-10-CM | POA: Insufficient documentation

## 2013-12-06 DIAGNOSIS — G8929 Other chronic pain: Secondary | ICD-10-CM | POA: Insufficient documentation

## 2013-12-06 DIAGNOSIS — W010XXA Fall on same level from slipping, tripping and stumbling without subsequent striking against object, initial encounter: Secondary | ICD-10-CM | POA: Insufficient documentation

## 2013-12-06 DIAGNOSIS — E119 Type 2 diabetes mellitus without complications: Secondary | ICD-10-CM | POA: Insufficient documentation

## 2013-12-06 DIAGNOSIS — Y929 Unspecified place or not applicable: Secondary | ICD-10-CM | POA: Insufficient documentation

## 2013-12-06 DIAGNOSIS — IMO0002 Reserved for concepts with insufficient information to code with codable children: Secondary | ICD-10-CM | POA: Insufficient documentation

## 2013-12-06 DIAGNOSIS — I509 Heart failure, unspecified: Secondary | ICD-10-CM | POA: Insufficient documentation

## 2013-12-06 DIAGNOSIS — I10 Essential (primary) hypertension: Secondary | ICD-10-CM | POA: Insufficient documentation

## 2013-12-06 DIAGNOSIS — X500XXA Overexertion from strenuous movement or load, initial encounter: Secondary | ICD-10-CM | POA: Insufficient documentation

## 2013-12-06 DIAGNOSIS — S62609A Fracture of unspecified phalanx of unspecified finger, initial encounter for closed fracture: Secondary | ICD-10-CM

## 2013-12-06 DIAGNOSIS — Y939 Activity, unspecified: Secondary | ICD-10-CM | POA: Insufficient documentation

## 2013-12-06 DIAGNOSIS — Z79899 Other long term (current) drug therapy: Secondary | ICD-10-CM | POA: Insufficient documentation

## 2013-12-06 MED ORDER — HYDROCODONE-ACETAMINOPHEN 5-325 MG PO TABS
2.0000 | ORAL_TABLET | Freq: Once | ORAL | Status: AC
  Administered 2013-12-06: 2 via ORAL
  Filled 2013-12-06: qty 2

## 2013-12-06 MED ORDER — HYDROCODONE-ACETAMINOPHEN 5-325 MG PO TABS: 2.0000 | ORAL_TABLET | ORAL | Status: DC | PRN

## 2013-12-06 NOTE — Progress Notes (Signed)
Orthopedic Tech Progress Note Patient Details:  Maurice Nguyen 1965/11/07 [id number]  Ortho Devices Type of Ortho Device: Finger splint Ortho Device/Splint Location: RUE Ortho Device/Splint Interventions: Ordered;Application   [name] 12/06/2013, 5:11 PM

## 2013-12-06 NOTE — ED Notes (Signed)
Pt states he tripped and fell, bending his R index finger backwards. Slight swelling noted to proximal point.

## 2013-12-06 NOTE — ED Notes (Addendum)
He states he tripped and fell this afternoon and caught himself with R hand. States he felt his R 2nd finger "bend backwards."  He states hes had pain and swelling in this finger since.

## 2013-12-06 NOTE — ED Provider Notes (Signed)
CSN: [id number]     Arrival date & time 12/06/13  1458 History   This chart was scribed for non-physician practitioner Dierdre Forth, PA-C, working with Hurman Horn, MD, by Yevette Edwards, ED Scribe. This patient was seen in room TR09C/TR09C and the patient's care was started at 4:51 PM.   First MD Initiated Contact with Patient 12/06/13 1613     Chief Complaint  Patient presents with  . Finger Injury    The history is provided by the patient and the spouse. No language interpreter was used.   HPI Comments: DEONTAE BOXBERGER is a 48 y.o. Male, with a h/o DM, who presents to the Emergency Department complaining of a fall. He is complaining of pain to his right second finger which he used to catch himself in the fall. He reports limited ROM of the finger secondary to pain. The pt denies hearing a "pop" in the fall. He also denies head trauma, LOC, neck pain, back pain, and wrist pain.  He denies any known allergies.  He has not attempted any OTC treatments PTA.  Movement and palpation make it worse and nothing makes it better.    Past Medical History  Diagnosis Date  . Diabetes mellitus   . Morbid obesity   . Lumbar herniated disc   . 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 Obesity, morbid, BMI 50 or higher 07/13/2012  . Hypertension 07/13/2012  . Diabetes mellitus 07/13/2012  . Back pain, chronic 07/13/2012  . Complication of anesthesia     " THEY LOST ME AND BROUGHT ME BACK "  . CHF (congestive heart failure)   . Irregular heart beat   . Shortness of breath   . Anticoagulated, on Xarelto, Chads2Vas score 2 10/02/2013   Past Surgical History  Procedure Laterality Date  . Abscess drainage    . Dental surgery    . Colonoscopy  08/22/2012    Procedure: COLONOSCOPY;  Surgeon: Shirley Friar, MD;  Location: [facility];  Service: Endoscopy;  Laterality: N/A;   History reviewed. No pertinent family history. History  Substance Use Topics  . Smoking status: Never Smoker   .  Smokeless tobacco: Never Used  . Alcohol Use: No     Comment: rarely    Review of Systems  Musculoskeletal: Positive for arthralgias. Negative for back pain and neck pain.  Neurological: Negative for syncope.  All other systems reviewed and are negative.     Allergies  Review of patient's allergies indicates no known allergies.  Home Medications   Prior to Admission medications   Medication Sig Start Date End Date Taking? Authorizing Provider  carvedilol (COREG) 12.5 MG tablet Take 3 tablets (37.5 mg total) by mouth 2 (two) times daily with a meal. 10/07/13  Yes Jodelle Gross, NP  diltiazem (CARDIZEM CD) 240 MG 24 hr capsule Take 1 capsule (240 mg total) by mouth daily. 08/24/12  Yes Nishant Dhungel, MD  furosemide (LASIX) 20 MG tablet Take 3 tablets (60 mg total) by mouth daily. 10/07/13  Yes Jodelle Gross, NP  glipiZIDE (GLUCOTROL) 10 MG tablet Take 10 mg by mouth 2 (two) times daily before a meal.    Yes Historical Provider, MD  HYDROcodone-acetaminophen (NORCO/VICODIN) 5-325 MG per tablet Take 1-2 tablets by mouth every 6 (six) hours as needed for moderate pain. 10/07/13  Yes Jodelle Gross, NP  metFORMIN (GLUCOPHAGE) 1000 MG tablet Take 1,000 mg by mouth 2 (two) times daily with a meal. 07/13/12  Yes Sherrie George, PA-C  nitroGLYCERIN (NITROSTAT) 0.4 MG SL tablet Place 1 tablet (0.4 mg total) under the tongue every 5 (five) minutes x 3 doses as needed for chest pain. 10/07/13  Yes Jodelle Gross, NP  potassium chloride SA (K-DUR,KLOR-CON) 20 MEQ tablet Take 1 tablet (20 mEq total) by mouth 2 (two) times daily. 10/07/13  Yes Jodelle Gross, NP  ramipril (ALTACE) 2.5 MG capsule Take 1 capsule (2.5 mg total) by mouth 2 (two) times daily. 10/07/13  Yes Jodelle Gross, NP  Rivaroxaban (XARELTO) 20 MG TABS tablet Take 1 tablet (20 mg total) by mouth daily with supper. 10/07/13  Yes Jodelle Gross, NP   Triage Vitals: BP 102/68  Pulse 67  Temp(Src) 98.8 F  (37.1 C) (Oral)  Resp 18  SpO2 96%  Physical Exam  Nursing note and vitals reviewed. Constitutional: He is oriented to person, place, and time. He appears well-developed and well-nourished. No distress.  HENT:  Head: Normocephalic and atraumatic.  Eyes: Conjunctivae and EOM are normal.  Neck: Normal range of motion. Neck supple. No tracheal deviation present.  Cardiovascular: Normal rate, regular rhythm and intact distal pulses.   Capillary refill less than 3 seconds  Pulmonary/Chest: Effort normal and breath sounds normal. No respiratory distress.  Musculoskeletal: Normal range of motion. He exhibits tenderness. He exhibits no edema.  ROM: Limited ROM of right pointer finger. Full ROM of DIP. Limited ROM of PIP. Full ROM of MCP. Swelling to PIP and proximal phalanx. Pain to palpation. Sensation intact. Strong grip strength. Full ROM of right wrist.   Neurological: He is alert and oriented to person, place, and time. Coordination normal.  Sensation intact to dull and sharp Strength strong and equal grip strength; 3/5 resisted flexion and extension of the right pointer finger due to pain; 5/5 in all other fingers  Skin: Skin is warm and dry. He is not diaphoretic.  No tenting of the skin  Psychiatric: He has a normal mood and affect. His behavior is normal.    ED Course  Procedures (including critical care time)  DIAGNOSTIC STUDIES: Oxygen Saturation is 96% on room air, normal by my interpretation.    COORDINATION OF CARE:  4:53 PM- Discussed treatment plan with patient, and the patient agreed to the plan. The plan includes imaging.   Labs Review Labs Reviewed - No data to display  Imaging Review Dg Finger Index Right  12/06/2013   CLINICAL DATA:  Person fell directly on right index finger with pain and swelling  EXAM: RIGHT INDEX FINGER 2+V  COMPARISON:  None.  FINDINGS: There is a nondisplaced fracture through the middle phalanx of the right second digit which is  longitudinally oriented. There does appear to be involvement of the base of the middle phalanx as well with the fracture extending to the right second PIP joint. No other abnormality is seen.  IMPRESSION: Nondisplaced fracture through the proximal phalanx of the right second digit which appears to extend to the right second PIP joint   Electronically Signed   By: Dwyane Dee M.D.   On: 12/06/2013 16:51     EKG Interpretation None      MDM   Final diagnoses:  Finger fracture, right   [name] presents with finger injury.  Patient X-Ray with Nondisplaced fracture through the proximal phalanx of the right second digit which appears to extend to the right second PIP joint.  I personally reviewed the imaging tests through PACS system.  I reviewed available ER/hospitalization records through  the EMR.  Pain managed in ED. Pt advised to follow up with orthopedics for further evaluation and treatment.  Pain managed in the department. Patient given static finger splint while in ED, conservative therapy recommended and discussed. Patient will be dc home & is agreeable with above plan. I have also discussed reasons to return immediately to the ER.  Patient expresses understanding and agrees with plan.  It has been determined that no acute conditions requiring further emergency intervention are present at this time. The patient/guardian have been advised of the diagnosis and plan. We have discussed signs and symptoms that warrant return to the ED, such as changes or worsening in symptoms.   Vital signs are stable at discharge.   BP 102/68  Pulse 67  Temp(Src) 98.8 F (37.1 C) (Oral)  Resp 18  SpO2 96%  Patient/guardian has voiced understanding and agreed to follow-up with the PCP or specialist.  I personally performed the services described in this documentation, which was scribed in my presence. The recorded information has been reviewed and is accurate.    Dahlia ClientHannah Keyanna Sandefer,  PA-C 12/06/13 1705

## 2013-12-06 NOTE — Discharge Instructions (Signed)
1. Medications: vicodin, usual home medications 2. Treatment: rest, drink plenty of fluids, ice, elevate 3. Follow Up: Please followup with Hand surgery within 1 week for further evaluation    Finger Fracture Fractures of fingers are breaks in the bones of the fingers. There are many types of fractures. There are different ways of treating these fractures. Your health care provider will discuss the best way to treat your fracture. CAUSES Traumatic injury is the main cause of broken fingers. These include:  Injuries while playing sports.  Workplace injuries.  Falls. RISK FACTORS Activities that can increase your risk of finger fractures include:  Sports.  Workplace activities that involve machinery.  A condition called osteoporosis, which can make your bones less dense and cause them to fracture more easily. SIGNS AND SYMPTOMS The main symptoms of a broken finger are pain and swelling within 15 minutes after the injury. Other symptoms include:  Bruising of your finger.  Stiffness of your finger.  Numbness of your finger.  Exposed bones (compound fracture) if the fracture is severe. DIAGNOSIS  The best way to diagnose a broken bone is with X-ray imaging. Additionally, your health care provider will use this X-ray image to evaluate the position of the broken finger bones.  TREATMENT  Finger fractures can be treated with:   Nonreduction This means the bones are in place. The finger is splinted without changing the positions of the bone pieces. The splint is usually left on for about a week to 10 days. This will depend on your fracture and what your health care provider thinks.  Closed reduction The bones are put back into position without using surgery. The finger is then splinted.  Open reduction and internal fixation The fracture site is opened. Then the bone pieces are fixed into place with pins or some type of hardware. This is seldom required. It depends on the severity of  the fracture. HOME CARE INSTRUCTIONS   Follow your health care provider's instructions regarding activities, exercises, and physical therapy.  Only take over-the-counter or prescription medicines for pain, discomfort, or fever as directed by your health care provider. SEEK MEDICAL CARE IF: You have pain or swelling that limits the motion or use of your fingers. SEEK IMMEDIATE MEDICAL CARE IF:  Your finger becomes numb. MAKE SURE YOU:   Understand these instructions.  Will watch your condition.  Will get help right away if you are not doing well or get worse. Document Released: 10/24/2000 Document Revised: 05/02/2013 Document Reviewed: 02/21/2013 [facility] Patient Information 2014 [place], [place].

## 2013-12-06 NOTE — ED Notes (Signed)
Ortho paged, will come put finger splint on finger

## 2013-12-07 NOTE — ED Provider Notes (Signed)
Medical screening examination/treatment/procedure(s) were performed by non-physician practitioner and as supervising physician I was immediately available for consultation/collaboration.   EKG Interpretation None       Marquet Faircloth M Sylvester Salonga, MD 12/07/13 1339 

## 2013-12-10 ENCOUNTER — Other Ambulatory Visit: Payer: Self-pay | Admitting: Orthopedic Surgery

## 2013-12-10 DIAGNOSIS — T148XXA Other injury of unspecified body region, initial encounter: Secondary | ICD-10-CM

## 2013-12-13 ENCOUNTER — Other Ambulatory Visit: Payer: Medicare Other

## 2013-12-25 ENCOUNTER — Ambulatory Visit
Admission: RE | Admit: 2013-12-25 | Discharge: 2013-12-25 | Disposition: A | Discharge status: Home / Self Care | Payer: Medicare Other | Source: Ambulatory Visit | Attending: Orthopedic Surgery | Admitting: Orthopedic Surgery

## 2013-12-25 DIAGNOSIS — T148XXA Other injury of unspecified body region, initial encounter: Secondary | ICD-10-CM

## 2014-07-08 ENCOUNTER — Emergency Department (HOSPITAL_COMMUNITY)
Admission: EM | Admit: 2014-07-08 | Discharge: 2014-07-09 | Disposition: A | Discharge status: Home / Self Care | Payer: Medicare Other | Attending: Emergency Medicine | Admitting: Emergency Medicine

## 2014-07-08 ENCOUNTER — Encounter (HOSPITAL_COMMUNITY): Payer: Self-pay | Admitting: Emergency Medicine

## 2014-07-08 ENCOUNTER — Emergency Department (HOSPITAL_COMMUNITY): Payer: Medicare Other

## 2014-07-08 DIAGNOSIS — G8929 Other chronic pain: Secondary | ICD-10-CM | POA: Diagnosis not present

## 2014-07-08 DIAGNOSIS — M791 Myalgia: Secondary | ICD-10-CM | POA: Diagnosis not present

## 2014-07-08 DIAGNOSIS — Z79899 Other long term (current) drug therapy: Secondary | ICD-10-CM | POA: Diagnosis not present

## 2014-07-08 DIAGNOSIS — Z7901 Long term (current) use of anticoagulants: Secondary | ICD-10-CM | POA: Insufficient documentation

## 2014-07-08 DIAGNOSIS — Z8719 Personal history of other diseases of the digestive system: Secondary | ICD-10-CM | POA: Diagnosis not present

## 2014-07-08 DIAGNOSIS — R600 Localized edema: Secondary | ICD-10-CM

## 2014-07-08 DIAGNOSIS — Z8739 Personal history of other diseases of the musculoskeletal system and connective tissue: Secondary | ICD-10-CM | POA: Insufficient documentation

## 2014-07-08 DIAGNOSIS — I10 Essential (primary) hypertension: Secondary | ICD-10-CM | POA: Insufficient documentation

## 2014-07-08 DIAGNOSIS — R079 Chest pain, unspecified: Secondary | ICD-10-CM

## 2014-07-08 DIAGNOSIS — R2243 Localized swelling, mass and lump, lower limb, bilateral: Secondary | ICD-10-CM | POA: Diagnosis not present

## 2014-07-08 DIAGNOSIS — I5041 Acute combined systolic (congestive) and diastolic (congestive) heart failure: Secondary | ICD-10-CM | POA: Insufficient documentation

## 2014-07-08 DIAGNOSIS — R0602 Shortness of breath: Secondary | ICD-10-CM | POA: Diagnosis present

## 2014-07-08 DIAGNOSIS — E119 Type 2 diabetes mellitus without complications: Secondary | ICD-10-CM | POA: Insufficient documentation

## 2014-07-08 LAB — I-STAT TROPONIN, ED: Troponin i, poc: 0.02 ng/mL (ref 0.00–0.08)

## 2014-07-08 LAB — BASIC METABOLIC PANEL
Anion gap: 13 (ref 5–15)
BUN: 14 mg/dL (ref 6–23)
CO2: 23 mEq/L (ref 19–32)
Calcium: 8.9 mg/dL (ref 8.4–10.5)
Chloride: 102 mEq/L (ref 96–112)
Creatinine, Ser: 1.01 mg/dL (ref 0.50–1.35)
GFR calc Af Amer: >90 mL/min (ref >90)
GFR calc non Af Amer: 85 mL/min — ABNORMAL LOW (ref >90)
Glucose, Bld: 183 mg/dL — ABNORMAL HIGH (ref 70–99)
Potassium: 4.8 mEq/L (ref 3.7–5.3)
Sodium: 138 mEq/L (ref 137–147)

## 2014-07-08 LAB — CBC
HCT: 41.8 % (ref 39.0–52.0)
Hemoglobin: 13.8 g/dL (ref 13.0–17.0)
MCH: 31.2 pg (ref 26.0–34.0)
MCHC: 33 g/dL (ref 30.0–36.0)
MCV: 94.6 fL (ref 78.0–100.0)
Platelets: 214 10*3/uL (ref 150–400)
RBC: 4.42 MIL/uL (ref 4.22–5.81)
RDW: 13.5 % (ref 11.5–15.5)
WBC: 6.6 10*3/uL (ref 4.0–10.5)

## 2014-07-08 LAB — PRO B NATRIURETIC PEPTIDE: Pro B Natriuretic peptide (BNP): 1232 pg/mL — ABNORMAL HIGH (ref 0–125)

## 2014-07-08 MED ORDER — HYDROCODONE-ACETAMINOPHEN 5-325 MG PO TABS
1.0000 | ORAL_TABLET | Freq: Once | ORAL | Status: AC
  Administered 2014-07-08: 1 via ORAL
  Filled 2014-07-08: qty 1

## 2014-07-08 MED ORDER — FUROSEMIDE 10 MG/ML IJ SOLN
80.0000 mg | Freq: Once | INTRAMUSCULAR | Status: AC
  Administered 2014-07-08: 80 mg via INTRAVENOUS
  Filled 2014-07-08: qty 8

## 2014-07-08 NOTE — ED Notes (Signed)
Maurice Nguyen in main lab states that BNP will be added on.

## 2014-07-08 NOTE — ED Provider Notes (Signed)
CSN: [id number]     Arrival date & time 07/08/14  1656 History   First MD Initiated Contact with Patient 07/08/14 1905     Chief Complaint  Patient presents with  . Shortness of Breath     (Consider location/radiation/quality/duration/timing/severity/associated sxs/prior Treatment) HPI Comments: Patient with history of morbid obesity, diabetes, congestive heart failure on Lasix, chronic A. fib on Xarelto however patient does not think he has had this since August -- presents with complaint of worsening lower extremity swelling and shortness of breath over the past 3 weeks. Patient states that he has been compliant with his Lasix, taking a dose 3 times a day. He states that he always sleeps sitting up. He complains of tenderness and swelling in his legs up to his thighs. No chest pains. No abdominal pain. No nausea, vomiting, or diarrhea. No urinary symptoms other than frequency after taking Lasix. The onset of this condition was gradual. The course is constant. Aggravating factors: lying flat. Alleviating factors: none.   Per previous hospitalization notes, it appears weight when dieuresed has been around 444lbs. He is at 461lbs today.    The history is provided by the patient and medical records.    Past Medical History  Diagnosis Date  . Diabetes mellitus   . Morbid obesity   . Lumbar herniated disc   . 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 Obesity, morbid, BMI 50 or higher 07/13/2012  . Hypertension 07/13/2012  . Diabetes mellitus 07/13/2012  . Back pain, chronic 07/13/2012  . Complication of anesthesia     " THEY LOST ME AND BROUGHT ME BACK "  . CHF (congestive heart failure)   . Irregular heart beat   . Shortness of breath   . Anticoagulated, on Xarelto, Chads2Vas score 2 10/02/2013   Past Surgical History  Procedure Laterality Date  . Abscess drainage    . Dental surgery    . Colonoscopy  08/22/2012    Procedure: COLONOSCOPY;  Surgeon: Shirley Friar, MD;  Location: [facility]  ENDOSCOPY;  Service: Endoscopy;  Laterality: N/A;   No family history on file. History  Substance Use Topics  . Smoking status: Never Smoker   . Smokeless tobacco: Never Used  . Alcohol Use: No     Comment: rarely    Review of Systems  Constitutional: Negative for fever.  HENT: Negative for rhinorrhea and sore throat.   Eyes: Negative for redness.  Respiratory: Negative for cough.   Cardiovascular: Positive for leg swelling. Negative for chest pain.  Gastrointestinal: Negative for nausea, vomiting, abdominal pain and diarrhea.  Genitourinary: Negative for dysuria and scrotal swelling.  Musculoskeletal: Positive for myalgias.  Skin: Positive for color change. Negative for rash.  Neurological: Negative for headaches.   Allergies  Review of patient's allergies indicates no known allergies.  Home Medications   Prior to Admission medications   Medication Sig Start Date End Date Taking? Authorizing Provider  carvedilol (COREG) 12.5 MG tablet Take 3 tablets (37.5 mg total) by mouth 2 (two) times daily with a meal. 10/07/13  Yes Jodelle Gross, NP  furosemide (LASIX) 20 MG tablet Take 3 tablets (60 mg total) by mouth daily. 10/07/13  Yes Jodelle Gross, NP  glipiZIDE (GLUCOTROL) 10 MG tablet Take 10 mg by mouth 2 (two) times daily before a meal.    Yes Historical Provider, MD  metFORMIN (GLUCOPHAGE) 1000 MG tablet Take 1,000 mg by mouth 2 (two) times daily with a meal. 07/13/12  Yes Sherrie George, PA-C  potassium chloride  SA (K-DUR,KLOR-CON) 20 MEQ tablet Take 1 tablet (20 mEq total) by mouth 2 (two) times daily. 10/07/13  Yes Jodelle Gross, NP  Rivaroxaban (XARELTO) 20 MG TABS tablet Take 1 tablet (20 mg total) by mouth daily with supper. 10/07/13  Yes Jodelle Gross, NP  diltiazem (CARDIZEM CD) 240 MG 24 hr capsule Take 1 capsule (240 mg total) by mouth daily. 08/24/12   Nishant Dhungel, MD  nitroGLYCERIN (NITROSTAT) 0.4 MG SL tablet Place 1 tablet (0.4 mg total) under  the tongue every 5 (five) minutes x 3 doses as needed for chest pain. 10/07/13   Jodelle Gross, NP  ramipril (ALTACE) 2.5 MG capsule Take 1 capsule (2.5 mg total) by mouth 2 (two) times daily. 10/07/13   Jodelle Gross, NP   BP 133/96 mmHg  Pulse 96  Temp(Src) 98.5 F (36.9 C) (Oral)  Resp 26  Ht 6\' 1"  (1.854 m)  Wt 461 lb 5 oz (209.25 kg)  BMI 60.88 kg/m2  SpO2 100%   Physical Exam  Constitutional: He appears well-developed and well-nourished.  HENT:  Head: Normocephalic and atraumatic.  Eyes: Conjunctivae are normal. Right eye exhibits no discharge. Left eye exhibits no discharge.  Neck: Normal range of motion. Neck supple.  Cardiovascular: Normal rate, regular rhythm and normal heart sounds.   Pulmonary/Chest: Effort normal and breath sounds normal.  Abdominal: Soft. There is no tenderness.  Musculoskeletal: He exhibits edema and tenderness.  Lower extremities are tense and tender, 2+ pitting edema to knees. There is mild diffuse redness bilaterally likely representing mild dermatitis but no significant cellulitis noted.   Neurological: He is alert.  Skin: Skin is warm and dry. There is erythema.  Psychiatric: He has a normal mood and affect.  Nursing note and vitals reviewed.   ED Course  Procedures (including critical care time) Labs Review Labs Reviewed  BASIC METABOLIC PANEL - Abnormal; Notable for the following:    Glucose, Bld 183 (*)    GFR calc non Af Amer 85 (*)    All other components within normal limits  PRO B NATRIURETIC PEPTIDE - Abnormal; Notable for the following:    Pro B Natriuretic peptide (BNP) 1232.0 (*)    All other components within normal limits  CBC  I-STAT TROPOININ, ED    Imaging Review Dg Chest 2 View  07/08/2014   CLINICAL DATA:  Chest pain.  EXAM: CHEST  2 VIEW  COMPARISON:  September 27, 2013.  FINDINGS: Stable cardiomegaly. No pneumothorax or pleural effusion is noted. No acute pulmonary disease is noted. Bony thorax is intact.   IMPRESSION: No acute cardiopulmonary abnormality seen.   Electronically Signed   By: Roque Lias M.D.   On: 07/08/2014 18:19     EKG Interpretation None       7:39 PM Patient seen and examined. Work-up initiated. Medications ordered. Awaiting completion of work-up.   Vital signs reviewed and are as follows: BP 133/96 mmHg  Pulse 96  Temp(Src) 98.5 F (36.9 C) (Oral)  Resp 26  Ht 6\' 1"  (1.854 m)  Wt 461 lb 5 oz (209.25 kg)  BMI 60.88 kg/m2  SpO2 100%  12:22 AM Patient seen previously by Dr. Romeo Apple.   I spoke with cardiology fellow regarding plan. He will consult. Possibility presented to patient to double Lasix and potassium x 3 days and closely follow-up in office. Cardiology to see patient and help decide what is best. Patient is poor historian and has history of non-compliance.   Pending cardiology  consult at this time.   2:12 AM Cards has seen and spoken with patient. Will proceed with plan as above.   Patient has almost 3L output here from IV Lasix.  Patient encouraged to return with worsening sx, SOB, CP, or if he has other concerns. Also discussed with family at bedside.   MDM   Final diagnoses:  Bilateral lower extremity edema  Acute combined systolic and diastolic CHF, NYHA class 4, difficult to assess Diastolic function on echo    Patient with bilateral edema x 3 weeks likely related to CHF. CXR clear. Cards has seen. Good IV output in ED with IV lasix. Will increase Lasix dose per above. Cards will follow closely this week.    Renne CriglerJoshua Navon Kotowski, PA-C 07/09/14 [id number]  Purvis SheffieldForrest Harrison, MD 07/09/14 [phone]

## 2014-07-08 NOTE — ED Notes (Addendum)
Pt reports sob  X 2 days. Sent here by PCP for evaluation. Hx CHF. Pt presents with labored breathing. Breath sounds diminished. Swelling to BLE.

## 2014-07-09 DIAGNOSIS — I5043 Acute on chronic combined systolic (congestive) and diastolic (congestive) heart failure: Secondary | ICD-10-CM

## 2014-07-09 DIAGNOSIS — R2243 Localized swelling, mass and lump, lower limb, bilateral: Secondary | ICD-10-CM | POA: Diagnosis not present

## 2014-07-09 DIAGNOSIS — I4891 Unspecified atrial fibrillation: Secondary | ICD-10-CM | POA: Diagnosis not present

## 2014-07-09 MED ORDER — HYDROCODONE-ACETAMINOPHEN 5-325 MG PO TABS
2.0000 | ORAL_TABLET | Freq: Once | ORAL | Status: AC
  Administered 2014-07-09: 2 via ORAL
  Filled 2014-07-09: qty 2

## 2014-07-09 NOTE — Consult Note (Signed)
CARDIOLOGY CONSULT NOTE  Patient ID: Maurice Nguyen, MRN: [id number], DOB/AGE: 50/09/1965 50 y.o. Admit date: 07/08/2014 Date of Consult: 07/09/2014  Primary Physician: Burtis JunesBLOUNT,ALVIN VINCENT, MD Primary Cardiologist: Dr. Elease HashimotoNahser  Chief Complaint: SOB, LE swelling Reason for Consultation: management and disposition  HPI: 48 y.o. 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 07[facility]2014 with complaints of worsening LE edema and dyspnea on exertion. He reports that it has been progressive over the last couple of weeks prompting him to finally follow up with his PCP who promptly sent him over to the ER.  He reports some difficulties in the last year due to not refills and Rx not being available though the cause appears to be failure to make appointments with his physicians. He reports that he has been taking the lasix (though at 20 mg tid) and KCL but not be taking rivaxaoban for his afib as the Rx expired.   He is aware of avoiding salt and indicates that he actively avoids it.  Was given 80 IV lasix and has put out 4 liters. Pt reports sx somewhat improved.   Does not weigh himself but at last d/c was 445 lbs, now 461 in the ER.  Cxray without e/o overload. BNP up to 1232. Troponin neg.  EKG shows afib with variable rate, Qs in V1-V4, PRWP.    Past Medical History  Diagnosis Date  . Diabetes mellitus   . Morbid obesity   . Lumbar herniated disc   . 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 Obesity, morbid, BMI 50 or higher 07/13/2012  . Hypertension 07/13/2012  . Diabetes mellitus 07/13/2012  . Back pain, chronic 07/13/2012  . Complication of anesthesia     " THEY LOST ME AND BROUGHT ME BACK "  . CHF (congestive heart failure)   . Irregular heart beat   . Shortness of breath   . Anticoagulated, on Xarelto, Chads2Vas score 2 10/02/2013      Surgical History:  Past Surgical  History  Procedure Laterality Date  . Abscess drainage    . Dental surgery    . Colonoscopy  08/22/2012    Procedure: COLONOSCOPY;  Surgeon: Shirley FriarVincent C. Schooler, MD;  Location: [facility] ENDOSCOPY;  Service: Endoscopy;  Laterality: N/A;     Home Meds: Prior to Admission medications   Medication Sig Start Date End Date Taking? Authorizing Provider  carvedilol (COREG) 12.5 MG tablet Take 3 tablets (37.5 mg total) by mouth 2 (two) times daily with a meal. 10/07/13  Yes Jodelle GrossKathryn M Lawrence, NP  furosemide (LASIX) 20 MG tablet Take 3 tablets (60 mg total) by mouth daily. 10/07/13  Yes Jodelle GrossKathryn M Lawrence, NP  glipiZIDE (GLUCOTROL) 10 MG tablet Take 10 mg by mouth 2 (two) times daily before a meal.    Yes Historical Provider, MD  metFORMIN (GLUCOPHAGE) 1000 MG tablet Take 1,000 mg by mouth 2 (two) times daily with a meal. 07/13/12  Yes Sherrie GeorgeWillard Jennings, PA-C  potassium chloride SA (K-DUR,KLOR-CON) 20 MEQ tablet Take 1 tablet (20 mEq total) by mouth 2 (two) times daily. 10/07/13  Yes Jodelle GrossKathryn M Lawrence, NP  Rivaroxaban (XARELTO) 20 MG TABS tablet Take 1 tablet (20 mg total) by mouth daily with supper. 10/07/13  Yes Jodelle GrossKathryn M Lawrence, NP  diltiazem (CARDIZEM CD) 240 MG 24 hr capsule Take 1 capsule (240 mg total) by mouth daily. 08/24/12   Nishant Dhungel, MD  nitroGLYCERIN (NITROSTAT) 0.4 MG SL  tablet Place 1 tablet (0.4 mg total) under the tongue every 5 (five) minutes x 3 doses as needed for chest pain. 10/07/13   Jodelle Gross, NP  ramipril (ALTACE) 2.5 MG capsule Take 1 capsule (2.5 mg total) by mouth 2 (two) times daily. 10/07/13   Jodelle Gross, NP    Inpatient Medications:    Allergies: No Known Allergies  History   Social History  . Marital Status: Single    Spouse Name: N/A    Number of Children: N/A  . Years of Education: N/A   Occupational History  . Not on file.   Social History Main Topics  . Smoking status: Never Smoker   . Smokeless tobacco: Never Used  . Alcohol Use: No      Comment: rarely  . Drug Use: No  . Sexual Activity: Yes   Other Topics Concern  . Not on file   Social History Narrative     No family history on file.   Review of Systems: General: negative for chills, fever, night sweats or weight changes.  Cardiovascular: see HPI Dermatological: negative for rash Respiratory: negative for cough or wheezing Urologic: negative for hematuria Abdominal: negative for nausea, vomiting, diarrhea, bright red blood per rectum, melena, or hematemesis Neurologic: negative for visual changes, syncope, or dizziness All other systems reviewed and are otherwise negative except as noted above.  Labs: No results for input(s): CKTOTAL, CKMB, TROPONINI in the last 72 hours. Lab Results  Component Value Date   WBC 6.6 07/08/2014   HGB 13.8 07/08/2014   HCT 41.8 07/08/2014   MCV 94.6 07/08/2014   PLT 214 07/08/2014    Recent Labs Lab 07/08/14 1714  NA 138  K 4.8  CL 102  CO2 23  BUN 14  CREATININE 1.01  CALCIUM 8.9  GLUCOSE 183*    Radiology/Studies:  Dg Chest 2 View  07/08/2014   CLINICAL DATA:  Chest pain.  EXAM: CHEST  2 VIEW  COMPARISON:  September 27, 2013.  FINDINGS: Stable cardiomegaly. No pneumothorax or pleural effusion is noted. No acute pulmonary disease is noted. Bony thorax is intact.  IMPRESSION: No acute cardiopulmonary abnormality seen.   Electronically Signed   By: Roque Lias M.D.   On: 07/08/2014 18:19    Physical Exam: Blood pressure 137/82, pulse 112, temperature 98.5 F (36.9 C), temperature source Oral, resp. rate 22, height 6\' 1"  (1.854 m), weight 209.25 kg (461 lb 5 oz), SpO2 100 %. General: Well developed, well nourished, in no acute distress. Obese Head: Normocephalic, atraumatic, sclera non-icteric, no xanthomas, nares are without discharge.  Neck: Supple. Negative for carotid bruits. Unable to visualize due to habitus Lungs: Clear bilaterally to auscultation without wheezes, rales, or rhonchi. Breathing is  unlabored. Heart: RRR with S1 S2. No murmurs, rubs, or gallops appreciated. Abdomen: Soft, non-tender, non-distended with normoactive bowel sounds. No hepatomegaly. No rebound/guarding. No obvious abdominal masses. Msk:  Strength and tone appear normal for age. Extremities: +2-3 pitting edema to mid thigh Neuro: Alert and oriented X 3. Moves all extremities spontaneously. Psych:  Responds to questions appropriately with a normal affect.   Problem List 1. Acute on chronic diastolic and systolic heart failure, mild 2. Profound obesity 3. DM2 4. Afib 5. Med compliance issues  Assessment and Plan:  48 y.o. male w/ PMHx significant for atrial fibrillation, morbid obesity, likely mixed diastolic and systolic CHF (EF 29%) who presented to [facility] on 07/09/2014 with complaints of worsening LE edema and dyspnea  on exertion.  His symptoms, weight gain, exam and labs are consistent with mild CHF with R sided symptoms being the most prominent. However, he is ambulatory and oxygenating well. He also has responded well to the lasix dosing in the ER so it is reasonable to attempt an outpatient trial with increased diuretics and follow up labs.   Unclear trigger though med compliance could be playing a role (he denies). His dosing of TID lasix is also less effective and he should take larger doses in qday or BID timing. Plan at this time is to jumpstart the diuresis with 60 BID lasix x 3 days with similarly double his KCl dosing for 3 days and then resume 60 qday. Daily weights emphasized to help monitor improvement. Needs labs to be checked at the end of this week and follow up with cardiology ensure improvement.   Continue his regimen as prescribed.  Will arrange for cardiology followup.  Signed, Ann Bohne C. MD 07/09/2014, 1:13 AM

## 2014-07-09 NOTE — Discharge Instructions (Signed)
Please read and follow all provided instructions.  Your diagnoses today include:   Bilateral leg swelling  Congestive heart failure   Tests performed today include:  An EKG of your heart  A chest x-ray - no fluid build-up  Cardiac enzymes - a blood test for heart muscle damage  Blood counts and electrolytes  Vital signs. See below for your results today.   Medications prescribed:   None  Take any prescribed medications only as directed.  Follow-up instructions: Please follow-up with your primary care provider as soon as you can for further evaluation of your symptoms.   For the next three days, on Tuesday, Wednesday, and Thursday -- double your Lasix dose. You should be taking 120mg  once a day. Also double your potassium dose. You should be taking twice a day.   Follow-up with your cardiology office this week.   Return instructions:  SEEK IMMEDIATE MEDICAL ATTENTION IF:  You have severe chest pain, especially if the pain is crushing or pressure-like and spreads to the arms, back, neck, or jaw, or if you have sweating, nausea (feeling sick to your stomach), or shortness of breath. THIS IS AN EMERGENCY. Don't wait to see if the pain will go away. Get medical help at once. Call 911 or 0 (operator). DO NOT drive yourself to the hospital.   Your chest pain gets worse and does not go away with rest.   You have an attack of chest pain lasting longer than usual, despite rest and treatment with the medications your caregiver has prescribed.   You have worsening shortness of breath.  You feel dizzy or faint.  You have chest pain not typical of your usual pain for which you originally saw your caregiver.   You have any other emergent concerns regarding your health.  Your vital signs today were: BP 137/82 mmHg   Pulse 112   Temp(Src) 98.5 F (36.9 C) (Oral)   Resp 22   Ht 6\' 1"  (1.854 m)   Wt 461 lb 5 oz (209.25 kg)   BMI 60.88 kg/m2   SpO2 100% If your blood pressure  (BP) was elevated above 135/85 this visit, please have this repeated by your doctor within one month. --------------

## 2014-07-11 ENCOUNTER — Ambulatory Visit (INDEPENDENT_AMBULATORY_CARE_PROVIDER_SITE_OTHER): Payer: Medicare Other | Admitting: Cardiovascular Disease

## 2014-07-11 ENCOUNTER — Encounter: Payer: Self-pay | Admitting: Cardiovascular Disease

## 2014-07-11 VITALS — BP 120/70 | HR 66 | Ht 73.0 in | Wt >= 6400 oz

## 2014-07-11 DIAGNOSIS — I5041 Acute combined systolic (congestive) and diastolic (congestive) heart failure: Secondary | ICD-10-CM

## 2014-07-11 DIAGNOSIS — I480 Paroxysmal atrial fibrillation: Secondary | ICD-10-CM

## 2014-07-11 DIAGNOSIS — R600 Localized edema: Secondary | ICD-10-CM

## 2014-07-11 NOTE — Assessment & Plan Note (Signed)
Maurice Nguyen presents with chronic combined systolic and diastolic congestive heart failure. He was recently seen in the emergency room with acute exacerbation of his heart failure. He's much  better after taking double dose Lasix for a couple days. He had run out of his medications for 3 or 4 months.  I stressed the importance of continuing his medications.  We also talked about the importance of leg elecvation, compression hose , and weight loss.  I've given info about the lounge doctor leg rest.   I will see him in 3 months

## 2014-07-11 NOTE — Patient Instructions (Addendum)
Your physician recommends that you continue on your current medications as directed. Please refer to the Current Medication list given to you today.  Your physician recommends that you schedule a follow-up appointment in: 3 months with Dr. Elease Hashimoto with lab work (BMET)   For your  leg edema you  should do  the following 1. Leg elevation - I recommend the Lounge Dr. Leg rest.  See below for details  2. Salt restriction  -  Use potassium chloride instead of regular salt as a salt substitute. 3. Walk regularly 4. Compression hose - [facility] [employer] [facility] 5. Weight loss     Go to [email or web address]

## 2014-07-11 NOTE — Progress Notes (Signed)
Maurice Nguyen Date of Birth: 09-28-1965 Medical Record #[id number]  History of Present Illness: [name] is seen back today for a post hospital visit.  - he was siiin  in consultation back in 2013. He is a 48 year old male with PAF, mixed CHF with systolic dysfunction, DM, chronic back pain, morbid obesity, HTN and non compliance. CHADSVAS of 3 (DM, HTN, CHF) with 3.2% estimated annual stroke risk.     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 Last seen by Dr. Excell Seltzer in January of 2014.   Most recently presented to [facility] with worsening edema and DOE. Noted to be in AF with RVR. Had ran out of his Lasix. His AF was difficult to control - now on CCB and high dose BB therapy. On xarelto. Diuresed down from 460 to 444.   Comes in today. Here with his girlfriend. Says he is feeling "great". Says his swelling has improved. Says he has scales at home - weight 444 to 447 at home. No chest pain. Tries to watch his salt. Not short of breath. Mostly limited by back pain. Says he can afford his medicine.   Dec. 17, 2015:  [name] is a 48 yo with hx of morbid obesity, Atrial fib, ruptured appendix.  I met him at [facility] in 2013.  This is his first OV with me. He had run out of his meds. Presented to the ER with shortness of breath.  Was restarted on lasix.  Has lost 14 lbs over the past 3 days.  Exercising some.   Has had leg swelling.  Was difficult for him to walk.    Echo in March, 2015: TDS: pooracoustic windows, poor sound wave transmission, and body habitus. Intravenous contrast (Definity) was administered. - Left ventricle: The cavity size was normal. There was moderate concentric hypertrophy. Systolic function was mildly reduced. The estimated ejection fraction was in the range of 45% to 50%. Definity contrast was administered which did not aid in wall motion or EF determination. The study is not technically  sufficient to allow evaluation of LV diastolic function. - Mitral valve: Poorly visualized. Mild regurgitation. - Left atrium: The atrium was mildly dilated. - Inferior vena cava: The vessel was dilated; the respirophasic diameter changes were blunted (< 50%); findings are consistent with elevated central venous pressure. - Pericardium, extracardiac: There was no pericardial effusion.    He is doing well.     Current Outpatient Prescriptions  Medication Sig Dispense Refill  . furosemide (LASIX) 20 MG tablet Take 3 tablets (60 mg total) by mouth daily. 90 tablet 6  . glipiZIDE (GLUCOTROL) 10 MG tablet Take 10 mg by mouth 2 (two) times daily before a meal.     . metFORMIN (GLUCOPHAGE) 1000 MG tablet Take 1,000 mg by mouth 2 (two) times daily with a meal.    . nitroGLYCERIN (NITROSTAT) 0.4 MG SL tablet Place 1 tablet (0.4 mg total) under the tongue every 5 (five) minutes x 3 doses as needed for chest pain. 30 tablet 12  . potassium chloride SA (K-DUR,KLOR-CON) 20 MEQ tablet Take 1 tablet (20 mEq total) by mouth 2 (two) times daily. 60 tablet 6  . ramipril (ALTACE) 2.5 MG capsule Take 1 capsule (2.5 mg total) by mouth 2 (two) times daily. 30 capsule 6  . Rivaroxaban (XARELTO) 20 MG TABS tablet Take 1 tablet (20 mg total) by mouth daily with supper. 30 tablet 6  . carvedilol (  COREG) 12.5 MG tablet Take 3 tablets (37.5 mg total) by mouth 2 (two) times daily with a meal. (Patient not taking: Reported on 07/11/2014) 180 tablet 6  . diltiazem (CARDIZEM CD) 240 MG 24 hr capsule Take 1 capsule (240 mg total) by mouth daily. (Patient not taking: Reported on 07/11/2014) 30 capsule 0   No current facility-administered medications for this visit.    No Known Allergies  Past Medical History  Diagnosis Date  . Diabetes mellitus   . Morbid obesity   . Lumbar herniated disc   . 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 Obesity, morbid, BMI 50 or higher 07/13/2012  . Hypertension 07/13/2012  .  Diabetes mellitus 07/13/2012  . Back pain, chronic 07/13/2012  . Complication of anesthesia     " THEY LOST ME AND BROUGHT ME BACK "  . CHF (congestive heart failure)   . Irregular heart beat   . Shortness of breath   . Anticoagulated, on Xarelto, Chads2Vas score 2 10/02/2013    Past Surgical History  Procedure Laterality Date  . Abscess drainage    . Dental surgery    . Colonoscopy  08/22/2012    Procedure: COLONOSCOPY;  Surgeon: Lear Ng, MD;  Location: [facility];  Service: Endoscopy;  Laterality: N/A;    History  Smoking status  . Never Smoker   Smokeless tobacco  . Never Used    History  Alcohol Use No    Comment: rarely    No family history on file.  Review of Systems: The review of systems is per the HPI.  All other systems were reviewed and are negative.  Physical Exam: Ht '6\' 1"'  (1.854 m)  Wt 442 lb 12.8 oz (200.853 kg)  BMI 58.43 kg/m2 Patient is massively obese but in no acute distress. Skin is warm and dry. Color is normal.  HEENT is unremarkable. Normocephalic/atraumatic. PERRL. Sclera are nonicteric. Neck is supple. No masses. No JVD. Lungs are clear. Cardiac exam shows an irregular rhythm. Rate is ok. Heart tones quite distant. Abdomen is soft. Extremities are quite full with 1+ edema. Gait and ROM are intact. No gross neurologic deficits noted.   Wt Readings from Last 3 Encounters:  07/11/14 442 lb 12.8 oz (200.853 kg)  07/08/14 461 lb 5 oz (209.25 kg)  10/29/13 447 lb 6.4 oz (202.939 kg)     LABORATORY DATA: EKG today with AF with rate of 106.    Lab Results  Component Value Date   WBC 6.6 07/08/2014   HGB 13.8 07/08/2014   HCT 41.8 07/08/2014   PLT 214 07/08/2014   GLUCOSE 183* 07/08/2014   CHOL  02/11/2010    159        ATP III CLASSIFICATION:  <200     mg/dL   Desirable  200-239  mg/dL   Borderline High  >=240    mg/dL   High          TRIG 58 02/11/2010   HDL 59 02/11/2010   LDLCALC  02/11/2010    88        Total  Cholesterol/HDL:CHD Risk Coronary Heart Disease Risk Table                     Men   Women  1/2 Average Risk   3.4   3.3  Average Risk       5.0   4.4  2 X Average Risk   9.6   7.1  3 X Average Risk  23.4   11.0        Use the calculated Patient Ratio above and the CHD Risk Table to determine the patient's CHD Risk.        ATP III CLASSIFICATION (LDL):  <100     mg/dL   Optimal  100-129  mg/dL   Near or Above                    Optimal  130-159  mg/dL   Borderline  160-189  mg/dL   High  >190     mg/dL   Very High   ALT 30 09/28/2013   AST 16 09/28/2013   NA 138 07/08/2014   K 4.8 07/08/2014   CL 102 07/08/2014   CREATININE 1.01 07/08/2014   BUN 14 07/08/2014   CO2 23 07/08/2014   TSH 2.421 09/27/2013   PSA 0.22 Test Methodology: [employer] PSA 02/11/2010   INR 1.04 09/27/2013   HGBA1C 7.1* 09/27/2013   MICROALBUR 0.99 02/11/2010     Echo Study Conclusions from March 2015  - Procedure narrative: Transthoracic echocardiography. The study was technically difficult, as a result of poor acoustic windows, poor sound wave transmission, and body habitus. Intravenous contrast (Definity) was administered. - Left ventricle: The cavity size was normal. There was moderate concentric hypertrophy. Systolic function was mildly reduced. The estimated ejection fraction was in the range of 45% to 50%. Definity contrast was administered which did not aid in wall motion or EF determination. The study is not technically sufficient to allow evaluation of LV diastolic function. - Mitral valve: Poorly visualized. Mild regurgitation. - Left atrium: The atrium was mildly dilated. - Inferior vena cava: The vessel was dilated; the respirophasic diameter changes were blunted (< 50%); findings are consistent with elevated central venous pressure. - Pericardium, extracardiac: There was no pericardial effusion.   Assessment / Plan:

## 2014-07-30 ENCOUNTER — Other Ambulatory Visit: Payer: Self-pay | Admitting: Adult Health

## 2014-08-05 ENCOUNTER — Other Ambulatory Visit: Payer: Self-pay | Admitting: Adult Health

## 2014-09-29 IMAGING — CT CT ABD-PELV W/ CM
2 of 5 series · 17 of 46 positions shown, 19 images · IV contrast (CONTRAST)
Comparison: Prior examinations 07/07/2012 and 12/30/2010.

CLINICAL DATA: Ruptured appendicitis on antibiotics.  Low abdominal
pain.

CT ABDOMEN AND PELVIS WITH CONTRAST
TECHNIQUE: Multidetector CT imaging of the abdomen and pelvis was
performed following the standard protocol during bolus
administration of intravenous contrast.
Contrast: 100mL OMNIPAQUE IOHEXOL 300 MG/ML  SOLN

[Series 2: routine · axial · 0.96mm/px · z∈[-466,-56]mm · 14 of 94 slices shown, 16 images]
[im 6/94  soft-tissue]
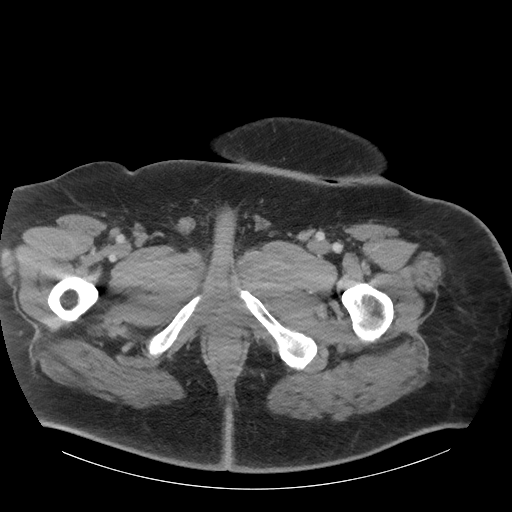
[im 6/94  bone]
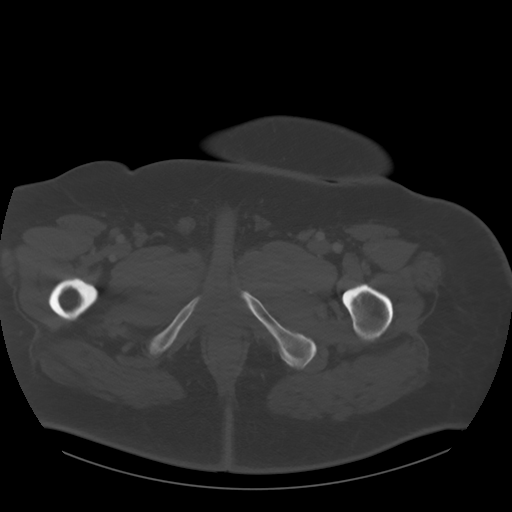
[im 11/94  soft-tissue]
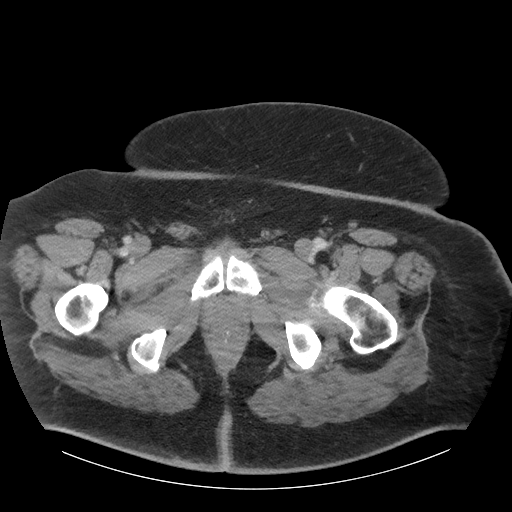
[im 21/94  soft-tissue]
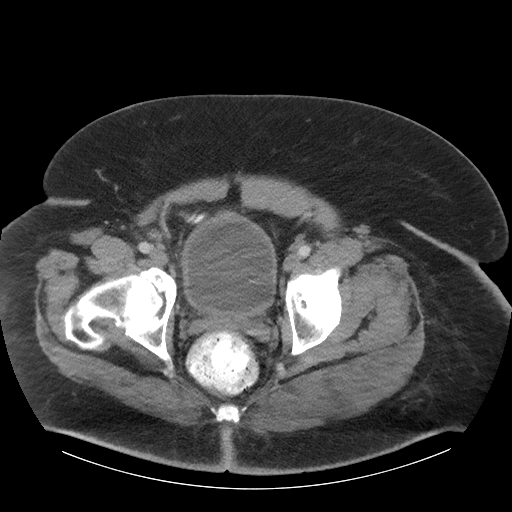
[im 26/94  soft-tissue]
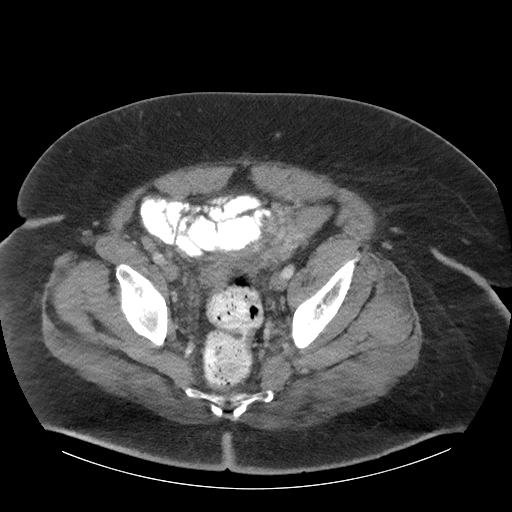
[im 32/94  soft-tissue]
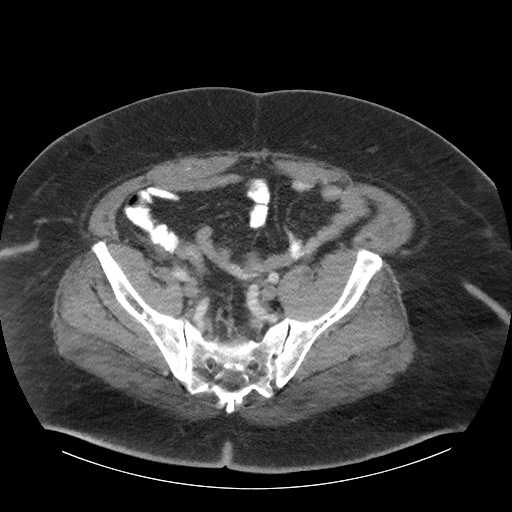
[im 37/94  soft-tissue]
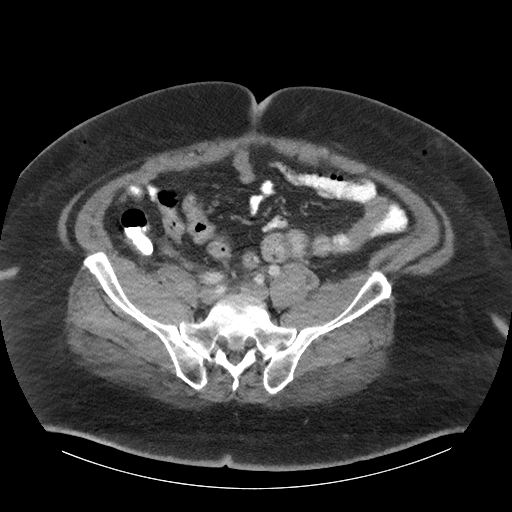
[im 42/94  soft-tissue]
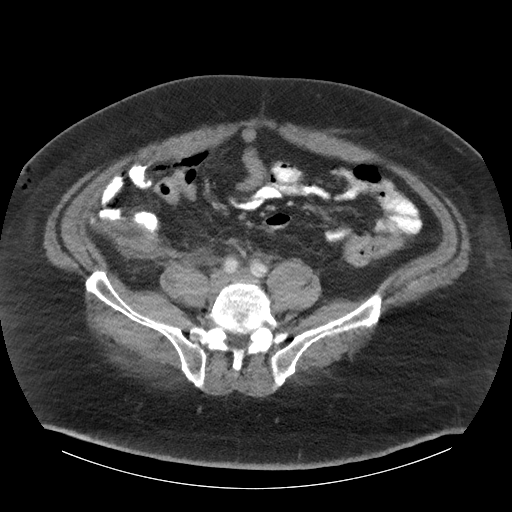
[im 52/94  soft-tissue]
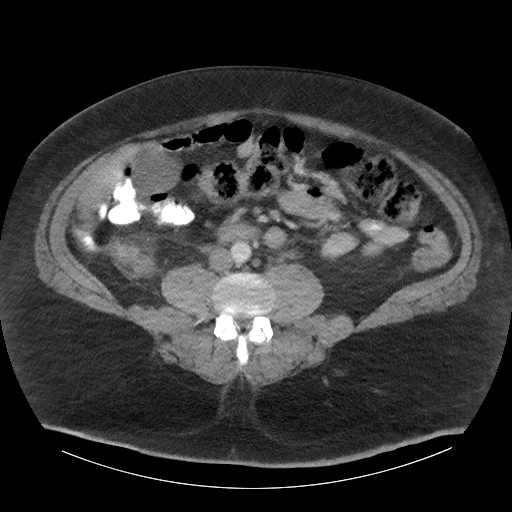
[im 57/94  soft-tissue]
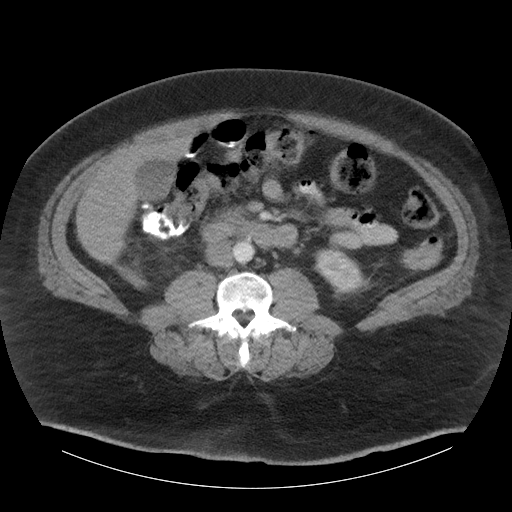
[im 57/94  bone]
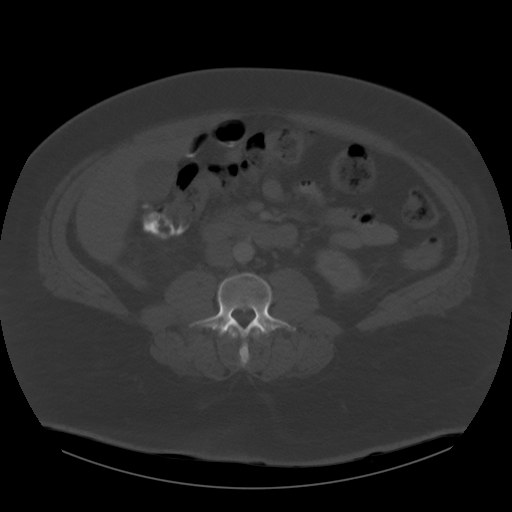
[im 63/94  soft-tissue]
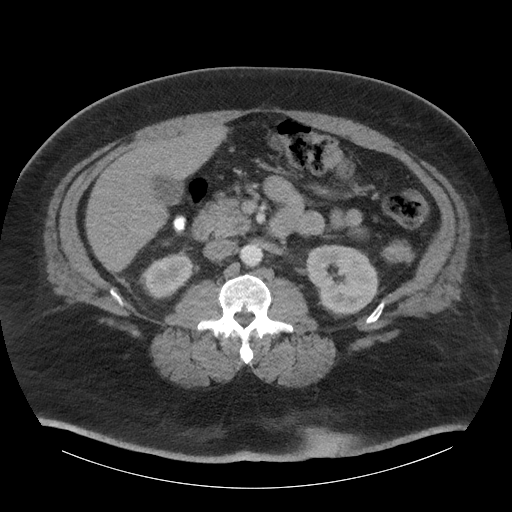
[im 68/94  soft-tissue]
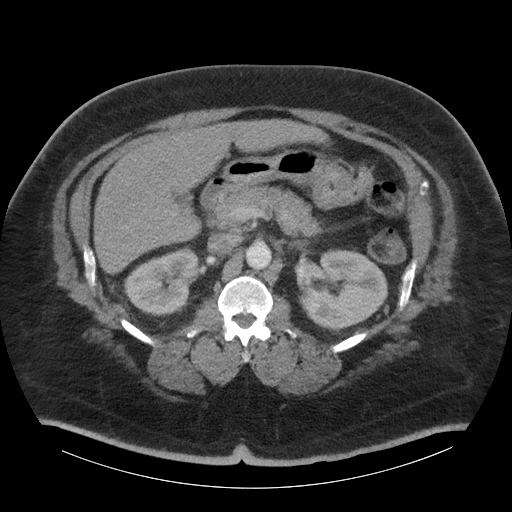
[im 73/94  soft-tissue]
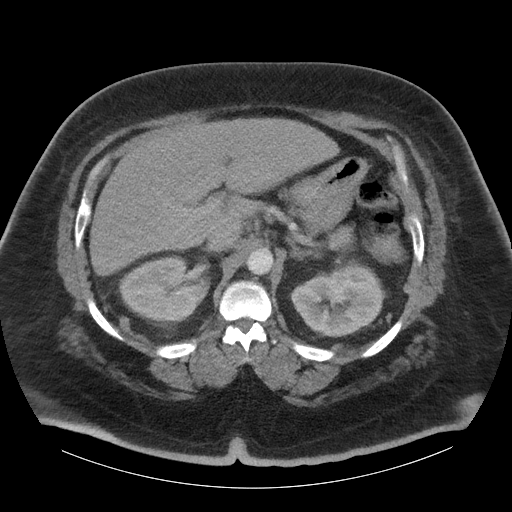
[im 83/94  soft-tissue]
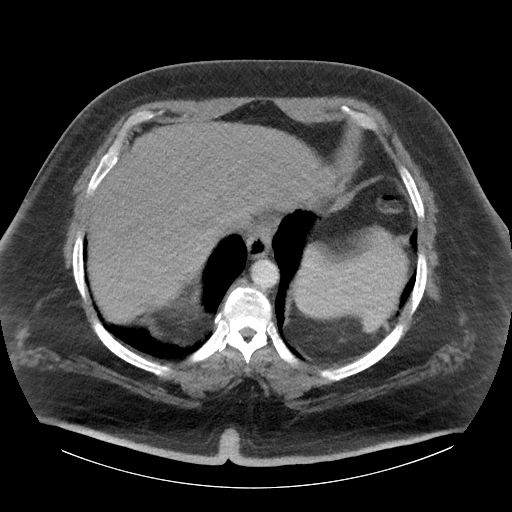
[im 88/94  soft-tissue]
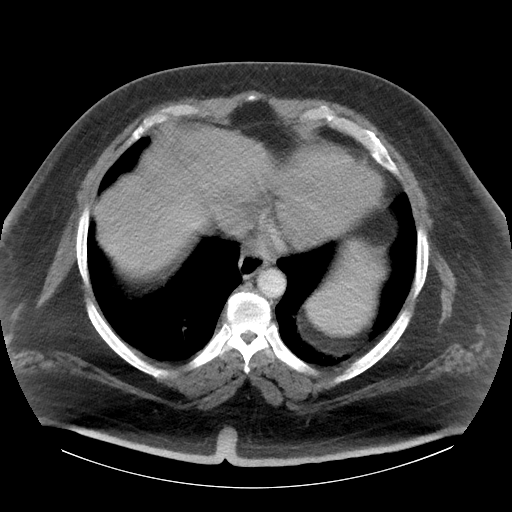

[mpr, coronals, coronal · coronal · 0.97mm/px · 3 of 128 slices shown]
[im 43/128  soft-tissue]
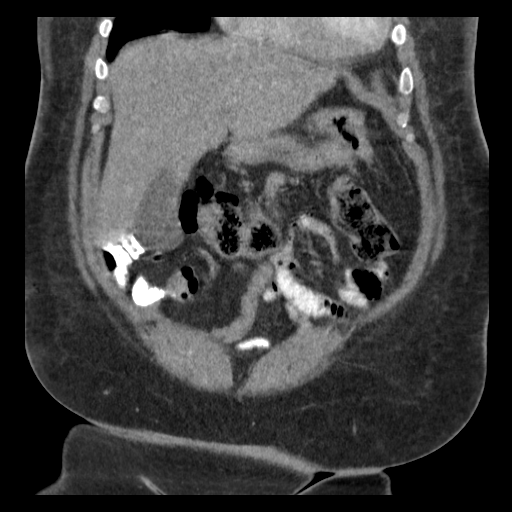
[im 57/128  soft-tissue]
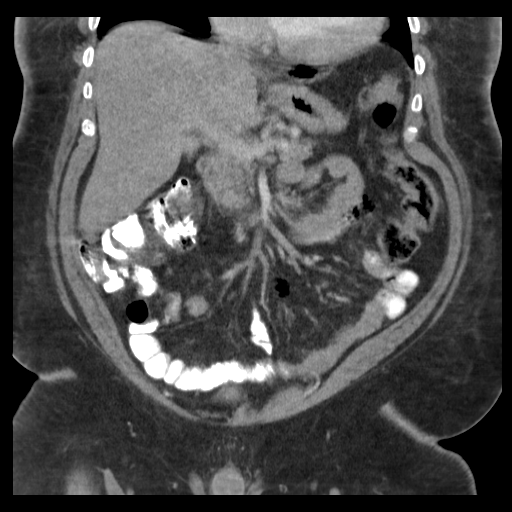
[im 71/128  soft-tissue]
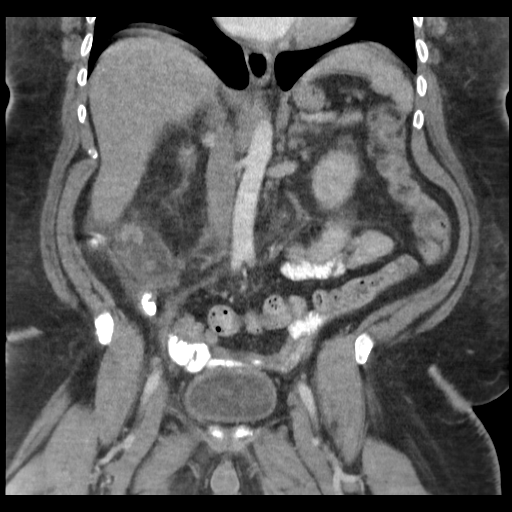

[17 of 46 positions shown; findings below may reference images not displayed]

FINDINGS: Study remains mildly limited by body habitus.  There is
stable mild atelectasis at both lung bases.  There is a small
Bochdalek hernia on the right containing only fat.  No significant
pleural effusion is present.

The liver, gallbladder, pancreas, biliary system and spleen appear
normal.  There is no adrenal mass.  There is a stable small cyst in
the mid left kidney.  The right kidney appears normal.

The right lower quadrant retrocecal inflammatory process does not
appear significantly changed.  There is no extravasated enteric
contrast or drainable fluid collection.  The soft tissue stranding
extending more inferiorly along the right psoas muscle has
improved.  There are no new inflammatory changes or fluid
collections.  There is no evidence of bowel obstruction. The distal
rectum is poorly distended; the rectal wall thickening is difficult
to exclude.

The bladder, prostate gland and seminal vesicles appear normal.
There are no worrisome osseous findings.
IMPRESSION: 1.  No significant change in the retrocecal inflammatory process
consistent with ruptured appendicitis.  No drainable abscess
identified.
2.  No evidence of bowel obstruction or extravasated enteric
contrast.
3.  Cannot exclude distal rectal wall thickening.  Correlation with
digital rectal examination recommended.

## 2014-10-10 ENCOUNTER — Other Ambulatory Visit: Payer: Medicare Other

## 2014-10-10 ENCOUNTER — Ambulatory Visit: Payer: Medicare Other | Admitting: Cardiovascular Disease

## 2014-11-07 ENCOUNTER — Other Ambulatory Visit (HOSPITAL_COMMUNITY): Payer: Self-pay

## 2014-11-07 ENCOUNTER — Emergency Department (HOSPITAL_COMMUNITY): Payer: Medicare Other

## 2014-11-07 ENCOUNTER — Encounter (HOSPITAL_COMMUNITY): Payer: Self-pay | Admitting: *Deleted

## 2014-11-07 ENCOUNTER — Inpatient Hospital Stay (HOSPITAL_COMMUNITY)
Admission: EM | Admit: 2014-11-07 | Discharge: 2014-11-11 | DRG: 638 | Disposition: A | Discharge status: Home / Self Care | Payer: Medicare Other | Attending: Family Medicine | Admitting: Family Medicine

## 2014-11-07 DIAGNOSIS — E11621 Type 2 diabetes mellitus with foot ulcer: Secondary | ICD-10-CM | POA: Diagnosis not present

## 2014-11-07 DIAGNOSIS — I482 Chronic atrial fibrillation: Secondary | ICD-10-CM | POA: Diagnosis present

## 2014-11-07 DIAGNOSIS — I878 Other specified disorders of veins: Secondary | ICD-10-CM | POA: Diagnosis present

## 2014-11-07 DIAGNOSIS — Z7901 Long term (current) use of anticoagulants: Secondary | ICD-10-CM

## 2014-11-07 DIAGNOSIS — I10 Essential (primary) hypertension: Secondary | ICD-10-CM | POA: Diagnosis present

## 2014-11-07 DIAGNOSIS — L03115 Cellulitis of right lower limb: Secondary | ICD-10-CM

## 2014-11-07 DIAGNOSIS — Z6841 Body Mass Index (BMI) 40.0 and over, adult: Secondary | ICD-10-CM

## 2014-11-07 DIAGNOSIS — R0602 Shortness of breath: Secondary | ICD-10-CM | POA: Diagnosis not present

## 2014-11-07 DIAGNOSIS — L0390 Cellulitis, unspecified: Secondary | ICD-10-CM | POA: Diagnosis present

## 2014-11-07 DIAGNOSIS — I5040 Unspecified combined systolic (congestive) and diastolic (congestive) heart failure: Secondary | ICD-10-CM | POA: Diagnosis present

## 2014-11-07 LAB — I-STAT TROPONIN, ED: Troponin i, poc: 0.01 ng/mL (ref 0.00–0.08)

## 2014-11-07 LAB — COMPREHENSIVE METABOLIC PANEL
ALT: 21 U/L (ref 0–53)
ALT: 23 U/L (ref 0–53)
AST: 25 U/L (ref 0–37)
AST: 26 U/L (ref 0–37)
Albumin: 3.5 g/dL (ref 3.5–5.2)
Albumin: 3.8 g/dL (ref 3.5–5.2)
Alkaline Phosphatase: 53 U/L (ref 39–117)
Alkaline Phosphatase: 57 U/L (ref 39–117)
Anion gap: 9 (ref 5–15)
Anion gap: 12 (ref 5–15)
BUN: 17 mg/dL (ref 6–23)
BUN: 16 mg/dL (ref 6–23)
CO2: 27 mmol/L (ref 19–32)
CO2: 27 mmol/L (ref 19–32)
Calcium: 8.8 mg/dL (ref 8.4–10.5)
Calcium: 8.7 mg/dL (ref 8.4–10.5)
Chloride: 100 mmol/L (ref 96–112)
Chloride: 97 mmol/L (ref 96–112)
Creatinine, Ser: 1.36 mg/dL — ABNORMAL HIGH (ref 0.50–1.35)
Creatinine, Ser: 1.38 mg/dL — ABNORMAL HIGH (ref 0.50–1.35)
GFR calc Af Amer: 69 mL/min — ABNORMAL LOW (ref >90)
GFR calc Af Amer: 67 mL/min — ABNORMAL LOW (ref >90)
GFR calc non Af Amer: 59 mL/min — ABNORMAL LOW (ref >90)
GFR calc non Af Amer: 58 mL/min — ABNORMAL LOW (ref >90)
Glucose, Bld: 249 mg/dL — ABNORMAL HIGH (ref 70–99)
Glucose, Bld: 230 mg/dL — ABNORMAL HIGH (ref 70–99)
Potassium: 4.7 mmol/L (ref 3.5–5.1)
Potassium: 4.3 mmol/L (ref 3.5–5.1)
Sodium: 136 mmol/L (ref 135–145)
Sodium: 136 mmol/L (ref 135–145)
Total Bilirubin: 1.1 mg/dL (ref 0.3–1.2)
Total Bilirubin: 1.4 mg/dL — ABNORMAL HIGH (ref 0.3–1.2)
Total Protein: 7.1 g/dL (ref 6.0–8.3)
Total Protein: 7.6 g/dL (ref 6.0–8.3)

## 2014-11-07 LAB — CBC
HCT: 42.5 % (ref 39.0–52.0)
Hemoglobin: 13.8 g/dL (ref 13.0–17.0)
MCH: 31.3 pg (ref 26.0–34.0)
MCHC: 32.5 g/dL (ref 30.0–36.0)
MCV: 96.4 fL (ref 78.0–100.0)
Platelets: 228 10*3/uL (ref 150–400)
RBC: 4.41 MIL/uL (ref 4.22–5.81)
RDW: 14.1 % (ref 11.5–15.5)
WBC: 7.2 10*3/uL (ref 4.0–10.5)

## 2014-11-07 LAB — SEDIMENTATION RATE: Sed Rate: 11 mm/hr (ref 0–16)

## 2014-11-07 LAB — CBG MONITORING, ED: Glucose-Capillary: 218 mg/dL — ABNORMAL HIGH (ref 70–99)

## 2014-11-07 LAB — BRAIN NATRIURETIC PEPTIDE: B Natriuretic Peptide: 489 pg/mL — ABNORMAL HIGH (ref 0.0–100.0)

## 2014-11-07 MED ORDER — OXYCODONE HCL 5 MG PO TABS
5.0000 mg | ORAL_TABLET | ORAL | Status: DC | PRN
  Administered 2014-11-08 – 2014-11-09 (×6): 5 mg via ORAL
  Filled 2014-11-07 (×6): qty 1

## 2014-11-07 MED ORDER — RAMIPRIL 2.5 MG PO CAPS
2.5000 mg | ORAL_CAPSULE | Freq: Two times a day (BID) | ORAL | Status: DC
  Administered 2014-11-07 – 2014-11-11 (×8): 2.5 mg via ORAL
  Filled 2014-11-07 (×11): qty 1

## 2014-11-07 MED ORDER — DILTIAZEM HCL ER COATED BEADS 120 MG PO CP24
120.0000 mg | ORAL_CAPSULE | Freq: Every day | ORAL | Status: DC
  Administered 2014-11-07 – 2014-11-11 (×5): 120 mg via ORAL
  Filled 2014-11-07 (×6): qty 1

## 2014-11-07 MED ORDER — KETOROLAC TROMETHAMINE 30 MG/ML IJ SOLN: 30.0000 mg | Freq: Once | INTRAMUSCULAR | Status: DC

## 2014-11-07 MED ORDER — SODIUM CHLORIDE 0.9 % IV SOLN
INTRAVENOUS | Status: DC
  Administered 2014-11-07: 21:00:00 via INTRAVENOUS

## 2014-11-07 MED ORDER — INSULIN ASPART 100 UNIT/ML ~~LOC~~ SOLN
0.0000 [IU] | SUBCUTANEOUS | Status: DC
  Administered 2014-11-07 – 2014-11-08 (×2): 3 [IU] via SUBCUTANEOUS
  Administered 2014-11-08: 2 [IU] via SUBCUTANEOUS
  Administered 2014-11-08: 1 [IU] via SUBCUTANEOUS
  Administered 2014-11-08 (×2): 3 [IU] via SUBCUTANEOUS
  Administered 2014-11-08: 2 [IU] via SUBCUTANEOUS
  Administered 2014-11-09 (×2): 3 [IU] via SUBCUTANEOUS
  Administered 2014-11-09: 5 [IU] via SUBCUTANEOUS
  Administered 2014-11-09: 1 [IU] via SUBCUTANEOUS
  Administered 2014-11-09 (×2): 2 [IU] via SUBCUTANEOUS
  Administered 2014-11-10: 5 [IU] via SUBCUTANEOUS
  Administered 2014-11-10 (×2): 3 [IU] via SUBCUTANEOUS
  Administered 2014-11-10: 1 [IU] via SUBCUTANEOUS
  Administered 2014-11-10: 3 [IU] via SUBCUTANEOUS
  Administered 2014-11-10: 2 [IU] via SUBCUTANEOUS
  Administered 2014-11-11: 3 [IU] via SUBCUTANEOUS
  Administered 2014-11-11: 2 [IU] via SUBCUTANEOUS
  Administered 2014-11-11: 3 [IU] via SUBCUTANEOUS
  Administered 2014-11-11: 2 [IU] via SUBCUTANEOUS
  Administered 2014-11-11: 3 [IU] via SUBCUTANEOUS
  Filled 2014-11-07: qty 1

## 2014-11-07 MED ORDER — AMOXICILLIN-POT CLAVULANATE 875-125 MG PO TABS
1.0000 | ORAL_TABLET | Freq: Two times a day (BID) | ORAL | Status: DC
  Administered 2014-11-07 – 2014-11-08 (×2): 1 via ORAL
  Filled 2014-11-07 (×2): qty 1

## 2014-11-07 MED ORDER — NITROGLYCERIN 0.4 MG SL SUBL: 0.4000 mg | SUBLINGUAL_TABLET | SUBLINGUAL | Status: DC | PRN

## 2014-11-07 MED ORDER — RIVAROXABAN 20 MG PO TABS
20.0000 mg | ORAL_TABLET | Freq: Every day | ORAL | Status: DC
  Administered 2014-11-08 – 2014-11-11 (×4): 20 mg via ORAL
  Filled 2014-11-07 (×4): qty 1

## 2014-11-07 MED ORDER — VANCOMYCIN HCL IN DEXTROSE 1-5 GM/200ML-% IV SOLN
1000.0000 mg | Freq: Once | INTRAVENOUS | Status: AC
  Administered 2014-11-07: 1000 mg via INTRAVENOUS
  Filled 2014-11-07: qty 200

## 2014-11-07 MED ORDER — FUROSEMIDE 10 MG/ML IJ SOLN
40.0000 mg | INTRAMUSCULAR | Status: AC
  Administered 2014-11-07: 40 mg via INTRAVENOUS
  Filled 2014-11-07: qty 4

## 2014-11-07 MED ORDER — KETOROLAC TROMETHAMINE 30 MG/ML IJ SOLN
30.0000 mg | Freq: Once | INTRAMUSCULAR | Status: AC
  Administered 2014-11-07: 30 mg via INTRAVENOUS

## 2014-11-07 MED ORDER — HEPARIN SODIUM (PORCINE) 5000 UNIT/ML IJ SOLN: 5000.0000 [IU] | Freq: Three times a day (TID) | INTRAMUSCULAR | Status: DC

## 2014-11-07 NOTE — ED Notes (Signed)
Ordered heart healthy meal tray for pt. 

## 2014-11-07 NOTE — ED Provider Notes (Signed)
CSN: [id number]     Arrival date & time 11/07/14  1720 History   First MD Initiated Contact with Patient 11/07/14 1751     Chief Complaint  Patient presents with  . Shortness of Breath     (Consider location/radiation/quality/duration/timing/severity/associated sxs/prior Treatment) HPI Comments: 49 year old male, history of congestive heart failure, diabetes and chronic atrial fibrillation, on Xarelto. He presents to the hospital with 2 complaints  #1 shortness of breath - he states that the shortness breath has been waxing and waning but recently has been much worse. He does have orthopnea, he has significant peripheral edema, this is gradually worsening and is now severe. He denies coughing or fevers.  #2 swelling of the legs and rash - he states that his legs have been peripherally swollen, he has had erythema and draining wounds to his right lower extremity after developing a blisters after an injury. He denies fevers or chills but has significant pain when touching his legs, difficulty with ambulating because of burning pain and ongoing drainage from both wounds both clear and purulent.  Patient is a 49 y.o. male presenting with shortness of breath. The history is provided by the patient.  Shortness of Breath   Past Medical History  Diagnosis Date  . Diabetes mellitus   . Morbid obesity   . Lumbar herniated disc   . 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 Obesity, morbid, BMI 50 or higher 07/13/2012  . Hypertension 07/13/2012  . Diabetes mellitus 07/13/2012  . Back pain, chronic 07/13/2012  . Complication of anesthesia     " THEY LOST ME AND BROUGHT ME BACK "  . CHF (congestive heart failure)   . Irregular heart beat   . Shortness of breath   . Anticoagulated, on Xarelto, Chads2Vas score 2 10/02/2013   Past Surgical History  Procedure Laterality Date  . Abscess drainage    . Dental surgery    . Colonoscopy  08/22/2012    Procedure: COLONOSCOPY;  Surgeon: Shirley Friar,  MD;  Location: [facility];  Service: Endoscopy;  Laterality: N/A;   No family history on file. History  Substance Use Topics  . Smoking status: Never Smoker   . Smokeless tobacco: Never Used  . Alcohol Use: No     Comment: rarely    Review of Systems  Respiratory: Positive for shortness of breath.   All other systems reviewed and are negative.     Allergies  Review of patient's allergies indicates no known allergies.  Home Medications   Prior to Admission medications   Medication Sig Start Date End Date Taking? Authorizing Provider  furosemide (LASIX) 20 MG tablet take 3 tablets by mouth once daily 08/06/14  Yes Vesta Mixer, MD  glipiZIDE (GLUCOTROL) 10 MG tablet Take 10 mg by mouth 2 (two) times daily before a meal.    Yes Historical Provider, MD  metFORMIN (GLUCOPHAGE) 1000 MG tablet Take 1,000 mg by mouth 2 (two) times daily with a meal. 07/13/12  Yes Sherrie George, PA-C  naproxen sodium (ANAPROX) 220 MG tablet Take 220 mg by mouth daily as needed (pain).   Yes Historical Provider, MD  nitroGLYCERIN (NITROSTAT) 0.4 MG SL tablet Place 1 tablet (0.4 mg total) under the tongue every 5 (five) minutes x 3 doses as needed for chest pain. 10/07/13  Yes Jodelle Gross, NP  potassium chloride SA (K-DUR,KLOR-CON) 20 MEQ tablet take 1 tablet by mouth twice a day 08/06/14  Yes Vesta Mixer, MD  ramipril (ALTACE) 2.5 MG capsule  take 1 capsule by mouth twice a day 08/06/14  Yes Vesta Mixer, MD  XARELTO 20 MG TABS tablet take 1 tablet by mouth once daily with SUPPER 07/31/14  Yes Vesta Mixer, MD  carvedilol (COREG) 12.5 MG tablet Take 3 tablets (37.5 mg total) by mouth 2 (two) times daily with a meal. Patient not taking: Reported on 07/11/2014 10/07/13   Jodelle Gross, NP  diltiazem (CARDIZEM CD) 240 MG 24 hr capsule Take 1 capsule (240 mg total) by mouth daily. Patient not taking: Reported on 07/11/2014 08/24/12   Nishant Dhungel, MD   BP 153/73 mmHg  Pulse 96   Temp(Src) 98.6 F (37 C) (Oral)  Resp 18  Ht 6\' 1"  (1.854 m)  Wt 454 lb (205.933 kg)  BMI 59.91 kg/m2  SpO2 99% Physical Exam  Constitutional: He appears well-developed and well-nourished. No distress.  HENT:  Head: Normocephalic and atraumatic.  Mouth/Throat: Oropharynx is clear and moist. No oropharyngeal exudate.  Eyes: Conjunctivae and EOM are normal. Pupils are equal, round, and reactive to light. Right eye exhibits no discharge. Left eye exhibits no discharge. No scleral icterus.  Neck: Normal range of motion. Neck supple. No JVD present. No thyromegaly present.  Cardiovascular: Normal heart sounds and intact distal pulses.  Exam reveals no gallop and no friction rub.   No murmur heard. Atrial fibrillation, pulse of 100-120  Pulmonary/Chest: Effort normal. No respiratory distress. He has no wheezes. He has rales ( Occasional scattered rales, no distress).  Abdominal: Soft. Bowel sounds are normal. He exhibits no distension and no mass. There is no tenderness.  Musculoskeletal: Normal range of motion. He exhibits edema (severe bilateral lower extremity pitting edema, symmetrical) and tenderness ( Tender to palpation over the bilateral lower extremities).  Lymphadenopathy:    He has no cervical adenopathy.  Neurological: He is alert. Coordination normal.  Skin: Skin is warm and dry. No rash noted. No erythema.  Psychiatric: He has a normal mood and affect. His behavior is normal.  Nursing note and vitals reviewed.   ED Course  Procedures (including critical care time) Labs Review Labs Reviewed  BRAIN NATRIURETIC PEPTIDE - Abnormal; Notable for the following:    B Natriuretic Peptide 489.0 (*)    All other components within normal limits  COMPREHENSIVE METABOLIC PANEL - Abnormal; Notable for the following:    Glucose, Bld 249 (*)    Creatinine, Ser 1.36 (*)    GFR calc non Af Amer 59 (*)    GFR calc Af Amer 69 (*)    All other components within normal limits  C-REACTIVE  PROTEIN - Abnormal; Notable for the following:    CRP 1.2 (*)    All other components within normal limits  COMPREHENSIVE METABOLIC PANEL - Abnormal; Notable for the following:    Glucose, Bld 230 (*)    Creatinine, Ser 1.38 (*)    Total Bilirubin 1.4 (*)    GFR calc non Af Amer 58 (*)    GFR calc Af Amer 67 (*)    All other components within normal limits  GLUCOSE, CAPILLARY - Abnormal; Notable for the following:    Glucose-Capillary 149 (*)    All other components within normal limits  GLUCOSE, CAPILLARY - Abnormal; Notable for the following:    Glucose-Capillary 151 (*)    All other components within normal limits  CBG MONITORING, ED - Abnormal; Notable for the following:    Glucose-Capillary 218 (*)    All other components within normal limits  CULTURE, BLOOD (ROUTINE X 2)  CULTURE, BLOOD (ROUTINE X 2)  CBC  SEDIMENTATION RATE  PREALBUMIN  HEMOGLOBIN A1C  HIV ANTIBODY (ROUTINE TESTING)  CBC WITH DIFFERENTIAL/PLATELET  Rosezena Sensor, ED    Imaging Review Dg Chest 2 View  11/07/2014   CLINICAL DATA:  Shortness of breath and leg pain. History of CHF. Increased bilateral lower extremity edema with right lower leg wound. Diabetes.  EXAM: CHEST  2 VIEW  COMPARISON:  07/08/2014  FINDINGS: Lordotic technique is demonstrated on the frontal exam. Lungs are adequately inflated without focal consolidation or effusion. Mild stable cardiomegaly. Remainder the exam is unchanged.  IMPRESSION: No active cardiopulmonary disease.  Mild stable cardiomegaly.   Electronically Signed   By: Elberta Fortis M.D.   On: 11/07/2014 19:50     EKG Interpretation   Date/Time:  Thursday November 07 2014 17:30:00 EDT Ventricular Rate:  118 PR Interval:    QRS Duration: 80 QT Interval:  334 QTC Calculation: 468 R Axis:   -59 Text Interpretation:  Atrial fibrillation with rapid ventricular response  with premature ventricular or aberrantly conducted complexes Left axis  deviation Inferior infarct , age  undetermined Anteroseptal infarct , age  undetermined Abnormal ECG since last tracing no significant change  Confirmed by Hyacinth Meeker  MD, Romello Hoehn ([id number]) on 11/07/2014 5:48:31 PM      MDM   Final diagnoses:  Cellulitis of right leg  Shortness of breath    Labs show tht the pt has elevated BNP, he has no sig leukocytosis and CXR without obvious infiltrate His legs however have open weeping wounds and the appearnace of cellulities -   Due to severe edema and extent of erythema, pt will be admitted to hospital for IV abx and diuresis.  D/w hospitalist.  Eber Hong, MD 11/08/14 [phone]

## 2014-11-07 NOTE — H&P (Signed)
Hospitalist Admission History and Physical  Patient name: Maurice Nguyen Medical record number: [id number] Date of birth: 1966-07-04 Age: 49 y.o. Gender: male  Primary Care Provider: Burtis Junes, MD  Chief Complaint: RLE Cellulitis   History of Present Illness:This is a 49 y.o. year old male with significant past medical history of morbid obesity, mixed CHF NYHA class IV, atrial fibrillation on xarelto, HTN, type 2 DM,  presenting with RLE cellulitis. Pt reports worsening redness and pain in RLE. Noticed blister formation w/in last 1-2 weeks. Attempted warm soaks at home. Area began to spontaneously self drain w/in last week. Worsening redness. Subjective chills at home. Feels like worsening swelling. Denies any CP, SOB. Has been compliant w/ cardiac medication regimen at home. Denies any prior hx/o diabetic foot wounds at home. Reports stable blood sugars.  Presents to ER afebrile, hemodynamically stable, HR 90s-100s. WBC 7.2. Cr 1.36, BNP 490 (baseline 300-500), Glu 249.  EKG afib w/ RVR (HR 100s-110s).  Assessment and Plan: Maurice Nguyen is a 49 y.o. year old male presenting with RLE cellulitis   Active Problems:   Cellulitis   1- Cellulitis -oral augmentin per diabetic foot ulcer and cellulitis order set -mild disease clinically  -blood cultures -wound care -LE u/s r/o DVT -anticipate early d/c  2- Atrial fibrillation  -cont xarelto  -noted afib w/ RVR on EKG-asymptomatic -noted to be on coreg and dilt prior to hosp d/c  -restart low dose dilt  -tele bed -follow  3- Chronic mixed CHF, NYHA class IV -2d ECHO 09/2012 w/ EF 45-50%-diastolic function could not be determined -stable CHF on CXR -BNP at relative baseline -noted chronic LE edema in setting of body habitus/morbid obesity -cont home regimen  -hold NSAIDs as this may exacerbate fluid retention  -strict Is and Os and daily weights  4-NIDDM -SSI  -A1C -hold orals   FEN/GI: heart healthy carb  modified diet  Prophylaxis: sub q heparin  Disposition: pending further evaluation Code Status: Full Code    Patient Active Problem List   Diagnosis Date Noted  . Cellulitis 11/07/2014  . Anticoagulated, on Xarelto, Chads2Vas score 2 10/02/2013  . Atrial fibrillation with RVR, now rate controlled 09/27/2013  . Hypertensive urgency 09/27/2013  . Acute combined systolic and diastolic CHF, NYHA class 4, difficult to assess Diastolic function on echo 09/27/2013  . D (diarrhea) 08/22/2012  . New onset atrial fibrillation 08/22/2012  . 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 Obesity, morbid, BMI 50 or higher 07/13/2012  . Hypertension 07/13/2012  . Diabetes mellitus 07/13/2012  . Back pain, chronic 07/13/2012  . DIABETES MELLITUS, TYPE II, UNCONTROLLED 04/18/2009  . HYPERTENSION 04/18/2009  . OTHER POSTSURGICAL STATUS OTHER 04/18/2009   Past Medical History: Past Medical History  Diagnosis Date  . Diabetes mellitus   . Morbid obesity   . Lumbar herniated disc   . 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 Obesity, morbid, BMI 50 or higher 07/13/2012  . Hypertension 07/13/2012  . Diabetes mellitus 07/13/2012  . Back pain, chronic 07/13/2012  . Complication of anesthesia     " THEY LOST ME AND BROUGHT ME BACK "  . CHF (congestive heart failure)   . Irregular heart beat   . Shortness of breath   . Anticoagulated, on Xarelto, Chads2Vas score 2 10/02/2013    Past Surgical History: Past Surgical History  Procedure Laterality Date  . Abscess drainage    . Dental surgery    . Colonoscopy  08/22/2012    Procedure: COLONOSCOPY;  Surgeon: Oswaldo Done  Jeannine Boga, MD;  Location: [facility];  Service: Endoscopy;  Laterality: N/A;    Social History: History   Social History  . Marital Status: Single    Spouse Name: N/A  . Number of Children: N/A  . Years of Education: N/A   Social History Main Topics  . Smoking status: Never Smoker   . Smokeless tobacco: Never Used  . Alcohol Use: No      Comment: rarely  . Drug Use: No  . Sexual Activity: Yes   Other Topics Concern  . None   Social History Narrative    Family History: No family history on file.  Allergies: No Known Allergies  Current Facility-Administered Medications  Medication Dose Route Frequency Provider Last Rate Last Dose  . 0.9 %  sodium chloride infusion   Intravenous Continuous Floydene Flock, MD      . amoxicillin-clavulanate (AUGMENTIN) 875-125 MG per tablet 1 tablet  1 tablet Oral Q12H Floydene Flock, MD      . heparin injection 5,000 Units  5,000 Units Subcutaneous 3 times per day Floydene Flock, MD      . insulin aspart (novoLOG) injection 0-9 Units  0-9 Units Subcutaneous 6 times per day Floydene Flock, MD       Current Outpatient Prescriptions  Medication Sig Dispense Refill  . furosemide (LASIX) 20 MG tablet take 3 tablets by mouth once daily 90 tablet 3  . glipiZIDE (GLUCOTROL) 10 MG tablet Take 10 mg by mouth 2 (two) times daily before a meal.     . metFORMIN (GLUCOPHAGE) 1000 MG tablet Take 1,000 mg by mouth 2 (two) times daily with a meal.    . naproxen sodium (ANAPROX) 220 MG tablet Take 220 mg by mouth daily as needed (pain).    . nitroGLYCERIN (NITROSTAT) 0.4 MG SL tablet Place 1 tablet (0.4 mg total) under the tongue every 5 (five) minutes x 3 doses as needed for chest pain. 30 tablet 12  . potassium chloride SA (K-DUR,KLOR-CON) 20 MEQ tablet take 1 tablet by mouth twice a day 60 tablet 3  . ramipril (ALTACE) 2.5 MG capsule take 1 capsule by mouth twice a day 30 capsule 3  . XARELTO 20 MG TABS tablet take 1 tablet by mouth once daily with SUPPER 30 tablet 6  . carvedilol (COREG) 12.5 MG tablet Take 3 tablets (37.5 mg total) by mouth 2 (two) times daily with a meal. (Patient not taking: Reported on 07/11/2014) 180 tablet 6  . diltiazem (CARDIZEM CD) 240 MG 24 hr capsule Take 1 capsule (240 mg total) by mouth daily. (Patient not taking: Reported on 07/11/2014) 30 capsule 0   Review Of  Systems: 12 point ROS negative except as noted above in HPI.  Physical Exam: Filed Vitals:   11/07/14 1944  BP: 114/76  Pulse: 102  Temp:   Resp: 16    General: alert, cooperative and morbidly obese HEENT: PERRLA and extra ocular movement intact Heart: S1, S2 normal, no murmur, rub or gallop, regular rate and rhythm Lungs: clear to auscultation, no wheezes or rales and unlabored breathing Abdomen: obese abdomen, non tender, + bowel sound Extremities: venous stasis changes bilaterally, no open mild ulcerations on LRE anterior shin, +serous drainage Skin:as above  Neurology: normal without focal findings  Labs and Imaging: Lab Results  Component Value Date/Time   NA 136 11/07/2014 05:34 PM   K 4.7 11/07/2014 05:34 PM   CL 100 11/07/2014 05:34 PM   CO2 27 11/07/2014 05:34  PM   BUN 17 11/07/2014 05:34 PM   CREATININE 1.36* 11/07/2014 05:34 PM   GLUCOSE 249* 11/07/2014 05:34 PM   Lab Results  Component Value Date   WBC 7.2 11/07/2014   HGB 13.8 11/07/2014   HCT 42.5 11/07/2014   MCV 96.4 11/07/2014   PLT 228 11/07/2014    Dg Chest 2 View  11/07/2014   CLINICAL DATA:  Shortness of breath and leg pain. History of CHF. Increased bilateral lower extremity edema with right lower leg wound. Diabetes.  EXAM: CHEST  2 VIEW  COMPARISON:  07/08/2014  FINDINGS: Lordotic technique is demonstrated on the frontal exam. Lungs are adequately inflated without focal consolidation or effusion. Mild stable cardiomegaly. Remainder the exam is unchanged.  IMPRESSION: No active cardiopulmonary disease.  Mild stable cardiomegaly.   Electronically Signed   By: Elberta Fortis M.D.   On: 11/07/2014 19:50           Doree Albee MD  Pager: [phone]

## 2014-11-07 NOTE — ED Notes (Signed)
RN unable to obtain IV. Will have second RN attempt

## 2014-11-07 NOTE — ED Notes (Signed)
CBG Taken = 218

## 2014-11-07 NOTE — ED Notes (Signed)
Transporting PT to new room

## 2014-11-07 NOTE — ED Notes (Signed)
Pt here for sob and leg pain.  Hx of chf.  Pt states increased bil LE edema and wound to R lower leg.

## 2014-11-08 DIAGNOSIS — I482 Chronic atrial fibrillation: Secondary | ICD-10-CM | POA: Diagnosis present

## 2014-11-08 DIAGNOSIS — I878 Other specified disorders of veins: Secondary | ICD-10-CM | POA: Diagnosis present

## 2014-11-08 DIAGNOSIS — I10 Essential (primary) hypertension: Secondary | ICD-10-CM | POA: Diagnosis present

## 2014-11-08 DIAGNOSIS — R0602 Shortness of breath: Secondary | ICD-10-CM | POA: Diagnosis present

## 2014-11-08 DIAGNOSIS — E11621 Type 2 diabetes mellitus with foot ulcer: Secondary | ICD-10-CM | POA: Diagnosis present

## 2014-11-08 DIAGNOSIS — L0390 Cellulitis, unspecified: Secondary | ICD-10-CM | POA: Diagnosis not present

## 2014-11-08 DIAGNOSIS — Z7901 Long term (current) use of anticoagulants: Secondary | ICD-10-CM | POA: Diagnosis not present

## 2014-11-08 DIAGNOSIS — Z6841 Body Mass Index (BMI) 40.0 and over, adult: Secondary | ICD-10-CM | POA: Diagnosis not present

## 2014-11-08 DIAGNOSIS — I739 Peripheral vascular disease, unspecified: Secondary | ICD-10-CM | POA: Diagnosis not present

## 2014-11-08 DIAGNOSIS — L03115 Cellulitis of right lower limb: Secondary | ICD-10-CM

## 2014-11-08 DIAGNOSIS — I5040 Unspecified combined systolic (congestive) and diastolic (congestive) heart failure: Secondary | ICD-10-CM | POA: Diagnosis present

## 2014-11-08 LAB — GLUCOSE, CAPILLARY
Glucose-Capillary: 149 mg/dL — ABNORMAL HIGH (ref 70–99)
Glucose-Capillary: 151 mg/dL — ABNORMAL HIGH (ref 70–99)
Glucose-Capillary: 171 mg/dL — ABNORMAL HIGH (ref 70–99)
Glucose-Capillary: 177 mg/dL — ABNORMAL HIGH (ref 70–99)
Glucose-Capillary: 245 mg/dL — ABNORMAL HIGH (ref 70–99)
Glucose-Capillary: 247 mg/dL — ABNORMAL HIGH (ref 70–99)

## 2014-11-08 LAB — HIV ANTIBODY (ROUTINE TESTING W REFLEX): HIV Screen 4th Generation wRfx: NONREACTIVE

## 2014-11-08 LAB — PREALBUMIN: Prealbumin: 21 mg/dL (ref 21–43)

## 2014-11-08 LAB — C-REACTIVE PROTEIN: CRP: 1.2 mg/dL — ABNORMAL HIGH (ref <0.60)

## 2014-11-08 MED ORDER — PRO-STAT SUGAR FREE PO LIQD
30.0000 mL | Freq: Three times a day (TID) | ORAL | Status: DC
  Administered 2014-11-08 – 2014-11-11 (×9): 30 mL via ORAL
  Filled 2014-11-08 (×12): qty 30

## 2014-11-08 MED ORDER — GLUCERNA SHAKE PO LIQD
237.0000 mL | Freq: Every day | ORAL | Status: DC
  Administered 2014-11-08 – 2014-11-11 (×3): 237 mL via ORAL

## 2014-11-08 MED ORDER — CLINDAMYCIN PHOSPHATE 600 MG/50ML IV SOLN
600.0000 mg | Freq: Three times a day (TID) | INTRAVENOUS | Status: DC
  Administered 2014-11-08 – 2014-11-09 (×3): 600 mg via INTRAVENOUS
  Filled 2014-11-08 (×5): qty 50

## 2014-11-08 NOTE — Consult Note (Signed)
WOC wound consult note  Reason for Consult: LE ulcer.  Pt reports 2 -3 months history of LE edema.  Hx: DM2, HTN, CHF.  He does not work but is still ambulatory.  He has 2-3+ pitting edema bilaterally, some erythema of the RLE. Pulses bilateral weak, but palpable  Wound type: venous stasis ulcerations RLE, chart indicates started as blisters. Pt does report trauma to the pretibial area on the couch at home, however he has two ulcerations on the posterior calf  Measurement: RLE lateral: 5cm x 7cm x 0.2cm; posterior calf 2cm x 2cm x 0.1cm and 0.5cm x0.5cm x 0.1cm  Wound bed: pink, moist, thick drainage Drainage (amount, consistency, odor) thick, yellow Periwound:  erythema  Dressing procedure/placement/frequency: Covered ulcers with silicone foam for now. Requested ABI to determine safety for compression therapy, based on patients history feel he needs some low level compression 15-20 mmHG once we can reduce his current acute edema and treat his wounds.  Would recommend Unna's boots if ABI WNL bilaterally. Then follow up for weekly compression wraps with HHRN until wounds healed, and long term low level compression stockings can be obtained.   Will follow along and place orders if MD agrees with this plan  Manuelito Poage Marlena Clipper, CWOCN [phone]

## 2014-11-08 NOTE — Progress Notes (Signed)
INITIAL NUTRITION ASSESSMENT  DOCUMENTATION CODES Per approved criteria  -Morbid Obesity   INTERVENTION: Provide Glucerna Shake po once daily, each supplement provides 220 kcal and 10 grams of protein.  Provide 30 ml Prostat po TID, each supplement provides 100 kcal and 15 grams of protein.  NUTRITION DIAGNOSIS: Increased nutrient needs related to wound healing as evidenced by estimated nutrition needs.   Goal: Pt to meet >/= 90% of their estimated nutrition needs   Monitor:  PO intake, weight trends, labs, I/O's  Reason for Assessment: MD consult for wound healing  49 y.o. male  Admitting Dx: <principal problem not specified>  ASSESSMENT: Pt with significant past medical history of morbid obesity, mixed CHF NYHA class IV, atrial fibrillation on xarelto, HTN, type 2 DM, presenting with RLE cellulitis.  Pt reports having a good appetite with no other difficulties. Weight has been stable. Pt was educated on adequate/increased protein needs due to wound healing. Pt is agreeable on Glucerna Shake and Prostat. RD to order.   Pt with no observed significant fat or muscle mass loss.  Labs and medications reviewed.  Height: Ht Readings from Last 1 Encounters:  11/07/14 6\' 1"  (1.854 m)    Weight: Wt Readings from Last 1 Encounters:  11/07/14 454 lb (205.933 kg)    Ideal Body Weight: 184 lbs  % Ideal Body Weight: 247%  Wt Readings from Last 10 Encounters:  11/07/14 454 lb (205.933 kg)  07/11/14 442 lb 12.8 oz (200.853 kg)  07/08/14 461 lb 5 oz (209.25 kg)  10/29/13 447 lb 6.4 oz (202.939 kg)  10/07/13 444 lb 11.2 oz (201.715 kg)  09/04/12 409 lb (185.521 kg)  08/25/12 427 lb 3.2 oz (193.777 kg)  08/22/12 419 lb 1.5 oz (190.1 kg)  08/22/12 419 lb (190.057 kg)  08/03/12 419 lb 12.8 oz (190.42 kg)    Usual Body Weight: 454 lbs  % Usual Body Weight: 100%   BMI:  Body mass index is 59.91 kg/(m^2). Morbid obesity  Estimated Nutritional Needs: Kcal:  2400-2600 Protein: 140-160 grams Fluid: 2.4 - 2.6 L/day  Skin: venous stasis ulcerations RLE, non-pitting LE edema  Diet Order: Diet heart healthy/carb modified Room service appropriate?: Yes; Fluid consistency:: Thin  EDUCATION NEEDS: -Education needs addressed   Intake/Output Summary (Last 24 hours) at 11/08/14 1011 Last data filed at 11/07/14 2045  Gross per 24 hour  Intake      0 ml  Output   1885 ml  Net  -1885 ml    Last BM: 4/14  Labs:   Recent Labs Lab 11/07/14 1734 11/07/14 2133  NA 136 136  K 4.7 4.3  CL 100 97  CO2 27 27  BUN 17 16  CREATININE 1.36* 1.38*  CALCIUM 8.8 8.7  GLUCOSE 249* 230*    CBG (last 3)   Recent Labs  11/08/14 0006 11/08/14 0408 11/08/14 0950  GLUCAP 149* 151* 247*    Scheduled Meds: . amoxicillin-clavulanate  1 tablet Oral Q12H  . diltiazem  120 mg Oral Daily  . insulin aspart  0-9 Units Subcutaneous 6 times per day  . ramipril  2.5 mg Oral BID  . rivaroxaban  20 mg Oral Q supper    Continuous Infusions: . sodium chloride 10 mL/hr at 11/07/14 2049    Past Medical History  Diagnosis Date  . Diabetes mellitus   . Morbid obesity   . Lumbar herniated disc   . 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 Obesity, morbid, BMI 50 or higher 07/13/2012  . Hypertension  07/13/2012  . Diabetes mellitus 07/13/2012  . Back pain, chronic 07/13/2012  . Complication of anesthesia     " THEY LOST ME AND BROUGHT ME BACK "  . CHF (congestive heart failure)   . Irregular heart beat   . Shortness of breath   . Anticoagulated, on Xarelto, Chads2Vas score 2 10/02/2013    Past Surgical History  Procedure Laterality Date  . Abscess drainage    . Dental surgery    . Colonoscopy  08/22/2012    Procedure: COLONOSCOPY;  Surgeon: Shirley Friar, MD;  Location: [facility];  Service: Endoscopy;  Laterality: N/A;    Marijean Niemann, MS, RD, LDN Pager # [phone] After hours/ weekend pager # [phone]

## 2014-11-08 NOTE — Progress Notes (Signed)
[facility] PROGRESS NOTE  Maurice Nguyen IWO:[id number] DOB: [birth year] DOA: 11/07/2014 PCP: Burtis Junes, MD  Assessment/Plan: Active Problems:   Cellulitis - Patient had purulent discharge when evaluating wound. Will place back on IV antibiotic and reassess next a.m. - If no breakthrough fevers and improvement in discomfort we'll plan on deescalating antibiotics next a.m. - WBC within normal limits on last evaluation - obtaining AKI as per recommendations from wound care nurse  Code Status: full Family Communication: discussed with patient and spouse Disposition Plan: Pending improvement in condition   Consultants:  WOC  Procedures:  none  Antibiotics:  Clindamycin   HPI/Subjective: Pt has no new complaints other than his leg throbbing at times.  Objective: Filed Vitals:   11/08/14 0949  BP: 153/73  Pulse:   Temp:   Resp:     Intake/Output Summary (Last 24 hours) at 11/08/14 1609 Last data filed at 11/08/14 1100  Gross per 24 hour  Intake    490 ml  Output   2185 ml  Net  -1695 ml   Filed Weights   11/07/14 1739  Weight: 205.933 kg (454 lb)    Exam:   General:  Pt in nad, alert and awake  Cardiovascular: rrr, no mrg  Respiratory: cta bl, no wheezes  Abdomen: soft, ND, NT  Musculoskeletal: no cyanosis or clubbing  SKIN: RLE anterior leg wound with discharge   Data Reviewed: Basic Metabolic Panel:  Recent Labs Lab 11/07/14 1734 11/07/14 2133  NA 136 136  K 4.7 4.3  CL 100 97  CO2 27 27  GLUCOSE 249* 230*  BUN 17 16  CREATININE 1.36* 1.38*  CALCIUM 8.8 8.7   Liver Function Tests:  Recent Labs Lab 11/07/14 1734 11/07/14 2133  AST 25 26  ALT 21 23  ALKPHOS 53 57  BILITOT 1.1 1.4*  PROT 7.1 7.6  ALBUMIN 3.5 3.8   No results for input(s): LIPASE, AMYLASE in the last 168 hours. No results for input(s): AMMONIA in the last 168 hours. CBC:  Recent Labs Lab 11/07/14 1734  WBC 7.2  HGB 13.8  HCT 42.5   MCV 96.4  PLT 228   Cardiac Enzymes: No results for input(s): CKTOTAL, CKMB, CKMBINDEX, TROPONINI in the last 168 hours. BNP (last 3 results)  Recent Labs  11/07/14 1734  BNP 489.0*    ProBNP (last 3 results)  Recent Labs  07/08/14 1714  PROBNP 1232.0*    CBG:  Recent Labs Lab 11/07/14 2057 11/08/14 0006 11/08/14 0408 11/08/14 0950 11/08/14 1140  GLUCAP 218* 149* 151* 247* 245*    No results found for this or any previous visit (from the past 240 hour(s)).   Studies: Dg Chest 2 View  11/07/2014   CLINICAL DATA:  Shortness of breath and leg pain. History of CHF. Increased bilateral lower extremity edema with right lower leg wound. Diabetes.  EXAM: CHEST  2 VIEW  COMPARISON:  07/08/2014  FINDINGS: Lordotic technique is demonstrated on the frontal exam. Lungs are adequately inflated without focal consolidation or effusion. Mild stable cardiomegaly. Remainder the exam is unchanged.  IMPRESSION: No active cardiopulmonary disease.  Mild stable cardiomegaly.   Electronically Signed   By: Elberta Fortis M.D.   On: 11/07/2014 19:50    Scheduled Meds: . clindamycin (CLEOCIN) IV  600 mg Intravenous 3 times per day  . diltiazem  120 mg Oral Daily  . feeding supplement (GLUCERNA SHAKE)  237 mL Oral Q1500  . feeding supplement (PRO-STAT SUGAR FREE 64)  30  mL Oral TID BM  . insulin aspart  0-9 Units Subcutaneous 6 times per day  . ramipril  2.5 mg Oral BID  . rivaroxaban  20 mg Oral Q supper   Continuous Infusions: . sodium chloride 10 mL/hr at 11/07/14 2049    Time spent: > 35 minutes    Penny Pia  [facility] Pager [phone] If 7PM-7AM, please contact night-coverage at [email or web address], password [facility] 11/08/2014, 4:09 PM

## 2014-11-08 NOTE — Discharge Instructions (Signed)

## 2014-11-08 NOTE — Progress Notes (Signed)
Inpatient Diabetes Program Recommendations  AACE/ADA: New Consensus Statement on Inpatient Glycemic Control (2013)  Target Ranges:  Prepandial:   less than 140 mg/dL      Peak postprandial:   less than 180 mg/dL (1-2 hours)      Critically ill patients:  140 - 180 mg/dL   Fasting glucose this am controlled. Glucose following meal this am was elevated. Added Carbohydrate modified to existing diet orders. Consult for foot ulcers received. Noted RD has met with patient as well as the [facility] .  HgbA1C is 7.1% Inpatient Diabetes Program Recommendations Correction (SSI): Please consider increase in correction to modified tidwc and the HS correction scale  Thank you Rosita Kea, RN, MSN, CDE  Diabetes Inpatient Program Office: [phone] Pager: [phone] 8:00 am to 5:00 pm

## 2014-11-08 NOTE — Progress Notes (Signed)
UR completed 

## 2014-11-09 DIAGNOSIS — L03115 Cellulitis of right lower limb: Secondary | ICD-10-CM

## 2014-11-09 DIAGNOSIS — I739 Peripheral vascular disease, unspecified: Secondary | ICD-10-CM

## 2014-11-09 LAB — CBC WITH DIFFERENTIAL/PLATELET
Basophils Absolute: 0 10*3/uL (ref 0.0–0.1)
Basophils Relative: 0 % (ref 0–1)
Eosinophils Absolute: 0.1 10*3/uL (ref 0.0–0.7)
Eosinophils Relative: 1 % (ref 0–5)
HCT: 42.2 % (ref 39.0–52.0)
Hemoglobin: 14.3 g/dL (ref 13.0–17.0)
Lymphocytes Relative: 23 % (ref 12–46)
Lymphs Abs: 2 10*3/uL (ref 0.7–4.0)
MCH: 32.2 pg (ref 26.0–34.0)
MCHC: 33.9 g/dL (ref 30.0–36.0)
MCV: 95 fL (ref 78.0–100.0)
Monocytes Absolute: 1.1 10*3/uL — ABNORMAL HIGH (ref 0.1–1.0)
Monocytes Relative: 13 % — ABNORMAL HIGH (ref 3–12)
Neutro Abs: 5.5 10*3/uL (ref 1.7–7.7)
Neutrophils Relative %: 63 % (ref 43–77)
Platelets: 218 10*3/uL (ref 150–400)
RBC: 4.44 MIL/uL (ref 4.22–5.81)
RDW: 14.4 % (ref 11.5–15.5)
WBC: 8.7 10*3/uL (ref 4.0–10.5)

## 2014-11-09 LAB — GLUCOSE, CAPILLARY
Glucose-Capillary: 146 mg/dL — ABNORMAL HIGH (ref 70–99)
Glucose-Capillary: 189 mg/dL — ABNORMAL HIGH (ref 70–99)
Glucose-Capillary: 200 mg/dL — ABNORMAL HIGH (ref 70–99)
Glucose-Capillary: 212 mg/dL — ABNORMAL HIGH (ref 70–99)
Glucose-Capillary: 213 mg/dL — ABNORMAL HIGH (ref 70–99)
Glucose-Capillary: 260 mg/dL — ABNORMAL HIGH (ref 70–99)

## 2014-11-09 LAB — HEMOGLOBIN A1C
Hgb A1c MFr Bld: 8.6 % — ABNORMAL HIGH (ref 4.8–5.6)
Mean Plasma Glucose: 200 mg/dL

## 2014-11-09 MED ORDER — OXYCODONE-ACETAMINOPHEN 5-325 MG PO TABS
1.0000 | ORAL_TABLET | Freq: Four times a day (QID) | ORAL | Status: DC | PRN
  Administered 2014-11-09 – 2014-11-11 (×5): 2 via ORAL
  Filled 2014-11-09 (×5): qty 2

## 2014-11-09 MED ORDER — SENNA 8.6 MG PO TABS
1.0000 | ORAL_TABLET | Freq: Every day | ORAL | Status: DC | PRN
  Administered 2014-11-10: 8.6 mg via ORAL
  Filled 2014-11-09: qty 1

## 2014-11-09 MED ORDER — OXYCODONE HCL 5 MG PO TABS
5.0000 mg | ORAL_TABLET | ORAL | Status: DC | PRN
  Administered 2014-11-09 – 2014-11-10 (×2): 5 mg via ORAL
  Filled 2014-11-09 (×2): qty 1

## 2014-11-09 MED ORDER — FUROSEMIDE 20 MG PO TABS
60.0000 mg | ORAL_TABLET | Freq: Every day | ORAL | Status: DC
  Administered 2014-11-09 – 2014-11-11 (×3): 60 mg via ORAL
  Filled 2014-11-09 (×8): qty 1

## 2014-11-09 MED ORDER — CLINDAMYCIN HCL 300 MG PO CAPS
300.0000 mg | ORAL_CAPSULE | Freq: Four times a day (QID) | ORAL | Status: DC
  Administered 2014-11-09 – 2014-11-11 (×10): 300 mg via ORAL
  Filled 2014-11-09 (×10): qty 1

## 2014-11-09 NOTE — Progress Notes (Signed)
Changed dressings on RLE. And applied a foam dressing to the LLE which had some skin break down on it and looks of several blister like spots. Will continue to monitor.

## 2014-11-09 NOTE — Progress Notes (Signed)
[facility] PROGRESS NOTE  Maurice Nguyen DXA:[id number] DOB: [birth year] DOA: 11/07/2014 PCP: Burtis Junes, MD  Assessment/Plan: Active Problems:   Cellulitis - Patient had purulent discharge when evaluating wound.  - Will transition to oral Clindamycin - WBC within normal limits on last evaluation - obtaining AKI as per recommendations from wound care nurse - Adjusting pain medication regimen as patient reports minimal improvement with current pain medication regimen.  Code Status: full Family Communication: discussed with patient and spouse Disposition Plan: d/c most likely next am.   Consultants:  WOC  Procedures:  none  Antibiotics:  Clindamycin   HPI/Subjective: States that regimen only minimally improving his discomfort.  Objective: Filed Vitals:   11/09/14 0456  BP: 139/97  Pulse: 82  Temp: 98.4 F (36.9 C)  Resp: 18    Intake/Output Summary (Last 24 hours) at 11/09/14 1214 Last data filed at 11/09/14 0841  Gross per 24 hour  Intake 1225.17 ml  Output    700 ml  Net 525.17 ml   Filed Weights   11/07/14 1739 11/09/14 0456  Weight: 205.933 kg (454 lb) 204.345 kg (450 lb 8 oz)    Exam:   General:  Pt in nad, alert and awake  Cardiovascular: rrr, no mrg  Respiratory: cta bl, no wheezes  Abdomen: soft, ND, NT  Musculoskeletal: no cyanosis or clubbing  SKIN: RLE anterior leg wound currently covered with dressing  Data Reviewed: Basic Metabolic Panel:  Recent Labs Lab 11/07/14 1734 11/07/14 2133  NA 136 136  K 4.7 4.3  CL 100 97  CO2 27 27  GLUCOSE 249* 230*  BUN 17 16  CREATININE 1.36* 1.38*  CALCIUM 8.8 8.7   Liver Function Tests:  Recent Labs Lab 11/07/14 1734 11/07/14 2133  AST 25 26  ALT 21 23  ALKPHOS 53 57  BILITOT 1.1 1.4*  PROT 7.1 7.6  ALBUMIN 3.5 3.8   No results for input(s): LIPASE, AMYLASE in the last 168 hours. No results for input(s): AMMONIA in the last 168 hours. CBC:  Recent  Labs Lab 11/07/14 1734 11/09/14 0610  WBC 7.2 8.7  NEUTROABS  --  5.5  HGB 13.8 14.3  HCT 42.5 42.2  MCV 96.4 95.0  PLT 228 218   Cardiac Enzymes: No results for input(s): CKTOTAL, CKMB, CKMBINDEX, TROPONINI in the last 168 hours. BNP (last 3 results)  Recent Labs  11/07/14 1734  BNP 489.0*    ProBNP (last 3 results)  Recent Labs  07/08/14 1714  PROBNP 1232.0*    CBG:  Recent Labs Lab 11/08/14 1625 11/08/14 2036 11/09/14 0036 11/09/14 0401 11/09/14 0852  GLUCAP 171* 177* 146* 189* 213*    Recent Results (from the past 240 hour(s))  Blood Cultures x 2 sites     Status: None (Preliminary result)   Collection Time: 11/07/14  9:20 PM  Result Value Ref Range Status   Specimen Description BLOOD RIGHT ARM  Final   Special Requests BOTTLES DRAWN AEROBIC AND ANAEROBIC 5CC  Final   Culture   Final           BLOOD CULTURE RECEIVED NO GROWTH TO DATE CULTURE WILL BE HELD FOR 5 DAYS BEFORE ISSUING A FINAL NEGATIVE REPORT Performed at [facility]    Report Status PENDING  Incomplete  Blood Cultures x 2 sites     Status: None (Preliminary result)   Collection Time: 11/07/14  9:27 PM  Result Value Ref Range Status   Specimen Description BLOOD RIGHT HAND  Final  Special Requests BOTTLES DRAWN AEROBIC AND ANAEROBIC 5CC  Final   Culture   Final           BLOOD CULTURE RECEIVED NO GROWTH TO DATE CULTURE WILL BE HELD FOR 5 DAYS BEFORE ISSUING A FINAL NEGATIVE REPORT Performed at [facility]    Report Status PENDING  Incomplete     Studies: Dg Chest 2 View  11/07/2014   CLINICAL DATA:  Shortness of breath and leg pain. History of CHF. Increased bilateral lower extremity edema with right lower leg wound. Diabetes.  EXAM: CHEST  2 VIEW  COMPARISON:  07/08/2014  FINDINGS: Lordotic technique is demonstrated on the frontal exam. Lungs are adequately inflated without focal consolidation or effusion. Mild stable cardiomegaly. Remainder the exam is unchanged.   IMPRESSION: No active cardiopulmonary disease.  Mild stable cardiomegaly.   Electronically Signed   By: Elberta Fortis M.D.   On: 11/07/2014 19:50    Scheduled Meds: . clindamycin (CLEOCIN) IV  600 mg Intravenous 3 times per day  . diltiazem  120 mg Oral Daily  . feeding supplement (GLUCERNA SHAKE)  237 mL Oral Q1500  . feeding supplement (PRO-STAT SUGAR FREE 64)  30 mL Oral TID BM  . furosemide  60 mg Oral Daily  . insulin aspart  0-9 Units Subcutaneous 6 times per day  . ramipril  2.5 mg Oral BID  . rivaroxaban  20 mg Oral Q supper   Continuous Infusions: . sodium chloride 10 mL/hr at 11/07/14 2049    Time spent: > 35 minutes    Penny Pia  [facility] Pager [phone] If 7PM-7AM, please contact night-coverage at [email or web address], password [facility] 11/09/2014, 12:14 PM  LOS: 1 day

## 2014-11-09 NOTE — Progress Notes (Signed)
VASCULAR LAB PRELIMINARY  PRELIMINARY  PRELIMINARY  PRELIMINARY  Bilateral lower extremity venous duplex completed.    Preliminary report:  Bilateral:  No obvious evidence of DVT, superficial thrombosis, or Baker's Cyst. Technically difficult due to body habitus   Frederick Marro, RVS 11/09/2014, 11:43 AM

## 2014-11-09 NOTE — Progress Notes (Signed)
VASCULAR LAB PRELIMINARY  ARTERIAL  ABI completed:    RIGHT    LEFT    PRESSURE WAVEFORM  PRESSURE WAVEFORM  BRACHIAL 166 Triphasic BRACHIAL 145 Triphasic  DP 156 Triphasic DP 146 Triphasic  PT 173 Triphasic PT 108 Biphasic    RIGHT LEFT  ABI 1.04 0.88   ABIs indicate normal arterial flow on the right and a mild reduction on the left. Doppler waveforms are within normal limits bilaterally at rest.  [name], RVS 11/09/2014, 12:42 PM

## 2014-11-10 DIAGNOSIS — L0390 Cellulitis, unspecified: Secondary | ICD-10-CM

## 2014-11-10 LAB — GLUCOSE, CAPILLARY
Glucose-Capillary: 145 mg/dL — ABNORMAL HIGH (ref 70–99)
Glucose-Capillary: 186 mg/dL — ABNORMAL HIGH (ref 70–99)
Glucose-Capillary: 206 mg/dL — ABNORMAL HIGH (ref 70–99)
Glucose-Capillary: 206 mg/dL — ABNORMAL HIGH (ref 70–99)
Glucose-Capillary: 247 mg/dL — ABNORMAL HIGH (ref 70–99)
Glucose-Capillary: 258 mg/dL — ABNORMAL HIGH (ref 70–99)

## 2014-11-10 LAB — CBC WITH DIFFERENTIAL/PLATELET
Basophils Absolute: 0 10*3/uL (ref 0.0–0.1)
Basophils Relative: 0 % (ref 0–1)
Eosinophils Absolute: 0.2 10*3/uL (ref 0.0–0.7)
Eosinophils Relative: 3 % (ref 0–5)
HCT: 43.4 % (ref 39.0–52.0)
Hemoglobin: 14 g/dL (ref 13.0–17.0)
Lymphocytes Relative: 31 % (ref 12–46)
Lymphs Abs: 2.2 10*3/uL (ref 0.7–4.0)
MCH: 31.1 pg (ref 26.0–34.0)
MCHC: 32.3 g/dL (ref 30.0–36.0)
MCV: 96.4 fL (ref 78.0–100.0)
Monocytes Absolute: 0.8 10*3/uL (ref 0.1–1.0)
Monocytes Relative: 11 % (ref 3–12)
Neutro Abs: 4 10*3/uL (ref 1.7–7.7)
Neutrophils Relative %: 55 % (ref 43–77)
Platelets: 205 10*3/uL (ref 150–400)
RBC: 4.5 MIL/uL (ref 4.22–5.81)
RDW: 14.3 % (ref 11.5–15.5)
WBC: 7.2 10*3/uL (ref 4.0–10.5)

## 2014-11-10 LAB — COMPREHENSIVE METABOLIC PANEL
ALT: 18 U/L (ref 0–53)
AST: 15 U/L (ref 0–37)
Albumin: 3.2 g/dL — ABNORMAL LOW (ref 3.5–5.2)
Alkaline Phosphatase: 55 U/L (ref 39–117)
Anion gap: 12 (ref 5–15)
BUN: 14 mg/dL (ref 6–23)
CO2: 25 mmol/L (ref 19–32)
Calcium: 8.5 mg/dL (ref 8.4–10.5)
Chloride: 99 mmol/L (ref 96–112)
Creatinine, Ser: 1.11 mg/dL (ref 0.50–1.35)
GFR calc Af Amer: 88 mL/min — ABNORMAL LOW (ref >90)
GFR calc non Af Amer: 76 mL/min — ABNORMAL LOW (ref >90)
Glucose, Bld: 214 mg/dL — ABNORMAL HIGH (ref 70–99)
Potassium: 4.4 mmol/L (ref 3.5–5.1)
Sodium: 136 mmol/L (ref 135–145)
Total Bilirubin: 1.3 mg/dL — ABNORMAL HIGH (ref 0.3–1.2)
Total Protein: 7.2 g/dL (ref 6.0–8.3)

## 2014-11-10 NOTE — Progress Notes (Addendum)
[facility] PROGRESS NOTE  LIGE LAKEMAN ZOX:[id number] DOB: 1966-05-10 DOA: 11/07/2014 PCP: Burtis Junes, MD  Assessment/Plan: Active Problems:   Cellulitis - addendum: plan will be to continue wound care once patient transitions home. - Tolerating oral clindamycin. - WBC within normal limits on last evaluation - obtaining AKI as per recommendations from wound care nurse - Adjusting pain medication regimen as patient reports minimal improvement with current pain medication regimen.  Code Status: full Family Communication: discussed with patient and spouse Disposition Plan: d/c most likely next am.   Consultants:  WOC  Procedures:  none  Antibiotics:  Clindamycin   HPI/Subjective: Today patient complained of lightheadedness and not having a BM  Objective: Filed Vitals:   11/10/14 0508  BP: 131/78  Pulse: 91  Temp: 97.9 F (36.6 C)  Resp: 18    Intake/Output Summary (Last 24 hours) at 11/10/14 1631 Last data filed at 11/10/14 1300  Gross per 24 hour  Intake   1120 ml  Output    900 ml  Net    220 ml   Filed Weights   11/07/14 1739 11/09/14 0456 11/10/14 0508  Weight: 205.933 kg (454 lb) 204.345 kg (450 lb 8 oz) 206.976 kg (456 lb 4.8 oz)    Exam:   General:  Pt in nad, alert and awake  Cardiovascular: rrr, no mrg  Respiratory: cta bl, no wheezes  Abdomen: soft, ND, NT  Musculoskeletal: no cyanosis or clubbing  SKIN: RLE anterior leg wound currently covered with dressing  Data Reviewed: Basic Metabolic Panel:  Recent Labs Lab 11/07/14 1734 11/07/14 2133 11/10/14 0544  NA 136 136 136  K 4.7 4.3 4.4  CL 100 97 99  CO2 GLUCOSE 249* 230* 214*  BUN CREATININE 1.36* 1.38* 1.11  CALCIUM 8.8 8.7 8.5   Liver Function Tests:  Recent Labs Lab 11/07/14 1734 11/07/14 2133 11/10/14 0544  AST ALT ALKPHOS 53 57 55  BILITOT 1.1 1.4* 1.3*  PROT 7.1 7.6 7.2  ALBUMIN 3.5 3.8 3.2*    No results for input(s): LIPASE, AMYLASE in the last 168 hours. No results for input(s): AMMONIA in the last 168 hours. CBC:  Recent Labs Lab 11/07/14 1734 11/09/14 0610 11/10/14 0544  WBC 7.2 8.7 7.2  NEUTROABS  --  5.5 4.0  HGB 13.8 14.3 14.0  HCT 42.5 42.2 43.4  MCV 96.4 95.0 96.4  PLT 228 218 205   Cardiac Enzymes: No results for input(s): CKTOTAL, CKMB, CKMBINDEX, TROPONINI in the last 168 hours. BNP (last 3 results)  Recent Labs  11/07/14 1734  BNP 489.0*    ProBNP (last 3 results)  Recent Labs  07/08/14 1714  PROBNP 1232.0*    CBG:  Recent Labs Lab 11/09/14 1556 11/09/14 2012 11/10/14 0005 11/10/14 0354 11/10/14 0810  GLUCAP 260* 212* 145* 186* 258*    Recent Results (from the past 240 hour(s))  Blood Cultures x 2 sites     Status: None (Preliminary result)   Collection Time: 11/07/14  9:20 PM  Result Value Ref Range Status   Specimen Description BLOOD RIGHT ARM  Final   Special Requests BOTTLES DRAWN AEROBIC AND ANAEROBIC 5CC  Final   Culture   Final           BLOOD CULTURE RECEIVED NO GROWTH TO DATE CULTURE WILL BE HELD FOR 5 DAYS BEFORE ISSUING A FINAL NEGATIVE REPORT Performed at [facility]  Report Status PENDING  Incomplete  Blood Cultures x 2 sites     Status: None (Preliminary result)   Collection Time: 11/07/14  9:27 PM  Result Value Ref Range Status   Specimen Description BLOOD RIGHT HAND  Final   Special Requests BOTTLES DRAWN AEROBIC AND ANAEROBIC 5CC  Final   Culture   Final           BLOOD CULTURE RECEIVED NO GROWTH TO DATE CULTURE WILL BE HELD FOR 5 DAYS BEFORE ISSUING A FINAL NEGATIVE REPORT Performed at [facility]    Report Status PENDING  Incomplete     Studies: No results found.  Scheduled Meds: . clindamycin  300 mg Oral 4 times per day  . diltiazem  120 mg Oral Daily  . feeding supplement (GLUCERNA SHAKE)  237 mL Oral Q1500  . feeding supplement (PRO-STAT SUGAR FREE 64)  30 mL Oral TID BM   . furosemide  60 mg Oral Daily  . insulin aspart  0-9 Units Subcutaneous 6 times per day  . ramipril  2.5 mg Oral BID  . rivaroxaban  20 mg Oral Q supper   Continuous Infusions: . sodium chloride 10 mL/hr at 11/07/14 2049    Time spent: > 35 minutes    Penny Pia  [facility] Pager [phone] If 7PM-7AM, please contact night-coverage at [email or web address], password [facility] 11/10/2014, 4:31 PM  LOS: 2 days

## 2014-11-10 NOTE — Progress Notes (Signed)
Pt started complaining of dizziness around midnight when his blood sugar was 145 (from 212 at 2000) Vital signs were stable. Pt requested to remain sitting up in chair with feet elevated. Expressed concern that he might be discharged today while he is still dizzy.

## 2014-11-11 LAB — COMPREHENSIVE METABOLIC PANEL
ALT: 15 U/L (ref 0–53)
AST: 13 U/L (ref 0–37)
Albumin: 3 g/dL — ABNORMAL LOW (ref 3.5–5.2)
Alkaline Phosphatase: 52 U/L (ref 39–117)
Anion gap: 12 (ref 5–15)
BUN: 12 mg/dL (ref 6–23)
CO2: 24 mmol/L (ref 19–32)
Calcium: 8.3 mg/dL — ABNORMAL LOW (ref 8.4–10.5)
Chloride: 98 mmol/L (ref 96–112)
Creatinine, Ser: 1.01 mg/dL (ref 0.50–1.35)
GFR calc Af Amer: >90 mL/min (ref >90)
GFR calc non Af Amer: 85 mL/min — ABNORMAL LOW (ref >90)
Glucose, Bld: 185 mg/dL — ABNORMAL HIGH (ref 70–99)
Potassium: 4.3 mmol/L (ref 3.5–5.1)
Sodium: 134 mmol/L — ABNORMAL LOW (ref 135–145)
Total Bilirubin: 1.4 mg/dL — ABNORMAL HIGH (ref 0.3–1.2)
Total Protein: 6.6 g/dL (ref 6.0–8.3)

## 2014-11-11 LAB — CBC WITH DIFFERENTIAL/PLATELET
Basophils Absolute: 0 10*3/uL (ref 0.0–0.1)
Basophils Relative: 0 % (ref 0–1)
Eosinophils Absolute: 0.2 10*3/uL (ref 0.0–0.7)
Eosinophils Relative: 2 % (ref 0–5)
HCT: 39.6 % (ref 39.0–52.0)
Hemoglobin: 13.1 g/dL (ref 13.0–17.0)
Lymphocytes Relative: 22 % (ref 12–46)
Lymphs Abs: 1.8 10*3/uL (ref 0.7–4.0)
MCH: 31.5 pg (ref 26.0–34.0)
MCHC: 33.1 g/dL (ref 30.0–36.0)
MCV: 95.2 fL (ref 78.0–100.0)
Monocytes Absolute: 1 10*3/uL (ref 0.1–1.0)
Monocytes Relative: 12 % (ref 3–12)
Neutro Abs: 5.4 10*3/uL (ref 1.7–7.7)
Neutrophils Relative %: 65 % (ref 43–77)
Platelets: 233 10*3/uL (ref 150–400)
RBC: 4.16 MIL/uL — ABNORMAL LOW (ref 4.22–5.81)
RDW: 14.2 % (ref 11.5–15.5)
WBC: 8.3 10*3/uL (ref 4.0–10.5)

## 2014-11-11 LAB — GLUCOSE, CAPILLARY
Glucose-Capillary: 219 mg/dL — ABNORMAL HIGH (ref 70–99)
Glucose-Capillary: 153 mg/dL — ABNORMAL HIGH (ref 70–99)
Glucose-Capillary: 239 mg/dL — ABNORMAL HIGH (ref 70–99)
Glucose-Capillary: 158 mg/dL — ABNORMAL HIGH (ref 70–99)
Glucose-Capillary: 209 mg/dL — ABNORMAL HIGH (ref 70–99)

## 2014-11-11 MED ORDER — GI COCKTAIL ~~LOC~~
30.0000 mL | Freq: Once | ORAL | Status: AC
  Administered 2014-11-11: 30 mL via ORAL
  Filled 2014-11-11: qty 30

## 2014-11-11 MED ORDER — DILTIAZEM HCL ER COATED BEADS 120 MG PO CP24: 120.0000 mg | ORAL_CAPSULE | Freq: Every day | ORAL | Status: DC

## 2014-11-11 MED ORDER — OXYCODONE-ACETAMINOPHEN 5-325 MG PO TABS: 1.0000 | ORAL_TABLET | Freq: Four times a day (QID) | ORAL | Status: DC | PRN

## 2014-11-11 MED ORDER — CLINDAMYCIN HCL 300 MG PO CAPS: 300.0000 mg | ORAL_CAPSULE | Freq: Four times a day (QID) | ORAL | Status: DC

## 2014-11-11 NOTE — Progress Notes (Signed)
Patient was discharged today 11/11/2014  5:24 PM. Medications Rx (other than narcotics) were faxed to their requested pharmacy. They were given a hard copy of their Rx and discharge papers were signed. They denied further questions. Patient and family was eager for discharge and tolerated cath removal well. Candee Furbish, RN

## 2014-11-11 NOTE — Clinical Documentation Improvement (Signed)
  Documentation Clarification  "Chronic mixed CHF, NYHA class IV" documented in current H&P.  Please document if a condition below provides greater specificity regarding the TYPE of CHF:   - Combined Systolic and Diastolic  - Other Type  - Unable to Clinically Determine  Thank You, Jerral Ralph ,RN Clinical Documentation Specialist:  [phone] [facility]- Health Information Management

## 2014-11-11 NOTE — Discharge Summary (Signed)
Physician Discharge Summary  Maurice Nguyen JYN:[id number] DOB: February 20, 1966 DOA: 11/07/2014  PCP: Burtis Junes, MD  Admit date: 11/07/2014 Discharge date: 11/11/2014  Time spent: > 35 minutes  Recommendations for Outpatient Follow-up:  1. Pt will need dressing changes by home health nursing 2. Reassess serum creatinine in 1-2 weeks 3. Continue to monitor blood sugars and adjust hypoglycemic agents accordingly.  Discharge Diagnoses:  Active Problems:   Cellulitis   Discharge Condition: stable  Diet recommendation: diabetic diet.  Filed Weights   11/09/14 0456 11/10/14 0508 11/11/14 0604  Weight: 204.345 kg (450 lb 8 oz) 206.976 kg (456 lb 4.8 oz) 206.4 kg (455 lb 0.5 oz)    History of present illness:  From original HPI: This is a 49 y.o. year old male with significant past medical history of morbid obesity, mixed CHF NYHA class IV, atrial fibrillation on xarelto, HTN, type 2 DM, presenting with RLE cellulitis. Pt reports worsening redness and pain in RLE. Noticed blister formation w/in last 1-2 weeks.   Hospital Course:  Cellulitis - addendum: plan will be to continue wound care once patient transitions home. - Tolerating oral clindamycin. Will be discharged with 6 more days of antibiotics to complete a 10 day total treatment course - WBC within normal limits on last evaluation - WOC nurse consulted. And recommended una boots on d/c with home health nursing to come out to home and change dressings on 11/14/14   Procedures:  None  Consultations:  none  Discharge Exam: Filed Vitals:   11/11/14 0455  BP: 109/72  Pulse: 82  Temp: 99.1 F (37.3 C)  Resp: 19    General: Pt in nad, alert and awake Cardiovascular: non cyanotic Respiratory: no increase wob, no wheezes, equal chest rise.  Discharge Instructions   Discharge Instructions    Call MD for:  difficulty breathing, headache or visual disturbances    Complete by:  As directed      Call MD for:   persistant dizziness or light-headedness    Complete by:  As directed      Call MD for:  temperature >100.4    Complete by:  As directed      Diet - low sodium heart healthy    Complete by:  As directed      Discharge instructions    Complete by:  As directed   Discharge home with home health     Increase activity slowly    Complete by:  As directed           Current Discharge Medication List    START taking these medications   Details  clindamycin (CLEOCIN) 300 MG capsule Take 1 capsule (300 mg total) by mouth every 6 (six) hours. Qty: 24 capsule, Refills: 0    oxyCODONE-acetaminophen (PERCOCET/ROXICET) 5-325 MG per tablet Take 1-2 tablets by mouth every 6 (six) hours as needed for moderate pain. Qty: 30 tablet, Refills: 0      CONTINUE these medications which have CHANGED   Details  diltiazem (CARDIZEM CD) 120 MG 24 hr capsule Take 1 capsule (120 mg total) by mouth daily. Qty: 30 capsule, Refills: 0      CONTINUE these medications which have NOT CHANGED   Details  furosemide (LASIX) 20 MG tablet take 3 tablets by mouth once daily Qty: 90 tablet, Refills: 3    glipiZIDE (GLUCOTROL) 10 MG tablet Take 10 mg by mouth 2 (two) times daily before a meal.     metFORMIN (GLUCOPHAGE) 1000 MG tablet Take  1,000 mg by mouth 2 (two) times daily with a meal.    nitroGLYCERIN (NITROSTAT) 0.4 MG SL tablet Place 1 tablet (0.4 mg total) under the tongue every 5 (five) minutes x 3 doses as needed for chest pain. Qty: 30 tablet, Refills: 12    potassium chloride SA (K-DUR,KLOR-CON) 20 MEQ tablet take 1 tablet by mouth twice a day Qty: 60 tablet, Refills: 3    ramipril (ALTACE) 2.5 MG capsule take 1 capsule by mouth twice a day Qty: 30 capsule, Refills: 3    XARELTO 20 MG TABS tablet take 1 tablet by mouth once daily with SUPPER Qty: 30 tablet, Refills: 6      STOP taking these medications     naproxen sodium (ANAPROX) 220 MG tablet      carvedilol (COREG) 12.5 MG tablet         No Known Allergies    The results of significant diagnostics from this hospitalization (including imaging, microbiology, ancillary and laboratory) are listed below for reference.    Significant Diagnostic Studies: Dg Chest 2 View  11/07/2014   CLINICAL DATA:  Shortness of breath and leg pain. History of CHF. Increased bilateral lower extremity edema with right lower leg wound. Diabetes.  EXAM: CHEST  2 VIEW  COMPARISON:  07/08/2014  FINDINGS: Lordotic technique is demonstrated on the frontal exam. Lungs are adequately inflated without focal consolidation or effusion. Mild stable cardiomegaly. Remainder the exam is unchanged.  IMPRESSION: No active cardiopulmonary disease.  Mild stable cardiomegaly.   Electronically Signed   By: Elberta Fortis M.D.   On: 11/07/2014 19:50    Microbiology: Recent Results (from the past 240 hour(s))  Blood Cultures x 2 sites     Status: None (Preliminary result)   Collection Time: 11/07/14  9:20 PM  Result Value Ref Range Status   Specimen Description BLOOD RIGHT ARM  Final   Special Requests BOTTLES DRAWN AEROBIC AND ANAEROBIC 5CC  Final   Culture   Final           BLOOD CULTURE RECEIVED NO GROWTH TO DATE CULTURE WILL BE HELD FOR 5 DAYS BEFORE ISSUING A FINAL NEGATIVE REPORT Performed at [facility]    Report Status PENDING  Incomplete  Blood Cultures x 2 sites     Status: None (Preliminary result)   Collection Time: 11/07/14  9:27 PM  Result Value Ref Range Status   Specimen Description BLOOD RIGHT HAND  Final   Special Requests BOTTLES DRAWN AEROBIC AND ANAEROBIC 5CC  Final   Culture   Final           BLOOD CULTURE RECEIVED NO GROWTH TO DATE CULTURE WILL BE HELD FOR 5 DAYS BEFORE ISSUING A FINAL NEGATIVE REPORT Performed at [facility]    Report Status PENDING  Incomplete     Labs: Basic Metabolic Panel:  Recent Labs Lab 11/07/14 1734 11/07/14 2133 11/10/14 0544 11/11/14 0520  NA 136 136 136 134*  K 4.7 4.3 4.4 4.3   CL 100 97 99 98  CO2 27 27 25 24   GLUCOSE 249* 230* 214* 185*  BUN 17 16 14 12   CREATININE 1.36* 1.38* 1.11 1.01  CALCIUM 8.8 8.7 8.5 8.3*   Liver Function Tests:  Recent Labs Lab 11/07/14 1734 11/07/14 2133 11/10/14 0544 11/11/14 0520  AST 25 26 15 13   ALT 21 23 18 15   ALKPHOS 53 57 55 52  BILITOT 1.1 1.4* 1.3* 1.4*  PROT 7.1 7.6 7.2 6.6  ALBUMIN 3.5  3.8 3.2* 3.0*   No results for input(s): LIPASE, AMYLASE in the last 168 hours. No results for input(s): AMMONIA in the last 168 hours. CBC:  Recent Labs Lab 11/07/14 1734 11/09/14 0610 11/10/14 0544 11/11/14 0520  WBC 7.2 8.7 7.2 8.3  NEUTROABS  --  5.5 4.0 5.4  HGB 13.8 14.3 14.0 13.1  HCT 42.5 42.2 43.4 39.6  MCV 96.4 95.0 96.4 95.2  PLT 228 218 205 233   Cardiac Enzymes: No results for input(s): CKTOTAL, CKMB, CKMBINDEX, TROPONINI in the last 168 hours. BNP: BNP (last 3 results)  Recent Labs  11/07/14 1734  BNP 489.0*    ProBNP (last 3 results)  Recent Labs  07/08/14 1714  PROBNP 1232.0*    CBG:  Recent Labs Lab 11/10/14 1625 11/10/14 2123 11/11/14 0059 11/11/14 0501 11/11/14 0823  GLUCAP 206* 247* 219* 153* 239*       Signed:  Penny Pia  [facility] 11/11/2014, 11:20 AM

## 2014-11-11 NOTE — Progress Notes (Signed)
Orthopedic Tech Progress Note Patient Details:  Maurice Nguyen 1965/11/03 [id number]  Ortho Devices Type of Ortho Device: Roland Rack boot Ortho Device/Splint Location: bilateral Ortho Device/Splint Interventions: Application   Khaalid Lefkowitz 11/11/2014, 1:27 PM

## 2014-11-11 NOTE — Consult Note (Signed)
WOC wound follow up  ABI's reviewed. WNL Wound type: venous stasis ulcerations RLE Measurement: see last week note Wound bed: both areas are clean, 100% pink, moist  Drainage (amount, consistency, odor) moderate serous drainage from the lateral  Periwound:intact  Dressing procedure/placement/frequency: Will add calcium alginate to wound bed for absorbency with the foam, and to protect and insulate wound bed.  Will add Unna's boots bilaterally for compression therapy.  Once ulcerations have healed would suggest long term compression hose to be ordered. Will need HHRN for a few weeks for wound assessment and compression wrap change.  Would like to have first change on Thursday of this week.   Contacted [facility] to discuss POC, he agrees with plan and will order [facility].  Discussed POC with patient and bedside nurse.  Re consult if needed, will not follow at this time. Thanks  Aryam Zhan [employer], CWOCN [phone])

## 2014-11-11 NOTE — Progress Notes (Signed)
Inpatient Diabetes Program Recommendations  AACE/ADA: New Consensus Statement on Inpatient Glycemic Control (2013)  Target Ranges:  Prepandial:   less than 140 mg/dL      Peak postprandial:   less than 180 mg/dL (1-2 hours)      Critically ill patients:  140 - 180 mg/dL   Results for ALAZAR, PLONA (MRN [id number]) as of 11/11/2014 10:15  Ref. Range 11/10/2014 03:54 11/10/2014 08:10 11/10/2014 11:27 11/10/2014 16:25 11/10/2014 21:23 11/11/2014 00:59 11/11/2014 05:01 11/11/2014 08:23  Glucose-Capillary Latest Ref Range: 70-99 mg/dL 093 (H) 267 (H) 124 (H) 206 (H) 247 (H) 219 (H) 153 (H) 239 (H)    Diabetes history: DM2 Outpatient Diabetes medications: Glipizide 10 mg BID, Metformin 1000 mg BID Current orders for Inpatient glycemic control: Novolog 0-9 units Q4H  Inpatient Diabetes Program Recommendations Insulin - Basal: While inpatient, please consider ordering low dose basal insulin; recommend starting with Levemir 10 units daily. Correction (SSI): Please consider increasing Novolog correction to resistant scale and change frequency to ACHS if patient is eating at least 50% of meals.  Thanks, Orlando Penner, RN, MSN, CCRN, CDE Diabetes Coordinator Inpatient Diabetes Program [phone] (Team Pager from 8am to 5pm) [phone] ([facility] office) [phone] [facility] office)

## 2014-11-11 NOTE — Care Management Note (Signed)
CARE MANAGEMENT NOTE 11/11/2014  Patient:  Maurice Nguyen, Maurice Nguyen   Account Number:  [id number]  Date Initiated:  11/07/2014  Documentation initiated by:  Michel Bickers  Subjective/Objective Assessment:   Patient presenting with RLE cellulitis     Action/Plan:   HHRN,PT   Anticipated DC Date:  11/09/2014   Anticipated DC Plan:  HOME W HOME HEALTH SERVICES      DC Planning Services  CM consult      [facility] Choice  HOME HEALTH  DURABLE MEDICAL EQUIPMENT   Choice offered to / List presented to:  C-1 Patient           HH agency  [facility]   Status of service:  Completed, signed off Medicare Important Message given?  NA - LOS <3 / Initial given by admissions (If response is "NO", the following Medicare IM given date fields will be blank) Date Medicare IM given:   Medicare IM given by:   Date Additional Medicare IM given:  11/11/2014 Additional Medicare IM given by:  Vance Peper  Discharge Disposition:  HOME [facility] SERVICES  Per UR Regulation:  Reviewed for med. necessity/level of care/duration of stay  If discussed at Long Length of Stay Meetings, dates discussed:

## 2014-11-11 NOTE — Progress Notes (Signed)
Patient states "Something is stuck in my throat and got pain when I swallow". Denies any chest pain. Elevated HOB but doesn't seem it helps. Paged [facility] and K. Schorr called back. Ordered G.I cocktail 30 ml and given to patient. Instructed this RN to call when symptom persists. Pt sitting in the recliner. Will continue to monitor.

## 2014-11-11 NOTE — Clinical Social Work Note (Signed)
Per RN Tech, patient and patient's wife requesting to speak with CSW. CSW met with patient and patient's wife at bedside. Patient requesting to speak with CSW due to questions regarding home health and DMEs. CSW informed patient and patient's wife CSW will consult RNCM to speak with them at bedside. Patient and patient's wife agreeable. CSW consulted RNCM regarding information above. CSW signing off.  Lubertha Sayres, [place] Cell: [phone]       Fax: [phone] Clinical Social Work: Orthopedics [phone]) and Surgical [phone])

## 2014-11-11 NOTE — Progress Notes (Signed)
Patient complains of numbness on his bilateral lower extremity. Instructed patient to do some foot exercises, elevated both foot and put warm compresses. Will continue to monitor.

## 2014-11-14 ENCOUNTER — Telehealth: Payer: Self-pay | Admitting: *Deleted

## 2014-11-14 LAB — CULTURE, BLOOD (ROUTINE X 2)
Culture: NO GROWTH
Culture: NO GROWTH

## 2014-11-14 NOTE — Telephone Encounter (Signed)
Nurse assigned to the patient called to advise they can not open him because he is not home bound. She advised she will send him to [place] today so that we can change his bandages. Advised her he is not a patient here yet and that she will have to contact his PCP if he needs to be seen prior to his appt here 11/21/14. She advised she has tried to call their office and has not been able to get them. Advised her to leave a message for them to call the patient back but that we can not see him as there are no providers in the office and until he sees the doctor next week he is not a patient here. She asked for my name and I gave it to her. Also advised my supervisor of the call.

## 2014-11-15 ENCOUNTER — Encounter (HOSPITAL_COMMUNITY): Payer: Self-pay | Admitting: *Deleted

## 2014-11-15 ENCOUNTER — Emergency Department (HOSPITAL_COMMUNITY)
Admission: EM | Admit: 2014-11-15 | Discharge: 2014-11-15 | Disposition: A | Discharge status: Home / Self Care | Payer: Medicare Other | Attending: Emergency Medicine | Admitting: Emergency Medicine

## 2014-11-15 DIAGNOSIS — E119 Type 2 diabetes mellitus without complications: Secondary | ICD-10-CM | POA: Insufficient documentation

## 2014-11-15 DIAGNOSIS — Z4800 Encounter for change or removal of nonsurgical wound dressing: Secondary | ICD-10-CM | POA: Diagnosis present

## 2014-11-15 DIAGNOSIS — L97911 Non-pressure chronic ulcer of unspecified part of right lower leg limited to breakdown of skin: Secondary | ICD-10-CM | POA: Diagnosis not present

## 2014-11-15 DIAGNOSIS — Z7901 Long term (current) use of anticoagulants: Secondary | ICD-10-CM | POA: Insufficient documentation

## 2014-11-15 DIAGNOSIS — G8929 Other chronic pain: Secondary | ICD-10-CM | POA: Insufficient documentation

## 2014-11-15 DIAGNOSIS — I509 Heart failure, unspecified: Secondary | ICD-10-CM | POA: Diagnosis not present

## 2014-11-15 DIAGNOSIS — I10 Essential (primary) hypertension: Secondary | ICD-10-CM | POA: Insufficient documentation

## 2014-11-15 DIAGNOSIS — L97811 Non-pressure chronic ulcer of other part of right lower leg limited to breakdown of skin: Secondary | ICD-10-CM | POA: Diagnosis not present

## 2014-11-15 NOTE — ED Notes (Signed)
The pt has ulcers on his lower legs.  He is a diabetic  With chf but that is not what he is here for so the pt said

## 2014-11-15 NOTE — ED Notes (Signed)
Pt and wife given each a [place] sandwich and something to drink.

## 2014-11-15 NOTE — Progress Notes (Signed)
ED CM met with patient at bedside, patient reports that [facility] services has stated that patient is no longer homebound and will not be able to continue providing services. Patient has an appointment to follow up at the ID clinic for follow up with cellulitis. ED CM will place referral for patient to f/u at  [facility]. No further ED CM needs identified.

## 2014-11-15 NOTE — Discharge Instructions (Signed)
As discussed, it is important that you follow up with your physician for continued management of your condition. ° °If you develop any new, or concerning changes in your condition, please return to the emergency department immediately. °

## 2014-11-15 NOTE — ED Notes (Signed)
Pt and family came up asking to speak with case management. Genia Del and she stated she would come see them once they get into a room and that the nurse needs to put a consult in so she will be able to see them.

## 2014-11-15 NOTE — ED Notes (Signed)
Ortho tech at bedside 

## 2014-11-15 NOTE — Progress Notes (Signed)
Orthopedic Tech Progress Note Patient Details:  Maurice Nguyen January 29, 1966 [id number]      [name] 11/15/2014, 7:17 PM

## 2014-11-15 NOTE — ED Notes (Signed)
The pt  Was just discharged Monday.  He was supposed to get his lower legs wrapped for one week.  The nurse came to his house to wrap his legs but when they found that they had to pay for that service they told the nurse to leave and they came to the hospital to have them rewrapped.

## 2014-11-15 NOTE — Progress Notes (Signed)
note

## 2014-11-15 NOTE — Progress Notes (Signed)
Orthopedic Tech Progress Note Patient Details:  Maurice Nguyen September 24, 1965 [id number] Applied (B) unna boots to LE     [name] 11/15/2014, 7:16 PM

## 2014-11-15 NOTE — ED Provider Notes (Signed)
CSN: [id number]     Arrival date & time 11/15/14  1521 History   First MD Initiated Contact with Patient 11/15/14 1723     Chief Complaint  Patient presents with  . needs leg s wrapped      HPI  Patient presents with concern of ongoing swelling in both lower extremities, as well as wound on the right lateral calf. Patient was discharged 4 days ago from this facility after heart failure exacerbation.  he has a history of bilateral lower terminates edema, and this was worse during/hospitalization, along with new skin breakdown on the right lateral calf. The patient had problems with home health care yesterday, and was not able to have his wound evaluated. Since discharge she denies any fever, chills change in baseline dyspnea, chest pain, nausea, vomiting. No new loss of sensation or strength in his lower extremity.   Past Medical History  Diagnosis Date  . Diabetes mellitus   . Morbid obesity   . Lumbar herniated disc   . 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 Obesity, morbid, BMI 50 or higher 07/13/2012  . Hypertension 07/13/2012  . Diabetes mellitus 07/13/2012  . Back pain, chronic 07/13/2012  . Complication of anesthesia     " THEY LOST ME AND BROUGHT ME BACK "  . CHF (congestive heart failure)   . Irregular heart beat   . Shortness of breath   . Anticoagulated, on Xarelto, Chads2Vas score 2 10/02/2013   Past Surgical History  Procedure Laterality Date  . Abscess drainage    . Dental surgery    . Colonoscopy  08/22/2012    Procedure: COLONOSCOPY;  Surgeon: Shirley Friar, MD;  Location: [facility];  Service: Endoscopy;  Laterality: N/A;   No family history on file. History  Substance Use Topics  . Smoking status: Never Smoker   . Smokeless tobacco: Never Used  . Alcohol Use: No     Comment: rarely    Review of Systems  Constitutional:       Per HPI, otherwise negative  HENT:       Per HPI, otherwise negative  Respiratory:       Per HPI, otherwise negative    Cardiovascular:       Per HPI, otherwise negative  Gastrointestinal: Negative for vomiting.  Endocrine:       Negative aside from HPI  Genitourinary:       Neg aside from HPI   Musculoskeletal:       Per HPI, otherwise negative  Skin: Positive for wound.  Neurological: Negative for syncope.      Allergies  Review of patient's allergies indicates no known allergies.  Home Medications   Prior to Admission medications   Medication Sig Start Date End Date Taking? Authorizing Provider  clindamycin (CLEOCIN) 300 MG capsule Take 1 capsule (300 mg total) by mouth every 6 (six) hours. 11/11/14   Penny Pia, MD  diltiazem (CARDIZEM CD) 120 MG 24 hr capsule Take 1 capsule (120 mg total) by mouth daily. 11/11/14   Penny Pia, MD  furosemide (LASIX) 20 MG tablet take 3 tablets by mouth once daily 08/06/14   Vesta Mixer, MD  glipiZIDE (GLUCOTROL) 10 MG tablet Take 10 mg by mouth 2 (two) times daily before a meal.     Historical Provider, MD  metFORMIN (GLUCOPHAGE) 1000 MG tablet Take 1,000 mg by mouth 2 (two) times daily with a meal. 07/13/12   Sherrie George, PA-C  nitroGLYCERIN (NITROSTAT) 0.4 MG SL tablet Place 1  tablet (0.4 mg total) under the tongue every 5 (five) minutes x 3 doses as needed for chest pain. 10/07/13   Jodelle Gross, NP  oxyCODONE-acetaminophen (PERCOCET/ROXICET) 5-325 MG per tablet Take 1-2 tablets by mouth every 6 (six) hours as needed for moderate pain. 11/11/14   Penny Pia, MD  potassium chloride SA (K-DUR,KLOR-CON) 20 MEQ tablet take 1 tablet by mouth twice a day 08/06/14   Vesta Mixer, MD  ramipril (ALTACE) 2.5 MG capsule take 1 capsule by mouth twice a day 08/06/14   Vesta Mixer, MD  XARELTO 20 MG TABS tablet take 1 tablet by mouth once daily with SUPPER 07/31/14   Vesta Mixer, MD   BP 149/100 mmHg  Pulse 104  Temp(Src) 98.2 F (36.8 C) (Oral)  Resp 26  Wt 446 lb 5 oz (202.446 kg)  SpO2 99% Physical Exam  Constitutional: He is oriented to  person, place, and time. He appears well-developed. No distress.  Morbidly obese male resting in the gurney, awake, alert, interacting appropriately.  HENT:  Head: Normocephalic and atraumatic.  Eyes: Conjunctivae and EOM are normal.  Cardiovascular: Normal rate and regular rhythm.   Pulmonary/Chest: Effort normal. No stridor. No respiratory distress.  Abdominal: He exhibits no distension.  Musculoskeletal: He exhibits no edema.  Neurological: He is alert and oriented to person, place, and time.  Skin: Skin is warm and dry.     Psychiatric: He has a normal mood and affect.  Nursing note and vitals reviewed.   ED Course  Procedures (including critical care time) I reviewed the patient's chart including recent discharge summary.  After my initial evaluation I discussed patient's case with our care management team. Given the patient's difficult with home health care, the patient was scheduled for wound care center follow-up.   MDM   Final diagnoses:  Lower extremity ulceration, right, limited to breakdown of skin  patient presents with concern of bilateral lower extremity swelling, right lateral calf ulceration. Here, no evidence for sepsis or bacteremia, and the patient denies any changes in his baseline multiple medical issues. Wound itself looks noninfected, appropriately healing. Patient had his wound cleaned, redressed, wound care follow-up was arranged, and he was discharged in stable condition.  Gerhard Munch, MD 11/15/14 [phone]

## 2014-11-19 NOTE — Progress Notes (Addendum)
ED CM called [facility] wound clinic [phone]  to schedule appointment. Office will contact patient directly to schedule appointment. No further ED CM needs identified

## 2014-11-21 ENCOUNTER — Encounter: Payer: Self-pay | Admitting: Infectious Diseases

## 2014-11-21 ENCOUNTER — Ambulatory Visit (INDEPENDENT_AMBULATORY_CARE_PROVIDER_SITE_OTHER): Payer: Medicare Other | Admitting: Infectious Diseases

## 2014-11-21 DIAGNOSIS — L03115 Cellulitis of right lower limb: Secondary | ICD-10-CM

## 2014-11-21 DIAGNOSIS — E118 Type 2 diabetes mellitus with unspecified complications: Secondary | ICD-10-CM

## 2014-11-21 DIAGNOSIS — I10 Essential (primary) hypertension: Secondary | ICD-10-CM | POA: Diagnosis not present

## 2014-11-21 DIAGNOSIS — E1165 Type 2 diabetes mellitus with hyperglycemia: Secondary | ICD-10-CM

## 2014-11-21 DIAGNOSIS — IMO0002 Reserved for concepts with insufficient information to code with codable children: Secondary | ICD-10-CM

## 2014-11-21 NOTE — Assessment & Plan Note (Signed)
Needs better control, will f/u with PCP

## 2014-11-21 NOTE — Assessment & Plan Note (Signed)
There appears to be improved. Will try to get him seen in wound care clinic asap.  No further anbx at this point.  Needs control of his edema and wt as well. F/u prn.

## 2014-11-21 NOTE — Assessment & Plan Note (Signed)
Encourage to lose wt. F/u with PCP.

## 2014-11-21 NOTE — Assessment & Plan Note (Signed)
He will f/u with PCP and CHF team.

## 2014-11-21 NOTE — Progress Notes (Signed)
   Subjective:    Patient ID: Maurice Nguyen, male    DOB: 21-Apr-1966, 49 y.o.   MRN: [id number]  HPI 49 yo M with DM2 (~ 4 yrs), class IV CHF, afib (on xaralto), recent adm (4-14) for RLE cellulitis. He was d/c home (4-18) with 6 days of clinda as well as a "unaboot".  Has wound care clinic appt scheduled on 12-18-14. He is not able to see his wound. He has not been able to afford home care. He went to hospital 4-22 instead and had wound dressing changed there.  No f/c. Sweats "quite a bit".   PMH/Sochx reviewed.  He denies FHx of illnesses  States FSG have been 150s.  Has not had PCP f/u since d/c from hospital.   Review of Systems  Constitutional: Negative for fever, chills, appetite change and unexpected weight change.  Respiratory: Positive for shortness of breath.   Gastrointestinal: Negative for diarrhea and constipation.  Genitourinary: Negative for difficulty urinating.  Neurological: Positive for numbness.  DOE.  Can't judge his LE edema due to unaboot.      Objective:   Physical Exam  Constitutional:  Non-toxic appearance. He does not appear ill. No distress.  HENT:  Mouth/Throat: No oropharyngeal exudate.  Eyes: EOM are normal.  Neck: Neck supple.  Cardiovascular: Normal rate, regular rhythm and normal heart sounds.   Pulmonary/Chest: Effort normal and breath sounds normal.  Abdominal: Soft. Bowel sounds are normal. He exhibits no distension. There is no tenderness.  Musculoskeletal: He exhibits edema.       Left upper leg: He exhibits swelling and edema.       Legs: Lymphadenopathy:    He has no cervical adenopathy.          Assessment & Plan:

## 2014-11-25 ENCOUNTER — Encounter (HOSPITAL_BASED_OUTPATIENT_CLINIC_OR_DEPARTMENT_OTHER): Payer: Medicare Other | Attending: Plastic Surgery

## 2014-11-25 DIAGNOSIS — D689 Coagulation defect, unspecified: Secondary | ICD-10-CM | POA: Diagnosis not present

## 2014-11-25 DIAGNOSIS — Z09 Encounter for follow-up examination after completed treatment for conditions other than malignant neoplasm: Secondary | ICD-10-CM | POA: Insufficient documentation

## 2014-11-25 DIAGNOSIS — E11621 Type 2 diabetes mellitus with foot ulcer: Secondary | ICD-10-CM | POA: Diagnosis not present

## 2014-11-25 DIAGNOSIS — Z6841 Body Mass Index (BMI) 40.0 and over, adult: Secondary | ICD-10-CM | POA: Diagnosis not present

## 2014-11-25 DIAGNOSIS — R609 Edema, unspecified: Secondary | ICD-10-CM | POA: Diagnosis not present

## 2014-11-25 DIAGNOSIS — E669 Obesity, unspecified: Secondary | ICD-10-CM | POA: Diagnosis not present

## 2014-11-25 DIAGNOSIS — I87311 Chronic venous hypertension (idiopathic) with ulcer of right lower extremity: Secondary | ICD-10-CM | POA: Diagnosis not present

## 2014-11-25 DIAGNOSIS — I10 Essential (primary) hypertension: Secondary | ICD-10-CM | POA: Diagnosis not present

## 2014-11-25 DIAGNOSIS — I509 Heart failure, unspecified: Secondary | ICD-10-CM | POA: Insufficient documentation

## 2014-11-27 ENCOUNTER — Telehealth: Payer: Self-pay | Admitting: *Deleted

## 2014-11-27 NOTE — Telephone Encounter (Signed)
Called patient and notified him of appt with cardiology, [name], NP on 12/26/14 at 11:00 AM. Maurice Nguyen

## 2014-12-18 ENCOUNTER — Encounter (HOSPITAL_BASED_OUTPATIENT_CLINIC_OR_DEPARTMENT_OTHER): Payer: Medicare Other

## 2014-12-26 ENCOUNTER — Ambulatory Visit: Payer: Medicare Other | Admitting: Physician Assistant

## 2015-01-14 ENCOUNTER — Ambulatory Visit: Payer: Medicare Other | Admitting: Physician Assistant

## 2015-01-30 ENCOUNTER — Inpatient Hospital Stay (HOSPITAL_COMMUNITY)
Admission: EM | Admit: 2015-01-30 | Discharge: 2015-02-03 | DRG: 292 | Disposition: A | Discharge status: Home / Self Care | Payer: Medicare Other | Attending: Internal Medicine | Admitting: Internal Medicine

## 2015-01-30 ENCOUNTER — Encounter: Payer: Self-pay | Admitting: Cardiovascular Disease

## 2015-01-30 ENCOUNTER — Ambulatory Visit (INDEPENDENT_AMBULATORY_CARE_PROVIDER_SITE_OTHER): Payer: Medicare Other | Admitting: Cardiovascular Disease

## 2015-01-30 ENCOUNTER — Encounter (HOSPITAL_COMMUNITY): Payer: Self-pay | Admitting: *Deleted

## 2015-01-30 ENCOUNTER — Emergency Department (HOSPITAL_COMMUNITY): Payer: Medicare Other

## 2015-01-30 VITALS — BP 140/78 | HR 91 | Ht 74.0 in | Wt >= 6400 oz

## 2015-01-30 DIAGNOSIS — I10 Essential (primary) hypertension: Secondary | ICD-10-CM | POA: Diagnosis present

## 2015-01-30 DIAGNOSIS — I5041 Acute combined systolic (congestive) and diastolic (congestive) heart failure: Secondary | ICD-10-CM

## 2015-01-30 DIAGNOSIS — Z9114 Patient's other noncompliance with medication regimen: Secondary | ICD-10-CM | POA: Diagnosis present

## 2015-01-30 DIAGNOSIS — M549 Dorsalgia, unspecified: Secondary | ICD-10-CM | POA: Diagnosis present

## 2015-01-30 DIAGNOSIS — R0602 Shortness of breath: Secondary | ICD-10-CM | POA: Diagnosis not present

## 2015-01-30 DIAGNOSIS — G8929 Other chronic pain: Secondary | ICD-10-CM | POA: Diagnosis present

## 2015-01-30 DIAGNOSIS — I4891 Unspecified atrial fibrillation: Secondary | ICD-10-CM

## 2015-01-30 DIAGNOSIS — E119 Type 2 diabetes mellitus without complications: Secondary | ICD-10-CM | POA: Diagnosis present

## 2015-01-30 DIAGNOSIS — Z6841 Body Mass Index (BMI) 40.0 and over, adult: Secondary | ICD-10-CM

## 2015-01-30 DIAGNOSIS — I5023 Acute on chronic systolic (congestive) heart failure: Secondary | ICD-10-CM | POA: Diagnosis present

## 2015-01-30 DIAGNOSIS — Z79891 Long term (current) use of opiate analgesic: Secondary | ICD-10-CM

## 2015-01-30 DIAGNOSIS — Z7901 Long term (current) use of anticoagulants: Secondary | ICD-10-CM

## 2015-01-30 DIAGNOSIS — I160 Hypertensive urgency: Secondary | ICD-10-CM | POA: Diagnosis present

## 2015-01-30 DIAGNOSIS — I5021 Acute systolic (congestive) heart failure: Secondary | ICD-10-CM

## 2015-01-30 LAB — COMPREHENSIVE METABOLIC PANEL
ALT: 34 U/L (ref 17–63)
AST: 30 U/L (ref 15–41)
Albumin: 3.3 g/dL — ABNORMAL LOW (ref 3.5–5.0)
Alkaline Phosphatase: 55 U/L (ref 38–126)
Anion gap: 9 (ref 5–15)
BUN: 12 mg/dL (ref 6–20)
CO2: 23 mmol/L (ref 22–32)
Calcium: 8.5 mg/dL — ABNORMAL LOW (ref 8.9–10.3)
Chloride: 106 mmol/L (ref 101–111)
Creatinine, Ser: 1.18 mg/dL (ref 0.61–1.24)
GFR calc Af Amer: >60 mL/min (ref >60)
GFR calc non Af Amer: >60 mL/min (ref >60)
Glucose, Bld: 107 mg/dL — ABNORMAL HIGH (ref 65–99)
Potassium: 4 mmol/L (ref 3.5–5.1)
Sodium: 138 mmol/L (ref 135–145)
Total Bilirubin: 1.2 mg/dL (ref 0.3–1.2)
Total Protein: 6.7 g/dL (ref 6.5–8.1)

## 2015-01-30 LAB — TROPONIN I: Troponin I: <0.03 ng/mL (ref <0.031)

## 2015-01-30 LAB — CBG MONITORING, ED: Glucose-Capillary: 89 mg/dL (ref 65–99)

## 2015-01-30 MED ORDER — VALSARTAN 80 MG PO TABS: 80.0000 mg | ORAL_TABLET | Freq: Every day | ORAL | Status: DC

## 2015-01-30 MED ORDER — CARVEDILOL 6.25 MG PO TABS: 6.2500 mg | ORAL_TABLET | Freq: Two times a day (BID) | ORAL | Status: DC

## 2015-01-30 NOTE — ED Notes (Signed)
Pt with hx of chf c/o increased sob and LE edema x 3 days.  Was seen by Dr Ellard Artis sp today and was told to come to ED.

## 2015-01-30 NOTE — Progress Notes (Signed)
Maurice Nguyen Date of Birth: 1965/09/05 Medical Record #[id number]   Problem List 1. Combined systolic and diastolic congestive heart failure 2. Paroxysmal atrial fibrillation 3. Essential hypertension 4. Morbid obesity 5. Diabetes mellitus    History of Present Illness: [name] is seen back today for a post hospital visit.  - he was siiin  in consultation back in 2013. He is a 49 year old male with PAF, mixed CHF with systolic dysfunction, DM, chronic back pain, morbid obesity, HTN and non compliance. CHADSVAS of 3 (DM, HTN, CHF) with 3.2% estimated annual stroke risk.     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 Last seen by Dr. Excell Seltzer in January of 2014.   Most recently presented to [facility] with worsening edema and DOE. Noted to be in AF with RVR. Had ran out of his Lasix. His AF was difficult to control - now on CCB and high dose BB therapy. On xarelto. Diuresed down from 460 to 444.   Comes in today. Here with his girlfriend. Says he is feeling "great". Says his swelling has improved. Says he has scales at home - weight 444 to 447 at home. No chest pain. Tries to watch his salt. Not short of breath. Mostly limited by back pain. Says he can afford his medicine.   Dec. 17, 2015:  [name] is a 49 yo with hx of morbid obesity, Atrial fib, ruptured appendix.  I met him at [facility] in 2013.  This is his first OV with me. He had run out of his meds. Presented to the ER with shortness of breath.  Was restarted on lasix.  Has lost 14 lbs over the past 3 days.  Exercising some.   Has had leg swelling.  Was difficult for him to walk.    Echo in March, 2015: TDS: pooracoustic windows, poor sound wave transmission, and body habitus. Intravenous contrast (Definity) was administered. - Left ventricle: The cavity size was normal. There was moderate concentric hypertrophy. Systolic function was mildly reduced. The  estimated ejection fraction was in the range of 45% to 50%. Definity contrast was administered which did not aid in wall motion or EF determination. The study is not technically sufficient to allow evaluation of LV diastolic function. - Mitral valve: Poorly visualized. Mild regurgitation. - Left atrium: The atrium was mildly dilated. - Inferior vena cava: The vessel was dilated; the respirophasic diameter changes were blunted (< 50%); findings are consistent with elevated central venous pressure. - Pericardium, extracardiac: There was no pericardial effusion.    He is doing well.    01/30/2015:  Is not taking his meds.  Has had some rectal bleeding, coughing.  So he stopped taking his heart meds - still took his diabetic meds and lasix .  Has been very short of breath for the past 2 - 3 days   Was in the hospital in April , 2016 for leg edema and ulceration .  O2 sats are 95% today at rest.   Current Outpatient Prescriptions  Medication Sig Dispense Refill  . furosemide (LASIX) 20 MG tablet take 3 tablets by mouth once daily 90 tablet 3  . glipiZIDE (GLUCOTROL) 10 MG tablet Take 10 mg by mouth 2 (two) times daily before a meal.     . metFORMIN (GLUCOPHAGE) 1000 MG tablet Take 1,000 mg by mouth 2 (two) times daily with a meal.    . nitroGLYCERIN (NITROSTAT) 0.4 MG SL tablet  Place 1 tablet (0.4 mg total) under the tongue every 5 (five) minutes x 3 doses as needed for chest pain. 30 tablet 12  . cyclobenzaprine (FLEXERIL) 10 MG tablet Take 10 mg by mouth 2 (two) times daily.  0  . diltiazem (CARDIZEM CD) 120 MG 24 hr capsule Take 1 capsule (120 mg total) by mouth daily. (Patient not taking: Reported on 01/30/2015) 30 capsule 0  . oxyCODONE-acetaminophen (PERCOCET/ROXICET) 5-325 MG per tablet Take 1-2 tablets by mouth every 6 (six) hours as needed for moderate pain. (Patient not taking: Reported on 01/30/2015) 30 tablet 0  . potassium chloride SA (K-DUR,KLOR-CON) 20 MEQ  tablet take 1 tablet by mouth twice a day (Patient not taking: Reported on 01/30/2015) 60 tablet 3  . ramipril (ALTACE) 2.5 MG capsule take 1 capsule by mouth twice a day (Patient not taking: Reported on 01/30/2015) 30 capsule 3  . XARELTO 20 MG TABS tablet take 1 tablet by mouth once daily with SUPPER (Patient not taking: Reported on 01/30/2015) 30 tablet 6   No current facility-administered medications for this visit.    No Known Allergies  Past Medical History  Diagnosis Date  . Diabetes mellitus   . Morbid obesity   . Lumbar herniated disc   . 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 Obesity, morbid, BMI 50 or higher 07/13/2012  . Hypertension 07/13/2012  . Diabetes mellitus 07/13/2012  . Back pain, chronic 07/13/2012  . Complication of anesthesia     " THEY LOST ME AND BROUGHT ME BACK "  . CHF (congestive heart failure)   . Irregular heart beat   . Shortness of breath   . Anticoagulated, on Xarelto, Chads2Vas score 2 10/02/2013    Past Surgical History  Procedure Laterality Date  . Abscess drainage    . Dental surgery    . Colonoscopy  08/22/2012    Procedure: COLONOSCOPY;  Surgeon: Lear Ng, MD;  Location: [facility];  Service: Endoscopy;  Laterality: N/A;    History  Smoking status  . Never Smoker   Smokeless tobacco  . Never Used    History  Alcohol Use No    Comment: rarely    History reviewed. No pertinent family history.  Review of Systems: The review of systems is per the HPI.  All other systems were reviewed and are negative.  Physical Exam: BP 140/78 mmHg  Pulse 91  Ht _0  (1.88 m)  Wt 209.108 kg (461 lb)  BMI 59.16 kg/m2  SpO2 95% Patient is massively obese but in no acute distress. Skin is -diaphoritic.    Color is normal.  HEENT is unremarkable. Normocephalic/atraumatic.  PERRL. Sclera are nonicteric. Neck is supple. No masses. No JVD.  Lungs are clear. Cardiac exam shows an irregular rhythm. Rate is ok. Heart tones quite distant. Abdomen  is soft. Extremities are quite full with 3+ edema.  Chronic stasis changes  Gait and ROM are intact.  He had significant dyspnea with walking back to the exam room   No gross neurologic deficits noted.   Wt Readings from Last 3 Encounters:  01/30/15 209.108 kg (461 lb)  11/21/14 200.036 kg (441 lb)  11/15/14 202.446 kg (446 lb 5 oz)     LABORATORY DATA: EKG today with AF with rate of 106.    Lab Results  Component Value Date   WBC 8.3 11/11/2014   HGB 13.1 11/11/2014   HCT 39.6 11/11/2014   PLT 233 11/11/2014   GLUCOSE 185* 11/11/2014   CHOL  02/11/2010    159        ATP III CLASSIFICATION:  <200     mg/dL   Desirable  200-239  mg/dL   Borderline High  >=240    mg/dL   High          TRIG 58 02/11/2010   HDL 59 02/11/2010   LDLCALC  02/11/2010    88        Total Cholesterol/HDL:CHD Risk Coronary Heart Disease Risk Table                     Men   Women  1/2 Average Risk   3.4   3.3  Average Risk       5.0   4.4  2 X Average Risk   9.6   7.1  3 X Average Risk  23.4   11.0        Use the calculated Patient Ratio above and the CHD Risk Table to determine the patient's CHD Risk.        ATP III CLASSIFICATION (LDL):  <100     mg/dL   Optimal  100-129  mg/dL   Near or Above                    Optimal  130-159  mg/dL   Borderline  160-189  mg/dL   High  >190     mg/dL   Very High   ALT 15 11/11/2014   AST 13 11/11/2014   NA 134* 11/11/2014   K 4.3 11/11/2014   CL 98 11/11/2014   CREATININE 1.01 11/11/2014   BUN 12 11/11/2014   CO2 24 11/11/2014   TSH 2.421 09/27/2013   PSA 0.22 Test Methodology: [employer] PSA 02/11/2010   INR 1.04 09/27/2013   HGBA1C 8.6* 11/07/2014   MICROALBUR 0.99 02/11/2010     Echo Study Conclusions from March 2015  - Procedure narrative: Transthoracic echocardiography. The study was technically difficult, as a result of poor acoustic windows, poor sound wave transmission, and body habitus. Intravenous contrast (Definity) was  administered. - Left ventricle: The cavity size was normal. There was moderate concentric hypertrophy. Systolic function was mildly reduced. The estimated ejection fraction was in the range of 45% to 50%. Definity contrast was administered which did not aid in wall motion or EF determination. The study is not technically sufficient to allow evaluation of LV diastolic function. - Mitral valve: Poorly visualized. Mild regurgitation. - Left atrium: The atrium was mildly dilated. - Inferior vena cava: The vessel was dilated; the respirophasic diameter changes were blunted (< 50%); findings are consistent with elevated central venous pressure. - Pericardium, extracardiac: There was no pericardial effusion.   Assessment / Plan:  1. Combined systolic and diastolic congestive heart failure - off the diltiazem and Altace.   Developed a cough - maybe due to altace. Will DC the Dilt and altace. Start Coreg 6. 25 BID and valsartan 80 a day   He's feeling very poorly today. He had significant shortness of breath on walking back to the exam room. He may need admission for diuresis. I've encouraged him to go to the emergency room if he feels like he needs to go to the hospital.  His lack of compliance is certainly hindering his medical therapy.  I've encouraged him to call his doctor for complications instead of stopping meds on his own.   2. Paroxysmal atrial fibrillation - still in atrial fibrillation. He stopped the Xarelto on  his own because of bleeding from his rectum. He's not sure that he's having blood in stool or perhaps it was hemorrhoids. I've encouraged him to continue the Xarelto. He may need to be checked by his medical doctor.  3. Essential hypertension - will start valsartan 80 mg a day. 4. Morbid obesity 5. Diabetes mellitus    Nahser, Wonda Cheng, MD  01/30/2015 8:51 AM    [facility] [place],  [address] [place], [place]  [address] Pager [phone] Phone: [phone]; Fax: [phone]   [facility]  [address] [place], [place]  [address] [phone]    Fax [phone]

## 2015-01-30 NOTE — ED Provider Notes (Signed)
CSN: [id number]     Arrival date & time 01/30/15  1840 History    This chart was scribed for Derwood Kaplan, MD by Arlan Organ, ED Scribe. This patient was seen in room A07C/A07C and the patient's care was started 11:55 PM.   Chief Complaint  Patient presents with  . Shortness of Breath  . Leg Swelling   The history is provided by the patient and a relative. No language interpreter was used.    HPI Comments: Maurice Nguyen is a 49 y.o. male with a PMHx of CHF, HTN, and DM who presents to the Emergency Department complaining of constant, ongoing, worsening shortness of breath x 2-3 days. He also reports constant, ongoing lower extremity swelling. Pt has a hospital bed at home and typically sleeps sitting up right to help manage his breathing. Maurice Nguyen was evaluated by Dr. Nahser-cardiologist earlier this afternoon with concern for ongoing, worsening shortness of breath with exertion and lower extremity swelling. However, pt admits he stopped taking his Xarelto after 3 episodes of rectal bleeding in April of this year that has now resolved. Pt attributed bleeding and new onset hoarseness to medications resulting in him stopping meds on his own without recommendation from cardiologist. He states that he has been compliant with his lasix. No recent fever, chills, chest pain, or abdominal pain. He is currently on Xarelto at home for history of A-Fib. Pt is currently on disability for back issues. No known allergies to medications.   ROS 10 Systems reviewed and are negative for acute change except as noted in the HPI.     Past Medical History  Diagnosis Date  . Diabetes mellitus   . Morbid obesity   . Lumbar herniated disc   . 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 Obesity, morbid, BMI 50 or higher 07/13/2012  . Hypertension 07/13/2012  . Diabetes mellitus 07/13/2012  . Back pain, chronic 07/13/2012  . Complication of anesthesia     " THEY LOST ME AND BROUGHT ME BACK "  . CHF  (congestive heart failure)   . Irregular heart beat   . Shortness of breath   . Anticoagulated, on Xarelto, Chads2Vas score 2 10/02/2013   Past Surgical History  Procedure Laterality Date  . Abscess drainage    . Dental surgery    . Colonoscopy  08/22/2012    Procedure: COLONOSCOPY;  Surgeon: Shirley Friar, MD;  Location: [facility];  Service: Endoscopy;  Laterality: N/A;   No family history on file. History  Substance Use Topics  . Smoking status: Never Smoker   . Smokeless tobacco: Never Used  . Alcohol Use: No     Comment: rarely    Review of Systems  Constitutional: Negative for fever and chills.  Respiratory: Positive for shortness of breath.   Cardiovascular: Positive for leg swelling.  Gastrointestinal: Negative for nausea, vomiting and abdominal pain.  Neurological: Negative for dizziness and weakness.  Psychiatric/Behavioral: Negative for confusion.  All other systems reviewed and are negative.     Allergies  Review of patient's allergies indicates no known allergies.  Home Medications   Prior to Admission medications   Medication Sig Start Date End Date Taking? Authorizing Provider  carvedilol (COREG) 6.25 MG tablet Take 1 tablet (6.25 mg total) by mouth 2 (two) times daily. 01/30/15   Vesta Mixer, MD  cyclobenzaprine (FLEXERIL) 10 MG tablet Take 10 mg by mouth 2 (two) times daily. 01/02/15   Historical Provider, MD  furosemide (LASIX) 20 MG tablet take  3 tablets by mouth once daily 08/06/14   Vesta Mixer, MD  glipiZIDE (GLUCOTROL) 10 MG tablet Take 10 mg by mouth 2 (two) times daily before a meal.     Historical Provider, MD  metFORMIN (GLUCOPHAGE) 1000 MG tablet Take 1,000 mg by mouth 2 (two) times daily with a meal. 07/13/12   Sherrie George, PA-C  nitroGLYCERIN (NITROSTAT) 0.4 MG SL tablet Place 1 tablet (0.4 mg total) under the tongue every 5 (five) minutes x 3 doses as needed for chest pain. 10/07/13   Jodelle Gross, NP   oxyCODONE-acetaminophen (PERCOCET/ROXICET) 5-325 MG per tablet Take 1-2 tablets by mouth every 6 (six) hours as needed for moderate pain. Patient not taking: Reported on 01/30/2015 11/11/14   Penny Pia, MD  potassium chloride SA (K-DUR,KLOR-CON) 20 MEQ tablet take 1 tablet by mouth twice a day Patient not taking: Reported on 01/30/2015 08/06/14   Vesta Mixer, MD  valsartan (DIOVAN) 80 MG tablet Take 1 tablet (80 mg total) by mouth daily. 01/30/15   Vesta Mixer, MD  XARELTO 20 MG TABS tablet take 1 tablet by mouth once daily with SUPPER Patient not taking: Reported on 01/30/2015 07/31/14   Vesta Mixer, MD   Triage Vitals: BP 159/115 mmHg  Pulse 100  Temp(Src) 98.8 F (37.1 C) (Oral)  Resp 9  Wt 457 lb 7 oz (207.492 kg)  SpO2 97%   Physical Exam  Constitutional: He is oriented to person, place, and time. He appears well-developed and well-nourished.  HENT:  Head: Normocephalic and atraumatic.  Eyes: EOM are normal.  Neck: Normal range of motion. JVD present.  Cardiovascular: Regular rhythm, normal heart sounds and intact distal pulses.  Tachycardia present.   No murmur heard. Pulmonary/Chest: Effort normal and breath sounds normal. No respiratory distress.  Lungs are clear to ausculation, + poor entry at the base.  Abdominal: Soft. Bowel sounds are normal. He exhibits no distension. There is no tenderness.  Positive bowel sounds  Musculoskeletal: Normal range of motion. He exhibits edema.  2 plus pitting edema to lower extremities No warmth to touch  Neurological: He is alert and oriented to person, place, and time.  Skin: Skin is warm and dry.  Psychiatric: He has a normal mood and affect. Judgment normal.  Nursing note and vitals reviewed.   ED Course  Procedures (including critical care time)  DIAGNOSTIC STUDIES: Oxygen Saturation is 98% on RA, Normal by my interpretation.    COORDINATION OF CARE: 12:03 AM- Will order CXR, CBC, troponin I, CMP, EKG, and BNP.  Discussed treatment plan with pt at bedside and pt agreed to plan.     Labs Review Labs Reviewed  COMPREHENSIVE METABOLIC PANEL - Abnormal; Notable for the following:    Glucose, Bld 107 (*)    Calcium 8.5 (*)    Albumin 3.3 (*)    All other components within normal limits  BRAIN NATRIURETIC PEPTIDE - Abnormal; Notable for the following:    B Natriuretic Peptide 628.7 (*)    All other components within normal limits  TROPONIN I  CBC  CBG MONITORING, ED    Imaging Review Dg Chest 2 View  01/30/2015   CLINICAL DATA:  Shortness of Breath; lower extremity edema  EXAM: CHEST  2 VIEW  COMPARISON:  November 07, 2014  FINDINGS: There is interstitial and patchy alveolar edema in both lung bases. There is cardiomegaly with mild pulmonary venous hypertension. There is no adenopathy.  IMPRESSION: Findings consistent with congestive heart failure.  Superimposed pneumonia in the bases cannot be excluded radiographically.   Electronically Signed   By: Bretta Bang III M.D.   On: 01/30/2015 19:28     EKG Interpretation   Date/Time:  Thursday January 30 2015 18:46:33 EDT Ventricular Rate:  108 PR Interval:    QRS Duration: 80 QT Interval:  348 QTC Calculation: 466 R Axis:   -73 Text Interpretation:  Atrial fibrillation with rapid ventricular response  with premature ventricular or aberrantly conducted complexes Left axis  deviation Anteroseptal infarct , age undetermined Abnormal ECG No  significant change since last tracing Confirmed by Rhunette Croft, MD, Janey Genta  [phone]) on 01/31/2015 12:08:11 AM      MDM   Final diagnoses:  Acute systolic heart failure    I personally performed the services described in this documentation, which was scribed in my presence. The recorded information has been reviewed and is accurate.  Pt comes in with cc of dib and worsening leg swelling. He has orthopnea and worsening PND and also exertional dyspnea w/o chest pain. PT has CHF - EF 45%. He has pitting edema in  his legs. Exam and CXR consistent with acute CHF exacerbation as well. Will admit for optimization. Pt had stopped taking his xarelto and might have non compliance with her meds, so the admission will benefit him in getting his regimen back to normal as well.    Derwood Kaplan, MD 01/31/15 0104

## 2015-01-30 NOTE — Patient Instructions (Signed)
Medication Instructions:  STOP Altace STOP Diltiazem START Carvedilol (Coreg) 6.25 mg twice daily - take 12 hours apart START Valsartan 80 mg once daily  Labwork: None Ordered  Testing/Procedures: None Ordered  Follow-Up: Your physician wants you to follow-up in: 3 months with Dr. Elease Hashimoto.  You will receive a reminder letter in the mail two months in advance. If you don't receive a letter, please call our office to schedule the follow-up appointment.

## 2015-01-31 ENCOUNTER — Inpatient Hospital Stay (HOSPITAL_COMMUNITY): Payer: Medicare Other

## 2015-01-31 DIAGNOSIS — I10 Essential (primary) hypertension: Secondary | ICD-10-CM

## 2015-01-31 DIAGNOSIS — I5041 Acute combined systolic (congestive) and diastolic (congestive) heart failure: Secondary | ICD-10-CM | POA: Diagnosis present

## 2015-01-31 DIAGNOSIS — G8929 Other chronic pain: Secondary | ICD-10-CM | POA: Diagnosis present

## 2015-01-31 DIAGNOSIS — Z7901 Long term (current) use of anticoagulants: Secondary | ICD-10-CM | POA: Diagnosis not present

## 2015-01-31 DIAGNOSIS — I5023 Acute on chronic systolic (congestive) heart failure: Secondary | ICD-10-CM | POA: Diagnosis present

## 2015-01-31 DIAGNOSIS — I509 Heart failure, unspecified: Secondary | ICD-10-CM

## 2015-01-31 DIAGNOSIS — Z6841 Body Mass Index (BMI) 40.0 and over, adult: Secondary | ICD-10-CM | POA: Diagnosis not present

## 2015-01-31 DIAGNOSIS — I4891 Unspecified atrial fibrillation: Secondary | ICD-10-CM | POA: Diagnosis present

## 2015-01-31 DIAGNOSIS — Z9114 Patient's other noncompliance with medication regimen: Secondary | ICD-10-CM | POA: Diagnosis present

## 2015-01-31 DIAGNOSIS — I482 Chronic atrial fibrillation: Secondary | ICD-10-CM | POA: Diagnosis not present

## 2015-01-31 DIAGNOSIS — Z79891 Long term (current) use of opiate analgesic: Secondary | ICD-10-CM | POA: Diagnosis not present

## 2015-01-31 DIAGNOSIS — E119 Type 2 diabetes mellitus without complications: Secondary | ICD-10-CM

## 2015-01-31 DIAGNOSIS — M549 Dorsalgia, unspecified: Secondary | ICD-10-CM | POA: Diagnosis present

## 2015-01-31 DIAGNOSIS — R0602 Shortness of breath: Secondary | ICD-10-CM | POA: Diagnosis present

## 2015-01-31 LAB — GLUCOSE, CAPILLARY
Glucose-Capillary: 157 mg/dL — ABNORMAL HIGH (ref 65–99)
Glucose-Capillary: 173 mg/dL — ABNORMAL HIGH (ref 65–99)
Glucose-Capillary: 195 mg/dL — ABNORMAL HIGH (ref 65–99)
Glucose-Capillary: 123 mg/dL — ABNORMAL HIGH (ref 65–99)
Glucose-Capillary: 125 mg/dL — ABNORMAL HIGH (ref 65–99)

## 2015-01-31 LAB — CBC
HCT: 42.1 % (ref 39.0–52.0)
Hemoglobin: 13.8 g/dL (ref 13.0–17.0)
MCH: 31.3 pg (ref 26.0–34.0)
MCHC: 32.8 g/dL (ref 30.0–36.0)
MCV: 95.5 fL (ref 78.0–100.0)
Platelets: 216 10*3/uL (ref 150–400)
RBC: 4.41 MIL/uL (ref 4.22–5.81)
RDW: 14.7 % (ref 11.5–15.5)
WBC: 7.2 10*3/uL (ref 4.0–10.5)

## 2015-01-31 LAB — BRAIN NATRIURETIC PEPTIDE: B Natriuretic Peptide: 628.7 pg/mL — ABNORMAL HIGH (ref 0.0–100.0)

## 2015-01-31 MED ORDER — SODIUM CHLORIDE 0.9 % IV SOLN: 250.0000 mL | INTRAVENOUS | Status: DC | PRN

## 2015-01-31 MED ORDER — SODIUM CHLORIDE 0.9 % IJ SOLN
3.0000 mL | Freq: Two times a day (BID) | INTRAMUSCULAR | Status: DC
  Administered 2015-01-31 – 2015-02-03 (×7): 3 mL via INTRAVENOUS

## 2015-01-31 MED ORDER — INSULIN ASPART 100 UNIT/ML ~~LOC~~ SOLN
0.0000 [IU] | Freq: Three times a day (TID) | SUBCUTANEOUS | Status: DC
  Administered 2015-01-31: 2 [IU] via SUBCUTANEOUS
  Administered 2015-01-31 – 2015-02-01 (×3): 3 [IU] via SUBCUTANEOUS
  Administered 2015-02-01: 2 [IU] via SUBCUTANEOUS
  Administered 2015-02-01: 3 [IU] via SUBCUTANEOUS
  Administered 2015-02-02: 2 [IU] via SUBCUTANEOUS
  Administered 2015-02-02: 3 [IU] via SUBCUTANEOUS
  Administered 2015-02-02: 2 [IU] via SUBCUTANEOUS
  Administered 2015-02-03: 3 [IU] via SUBCUTANEOUS
  Administered 2015-02-03: 2 [IU] via SUBCUTANEOUS

## 2015-01-31 MED ORDER — POTASSIUM CHLORIDE CRYS ER 20 MEQ PO TBCR
20.0000 meq | EXTENDED_RELEASE_TABLET | Freq: Two times a day (BID) | ORAL | Status: DC
  Administered 2015-01-31 – 2015-02-03 (×7): 20 meq via ORAL
  Filled 2015-01-31 (×10): qty 1

## 2015-01-31 MED ORDER — SODIUM CHLORIDE 0.9 % IJ SOLN
3.0000 mL | INTRAMUSCULAR | Status: DC | PRN
  Administered 2015-02-01: 3 mL via INTRAVENOUS
  Filled 2015-01-31: qty 3

## 2015-01-31 MED ORDER — FUROSEMIDE 10 MG/ML IJ SOLN
80.0000 mg | Freq: Two times a day (BID) | INTRAMUSCULAR | Status: DC
  Administered 2015-01-31: 80 mg via INTRAVENOUS
  Filled 2015-01-31 (×2): qty 8

## 2015-01-31 MED ORDER — FUROSEMIDE 10 MG/ML IJ SOLN
80.0000 mg | Freq: Every day | INTRAMUSCULAR | Status: DC
  Administered 2015-02-01: 80 mg via INTRAVENOUS
  Filled 2015-01-31 (×2): qty 8

## 2015-01-31 MED ORDER — GLIPIZIDE 10 MG PO TABS
10.0000 mg | ORAL_TABLET | Freq: Two times a day (BID) | ORAL | Status: DC
  Administered 2015-01-31 – 2015-02-03 (×6): 10 mg via ORAL
  Filled 2015-01-31 (×8): qty 1

## 2015-01-31 MED ORDER — RIVAROXABAN 20 MG PO TABS
20.0000 mg | ORAL_TABLET | Freq: Every day | ORAL | Status: DC
  Administered 2015-01-31 – 2015-02-02 (×3): 20 mg via ORAL
  Filled 2015-01-31 (×4): qty 1

## 2015-01-31 MED ORDER — OXYCODONE-ACETAMINOPHEN 5-325 MG PO TABS
1.0000 | ORAL_TABLET | Freq: Four times a day (QID) | ORAL | Status: DC | PRN
  Administered 2015-01-31 – 2015-02-01 (×6): 2 via ORAL
  Administered 2015-02-01: 1 via ORAL
  Administered 2015-02-02 – 2015-02-03 (×6): 2 via ORAL
  Filled 2015-01-31 (×13): qty 2

## 2015-01-31 MED ORDER — NITROGLYCERIN 0.4 MG SL SUBL: 0.4000 mg | SUBLINGUAL_TABLET | SUBLINGUAL | Status: DC | PRN

## 2015-01-31 MED ORDER — CARVEDILOL 6.25 MG PO TABS
6.2500 mg | ORAL_TABLET | Freq: Two times a day (BID) | ORAL | Status: DC
  Administered 2015-01-31 – 2015-02-03 (×7): 6.25 mg via ORAL
  Filled 2015-01-31 (×11): qty 1

## 2015-01-31 MED ORDER — FUROSEMIDE 10 MG/ML IJ SOLN
20.0000 mg | Freq: Once | INTRAMUSCULAR | Status: AC
  Administered 2015-01-31: 20 mg via INTRAVENOUS
  Filled 2015-01-31: qty 2

## 2015-01-31 MED ORDER — ACETAMINOPHEN 325 MG PO TABS: 650.0000 mg | ORAL_TABLET | ORAL | Status: DC | PRN

## 2015-01-31 MED ORDER — FUROSEMIDE 10 MG/ML IJ SOLN
60.0000 mg | Freq: Once | INTRAMUSCULAR | Status: AC
  Administered 2015-01-31: 60 mg via INTRAVENOUS
  Filled 2015-01-31: qty 6

## 2015-01-31 MED ORDER — ONDANSETRON HCL 4 MG/2ML IJ SOLN: 4.0000 mg | Freq: Four times a day (QID) | INTRAMUSCULAR | Status: DC | PRN

## 2015-01-31 MED ORDER — INSULIN ASPART 100 UNIT/ML ~~LOC~~ SOLN: 0.0000 [IU] | Freq: Every day | SUBCUTANEOUS | Status: DC

## 2015-01-31 MED ORDER — HYDRALAZINE HCL 20 MG/ML IJ SOLN: 10.0000 mg | Freq: Four times a day (QID) | INTRAMUSCULAR | Status: DC | PRN

## 2015-01-31 MED ORDER — IRBESARTAN 75 MG PO TABS
75.0000 mg | ORAL_TABLET | Freq: Every day | ORAL | Status: DC
  Administered 2015-01-31 – 2015-02-03 (×4): 75 mg via ORAL
  Filled 2015-01-31 (×5): qty 1

## 2015-01-31 NOTE — Discharge Instructions (Signed)

## 2015-01-31 NOTE — H&P (Addendum)
[facility] History and Physical  Maurice Nguyen ZOX:[id number] DOB: 04-25-1966 DOA: 01/30/2015  Referring physician: Dr Rhunette Croft PCP: Burtis Junes, MD   Chief Complaint:  Shortness fo breath x3 days  HPI:  49 year old male with history of morbid obesity, CHF with EF of 45-50% as per echo from 09/2013, A. fib on Xarelto (stopped taking it 3 months back due to 3 episodes of rectal bleed and cough), chronic low back pain due to herniated lumbar disc, hypertension who was seen by his cardiologist on 7/7 for a follow-up and complained of progressive shortness of breath on minimal exertion with orthopnea and PND for the past 3 days. Patient reports taking his Lasix as prescribed. He noticed increased bilateral leg swellings. His cardiologist  ( Dr Elease Hashimoto) switched diltiazem to Coreg and Altace to valsartan. Patient denies headache, dizziness, fever, chills, nausea , vomiting, chest pain, palpitations,  abdominal pain, bowel or urinary symptoms. Reports that he has gained several pounds. As per wife at bedside his baseline weight is on 420 pounds and he is about 457 pounds today. At baseline he is able to ambulate a few blocks without difficulty. Reports that he has not taken his other blood pressure medications besides Lasix and he is not compliant with his diet as well.  Course in the ED  Patient had elevated blood pressure of 152/105 mmHg. His heart rate was in low 100s and irregular. Remaining vitals were stable. Blood work showed normal CBC and chemistry. BNP was elevated in the 600s. Chest x-ray showed findings of congestive heart failure. Patient given 20 mg IV Lasix and hospitalist admission requested to telemetry.  Review of Systems:  Constitutional: Denies fever, chills, diaphoresis, appetite change and fatigue.  HEENT: Dry cough, Denies visual or hearing symptoms, congestion,   difficulty swallowing, neck pain or stiffness Respiratory:  SOB, DOE, cough, denies chest  tightness,  and wheezing.   Cardiovascular: Denies chest pain, palpitations, leg swelling+. , orthopnea, PND Gastrointestinal: Denies nausea, vomiting, abdominal pain, diarrhea, constipation, blood in stool and abdominal distention.  Genitourinary: Denies dysuria, , hematuria, flank pain and difficulty urinating.  Endocrine: Denies: hot or cold intolerance, polyuria, polydipsia. Musculoskeletal: Denies myalgias, chronic back pain +, denies joint pain or swelling Skin: Denies pallor, rash and wound.  Neurological: Denies dizziness,syncope, weakness, light-headedness, numbness and headaches.  Hematological: Denies adenopathy.  Psychiatric/Behavioral: Denies confusion    Past Medical History  Diagnosis Date  . Diabetes mellitus   . Morbid obesity   . Lumbar herniated disc   . 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 Obesity, morbid, BMI 50 or higher 07/13/2012  . Hypertension 07/13/2012  . Diabetes mellitus 07/13/2012  . Back pain, chronic 07/13/2012  . Complication of anesthesia     " THEY LOST ME AND BROUGHT ME BACK "  . CHF (congestive heart failure)   . Irregular heart beat   . Shortness of breath   . Anticoagulated, on Xarelto, Chads2Vas score 2 10/02/2013   Past Surgical History  Procedure Laterality Date  . Abscess drainage    . Dental surgery    . Colonoscopy  08/22/2012    Procedure: COLONOSCOPY;  Surgeon: Shirley Friar, MD;  Location: [facility];  Service: Endoscopy;  Laterality: N/A;   Social History:  reports that he has never smoked. He has never used smokeless tobacco. He reports that he does not drink alcohol or use illicit drugs.  No Known Allergies  No family history on file.  Prior to Admission medications   Medication Sig Start  Date End Date Taking? Authorizing Provider  furosemide (LASIX) 20 MG tablet take 3 tablets by mouth once daily 08/06/14  Yes Vesta Mixer, MD  glipiZIDE (GLUCOTROL) 10 MG tablet Take 10 mg by mouth 2 (two) times daily before a  meal.    Yes Historical Provider, MD  metFORMIN (GLUCOPHAGE) 1000 MG tablet Take 1,000 mg by mouth 2 (two) times daily with a meal. 07/13/12  Yes Sherrie George, PA-C  naproxen sodium (ANAPROX) 220 MG tablet Take 660 mg by mouth 2 (two) times daily as needed (pain).   Yes Historical Provider, MD  nitroGLYCERIN (NITROSTAT) 0.4 MG SL tablet Place 1 tablet (0.4 mg total) under the tongue every 5 (five) minutes x 3 doses as needed for chest pain. 10/07/13  Yes Jodelle Gross, NP  carvedilol (COREG) 6.25 MG tablet Take 1 tablet (6.25 mg total) by mouth 2 (two) times daily. Patient not taking: Reported on 01/31/2015 01/30/15   Vesta Mixer, MD  oxyCODONE-acetaminophen (PERCOCET/ROXICET) 5-325 MG per tablet Take 1-2 tablets by mouth every 6 (six) hours as needed for moderate pain. Patient not taking: Reported on 01/30/2015 11/11/14   Penny Pia, MD  potassium chloride SA (K-DUR,KLOR-CON) 20 MEQ tablet take 1 tablet by mouth twice a day Patient not taking: Reported on 01/30/2015 08/06/14   Vesta Mixer, MD  valsartan (DIOVAN) 80 MG tablet Take 1 tablet (80 mg total) by mouth daily. Patient not taking: Reported on 01/31/2015 01/30/15   Vesta Mixer, MD  XARELTO 20 MG TABS tablet take 1 tablet by mouth once daily with SUPPER Patient not taking: Reported on 01/30/2015 07/31/14   Vesta Mixer, MD     Physical Exam:  Filed Vitals:   01/31/15 0000 01/31/15 0015 01/31/15 0035 01/31/15 0100  BP: 160/100 154/97  152/105  Pulse: 103   71  Temp:      TempSrc:      Resp:  24  22  Weight:   207.492 kg (457 lb 7 oz)   SpO2: 97% 100%  97%    Constitutional: Vital signs reviewed.  Middle aged morbidly obese male in no acute distress HEENT: no pallor, no icterus, moist oral mucosa, no cervical lymphadenopathy, JVD + Cardiovascular: S1 and S2 is irregularly irregular, no murmurs rub or gallop Chest: Diminished bibasilar breath sounds due to body habitus, Abdominal: Soft. Non-tender, non-distended, bowel  sounds are normal,  Ext: warm, 2+ pitting edema bilaterally  Neurological: A&O x3, non focal  Labs on Admission:  Basic Metabolic Panel:  Recent Labs Lab 01/30/15 1916  NA 138  K 4.0  CL 106  CO2 23  GLUCOSE 107*  BUN 12  CREATININE 1.18  CALCIUM 8.5*   Liver Function Tests:  Recent Labs Lab 01/30/15 1916  AST 30  ALT 34  ALKPHOS 55  BILITOT 1.2  PROT 6.7  ALBUMIN 3.3*   No results for input(s): LIPASE, AMYLASE in the last 168 hours. No results for input(s): AMMONIA in the last 168 hours. CBC:  Recent Labs Lab 01/30/15 2358  WBC 7.2  HGB 13.8  HCT 42.1  MCV 95.5  PLT 216   Cardiac Enzymes:  Recent Labs Lab 01/30/15 1916  TROPONINI <0.03   BNP: Invalid input(s): POCBNP CBG:  Recent Labs Lab 01/30/15 2055  GLUCAP 89    Radiological Exams on Admission: Dg Chest 2 View  01/30/2015   CLINICAL DATA:  Shortness of Breath; lower extremity edema  EXAM: CHEST  2 VIEW  COMPARISON:  November 07, 2014  FINDINGS: There is interstitial and patchy alveolar edema in both lung bases. There is cardiomegaly with mild pulmonary venous hypertension. There is no adenopathy.  IMPRESSION: Findings consistent with congestive heart failure. Superimposed pneumonia in the bases cannot be excluded radiographically.   Electronically Signed   By: Bretta Bang III M.D.   On: 01/30/2015 19:28    EKG: afib with RVR@108 , no ST-T changes  Assessment/Plan    Principal problem Acute combined systolic and diastolic CHF exacerbation secondary to dietary non adherence and medication non compliance. -Admit to telemetry -Start on IV lasix 80 mg bid. received 80 mg in ED. Monitor daily weight and strict I/O -Check 2D echo. -continue BB( started on 7/7). switched altace to ARB due to cough. Check lipid panel. -cardiology / heart failure consult as needed.   Active problem Atrial fibrillation  stopped taking xaelto 3 months back given concern for rectal bleed h&H stable.  CHADS2vasc of 3. Resume xarelto and monitor.started on BB by cardiology yesterday  Uncontrolled HTN/ hypertensive urgency  Due to dietary ad medication on adherence.start BB and ARB. Prn hydralazine.   Diabetes mellitus type 2  Hold metformin and glipiide. Check A1C, monitor fsg. continue SSI  chronic low back pain  on percocet at home. Resume.  Morbid obesity Needs Counseling on diet and exercise.may need bariatric surgery referral   Diet:cardiac/ diabetic  DVT prophylaxis: on xarelto   Code Status: Full code Family Communication: discussed with wife at bedside Disposition Plan: admit to telemetry  Andon Villard, [facility] Pager [phone]  Total time spent on admission :70 minutes  If 7PM-7AM, please contact night-coverage [email or web address] Password [email or web address] 01/31/2015, 1:59 AM

## 2015-01-31 NOTE — ED Notes (Signed)
Attempted to call report

## 2015-01-31 NOTE — ED Notes (Signed)
This nurse got pt out of bed to weigh.  Pt became extremely tachypnic (RR >35) and tachycardic (HR>130) taking 3 steps from bed to scale.  O2 sats 90%.

## 2015-01-31 NOTE — Progress Notes (Signed)
[facility] PROGRESS NOTE  Maurice Nguyen IHK:[id number] DOB: 08-May-1966 DOA: 01/30/2015 PCP: Burtis Junes, MD  Assessment/Plan: 1. Acute on chronic systolic congestive heart failure -Last transthoracic echocardiogram performed on 09/28/2013 showing ejection fraction of 45-50%, presenting with complaints of soreness of breath associate with weight gain and extremity edema. -He was started on 80 mg of Lasix IV, having good urinary output. He has a net negative fluid balance of 4.5 L, with urinary output of 4.7 since admission. -His weight has come down from 209 on admission to 205.3 -Continue Lasix 80 mg IV daily, monitor his kidney function  2.  History of atrial fibrillation CHADSVasc score of 3  -EKG showed A. Fib, which is now rate controlled -Continue Coreg 6.25 mg by mouth twice a day -Anticoagulated with Xarelto 20 mg by mouth daily  3.  Hypertension -Blood pressures improving, will continue Coreg 6.25 mg by mouth twice a day along with Avapro 75 mg by mouth daily  4.  Type 2 diabetes mellitus -Will restart his home dose of glipizide at 10 mg PO BID -Monitor blood sugars  Code Status: Full Code Family Communication:  Disposition Plan: Continue IV diuresis anticipate discharge home when medically stable    HPI/Subjective: Patient is a pleasant 49 year old with a past medical history morbid obesity, congestive heart failure have an ejection fraction of 45-50%, history of atrial fibrillation was admitted to medicine service on 01/31/2015 presenting with complaints of shortness of breath associate with increasing extremity edema and weight gain. Initial workup included a chest x-ray that showed findings consistent with congestive heart failure. BNP was 628, EKG did not show acute ischemic changes. Patient was started on IV Lasix for acute decompensated congestive heart failure.  Objective: Filed Vitals:   01/31/15 1431  BP: 122/80  Pulse: 84  Temp: 98.3 F (36.8  C)  Resp: 20    Intake/Output Summary (Last 24 hours) at 01/31/15 1451 Last data filed at 01/31/15 1431  Gross per 24 hour  Intake    700 ml  Output   6220 ml  Net  -5520 ml   Filed Weights   01/31/15 0035 01/31/15 0320  Weight: 207.492 kg (457 lb 7 oz) 205.343 kg (452 lb 11.2 oz)    Exam:   General:  No acute distress, he states feeling a little better, although continues to have significant lower extremity swelling  Cardiovascular: Regular rate and rhythm, normal S1 and S2, has 2-3+ bilateral extremity pitting edema  Respiratory: Normal respiratory effort, lungs are clear to auscultation bilaterally  Abdomen: Obese, soft nontender nondistended  Musculoskeletal: 2-3 + bilateral extremity pitting edema  Data Reviewed: Basic Metabolic Panel:  Recent Labs Lab 01/30/15 1916  NA 138  K 4.0  CL 106  CO2 23  GLUCOSE 107*  BUN 12  CREATININE 1.18  CALCIUM 8.5*   Liver Function Tests:  Recent Labs Lab 01/30/15 1916  AST 30  ALT 34  ALKPHOS 55  BILITOT 1.2  PROT 6.7  ALBUMIN 3.3*   No results for input(s): LIPASE, AMYLASE in the last 168 hours. No results for input(s): AMMONIA in the last 168 hours. CBC:  Recent Labs Lab 01/30/15 2358  WBC 7.2  HGB 13.8  HCT 42.1  MCV 95.5  PLT 216   Cardiac Enzymes:  Recent Labs Lab 01/30/15 1916  TROPONINI <0.03   BNP (last 3 results)  Recent Labs  11/07/14 1734 01/30/15 2358  BNP 489.0* 628.7*    ProBNP (last 3 results)  Recent Labs  07/08/14 1714  PROBNP 1232.0*    CBG:  Recent Labs Lab 01/30/15 2055 01/31/15 0326 01/31/15 0642 01/31/15 1102  GLUCAP 89 157* 173* 195*    No results found for this or any previous visit (from the past 240 hour(s)).   Studies: Dg Chest 2 View  01/30/2015   CLINICAL DATA:  Shortness of Breath; lower extremity edema  EXAM: CHEST  2 VIEW  COMPARISON:  November 07, 2014  FINDINGS: There is interstitial and patchy alveolar edema in both lung bases. There is  cardiomegaly with mild pulmonary venous hypertension. There is no adenopathy.  IMPRESSION: Findings consistent with congestive heart failure. Superimposed pneumonia in the bases cannot be excluded radiographically.   Electronically Signed   By: Bretta Bang III M.D.   On: 01/30/2015 19:28    Scheduled Meds: . carvedilol  6.25 mg Oral BID WC  . [START ON 02/01/2015] furosemide  80 mg Intravenous Daily  . insulin aspart  0-15 Units Subcutaneous TID WC  . insulin aspart  0-5 Units Subcutaneous QHS  . irbesartan  75 mg Oral Daily  . potassium chloride SA  20 mEq Oral BID  . rivaroxaban  20 mg Oral Q supper  . sodium chloride  3 mL Intravenous Q12H   Continuous Infusions:   Principal Problem:   Acute combined systolic and diastolic CHF, NYHA class 4, difficult to assess Diastolic function on echo Active Problems:   Obesity, morbid, BMI 50 or higher   Back pain, chronic   Hypertensive urgency   Anticoagulated, on Xarelto, Chads2Vas score 2   Type 2 diabetes mellitus   Atrial fibrillation   Acute on chronic systolic CHF (congestive heart failure)    Time spent:     Maurice Nguyen  [facility] Pager [phone]. If 7PM-7AM, please contact night-coverage at [email or web address], password [facility] 01/31/2015, 2:51 PM  LOS: 0 days

## 2015-01-31 NOTE — Progress Notes (Signed)
  Echocardiogram 2D Echocardiogram has been performed.  Delcie Roch 01/31/2015, 4:27 PM

## 2015-02-01 DIAGNOSIS — E119 Type 2 diabetes mellitus without complications: Secondary | ICD-10-CM

## 2015-02-01 DIAGNOSIS — I482 Chronic atrial fibrillation: Secondary | ICD-10-CM

## 2015-02-01 LAB — BASIC METABOLIC PANEL
Anion gap: 10 (ref 5–15)
BUN: 12 mg/dL (ref 6–20)
CO2: 28 mmol/L (ref 22–32)
Calcium: 8.5 mg/dL — ABNORMAL LOW (ref 8.9–10.3)
Chloride: 103 mmol/L (ref 101–111)
Creatinine, Ser: 1.06 mg/dL (ref 0.61–1.24)
GFR calc Af Amer: >60 mL/min (ref >60)
GFR calc non Af Amer: >60 mL/min (ref >60)
Glucose, Bld: 165 mg/dL — ABNORMAL HIGH (ref 65–99)
Potassium: 3.6 mmol/L (ref 3.5–5.1)
Sodium: 141 mmol/L (ref 135–145)

## 2015-02-01 LAB — GLUCOSE, CAPILLARY
Glucose-Capillary: 154 mg/dL — ABNORMAL HIGH (ref 65–99)
Glucose-Capillary: 156 mg/dL — ABNORMAL HIGH (ref 65–99)
Glucose-Capillary: 140 mg/dL — ABNORMAL HIGH (ref 65–99)
Glucose-Capillary: 136 mg/dL — ABNORMAL HIGH (ref 65–99)

## 2015-02-01 MED ORDER — FUROSEMIDE 10 MG/ML IJ SOLN
40.0000 mg | Freq: Once | INTRAMUSCULAR | Status: AC
  Administered 2015-02-01: 40 mg via INTRAVENOUS
  Filled 2015-02-01: qty 4

## 2015-02-01 NOTE — Progress Notes (Signed)
[facility] PROGRESS NOTE  Maurice Nguyen PYP:[id number] DOB: Jul 26, 1966 DOA: 01/30/2015 PCP: Burtis Junes, MD  Assessment/Plan: 1. Acute on chronic systolic congestive heart failure -Last transthoracic echocardiogram performed on 09/28/2013 showing ejection fraction of 45-50%, presenting with complaints of shortness of breath associated with weight gain and extremity edema. -He was started on 80 mg of Lasix IV, having good urinary output. He has a net negative fluid balance of 7.2 L, with urinary output of 2.3 since yesterday. -His weight has come down from 209 on admission to 202.98  -He remains volume overloaded on exam, will continue IV diuresis  2.  History of atrial fibrillation CHADSVasc score of 3  -EKG showed A. Fib, which is now rate controlled -Continue Coreg 6.25 mg by mouth twice a day -Anticoagulated with Xarelto 20 mg by mouth daily  3.  Hypertension -Blood pressures improving, will continue Coreg 6.25 mg by mouth twice a day along with Avapro 75 mg by mouth daily  4.  Type 2 diabetes mellitus -Blood sugars controlled -Restarted his home dose of glipizide at 10 mg PO BID -Monitor blood sugars  Code Status: Full Code Family Communication:  Disposition Plan: Continue IV diuresis anticipate discharge home when medically stable    HPI/Subjective: Patient is a pleasant 49 year old with a past medical history morbid obesity, congestive heart failure have an ejection fraction of 45-50%, history of atrial fibrillation was admitted to medicine service on 01/31/2015 presenting with complaints of shortness of breath associate with increasing extremity edema and weight gain. Initial workup included a chest x-ray that showed findings consistent with congestive heart failure. BNP was 628, EKG did not show acute ischemic changes. Patient was started on IV Lasix for acute decompensated congestive heart failure.  Objective: Filed Vitals:   02/01/15 1423  BP: 125/78   Pulse: 93  Temp: 98.2 F (36.8 C)  Resp: 20    Intake/Output Summary (Last 24 hours) at 02/01/15 1719 Last data filed at 02/01/15 1549  Gross per 24 hour  Intake   1320 ml  Output   3100 ml  Net  -1780 ml   Filed Weights   01/31/15 0035 01/31/15 0320 02/01/15 0409  Weight: 207.492 kg (457 lb 7 oz) 205.343 kg (452 lb 11.2 oz) 202.985 kg (447 lb 8 oz)    Exam:   General:  No acute distress, he states feeling a little better, although continues to have significant lower extremity swelling  Cardiovascular: Regular rate and rhythm, normal S1 and S2, has 2-3+ bilateral extremity pitting edema  Respiratory: Normal respiratory effort, lungs are clear to auscultation bilaterally  Abdomen: Obese, soft nontender nondistended  Musculoskeletal: 2-3 + bilateral extremity pitting edema  Data Reviewed: Basic Metabolic Panel:  Recent Labs Lab 01/30/15 1916 02/01/15 0307  NA 138 141  K 4.0 3.6  CL 106 103  CO2 23 28  GLUCOSE 107* 165*  BUN 12 12  CREATININE 1.18 1.06  CALCIUM 8.5* 8.5*   Liver Function Tests:  Recent Labs Lab 01/30/15 1916  AST 30  ALT 34  ALKPHOS 55  BILITOT 1.2  PROT 6.7  ALBUMIN 3.3*   No results for input(s): LIPASE, AMYLASE in the last 168 hours. No results for input(s): AMMONIA in the last 168 hours. CBC:  Recent Labs Lab 01/30/15 2358  WBC 7.2  HGB 13.8  HCT 42.1  MCV 95.5  PLT 216   Cardiac Enzymes:  Recent Labs Lab 01/30/15 1916  TROPONINI <0.03   BNP (last 3 results)  Recent Labs  11/07/14 1734 01/30/15 2358  BNP 489.0* 628.7*    ProBNP (last 3 results)  Recent Labs  07/08/14 1714  PROBNP 1232.0*    CBG:  Recent Labs Lab 01/31/15 1638 01/31/15 2148 02/01/15 0626 02/01/15 1110 02/01/15 1617  GLUCAP 123* 125* 154* 156* 140*    No results found for this or any previous visit (from the past 240 hour(s)).   Studies: Dg Chest 2 View  01/30/2015   CLINICAL DATA:  Shortness of Breath; lower extremity  edema  EXAM: CHEST  2 VIEW  COMPARISON:  November 07, 2014  FINDINGS: There is interstitial and patchy alveolar edema in both lung bases. There is cardiomegaly with mild pulmonary venous hypertension. There is no adenopathy.  IMPRESSION: Findings consistent with congestive heart failure. Superimposed pneumonia in the bases cannot be excluded radiographically.   Electronically Signed   By: Bretta Bang III M.D.   On: 01/30/2015 19:28    Scheduled Meds: . carvedilol  6.25 mg Oral BID WC  . furosemide  40 mg Intravenous Once  . furosemide  80 mg Intravenous Daily  . glipiZIDE  10 mg Oral BID AC  . insulin aspart  0-15 Units Subcutaneous TID WC  . insulin aspart  0-5 Units Subcutaneous QHS  . irbesartan  75 mg Oral Daily  . potassium chloride SA  20 mEq Oral BID  . rivaroxaban  20 mg Oral Q supper  . sodium chloride  3 mL Intravenous Q12H   Continuous Infusions:   Principal Problem:   Acute combined systolic and diastolic CHF, NYHA class 4, difficult to assess Diastolic function on echo Active Problems:   Obesity, morbid, BMI 50 or higher   Back pain, chronic   Hypertensive urgency   Anticoagulated, on Xarelto, Chads2Vas score 2   Type 2 diabetes mellitus   Atrial fibrillation   Acute on chronic systolic CHF (congestive heart failure)    Time spent: 35 min    [name]  [facility] Pager [phone]. If 7PM-7AM, please contact night-coverage at [email or web address], password [facility] 02/01/2015, 5:19 PM  LOS: 1 day

## 2015-02-02 LAB — GLUCOSE, CAPILLARY
Comment 1: 1
Glucose-Capillary: 135 mg/dL — ABNORMAL HIGH (ref 65–99)
Glucose-Capillary: 195 mg/dL — ABNORMAL HIGH (ref 65–99)
Glucose-Capillary: 128 mg/dL — ABNORMAL HIGH (ref 65–99)
Glucose-Capillary: 106 mg/dL — ABNORMAL HIGH (ref 65–99)

## 2015-02-02 LAB — BASIC METABOLIC PANEL
Anion gap: 9 (ref 5–15)
BUN: 14 mg/dL (ref 6–20)
CO2: 28 mmol/L (ref 22–32)
Calcium: 8.6 mg/dL — ABNORMAL LOW (ref 8.9–10.3)
Chloride: 102 mmol/L (ref 101–111)
Creatinine, Ser: 1.23 mg/dL (ref 0.61–1.24)
GFR calc Af Amer: >60 mL/min (ref >60)
GFR calc non Af Amer: >60 mL/min (ref >60)
Glucose, Bld: 154 mg/dL — ABNORMAL HIGH (ref 65–99)
Potassium: 4.3 mmol/L (ref 3.5–5.1)
Sodium: 139 mmol/L (ref 135–145)

## 2015-02-02 LAB — CBC
HCT: 42.4 % (ref 39.0–52.0)
Hemoglobin: 13.9 g/dL (ref 13.0–17.0)
MCH: 31.2 pg (ref 26.0–34.0)
MCHC: 32.8 g/dL (ref 30.0–36.0)
MCV: 95.1 fL (ref 78.0–100.0)
Platelets: 196 10*3/uL (ref 150–400)
RBC: 4.46 MIL/uL (ref 4.22–5.81)
RDW: 15 % (ref 11.5–15.5)
WBC: 6.4 10*3/uL (ref 4.0–10.5)

## 2015-02-02 MED ORDER — FUROSEMIDE 40 MG PO TABS
60.0000 mg | ORAL_TABLET | Freq: Every day | ORAL | Status: DC
  Administered 2015-02-02: 60 mg via ORAL
  Filled 2015-02-02 (×2): qty 1

## 2015-02-02 MED ORDER — FUROSEMIDE 10 MG/ML IJ SOLN
80.0000 mg | Freq: Once | INTRAMUSCULAR | Status: AC
  Administered 2015-02-02: 80 mg via INTRAVENOUS

## 2015-02-02 NOTE — Progress Notes (Signed)
[facility] PROGRESS NOTE  ARSENIY LOSACCO ZOX:[id number] DOB: 06/17/1966 DOA: 01/30/2015 PCP: Burtis Junes, MD  Assessment/Plan: 1. Acute on chronic systolic congestive heart failure -Last transthoracic echocardiogram performed on 09/28/2013 showing ejection fraction of 45-50%, presenting with complaints of shortness of breath associated with weight gain and extremity edema. -He was started on 80 mg of Lasix IV, having good urinary output. -Patient having a net negative fluid balance of 9.0 L, having a urinary output of 4.6 L in all past 24 hours -His weight has come down from 209 on admission to 200 on today's weight. -He remains volume overloaded on exam, will continue IV diuresis. Will give him 80 mg of IV Lasix today  2.  History of atrial fibrillation CHADSVasc score of 3  -EKG showed A. Fib, which is now rate controlled -Continue Coreg 6.25 mg by mouth twice a day -Anticoagulated with Xarelto 20 mg by mouth daily  3.  Hypertension -Blood pressures improving, will continue Coreg 6.25 mg by mouth twice a day along with Avapro 75 mg by mouth daily  4.  Type 2 diabetes mellitus -Blood sugars controlled -Restarted his home dose of glipizide at 10 mg PO BID -Monitor blood sugars  Code Status: Full Code Family Communication:  Disposition Plan: Continue IV diuresis anticipate discharge home when medically stable    HPI/Subjective: Patient is a pleasant 49 year old with a past medical history morbid obesity, congestive heart failure have an ejection fraction of 45-50%, history of atrial fibrillation was admitted to medicine service on 01/31/2015 presenting with complaints of shortness of breath associate with increasing extremity edema and weight gain. Initial workup included a chest x-ray that showed findings consistent with congestive heart failure. BNP was 628, EKG did not show acute ischemic changes. Patient was started on IV Lasix for acute decompensated congestive  heart failure.  Objective: Filed Vitals:   02/02/15 1438  BP: 116/70  Pulse: 93  Temp: 98.3 F (36.8 C)  Resp: 20    Intake/Output Summary (Last 24 hours) at 02/02/15 1550 Last data filed at 02/02/15 1436  Gross per 24 hour  Intake    960 ml  Output   3350 ml  Net  -2390 ml   Filed Weights   01/31/15 0320 02/01/15 0409 02/02/15 0607  Weight: 205.343 kg (452 lb 11.2 oz) 202.985 kg (447 lb 8 oz) 200.218 kg (441 lb 6.4 oz)    Exam:   General:  No acute distress, he states feeling a little better, although continues to have significant lower extremity swelling  Cardiovascular: Regular rate and rhythm, normal S1 and S2, has 2-3+ bilateral extremity pitting edema  Respiratory: Normal respiratory effort, lungs are clear to auscultation bilaterally  Abdomen: Obese, soft nontender nondistended  Musculoskeletal: 2-3 + bilateral extremity pitting edema  Data Reviewed: Basic Metabolic Panel:  Recent Labs Lab 01/30/15 1916 02/01/15 0307 02/02/15 0320  NA 138 141 139  K 4.0 3.6 4.3  CL 106 103 102  CO2 23 28 28   GLUCOSE 107* 165* 154*  BUN 12 12 14   CREATININE 1.18 1.06 1.23  CALCIUM 8.5* 8.5* 8.6*   Liver Function Tests:  Recent Labs Lab 01/30/15 1916  AST 30  ALT 34  ALKPHOS 55  BILITOT 1.2  PROT 6.7  ALBUMIN 3.3*   No results for input(s): LIPASE, AMYLASE in the last 168 hours. No results for input(s): AMMONIA in the last 168 hours. CBC:  Recent Labs Lab 01/30/15 2358 02/02/15 0320  WBC 7.2 6.4  HGB 13.8 13.9  HCT 42.1 42.4  MCV 95.5 95.1  PLT 216 196   Cardiac Enzymes:  Recent Labs Lab 01/30/15 1916  TROPONINI <0.03   BNP (last 3 results)  Recent Labs  11/07/14 1734 01/30/15 2358  BNP 489.0* 628.7*    ProBNP (last 3 results)  Recent Labs  07/08/14 1714  PROBNP 1232.0*    CBG:  Recent Labs Lab 02/01/15 1110 02/01/15 1617 02/01/15 2104 02/02/15 0602 02/02/15 1113  GLUCAP 156* 140* 136* 135* 195*    No results  found for this or any previous visit (from the past 240 hour(s)).   Studies: No results found.  Scheduled Meds: . carvedilol  6.25 mg Oral BID WC  . furosemide  80 mg Intravenous Once  . furosemide  60 mg Oral Daily  . glipiZIDE  10 mg Oral BID AC  . insulin aspart  0-15 Units Subcutaneous TID WC  . insulin aspart  0-5 Units Subcutaneous QHS  . irbesartan  75 mg Oral Daily  . potassium chloride SA  20 mEq Oral BID  . rivaroxaban  20 mg Oral Q supper  . sodium chloride  3 mL Intravenous Q12H   Continuous Infusions:   Principal Problem:   Acute combined systolic and diastolic CHF, NYHA class 4, difficult to assess Diastolic function on echo Active Problems:   Obesity, morbid, BMI 50 or higher   Back pain, chronic   Hypertensive urgency   Anticoagulated, on Xarelto, Chads2Vas score 2   Type 2 diabetes mellitus   Atrial fibrillation   Acute on chronic systolic CHF (congestive heart failure)    Time spent: 35 min    [name]  [facility] Pager [phone]. If 7PM-7AM, please contact night-coverage at [email or web address], password [facility] 02/02/2015, 3:50 PM  LOS: 2 days

## 2015-02-03 DIAGNOSIS — Z7901 Long term (current) use of anticoagulants: Secondary | ICD-10-CM

## 2015-02-03 LAB — GLUCOSE, CAPILLARY
Glucose-Capillary: 157 mg/dL — ABNORMAL HIGH (ref 65–99)
Glucose-Capillary: 147 mg/dL — ABNORMAL HIGH (ref 65–99)

## 2015-02-03 LAB — BASIC METABOLIC PANEL
Anion gap: 8 (ref 5–15)
BUN: 15 mg/dL (ref 6–20)
CO2: 29 mmol/L (ref 22–32)
Calcium: 8.8 mg/dL — ABNORMAL LOW (ref 8.9–10.3)
Chloride: 103 mmol/L (ref 101–111)
Creatinine, Ser: 1.11 mg/dL (ref 0.61–1.24)
GFR calc Af Amer: >60 mL/min (ref >60)
GFR calc non Af Amer: >60 mL/min (ref >60)
Glucose, Bld: 174 mg/dL — ABNORMAL HIGH (ref 65–99)
Potassium: 3.8 mmol/L (ref 3.5–5.1)
Sodium: 140 mmol/L (ref 135–145)

## 2015-02-03 MED ORDER — METFORMIN HCL 1000 MG PO TABS: 1000.0000 mg | ORAL_TABLET | Freq: Two times a day (BID) | ORAL | Status: DC

## 2015-02-03 MED ORDER — GLIPIZIDE 10 MG PO TABS: 10.0000 mg | ORAL_TABLET | Freq: Two times a day (BID) | ORAL | Status: DC

## 2015-02-03 MED ORDER — CARVEDILOL 6.25 MG PO TABS
6.2500 mg | ORAL_TABLET | Freq: Two times a day (BID) | ORAL | Status: DC
Start: 2015-02-03 — End: 2019-10-08

## 2015-02-03 MED ORDER — FUROSEMIDE 10 MG/ML IJ SOLN
80.0000 mg | Freq: Once | INTRAMUSCULAR | Status: AC
  Administered 2015-02-03: 80 mg via INTRAVENOUS
  Filled 2015-02-03: qty 8

## 2015-02-03 MED ORDER — VALSARTAN 80 MG PO TABS: 80.0000 mg | ORAL_TABLET | Freq: Every day | ORAL | Status: DC

## 2015-02-03 MED ORDER — RIVAROXABAN 20 MG PO TABS: 20.0000 mg | ORAL_TABLET | Freq: Every day | ORAL | Status: DC

## 2015-02-03 MED ORDER — TRAMADOL HCL 50 MG PO TABS: 50.0000 mg | ORAL_TABLET | Freq: Four times a day (QID) | ORAL | Status: DC | PRN

## 2015-02-03 MED ORDER — FUROSEMIDE 20 MG PO TABS: 60.0000 mg | ORAL_TABLET | Freq: Two times a day (BID) | ORAL | Status: DC

## 2015-02-03 MED ORDER — NITROGLYCERIN 0.4 MG SL SUBL: 0.4000 mg | SUBLINGUAL_TABLET | SUBLINGUAL | Status: AC | PRN

## 2015-02-03 MED ORDER — POTASSIUM CHLORIDE CRYS ER 20 MEQ PO TBCR: 20.0000 meq | EXTENDED_RELEASE_TABLET | Freq: Two times a day (BID) | ORAL | Status: DC

## 2015-02-03 NOTE — Progress Notes (Signed)
Heart Failure Navigator Consult Note  Presentation: Maurice Nguyen is a 49 year old male with history of morbid obesity, CHF with EF of 45-50% as per echo from 09/2013, A. fib on Xarelto (stopped taking it 3 months back due to 3 episodes of rectal bleed and cough), chronic low back pain due to herniated lumbar disc, hypertension who was seen by his cardiologist on 7/7 for a follow-up and complained of progressive shortness of breath on minimal exertion with orthopnea and PND for the past 3 days. Patient reports taking his Lasix as prescribed. He noticed increased bilateral leg swellings. His cardiologist ( Dr Elease Hashimoto) switched diltiazem to Coreg and Altace to valsartan. Patient denies headache, dizziness, fever, chills, nausea , vomiting, chest pain, palpitations, abdominal pain, bowel or urinary symptoms. Reports that he has gained several pounds. As per wife at bedside his baseline weight is on 420 pounds and he is about 457 pounds today. At baseline he is able to ambulate a few blocks without difficulty. Reports that he has not taken his other blood pressure medications besides Lasix and he is not compliant with his diet as well.   Past Medical History  Diagnosis Date  . Diabetes mellitus   . Morbid obesity   . Lumbar herniated disc   . 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 Obesity, morbid, BMI 50 or higher 07/13/2012  . Hypertension 07/13/2012  . Diabetes mellitus 07/13/2012  . Back pain, chronic 07/13/2012  . Complication of anesthesia     " THEY LOST ME AND BROUGHT ME BACK "  . CHF (congestive heart failure)   . Irregular heart beat   . Shortness of breath   . Anticoagulated, on Xarelto, Chads2Vas score 2 10/02/2013    History   Social History  . Marital Status: Single    Spouse Name: N/A  . Number of Children: N/A  . Years of Education: N/A   Social History Main Topics  . Smoking status: Never Smoker   . Smokeless tobacco: Never Used  . Alcohol Use: No     Comment: rarely   . Drug Use: No  . Sexual Activity: Yes   Other Topics Concern  . None   Social History Narrative    ECHO:Study Conclusions-01/31/15  - Left ventricle: Wall thickness was increased in a pattern of moderate LVH. Systolic function was moderately to severely reduced. The estimated ejection fraction was in the range of 30% to 35%. - Left atrium: The atrium was moderately dilated. - Right ventricle: The cavity size was mildly dilated. - Right atrium: The atrium was moderately dilated.  Transthoracic echocardiography. M-mode, complete 2D, spectral Doppler, and color Doppler. Birthdate: Patient birthdate: 1965-10-25. Age: Patient is 49 yr old. Sex: Gender: male. BMI: 58 kg/m^2. Blood pressure:   122/80 Patient status: Inpatient. Study date: Study date: 01/31/2015. Study time: 03:51 PM. Location: Bedside.  BNP    Component Value Date/Time   BNP 628.7* 01/30/2015 2358    ProBNP    Component Value Date/Time   PROBNP 1232.0* 07/08/2014 1714     Education Assessment and Provision:  Detailed education and instructions provided on heart failure disease management including the following:  Signs and symptoms of Heart Failure When to call the physician Importance of daily weights Low sodium diet Fluid restriction Medication management Anticipated future follow-up appointments  Patient education given on each of the above topics.  Patient acknowledges understanding and acceptance of all instructions.  I spoke at length with Mr. Flett and his wife.  They asked many pertinent  questions mostly regarding food and preparation of food.  He has a scale and they are able to teach back when to call the physician related to weights.  He tries to avoid salt and sodium--however did admit that he attended a recent barbeque and ate the "wrong things".   I have reviewed a low sodium diet and high sodium foods to avoid.  He says that he gets and takes his Lasix--yet that the  "Lasix does not work as well any more".  I will attempt to make follow-up for him in the [facility] after discharge.  He has requested TED Hose or leg wraps for discharge and I have collaborated with Dr. Vanessa Barbara regarding that.  Education Materials:  "Living Better With Heart Failure" Booklet, Daily Weight Tracker Tool    High Risk Criteria for Readmission and/or Poor Patient Outcomes:  (Recommend Follow-up with [facility])--yes    EF <30%- Yes 30-35%  2 or more admissions in 6 months- yes  Difficult social situation- No  Demonstrates medication noncompliance- No    Barriers of Care:  Knowledge and compliance  Discharge Planning:  Patient will discharge to home with wife.

## 2015-02-03 NOTE — Care Management (Signed)
Important Message  Patient Details  Name: MOUHAMADOU PROFFER MRN: [id number] Date of Birth: 04-15-1966   Medicare Important Message Given:  Yes-second notification given    Kyla Balzarine 02/03/2015, 2:39 PM

## 2015-02-03 NOTE — Discharge Summary (Signed)
Physician Discharge Summary  Maurice Nguyen FHL:[id number] DOB: May 13, 1966 DOA: 01/30/2015  PCP: Burtis Junes, MD  Admit date: 01/30/2015 Discharge date: 02/03/2015  Time spent: 35 minutes  Recommendations for Outpatient Follow-up:  1. Please follow up on volume status, patient was treated for acute CHF having a weight of 199 kg on day of discharge (22 lb drop in wt with IV diuresis) His Lasix was increased to 60 mg PO BID from 60 mg daily previously on discharge.  2. Will need a BMP drawn in 7 days, HH services set up for RN for disease management, BMP may be obtained by [facility] RN 3. Patient set up with CHF clinic  Discharge Diagnoses:  Principal Problem:   Acute combined systolic and diastolic CHF, NYHA class 4, difficult to assess Diastolic function on echo Active Problems:   Obesity, morbid, BMI 50 or higher   Back pain, chronic   Hypertensive urgency   Anticoagulated, on Xarelto, Chads2Vas score 2   Type 2 diabetes mellitus   Atrial fibrillation   Acute on chronic systolic CHF (congestive heart failure)   Discharge Condition: Stable/Imprved  Diet recommendation: Heart Healthy  Filed Weights   02/01/15 0409 02/02/15 0607 02/03/15 0340  Weight: 202.985 kg (447 lb 8 oz) 200.218 kg (441 lb 6.4 oz) 199.084 kg (438 lb 14.4 oz)    History of present illness:  49 year old male with history of morbid obesity, CHF with EF of 45-50% as per echo from 09/2013, A. fib on Xarelto (stopped taking it 3 months back due to 3 episodes of rectal bleed and cough), chronic low back pain due to herniated lumbar disc, hypertension who was seen by his cardiologist on 7/7 for a follow-up and complained of progressive shortness of breath on minimal exertion with orthopnea and PND for the past 3 days. Patient reports taking his Lasix as prescribed. He noticed increased bilateral leg swellings. His cardiologist ( Dr Elease Hashimoto) switched diltiazem to Coreg and Altace to valsartan. Patient denies headache,  dizziness, fever, chills, nausea , vomiting, chest pain, palpitations, abdominal pain, bowel or urinary symptoms. Reports that he has gained several pounds. As per wife at bedside his baseline weight is on 420 pounds and he is about 457 pounds today. At baseline he is able to ambulate a few blocks without difficulty. Reports that he has not taken his other blood pressure medications besides Lasix and he is not compliant with his diet as well.  Hospital Course:  Patient is a pleasant 49 year old with a past medical history morbid obesity, congestive heart failure have an ejection fraction of 45-50%, history of atrial fibrillation was admitted to medicine service on 01/31/2015 presenting with complaints of shortness of breath associate with increasing extremity edema and weight gain. Initial workup included a chest x-ray that showed findings consistent with congestive heart failure. BNP was 628, EKG did not show acute ischemic changes. Patient was started on IV Lasix for acute decompensated congestive heart failure.   Acute on chronic systolic congestive heart failure -Last transthoracic echocardiogram performed on 09/28/2013 showing ejection fraction of 45-50%, presenting with complaints of shortness of breath associated with weight gain and extremity edema. -He was started on 80 mg of Lasix IV, having good urinary output. -Patient having a net negative fluid balance of 12.0 L on day of discharge, with his weight coming down from 209 kg on admission to 199 on day of discharge. (22 lb weight loss)  -He was discharged on Lasix 60 mg PO BID -He was set up with  HH services for RN and given a follow up appointment at the heart failure clinic  2. History of atrial fibrillation CHADSVasc score of 3  -EKG showed A. Fib, which is now rate controlled -Continue Coreg 6.25 mg by mouth twice a day -Anticoagulated with Xarelto 20 mg by mouth daily  3. Hypertension -Discontinued on Coreg 6.25 mg by mouth  twice a day along with Diovan 80mg  by mouth daily   Discharge Exam: Filed Vitals:   02/03/15 0608  BP: 119/88  Pulse: 85  Temp: 97.4 F (36.3 C)  Resp: 20     General: No acute distress, he states feeling a little better, although continues to have significant lower extremity swelling  Cardiovascular: Regular rate and rhythm, normal S1 and S2, has 2-3+ bilateral extremity pitting edema  Respiratory: Normal respiratory effort, lungs are clear to auscultation bilaterally  Abdomen: Obese, soft nontender nondistended  Musculoskeletal: 2 + bilateral extremity pitting edema  Discharge Instructions   Discharge Instructions    (HEART FAILURE PATIENTS) Call MD:  Anytime you have any of the following symptoms: 1) 3 pound weight gain in 24 hours or 5 pounds in 1 week 2) shortness of breath, with or without a dry hacking cough 3) swelling in the hands, feet or stomach 4) if you have to sleep on extra pillows at night in order to breathe.    Complete by:  As directed      Call MD for:  difficulty breathing, headache or visual disturbances    Complete by:  As directed      Call MD for:  extreme fatigue    Complete by:  As directed      Call MD for:  hives    Complete by:  As directed      Call MD for:  persistant dizziness or light-headedness    Complete by:  As directed      Call MD for:  persistant nausea and vomiting    Complete by:  As directed      Call MD for:  redness, tenderness, or signs of infection (pain, swelling, redness, odor or green/yellow discharge around incision site)    Complete by:  As directed      Call MD for:  severe uncontrolled pain    Complete by:  As directed      Call MD for:  temperature >100.4    Complete by:  As directed      Call MD for:    Complete by:  As directed      Diet - low sodium heart healthy    Complete by:  As directed      Increase activity slowly    Complete by:  As directed           Current Discharge Medication List    START  taking these medications   Details  traMADol (ULTRAM) 50 MG tablet Take 1 tablet (50 mg total) by mouth every 6 (six) hours as needed. Qty: 20 tablet, Refills: 0      CONTINUE these medications which have CHANGED   Details  carvedilol (COREG) 6.25 MG tablet Take 1 tablet (6.25 mg total) by mouth 2 (two) times daily. Qty: 60 tablet, Refills: 11    furosemide (LASIX) 20 MG tablet Take 3 tablets (60 mg total) by mouth 2 (two) times daily. Qty: 90 tablet, Refills: 1    glipiZIDE (GLUCOTROL) 10 MG tablet Take 1 tablet (10 mg total) by mouth 2 (two) times daily before a  meal. Qty: 60 tablet, Refills: 1    metFORMIN (GLUCOPHAGE) 1000 MG tablet Take 1 tablet (1,000 mg total) by mouth 2 (two) times daily with a meal. Qty: 60 tablet, Refills: 1    nitroGLYCERIN (NITROSTAT) 0.4 MG SL tablet Place 1 tablet (0.4 mg total) under the tongue every 5 (five) minutes x 3 doses as needed for chest pain. Qty: 30 tablet, Refills: 12    potassium chloride SA (K-DUR,KLOR-CON) 20 MEQ tablet Take 1 tablet (20 mEq total) by mouth 2 (two) times daily. Qty: 60 tablet, Refills: 3    rivaroxaban (XARELTO) 20 MG TABS tablet Take 1 tablet (20 mg total) by mouth daily with supper. Qty: 30 tablet, Refills: 1    valsartan (DIOVAN) 80 MG tablet Take 1 tablet (80 mg total) by mouth daily. Qty: 31 tablet, Refills: 11      STOP taking these medications     naproxen sodium (ANAPROX) 220 MG tablet      oxyCODONE-acetaminophen (PERCOCET/ROXICET) 5-325 MG per tablet        No Known Allergies Follow-up Information    Follow up with Arvilla Meres, MD. Go on 02/12/2015.   Specialty:  Cardiology   Why:  1:40 pm in the [facility] --gate code 8000--please bring all medicatins to appt.   Contact information:   [address] [place] [address] [phone]        The results of significant diagnostics from this hospitalization (including imaging, microbiology, ancillary  and laboratory) are listed below for reference.    Significant Diagnostic Studies: Dg Chest 2 View  01/30/2015   CLINICAL DATA:  Shortness of Breath; lower extremity edema  EXAM: CHEST  2 VIEW  COMPARISON:  November 07, 2014  FINDINGS: There is interstitial and patchy alveolar edema in both lung bases. There is cardiomegaly with mild pulmonary venous hypertension. There is no adenopathy.  IMPRESSION: Findings consistent with congestive heart failure. Superimposed pneumonia in the bases cannot be excluded radiographically.   Electronically Signed   By: Bretta Bang III M.D.   On: 01/30/2015 19:28    Microbiology: No results found for this or any previous visit (from the past 240 hour(s)).   Labs: Basic Metabolic Panel:  Recent Labs Lab 01/30/15 1916 02/01/15 0307 02/02/15 0320 02/03/15 0328  NA 138 141 139 140  K 4.0 3.6 4.3 3.8  CL 106 103 102 103  CO2 23 28 28 29   GLUCOSE 107* 165* 154* 174*  BUN 12 12 14 15   CREATININE 1.18 1.06 1.23 1.11  CALCIUM 8.5* 8.5* 8.6* 8.8*   Liver Function Tests:  Recent Labs Lab 01/30/15 1916  AST 30  ALT 34  ALKPHOS 55  BILITOT 1.2  PROT 6.7  ALBUMIN 3.3*   No results for input(s): LIPASE, AMYLASE in the last 168 hours. No results for input(s): AMMONIA in the last 168 hours. CBC:  Recent Labs Lab 01/30/15 2358 02/02/15 0320  WBC 7.2 6.4  HGB 13.8 13.9  HCT 42.1 42.4  MCV 95.5 95.1  PLT 216 196   Cardiac Enzymes:  Recent Labs Lab 01/30/15 1916  TROPONINI <0.03   BNP: BNP (last 3 results)  Recent Labs  11/07/14 1734 01/30/15 2358  BNP 489.0* 628.7*    ProBNP (last 3 results)  Recent Labs  07/08/14 1714  PROBNP 1232.0*    CBG:  Recent Labs Lab 02/02/15 1113 02/02/15 1612 02/02/15 2117 02/03/15 0605 02/03/15 1135  GLUCAP 195* 128* 106* 157* 147*  SignedJeralyn Bennett  [facility] 02/03/2015, 1:13 PM

## 2015-02-03 NOTE — Progress Notes (Signed)
Patient discharged per orders. Patient's wife at the bedside for d/c information. All d/c orders, instructions, medications, prescriptions, follow up appointments, home care, home health information discussed. Time allowed for questions and concerns. Questions and concerns addressed. Patient's legs wrapped by PT per orders with coban.  Patient left the unit via wheelchair with a volunteer.  - Alwyn Ren, RN

## 2015-02-03 NOTE — Care Management Note (Signed)
Case Management Note  Patient Details  Name: Maurice Nguyen MRN: [id number] Date of Birth: 1965/08/21  Subjective/Objective:    Admitted with CHF                Action/Plan: Patient lives at home with spouse, spouse stated, he goes to the [facility] Failure Clinic. Has Medicare and does ot have any problem getting his medication - [facility] on [address]; [facility] choicer offered, patient chose [facility]; Mary with [facility] called for arrangements.  Expected Discharge Date:   02/03/2015               Expected Discharge Plan:  Home w Home Health Services (Lives at home with spouse)  Discharge planning Services  CM Consult     Choice offered to:  Patient  HH Arranged:  Disease Management HH Agency:  Other - See comment  Status of Service:  In process, will continue to follow   Reola Mosher [phone] 02/03/2015, 11:11 AM

## 2015-02-03 NOTE — Progress Notes (Signed)
PT - Lymphedema   Received orders for lymphedema wrapping bilateral LE's.  Discussed with MD concerns over wrapping LE in patient with recent CHF exacerbation and especially with patient discharging immediately afterwards.  MD assured PT that patient could handle the increased fluid.  When I arrived to patient's room, he reported that he had used unna boots in the past and that is what he preferred. Worked with nursing and [facility] agency to ensure that patient will receive unna boots at home.  Proceeded with coban wraps to bilateral LE's so patient could d/c today (patient's preference).  Wrapped bilateral LE's from base of toes to below knee with layered wrap:  Tubular dressing against skin, cotton artiflex 2nd layer and coban wrap as 3rd layer.  Discussed with patient and wife reasons to remove wraps:  Increased swelling toes, toes turning purple (loss of blood flow), pain, and difficulty breathing.  Both verbalized understanding.    02/03/2015 Corlis Hove, PT [phone]

## 2015-02-05 ENCOUNTER — Telehealth (HOSPITAL_COMMUNITY): Payer: Self-pay | Admitting: Surgery

## 2015-02-05 NOTE — Telephone Encounter (Signed)
Heart Failure Nurse Navigator Post-Discharge Telephone Call  I called to check on Maurice Nguyen since his recent hospitalization.  He tells me things are going "fine".  He has been weighing everyday and weight today was 439 lbs. (weight on discharge date was 438 lbs).  He says he has been avoiding salt and high sodium foods.  He also says that he has all prescribed medications and is not having any issues with them.  I reminded him of his upcoming appt on 7/20 in the [facility] Clinic--he says he plans to be there.  I encouraged him to call me back with any concerns or questions related to his HF.

## 2015-02-06 ENCOUNTER — Telehealth (HOSPITAL_COMMUNITY): Payer: Self-pay

## 2015-02-06 NOTE — Telephone Encounter (Signed)
Eddie with [facility] called to get VO for weekly visits with patient x 6 weeks for medication and disease management and una boots.  VO given per Shirlee Latch.

## 2015-02-12 ENCOUNTER — Inpatient Hospital Stay (HOSPITAL_COMMUNITY): Admit: 2015-02-12 | Payer: Medicare Other

## 2015-04-07 ENCOUNTER — Encounter (HOSPITAL_COMMUNITY): Payer: Self-pay | Admitting: Family Medicine

## 2015-04-07 ENCOUNTER — Emergency Department (HOSPITAL_COMMUNITY): Payer: Medicare Other

## 2015-04-07 ENCOUNTER — Observation Stay (HOSPITAL_COMMUNITY)
Admission: EM | Admit: 2015-04-07 | Discharge: 2015-04-09 | Disposition: A | Discharge status: Home / Self Care | Payer: Medicare Other | Attending: Internal Medicine | Admitting: Internal Medicine

## 2015-04-07 DIAGNOSIS — I480 Paroxysmal atrial fibrillation: Secondary | ICD-10-CM | POA: Diagnosis not present

## 2015-04-07 DIAGNOSIS — L97909 Non-pressure chronic ulcer of unspecified part of unspecified lower leg with unspecified severity: Secondary | ICD-10-CM | POA: Diagnosis present

## 2015-04-07 DIAGNOSIS — I10 Essential (primary) hypertension: Secondary | ICD-10-CM | POA: Diagnosis not present

## 2015-04-07 DIAGNOSIS — E1165 Type 2 diabetes mellitus with hyperglycemia: Secondary | ICD-10-CM | POA: Diagnosis not present

## 2015-04-07 DIAGNOSIS — L03115 Cellulitis of right lower limb: Secondary | ICD-10-CM

## 2015-04-07 DIAGNOSIS — M79672 Pain in left foot: Secondary | ICD-10-CM | POA: Diagnosis present

## 2015-04-07 DIAGNOSIS — E11621 Type 2 diabetes mellitus with foot ulcer: Secondary | ICD-10-CM | POA: Diagnosis not present

## 2015-04-07 DIAGNOSIS — Z7901 Long term (current) use of anticoagulants: Secondary | ICD-10-CM | POA: Insufficient documentation

## 2015-04-07 DIAGNOSIS — E118 Type 2 diabetes mellitus with unspecified complications: Secondary | ICD-10-CM

## 2015-04-07 DIAGNOSIS — Z833 Family history of diabetes mellitus: Secondary | ICD-10-CM | POA: Insufficient documentation

## 2015-04-07 DIAGNOSIS — I5042 Chronic combined systolic (congestive) and diastolic (congestive) heart failure: Secondary | ICD-10-CM | POA: Diagnosis not present

## 2015-04-07 DIAGNOSIS — IMO0002 Reserved for concepts with insufficient information to code with codable children: Secondary | ICD-10-CM | POA: Diagnosis present

## 2015-04-07 HISTORY — DX: Type 2 diabetes mellitus without complications: E11.9

## 2015-04-07 HISTORY — DX: Non-pressure chronic ulcer of unspecified part of unspecified lower leg with unspecified severity: L97.909

## 2015-04-07 LAB — BASIC METABOLIC PANEL
Anion gap: 8 (ref 5–15)
BUN: 12 mg/dL (ref 6–20)
CO2: 27 mmol/L (ref 22–32)
Calcium: 9 mg/dL (ref 8.9–10.3)
Chloride: 102 mmol/L (ref 101–111)
Creatinine, Ser: 1 mg/dL (ref 0.61–1.24)
GFR calc Af Amer: >60 mL/min (ref >60)
GFR calc non Af Amer: >60 mL/min (ref >60)
Glucose, Bld: 209 mg/dL — ABNORMAL HIGH (ref 65–99)
Potassium: 3.8 mmol/L (ref 3.5–5.1)
Sodium: 137 mmol/L (ref 135–145)

## 2015-04-07 LAB — CBC
HCT: 43.4 % (ref 39.0–52.0)
Hemoglobin: 14.4 g/dL (ref 13.0–17.0)
MCH: 32 pg (ref 26.0–34.0)
MCHC: 33.2 g/dL (ref 30.0–36.0)
MCV: 96.4 fL (ref 78.0–100.0)
Platelets: 204 10*3/uL (ref 150–400)
RBC: 4.5 MIL/uL (ref 4.22–5.81)
RDW: 13.9 % (ref 11.5–15.5)
WBC: 6.5 10*3/uL (ref 4.0–10.5)

## 2015-04-07 LAB — BRAIN NATRIURETIC PEPTIDE: B Natriuretic Peptide: 462.5 pg/mL — ABNORMAL HIGH (ref 0.0–100.0)

## 2015-04-07 LAB — I-STAT TROPONIN, ED: Troponin i, poc: 0.05 ng/mL (ref 0.00–0.08)

## 2015-04-07 NOTE — ED Notes (Signed)
Pt ambulatory to room.

## 2015-04-07 NOTE — ED Notes (Signed)
Patient transported to X-ray 

## 2015-04-07 NOTE — ED Notes (Signed)
Pt here for SOB, leg swelling, abd swelling, and knot to the bottom of foot. Pt has sores to BLE.

## 2015-04-07 NOTE — ED Provider Notes (Signed)
CSN: [id number]     Arrival date & time 04/07/15  1728 History   First MD Initiated Contact with Patient 04/07/15 2246     Chief Complaint  Patient presents with  . Shortness of Breath     (Consider location/radiation/quality/duration/timing/severity/associated sxs/prior Treatment) HPI  Maurice Nguyen is a 49 yo male with a PMH of DM, HTN, CHF who presents today with SOB, leg swelling, and a "bump" on his left foot that is very sensitive. The main reason the patient presents to the ED is for evaluation of the bump on his left foot. He admits to increases tenderness to touch. He is a diabetic and this spot is of great concern to the patient. He is also concerned about an ulcer that has developed on his right lateral calf. He noticed this lesion 2 weeks ago.   The patient is also complaining of epigastric pain x 2 days. He states that the pain is contributing to his SOB. Patient states that he has had about 10 pounds of unintentional weight loss. Patient denies N/V. He admits to occasional diarrhea. Patient admits to DOE and has mild SOB with rest. He denies associated CP. Patient denies fever, chills, night sweats, lightheadedness.    Past Medical History  Diagnosis Date  . Diabetes mellitus   . Morbid obesity   . Lumbar herniated disc   . 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 Obesity, morbid, BMI 50 or higher 07/13/2012  . Hypertension 07/13/2012  . Diabetes mellitus 07/13/2012  . Back pain, chronic 07/13/2012  . Complication of anesthesia     " THEY LOST ME AND BROUGHT ME BACK "  . CHF (congestive heart failure)   . Irregular heart beat   . Shortness of breath   . Anticoagulated, on Xarelto, Chads2Vas score 2 10/02/2013   Past Surgical History  Procedure Laterality Date  . Abscess drainage    . Dental surgery    . Colonoscopy  08/22/2012    Procedure: COLONOSCOPY;  Surgeon: Shirley Friar, MD;  Location: [facility] ENDOSCOPY;  Service: Endoscopy;  Laterality: N/A;   History  reviewed. No pertinent family history. Social History  Substance Use Topics  . Smoking status: Never Smoker   . Smokeless tobacco: Never Used  . Alcohol Use: No     Comment: rarely    Review of Systems  All other systems negative except as documented in the HPI. All pertinent positives and negatives as reviewed in the HPI.  Allergies  Review of patient's allergies indicates no known allergies.  Home Medications   Prior to Admission medications   Medication Sig Start Date End Date Taking? Authorizing Provider  carvedilol (COREG) 6.25 MG tablet Take 1 tablet (6.25 mg total) by mouth 2 (two) times daily. 02/03/15  Yes [name], MD  furosemide (LASIX) 20 MG tablet Take 3 tablets (60 mg total) by mouth 2 (two) times daily. Patient taking differently: Take 60 mg by mouth daily. Takes  once daily, can take 3 more tabs as needed. 02/03/15  Yes [name], MD  glipiZIDE (GLUCOTROL) 10 MG tablet Take 1 tablet (10 mg total) by mouth 2 (two) times daily before a meal. 02/03/15  Yes [name], MD  metFORMIN (GLUCOPHAGE) 1000 MG tablet Take 1 tablet (1,000 mg total) by mouth 2 (two) times daily with a meal. 02/03/15  Yes [name], MD  nitroGLYCERIN (NITROSTAT) 0.4 MG SL tablet Place 1 tablet (0.4 mg total) under the tongue every 5 (five) minutes x 3 doses as needed for  chest pain. 02/03/15  Yes [name], MD  potassium chloride SA (K-DUR,KLOR-CON) 20 MEQ tablet Take 1 tablet (20 mEq total) by mouth 2 (two) times daily. 02/03/15  Yes [name], MD  rivaroxaban (XARELTO) 20 MG TABS tablet Take 1 tablet (20 mg total) by mouth daily with supper. 02/03/15  Yes [name], MD  valsartan (DIOVAN) 80 MG tablet Take 1 tablet (80 mg total) by mouth daily. 02/03/15  Yes [name], MD  traMADol (ULTRAM) 50 MG tablet Take 1 tablet (50 mg total) by mouth every 6 (six) hours as needed. 02/03/15   [name], MD   BP 170/108 mmHg  Pulse 97  Temp(Src) 98.2 F (36.8  C) (Oral)  Resp 21  Wt 435 lb 7 oz (197.513 kg)  SpO2 95% Physical Exam  Constitutional: He appears well-developed and well-nourished. No distress.  HENT:  Head: Normocephalic and atraumatic.  Mouth/Throat: Oropharynx is clear and moist.  Neck: Normal range of motion. Neck supple.  Cardiovascular: Normal rate, regular rhythm and normal heart sounds.  Exam reveals no gallop and no friction rub.   No murmur heard. Pulmonary/Chest: Effort normal and breath sounds normal. No respiratory distress.  Musculoskeletal:       Legs: Neurological: He is alert. He exhibits normal muscle tone. Coordination normal.  Skin: Skin is warm and dry. No rash noted. No erythema.    ED Course  Procedures (including critical care time) Labs Review Labs Reviewed  BASIC METABOLIC PANEL - Abnormal; Notable for the following:    Glucose, Bld 209 (*)    All other components within normal limits  BRAIN NATRIURETIC PEPTIDE - Abnormal; Notable for the following:    B Natriuretic Peptide 462.5 (*)    All other components within normal limits  CBC  I-STAT TROPOININ, ED    Imaging Review Dg Chest 2 View  04/07/2015   CLINICAL DATA:  Trouble breathing with lower chest pain 3 days.  EXAM: CHEST  2 VIEW  COMPARISON:  01/30/2015 and 11/07/2014  FINDINGS: Lungs are adequately inflated without focal consolidation or effusion. There is mild prominence of the perihilar markings. Mild stable cardiomegaly. Remainder of the exam is unchanged.  IMPRESSION: Mild stable cardiomegaly with suggestion of minimal vascular congestion.   Electronically Signed   By: Elberta Fortis M.D.   On: 04/07/2015 19:11   I have personally reviewed and evaluated these images and lab results as part of my medical decision-making.  Patient be admitted to the hospital for further evaluation and care of a cellulitis and wound to his lower extremity    Charlestine Night, PA-C 04/11/15 0154  Charlestine Night, PA-C 04/11/15 5400  Tomasita Crumble, MD 04/11/15 [phone]

## 2015-04-08 ENCOUNTER — Encounter (HOSPITAL_COMMUNITY): Payer: Self-pay | Admitting: General Practice

## 2015-04-08 ENCOUNTER — Observation Stay (HOSPITAL_COMMUNITY): Payer: Medicare Other

## 2015-04-08 DIAGNOSIS — Z7901 Long term (current) use of anticoagulants: Secondary | ICD-10-CM

## 2015-04-08 DIAGNOSIS — L97909 Non-pressure chronic ulcer of unspecified part of unspecified lower leg with unspecified severity: Secondary | ICD-10-CM | POA: Diagnosis present

## 2015-04-08 DIAGNOSIS — I5042 Chronic combined systolic (congestive) and diastolic (congestive) heart failure: Secondary | ICD-10-CM | POA: Diagnosis present

## 2015-04-08 DIAGNOSIS — L97911 Non-pressure chronic ulcer of unspecified part of right lower leg limited to breakdown of skin: Secondary | ICD-10-CM

## 2015-04-08 DIAGNOSIS — E11621 Type 2 diabetes mellitus with foot ulcer: Secondary | ICD-10-CM | POA: Diagnosis not present

## 2015-04-08 DIAGNOSIS — E118 Type 2 diabetes mellitus with unspecified complications: Secondary | ICD-10-CM | POA: Diagnosis not present

## 2015-04-08 DIAGNOSIS — L97901 Non-pressure chronic ulcer of unspecified part of unspecified lower leg limited to breakdown of skin: Secondary | ICD-10-CM

## 2015-04-08 LAB — CBG MONITORING, ED: Glucose-Capillary: 194 mg/dL — ABNORMAL HIGH (ref 65–99)

## 2015-04-08 LAB — GLUCOSE, CAPILLARY
Glucose-Capillary: 109 mg/dL — ABNORMAL HIGH (ref 65–99)
Glucose-Capillary: 159 mg/dL — ABNORMAL HIGH (ref 65–99)

## 2015-04-08 LAB — BASIC METABOLIC PANEL
Anion gap: 6 (ref 5–15)
BUN: 13 mg/dL (ref 6–20)
CO2: 29 mmol/L (ref 22–32)
Calcium: 8.6 mg/dL — ABNORMAL LOW (ref 8.9–10.3)
Chloride: 103 mmol/L (ref 101–111)
Creatinine, Ser: 1.09 mg/dL (ref 0.61–1.24)
GFR calc Af Amer: >60 mL/min (ref >60)
GFR calc non Af Amer: >60 mL/min (ref >60)
Glucose, Bld: 233 mg/dL — ABNORMAL HIGH (ref 65–99)
Potassium: 3.9 mmol/L (ref 3.5–5.1)
Sodium: 138 mmol/L (ref 135–145)

## 2015-04-08 MED ORDER — IRBESARTAN 75 MG PO TABS
75.0000 mg | ORAL_TABLET | Freq: Every day | ORAL | Status: DC
  Administered 2015-04-08 – 2015-04-09 (×2): 75 mg via ORAL
  Filled 2015-04-08 (×2): qty 1

## 2015-04-08 MED ORDER — FUROSEMIDE 10 MG/ML IJ SOLN
40.0000 mg | Freq: Two times a day (BID) | INTRAMUSCULAR | Status: DC
  Administered 2015-04-08 – 2015-04-09 (×2): 40 mg via INTRAVENOUS
  Filled 2015-04-08 (×2): qty 4

## 2015-04-08 MED ORDER — INSULIN ASPART 100 UNIT/ML ~~LOC~~ SOLN
0.0000 [IU] | Freq: Three times a day (TID) | SUBCUTANEOUS | Status: DC
  Administered 2015-04-09: 2 [IU] via SUBCUTANEOUS
  Administered 2015-04-09: 3 [IU] via SUBCUTANEOUS

## 2015-04-08 MED ORDER — VANCOMYCIN HCL IN DEXTROSE 1-5 GM/200ML-% IV SOLN
1000.0000 mg | Freq: Once | INTRAVENOUS | Status: AC
  Administered 2015-04-08: 1000 mg via INTRAVENOUS
  Filled 2015-04-08: qty 200

## 2015-04-08 MED ORDER — METFORMIN HCL 500 MG PO TABS
1000.0000 mg | ORAL_TABLET | Freq: Two times a day (BID) | ORAL | Status: DC
  Administered 2015-04-08: 1000 mg via ORAL
  Filled 2015-04-08: qty 2

## 2015-04-08 MED ORDER — POTASSIUM CHLORIDE CRYS ER 20 MEQ PO TBCR
20.0000 meq | EXTENDED_RELEASE_TABLET | Freq: Two times a day (BID) | ORAL | Status: DC
  Administered 2015-04-08 – 2015-04-09 (×4): 20 meq via ORAL
  Filled 2015-04-08 (×4): qty 1

## 2015-04-08 MED ORDER — OXYCODONE-ACETAMINOPHEN 5-325 MG PO TABS
1.0000 | ORAL_TABLET | Freq: Four times a day (QID) | ORAL | Status: DC | PRN
  Administered 2015-04-08 – 2015-04-09 (×5): 2 via ORAL
  Filled 2015-04-08 (×5): qty 2

## 2015-04-08 MED ORDER — GLIPIZIDE 10 MG PO TABS
10.0000 mg | ORAL_TABLET | Freq: Two times a day (BID) | ORAL | Status: DC
  Administered 2015-04-08 – 2015-04-09 (×3): 10 mg via ORAL
  Filled 2015-04-08 (×8): qty 1

## 2015-04-08 MED ORDER — FUROSEMIDE 40 MG PO TABS
60.0000 mg | ORAL_TABLET | Freq: Every day | ORAL | Status: DC
  Administered 2015-04-08: 60 mg via ORAL
  Filled 2015-04-08: qty 3

## 2015-04-08 MED ORDER — CARVEDILOL 6.25 MG PO TABS
6.2500 mg | ORAL_TABLET | Freq: Two times a day (BID) | ORAL | Status: DC
  Administered 2015-04-08 – 2015-04-09 (×3): 6.25 mg via ORAL
  Filled 2015-04-08 (×5): qty 1

## 2015-04-08 MED ORDER — MORPHINE SULFATE (PF) 2 MG/ML IV SOLN
1.0000 mg | INTRAVENOUS | Status: DC | PRN
  Filled 2015-04-08 (×2): qty 1

## 2015-04-08 MED ORDER — RIVAROXABAN 20 MG PO TABS
20.0000 mg | ORAL_TABLET | Freq: Every day | ORAL | Status: DC
  Administered 2015-04-08: 20 mg via ORAL
  Filled 2015-04-08 (×2): qty 1

## 2015-04-08 MED ORDER — OXYCODONE HCL 5 MG PO TABS
5.0000 mg | ORAL_TABLET | Freq: Four times a day (QID) | ORAL | Status: DC | PRN
  Administered 2015-04-08 (×2): 5 mg via ORAL
  Filled 2015-04-08 (×2): qty 1

## 2015-04-08 NOTE — Progress Notes (Signed)
PATIENT DETAILS Name: Maurice Nguyen Age: 49 y.o. Sex: male Date of Birth: 12/17/1965 Admit Date: 04/07/2015 Admitting Physician Alberteen Sam, MD ZOX:[id number] Oswaldo Done, MD  Subjective: Worried about ulcer in Right lateral leg. Also complaining of some pain in the plantar aspect of the left foot.  Assessment/Plan: Principal Problem: Chronic ulcer of lower extremity:consistent with venous stasis ulcer. Suspect will require wound care/Unna boots. Will ned home health services to be set up. No evidence of infection.  Active Problems: Lower ext swelling:suspect multifactorial from Chronic venous stasis/Chronic CHF. Start IV Lasix, wrap leg with Unna boots.   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 Supportive care with as needed narcotics  Chronic systolic and diastolic XBJ:YNWGNFAO has worsening leg swelling-suspect this is more from Venous stasis-rather than CHF exac-has lost weight compared to most recent admit.Lasix for now  Diabetes mellitus type 2 with complications, uncontrolled:CBG's stable-continue Glipizide/SSI. Hold Metformin while inpatient.   Essential hypertension:controlled-continue with Avapro and Coreg  PAF:on Coreg and Xarelto. Stable  Disposition: Remain inpatient-home 9/14  Antimicrobial agents  See below  Anti-infectives    Start     Dose/Rate Route Frequency Ordered Stop   04/08/15 0100  vancomycin (VANCOCIN) IVPB 1000 mg/200 mL premix     1,000 mg 200 mL/hr over 60 Minutes Intravenous  Once 04/08/15 0048 04/08/15 0236      DVT Prophylaxis: Xarelto  Code Status: Full code   Family Communication Spouse at bedside  Procedures: None at bedside  CONSULTS:  None  Time spent 35 minutes-Greater than 50% of this time was spent in counseling, explanation of diagnosis, planning of further management, and coordination of care.  MEDICATIONS: Scheduled  Meds: . carvedilol  6.25 mg Oral BID WC  . furosemide  40 mg Intravenous BID  . glipiZIDE  10 mg Oral BID AC  . insulin aspart  0-15 Units Subcutaneous TID WC  . irbesartan  75 mg Oral Daily  . potassium chloride SA  20 mEq Oral BID  . rivaroxaban  20 mg Oral Q supper   Continuous Infusions:  PRN Meds:.morphine injection, oxyCODONE-acetaminophen    PHYSICAL EXAM: Vital signs in last 24 hours: Filed Vitals:   04/08/15 1000 04/08/15 1155 04/08/15 1352 04/08/15 1456  BP:  157/103 148/100 143/97  Pulse: 105 112 86 100  Temp:  98.3 F (36.8 C)  97.9 F (36.6 C)  TempSrc:  Oral  Oral  Resp:  Weight:    197.315 kg (435 lb)  SpO2: 92% 99% 96% 98%    Weight change:  Filed Weights   04/07/15 1819 04/08/15 1456  Weight: 197.513 kg (435 lb 7 oz) 197.315 kg (435 lb)   Body mass index is 55.83 kg/(m^2).   Gen Exam: Awake and alert with clear speech.   Neck: Supple, No JVD.   Chest: B/L Clear.   CVS: S1 S2 Regular, no murmurs.  Abdomen: soft, BS +, non tender, non distended.  Extremities: ++ edema, lower extremities warm to touch. Neurologic: Non Focal.   Skin: No Rash.   Wounds: N/A.    Intake/Output from previous day:  Intake/Output Summary (Last 24 hours) at 04/08/15 1709 Last data filed at 04/08/15 1426  Gross per 24 hour  Intake      0 ml  Output   1350 ml  Net  -1350 ml     LAB RESULTS:  CBC  Recent Labs Lab 04/07/15 1850  WBC 6.5  HGB 14.4  HCT 43.4  PLT 204  MCV 96.4  MCH 32.0  MCHC 33.2  RDW 13.9    Chemistries   Recent Labs Lab 04/07/15 1850 04/08/15 0559  NA 137 138  K 3.8 3.9  CL 102 103  CO2 27 29  GLUCOSE 209* 233*  BUN 12 13  CREATININE 1.00 1.09  CALCIUM 9.0 8.6*    CBG:  Recent Labs Lab 04/08/15 0752 04/08/15 1643  GLUCAP 194* 109*    GFR Estimated Creatinine Clearance: 145.4 mL/min (by C-G formula based on Cr of 1.09).  Coagulation profile No results for input(s): INR, PROTIME in the last 168  hours.  Cardiac Enzymes No results for input(s): CKMB, TROPONINI, MYOGLOBIN in the last 168 hours.  Invalid input(s): CK  Invalid input(s): POCBNP No results for input(s): DDIMER in the last 72 hours. No results for input(s): HGBA1C in the last 72 hours. No results for input(s): CHOL, HDL, LDLCALC, TRIG, CHOLHDL, LDLDIRECT in the last 72 hours. No results for input(s): TSH, T4TOTAL, T3FREE, THYROIDAB in the last 72 hours.  Invalid input(s): FREET3 No results for input(s): VITAMINB12, FOLATE, FERRITIN, TIBC, IRON, RETICCTPCT in the last 72 hours. No results for input(s): LIPASE, AMYLASE in the last 72 hours.  Urine Studies No results for input(s): UHGB, CRYS in the last 72 hours.  Invalid input(s): UACOL, UAPR, USPG, UPH, UTP, UGL, UKET, UBIL, UNIT, UROB, ULEU, UEPI, UWBC, URBC, UBAC, CAST, UCOM, BILUA  MICROBIOLOGY: No results found for this or any previous visit (from the past 240 hour(s)).  RADIOLOGY STUDIES/RESULTS: Dg Chest 2 View  04/07/2015   CLINICAL DATA:  Trouble breathing with lower chest pain 3 days.  EXAM: CHEST  2 VIEW  COMPARISON:  01/30/2015 and 11/07/2014  FINDINGS: Lungs are adequately inflated without focal consolidation or effusion. There is mild prominence of the perihilar markings. Mild stable cardiomegaly. Remainder of the exam is unchanged.  IMPRESSION: Mild stable cardiomegaly with suggestion of minimal vascular congestion.   Electronically Signed   By: Elberta Fortis M.D.   On: 04/07/2015 19:11   [place] Misc Soft Tissue  04/08/2015   CLINICAL DATA:  Left foot pain. New plantar nodule in the left foot. Clinical concern for plantar fibromatosis or abscess.  EXAM: ULTRASOUND LEFT LOWER EXTREMITY LIMITED  TECHNIQUE: Ultrasound examination of the lower extremity soft tissues was performed in the area of clinical concern.  COMPARISON:  Radiographs 1 day prior.  FINDINGS: Targeted sonographic evaluation of the area of clinical concern in the plantar foot demonstrates a  hypoechoic ovoid 0.8 x 0.4 x 0.7 cm avascular structure in the subcutaneous tissues in the region of the proximal metatarsals. There is no internal blood flow. Additional smaller hypoechoic structures in the subcutaneous tissues, some of which appear tubular, a few of the smaller structures represent vessels. Right foot is evaluated for comparison demonstrating a similar appearance of hypoechoic structures in the soft tissues.  IMPRESSION: Small sub centimeter hypoechoic nodule in the region of clinical concern in the plantar foot. This does not have the appearance of an abscess. Recommend MRI characterization.   Electronically Signed   By: Rubye Oaks M.D.   On: 04/08/2015 05:30   Dg Foot Complete Left  04/08/2015   CLINICAL DATA:  Left foot pain for 2 days. No known injury. Pain about the fifth proximal metatarsal.  EXAM: LEFT FOOT - COMPLETE 3+ VIEW  COMPARISON:  09/12/2010  FINDINGS: No fracture or  dislocation. The alignment and joint spaces are maintained. There is an os peroneal. The bones are under mineralized. There is mild pes planus. Vascular calcifications are seen. There is a plantar calcaneal spur. Mild dorsal soft tissue edema.  IMPRESSION: No acute bony abnormality.  Mild dorsal soft tissue edema.   Electronically Signed   By: Rubye Oaks M.D.   On: 04/08/2015 00:30    Jeoffrey Massed, MD  [facility] Pager:[phone]  If 7PM-7AM, please contact night-coverage [email or web address] Password [facility] 04/08/2015, 5:09 PM

## 2015-04-08 NOTE — ED Notes (Signed)
Pt placed in hospital bed

## 2015-04-08 NOTE — ED Notes (Signed)
Pt being transported via w/c to 6N21.

## 2015-04-08 NOTE — H&P (Signed)
History and Physical  [name]:[id number] DOB: 1965-10-01 DOA: 04/07/2015  Referring physician: Ebbie Ridge, PA-C PCP: Burtis Junes, MD   Chief Complaint: "The ulcer on my leg is back, and I have a new spot on my foot that I think is infected."  HPI: Maurice Nguyen is a 49 y.o. male with a past medical history significant for type 2 diabetes, Afib on Xarelto, combined systolic and diastolic CHF with last echo EF 35% in 2016, venous insufficiency with non-healing ulcers last admitted for DFI in Apr 2016, hypertension, and morbid obesity who presents with concern for diabetic foot infection.  The patient was in his usual state of health until two weeks ago.  Prior to that, he had had a home health aide come to his house to give him a Radio broadcast assistant for all of August.  Two weeks ago, this service stopped, the [employer] was d/c'd and the patient was given common compression stockings.  Since then, his swelling has started to get worse again, and almost immediately after stopping the Unna boot he noticed skin breakdown on his right outer shin again.  In the last few days, he has also noticed a painful lump under his left ball of the foot, which is painful to walk on and drove him to return to the ED.  In the ED, the patient was given vancomycin for presumed diabetic foot infection and oxycodone for pain.  His weight was stable from his last discharge.   Review of Systems:  Patient seen 0300. Pt complains of right shin burning, leg swelling, left foot sensitivity/pain in the plantar aspect.    Pt denies any chest discomfort, increased dyspnea or cough.  Denies fever, chills, drainage/pus, swelling.  Otherwise, twelve systems were reviewed and were negative except as noted above in the history of present illness.  Past Medical History  Diagnosis Date  . Diabetes mellitus   . Morbid obesity   . Lumbar herniated disc   . 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 Obesity, morbid, BMI  50 or higher 07/13/2012  . Hypertension 07/13/2012  . Diabetes mellitus 07/13/2012  . Back pain, chronic 07/13/2012  . Complication of anesthesia     " THEY LOST ME AND BROUGHT ME BACK "  . CHF (congestive heart failure)   . Irregular heart beat   . Shortness of breath   . Anticoagulated, on Xarelto, Chads2Vas score 2 10/02/2013   Past Surgical History  Procedure Laterality Date  . Abscess drainage    . Dental surgery    . Colonoscopy  08/22/2012    Procedure: COLONOSCOPY;  Surgeon: Shirley Friar, MD;  Location: [facility] ENDOSCOPY;  Service: Endoscopy;  Laterality: N/A;   Social History:  reports that he has never smoked. He has never used smokeless tobacco. He reports that he does not drink alcohol or use illicit drugs. Patient lives at home with his wife.  He is disabled.  He does not smoke.  He gets breathless with any walking.  No Known Allergies  History reviewed. No pertinent family history.  Positive family history of diabetes.   Prior to Admission medications   Medication Sig Start Date End Date Taking? Authorizing Provider  carvedilol (COREG) 6.25 MG tablet Take 1 tablet (6.25 mg total) by mouth 2 (two) times daily. 02/03/15  Yes [name], MD  furosemide (LASIX) 20 MG tablet Take 3 tablets (60 mg total) by mouth 2 (two) times daily. Patient taking differently: Take 60 mg by mouth daily. Takes  60mg  once daily, can take 3 more tabs as needed. 02/03/15  Yes [name], MD  glipiZIDE (GLUCOTROL) 10 MG tablet Take 1 tablet (10 mg total) by mouth 2 (two) times daily before a meal. 02/03/15  Yes [name], MD  metFORMIN (GLUCOPHAGE) 1000 MG tablet Take 1 tablet (1,000 mg total) by mouth 2 (two) times daily with a meal. 02/03/15  Yes [name], MD  nitroGLYCERIN (NITROSTAT) 0.4 MG SL tablet Place 1 tablet (0.4 mg total) under the tongue every 5 (five) minutes x 3 doses as needed for chest pain. 02/03/15  Yes [name], MD  potassium chloride SA  (K-DUR,KLOR-CON) 20 MEQ tablet Take 1 tablet (20 mEq total) by mouth 2 (two) times daily. 02/03/15  Yes [name], MD  rivaroxaban (XARELTO) 20 MG TABS tablet Take 1 tablet (20 mg total) by mouth daily with supper. 02/03/15  Yes [name], MD  valsartan (DIOVAN) 80 MG tablet Take 1 tablet (80 mg total) by mouth daily. 02/03/15  Yes [name], MD  traMADol (ULTRAM) 50 MG tablet Take 1 tablet (50 mg total) by mouth every 6 (six) hours as needed. 02/03/15   [name], MD    Physical Exam: BP 128/96 mmHg  Pulse 97  Temp(Src) 98 F (36.7 C) (Oral)  Resp 16  Wt 197.513 kg (435 lb 7 oz)  SpO2 94% General: Adult male, large, in no acute distress.  Responds appropriately to questions.  Eye contact, dress and hygiene appropriate. HEENT: Corneas clear, conjunctivae and sclerae normal without injection or icterus, lids and lashes normal.  Visual tracking smooth.  OP moist without erythema, exudates, cobblestoning, or ulcers.  No airway deformities.  Neck supple.   Cardiac: RRR, nl S1-S2, no murmurs, rubs, gallops.  Capillary refill is less than 2 seconds.   Respiratory: Normal respiratory rate and rhythm.  CTAB without rales or wheezes. Abdomen: BS present.  No TTP or rebound all quadrants.  No masses or organomegaly.  No scars.  No striae, dilated veins, rashes, or lesions.  No ascites, distension. Extremities: There is moderate swelling bilaterally.  There are venous stasis changes to both legs.  On the right outer shin, there is a 6 x 2 cm irregular ulcers, which is not swollen, draining, or purulent.  It is tender to palpation, but clean.  On the left dorsal foot, the DP pulse is palpable.  There is a roughly 1 cm nodule on the dorsal left foot that is very tender to palpation but not fluctuant.   Neuro: Sensorium intact.  Speech is fluent.  Naming is grossly intact, and the patient's recall, recent and remote, as well as general fund of knowledge seem within normal limits.  Muscle  tone normal, without fasciculations.  Moves all extremities equally and with normal coordination.  Attention span and concentration are within normal limits.          Labs on Admission:  Basic Metabolic Panel:  Recent Labs Lab 04/07/15 1850  NA 137  K 3.8  CL 102  CO2 27  GLUCOSE 209*  BUN 12  CREATININE 1.00  CALCIUM 9.0   Liver Function Tests: No results for input(s): AST, ALT, ALKPHOS, BILITOT, PROT, ALBUMIN in the last 168 hours. No results for input(s): LIPASE, AMYLASE in the last 168 hours. No results for input(s): AMMONIA in the last 168 hours. CBC:  Recent Labs Lab 04/07/15 1850  WBC 6.5  HGB 14.4  HCT 43.4  MCV 96.4  PLT 204   Cardiac Enzymes: No  results for input(s): CKTOTAL, CKMB, CKMBINDEX, TROPONINI in the last 168 hours.  BNP (last 3 results)  Recent Labs  11/07/14 1734 01/30/15 2358 04/07/15 1858  BNP 489.0* 628.7* 462.5*    ProBNP (last 3 results)  Recent Labs  07/08/14 1714  PROBNP 1232.0*    CBG: No results for input(s): GLUCAP in the last 168 hours.  Radiological Exams on Admission: Dg Chest 2 View  04/07/2015   CLINICAL DATA:  Trouble breathing with lower chest pain 3 days.  EXAM: CHEST  2 VIEW  COMPARISON:  01/30/2015 and 11/07/2014  FINDINGS: Lungs are adequately inflated without focal consolidation or effusion. There is mild prominence of the perihilar markings. Mild stable cardiomegaly. Remainder of the exam is unchanged.  IMPRESSION: Mild stable cardiomegaly with suggestion of minimal vascular congestion.   Electronically Signed   By: Elberta Fortis M.D.   On: 04/07/2015 19:11   [place] Misc Soft Tissue  04/08/2015   CLINICAL DATA:  Left foot pain. New plantar nodule in the left foot. Clinical concern for plantar fibromatosis or abscess.  EXAM: ULTRASOUND LEFT LOWER EXTREMITY LIMITED  TECHNIQUE: Ultrasound examination of the lower extremity soft tissues was performed in the area of clinical concern.  COMPARISON:  Radiographs 1 day  prior.  FINDINGS: Targeted sonographic evaluation of the area of clinical concern in the plantar foot demonstrates a hypoechoic ovoid 0.8 x 0.4 x 0.7 cm avascular structure in the subcutaneous tissues in the region of the proximal metatarsals. There is no internal blood flow. Additional smaller hypoechoic structures in the subcutaneous tissues, some of which appear tubular, a few of the smaller structures represent vessels. Right foot is evaluated for comparison demonstrating a similar appearance of hypoechoic structures in the soft tissues.  IMPRESSION: Small sub centimeter hypoechoic nodule in the region of clinical concern in the plantar foot. This does not have the appearance of an abscess. Recommend MRI characterization.   Electronically Signed   By: Rubye Oaks M.D.   On: 04/08/2015 05:30   Dg Foot Complete Left  04/08/2015   CLINICAL DATA:  Left foot pain for 2 days. No known injury. Pain about the fifth proximal metatarsal.  EXAM: LEFT FOOT - COMPLETE 3+ VIEW  COMPARISON:  09/12/2010  FINDINGS: No fracture or dislocation. The alignment and joint spaces are maintained. There is an os peroneal. The bones are under mineralized. There is mild pes planus. Vascular calcifications are seen. There is a plantar calcaneal spur. Mild dorsal soft tissue edema.  IMPRESSION: No acute bony abnormality.  Mild dorsal soft tissue edema.   Electronically Signed   By: Rubye Oaks M.D.   On: 04/08/2015 00:30    EKG: Independently reviewed. Atrial fibrillation without ST depression.  Assessment/Plan Present on Admission:  . Chronic ulcer of lower extremity . Diabetes mellitus type 2 with complications, uncontrolled . Essential hypertension . Chronic combined systolic and diastolic heart failure    1. Chronic ulcer on lower right leg: This does not appear to be infected. Will observe overnight. - Consult Wound Care - Consult Social Work for assistance with medication access assistance and referral  assistance - Consult Care Management for assistance with referral to home health for home wound care  2. 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  There is no ulcer that I see. - Limited ultrasound of plantar nodule.   If this is indeed fibromatosis, conservative treatment would include  analgesics and rest, and definitive treatment would require referral to orthopedics.  3. Chronic systolic and diastolic CHF: Stable - Continue home furosemide, carvedilol, ACEi  4. Afib: Stable.  HR 100 suspected due to missed evening dose of BB. - Continue home carvedilol. - Continue home rivaroxaban  5. T2DM:  Stable - Continue home metformin and glipizide  Consultants: Wound, Social Work, Care Management  Code Status: Full  Family Communication: Wife present at bedside.    Alberteen Sam [facility] Pager [phone]

## 2015-04-08 NOTE — ED Notes (Signed)
Wound care RN called and advised she will be down in a few minutes.

## 2015-04-08 NOTE — ED Notes (Signed)
Pharmacist notified on pt.'s medication (Coreg).

## 2015-04-08 NOTE — ED Notes (Signed)
Patient and family member was given apple juice.

## 2015-04-08 NOTE — Progress Notes (Signed)
Orthopedic Tech Progress Note Patient Details:  Maurice Nguyen 1966/06/02 [id number]  Ortho Devices Type of Ortho Device: Roland Rack boot Ortho Device/Splint Location: bilateral Ortho Device/Splint Interventions: Application   Nikki Dom 04/08/2015, 12:23 PM

## 2015-04-08 NOTE — Progress Notes (Addendum)
LCSW aware of consult for "outpatient wound care" and follow up. Will defer this consult to Care Management once care has been established for wound and plan will be for patient to go home.  Family aware of plan and agreeable. No CSW interventions needed at this time.  It is also documented that patient does not have insurance how he is active with Medicare and Medicaid.  Please re-consult if needed.  Deretha Emory, MSW Clinical Social Work: Emergency Room [phone]

## 2015-04-08 NOTE — ED Notes (Signed)
Admitting MD at bedside.

## 2015-04-08 NOTE — ED Notes (Signed)
Patient transported to Ultrasound 

## 2015-04-08 NOTE — Consult Note (Addendum)
WOC wound consult note Reason for Consult: Consult requested for right leg wound.  Pt states this was a chronic wound which healed when he was wearing Una boots at home, but it re-occurred as soon as they were discontinued.   Wound type: Right outer calf with full thickness stasis ulcer Measurement: 2.5X4X.2cm Wound bed: 100% red and moist Drainage (amount, consistency, odor) Mod amt yellow drainage, no odor Periwound: Intact skin surrounding Dressing procedure/placement/frequency: Pt is currently in the ER, but states he will be admitted.  Social work has discussed need for wound care after discharge earlier. Pt will need home health resumed for weekly Una boot changes f this plan of care is to be continued.  Please order if desired. Ortho tech paged to apply [employer] and coban over foam dressing to right leg and this should be changed Q Tuesday. Discussed plan of care with patient and he denies further questions. Please re-consult if further assistance is needed.  Thank-you,  Cammie Mcgee MSN, RN, CWOCN, [place], CNS [phone]

## 2015-04-08 NOTE — ED Notes (Signed)
Admitting at bedside 

## 2015-04-08 NOTE — ED Notes (Signed)
Attempted report 

## 2015-04-09 DIAGNOSIS — E118 Type 2 diabetes mellitus with unspecified complications: Secondary | ICD-10-CM | POA: Diagnosis not present

## 2015-04-09 DIAGNOSIS — E11621 Type 2 diabetes mellitus with foot ulcer: Secondary | ICD-10-CM | POA: Diagnosis not present

## 2015-04-09 DIAGNOSIS — L97912 Non-pressure chronic ulcer of unspecified part of right lower leg with fat layer exposed: Secondary | ICD-10-CM | POA: Diagnosis not present

## 2015-04-09 DIAGNOSIS — I10 Essential (primary) hypertension: Secondary | ICD-10-CM | POA: Diagnosis not present

## 2015-04-09 DIAGNOSIS — I5042 Chronic combined systolic (congestive) and diastolic (congestive) heart failure: Secondary | ICD-10-CM | POA: Diagnosis not present

## 2015-04-09 LAB — BASIC METABOLIC PANEL
Anion gap: 7 (ref 5–15)
BUN: 13 mg/dL (ref 6–20)
CO2: 28 mmol/L (ref 22–32)
Calcium: 8.6 mg/dL — ABNORMAL LOW (ref 8.9–10.3)
Chloride: 104 mmol/L (ref 101–111)
Creatinine, Ser: 1.02 mg/dL (ref 0.61–1.24)
GFR calc Af Amer: >60 mL/min (ref >60)
GFR calc non Af Amer: >60 mL/min (ref >60)
Glucose, Bld: 120 mg/dL — ABNORMAL HIGH (ref 65–99)
Potassium: 3.5 mmol/L (ref 3.5–5.1)
Sodium: 139 mmol/L (ref 135–145)

## 2015-04-09 LAB — GLUCOSE, CAPILLARY
Glucose-Capillary: 136 mg/dL — ABNORMAL HIGH (ref 65–99)
Glucose-Capillary: 191 mg/dL — ABNORMAL HIGH (ref 65–99)

## 2015-04-09 MED ORDER — OXYCODONE-ACETAMINOPHEN 5-325 MG PO TABS: 1.0000 | ORAL_TABLET | Freq: Four times a day (QID) | ORAL | Status: DC | PRN

## 2015-04-09 NOTE — Discharge Summary (Signed)
PATIENT DETAILS Name: Maurice Nguyen Age: 49 y.o. Sex: male Date of Birth: 09/22/1965 MRN: [id number]. Admitting Physician: Alberteen Sam, MD NWG:[id number],YQMVH Oswaldo Done, MD  Admit Date: 04/07/2015 Discharge date: 04/09/2015  Recommendations for Outpatient Follow-up:  1. Please consider referral to wound care clinic and orthopedic/podiatry (? Morton's neuroma/ tender plantar wart in left foot) 2. Please repeat CBC/BMET at next visit  PRIMARY DISCHARGE DIAGNOSIS:  Principal Problem:   Chronic ulcer of lower extremity Active Problems:   Diabetes mellitus type 2 with complications, uncontrolled   Essential hypertension   Anticoagulated, on Xarelto, Chads2Vas score 2   Chronic combined systolic and diastolic heart failure      PAST MEDICAL HISTORY: Past Medical History  Diagnosis Date  . Morbid obesity   . Lumbar herniated disc   . 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 Obesity, morbid, BMI 50 or higher 07/13/2012  . Hypertension 07/13/2012  . Back pain, chronic 07/13/2012  . CHF (congestive heart failure)   . Irregular heart beat   . Shortness of breath   . Anticoagulated, on Xarelto, Chads2Vas score 2 10/02/2013  . Chronic ulcer of lower extremity     right lateral leg  . Type II diabetes mellitus 07/13/2012  . Complication of anesthesia     " THEY LOST ME AND BROUGHT ME BACK "    DISCHARGE MEDICATIONS: Current Discharge Medication List    START taking these medications   Details  oxyCODONE-acetaminophen (PERCOCET/ROXICET) 5-325 MG per tablet Take 1 tablet by mouth every 6 (six) hours as needed for moderate pain. Qty: 15 tablet, Refills: 0      CONTINUE these medications which have NOT CHANGED   Details  carvedilol (COREG) 6.25 MG tablet Take 1 tablet (6.25 mg total) by mouth 2 (two) times daily. Qty: 60 tablet, Refills: 11    furosemide (LASIX) 20 MG tablet Take 3 tablets (60 mg total) by mouth 2 (two) times daily. Qty: 90 tablet, Refills: 1      glipiZIDE (GLUCOTROL) 10 MG tablet Take 1 tablet (10 mg total) by mouth 2 (two) times daily before a meal. Qty: 60 tablet, Refills: 1    metFORMIN (GLUCOPHAGE) 1000 MG tablet Take 1 tablet (1,000 mg total) by mouth 2 (two) times daily with a meal. Qty: 60 tablet, Refills: 1    nitroGLYCERIN (NITROSTAT) 0.4 MG SL tablet Place 1 tablet (0.4 mg total) under the tongue every 5 (five) minutes x 3 doses as needed for chest pain. Qty: 30 tablet, Refills: 12    potassium chloride SA (K-DUR,KLOR-CON) 20 MEQ tablet Take 1 tablet (20 mEq total) by mouth 2 (two) times daily. Qty: 60 tablet, Refills: 3    rivaroxaban (XARELTO) 20 MG TABS tablet Take 1 tablet (20 mg total) by mouth daily with supper. Qty: 30 tablet, Refills: 1    valsartan (DIOVAN) 80 MG tablet Take 1 tablet (80 mg total) by mouth daily. Qty: 31 tablet, Refills: 11      STOP taking these medications     traMADol (ULTRAM) 50 MG tablet         ALLERGIES:  No Known Allergies  BRIEF HPI:  See H&P, Labs, Consult and Test reports for all details in brief, patient was admitted for evaluation of right leg ulcer.  CONSULTATIONS:   None  PERTINENT RADIOLOGIC STUDIES: Dg Chest 2 View  04/07/2015   CLINICAL DATA:  Trouble breathing with lower chest pain 3 days.  EXAM: CHEST  2 VIEW  COMPARISON:  01/30/2015 and 11/07/2014  FINDINGS: Lungs are adequately inflated without focal consolidation or effusion. There is mild prominence of the perihilar markings. Mild stable cardiomegaly. Remainder of the exam is unchanged.  IMPRESSION: Mild stable cardiomegaly with suggestion of minimal vascular congestion.   Electronically Signed   By: Elberta Fortis M.D.   On: 04/07/2015 19:11   [place] Misc Soft Tissue  04/08/2015   CLINICAL DATA:  Left foot pain. New plantar nodule in the left foot. Clinical concern for plantar fibromatosis or abscess.  EXAM: ULTRASOUND LEFT LOWER EXTREMITY LIMITED  TECHNIQUE: Ultrasound examination of the lower extremity soft  tissues was performed in the area of clinical concern.  COMPARISON:  Radiographs 1 day prior.  FINDINGS: Targeted sonographic evaluation of the area of clinical concern in the plantar foot demonstrates a hypoechoic ovoid 0.8 x 0.4 x 0.7 cm avascular structure in the subcutaneous tissues in the region of the proximal metatarsals. There is no internal blood flow. Additional smaller hypoechoic structures in the subcutaneous tissues, some of which appear tubular, a few of the smaller structures represent vessels. Right foot is evaluated for comparison demonstrating a similar appearance of hypoechoic structures in the soft tissues.  IMPRESSION: Small sub centimeter hypoechoic nodule in the region of clinical concern in the plantar foot. This does not have the appearance of an abscess. Recommend MRI characterization.   Electronically Signed   By: Rubye Oaks M.D.   On: 04/08/2015 05:30   Dg Foot Complete Left  04/08/2015   CLINICAL DATA:  Left foot pain for 2 days. No known injury. Pain about the fifth proximal metatarsal.  EXAM: LEFT FOOT - COMPLETE 3+ VIEW  COMPARISON:  09/12/2010  FINDINGS: No fracture or dislocation. The alignment and joint spaces are maintained. There is an os peroneal. The bones are under mineralized. There is mild pes planus. Vascular calcifications are seen. There is a plantar calcaneal spur. Mild dorsal soft tissue edema.  IMPRESSION: No acute bony abnormality.  Mild dorsal soft tissue edema.   Electronically Signed   By: Rubye Oaks M.D.   On: 04/08/2015 00:30     PERTINENT LAB RESULTS: CBC:  Recent Labs  04/07/15 1850  WBC 6.5  HGB 14.4  HCT 43.4  PLT 204   CMET CMP     Component Value Date/Time   NA 139 04/09/2015 0346   K 3.5 04/09/2015 0346   CL 104 04/09/2015 0346   CO2 28 04/09/2015 0346   GLUCOSE 120* 04/09/2015 0346   BUN 13 04/09/2015 0346   CREATININE 1.02 04/09/2015 0346   CALCIUM 8.6* 04/09/2015 0346   PROT 6.7 01/30/2015 1916   ALBUMIN 3.3*  01/30/2015 1916   AST 30 01/30/2015 1916   ALT 34 01/30/2015 1916   ALKPHOS 55 01/30/2015 1916   BILITOT 1.2 01/30/2015 1916   GFRNONAA >60 04/09/2015 0346   GFRAA >60 04/09/2015 0346    GFR Estimated Creatinine Clearance: 155 mL/min (by C-G formula based on Cr of 1.02). No results for input(s): LIPASE, AMYLASE in the last 72 hours. No results for input(s): CKTOTAL, CKMB, CKMBINDEX, TROPONINI in the last 72 hours. Invalid input(s): POCBNP No results for input(s): DDIMER in the last 72 hours. No results for input(s): HGBA1C in the last 72 hours. No results for input(s): CHOL, HDL, LDLCALC, TRIG, CHOLHDL, LDLDIRECT in the last 72 hours. No results for input(s): TSH, T4TOTAL, T3FREE, THYROIDAB in the last 72 hours.  Invalid input(s): FREET3 No results for input(s): VITAMINB12, FOLATE, FERRITIN, TIBC, IRON, RETICCTPCT in the last 72 hours.  Coags: No results for input(s): INR in the last 72 hours.  Invalid input(s): PT Microbiology: No results found for this or any previous visit (from the past 240 hour(s)).   BRIEF HOSPITAL COURSE:  Chronic ulcer of lower extremity:consistent with venous stasis ulcer. Seen by wound care services, restarted on Unna boots (was on it a few weeks packs-was then discontinued). Case management consulted, home health RN and other services set up. Patient asked to follow-up with his PCP, and before home health services get discontinued, he has been asked to see if he can get a referral to wound care clinic.  Active Problems: Lower ext swelling:suspect multifactorial from Chronic venous stasis/Chronic CHF. Continue Lasix, Unna boots on discharge  ?Morton's Neuroma:in plantar surface of left foot: Further work up deferred to the outpatient setting. Supportive care with as needed narcotics-however claims pain to be much better than on admission.  Chronic systolic and diastolic NWG:NFAOZHYQ has worsening leg swelling-suspect this is more from Venous  stasis-rather than CHF exac-has lost weight compared to most recent admit.Weight on discharge 433 pounds. Lungs are clear on exam. Patient asked to follow-up with his cardiologist at his next appointment.  Diabetes mellitus type 2 with complications, uncontrolled:CBG's stable-continue Glipizide and Metformin  Essential hypertension:controlled-continue with Avapro and Coreg  PAF:on Coreg and Xarelto. Stable  Chronic back pain: Going on for "years". Few days supply of Percocet provided patient at his request. Patient aware that further narcotic prescriptions will be given by PCP.  TODAY-DAY OF DISCHARGE:  Subjective:   [place] today has no headache,no chest abdominal pain,no new weakness tingling or numbness, feels much better wants to go home today.   Objective:   Blood pressure 131/96, pulse 53, temperature 98.3 F (36.8 C), temperature source Oral, resp. rate 18, weight 196.544 kg (433 lb 4.8 oz), SpO2 98 %.  Intake/Output Summary (Last 24 hours) at 04/09/15 0935 Last data filed at 04/09/15 0448  Gross per 24 hour  Intake    840 ml  Output   1700 ml  Net   -860 ml   Filed Weights   04/07/15 1819 04/08/15 1456 04/09/15 0447  Weight: 197.513 kg (435 lb 7 oz) 197.315 kg (435 lb) 196.544 kg (433 lb 4.8 oz)    Exam Awake Alert, Oriented *3, No new F.N deficits, Normal affect [place].AT,PERRAL Supple Neck,No JVD, No cervical lymphadenopathy appriciated.  Symmetrical Chest wall movement, Good air movement bilaterally, CTAB RRR,No Gallops,Rubs or new Murmurs, No Parasternal Heave +ve B.Sounds, Abd Soft, Non tender, No organomegaly appriciated, No rebound -guarding or rigidity. No Cyanosis, Clubbing or edema, No new Rash or bruise. Unna boots in place  DISCHARGE CONDITION: Stable  DISPOSITION: Home with home health services  DISCHARGE INSTRUCTIONS:    Activity:  As tolerated with Full fall precautions use walker/cane & assistance as needed  apply Una boots and coban  over foam dressing to right leg and this should be changed Q Tuesday  Get Medicines reviewed and adjusted: Please take all your medications with you for your next visit with your Primary MD  Please request your Primary MD to go over all hospital tests and procedure/radiological results at the follow up, please ask your Primary MD to get all Hospital records sent to his/her office.  If you experience worsening of your admission symptoms, develop shortness of breath, life threatening emergency, suicidal or homicidal thoughts you must seek medical attention immediately by calling 911 or calling your MD immediately  if symptoms less severe.  You must read  complete instructions/literature along with all the possible adverse reactions/side effects for all the Medicines you take and that have been prescribed to you. Take any new Medicines after you have completely understood and accpet all the possible adverse reactions/side effects.   Do not drive when taking Pain medications.   Do not take more than prescribed Pain, Sleep and Anxiety Medications  Special Instructions: If you have smoked or chewed Tobacco  in the last 2 yrs please stop smoking, stop any regular Alcohol  and or any Recreational drug use.  Wear Seat belts while driving.  Please note  You were cared for by a hospitalist during your hospital stay. Once you are discharged, your primary care physician will handle any further medical issues. Please note that NO REFILLS for any discharge medications will be authorized once you are discharged, as it is imperative that you return to your primary care physician (or establish a relationship with a primary care physician if you do not have one) for your aftercare needs so that they can reassess your need for medications and monitor your lab values.   Diet recommendation: Diabetic Diet Heart Healthy diet  Discharge Instructions    (HEART FAILURE PATIENTS) Call MD:  Anytime you have any of  the following symptoms: 1) 3 pound weight gain in 24 hours or 5 pounds in 1 week 2) shortness of breath, with or without a dry hacking cough 3) swelling in the hands, feet or stomach 4) if you have to sleep on extra pillows at night in order to breathe.    Complete by:  As directed      Call MD for:  redness, tenderness, or signs of infection (pain, swelling, redness, odor or green/yellow discharge around incision site)    Complete by:  As directed      Diet - low sodium heart healthy    Complete by:  As directed      Diet Carb Modified    Complete by:  As directed      Increase activity slowly    Complete by:  As directed            Follow-up Information    Follow up with Burtis Junes, MD. Schedule an appointment as soon as possible for a visit in 1 week.   Specialty:  Family Medicine   Contact information:   [address] [address] [place] [address] [phone]       Total Time spent on discharge equals 45 minutes.  SignedJeoffrey Massed 04/09/2015 9:35 AM

## 2015-04-09 NOTE — Care Management Note (Signed)
Case Management Note  Patient Details  Name: Maurice Nguyen MRN: [id number] Date of Birth: June 24, 1966  Subjective/Objective:                    Action/Plan:  Patient wanting to resume services with [facility] , referral given to Tamala Bari at [facility]  Expected Discharge Date:                  Expected Discharge Plan:  Home w Home Health Services  In-House Referral:     Discharge planning Services  CM Consult  Post Acute Care Choice:  Home Health Choice offered to:     DME Arranged:    DME Agency:     HH Arranged:  RN HH Agency:     Status of Service:  Completed, signed off  Medicare Important Message Given:    Date Medicare IM Given:    Medicare IM give by:    Date Additional Medicare IM Given:    Additional Medicare Important Message give by:     If discussed at Long Length of Stay Meetings, dates discussed:    Additional Comments:  Kingsley Plan, RN 04/09/2015, 8:55 AM

## 2015-04-09 NOTE — Discharge Instructions (Signed)
Apply Una boots and coban over foam dressing to right leg and this should be changed Q Tuesday  Follow with Primary MD  Burtis Junes, MD  and Your Cardiologist in 1-2 weeks  Please ask your primary MD to refer you to a Wound care Clinic(for right leg ulcer) and to Orthopedics MD (if you continue to have pain in the undersurface of your left foot)  You have Congestive Heart Failure: Please call your Cardiologist or Primary MD-Anytime you have any of the following symptoms: 1) 3 pound weight gain in 24 hours or 5 pounds in 1 week 2) shortness of breath, with or without a dry hacking cough 3) swelling in the hands, feet or stomach 4) if you have to sleep on extra pillows at night in order to breathe Follow cardiac low salt diet and 1.5 lit/day fluid restriction.  Get Medicines reviewed and adjusted. Please take all your medications with you for your next visit with your Primary MD  Please request your Primary MD to go over all hospital tests and procedure/radiological results at the follow up, please ask your Primary MD to get all Hospital records sent to his/her office.  If you experience worsening of your admission symptoms, develop shortness of breath, life threatening emergency, suicidal or homicidal thoughts you must seek medical attention immediately by calling 911 or calling your MD immediately  if symptoms less severe.  You must read complete instructions/literature along with all the possible adverse reactions/side effects for all the Medicines you take and that have been prescribed to you. Take any new Medicines after you have completely understood and accpet all the possible adverse reactions/side effects.   Do not drive when taking Pain medications.   Do not take more than prescribed Pain, Sleep and Anxiety Medications  Special Instructions: If you have smoked or chewed Tobacco  in the last 2 yrs please stop smoking, stop any regular Alcohol  and or any Recreational drug  use.  Wear Seat belts while driving.  Please note  You were cared for by a hospitalist during your hospital stay. Once you are discharged, your primary care physician will handle any further medical issues. Please note that NO REFILLS for any discharge medications will be authorized once you are discharged, as it is imperative that you return to your primary care physician (or establish a relationship with a primary care physician if you do not have one) for your aftercare needs so that they can reassess your need for medications and monitor your lab values.

## 2015-05-20 ENCOUNTER — Ambulatory Visit (INDEPENDENT_AMBULATORY_CARE_PROVIDER_SITE_OTHER): Payer: Medicare Other | Admitting: Cardiovascular Disease

## 2015-05-20 ENCOUNTER — Encounter: Payer: Self-pay | Admitting: Cardiovascular Disease

## 2015-05-20 VITALS — BP 130/84 | HR 69 | Ht 74.0 in | Wt >= 6400 oz

## 2015-05-20 DIAGNOSIS — I5041 Acute combined systolic (congestive) and diastolic (congestive) heart failure: Secondary | ICD-10-CM | POA: Diagnosis not present

## 2015-05-20 DIAGNOSIS — Z79899 Other long term (current) drug therapy: Secondary | ICD-10-CM

## 2015-05-20 DIAGNOSIS — I4891 Unspecified atrial fibrillation: Secondary | ICD-10-CM | POA: Diagnosis not present

## 2015-05-20 DIAGNOSIS — I5042 Chronic combined systolic (congestive) and diastolic (congestive) heart failure: Secondary | ICD-10-CM

## 2015-05-20 MED ORDER — TORSEMIDE 20 MG PO TABS: 40.0000 mg | ORAL_TABLET | Freq: Every day | ORAL | Status: DC

## 2015-05-20 MED ORDER — POTASSIUM CHLORIDE CRYS ER 20 MEQ PO TBCR: 40.0000 meq | EXTENDED_RELEASE_TABLET | Freq: Every day | ORAL | Status: DC

## 2015-05-20 NOTE — Progress Notes (Signed)
Maurice Nguyen Date of Birth: Aug 02, 1965 Medical Record #[id number]   Problem List 1. Combined systolic and diastolic congestive heart failure 2. Paroxysmal atrial fibrillation 3. Essential hypertension 4. Morbid obesity 5. Diabetes mellitus    History of Present Illness: Maurice Nguyen is seen back today for a post hospital visit.  - Maurice Nguyen was siiin  in consultation back in 2013. Maurice Nguyen is a 49 year old male with PAF, mixed CHF with systolic dysfunction, DM, chronic back pain, morbid obesity, HTN and non compliance. CHADSVAS of 3 (DM, HTN, CHF) with 3.2% estimated annual stroke risk.     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 Last seen by Dr. Excell Seltzer in January of 2014.   Most recently presented to [facility] with worsening edema and DOE. Noted to be in AF with RVR. Had ran out of his Lasix. His AF was difficult to control - now on CCB and high dose BB therapy. On xarelto. Diuresed down from 460 to 444.   Comes in today. Here with his girlfriend. Says Maurice Nguyen is feeling "great". Says his swelling has improved. Says Maurice Nguyen has scales at home - weight 444 to 447 at home. No chest pain. Tries to watch his salt. Not short of breath. Mostly limited by back pain. Says Maurice Nguyen can afford his medicine.   Dec. 17, 2015:  Maurice Nguyen is a 49 yo with hx of morbid obesity, Atrial fib, ruptured appendix.  I met him at [facility] in 2013.  This is his first OV with me. Maurice Nguyen had run out of his meds. Presented to the ER with shortness of breath.  Was restarted on lasix.  Has lost 14 lbs over the past 3 days.  Exercising some.   Has had leg swelling.  Was difficult for him to walk.    Echo in March, 2015: TDS: pooracoustic windows, poor sound wave transmission, and body habitus. Intravenous contrast (Definity) was administered. - Left ventricle: The cavity size was normal. There was moderate concentric hypertrophy. Systolic function was mildly reduced. The  estimated ejection fraction was in the range of 45% to 50%. Definity contrast was administered which did not aid in wall motion or EF determination. The study is not technically sufficient to allow evaluation of LV diastolic function. - Mitral valve: Poorly visualized. Mild regurgitation. - Left atrium: The atrium was mildly dilated. - Inferior vena cava: The vessel was dilated; the respirophasic diameter changes were blunted (< 50%); findings are consistent with elevated central venous pressure. - Pericardium, extracardiac: There was no pericardial effusion.    Maurice Nguyen is doing well.    01/30/2015:  Is not taking his meds.  Has had some rectal bleeding, coughing.  So Maurice Nguyen stopped taking his heart meds - still took his diabetic meds and lasix .  Has been very short of breath for the past 2 - 3 days   Was in the hospital in April , 2016 for leg edema and ulceration .  O2 sats are 95% today at rest.  Oct. 25, 2016:  Maurice Nguyen has been back in the hospital for an ulcers on both legs. Maurice Nguyen is now wearing compression Ace wraps.  His breathing seems to be going pretty well. Maurice Nguyen's taking his cardiac medications.     Current Outpatient Prescriptions  Medication Sig Dispense Refill  . carvedilol (COREG) 6.25 MG tablet Take 1 tablet (6.25 mg total) by mouth 2 (two) times daily. 60 tablet 11  . furosemide (LASIX) 20  MG tablet Take 3 tablets (60 mg total) by mouth 2 (two) times daily. (Patient taking differently: Take 60 mg by mouth daily. Takes 11m once daily, can take 3 more tabs as needed.) 90 tablet 1  . furosemide (LASIX) 20 MG tablet Take 20 mg by mouth 3 (three) times daily.    .Marland KitchenglipiZIDE (GLUCOTROL) 10 MG tablet Take 1 tablet (10 mg total) by mouth 2 (two) times daily before a meal. 60 tablet 1  . metFORMIN (GLUCOPHAGE) 1000 MG tablet Take 1 tablet (1,000 mg total) by mouth 2 (two) times daily with a meal. 60 tablet 1  . nitroGLYCERIN (NITROSTAT) 0.4 MG SL tablet Place 1  tablet (0.4 mg total) under the tongue every 5 (five) minutes x 3 doses as needed for chest pain. 30 tablet 12  . oxyCODONE-acetaminophen (PERCOCET/ROXICET) 5-325 MG per tablet Take 1 tablet by mouth every 6 (six) hours as needed for moderate pain. 15 tablet 0  . potassium chloride SA (K-DUR,KLOR-CON) 20 MEQ tablet Take 1 tablet (20 mEq total) by mouth 2 (two) times daily. 60 tablet 3  . rivaroxaban (XARELTO) 20 MG TABS tablet Take 1 tablet (20 mg total) by mouth daily with supper. 30 tablet 1  . valsartan (DIOVAN) 80 MG tablet Take 1 tablet (80 mg total) by mouth daily. 31 tablet 11   No current facility-administered medications for this visit.    No Known Allergies  Past Medical History  Diagnosis Date  . Morbid obesity ([facility]   . Lumbar herniated disc   . 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 Obesity, morbid, BMI 50 or higher ([facility] 07/13/2012  . Hypertension 07/13/2012  . Back pain, chronic 07/13/2012  . CHF (congestive heart failure) ([place]   . Irregular heart beat   . Shortness of breath   . Anticoagulated, on Xarelto, Chads2Vas score 2 10/02/2013  . Chronic ulcer of lower extremity (HCC)     right lateral leg  . Type II diabetes mellitus ([facility] 07/13/2012  . Complication of anesthesia     " THEY LOST ME AND BROUGHT ME BACK "    Past Surgical History  Procedure Laterality Date  . Abscess drainage    . Dental surgery    . Colonoscopy  08/22/2012    Procedure: COLONOSCOPY;  Surgeon: VLear Ng MD;  Location: [facility];  Service: Endoscopy;  Laterality: N/A;    History  Smoking status  . Never Smoker   Smokeless tobacco  . Never Used    History  Alcohol Use No    Comment: rarely    No family history on file.  Review of Systems: The review of systems is per the HPI.  All other systems were reviewed and are negative.  Physical Exam: BP 130/84 mmHg  Pulse 69  Ht '6\' 2"'  (1.88 m)  Wt 427 lb (193.686 kg)  BMI 54.80 kg/m2 Patient is massively obese but in no  acute distress. Skin is -diaphoritic.    Color is normal.  HEENT is unremarkable. Normocephalic/atraumatic.  PERRL. Sclera are nonicteric. Neck is supple. No masses. No JVD.  Lungs are clear. Cardiac exam shows an irregular rhythm. Rate is ok. Heart tones quite distant. Abdomen is soft. Extremities are quite full with 3+ edema.  Chronic stasis changes  Gait and ROM are intact.  Maurice Nguyen had significant dyspnea with walking back to the exam room   No gross neurologic deficits noted.   Wt Readings from Last 3 Encounters:  05/20/15 427 lb (193.686 kg)  04/09/15 433  lb 4.8 oz (196.544 kg)  02/03/15 438 lb 14.4 oz (199.084 kg)     LABORATORY DATA: EKG today with AF with rate of 106.    Lab Results  Component Value Date   WBC 6.5 04/07/2015   HGB 14.4 04/07/2015   HCT 43.4 04/07/2015   PLT 204 04/07/2015   GLUCOSE 120* 04/09/2015   CHOL  02/11/2010    159        ATP III CLASSIFICATION:  <200     mg/dL   Desirable  200-239  mg/dL   Borderline High  >=240    mg/dL   High          TRIG 58 02/11/2010   HDL 59 02/11/2010   LDLCALC  02/11/2010    88        Total Cholesterol/HDL:CHD Risk Coronary Heart Disease Risk Table                     Men   Women  1/2 Average Risk   3.4   3.3  Average Risk       5.0   4.4  2 X Average Risk   9.6   7.1  3 X Average Risk  23.4   11.0        Use the calculated Patient Ratio above and the CHD Risk Table to determine the patient's CHD Risk.        ATP III CLASSIFICATION (LDL):  <100     mg/dL   Optimal  100-129  mg/dL   Near or Above                    Optimal  130-159  mg/dL   Borderline  160-189  mg/dL   High  >190     mg/dL   Very High   ALT 34 01/30/2015   AST 30 01/30/2015   NA 139 04/09/2015   K 3.5 04/09/2015   CL 104 04/09/2015   CREATININE 1.02 04/09/2015   BUN 13 04/09/2015   CO2 28 04/09/2015   TSH 2.421 09/27/2013   PSA 0.22 Test Methodology: Hybritech PSA 02/11/2010   INR 1.04 09/27/2013   HGBA1C 8.6* 11/07/2014    MICROALBUR 0.99 02/11/2010     Echo Study Conclusions from March 2015  - Procedure narrative: Transthoracic echocardiography. The study was technically difficult, as a result of poor acoustic windows, poor sound wave transmission, and body habitus. Intravenous contrast (Definity) was administered. - Left ventricle: The cavity size was normal. There was moderate concentric hypertrophy. Systolic function was mildly reduced. The estimated ejection fraction was in the range of 45% to 50%. Definity contrast was administered which did not aid in wall motion or EF determination. The study is not technically sufficient to allow evaluation of LV diastolic function. - Mitral valve: Poorly visualized. Mild regurgitation. - Left atrium: The atrium was mildly dilated. - Inferior vena cava: The vessel was dilated; the respirophasic diameter changes were blunted (< 50%); findings are consistent with elevated central venous pressure. - Pericardium, extracardiac: There was no pericardial effusion.   Assessment / Plan:  1. Combined systolic and diastolic congestive heart failure -   It seems that Maurice Nguyen is not taking his medicines   quite right. Maurice Nguyen has noticed that the Lasix does not cause much of a diuresis. We'll change his Lasix to torsemide 40 mg a day. Maurice Nguyen's not taking his potassium as prescribed. Will have him take potassium chloride 40 mEq every morning. We'll  check a basic metabolic profile in 3 weeks. I'll see him again in 3 months.   2. Paroxysmal atrial fibrillation -    3. Essential hypertension - will start valsartan 80 mg a day.  BP is normal   4. Morbid obesity 5. Diabetes mellitus    Apurva Reily, Wonda Cheng, MD  05/20/2015 9:48 AM    [facility] [place],  [address] [place], [place]  [address] Pager [phone] Phone: [phone]; Fax: [phone]   [facility]  [address] [place], [place]  [address] [phone]    Fax  [phone]

## 2015-05-20 NOTE — Patient Instructions (Addendum)
Medication Instructions:   STOP TAKING LASIX   START TAKING TORSEMIDE 40 MG ONCE A DAY   START TAKING POTASSIUM   K-DUR  40 MEQ ONCE A DAY IN THE AM    Labwork: BMET  IN 3 WEEKS  WEEK 06/10/15    Testing/Procedures:NONE ORDER TODAY    Follow-Up:  IN 3 MONTHS WITH DR Elease Hashimoto    Any Other Special Instructions Will Be Listed Below (If Applicable).

## 2015-06-10 ENCOUNTER — Other Ambulatory Visit: Payer: Medicare Other

## 2015-06-11 ENCOUNTER — Other Ambulatory Visit (HOSPITAL_COMMUNITY): Payer: Self-pay | Admitting: Foot & Ankle Surgery

## 2015-06-11 DIAGNOSIS — I739 Peripheral vascular disease, unspecified: Secondary | ICD-10-CM

## 2015-06-16 ENCOUNTER — Ambulatory Visit (HOSPITAL_COMMUNITY)
Admission: RE | Admit: 2015-06-16 | Discharge: 2015-06-16 | Disposition: A | Discharge status: Home / Self Care | Payer: Medicare Other | Source: Ambulatory Visit | Attending: Cardiology | Admitting: Cardiology

## 2015-06-16 DIAGNOSIS — I739 Peripheral vascular disease, unspecified: Secondary | ICD-10-CM | POA: Diagnosis not present

## 2015-08-20 ENCOUNTER — Ambulatory Visit: Payer: Medicare Other | Admitting: Cardiovascular Disease

## 2015-08-27 ENCOUNTER — Ambulatory Visit (INDEPENDENT_AMBULATORY_CARE_PROVIDER_SITE_OTHER): Payer: Medicare Other | Admitting: Cardiovascular Disease

## 2015-08-27 ENCOUNTER — Telehealth: Payer: Self-pay | Admitting: Cardiovascular Disease

## 2015-08-27 ENCOUNTER — Encounter: Payer: Self-pay | Admitting: Cardiovascular Disease

## 2015-08-27 VITALS — BP 126/100 | HR 90 | Ht 74.0 in | Wt >= 6400 oz

## 2015-08-27 DIAGNOSIS — I5041 Acute combined systolic (congestive) and diastolic (congestive) heart failure: Secondary | ICD-10-CM | POA: Diagnosis not present

## 2015-08-27 NOTE — Telephone Encounter (Signed)
Walk in pt form-Disability Pla-Card paper dropped off- gave to [facility] Dr. Elease Hashimoto

## 2015-08-27 NOTE — Progress Notes (Signed)
[name] Date of Birth: 1965-10-04 Medical Record #[id number]   Problem List 1. Combined systolic and diastolic congestive heart failure 2. Paroxysmal atrial fibrillation 3. Essential hypertension 4. Morbid obesity 5. Diabetes mellitus    History of Present Illness: Mr. Miranda is seen back today for a post hospital visit.  - he was siiin  in consultation back in 2013. He is a 50 year old male with PAF, mixed CHF with systolic dysfunction, DM, chronic back pain, morbid obesity, HTN and non compliance. CHADSVAS of 3 (DM, HTN, CHF) with 3.2% estimated annual stroke risk.     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 Last seen by Dr. Excell Seltzer in January of 2014.   Most recently presented to [facility] with worsening edema and DOE. Noted to be in AF with RVR. Had ran out of his Lasix. His AF was difficult to control - now on CCB and high dose BB therapy. On xarelto. Diuresed down from 460 to 444.   Comes in today. Here with his girlfriend. Says he is feeling "great". Says his swelling has improved. Says he has scales at home - weight 444 to 447 at home. No chest pain. Tries to watch his salt. Not short of breath. Mostly limited by back pain. Says he can afford his medicine.   Dec. 17, 2015:  Mr. Tauzin is a 50 yo with hx of morbid obesity, Atrial fib, ruptured appendix.  I met him at [facility] in 2013.  This is his first OV with me. He had run out of his meds. Presented to the ER with shortness of breath.  Was restarted on lasix.  Has lost 14 lbs over the past 3 days.  Exercising some.   Has had leg swelling.  Was difficult for him to walk.    Echo in March, 2015: TDS: pooracoustic windows, poor sound wave transmission, and body habitus. Intravenous contrast (Definity) was administered. - Left ventricle: The cavity size was normal. There was moderate concentric hypertrophy. Systolic function was mildly reduced. The  estimated ejection fraction was in the range of 45% to 50%. Definity contrast was administered which did not aid in wall motion or EF determination. The study is not technically sufficient to allow evaluation of LV diastolic function. - Mitral valve: Poorly visualized. Mild regurgitation. - Left atrium: The atrium was mildly dilated. - Inferior vena cava: The vessel was dilated; the respirophasic diameter changes were blunted (< 50%); findings are consistent with elevated central venous pressure. - Pericardium, extracardiac: There was no pericardial effusion.    He is doing well.    01/30/2015:  Is not taking his meds.  Has had some rectal bleeding, coughing.  So he stopped taking his heart meds - still took his diabetic meds and lasix .  Has been very short of breath for the past 2 - 3 days   Was in the hospital in April , 2016 for leg edema and ulceration .  O2 sats are 95% today at rest.  Oct. 25, 2016:  Amal has been back in the hospital for an ulcers on both legs. He is now wearing compression Ace wraps.  His breathing seems to be going pretty well. He's taking his cardiac medications.  Feb. 1, 2017:   Willey is doing ok.  Still has severe DOE ,   DOE has been improving .  no CP .  Needs to find another primary medical doctor  - Dr. Kennon Holter  died recently   Wt Readings from Last 3 Encounters:  08/27/15 434 lb 12.8 oz (197.224 kg)  05/20/15 427 lb (193.686 kg)  04/09/15 433 lb 4.8 oz (196.544 kg)   Says hes cutting back on carbs.  Is avoiding salt    Current Outpatient Prescriptions  Medication Sig Dispense Refill  . carvedilol (COREG) 6.25 MG tablet Take 1 tablet (6.25 mg total) by mouth 2 (two) times daily. 60 tablet 11  . furosemide (LASIX) 20 MG tablet Take 60 mg by mouth 2 (two) times daily.  0  . glipiZIDE (GLUCOTROL) 10 MG tablet Take 1 tablet (10 mg total) by mouth 2 (two) times daily before a meal. 60 tablet 1  . metFORMIN  (GLUCOPHAGE) 1000 MG tablet Take 1 tablet (1,000 mg total) by mouth 2 (two) times daily with a meal. 60 tablet 1  . nitroGLYCERIN (NITROSTAT) 0.4 MG SL tablet Place 1 tablet (0.4 mg total) under the tongue every 5 (five) minutes x 3 doses as needed for chest pain. 30 tablet 12  . oxyCODONE-acetaminophen (PERCOCET/ROXICET) 5-325 MG per tablet Take 1 tablet by mouth every 6 (six) hours as needed for moderate pain. 15 tablet 0  . potassium chloride SA (K-DUR,KLOR-CON) 20 MEQ tablet Take 2 tablets (40 mEq total) by mouth daily. TAKE IN THE AM 60 tablet 4  . rivaroxaban (XARELTO) 20 MG TABS tablet Take 1 tablet (20 mg total) by mouth daily with supper. 30 tablet 1  . torsemide (DEMADEX) 20 MG tablet Take 2 tablets (40 mg total) by mouth daily. 60 tablet 4  . valsartan (DIOVAN) 80 MG tablet Take 1 tablet (80 mg total) by mouth daily. 31 tablet 11   No current facility-administered medications for this visit.    No Known Allergies  Past Medical History  Diagnosis Date  . Morbid obesity ([place])   . Lumbar herniated disc   . 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 Obesity, morbid, BMI 50 or higher ([place]) 07/13/2012  . Hypertension 07/13/2012  . Back pain, chronic 07/13/2012  . CHF (congestive heart failure) ([place])   . Irregular heart beat   . Shortness of breath   . Anticoagulated, on Xarelto, Chads2Vas score 2 10/02/2013  . Chronic ulcer of lower extremity (HCC)     right lateral leg  . Type II diabetes mellitus ([place]) 07/13/2012  . Complication of anesthesia     " THEY LOST ME AND BROUGHT ME BACK "    Past Surgical History  Procedure Laterality Date  . Abscess drainage    . Dental surgery    . Colonoscopy  08/22/2012    Procedure: COLONOSCOPY;  Surgeon: Lear Ng, MD;  Location: [facility];  Service: Endoscopy;  Laterality: N/A;    History  Smoking status  . Never Smoker   Smokeless tobacco  . Never Used    History  Alcohol Use No    Comment: rarely    No family history on  file.  Review of Systems: The review of systems is per the HPI.  All other systems were reviewed and are negative.  Physical Exam: BP 126/100 mmHg  Pulse 90  Ht '6\' 2"'  (1.88 m)  Wt 434 lb 12.8 oz (197.224 kg)  BMI 55.80 kg/m2 Patient is massively obese but in no acute distress. Skin is -diaphoritic.    Color is normal.  HEENT is unremarkable. Normocephalic/atraumatic.  PERRL. Sclera are nonicteric. Neck is supple. No masses. No JVD.  Lungs are clear. Cardiac exam shows an irregular rhythm. Rate  is ok. Heart tones quite distant. Abdomen is soft. Extremities are quite full with 3+ edema.  Chronic stasis changes  Gait and ROM are intact.  He had significant dyspnea with walking back to the exam room   No gross neurologic deficits noted.   Wt Readings from Last 3 Encounters:  08/27/15 434 lb 12.8 oz (197.224 kg)  05/20/15 427 lb (193.686 kg)  04/09/15 433 lb 4.8 oz (196.544 kg)     LABORATORY DATA: EKG today with AF with rate of 106.    Lab Results  Component Value Date   WBC 6.5 04/07/2015   HGB 14.4 04/07/2015   HCT 43.4 04/07/2015   PLT 204 04/07/2015   GLUCOSE 120* 04/09/2015   CHOL  02/11/2010    159        ATP III CLASSIFICATION:  <200     mg/dL   Desirable  200-239  mg/dL   Borderline High  >=240    mg/dL   High          TRIG 58 02/11/2010   HDL 59 02/11/2010   LDLCALC  02/11/2010    88        Total Cholesterol/HDL:CHD Risk Coronary Heart Disease Risk Table                     Men   Women  1/2 Average Risk   3.4   3.3  Average Risk       5.0   4.4  2 X Average Risk   9.6   7.1  3 X Average Risk  23.4   11.0        Use the calculated Patient Ratio above and the CHD Risk Table to determine the patient's CHD Risk.        ATP III CLASSIFICATION (LDL):  <100     mg/dL   Optimal  100-129  mg/dL   Near or Above                    Optimal  130-159  mg/dL   Borderline  160-189  mg/dL   High  >190     mg/dL   Very High   ALT 34 01/30/2015   AST 30 01/30/2015     NA 139 04/09/2015   K 3.5 04/09/2015   CL 104 04/09/2015   CREATININE 1.02 04/09/2015   BUN 13 04/09/2015   CO2 28 04/09/2015   TSH 2.421 09/27/2013   PSA 0.22 Test Methodology: Hybritech PSA 02/11/2010   INR 1.04 09/27/2013   HGBA1C 8.6* 11/07/2014   MICROALBUR 0.99 02/11/2010     Echo Study Conclusions from March 2015  - Procedure narrative: Transthoracic echocardiography. The study was technically difficult, as a result of poor acoustic windows, poor sound wave transmission, and body habitus. Intravenous contrast (Definity) was administered. - Left ventricle: The cavity size was normal. There was moderate concentric hypertrophy. Systolic function was mildly reduced. The estimated ejection fraction was in the range of 45% to 50%. Definity contrast was administered which did not aid in wall motion or EF determination. The study is not technically sufficient to allow evaluation of LV diastolic function. - Mitral valve: Poorly visualized. Mild regurgitation. - Left atrium: The atrium was mildly dilated. - Inferior vena cava: The vessel was dilated; the respirophasic diameter changes were blunted (< 50%); findings are consistent with elevated central venous pressure. - Pericardium, extracardiac: There was no pericardial effusion.   Assessment / Plan:  1. Combined  systolic and diastolic congestive heart failure -   He seems to be stable .   2. Paroxysmal atrial fibrillation -   Is maintaining NSR   3. Essential hypertension - BP is well controlled   4. Morbid obesity -  Encouraged weight loss .  This is probably the most important / limiting issues that he has presently   5. Diabetes mellitus    Nahser, Wonda Cheng, MD  08/27/2015 11:22 AM    [facility] [place],  [place], [place]  [address] Pager [phone] Phone: [phone]; Fax: [phone]   [facility]  [address] [place], [place]   [address] [phone]    Fax [phone]

## 2015-08-27 NOTE — Patient Instructions (Addendum)
Medication Instructions:  Your physician recommends that you continue on your current medications as directed. Please refer to the Current Medication list given to you today.   Labwork: None Ordered   Testing/Procedures: None Ordered   Follow-Up: Your physician wants you to follow-up in: 6 months with Dr. Elease Hashimoto.  You will receive a reminder letter in the mail two months in advance. If you don't receive a letter, please call our office to schedule the follow-up appointment.  Primary Care Offices: [facility]  Medicine  [address] [place] [phone] [place] [phone] [place] [phone]   If you need a refill on your cardiac medications before your next appointment, please call your pharmacy.   Thank you for choosing [facility]! Eligha Bridegroom, RN [phone]

## 2015-09-02 DIAGNOSIS — M545 Low back pain, unspecified: Secondary | ICD-10-CM | POA: Insufficient documentation

## 2015-09-02 DIAGNOSIS — G8929 Other chronic pain: Secondary | ICD-10-CM | POA: Insufficient documentation

## 2015-10-01 ENCOUNTER — Inpatient Hospital Stay (HOSPITAL_COMMUNITY): Payer: Medicare Other

## 2015-10-01 ENCOUNTER — Encounter (HOSPITAL_COMMUNITY): Payer: Self-pay | Admitting: Vascular Surgery

## 2015-10-01 ENCOUNTER — Inpatient Hospital Stay (HOSPITAL_COMMUNITY)
Admission: EM | Admit: 2015-10-01 | Discharge: 2015-10-04 | DRG: 871 | Disposition: A | Discharge status: Home / Self Care | Payer: Medicare Other | Attending: Internal Medicine | Admitting: Internal Medicine

## 2015-10-01 ENCOUNTER — Emergency Department (HOSPITAL_COMMUNITY): Payer: Medicare Other

## 2015-10-01 DIAGNOSIS — E1122 Type 2 diabetes mellitus with diabetic chronic kidney disease: Secondary | ICD-10-CM | POA: Diagnosis present

## 2015-10-01 DIAGNOSIS — M549 Dorsalgia, unspecified: Secondary | ICD-10-CM

## 2015-10-01 DIAGNOSIS — N179 Acute kidney failure, unspecified: Secondary | ICD-10-CM | POA: Diagnosis present

## 2015-10-01 DIAGNOSIS — A419 Sepsis, unspecified organism: Secondary | ICD-10-CM | POA: Diagnosis not present

## 2015-10-01 DIAGNOSIS — J189 Pneumonia, unspecified organism: Secondary | ICD-10-CM | POA: Diagnosis present

## 2015-10-01 DIAGNOSIS — Z7901 Long term (current) use of anticoagulants: Secondary | ICD-10-CM

## 2015-10-01 DIAGNOSIS — E118 Type 2 diabetes mellitus with unspecified complications: Secondary | ICD-10-CM | POA: Diagnosis not present

## 2015-10-01 DIAGNOSIS — I129 Hypertensive chronic kidney disease with stage 1 through stage 4 chronic kidney disease, or unspecified chronic kidney disease: Secondary | ICD-10-CM | POA: Diagnosis present

## 2015-10-01 DIAGNOSIS — I5042 Chronic combined systolic (congestive) and diastolic (congestive) heart failure: Secondary | ICD-10-CM | POA: Diagnosis present

## 2015-10-01 DIAGNOSIS — N182 Chronic kidney disease, stage 2 (mild): Secondary | ICD-10-CM | POA: Diagnosis present

## 2015-10-01 DIAGNOSIS — E1165 Type 2 diabetes mellitus with hyperglycemia: Secondary | ICD-10-CM | POA: Diagnosis present

## 2015-10-01 DIAGNOSIS — M79671 Pain in right foot: Secondary | ICD-10-CM | POA: Diagnosis present

## 2015-10-01 DIAGNOSIS — K2960 Other gastritis without bleeding: Secondary | ICD-10-CM | POA: Diagnosis present

## 2015-10-01 DIAGNOSIS — I4891 Unspecified atrial fibrillation: Secondary | ICD-10-CM | POA: Diagnosis present

## 2015-10-01 DIAGNOSIS — R17 Unspecified jaundice: Secondary | ICD-10-CM | POA: Diagnosis present

## 2015-10-01 DIAGNOSIS — K920 Hematemesis: Secondary | ICD-10-CM | POA: Diagnosis not present

## 2015-10-01 DIAGNOSIS — K226 Gastro-esophageal laceration-hemorrhage syndrome: Secondary | ICD-10-CM | POA: Diagnosis present

## 2015-10-01 DIAGNOSIS — IMO0002 Reserved for concepts with insufficient information to code with codable children: Secondary | ICD-10-CM | POA: Diagnosis present

## 2015-10-01 DIAGNOSIS — N189 Chronic kidney disease, unspecified: Secondary | ICD-10-CM

## 2015-10-01 DIAGNOSIS — E860 Dehydration: Secondary | ICD-10-CM | POA: Diagnosis present

## 2015-10-01 DIAGNOSIS — Z6841 Body Mass Index (BMI) 40.0 and over, adult: Secondary | ICD-10-CM

## 2015-10-01 DIAGNOSIS — I10 Essential (primary) hypertension: Secondary | ICD-10-CM | POA: Diagnosis present

## 2015-10-01 DIAGNOSIS — G8929 Other chronic pain: Secondary | ICD-10-CM | POA: Diagnosis present

## 2015-10-01 DIAGNOSIS — E119 Type 2 diabetes mellitus without complications: Secondary | ICD-10-CM

## 2015-10-01 HISTORY — DX: Chronic kidney disease, stage 2 (mild): N18.2

## 2015-10-01 HISTORY — DX: Unspecified atrial fibrillation: I48.91

## 2015-10-01 LAB — COMPREHENSIVE METABOLIC PANEL
ALT: 11 U/L — ABNORMAL LOW (ref 17–63)
AST: 18 U/L (ref 15–41)
Albumin: 2.8 g/dL — ABNORMAL LOW (ref 3.5–5.0)
Alkaline Phosphatase: 56 U/L (ref 38–126)
Anion gap: 10 (ref 5–15)
BUN: 12 mg/dL (ref 6–20)
CO2: 23 mmol/L (ref 22–32)
Calcium: 8.7 mg/dL — ABNORMAL LOW (ref 8.9–10.3)
Chloride: 104 mmol/L (ref 101–111)
Creatinine, Ser: 1.4 mg/dL — ABNORMAL HIGH (ref 0.61–1.24)
GFR calc Af Amer: >60 mL/min (ref >60)
GFR calc non Af Amer: 57 mL/min — ABNORMAL LOW (ref >60)
Glucose, Bld: 287 mg/dL — ABNORMAL HIGH (ref 65–99)
Potassium: 4.6 mmol/L (ref 3.5–5.1)
Sodium: 137 mmol/L (ref 135–145)
Total Bilirubin: 2.5 mg/dL — ABNORMAL HIGH (ref 0.3–1.2)
Total Protein: 7.3 g/dL (ref 6.5–8.1)

## 2015-10-01 LAB — URINALYSIS, ROUTINE W REFLEX MICROSCOPIC
Bilirubin Urine: NEGATIVE
Glucose, UA: >1000 mg/dL — AB
Hgb urine dipstick: NEGATIVE
Ketones, ur: NEGATIVE mg/dL
Leukocytes, UA: NEGATIVE
Nitrite: NEGATIVE
Protein, ur: 30 mg/dL — AB
Specific Gravity, Urine: 1.027 (ref 1.005–1.030)
pH: 5.5 (ref 5.0–8.0)

## 2015-10-01 LAB — CBC
HCT: 41 % (ref 39.0–52.0)
Hemoglobin: 13.6 g/dL (ref 13.0–17.0)
MCH: 31.7 pg (ref 26.0–34.0)
MCHC: 33.2 g/dL (ref 30.0–36.0)
MCV: 95.6 fL (ref 78.0–100.0)
Platelets: 214 10*3/uL (ref 150–400)
RBC: 4.29 MIL/uL (ref 4.22–5.81)
RDW: 13.7 % (ref 11.5–15.5)
WBC: 19.1 10*3/uL — ABNORMAL HIGH (ref 4.0–10.5)

## 2015-10-01 LAB — URINE MICROSCOPIC-ADD ON: RBC / HPF: NONE SEEN RBC/hpf (ref 0–5)

## 2015-10-01 LAB — I-STAT CG4 LACTIC ACID, ED: Lactic Acid, Venous: 1.54 mmol/L (ref 0.5–2.0)

## 2015-10-01 LAB — POC OCCULT BLOOD, ED: Fecal Occult Bld: NEGATIVE

## 2015-10-01 LAB — LIPASE, BLOOD: Lipase: 20 U/L (ref 11–51)

## 2015-10-01 MED ORDER — ACETAMINOPHEN 325 MG PO TABS
650.0000 mg | ORAL_TABLET | Freq: Once | ORAL | Status: DC
  Filled 2015-10-01: qty 2

## 2015-10-01 MED ORDER — CARVEDILOL 6.25 MG PO TABS
6.2500 mg | ORAL_TABLET | Freq: Two times a day (BID) | ORAL | Status: DC
  Administered 2015-10-02 – 2015-10-04 (×5): 6.25 mg via ORAL
  Filled 2015-10-01 (×5): qty 1

## 2015-10-01 MED ORDER — ALUM & MAG HYDROXIDE-SIMETH 200-200-20 MG/5ML PO SUSP
15.0000 mL | Freq: Once | ORAL | Status: AC
  Administered 2015-10-01: 15 mL via ORAL
  Filled 2015-10-01: qty 30

## 2015-10-01 MED ORDER — DEXTROSE 5 % IV SOLN: 1.0000 g | INTRAVENOUS | Status: DC

## 2015-10-01 MED ORDER — CEFTRIAXONE SODIUM 1 G IJ SOLR
1.0000 g | INTRAMUSCULAR | Status: DC
Start: 2015-10-01 — End: 2015-10-03
  Administered 2015-10-02: 1 g via INTRAVENOUS
  Filled 2015-10-01 (×2): qty 10

## 2015-10-01 MED ORDER — SODIUM CHLORIDE 0.9 % IV SOLN: INTRAVENOUS | Status: DC

## 2015-10-01 MED ORDER — DEXTROSE 5 % IV SOLN
500.0000 mg | Freq: Once | INTRAVENOUS | Status: AC
  Administered 2015-10-01: 500 mg via INTRAVENOUS
  Filled 2015-10-01: qty 500

## 2015-10-01 MED ORDER — SUCRALFATE 1 G PO TABS
1.0000 g | ORAL_TABLET | Freq: Three times a day (TID) | ORAL | Status: DC
  Administered 2015-10-02 – 2015-10-04 (×10): 1 g via ORAL
  Filled 2015-10-01 (×10): qty 1

## 2015-10-01 MED ORDER — NITROGLYCERIN 0.4 MG SL SUBL: 0.4000 mg | SUBLINGUAL_TABLET | SUBLINGUAL | Status: DC | PRN

## 2015-10-01 MED ORDER — LEVALBUTEROL HCL 1.25 MG/0.5ML IN NEBU
1.2500 mg | INHALATION_SOLUTION | Freq: Four times a day (QID) | RESPIRATORY_TRACT | Status: DC
  Administered 2015-10-02 (×2): 1.25 mg via RESPIRATORY_TRACT
  Filled 2015-10-01 (×3): qty 0.5

## 2015-10-01 MED ORDER — SODIUM CHLORIDE 0.9 % IV SOLN
Freq: Once | INTRAVENOUS | Status: AC
  Administered 2015-10-01: 18:00:00 via INTRAVENOUS

## 2015-10-01 MED ORDER — IOHEXOL 300 MG/ML  SOLN
100.0000 mL | Freq: Once | INTRAMUSCULAR | Status: AC | PRN
  Administered 2015-10-01: 100 mL via INTRAVENOUS

## 2015-10-01 MED ORDER — DEXTROSE 5 % IV SOLN
1.0000 g | Freq: Once | INTRAVENOUS | Status: AC
  Administered 2015-10-01: 1 g via INTRAVENOUS
  Filled 2015-10-01: qty 10

## 2015-10-01 MED ORDER — INSULIN ASPART 100 UNIT/ML ~~LOC~~ SOLN
0.0000 [IU] | Freq: Three times a day (TID) | SUBCUTANEOUS | Status: DC
  Administered 2015-10-02 (×2): 2 [IU] via SUBCUTANEOUS
  Administered 2015-10-02: 1 [IU] via SUBCUTANEOUS
  Administered 2015-10-03 (×2): 2 [IU] via SUBCUTANEOUS
  Administered 2015-10-03 – 2015-10-04 (×2): 3 [IU] via SUBCUTANEOUS
  Administered 2015-10-04: 2 [IU] via SUBCUTANEOUS

## 2015-10-01 MED ORDER — INSULIN ASPART 100 UNIT/ML ~~LOC~~ SOLN
0.0000 [IU] | Freq: Every day | SUBCUTANEOUS | Status: DC
  Administered 2015-10-02: 0 [IU] via SUBCUTANEOUS
  Administered 2015-10-03: 2 [IU] via SUBCUTANEOUS

## 2015-10-01 MED ORDER — DILTIAZEM HCL 100 MG IV SOLR
5.0000 mg/h | Freq: Once | INTRAVENOUS | Status: AC
  Administered 2015-10-02: 5 mg/h via INTRAVENOUS

## 2015-10-01 MED ORDER — OXYCODONE-ACETAMINOPHEN 5-325 MG PO TABS
1.0000 | ORAL_TABLET | Freq: Four times a day (QID) | ORAL | Status: DC | PRN
  Administered 2015-10-02 – 2015-10-04 (×6): 1 via ORAL
  Filled 2015-10-01 (×6): qty 1

## 2015-10-01 MED ORDER — AMLODIPINE BESYLATE 5 MG PO TABS
5.0000 mg | ORAL_TABLET | Freq: Every day | ORAL | Status: DC
  Administered 2015-10-02 – 2015-10-04 (×3): 5 mg via ORAL
  Filled 2015-10-01 (×3): qty 1

## 2015-10-01 MED ORDER — SODIUM CHLORIDE 0.9 % IV SOLN
INTRAVENOUS | Status: DC
  Administered 2015-10-02: 1000 mL via INTRAVENOUS
  Administered 2015-10-02: 08:00:00 via INTRAVENOUS

## 2015-10-01 MED ORDER — DM-GUAIFENESIN ER 30-600 MG PO TB12
1.0000 | ORAL_TABLET | Freq: Two times a day (BID) | ORAL | Status: DC
  Administered 2015-10-02 (×3): 1 via ORAL
  Filled 2015-10-01 (×3): qty 1

## 2015-10-01 MED ORDER — HYDRALAZINE HCL 20 MG/ML IJ SOLN: 5.0000 mg | INTRAMUSCULAR | Status: DC | PRN

## 2015-10-01 MED ORDER — DILTIAZEM HCL 100 MG IV SOLR
5.0000 mg/h | Freq: Once | INTRAVENOUS | Status: AC
  Administered 2015-10-01: 5 mg/h via INTRAVENOUS
  Filled 2015-10-01: qty 100

## 2015-10-01 MED ORDER — DEXTROSE 5 % IV SOLN
500.0000 mg | INTRAVENOUS | Status: DC
Start: 2015-10-01 — End: 2015-10-03
  Administered 2015-10-02: 500 mg via INTRAVENOUS
  Filled 2015-10-01 (×2): qty 500

## 2015-10-01 MED ORDER — PANTOPRAZOLE SODIUM 40 MG IV SOLR
40.0000 mg | Freq: Two times a day (BID) | INTRAVENOUS | Status: DC
  Administered 2015-10-02 (×3): 40 mg via INTRAVENOUS
  Filled 2015-10-01 (×3): qty 40

## 2015-10-01 MED ORDER — SODIUM CHLORIDE 0.9 % IV BOLUS (SEPSIS)
500.0000 mL | Freq: Once | INTRAVENOUS | Status: AC
  Administered 2015-10-01: 500 mL via INTRAVENOUS

## 2015-10-01 NOTE — ED Notes (Signed)
Pt reports to the ED for eval of hematemesis x 4 days. Pt reports associated abd pain as well. Denies any diarrhea or blood in his stool. Pt is on blood thinners [place]). Pt has also had decreased PO intake as well. Denies any hx of this. Pt A&Ox4, resp e/u, and skin warm and dry.

## 2015-10-01 NOTE — ED Notes (Signed)
Patient transported to X-ray 

## 2015-10-01 NOTE — H&P (Signed)
[facility] History and Physical  TEJAY HUBERT ZOX:[id number] DOB: [birth year] DOA: 10/01/2015  Referring physician: ED physician PCP: Aura Dials, MD  Specialists:   Chief Complaint:  Hematemesis, cough, shortness breath, chest pain, right foot pain  HPI: Maurice Nguyen is a 50 y.o. male with PMH of hypertension, diabetes mellitus, morbid obesity, chronic back pain, combined systolic and diastolic congestive heart failure, atrial fibrillation on Xarelto, chronic kidney disease-II, who presents with hematemesis, cough, shortness breath, chest pain and right foot pain.  Patient reports that he has been having intermittent hematemesis in the past 4 days. He vomited blood twice yesterday, but no hematemesis today. He has abdominal pain over epigastric area. No diarrhea. He also has shortness of breath, cough and chest pain for 5 days. He coughs up brownish colored sputum. He has fever and chills. He does not have tenderness over calf area. He also reports right foot pain over the plantar area. No ulcers in R foot. He was seen by podiatrist, who planned to do MRI for patient, but not done yet. Patient does not have symptoms of UTI, unilateral weakness. Patient states that he never had EGD before.  In ED, patient was found to have WBC 19.1, hemoglobin 18.4 on 04/07/15-->13.6, lipase 20, lactate 1.54, temperature 100.6, tachycardia, tachypnea, worsening renal function. CT abdomen/pelvis is negative for intra-abdominal abnormalities, but showed left lower lobe infiltration. Patient is admitted to inpatient for further evaluation and treatment.  EKG: Independently reviewed. QTC 456, atrial fibrillation with RVR  Where does patient live?   At home  Can patient participate in ADLs?  Some   Review of Systems:   General: has fevers, chills, has poor appetite, has fatigue HEENT: no blurry vision, hearing changes or sore throat Pulm: has dyspnea, coughing, no wheezing CV: has chest pain, no  palpitations Abd: has hematemesis and abdominal pain, no diarrhea, constipation GU: no dysuria, burning on urination, increased urinary frequency, hematuria  Ext: Has trace leg edema. Has right foot pain over plantar area  Neuro: no unilateral weakness, numbness, or tingling, no vision change or hearing loss Skin: no rash MSK: No muscle spasm, no deformity, no limitation of range of movement in spin Heme: No easy bruising.  Travel history: No recent long distant travel.  Allergy: No Known Allergies  Past Medical History  Diagnosis Date  . Morbid obesity (HCC)   . Lumbar herniated disc   . 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 Obesity, morbid, BMI 50 or higher (HCC) 07/13/2012  . Hypertension 07/13/2012  . Back pain, chronic 07/13/2012  . CHF (congestive heart failure) (HCC)   . Irregular heart beat   . Shortness of breath   . Anticoagulated, on Xarelto, Chads2Vas score 2 10/02/2013  . Chronic ulcer of lower extremity (HCC)     right lateral leg  . Type II diabetes mellitus (HCC) 07/13/2012  . Complication of anesthesia     " THEY LOST ME AND BROUGHT ME BACK "  . CKD (chronic kidney disease), stage II   . A-fib [facility])     Past Surgical History  Procedure Laterality Date  . Abscess drainage    . Dental surgery    . Colonoscopy  08/22/2012    Procedure: COLONOSCOPY;  Surgeon: Shirley Friar, MD;  Location: [facility];  Service: Endoscopy;  Laterality: N/A;    Social History:  reports that he has never smoked. He has never used smokeless tobacco. He reports that he does not drink alcohol or use illicit drugs.  Family History: No family history on file.   Prior to Admission medications   Medication Sig Start Date End Date Taking? Authorizing Provider  carvedilol (COREG) 6.25 MG tablet Take 1 tablet (6.25 mg total) by mouth 2 (two) times daily. 02/03/15   [name], MD  furosemide (LASIX) 20 MG tablet Take 60 mg by mouth 2 (two) times daily. 06/06/15   Historical  Provider, MD  glipiZIDE (GLUCOTROL) 10 MG tablet Take 1 tablet (10 mg total) by mouth 2 (two) times daily before a meal. 02/03/15   [name], MD  metFORMIN (GLUCOPHAGE) 1000 MG tablet Take 1 tablet (1,000 mg total) by mouth 2 (two) times daily with a meal. 02/03/15   [name], MD  nitroGLYCERIN (NITROSTAT) 0.4 MG SL tablet Place 1 tablet (0.4 mg total) under the tongue every 5 (five) minutes x 3 doses as needed for chest pain. 02/03/15   [name], MD  oxyCODONE-acetaminophen (PERCOCET/ROXICET) 5-325 MG per tablet Take 1 tablet by mouth every 6 (six) hours as needed for moderate pain. 04/09/15   Shanker Levora Dredge, MD  potassium chloride SA (K-DUR,KLOR-CON) 20 MEQ tablet Take 2 tablets (40 mEq total) by mouth daily. TAKE IN THE AM 05/20/15   Vesta Mixer, MD  rivaroxaban (XARELTO) 20 MG TABS tablet Take 1 tablet (20 mg total) by mouth daily with supper. 02/03/15   [name], MD  torsemide (DEMADEX) 20 MG tablet Take 2 tablets (40 mg total) by mouth daily. 05/20/15   Vesta Mixer, MD  valsartan (DIOVAN) 80 MG tablet Take 1 tablet (80 mg total) by mouth daily. 02/03/15   [name], MD    Physical Exam: Filed Vitals:   10/01/15 1700 10/01/15 1945 10/01/15 2124 10/01/15 2145  BP: 133/86 120/92 128/73 140/92  Pulse: 121 96 92 66  Temp:      TempSrc:      Resp: 27 36 24 32  SpO2: 97% 96% 97% 96%   General: Not in acute distress HEENT:       Eyes: PERRL, EOMI, no scleral icterus.       ENT: No discharge from the ears and nose, no pharynx injection, no tonsillar enlargement.        Neck: No JVD, no bruit, no mass felt. Heme: No neck lymph node enlargement. Cardiac: S1/S2, irregularly irregular rhythm, tachycardia. No murmurs, No gallops or rubs. Pulm:  No rales, wheezing, rhonchi or rubs. Abd: Soft, nondistended, nontender, no rebound pain, no organomegaly, BS present. Ext: has trace leg edema bilaterally. 2+DP/PT pulse bilaterally. There is tenderness over right  foot in plantar area, no ulcer. There is skin discoloration in the plantar area of right foot. Musculoskeletal: No joint deformities, No joint redness or warmth, no limitation of ROM in spin. Skin: No rashes.  Neuro: Alert, oriented X3, cranial nerves II-XII grossly intact, moves all extremities normally. Psych: Patient is not psychotic, no suicidal or hemocidal ideation.  Labs on Admission:  Basic Metabolic Panel:  Recent Labs Lab 10/01/15 1440  NA 137  K 4.6  CL 104  CO2 23  GLUCOSE 287*  BUN 12  CREATININE 1.40*  CALCIUM 8.7*   Liver Function Tests:  Recent Labs Lab 10/01/15 1440  AST 18  ALT 11*  ALKPHOS 56  BILITOT 2.5*  PROT 7.3  ALBUMIN 2.8*    Recent Labs Lab 10/01/15 1440  LIPASE 20   No results for input(s): AMMONIA in the last 168 hours. CBC:  Recent Labs Lab 10/01/15 1440  WBC 19.1*  HGB 13.6  HCT 41.0  MCV 95.6  PLT 214   Cardiac Enzymes: No results for input(s): CKTOTAL, CKMB, CKMBINDEX, TROPONINI in the last 168 hours.  BNP (last 3 results)  Recent Labs  11/07/14 1734 01/30/15 2358 04/07/15 1858  BNP 489.0* 628.7* 462.5*    ProBNP (last 3 results) No results for input(s): PROBNP in the last 8760 hours.  CBG: No results for input(s): GLUCAP in the last 168 hours.  Radiological Exams on Admission: Ct Abdomen Pelvis W Contrast  10/01/2015  CLINICAL DATA:  Abdominal pain for 4 days with nausea and vomiting, hemoptysis EXAM: CT ABDOMEN AND PELVIS WITH CONTRAST TECHNIQUE: Multidetector CT imaging of the abdomen and pelvis was performed using the standard protocol following bolus administration of intravenous contrast. CONTRAST:  OMNIPAQUE IOHEXOL 300 MG/ML  SOLN COMPARISON:  09/15/2012 FINDINGS: Lower chest: There is partially visualized opacity occupying much of the left lower lobe. The visualized portion measures 11 x 8 cm but extends off the cranial edge of the scan volume. The more anterior aspect demonstrates air bronchograms  while the more posterior aspect appears more solid. No significant pleural effusion. Hepatobiliary: The liver and gallbladder appear normal Pancreas: Pancreas is normal Spleen: Spleen is normal Adrenals/Urinary Tract: Normal adrenal glands. Right kidney is normal. 2 cm rounded focus of low attenuation midpole left kidney stable. Average attenuation is 12. This is most consistent with a cyst. There is no hydronephrosis. Bladder appears normal. Stomach/Bowel: Large bowel is normal. Appendix appears normal. Vomiting and small bowel appear normal. Vascular/Lymphatic: No evidence of aortic dilatation. No significant adenopathy. Reproductive: Negative Other: No ascites Musculoskeletal: No acute findings IMPRESSION: Extensive partially visualized airspace disease left lower lobe there are areas demonstrating air bronchograms, with the other air is or solid. This likely represents pneumonia. Consider CT thorax to fully visualize the process. Electronically Signed   By: Esperanza Heir M.D.   On: 10/01/2015 18:57    Assessment/Plan Principal Problem:   CAP (community acquired pneumonia) Active Problems:   Diabetes mellitus type 2 with complications, uncontrolled (HCC)   Essential hypertension   Obesity, morbid, BMI 50 or higher (HCC)   Back pain, chronic   Atrial fibrillation with RVR, now rate controlled   Anticoagulated, on Xarelto, Chads2Vas score 2   Type 2 diabetes mellitus (HCC)   Chronic combined systolic and diastolic heart failure (HCC)   Sepsis (HCC)   Right foot pain   Community acquired pneumonia   Acute on chronic kidney failure-II   Hematemesis   CAP and sepsis due to CAP: Patient's productive cough, shortness resonant chest pain, fever, leukocytosis plus CT abdomen findings of infiltration in left lower lobe, consistent with pneumonia. Patient is septic with leukocytosis, tachycardia, tachypnea and fever. Lactate normal. Hemodynamically stable.  - Will admit to SDU due to the need of  cardizem gtt for AFib with RVR - CXR - IV Rocephin and azithromycin - Mucinex for cough  - Xopenex Neb prn for SOB - Urine legionella and S. pneumococcal antigen - Follow up blood culture x2, sputum culture and respiratory virus panel, plus Flu pcr - will get Procalcitonin and trend lactic acid level per sepsis protocol - IVF: 500 mL of NS bolus in ED, followed by 50 mL per hour of NS (patient has a diastolic congestive heart failure, limiting aggressive IV fluids treatment)  Hematemesis: Likely due to Xarelto. He may have underlying gastric ulcers or other conditions. He never had EGD. Hemoglobin stable, 13.6 on admission. -Hold Xarelto -IV Protonix 40  mg twice a day -Carafate sarted -cbc q6h -INR/PTT/type & screen -He may need EGD as outpt if no new episode of hematemesis in hospital, otherwise need GI consultation.  Afib with RVR: HR is up to 132. CHA2DS2-VASc Score is 2, needs oral anticoagulation. Patient is Xarelto at home. -started Cardizem gtt -on Coreg  DM-II: Last A1c 8.6 on 11/07/14, poorly controled. Patient is taking metformin and glipizide at home -SSI  Hypertension: -Hold Diovan and diuretics due to worsening renal function -Start amlodipine 5 mg daily -Continue Coreg -IV hydralazine when necessary  AoCKD-II: Baseline Cre is 1.0, his Cre is 1.40 on admission. Likely due to prerenal secondary to dehydration and continuation of ARB, diruetics. No hydronephrosis by CT abdomen/pelvis - IVF as above - Check FeUrea - Follow up renal function by BMP - Avoid NSAIDs - Hold Diuretics and diovan  Chronic combined systolic and diastolic heart failure (HCC): 2-D echo on 01/31/15 showed EF 30-35 percent. Patient has a trace amount of leg edema. CHF is compensated on admission. -Hold Lasix and torsemide due to worsening renal function -Check BNP  Right foot pain: Etiology is not clear. Patient has tenderness over the plantar area of the right foot. No ulcers. -X-ray of right  foot -Pain control with Percocet when necessary   DVT ppx: SCd  Code Status: Full code Family Communication:  Yes, patient's wife at bed side Disposition Plan: Admit to inpatient   Date of Service 10/01/2015    Lorretta Harp [facility] Pager [phone]  If 7PM-7AM, please contact night-coverage [email or web address] Password [email or web address] 10/01/2015, 10:13 PM

## 2015-10-01 NOTE — ED Provider Notes (Signed)
CSN: [id number]     Arrival date & time 10/01/15  1340 History   First MD Initiated Contact with Patient 10/01/15 1624     Chief Complaint  Patient presents with  . Emesis     (Consider location/radiation/quality/duration/timing/severity/associated sxs/prior Treatment) HPI   Mr. Magallanes is a 50 year old man with a PMH atrial fibrillation (Chads-Vasc 3, on Xarelto), T2DM, Chronic lower extremity ulcerations, and a h/o of a perforated appendix (non-operative management) who presents with a 4 day history of nausea, vomiting, and abdominal pain. On the first day of symptoms, he felt nauseous and vomited small volumes "burgundy" colored emesis. He indicated that there was no bright red blood and it did not appear like coffee grounds. He has not taken Xarelto for 4 days. He had no episodes on day 2 and 3, but had a single episode on day 4. This is associated with severe upper abdominal pain that worsens with eating. He's had minimal oral intake, only able to tolerate water, small quantities of chicken soup. He's also felt feverish with chills. He's never felt symptoms like this before. His last bowel movement was yesterday and normal. He denies any diarrhea, blood in stool, or black stool. He denies chest pain but described some dyspnea due to his abdominal pain. He also complains of some localized right foot pain on his sole, for which he said his podiatrist ordered an MRI, but he has not yet received this.   Past Medical History  Diagnosis Date  . Morbid obesity ([place])   . Lumbar herniated disc   . 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 Obesity, morbid, BMI 50 or higher ([place]) 07/13/2012  . Hypertension 07/13/2012  . Back pain, chronic 07/13/2012  . CHF (congestive heart failure) ([place])   . Irregular heart beat   . Shortness of breath   . Anticoagulated, on Xarelto, Chads2Vas score 2 10/02/2013  . Chronic ulcer of lower extremity (HCC)     right lateral leg  . Type II diabetes mellitus ([place])  07/13/2012  . Complication of anesthesia     " THEY LOST ME AND BROUGHT ME BACK "   Past Surgical History  Procedure Laterality Date  . Abscess drainage    . Dental surgery    . Colonoscopy  08/22/2012    Procedure: COLONOSCOPY;  Surgeon: Lear Ng, MD;  Location: [facility];  Service: Endoscopy;  Laterality: N/A;   No family history on file. Social History  Substance Use Topics  . Smoking status: Never Smoker   . Smokeless tobacco: Never Used  . Alcohol Use: No     Comment: rarely    Review of Systems  Constitutional: Positive for fever, activity change, appetite change and fatigue. Negative for unexpected weight change.  HENT: Negative for congestion and sore throat.   Eyes: Negative for photophobia and visual disturbance.  Respiratory: Positive for shortness of breath and wheezing.   Cardiovascular: Negative for chest pain and palpitations.  Gastrointestinal: Positive for nausea, vomiting and abdominal pain. Negative for diarrhea, constipation, blood in stool and anal bleeding.  Endocrine: Negative for polydipsia and polyuria.  Genitourinary: Negative for dysuria and difficulty urinating.  Musculoskeletal: Positive for back pain. Negative for neck pain and neck stiffness.  Skin: Positive for pallor.  Neurological: Positive for light-headedness. Negative for weakness and headaches.  Psychiatric/Behavioral: Negative for dysphoric mood. The patient is not nervous/anxious.   All other systems reviewed and are negative.     Allergies  Review of patient's allergies indicates no  known allergies.  Home Medications   Prior to Admission medications   Medication Sig Start Date End Date Taking? Authorizing Provider  carvedilol (COREG) 6.25 MG tablet Take 1 tablet (6.25 mg total) by mouth 2 (two) times daily. 02/03/15   Kelvin Cellar, MD  furosemide (LASIX) 20 MG tablet Take 60 mg by mouth 2 (two) times daily. 06/06/15   Historical Provider, MD  glipiZIDE (GLUCOTROL)  10 MG tablet Take 1 tablet (10 mg total) by mouth 2 (two) times daily before a meal. 02/03/15   Kelvin Cellar, MD  metFORMIN (GLUCOPHAGE) 1000 MG tablet Take 1 tablet (1,000 mg total) by mouth 2 (two) times daily with a meal. 02/03/15   Kelvin Cellar, MD  nitroGLYCERIN (NITROSTAT) 0.4 MG SL tablet Place 1 tablet (0.4 mg total) under the tongue every 5 (five) minutes x 3 doses as needed for chest pain. 02/03/15   Kelvin Cellar, MD  oxyCODONE-acetaminophen (PERCOCET/ROXICET) 5-325 MG per tablet Take 1 tablet by mouth every 6 (six) hours as needed for moderate pain. 04/09/15   Shanker Kristeen Mans, MD  potassium chloride SA (K-DUR,KLOR-CON) 20 MEQ tablet Take 2 tablets (40 mEq total) by mouth daily. TAKE IN THE AM 05/20/15   Thayer Headings, MD  rivaroxaban (XARELTO) 20 MG TABS tablet Take 1 tablet (20 mg total) by mouth daily with supper. 02/03/15   Kelvin Cellar, MD  torsemide (DEMADEX) 20 MG tablet Take 2 tablets (40 mg total) by mouth daily. 05/20/15   Thayer Headings, MD  valsartan (DIOVAN) 80 MG tablet Take 1 tablet (80 mg total) by mouth daily. 02/03/15   Kelvin Cellar, MD   BP 128/73 mmHg  Pulse 92  Temp(Src) 99.2 F (37.3 C) (Oral)  Resp 24  SpO2 97% Physical Exam  Constitutional: He is oriented to person, place, and time. He appears well-developed and well-nourished. He appears distressed.  HENT:  Head: Normocephalic and atraumatic.  Dry mucous membranes  Eyes: Scleral icterus is present.  Neck: No JVD present.  Cardiovascular:  Irregularly irregular rhythm, no murmurs. Tachycardic.  Pulmonary/Chest: He has no wheezes. He has no rales.  Slightly tachypneic. Not using accessory muscles. Could not appreciate any egophony   Abdominal:  Exquisite epigastric tenderness. Negative Murphy's sign. No masses. No rebound. Hypoactive BS  Genitourinary: Prostate normal.  No BRB in rectal vault.  Musculoskeletal: He exhibits edema.  Lymphadenopathy:    He has no cervical adenopathy.   Neurological: He is alert and oriented to person, place, and time.  Skin: Skin is warm. He is diaphoretic. No pallor.  TTP to palpation on plantar side of right foot with dark discoloration.  Psychiatric: He has a normal mood and affect. His behavior is normal.  Nursing note and vitals reviewed.   ED Course  Procedures (including critical care time) Labs Review Labs Reviewed  COMPREHENSIVE METABOLIC PANEL - Abnormal; Notable for the following:    Glucose, Bld 287 (*)    Creatinine, Ser 1.40 (*)    Calcium 8.7 (*)    Albumin 2.8 (*)    ALT 11 (*)    Total Bilirubin 2.5 (*)    GFR calc non Af Amer 57 (*)    All other components within normal limits  CBC - Abnormal; Notable for the following:    WBC 19.1 (*)    All other components within normal limits  URINALYSIS, ROUTINE W REFLEX MICROSCOPIC (NOT AT [facility]) - Abnormal; Notable for the following:    Color, Urine AMBER (*)    APPearance  CLOUDY (*)    Glucose, UA >1000 (*)    Protein, ur 30 (*)    All other components within normal limits  URINE MICROSCOPIC-ADD ON - Abnormal; Notable for the following:    Squamous Epithelial / LPF 0-5 (*)    Bacteria, UA RARE (*)    All other components within normal limits  CULTURE, BLOOD (ROUTINE X 2)  CULTURE, BLOOD (ROUTINE X 2)  LIPASE, BLOOD  POC OCCULT BLOOD, ED  I-STAT CG4 LACTIC ACID, ED   CMP Latest Ref Rng 10/01/2015 04/09/2015 04/08/2015  Glucose 65 - 99 mg/dL 287(H) 120(H) 233(H)  BUN 6 - 20 mg/dL _0 Creatinine 0.61 - 1.24 mg/dL 1.40(H) 1.02 1.09  Sodium 135 - 145 mmol/L 137 139 138  Potassium 3.5 - 5.1 mmol/L 4.6 3.5 3.9  Chloride 101 - 111 mmol/L 104 104 103  CO2 22 - 32 mmol/L _1 Calcium 8.9 - 10.3 mg/dL 8.7(L) 8.6(L) 8.6(L)  Total Protein 6.5 - 8.1 g/dL 7.3 - -  Total Bilirubin 0.3 - 1.2 mg/dL 2.5(H) - -  Alkaline Phos 38 - 126 U/L 56 - -  AST 15 - 41 U/L 18 - -  ALT 17 - 63 U/L 11(L) - -    CBC Latest Ref Rng 10/01/2015 04/07/2015 02/02/2015  WBC 4.0 - 10.5  K/uL 19.1(H) 6.5 6.4  Hemoglobin 13.0 - 17.0 g/dL 13.6 14.4 13.9  Hematocrit 39.0 - 52.0 % 41.0 43.4 42.4  Platelets 150 - 400 K/uL 214 204 196   Filed Vitals:   10/01/15 1649 10/01/15 1700 10/01/15 1945 10/01/15 2124  BP: 120/83 133/86 120/92 128/73  Pulse: 120 121 96 92  Temp: 99.2 F (37.3 C)     TempSrc: Oral     Resp: 20 27 36 24  SpO2: 96% 97% 96% 97%     Imaging Review Ct Abdomen Pelvis W Contrast  10/01/2015  CLINICAL DATA:  Abdominal pain for 4 days with nausea and vomiting, hemoptysis EXAM: CT ABDOMEN AND PELVIS WITH CONTRAST TECHNIQUE: Multidetector CT imaging of the abdomen and pelvis was performed using the standard protocol following bolus administration of intravenous contrast. CONTRAST:  131m OMNIPAQUE IOHEXOL 300 MG/ML  SOLN COMPARISON:  09/15/2012 FINDINGS: Lower chest: There is partially visualized opacity occupying much of the left lower lobe. The visualized portion measures 11 x 8 cm but extends off the cranial edge of the scan volume. The more anterior aspect demonstrates air bronchograms while the more posterior aspect appears more solid. No significant pleural effusion. Hepatobiliary: The liver and gallbladder appear normal Pancreas: Pancreas is normal Spleen: Spleen is normal Adrenals/Urinary Tract: Normal adrenal glands. Right kidney is normal. 2 cm rounded focus of low attenuation midpole left kidney stable. Average attenuation is 12. This is most consistent with a cyst. There is no hydronephrosis. Bladder appears normal. Stomach/Bowel: Large bowel is normal. Appendix appears normal. Vomiting and small bowel appear normal. Vascular/Lymphatic: No evidence of aortic dilatation. No significant adenopathy. Reproductive: Negative Other: No ascites Musculoskeletal: No acute findings IMPRESSION: Extensive partially visualized airspace disease left lower lobe there are areas demonstrating air bronchograms, with the other air is or solid. This likely represents pneumonia.  Consider CT thorax to fully visualize the process. Electronically Signed   By: RSkipper ClicheM.D.   On: 10/01/2015 18:57   I have personally reviewed and evaluated these images and lab results as part of my medical decision-making.   EKG Interpretation   Date/Time:  Wednesday October 01 2015 14:06:21  EST Ventricular Rate:  130 PR Interval:    QRS Duration: 98 QT Interval:  310 QTC Calculation: 456 R Axis:   -63 Text Interpretation:  Atrial fibrillation with rapid ventricular response  with premature ventricular or aberrantly conducted complexes Left axis  deviation Inferior infarct , age undetermined Anterolateral infarct , age  undetermined Abnormal ECG Confirmed by Jeneen Rinks  MD, [place] ([id number]) on 10/01/2015  2:10:49 PM      MDM   Final diagnoses:  Community acquired pneumonia  Atrial fibrillation with RVR ([place])    Differential includes abdominal perforation, infectious versus non-infectious gastritis, symptomatic cholelithiasis, gastroparesis. Lipase is 20, so pancreatitis less likely. Slightly elevated temp with subjective fever and leukocytosis. He has scleral icterus on exam with elevated bilirubin (Gilbert's in setting acute illness?). LFTs and alk phos are normal, however. Patient is currently in Afib RVR with an AKI. Will give fluids and obtain CT abdomen.   FOBT negative without elevated BUN. Unlikely to be hematemesis since his vomiting of dark emesis has been since going on for four days.  LLL pneumonia seen on CT abdomen. No GI abnormalities seen. Will obtain blood cultures, lactate. Will subsequently start ceftriaxone and azithromycin for treatment of CAP.  Lactate normal, but rates continue to be in 130s in afib. Started on 5 mg/hr diltiazem. Admit to hospitalist and stepdown unit     Liberty Handy, MD 10/01/15 2148  Ripley Fraise, MD 10/02/15 [phone]

## 2015-10-01 NOTE — Progress Notes (Signed)
MEDICATION RELATED CONSULT NOTE - INITIAL   Pharmacy Consult for Antibiotic renal dosing adjustments  No Known Allergies  Patient Measurements:    Vital Signs: Temp: 99.2 F (37.3 C) (03/08 1649) Temp Source: Oral (03/08 1649) BP: 140/92 mmHg (03/08 2145) Pulse Rate: 66 (03/08 2145) Intake/Output from previous day:   Intake/Output from this shift:    Labs:  Recent Labs  10/01/15 1440  WBC 19.1*  HGB 13.6  HCT 41.0  PLT 214  CREATININE 1.40*  ALBUMIN 2.8*  PROT 7.3  AST 18  ALT 11*  ALKPHOS 56  BILITOT 2.5*   CrCl cannot be calculated (Unknown ideal weight.).   Microbiology: No results found for this or any previous visit (from the past 720 hour(s)).  Medical History: Past Medical History  Diagnosis Date  . Morbid obesity (HCC)   . Lumbar herniated disc   . 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 Obesity, morbid, BMI 50 or higher (HCC) 07/13/2012  . Hypertension 07/13/2012  . Back pain, chronic 07/13/2012  . CHF (congestive heart failure) (HCC)   . Irregular heart beat   . Shortness of breath   . Anticoagulated, on Xarelto, Chads2Vas score 2 10/02/2013  . Chronic ulcer of lower extremity (HCC)     right lateral leg  . Type II diabetes mellitus (HCC) 07/13/2012  . Complication of anesthesia     " THEY LOST ME AND BROUGHT ME BACK "  . CKD (chronic kidney disease), stage II   . A-fib [facility])      Assessment: 49 yom admitted 10/01/2015  with CAP. Pt receiving therapy with azithromycin and ceftriaxone. Pharmacy consulted for antibiotic renal dosing adjustment.  WBC 19.1, Tmax 99.2.   Plan:  Continue azithromycin 500mg  IV Q24h (7 days) Continue ceftriaxone 1g IV Q24h (7 days) F/U c/s   Maximillion Gill C. Marvis Moeller, PharmD Pharmacy Resident  Pager: [phone] 10/01/2015 10:29 PM

## 2015-10-01 NOTE — ED Provider Notes (Signed)
Patient seen/examined in the Emergency Department in conjunction with Resident Physician Provider [facility] Patient reports abdominal pain/nausea/vomiting Exam : awake/alert, moderate epigastric and LUQ tenderness to palpation.  Pt is febrile Plan: pt will need CT abd/pelvis imaging due to abd pain/vomiting/fever    Zadie Rhine, MD 10/01/15 1747

## 2015-10-01 NOTE — Progress Notes (Signed)
Called Ed to obtain report, RN requested to call back.

## 2015-10-02 ENCOUNTER — Encounter (HOSPITAL_COMMUNITY): Payer: Self-pay | Admitting: *Deleted

## 2015-10-02 DIAGNOSIS — I5042 Chronic combined systolic (congestive) and diastolic (congestive) heart failure: Secondary | ICD-10-CM

## 2015-10-02 DIAGNOSIS — K920 Hematemesis: Secondary | ICD-10-CM

## 2015-10-02 DIAGNOSIS — R110 Nausea: Secondary | ICD-10-CM

## 2015-10-02 DIAGNOSIS — E1165 Type 2 diabetes mellitus with hyperglycemia: Secondary | ICD-10-CM

## 2015-10-02 DIAGNOSIS — I4891 Unspecified atrial fibrillation: Secondary | ICD-10-CM

## 2015-10-02 DIAGNOSIS — E118 Type 2 diabetes mellitus with unspecified complications: Secondary | ICD-10-CM

## 2015-10-02 DIAGNOSIS — J189 Pneumonia, unspecified organism: Secondary | ICD-10-CM

## 2015-10-02 DIAGNOSIS — I10 Essential (primary) hypertension: Secondary | ICD-10-CM

## 2015-10-02 LAB — LACTIC ACID, PLASMA
Lactic Acid, Venous: 2 mmol/L (ref 0.5–2.0)
Lactic Acid, Venous: 1.4 mmol/L (ref 0.5–2.0)

## 2015-10-02 LAB — CBC
HCT: 43 % (ref 39.0–52.0)
HCT: 38.2 % — ABNORMAL LOW (ref 39.0–52.0)
HCT: 37.9 % — ABNORMAL LOW (ref 39.0–52.0)
HCT: 37.3 % — ABNORMAL LOW (ref 39.0–52.0)
Hemoglobin: 14.4 g/dL (ref 13.0–17.0)
Hemoglobin: 12.7 g/dL — ABNORMAL LOW (ref 13.0–17.0)
Hemoglobin: 12.7 g/dL — ABNORMAL LOW (ref 13.0–17.0)
Hemoglobin: 12.3 g/dL — ABNORMAL LOW (ref 13.0–17.0)
MCH: 32.1 pg (ref 26.0–34.0)
MCH: 31.6 pg (ref 26.0–34.0)
MCH: 31.8 pg (ref 26.0–34.0)
MCH: 30.4 pg (ref 26.0–34.0)
MCHC: 33.5 g/dL (ref 30.0–36.0)
MCHC: 33.2 g/dL (ref 30.0–36.0)
MCHC: 33.5 g/dL (ref 30.0–36.0)
MCHC: 33 g/dL (ref 30.0–36.0)
MCV: 96 fL (ref 78.0–100.0)
MCV: 95 fL (ref 78.0–100.0)
MCV: 95 fL (ref 78.0–100.0)
MCV: 92.3 fL (ref 78.0–100.0)
Platelets: 224 10*3/uL (ref 150–400)
Platelets: 198 10*3/uL (ref 150–400)
Platelets: 187 10*3/uL (ref 150–400)
Platelets: 181 10*3/uL (ref 150–400)
RBC: 4.48 MIL/uL (ref 4.22–5.81)
RBC: 4.02 MIL/uL — ABNORMAL LOW (ref 4.22–5.81)
RBC: 3.99 MIL/uL — ABNORMAL LOW (ref 4.22–5.81)
RBC: 4.04 MIL/uL — ABNORMAL LOW (ref 4.22–5.81)
RDW: 13.7 % (ref 11.5–15.5)
RDW: 13.7 % (ref 11.5–15.5)
RDW: 13.5 % (ref 11.5–15.5)
RDW: 13.4 % (ref 11.5–15.5)
WBC: 17.3 10*3/uL — ABNORMAL HIGH (ref 4.0–10.5)
WBC: 14.4 10*3/uL — ABNORMAL HIGH (ref 4.0–10.5)
WBC: 12.7 10*3/uL — ABNORMAL HIGH (ref 4.0–10.5)
WBC: 10.7 10*3/uL — ABNORMAL HIGH (ref 4.0–10.5)

## 2015-10-02 LAB — BASIC METABOLIC PANEL
Anion gap: 11 (ref 5–15)
BUN: 12 mg/dL (ref 6–20)
CO2: 23 mmol/L (ref 22–32)
Calcium: 8.5 mg/dL — ABNORMAL LOW (ref 8.9–10.3)
Chloride: 104 mmol/L (ref 101–111)
Creatinine, Ser: 1.17 mg/dL (ref 0.61–1.24)
GFR calc Af Amer: >60 mL/min (ref >60)
GFR calc non Af Amer: >60 mL/min (ref >60)
Glucose, Bld: 159 mg/dL — ABNORMAL HIGH (ref 65–99)
Potassium: 4.3 mmol/L (ref 3.5–5.1)
Sodium: 138 mmol/L (ref 135–145)

## 2015-10-02 LAB — GLUCOSE, CAPILLARY
Glucose-Capillary: 191 mg/dL — ABNORMAL HIGH (ref 65–99)
Glucose-Capillary: 139 mg/dL — ABNORMAL HIGH (ref 65–99)
Glucose-Capillary: 158 mg/dL — ABNORMAL HIGH (ref 65–99)
Glucose-Capillary: 168 mg/dL — ABNORMAL HIGH (ref 65–99)
Glucose-Capillary: 186 mg/dL — ABNORMAL HIGH (ref 65–99)

## 2015-10-02 LAB — INFLUENZA PANEL BY PCR (TYPE A & B)
H1N1 flu by pcr: NOT DETECTED
Influenza A By PCR: NEGATIVE
Influenza B By PCR: NEGATIVE

## 2015-10-02 LAB — PROTIME-INR
INR: 1.45 (ref 0.00–1.49)
Prothrombin Time: 17.7 seconds — ABNORMAL HIGH (ref 11.6–15.2)

## 2015-10-02 LAB — APTT: aPTT: 31 seconds (ref 24–37)

## 2015-10-02 LAB — TYPE AND SCREEN
ABO/RH(D): A POS
Antibody Screen: NEGATIVE

## 2015-10-02 LAB — STREP PNEUMONIAE URINARY ANTIGEN: Strep Pneumo Urinary Antigen: NEGATIVE

## 2015-10-02 LAB — PROCALCITONIN: Procalcitonin: 4.1 ng/mL

## 2015-10-02 LAB — CREATININE, URINE, RANDOM: Creatinine, Urine: 323.65 mg/dL

## 2015-10-02 LAB — BRAIN NATRIURETIC PEPTIDE: B Natriuretic Peptide: 541.2 pg/mL — ABNORMAL HIGH (ref 0.0–100.0)

## 2015-10-02 LAB — HIV ANTIBODY (ROUTINE TESTING W REFLEX): HIV Screen 4th Generation wRfx: NONREACTIVE

## 2015-10-02 LAB — MRSA PCR SCREENING: MRSA by PCR: NEGATIVE

## 2015-10-02 LAB — ABO/RH: ABO/RH(D): A POS

## 2015-10-02 MED ORDER — LEVALBUTEROL HCL 1.25 MG/0.5ML IN NEBU
1.2500 mg | INHALATION_SOLUTION | Freq: Two times a day (BID) | RESPIRATORY_TRACT | Status: DC
Start: 2015-10-02 — End: 2015-10-03
  Administered 2015-10-02 – 2015-10-03 (×2): 1.25 mg via RESPIRATORY_TRACT
  Filled 2015-10-02 (×2): qty 0.5

## 2015-10-02 MED ORDER — LEVALBUTEROL HCL 0.63 MG/3ML IN NEBU: 0.6300 mg | INHALATION_SOLUTION | Freq: Four times a day (QID) | RESPIRATORY_TRACT | Status: DC | PRN

## 2015-10-02 NOTE — Consult Note (Signed)
Referring Provider:  Barnie Del Primary Care Physician:  Aura Dials, MD Primary Gastroenterologist:  Dr. Charlott Rakes Reason for Consultation:  Minimal hematemesis  HPI: Maurice Nguyen is a 50 y.o. male seen on one occasion by Dr. Charlott Rakes about 5 years ago for diarrhea at which time colonoscopy was anticipated but never accomplished. He is referred to [place] for consultation at this time because of approximately 6 episodes of minimal hematemesis over the past several days, in association with pneumonia and a persistent cough which lead to retching or vomiting. The last time he vomited up any blood was 2 or 3 days ago.   The patient has had no dyspeptic symptomatology, no dysphagia, reflux, acid indigestion, or epigastric pain. He is not on ulcerogenic medications but is on Xarelto for atrial fibrillation.   Since admission, there has been a roughly 1 g drop in the patient's hemoglobin level, to a current level of 12.7. His BUN is normal at 12.  He is currently tolerating a clear liquid diet and would like to have his diet advanced.   Past Medical History  Diagnosis Date  . Morbid obesity (HCC)   . Lumbar herniated disc   . 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 Obesity, morbid, BMI 50 or higher (HCC) 07/13/2012  . Hypertension 07/13/2012  . Back pain, chronic 07/13/2012  . CHF (congestive heart failure) (HCC)   . Irregular heart beat   . Shortness of breath   . Anticoagulated, on Xarelto, Chads2Vas score 2 10/02/2013  . Chronic ulcer of lower extremity (HCC)     right lateral leg  . Type II diabetes mellitus (HCC) 07/13/2012  . Complication of anesthesia     " THEY LOST ME AND BROUGHT ME BACK "  . CKD (chronic kidney disease), stage II   . A-fib [facility])     Past Surgical History  Procedure Laterality Date  . Abscess drainage    . Dental surgery    . Colonoscopy  08/22/2012    Procedure: COLONOSCOPY;  Surgeon: Shirley Friar, MD;  Location: [facility] ENDOSCOPY;  Service:  Endoscopy;  Laterality: N/A;    Prior to Admission medications   Medication Sig Start Date End Date Taking? Authorizing Provider  carvedilol (COREG) 6.25 MG tablet Take 1 tablet (6.25 mg total) by mouth 2 (two) times daily. 02/03/15  Yes [name], MD  empagliflozin (JARDIANCE) 25 MG TABS tablet Take 25 mg by mouth daily. 09/16/15  Yes Historical Provider, MD  glipiZIDE (GLUCOTROL) 10 MG tablet Take 1 tablet (10 mg total) by mouth 2 (two) times daily before a meal. 02/03/15  Yes [name], MD  metFORMIN (GLUCOPHAGE) 1000 MG tablet Take 1 tablet (1,000 mg total) by mouth 2 (two) times daily with a meal. 02/03/15  Yes [name], MD  nitroGLYCERIN (NITROSTAT) 0.4 MG SL tablet Place 1 tablet (0.4 mg total) under the tongue every 5 (five) minutes x 3 doses as needed for chest pain. 02/03/15  Yes [name], MD  oxyCODONE-acetaminophen (PERCOCET/ROXICET) 5-325 MG per tablet Take 1 tablet by mouth every 6 (six) hours as needed for moderate pain. 04/09/15  Yes Shanker Levora Dredge, MD  potassium chloride SA (K-DUR,KLOR-CON) 20 MEQ tablet Take 2 tablets (40 mEq total) by mouth daily. TAKE IN THE AM 05/20/15  Yes Vesta Mixer, MD  rivaroxaban (XARELTO) 20 MG TABS tablet Take 1 tablet (20 mg total) by mouth daily with supper. 02/03/15  Yes [name], MD  torsemide (DEMADEX) 20 MG tablet Take 2 tablets (  40 mg total) by mouth daily. 05/20/15  Yes Vesta Mixer, MD  valsartan (DIOVAN) 80 MG tablet Take 1 tablet (80 mg total) by mouth daily. 02/03/15  Yes [name], MD  furosemide (LASIX) 20 MG tablet Take 60 mg by mouth 2 (two) times daily. Reported on 10/02/2015 06/06/15   Historical Provider, MD    Current Facility-Administered Medications  Medication Dose Route Frequency Provider Last Rate Last Dose  . 0.9 %  sodium chloride infusion   Intravenous Continuous Lorretta Harp, MD 50 mL/hr at 10/02/15 0804    . acetaminophen (TYLENOL) tablet 650 mg  650 mg Oral Once Zadie Rhine, MD    Stopped at 10/01/15 1736  . amLODipine (NORVASC) tablet 5 mg  5 mg Oral Daily Lorretta Harp, MD   5 mg at 10/02/15 0925  . azithromycin (ZITHROMAX) 500 mg in dextrose 5 % 250 mL IVPB  500 mg Intravenous Q24H Lorretta Harp, MD   0 mg at 10/02/15 0204  . carvedilol (COREG) tablet 6.25 mg  6.25 mg Oral BID Lorretta Harp, MD   6.25 mg at 10/02/15 0803  . cefTRIAXone (ROCEPHIN) 1 g in dextrose 5 % 50 mL IVPB  1 g Intravenous Q24H Lorretta Harp, MD   0 g at 10/02/15 0207  . dextromethorphan-guaiFENesin (MUCINEX DM) 30-600 MG per 12 hr tablet 1 tablet  1 tablet Oral BID Lorretta Harp, MD   1 tablet at 10/02/15 0925  . hydrALAZINE (APRESOLINE) injection 5 mg  5 mg Intravenous Q2H PRN Lorretta Harp, MD      . insulin aspart (novoLOG) injection 0-5 Units  0-5 Units Subcutaneous QHS Lorretta Harp, MD   0 Units at 10/02/15 0222  . insulin aspart (novoLOG) injection 0-9 Units  0-9 Units Subcutaneous TID WC Lorretta Harp, MD   2 Units at 10/02/15 1300  . levalbuterol (XOPENEX) nebulizer solution 0.63 mg  0.63 mg Nebulization Q6H PRN Osvaldo Shipper, MD      . levalbuterol Pauline Aus) nebulizer solution 1.25 mg  1.25 mg Nebulization BID Osvaldo Shipper, MD      . nitroGLYCERIN (NITROSTAT) SL tablet 0.4 mg  0.4 mg Sublingual Q5 Min x 3 PRN Lorretta Harp, MD      . oxyCODONE-acetaminophen (PERCOCET/ROXICET) 5-325 MG per tablet 1 tablet  1 tablet Oral Q6H PRN Lorretta Harp, MD   1 tablet at 10/02/15 0220  . pantoprazole (PROTONIX) injection 40 mg  40 mg Intravenous Q12H Lorretta Harp, MD   40 mg at 10/02/15 0925  . sucralfate (CARAFATE) tablet 1 g  1 g Oral TID WC & HS Lorretta Harp, MD   1 g at 10/02/15 1300    Allergies as of 10/01/2015  . (No Known Allergies)    History reviewed. No pertinent family history.  Social History   Social History  . Marital Status: Single    Spouse Name: N/A  . Number of Children: N/A  . Years of Education: N/A   Occupational History  . Not on file.   Social History Main Topics  . Smoking status: Never Smoker   .  Smokeless tobacco: Never Used  . Alcohol Use: No     Comment: rarely  . Drug Use: No  . Sexual Activity: Yes   Other Topics Concern  . Not on file   Social History Narrative    Review of Systems: See history of present illness  Physical Exam: Vital signs in last 24 hours: Temp:  [98.5 F (36.9 C)-99.2 F (37.3 C)] 98.5 F (36.9 C) (  03/09 1140) Pulse Rate:  [36-122] 86 (03/09 1140) Resp:  [13-36] 15 (03/09 1140) BP: (98-141)/(67-95) 106/67 mmHg (03/09 1140) SpO2:  [94 %-99 %] 98 % (03/09 1247) Weight:  [190.964 kg (421 lb)] 190.964 kg (421 lb) (03/09 0215) Last BM Date: 10/01/15  This is a morbidly obese African-American male, pleasant, cooperative, articulate, coherent, sitting up in the chair at the bedside in no distress. He is having a clear liquid meal at this time. He is anicteric and without overt pallor. The chest has some rhonchi on the left side, no wheezes, no rales. Heart unremarkable. Abdomen morbidly obese but nontender. Lower extremities have chronic edema consistent with his obese body habitus.   Intake/Output from previous day: 03/08 0701 - 03/09 0700 In: 114 [I.V.:114] Out: -  Intake/Output this shift: Total I/O In: 100 [I.V.:100] Out: 500 [Urine:500]  Lab Results:  Recent Labs  10/02/15 0140 10/02/15 0955 10/02/15 1612  WBC 17.3* 14.4* 12.7*  HGB 14.4 12.7* 12.7*  HCT 43.0 38.2* 37.9*  PLT 224 198 187   BMET  Recent Labs  10/01/15 1440 10/02/15 0955  NA 137 138  K 4.6 4.3  CL 104 104  CO2 23 23  GLUCOSE 287* 159*  BUN 12 12  CREATININE 1.40* 1.17  CALCIUM 8.7* 8.5*   LFT  Recent Labs  10/01/15 1440  PROT 7.3  ALBUMIN 2.8*  AST 18  ALT 11*  ALKPHOS 56  BILITOT 2.5*   PT/INR  Recent Labs  10/02/15 0140  LABPROT 17.7*  INR 1.45    Studies/Results: Dg Chest 2 View  10/01/2015  CLINICAL DATA:  Midsternal chest pain and shortness of breath. CT earlier this day demonstrating left lower lobe consolidation. EXAM: CHEST   2 VIEW COMPARISON:  CT abdomen/ pelvis earlier this day. Radiograph 04/07/2015 FINDINGS: Left lung base opacity, throughout the left lower lobe. Right lung is clear. Cardiomegaly and vascular congestion, unchanged from prior exam. Detailed evaluation limited by soft tissue attenuation from large body habitus. IMPRESSION: Left lower lobe opacity, concerning for pneumonia. Followup PA and lateral chest X-ray is recommended in 3-4 weeks following trial of antibiotic therapy to ensure resolution and exclude underlying malignancy. Stable cardiomegaly and vascular congestion, appears chronic. Electronically Signed   By: Rubye Oaks M.D.   On: 10/01/2015 22:41   Ct Abdomen Pelvis W Contrast  10/01/2015  CLINICAL DATA:  Abdominal pain for 4 days with nausea and vomiting, hemoptysis EXAM: CT ABDOMEN AND PELVIS WITH CONTRAST TECHNIQUE: Multidetector CT imaging of the abdomen and pelvis was performed using the standard protocol following bolus administration of intravenous contrast. CONTRAST:  OMNIPAQUE IOHEXOL 300 MG/ML  SOLN COMPARISON:  09/15/2012 FINDINGS: Lower chest: There is partially visualized opacity occupying much of the left lower lobe. The visualized portion measures 11 x 8 cm but extends off the cranial edge of the scan volume. The more anterior aspect demonstrates air bronchograms while the more posterior aspect appears more solid. No significant pleural effusion. Hepatobiliary: The liver and gallbladder appear normal Pancreas: Pancreas is normal Spleen: Spleen is normal Adrenals/Urinary Tract: Normal adrenal glands. Right kidney is normal. 2 cm rounded focus of low attenuation midpole left kidney stable. Average attenuation is 12. This is most consistent with a cyst. There is no hydronephrosis. Bladder appears normal. Stomach/Bowel: Large bowel is normal. Appendix appears normal. Vomiting and small bowel appear normal. Vascular/Lymphatic: No evidence of aortic dilatation. No significant adenopathy.  Reproductive: Negative Other: No ascites Musculoskeletal: No acute findings IMPRESSION: Extensive partially visualized airspace  disease left lower lobe there are areas demonstrating air bronchograms, with the other air is or solid. This likely represents pneumonia. Consider CT thorax to fully visualize the process. Electronically Signed   By: Esperanza Heir M.D.   On: 10/01/2015 18:57   Dg Foot 2 Views Right  10/01/2015  CLINICAL DATA:  Foot infection.  Diabetes.  Rule out foreign body EXAM: RIGHT FOOT - 2 VIEW COMPARISON:  06/25/2011 FINDINGS: Negative for fracture. Negative for osteomyelitis. No foreign body identified. Arterial calcification IMPRESSION: Negative. Electronically Signed   By: Marlan Palau M.D.   On: 10/01/2015 22:41    Impression: 1. Minimal hematemesis while on Xarelto, currently resolved, without significant drop in hemoglobin 2. Community-acquired pneumonia 3. Morbid obesity 4. Candidate for colon cancer screening (over age 55, has never had screening colonoscopy)  Discussion: My best impression is that this patient probably had either a little bit of stress gastritis or a small Mallory-Weiss tear which has apparently taking care of itself.  Plan: 1. I do not feel that urgent endoscopy is needed in view of the patient's clinical stability, and especially taking into consideration the increased risk based on his morbid obesity and his current pneumonia 2. To be on the safe side, I would favor approximately one to 2 weeks of PPI therapy to allow any gastritis to heal. He is unlikely to have a peptic ulcer given the absence of exposure to ulcerogenic medications and the absence of dyspeptic symptomatology. 3. Okay to try advancing diet (I have ordered) 4. I have made the patient an appointment with his primary gastroenterologist, Dr. Charlott Rakes, from March 23 at 10:15 AM. At that time, the patient could discuss possible screening colonoscopy and, if they decide to proceed  with that examination, consideration might be given to doing endoscopy at that time to help confirm the absence of any chronic abnormality of the upper tract (doubt). 5. I will sign off at this time. However, please feel free to call me if further questions arise pertaining to this patient.   LOS: 1 day   Jazzalynn Rhudy V  10/02/2015, 5:09 PM   Pager [phone] If no answer or after 5 PM call [phone]

## 2015-10-02 NOTE — Progress Notes (Signed)
[facility] PROGRESS NOTE  Maurice Nguyen YIA:[id number] DOB: September 10, 1965 DOA: 10/01/2015  PCP: Aura Dials, MD  Brief HPI: 50 year old African-American male with a past medical history of hypertension, diabetes, morbid obesity, chronic back pain, combined systolic and diastolic CHF, atrial fibrillation on anticoagulation, chronic kidney disease stage II, presented with cough, hematemesis, shortness of breath. He was found to have atrial fibrillation with RVR. Hemoglobin was normal. He was found to have a pneumonia. He was hospitalized for further management.  Past medical history:  Past Medical History  Diagnosis Date  . Morbid obesity (HCC)   . Lumbar herniated disc   . 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 Obesity, morbid, BMI 50 or higher (HCC) 07/13/2012  . Hypertension 07/13/2012  . Back pain, chronic 07/13/2012  . CHF (congestive heart failure) (HCC)   . Irregular heart beat   . Shortness of breath   . Anticoagulated, on Xarelto, Chads2Vas score 2 10/02/2013  . Chronic ulcer of lower extremity (HCC)     right lateral leg  . Type II diabetes mellitus (HCC) 07/13/2012  . Complication of anesthesia     " THEY LOST ME AND BROUGHT ME BACK "  . CKD (chronic kidney disease), stage II   . A-fib [facility])     Consultants: [facility] gastroenterology  Procedures: None. So far  Antibiotics: Ceftriaxone and azithromycin  Subjective: Patient is a poor historian. He does feel better this morning. Not as short of breath as yesterday. He has been coughing for the past 2-3 weeks. Denies any recent fever. And then 2-3 days ago, he started vomiting with some blood in it. Denies any large quantities of blood in his emesis. Denies any chest pain currently.  Objective: Vital Signs  Filed Vitals:   10/02/15 0430 10/02/15 0800 10/02/15 0925 10/02/15 1140  BP: 98/73 141/88 109/67 106/67  Pulse: 68 93  86  Temp: 99.2 F (37.3 C) 98.8 F (37.1 C)  98.5 F (36.9 C)  TempSrc: Oral Oral   Oral  Resp: 13 20  15   Height:      Weight:      SpO2: 94% 96%  97%    Intake/Output Summary (Last 24 hours) at 10/02/15 1204 Last data filed at 10/02/15 0300  Gross per 24 hour  Intake    114 ml  Output      0 ml  Net    114 ml   Filed Weights   10/02/15 0215  Weight: 190.964 kg (421 lb)    General appearance: alert, cooperative, appears stated age and no distress Resp: Crackles present, bilateral bases, left more than right. Few scattered wheezes. No rhonchi Cardio: S1, S2 is irregularly irregular. No S3, S4. No rubs, murmurs, or bruit. No significant pedal edema. GI: soft, non-tender; bowel sounds normal; no masses,  no organomegaly Extremities: extremities normal, atraumatic, no cyanosis or edema Neurologic: Awake and alert. Oriented 3. No focal neurological deficits.  Lab Results:  Basic Metabolic Panel:  Recent Labs Lab 10/01/15 1440 10/02/15 0955  NA 137 138  K 4.6 4.3  CL 104 104  CO2 23 23  GLUCOSE 287* 159*  BUN 12 12  CREATININE 1.40* 1.17  CALCIUM 8.7* 8.5*   Liver Function Tests:  Recent Labs Lab 10/01/15 1440  AST 18  ALT 11*  ALKPHOS 56  BILITOT 2.5*  PROT 7.3  ALBUMIN 2.8*    Recent Labs Lab 10/01/15 1440  LIPASE 20   CBC:  Recent Labs Lab 10/01/15 1440 10/02/15 0140 10/02/15 0955  WBC 19.1* 17.3* 14.4*  HGB 13.6 14.4 12.7*  HCT 41.0 43.0 38.2*  MCV 95.6 96.0 95.0  PLT 214 224 198   BNP (last 3 results)  Recent Labs  01/30/15 2358 04/07/15 1858 10/02/15 0153  BNP 628.7* 462.5* 541.2*   CBG:  Recent Labs Lab 10/02/15 0107 10/02/15 0759  GLUCAP 191* 139*    Recent Results (from the past 240 hour(s))  MRSA PCR Screening     Status: None   Collection Time: 10/02/15  1:37 AM  Result Value Ref Range Status   MRSA by PCR NEGATIVE NEGATIVE Final    Comment:        The GeneXpert MRSA Assay (FDA approved for NASAL specimens only), is one component of a comprehensive MRSA colonization surveillance program. It  is not intended to diagnose MRSA infection nor to guide or monitor treatment for MRSA infections.       Studies/Results: Dg Chest 2 View  10/01/2015  CLINICAL DATA:  Midsternal chest pain and shortness of breath. CT earlier this day demonstrating left lower lobe consolidation. EXAM: CHEST  2 VIEW COMPARISON:  CT abdomen/ pelvis earlier this day. Radiograph 04/07/2015 FINDINGS: Left lung base opacity, throughout the left lower lobe. Right lung is clear. Cardiomegaly and vascular congestion, unchanged from prior exam. Detailed evaluation limited by soft tissue attenuation from large body habitus. IMPRESSION: Left lower lobe opacity, concerning for pneumonia. Followup PA and lateral chest X-ray is recommended in 3-4 weeks following trial of antibiotic therapy to ensure resolution and exclude underlying malignancy. Stable cardiomegaly and vascular congestion, appears chronic. Electronically Signed   By: Rubye Oaks M.D.   On: 10/01/2015 22:41   Ct Abdomen Pelvis W Contrast  10/01/2015  CLINICAL DATA:  Abdominal pain for 4 days with nausea and vomiting, hemoptysis EXAM: CT ABDOMEN AND PELVIS WITH CONTRAST TECHNIQUE: Multidetector CT imaging of the abdomen and pelvis was performed using the standard protocol following bolus administration of intravenous contrast. CONTRAST:  OMNIPAQUE IOHEXOL 300 MG/ML  SOLN COMPARISON:  09/15/2012 FINDINGS: Lower chest: There is partially visualized opacity occupying much of the left lower lobe. The visualized portion measures 11 x 8 cm but extends off the cranial edge of the scan volume. The more anterior aspect demonstrates air bronchograms while the more posterior aspect appears more solid. No significant pleural effusion. Hepatobiliary: The liver and gallbladder appear normal Pancreas: Pancreas is normal Spleen: Spleen is normal Adrenals/Urinary Tract: Normal adrenal glands. Right kidney is normal. 2 cm rounded focus of low attenuation midpole left kidney  stable. Average attenuation is 12. This is most consistent with a cyst. There is no hydronephrosis. Bladder appears normal. Stomach/Bowel: Large bowel is normal. Appendix appears normal. Vomiting and small bowel appear normal. Vascular/Lymphatic: No evidence of aortic dilatation. No significant adenopathy. Reproductive: Negative Other: No ascites Musculoskeletal: No acute findings IMPRESSION: Extensive partially visualized airspace disease left lower lobe there are areas demonstrating air bronchograms, with the other air is or solid. This likely represents pneumonia. Consider CT thorax to fully visualize the process. Electronically Signed   By: Esperanza Heir M.D.   On: 10/01/2015 18:57   Dg Foot 2 Views Right  10/01/2015  CLINICAL DATA:  Foot infection.  Diabetes.  Rule out foreign body EXAM: RIGHT FOOT - 2 VIEW COMPARISON:  06/25/2011 FINDINGS: Negative for fracture. Negative for osteomyelitis. No foreign body identified. Arterial calcification IMPRESSION: Negative. Electronically Signed   By: Marlan Palau M.D.   On: 10/01/2015 22:41    Medications:  Scheduled: .  acetaminophen  650 mg Oral Once  . amLODipine  5 mg Oral Daily  . azithromycin  500 mg Intravenous Q24H  . carvedilol  6.25 mg Oral BID  . cefTRIAXone (ROCEPHIN)  IV  1 g Intravenous Q24H  . dextromethorphan-guaiFENesin  1 tablet Oral BID  . insulin aspart  0-5 Units Subcutaneous QHS  . insulin aspart  0-9 Units Subcutaneous TID WC  . levalbuterol  1.25 mg Nebulization 4 times per day  . pantoprazole (PROTONIX) IV  40 mg Intravenous Q12H  . sucralfate  1 g Oral TID WC & HS   Continuous: . sodium chloride 50 mL/hr at 10/02/15 0804   GNF:AOZHYQMVHQI, nitroGLYCERIN, oxyCODONE-acetaminophen  Assessment/Plan:  Principal Problem:   CAP (community acquired pneumonia) Active Problems:   Diabetes mellitus type 2 with complications, uncontrolled (HCC)   Essential hypertension   Obesity, morbid, BMI 50 or higher (HCC)   Back pain,  chronic   Atrial fibrillation with RVR, now rate controlled   Anticoagulated, on Xarelto, Chads2Vas score 2   Type 2 diabetes mellitus (HCC)   Chronic combined systolic and diastolic heart failure (HCC)   Sepsis (HCC)   Right foot pain   Acute on chronic kidney failure-II   Hematemesis    CAP and sepsis due to CAP Patient appears to be stable. Continue ceftriaxone and azithromycin for now. Mucinex. Nebulizers as needed. Follow-up on blood cultures. Influenza PCR was negative. Urine for strep and legionella antigens pending. Lactic acid levels are normal. Pro-calcitonin was 4.1. WBC is noted to be elevated but better this morning.   Hematemesis Patient is a poor historian. He cannot be very specific regarding this problem. It doesn't appear that he has had large quantity hematemesis. Hemoglobin has been stable. However, patient is on full anticoagulation for atrial fibrillation stroke prevention. So, I have discussed with Dr. Matthias Hughs with gastroenterology. He will consult on this patient. Continue to hold anticoagulation for now. Continue PPI.  Afib with RVR Patient's heart rate was up to the 130s. He was placed on a Cardizem infusion. Heart rate is improved. Continue to monitor for now. Transition to oral when he is able to take orally. CHA2DS2-VASc Score is 2, needs oral anticoagulation. Patient is Xarelto at home, which has been held due to the above. Continue Coreg.  DM-II Last A1c 8.6 on 11/07/14, poorly controled. Patient is taking metformin and glipizide at home. Continue SSI alone for now.  Essential Hypertension Holding Diovan and diuretics due to worsening renal function. Creatinine is stable this morning. Continue Coreg. May resume his home medications in a day or 2. IV hydralazine when necessary  Acute on CKD-II Baseline Cre is 1.0, his Cre is 1.40 on admission. Likely due to prerenal secondary to dehydration and continuation of ARB, diruetics. No hydronephrosis by CT  abdomen/pelvis. Creatinine has improved. Continue to monitor.  Chronic combined systolic and diastolic heart failure 2-D echo on 01/31/15 showed EF 30-35 percent. Patient has a trace amount of leg edema. CHF is compensated on admission. Holding Lasix and torsemide due to worsening renal function. Consider resuming in the next 24-48 hrs.  Right foot pain Etiology is not clear. Patient has tenderness over the plantar area of the right foot. No ulcers or wounds. X-ray did not show any acute process. Patient is followed by podiatry as an outpatient. This can be further pursued in that setting.   DVT Prophylaxis: On full anticoagulation at home. Being held for now.    Code Status: Full code  Family Communication: Discussed  with the patient and his wife  Disposition Plan: Continue in stepdown for now. Await GI input.    LOS: 1 day   [facility]  [facility] Pager [phone] 10/02/2015, 12:04 PM  If 7PM-7AM, please contact night-coverage at [email or web address], password [facility]

## 2015-10-02 NOTE — Progress Notes (Signed)
Please consider this an addendum to my consultation note from earlier:  Concerning the question of anticoagulation, I feel it would be okay to restart anticoagulation tomorrow and observe the patient for a day or so. However, I'm quite sure that his bleeding problem is over.  Florencia Reasons, M.D. Pager [phone] If no answer or after 5 PM call [phone]

## 2015-10-02 NOTE — Progress Notes (Signed)
Patient has not urinated since admission to unit. Patient says he only feel the urge to void when standing and will try to stand in the morning.

## 2015-10-02 NOTE — Evaluation (Signed)
Occupational Therapy Evaluation Patient Details Name: Maurice Nguyen MRN: [id number] DOB: 07/05/1966 Today's Date: 10/02/2015    History of Present Illness Maurice Nguyen is a 50 y.o. male with PMH of hypertension, diabetes mellitus, morbid obesity, chronic back pain, combined systolic and diastolic congestive heart failure, atrial fibrillation on Xarelto, chronic kidney disease-II, who presents with hematemesis, cough, shortness breath, chest pain and right foot pain.   Clinical Impression   Pt was assisted for LB bathing and dressing and IADL prior to admission by his wife. He walked without a device. He presents with generalized weakness, impaired standing balance, back pain and decreased activity tolerance. Educated pt in use of adaptive equipment for LB ADL and began instructing in energy conservation. Will follow acutely. Do not anticipate pt will need post acute OT.    Follow Up Recommendations  No OT follow up    Equipment Recommendations       Recommendations for Other Services       Precautions / Restrictions Precautions Precautions: Fall Restrictions Weight Bearing Restrictions: No      Mobility Bed Mobility Overal bed mobility: Needs Assistance Bed Mobility: Supine to Sit;Sit to Supine     Supine to sit: Supervision;HOB elevated Sit to supine: Supervision   General bed mobility comments: Supervision for safety with HOB elevated to 45 degrees.   Transfers Overall transfer level: Needs assistance Equipment used: None Transfers: Sit to/from Stand Sit to Stand: Min guard;From elevated surface         General transfer comment: Min Guard for safety    Balance Overall balance assessment: Needs assistance Sitting-balance support: Feet supported;No upper extremity supported Sitting balance-Leahy Scale: Good     Standing balance support: No upper extremity supported Standing balance-Leahy Scale: Fair Standing balance comment: Static balance is good,  dynamic balance is fair.                             ADL Overall ADL's : Needs assistance/impaired     Grooming: Wash/dry hands;Standing;Min guard   Upper Body Bathing: Sitting;Minimal assitance   Lower Body Bathing: Minimal assistance;Sit to/from stand   Upper Body Dressing : Set up;Sitting   Lower Body Dressing: Sit to/from stand;Moderate assistance   Toilet Transfer: Minimal assistance;Ambulation;RW           Functional mobility during ADLs: Minimal assistance General ADL Comments: Educated pt in use of AE for LB ADL. Began instruction in energy conservation strategies. Pt not feeling well and minimally receptive this date.     Vision     Perception     Praxis      Pertinent Vitals/Pain Pain Assessment: Faces Pain Score: 9  Faces Pain Scale: Hurts whole lot Pain Location: back Pain Descriptors / Indicators: Aching Pain Intervention(s): Repositioned;Monitored during session     Hand Dominance Right   Extremity/Trunk Assessment Upper Extremity Assessment Upper Extremity Assessment: Overall WFL for tasks assessed   Lower Extremity Assessment Lower Extremity Assessment: Defer to PT evaluation   Cervical / Trunk Assessment Cervical / Trunk Assessment: Normal   Communication Communication Communication: No difficulties   Cognition Arousal/Alertness: Awake/alert Behavior During Therapy: WFL for tasks assessed/performed Overall Cognitive Status: Within Functional Limits for tasks assessed                     General Comments       Exercises       Shoulder Instructions  Home Living Family/patient expects to be discharged to:: Private residence Living Arrangements: Spouse/significant other Available Help at Discharge: Family;Available 24 hours/day Type of Home: House Home Access: Stairs to enter [employer] of Steps: 3 Entrance Stairs-Rails: None Home Layout: One level     Bathroom Shower/Tub: Multimedia programmer: Standard     Home Equipment: None          Prior Functioning/Environment Level of Independence: Needs assistance    ADL's / Homemaking Assistance Needed: assisted for LB ADL, wife does cooking and cleaning        OT Diagnosis: Generalized weakness   OT Problem List: Decreased activity tolerance;Impaired balance (sitting and/or standing);Decreased knowledge of use of DME or AE;Cardiopulmonary status limiting activity;Obesity   OT Treatment/Interventions: Self-care/ADL training;Energy conservation;DME and/or AE instruction;Balance training;Patient/family education    OT Goals(Current goals can be found in the care plan section) Acute Rehab OT Goals Patient Stated Goal: To go home.  OT Goal Formulation: With patient Time For Goal Achievement: 10/09/15 Potential to Achieve Goals: Good ADL Goals Pt Will Perform Grooming: with modified independence;standing Pt Will Transfer to Toilet: with modified independence;ambulating;regular height toilet Pt Will Perform Toileting - Clothing Manipulation and hygiene: with modified independence;sit to/from stand Pt Will Perform Tub/Shower Transfer: Tub transfer;with supervision;ambulating Additional ADL Goal #1: Pt will be knowledgeable in use of AE for LB ADL. Additional ADL Goal #2: Pt will generalize energy conservation strategies in ADL independently.  OT Frequency: Min 2X/week   Barriers to D/C:            Co-evaluation              End of Session    Activity Tolerance: Patient limited by fatigue Patient left: in bed;with call bell/phone within reach;with family/visitor present   Time: 1610-9604 OT Time Calculation (min): 23 min Charges:  OT General Charges $OT Visit: 1 Procedure OT Evaluation $OT Eval Moderate Complexity: 1 Procedure OT Treatments $Self Care/Home Management : 8-22 mins G-Codes:    Evern Bio 10/02/2015, 12:08 PM  [phone]

## 2015-10-02 NOTE — Progress Notes (Signed)
Patient is very anxious about his condition stating, "I never been this sick before. I feel awful. Can you tell me why I am in a ICU bed". Patient was reassured the staff will do everything possible to help him feel better. Patient and spouse was educated on the difference of step down vs ICU unit.

## 2015-10-02 NOTE — Progress Notes (Signed)
Sputum collection container and urinal at bedside. Patient educated on the use and to notify RN when sample is coughed up or when urinated in urinal. Patient verbalized understanding.

## 2015-10-02 NOTE — Evaluation (Signed)
Physical Therapy Evaluation Patient Details Name: Maurice Nguyen MRN: [id number] DOB: 07-16-1966 Today's Date: 10/02/2015   History of Present Illness  Maurice Nguyen is a 50 y.o. male with PMH of hypertension, diabetes mellitus, morbid obesity, chronic back pain, combined systolic and diastolic congestive heart failure, atrial fibrillation on Xarelto, chronic kidney disease-II, who presents with hematemesis, cough, shortness breath, chest pain and right foot pain.  Clinical Impression  Pt admitted with above diagnosis. Pt currently with functional limitations due to the deficits listed below (see PT Problem List). Pt able to walk in hallway without AD and MinGuard Assist for safety. Pt has mild instability with gait but mostly due to his size. Pt will benefit from skilled PT to increase their independence and safety with mobility to allow discharge back home. Pt declined HHPT because he thought he does not need it. Pt did ask about a treadmill and encouraged pt to look into a gym membership to build up his activity tolerance.      Follow Up Recommendations No PT follow up;Supervision/Assistance - 24 hour (Pt declined HHPT. )    Equipment Recommendations  None recommended by PT    Recommendations for Other Services       Precautions / Restrictions Precautions Precautions: Fall Restrictions Weight Bearing Restrictions: No      Mobility  Bed Mobility Overal bed mobility: Needs Assistance Bed Mobility: Supine to Sit     Supine to sit: Supervision;HOB elevated     General bed mobility comments: Supervision for safety with HOB elevated to 45 degrees.   Transfers Overall transfer level: Needs assistance Equipment used: None Transfers: Sit to/from Stand Sit to Stand: Min guard;From elevated surface         General transfer comment: Min Guard for safety  Ambulation/Gait Ambulation/Gait assistance: Min guard Ambulation Distance (Feet): 110 Feet Assistive device:  None Gait Pattern/deviations: Step-through pattern;Decreased stride length;Trendelenburg;Wide base of support Gait velocity: decreased Gait velocity interpretation: Below normal speed for age/gender General Gait Details: Pt has very wide BOS and very short stride length. Pt takes increased time for turns.   Stairs            Wheelchair Mobility    Modified Rankin (Stroke Patients Only)       Balance Overall balance assessment: Needs assistance Sitting-balance support: Feet supported;No upper extremity supported Sitting balance-Leahy Scale: Good     Standing balance support: No upper extremity supported Standing balance-Leahy Scale: Fair Standing balance comment: Static balance is good, dynamic balance is fair.                              Pertinent Vitals/Pain Pain Assessment: 0-10 Pain Score: 9  Pain Location: Back Pain Descriptors / Indicators: Constant;Aching Pain Intervention(s): Limited activity within patient's tolerance;Monitored during session;Repositioned  HR 122 with ambulation, SpO2 94% on room air but RR 42 with ambulation and pt c/o SOB.     Home Living Family/patient expects to be discharged to:: Private residence Living Arrangements: Spouse/significant other Available Help at Discharge: Family;Available 24 hours/day Type of Home: House Home Access: Stairs to enter Entrance Stairs-Rails: None Entrance Stairs-Number of Steps: 3 Home Layout: One level Home Equipment: None      Prior Function Level of Independence: Independent               Hand Dominance        Extremity/Trunk Assessment   Upper Extremity Assessment: Overall WFL for tasks assessed  Lower Extremity Assessment: Generalized weakness      Cervical / Trunk Assessment: Normal  Communication   Communication: No difficulties  Cognition Arousal/Alertness: Awake/alert Behavior During Therapy: WFL for tasks assessed/performed Overall Cognitive  Status: Within Functional Limits for tasks assessed                      General Comments General comments (skin integrity, edema, etc.): Pt was agreeable to therapy. He states that he normally gets around pretty good and does not think he needs PT after discharge.     Exercises        Assessment/Plan    PT Assessment Patient needs continued PT services  PT Diagnosis Difficulty walking;Abnormality of gait;Generalized weakness   PT Problem List Decreased strength;Decreased range of motion;Decreased activity tolerance;Decreased balance;Decreased mobility;Decreased safety awareness;Obesity;Pain  PT Treatment Interventions Gait training;Stair training;Functional mobility training;Therapeutic activities;Therapeutic exercise;Balance training;Patient/family education   PT Goals (Current goals can be found in the Care Plan section) Acute Rehab PT Goals Patient Stated Goal: To go home.  PT Goal Formulation: With patient/family Time For Goal Achievement: 10/16/15 Potential to Achieve Goals: Good    Frequency Min 3X/week   Barriers to discharge        Co-evaluation               End of Session Equipment Utilized During Treatment: Gait belt Activity Tolerance: Patient tolerated treatment well Patient left: in bed;with call bell/phone within reach;with family/visitor present Nurse Communication: Mobility status         Time: 1601-0932 PT Time Calculation (min) (ACUTE ONLY): 25 min   Charges:   PT Evaluation $PT Eval Moderate Complexity: 1 Procedure PT Treatments $Therapeutic Exercise: 8-22 mins   PT G Codes:       Everlean Cherry, SPT Everlean Cherry 10/02/2015, 10:17 AM

## 2015-10-02 NOTE — Plan of Care (Signed)
Problem: Acute Rehab PT Goals(only PT should resolve) Goal: Pt Will Go Up/Down Stairs With no rails     

## 2015-10-03 DIAGNOSIS — N179 Acute kidney failure, unspecified: Secondary | ICD-10-CM

## 2015-10-03 DIAGNOSIS — N189 Chronic kidney disease, unspecified: Secondary | ICD-10-CM

## 2015-10-03 LAB — CBC
HCT: 41.1 % (ref 39.0–52.0)
Hemoglobin: 13.4 g/dL (ref 13.0–17.0)
MCH: 30.8 pg (ref 26.0–34.0)
MCHC: 32.6 g/dL (ref 30.0–36.0)
MCV: 94.5 fL (ref 78.0–100.0)
Platelets: 223 10*3/uL (ref 150–400)
RBC: 4.35 MIL/uL (ref 4.22–5.81)
RDW: 13.4 % (ref 11.5–15.5)
WBC: 9.4 10*3/uL (ref 4.0–10.5)

## 2015-10-03 LAB — GLUCOSE, CAPILLARY
Glucose-Capillary: 201 mg/dL — ABNORMAL HIGH (ref 65–99)
Glucose-Capillary: 199 mg/dL — ABNORMAL HIGH (ref 65–99)
Glucose-Capillary: 188 mg/dL — ABNORMAL HIGH (ref 65–99)
Glucose-Capillary: 219 mg/dL — ABNORMAL HIGH (ref 65–99)

## 2015-10-03 LAB — LEGIONELLA ANTIGEN, URINE

## 2015-10-03 LAB — BASIC METABOLIC PANEL
Anion gap: 11 (ref 5–15)
BUN: 17 mg/dL (ref 6–20)
CO2: 21 mmol/L — ABNORMAL LOW (ref 22–32)
Calcium: 8.5 mg/dL — ABNORMAL LOW (ref 8.9–10.3)
Chloride: 102 mmol/L (ref 101–111)
Creatinine, Ser: 1.11 mg/dL (ref 0.61–1.24)
GFR calc Af Amer: >60 mL/min (ref >60)
GFR calc non Af Amer: >60 mL/min (ref >60)
Glucose, Bld: 204 mg/dL — ABNORMAL HIGH (ref 65–99)
Potassium: 4 mmol/L (ref 3.5–5.1)
Sodium: 134 mmol/L — ABNORMAL LOW (ref 135–145)

## 2015-10-03 LAB — UREA NITROGEN, URINE: Urea Nitrogen, Ur: 1112 mg/dL

## 2015-10-03 MED ORDER — AZITHROMYCIN 500 MG PO TABS
500.0000 mg | ORAL_TABLET | Freq: Every day | ORAL | Status: DC
  Administered 2015-10-03 – 2015-10-04 (×2): 500 mg via ORAL
  Filled 2015-10-03 (×2): qty 1

## 2015-10-03 MED ORDER — CEFPODOXIME PROXETIL 200 MG PO TABS
200.0000 mg | ORAL_TABLET | Freq: Two times a day (BID) | ORAL | Status: DC
  Administered 2015-10-03 – 2015-10-04 (×3): 200 mg via ORAL
  Filled 2015-10-03 (×5): qty 1

## 2015-10-03 MED ORDER — GUAIFENESIN ER 600 MG PO TB12
1200.0000 mg | ORAL_TABLET | Freq: Two times a day (BID) | ORAL | Status: DC
  Administered 2015-10-03 – 2015-10-04 (×3): 1200 mg via ORAL
  Filled 2015-10-03 (×3): qty 2

## 2015-10-03 MED ORDER — RIVAROXABAN 20 MG PO TABS
20.0000 mg | ORAL_TABLET | Freq: Every day | ORAL | Status: DC
  Administered 2015-10-03: 20 mg via ORAL
  Filled 2015-10-03: qty 1

## 2015-10-03 MED ORDER — TORSEMIDE 20 MG PO TABS
20.0000 mg | ORAL_TABLET | Freq: Every day | ORAL | Status: DC
Start: 2015-10-03 — End: 2015-10-04
  Administered 2015-10-03 – 2015-10-04 (×2): 20 mg via ORAL
  Filled 2015-10-03 (×2): qty 1

## 2015-10-03 MED ORDER — PANTOPRAZOLE SODIUM 40 MG PO TBEC
40.0000 mg | DELAYED_RELEASE_TABLET | Freq: Two times a day (BID) | ORAL | Status: DC
  Administered 2015-10-03 – 2015-10-04 (×3): 40 mg via ORAL
  Filled 2015-10-03 (×3): qty 1

## 2015-10-03 NOTE — Discharge Instructions (Addendum)
Community-Acquired Pneumonia, Adult °Pneumonia is an infection of the lungs. There are different types of pneumonia. One type can develop while a person is in a hospital. A different type, called community-acquired pneumonia, develops in people who are not, or have not recently been, in the hospital or other health care facility.  °CAUSES °Pneumonia may be caused by bacteria, viruses, or funguses. Community-acquired pneumonia is often caused by Streptococcus pneumonia bacteria. These bacteria are often passed from one person to another by breathing in droplets from the cough or sneeze of an infected person. °RISK FACTORS °The condition is more likely to develop in: °· People who have chronic diseases, such as chronic obstructive pulmonary disease (COPD), asthma, congestive heart failure, cystic fibrosis, diabetes, or kidney disease. °· People who have early-stage or late-stage HIV. °· People who have sickle cell disease. °· People who have had their spleen removed (splenectomy). °· People who have poor dental hygiene. °· People who have medical conditions that increase the risk of breathing in (aspirating) secretions their own mouth and nose.   °· People who have a weakened immune system (immunocompromised). °· People who smoke. °· People who travel to areas where pneumonia-causing germs commonly exist. °· People who are around animal habitats or animals that have pneumonia-causing germs, including birds, bats, rabbits, cats, and farm animals. °SYMPTOMS °Symptoms of this condition include: °· A dry cough. °· A wet (productive) cough. °· Fever. °· Sweating. °· Chest pain, especially when breathing deeply or coughing. °· Rapid breathing or difficulty breathing. °· Shortness of breath. °· Shaking chills. °· Fatigue. °· Muscle aches. °DIAGNOSIS °Your health care provider will take a medical history and perform a physical exam. You may also have other tests, including: °· Imaging studies of your chest, including  X-rays. °· Tests to check your blood oxygen level and other blood gases. °· Other tests on blood, mucus (sputum), fluid around your lungs (pleural fluid), and urine. °If your pneumonia is severe, other tests may be done to identify the specific cause of your illness. °TREATMENT °The type of treatment that you receive depends on many factors, such as the cause of your pneumonia, the medicines you take, and other medical conditions that you have. For most adults, treatment and recovery from pneumonia may occur at home. In some cases, treatment must happen in a hospital. Treatment may include: °· Antibiotic medicines, if the pneumonia was caused by bacteria. °· Antiviral medicines, if the pneumonia was caused by a virus. °· Medicines that are given by mouth or through an IV tube. °· Oxygen. °· Respiratory therapy. °Although rare, treating severe pneumonia may include: °· Mechanical ventilation. This is done if you are not breathing well on your own and you cannot maintain a safe blood oxygen level. °· Thoracentesis. This procedure removes fluid around one lung or both lungs to help you breathe better. °HOME CARE INSTRUCTIONS °· Take over-the-counter and prescription medicines only as told by your health care provider. °¨ Only take cough medicine if you are losing sleep. Understand that cough medicine can prevent your body's natural ability to remove mucus from your lungs. °¨ If you were prescribed an antibiotic medicine, take it as told by your health care provider. Do not stop taking the antibiotic even if you start to feel better. °· Sleep in a semi-upright position at night. Try sleeping in a reclining chair, or place a few pillows under your head. °· Do not use tobacco products, including cigarettes, chewing tobacco, and e-cigarettes. If you need help quitting, ask your health care provider. °· Drink enough water to keep your urine   clear or pale yellow. This will help to thin out mucus secretions in your  lungs. PREVENTION There are ways that you can decrease your risk of developing community-acquired pneumonia. Consider getting a pneumococcal vaccine if:  You are older than 52 years of age.  You are older than 52 years of age and are undergoing cancer treatment, have chronic lung disease, or have other medical conditions that affect your immune system. Ask your health care provider if this applies to you. There are different types and schedules of pneumococcal vaccines. Ask your health care provider which vaccination option is best for you. You may also prevent community-acquired pneumonia if you take these actions:  Get an influenza vaccine every year. Ask your health care provider which type of influenza vaccine is best for you.  Go to the dentist on a regular basis.  Wash your hands often. Use hand sanitizer if soap and water are not available. SEEK MEDICAL CARE IF:  You have a fever.  You are losing sleep because you cannot control your cough with cough medicine. SEEK IMMEDIATE MEDICAL CARE IF:  You have worsening shortness of breath.  You have increased chest pain.  Your sickness becomes worse, especially if you are an older adult or have a weakened immune system.  You cough up blood.   This information is not intended to replace advice given to you by your health care provider. Make sure you discuss any questions you have with your health care provider.   Document Released: 07/12/2005 Document Revised: 04/02/2015 Document Reviewed: 11/06/2014 Elsevier Interactive Patient Education 2016 [employer]   Atrial Fibrillation Atrial fibrillation is a type of irregular or rapid heartbeat (arrhythmia). In atrial fibrillation, the heart quivers continuously in a chaotic pattern. This occurs when parts of the heart receive disorganized signals that make the heart unable to pump blood normally. This can increase the risk for stroke, heart failure, and other heart-related conditions.  There are different types of atrial fibrillation, including:  Paroxysmal atrial fibrillation. This type starts suddenly, and it usually stops on its own shortly after it starts.  Persistent atrial fibrillation. This type often lasts longer than a week. It may stop on its own or with treatment.  Long-lasting persistent atrial fibrillation. This type lasts longer than 12 months.  Permanent atrial fibrillation. This type does not go away. Talk with your health care provider to learn about the type of atrial fibrillation that you have. CAUSES This condition is caused by some heart-related conditions or procedures, including:  A heart attack.  Coronary artery disease.  Heart failure.  Heart valve conditions.  High blood pressure.  Inflammation of the sac that surrounds the heart (pericarditis).  Heart surgery.  Certain heart rhythm disorders, such as Wolf-Parkinson-White syndrome. Other causes include:  Pneumonia.  Obstructive sleep apnea.  Blockage of an artery in the lungs (pulmonary embolism, or PE).  Lung cancer.  Chronic lung disease.  Thyroid problems, especially if the thyroid is overactive (hyperthyroidism).  Caffeine.  Excessive alcohol use or illegal drug use.  Use of some medicines, including certain decongestants and diet pills. Sometimes, the cause cannot be found. RISK FACTORS This condition is more likely to develop in:  People who are older in age.  People who smoke.  People who have diabetes mellitus.  People who are overweight (obese).  Athletes who exercise vigorously. SYMPTOMS Symptoms of this condition include:  A feeling that your heart is beating rapidly or irregularly.  A feeling of discomfort or pain in  your chest.  Shortness of breath.  Sudden light-headedness or weakness.  Getting tired easily during exercise. In some cases, there are no symptoms. DIAGNOSIS Your health care provider may be able to detect atrial  fibrillation when taking your pulse. If detected, this condition may be diagnosed with:  An electrocardiogram (ECG).  A Holter monitor test that records your heartbeat patterns over a 24-hour period.  Transthoracic echocardiogram (TTE) to evaluate how blood flows through your heart.  Transesophageal echocardiogram (TEE) to view more detailed images of your heart.  A stress test.  Imaging tests, such as a CT scan or chest X-ray.  Blood tests. TREATMENT The main goals of treatment are to prevent blood clots from forming and to keep your heart beating at a normal rate and rhythm. The type of treatment that you receive depends on many factors, such as your underlying medical conditions and how you feel when you are experiencing atrial fibrillation. This condition may be treated with:  Medicine to slow down the heart rate, bring the heart's rhythm back to normal, or prevent clots from forming.  Electrical cardioversion. This is a procedure that resets your heart's rhythm by delivering a controlled, low-energy shock to the heart through your skin.  Different types of ablation, such as catheter ablation, catheter ablation with pacemaker, or surgical ablation. These procedures destroy the heart tissues that send abnormal signals. When the pacemaker is used, it is placed under your skin to help your heart beat in a regular rhythm. HOME CARE INSTRUCTIONS  Take over-the counter and prescription medicines only as told by your health care provider.  If your health care provider prescribed a blood-thinning medicine (anticoagulant), take it exactly as told. Taking too much blood-thinning medicine can cause bleeding. If you do not take enough blood-thinning medicine, you will not have the protection that you need against stroke and other problems.  Do not use tobacco products, including cigarettes, chewing tobacco, and e-cigarettes. If you need help quitting, ask your health care provider.  If you  have obstructive sleep apnea, manage your condition as told by your health care provider.  Do not drink alcohol.  Do not drink beverages that contain caffeine, such as coffee, soda, and tea.  Maintain a healthy weight. Do not use diet pills unless your health care provider approves. Diet pills may make heart problems worse.  Follow diet instructions as told by your health care provider.  Exercise regularly as told by your health care provider.  Keep all follow-up visits as told by your health care provider. This is important. PREVENTION  Avoid drinking beverages that contain caffeine or alcohol.  Avoid certain medicines, especially medicines that are used for breathing problems.  Avoid certain herbs and herbal medicines, such as those that contain ephedra or ginseng.  Do not use illegal drugs, such as cocaine and amphetamines.  Do not smoke.  Manage your high blood pressure. SEEK MEDICAL CARE IF:  You notice a change in the rate, rhythm, or strength of your heartbeat.  You are taking an anticoagulant and you notice increased bruising.  You tire more easily when you exercise or exert yourself. SEEK IMMEDIATE MEDICAL CARE IF:  You have chest pain, abdominal pain, sweating, or weakness.  You feel nauseous.  You notice blood in your vomit, bowel movement, or urine.  You have shortness of breath.  You suddenly have swollen feet and ankles.  You feel dizzy.  You have sudden weakness or numbness of the face, arm, or leg, especially on  one side of the body.  You have trouble speaking, trouble understanding, or both (aphasia).  Your face or your eyelid droops on one side. These symptoms may represent a serious problem that is an emergency. Do not wait to see if the symptoms will go away. Get medical help right away. Call your local emergency services (911 in the [place] Do not drive yourself to the hospital.   This information is not intended to replace advice given to you  by your health care provider. Make sure you discuss any questions you have with your health care provider.   Document Released: 07/12/2005 Document Revised: 04/02/2015 Document Reviewed: 11/06/2014 Elsevier Interactive Patient Education 2016 [employer]. ------------------------------------------------------------------------------------------------------------------------------------------------------------------ Information on my medicine - XARELTO (Rivaroxaban)  This medication education was reviewed with me or my healthcare representative as part of my discharge preparation.  The pharmacist that spoke with me during my hospital stay was:  Remi Haggard, [facility]  Why was Xarelto prescribed for you? Xarelto was prescribed for you to reduce the risk of a blood clot forming that can cause a stroke if you have a medical condition called atrial fibrillation (a type of irregular heartbeat).  What do you need to know about xarelto ? Take your Xarelto ONCE DAILY at the same time every day with your evening meal. If you have difficulty swallowing the tablet whole, you may crush it and mix in applesauce just prior to taking your dose.  Take Xarelto exactly as prescribed by your doctor and DO NOT stop taking Xarelto without talking to the doctor who prescribed the medication.  Stopping without other stroke prevention medication to take the place of Xarelto may increase your risk of developing a clot that causes a stroke.  Refill your prescription before you run out.  After discharge, you should have regular check-up appointments with your healthcare provider that is prescribing your Xarelto.  In the future your dose may need to be changed if your kidney function or weight changes by a significant amount.  What do you do if you miss a dose? If you are taking Xarelto ONCE DAILY and you miss a dose, take it as soon as you remember on the same day then continue your regularly scheduled once daily  regimen the next day. Do not take two doses of Xarelto at the same time or on the same day.   Important Safety Information A possible side effect of Xarelto is bleeding. You should call your healthcare provider right away if you experience any of the following: ? Bleeding from an injury or your nose that does not stop. ? Unusual colored urine (red or dark brown) or unusual colored stools (red or black). ? Unusual bruising for unknown reasons. ? A serious fall or if you hit your head (even if there is no bleeding).  Some medicines may interact with Xarelto and might increase your risk of bleeding while on Xarelto. To help avoid this, consult your healthcare provider or pharmacist prior to using any new prescription or non-prescription medications, including herbals, vitamins, non-steroidal anti-inflammatory drugs (NSAIDs) and supplements.  This website has more information on Xarelto: VisitDestination.com.br.  --------------------------------------------------------------------------------------------------------------------------------------------

## 2015-10-03 NOTE — Progress Notes (Signed)
Report received from [place], [place] for transfer to [phone]

## 2015-10-03 NOTE — H&P (Signed)
NURSING PROGRESS NOTE  Maurice Nguyen [id number] Transfer Data: 10/03/2015 4:57 PM Attending Provider: Osvaldo Shipper, MD CWU:[id number],IHWTU E, MD Code Status: Full  Maurice Nguyen is a 50 y.o. male patient transferred from [facility].  -No acute distress noted.  -No complaints of shortness of breath.  -No complaints of chest pain.   Cardiac Monitoring: Box # [id number] in place. Cardiac monitor yields:atrial fibrillation, with ventricular rate of 92.  Blood pressure 125/85, pulse 86, temperature 98.7 F (37.1 C), temperature source Oral, resp. rate 18, height 6\' 4"  (1.93 m), weight 190.964 kg (421 lb), SpO2 98 %.   IV Fluids:  IV in place, occlusive dsg intact without redness, IV cath antecubital right, condition patent and no redness none.   Allergies:  Review of patient's allergies indicates no known allergies.  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  Past Surgical History:   has past surgical history that includes Abscess drainage; Dental surgery; and Colonoscopy (08/22/2012).  Social History:   reports that he has never smoked. He has never used smokeless tobacco. He reports that he does not drink alcohol or use illicit drugs.  Skin: Unnaboots to bilateral lower extremities.  Patient/Family orientated to room. Information packet given to patient/family. Admission inpatient armband information verified with patient/family to include name and date of birth and placed on patient  arm. Side rails up x 2, fall assessment and education completed with patient/family. Patient/family able to verbalize understanding of risk associated with falls and verbalized understanding to call for assistance before getting out of bed. Call light within reach. Patient/family able to voice and demonstrate understanding of unit orientation instructions.    Will continue to evaluate and treat per MD orders.

## 2015-10-03 NOTE — Progress Notes (Signed)
Orthopedic Tech Progress Note Patient Details:  Maurice Nguyen 02-08-1966 [id number]  Ortho Devices Type of Ortho Device: Roland Rack boot Ortho Device/Splint Location: bilateral Ortho Device/Splint Interventions: Application   [name] 10/03/2015, 11:09 AM

## 2015-10-03 NOTE — Plan of Care (Signed)
Problem: Education: Goal: Knowledge of [facility] Education information/materials will improve Outcome: Completed/Met Date Met:  10/03/15 Pt is aware and understands [facility] policies and procedures. Pt is able to describe and rate his pain and other needs. Pt is eager in learning factors related to his disease process and what he has to do in order to get better.

## 2015-10-03 NOTE — Progress Notes (Signed)
Pt transferred to [address] per recliner by Psychologist, sport and exercise. Wife has been here with pt since admission. 2 cell phone & charger went with pt and all belongings.

## 2015-10-03 NOTE — Progress Notes (Signed)
Utilization Review Completed.  

## 2015-10-03 NOTE — Care Management Important Message (Signed)
Important Message  Patient Details  Name: Maurice Nguyen MRN: [id number] Date of Birth: Mar 03, 1966   Medicare Important Message Given:       Magdalene River, RN 10/03/2015, 1:14 PM

## 2015-10-03 NOTE — Progress Notes (Signed)
[facility] PROGRESS NOTE  Maurice Nguyen HTX:[id number] DOB: January 07, 1966 DOA: 10/01/2015  PCP: Aura Dials, MD  Brief HPI: 50 year old African-American male with a past medical history of hypertension, diabetes, morbid obesity, chronic back pain, combined systolic and diastolic CHF, atrial fibrillation on anticoagulation, chronic kidney disease stage II, presented with cough, hematemesis, shortness of breath. He was found to have atrial fibrillation with RVR. Hemoglobin was normal. He was found to have a pneumonia. He was hospitalized for further management.  Past medical history:  Past Medical History  Diagnosis Date  . Morbid obesity (HCC)   . Lumbar herniated disc   . 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 Obesity, morbid, BMI 50 or higher (HCC) 07/13/2012  . Hypertension 07/13/2012  . Back pain, chronic 07/13/2012  . CHF (congestive heart failure) (HCC)   . Irregular heart beat   . Shortness of breath   . Anticoagulated, on Xarelto, Chads2Vas score 2 10/02/2013  . Chronic ulcer of lower extremity (HCC)     right lateral leg  . Type II diabetes mellitus (HCC) 07/13/2012  . Complication of anesthesia     " THEY LOST ME AND BROUGHT ME BACK "  . CKD (chronic kidney disease), stage II   . A-fib [facility])     Consultants: [facility]  Procedures: None  Antibiotics: Ceftriaxone and azithromycin until 3/10 Vantin and azithromycin. 3/10  Subjective: Patient feels better this morning. Not as short of breath as before. Cough is improving. No further nausea or vomiting. Denies any chest pain.   Objective: Vital Signs  Filed Vitals:   10/03/15 0200 10/03/15 0300 10/03/15 0400 10/03/15 0500  BP: 119/72 123/86    Pulse: 85 87 103 91  Temp:      TempSrc:      Resp: 0 21 26 20   Height:      Weight:      SpO2: 96% 95% 95% 95%    Intake/Output Summary (Last 24 hours) at 10/03/15 0802 Last data filed at 10/03/15 0600  Gross per 24 hour  Intake   2120 ml    Output    650 ml  Net   1470 ml   Filed Weights   10/02/15 0215  Weight: 190.964 kg (421 lb)    General appearance: alert, cooperative, appears stated age and no distress Resp: Improving air entry bilaterally. Few crackles present in bilateral bases, left more than right. Less wheezing compared to yesterday. No rhonchi.  Cardio: S1, S2 is irregularly irregular. No S3, S4. No rubs, murmurs, or bruit. No significant pedal edema. GI: soft, non-tender; bowel sounds normal; no masses,  no organomegaly Extremities: extremities normal, atraumatic, no cyanosis or edema Neurologic: Awake and alert. Oriented 3. No focal neurological deficits.  Lab Results:  Basic Metabolic Panel:  Recent Labs Lab 10/01/15 1440 10/02/15 0955 10/03/15 0330  NA 137 138 134*  K 4.6 4.3 4.0  CL 104 104 102  CO2 23 23 21*  GLUCOSE 287* 159* 204*  BUN 12 12 17   CREATININE 1.40* 1.17 1.11  CALCIUM 8.7* 8.5* 8.5*   Liver Function Tests:  Recent Labs Lab 10/01/15 1440  AST 18  ALT 11*  ALKPHOS 56  BILITOT 2.5*  PROT 7.3  ALBUMIN 2.8*    Recent Labs Lab 10/01/15 1440  LIPASE 20   CBC:  Recent Labs Lab 10/02/15 0140 10/02/15 0955 10/02/15 1612 10/02/15 2204 10/03/15 0330  WBC 17.3* 14.4* 12.7* 10.7* 9.4  HGB 14.4 12.7* 12.7* 12.3* 13.4  HCT 43.0 38.2*  37.9* 37.3* 41.1  MCV 96.0 95.0 95.0 92.3 94.5  PLT 224 198 187 181 223   BNP (last 3 results)  Recent Labs  01/30/15 2358 04/07/15 1858 10/02/15 0153  BNP 628.7* 462.5* 541.2*   CBG:  Recent Labs Lab 10/02/15 0107 10/02/15 0759 10/02/15 1246 10/02/15 1659 10/02/15 2226  GLUCAP 191* 139* 158* 168* 186*    Recent Results (from the past 240 hour(s))  Blood culture (routine x 2)     Status: None (Preliminary result)   Collection Time: 10/01/15  8:41 PM  Result Value Ref Range Status   Specimen Description BLOOD RIGHT HAND  Final   Special Requests BOTTLES DRAWN AEROBIC AND ANAEROBIC 5CC  Final   Culture NO GROWTH <  24 HOURS  Final   Report Status PENDING  Incomplete  MRSA PCR Screening     Status: None   Collection Time: 10/02/15  1:37 AM  Result Value Ref Range Status   MRSA by PCR NEGATIVE NEGATIVE Final    Comment:        The GeneXpert MRSA Assay (FDA approved for NASAL specimens only), is one component of a comprehensive MRSA colonization surveillance program. It is not intended to diagnose MRSA infection nor to guide or monitor treatment for MRSA infections.       Studies/Results: Dg Chest 2 View  10/01/2015  CLINICAL DATA:  Midsternal chest pain and shortness of breath. CT earlier this day demonstrating left lower lobe consolidation. EXAM: CHEST  2 VIEW COMPARISON:  CT abdomen/ pelvis earlier this day. Radiograph 04/07/2015 FINDINGS: Left lung base opacity, throughout the left lower lobe. Right lung is clear. Cardiomegaly and vascular congestion, unchanged from prior exam. Detailed evaluation limited by soft tissue attenuation from large body habitus. IMPRESSION: Left lower lobe opacity, concerning for pneumonia. Followup PA and lateral chest X-ray is recommended in 3-4 weeks following trial of antibiotic therapy to ensure resolution and exclude underlying malignancy. Stable cardiomegaly and vascular congestion, appears chronic. Electronically Signed   By: Rubye Oaks M.D.   On: 10/01/2015 22:41   Ct Abdomen Pelvis W Contrast  10/01/2015  CLINICAL DATA:  Abdominal pain for 4 days with nausea and vomiting, hemoptysis EXAM: CT ABDOMEN AND PELVIS WITH CONTRAST TECHNIQUE: Multidetector CT imaging of the abdomen and pelvis was performed using the standard protocol following bolus administration of intravenous contrast. CONTRAST:  OMNIPAQUE IOHEXOL 300 MG/ML  SOLN COMPARISON:  09/15/2012 FINDINGS: Lower chest: There is partially visualized opacity occupying much of the left lower lobe. The visualized portion measures 11 x 8 cm but extends off the cranial edge of the scan volume. The more  anterior aspect demonstrates air bronchograms while the more posterior aspect appears more solid. No significant pleural effusion. Hepatobiliary: The liver and gallbladder appear normal Pancreas: Pancreas is normal Spleen: Spleen is normal Adrenals/Urinary Tract: Normal adrenal glands. Right kidney is normal. 2 cm rounded focus of low attenuation midpole left kidney stable. Average attenuation is 12. This is most consistent with a cyst. There is no hydronephrosis. Bladder appears normal. Stomach/Bowel: Large bowel is normal. Appendix appears normal. Vomiting and small bowel appear normal. Vascular/Lymphatic: No evidence of aortic dilatation. No significant adenopathy. Reproductive: Negative Other: No ascites Musculoskeletal: No acute findings IMPRESSION: Extensive partially visualized airspace disease left lower lobe there are areas demonstrating air bronchograms, with the other air is or solid. This likely represents pneumonia. Consider CT thorax to fully visualize the process. Electronically Signed   By: Esperanza Heir M.D.   On: 10/01/2015  18:57   Dg Foot 2 Views Right  10/01/2015  CLINICAL DATA:  Foot infection.  Diabetes.  Rule out foreign body EXAM: RIGHT FOOT - 2 VIEW COMPARISON:  06/25/2011 FINDINGS: Negative for fracture. Negative for osteomyelitis. No foreign body identified. Arterial calcification IMPRESSION: Negative. Electronically Signed   By: Marlan Palau M.D.   On: 10/01/2015 22:41    Medications:  Scheduled: . acetaminophen  650 mg Oral Once  . amLODipine  5 mg Oral Daily  . azithromycin  500 mg Oral Daily  . carvedilol  6.25 mg Oral BID  . cefpodoxime  200 mg Oral Q12H  . guaiFENesin  1,200 mg Oral BID  . insulin aspart  0-5 Units Subcutaneous QHS  . insulin aspart  0-9 Units Subcutaneous TID WC  . levalbuterol  1.25 mg Nebulization BID  . pantoprazole (PROTONIX) IV  40 mg Intravenous Q12H  . rivaroxaban  20 mg Oral Q supper  . sucralfate  1 g Oral TID WC & HS  . torsemide  20  mg Oral Daily   Continuous:   [id number], levalbuterol, nitroGLYCERIN, oxyCODONE-acetaminophen  Assessment/Plan:  Principal Problem:   CAP (community acquired pneumonia) Active Problems:   Diabetes mellitus type 2 with complications, uncontrolled (HCC)   Essential hypertension   Obesity, morbid, BMI 50 or higher (HCC)   Back pain, chronic   Atrial fibrillation with RVR, now rate controlled   Anticoagulated, on Xarelto, Chads2Vas score 2   Type 2 diabetes mellitus (HCC)   Chronic combined systolic and diastolic heart failure (HCC)   Sepsis (HCC)   Right foot pain   Acute on chronic kidney failure-II   Hematemesis    CAP and sepsis due to CAP Patient is slowly improving. WBC is improved. Change to oral antibiotics. Mucinex. Nebulizers as needed. Cultures are negative so far. Influenza PCR was negative. Urine for strep and legionella antigens . His negative. Lactic acid levels are normal. Pro-calcitonin was 4.1.   Hematemesis He is to have been a very low volume hematemesis. He is to have resolved. Hemoglobin is stable. Seen by gastroenterology. Appreciate their input. Continue PPI. No indication for endoscopy at this time. One will be arranged as outpatient.   Afib with RVR Patient's heart rate was up to the 130s at the time of admission. He did require Cardizem infusion briefly. Now heart rate is well controlled on oral blocker. Continue to monitor for now. CHA2DS2-VASc Score is 2, needs oral anticoagulation. Patient is on Xarelto at home, which was held due to the hematemesis. Okay to resume anticoagulation today.   DM-II Last A1c 8.6 on 11/07/14. Patient is taking metformin and glipizide at home. Continue SSI alone for now.  Essential Hypertension Holding Diovan and diuretics due to worsening renal function. Creatinine is stable. Continue Coreg. May resume his home medications in a day or 2. IV hydralazine when necessary  Acute on CKD-II Baseline Cre is 1.0. Renal  function has improved. Likely due to prerenal secondary to dehydration and continuation of ARB, diruetics. No hydronephrosis by CT abdomen/pelvis. Continue to monitor.  Chronic combined systolic and diastolic heart failure 2-D echo on 01/31/15 showed EF 30-35 percent. Patient has a trace amount of leg edema. CHF is compensated on admission. Holding Lasix and torsemide due to worsening renal function. Consider resuming in the next 24-48 hrs.  Right foot pain Etiology is not clear. Patient has tenderness over the plantar area of the right foot. No ulcers or wounds. X-ray did not show any acute process. Patient is  followed by podiatry as an outpatient. This can be further pursued in that setting.   DVT Prophylaxis: Okay to resume Xarelto today.  Code Status: Full code  Family Communication: Discussed with the patient and his wife  Disposition Plan: Okay for transfer to telemetry. Anticipate discharge tomorrow.     LOS: 2 days   [facility]  [facility] Pager [phone] 10/03/2015, 8:02 AM  If 7PM-7AM, please contact night-coverage at [email or web address], password [facility]

## 2015-10-03 NOTE — Progress Notes (Signed)
Received diabetes Coordinator consult.  Spoke with patient and his wife.  Patient saw PCP last week and has appointment with him next week. Patient was diagnosed with diabetes about 5 years ago. On Glucotrol and Metformin at home. States that he checks blood sugars once a day. Noted that weight is 421 pounds.  States that he has always been overweight. Losing weight would help his diabetes and his chronic back pain. Dietician talked with patient and wife and gave them information on foods to eat. Spoke with him about sick days and to check blood sugars every 4 hours when sick and not able to eat.  Can always call physician office if blood sugars were to be greater than 250 mg/dl or on the low side below 70 mg/dl.  States that blood sugars at home are never greater than 200 mg/dl. Will continue to monitor blood sugars while in the hospital. Smith Mince RN BSN CDE

## 2015-10-03 NOTE — Plan of Care (Signed)
Problem: Limited Adherence to Nutrition-Related Recommendations (NB-1.6) Goal: Nutrition education Formal process to instruct or train a patient/client in a skill or to impart knowledge to help patients/clients voluntarily manage or modify food choices and eating behavior to maintain or improve health. Outcome: Completed/Met Date Met:  10/03/15  RD consulted for nutrition education regarding obesity/diabetes.     Lab Results  Component Value Date    HGBA1C 8.6* 11/07/2014    RD provided "Carbohydrate Counting for People with Diabetes" handout from the Academy of [employer]etics. Discussed different food groups and their effects on blood sugar, emphasizing carbohydrate-containing foods. Provided list of carbohydrates and recommended serving sizes of common foods.  Discussed importance of controlled and consistent carbohydrate intake throughout the day. Provided examples of ways to balance meals/snacks and encouraged intake of high-fiber, whole grain complex carbohydrates. Teach back method used.  Expect fair compliance.  Body mass index is 51.27 kg/(m^2). Pt meets criteria for Obesity Class III based on current BMI.  Current diet order is Carbohydrate Modified.  Labs and medications reviewed. No further nutrition interventions warranted at this time. If additional nutrition issues arise, please re-consult RD.  Arthur Holms, RD, LDN Pager #: [phone] After-Hours Pager #: [phone]

## 2015-10-03 NOTE — Progress Notes (Signed)
Report called to 5 Chad - spoke to [employer] Wife and pt aware  Of pending  transfer

## 2015-10-04 LAB — BASIC METABOLIC PANEL
Anion gap: 11 (ref 5–15)
BUN: 15 mg/dL (ref 6–20)
CO2: 21 mmol/L — ABNORMAL LOW (ref 22–32)
Calcium: 8.3 mg/dL — ABNORMAL LOW (ref 8.9–10.3)
Chloride: 104 mmol/L (ref 101–111)
Creatinine, Ser: 0.93 mg/dL (ref 0.61–1.24)
GFR calc Af Amer: >60 mL/min (ref >60)
GFR calc non Af Amer: >60 mL/min (ref >60)
Glucose, Bld: 198 mg/dL — ABNORMAL HIGH (ref 65–99)
Potassium: 4.8 mmol/L (ref 3.5–5.1)
Sodium: 136 mmol/L (ref 135–145)

## 2015-10-04 LAB — CBC
HCT: 40.9 % (ref 39.0–52.0)
Hemoglobin: 13.6 g/dL (ref 13.0–17.0)
MCH: 30.8 pg (ref 26.0–34.0)
MCHC: 33.3 g/dL (ref 30.0–36.0)
MCV: 92.5 fL (ref 78.0–100.0)
Platelets: 188 10*3/uL (ref 150–400)
RBC: 4.42 MIL/uL (ref 4.22–5.81)
RDW: 13.4 % (ref 11.5–15.5)
WBC: 8.2 10*3/uL (ref 4.0–10.5)

## 2015-10-04 LAB — GLUCOSE, CAPILLARY
Glucose-Capillary: 169 mg/dL — ABNORMAL HIGH (ref 65–99)
Glucose-Capillary: 246 mg/dL — ABNORMAL HIGH (ref 65–99)

## 2015-10-04 LAB — RESPIRATORY VIRUS PANEL
Adenovirus: NEGATIVE
Influenza A: NEGATIVE
Influenza B: NEGATIVE
Metapneumovirus: NEGATIVE
Parainfluenza 1: NEGATIVE
Parainfluenza 2: NEGATIVE
Parainfluenza 3: NEGATIVE
Respiratory Syncytial Virus A: NEGATIVE
Respiratory Syncytial Virus B: NEGATIVE
Rhinovirus: NEGATIVE

## 2015-10-04 MED ORDER — AZITHROMYCIN 500 MG PO TABS: 500.0000 mg | ORAL_TABLET | Freq: Every day | ORAL | Status: DC

## 2015-10-04 MED ORDER — OMEPRAZOLE 20 MG PO CPDR: 20.0000 mg | DELAYED_RELEASE_CAPSULE | Freq: Every day | ORAL | Status: DC

## 2015-10-04 MED ORDER — OXYCODONE-ACETAMINOPHEN 5-325 MG PO TABS: 1.0000 | ORAL_TABLET | Freq: Four times a day (QID) | ORAL | Status: DC | PRN

## 2015-10-04 MED ORDER — CEFPODOXIME PROXETIL 200 MG PO TABS: 200.0000 mg | ORAL_TABLET | Freq: Two times a day (BID) | ORAL | Status: DC

## 2015-10-04 MED ORDER — GUAIFENESIN ER 600 MG PO TB12: 1200.0000 mg | ORAL_TABLET | Freq: Two times a day (BID) | ORAL | Status: DC

## 2015-10-04 MED ORDER — SUCRALFATE 1 G PO TABS: 1.0000 g | ORAL_TABLET | Freq: Three times a day (TID) | ORAL | Status: DC

## 2015-10-04 NOTE — Discharge Summary (Signed)
[facility]  Physician Discharge Summary   Patient ID: Maurice Nguyen MRN: [id number] DOB/AGE: Aug 18, 1965 50 y.o.  Admit date: 10/01/2015 Discharge date: 10/04/2015  PCP: Aura Dials, MD  DISCHARGE DIAGNOSES:  Principal Problem:   CAP (community acquired pneumonia) Active Problems:   Diabetes mellitus type 2 with complications, uncontrolled (HCC)   Essential hypertension   Obesity, morbid, BMI 50 or higher (HCC)   Back pain, chronic   Atrial fibrillation with RVR, now rate controlled   Anticoagulated, on Xarelto, Chads2Vas score 2   Type 2 diabetes mellitus (HCC)   Chronic combined systolic and diastolic heart failure (HCC)   Sepsis (HCC)   Right foot pain   Acute on chronic kidney failure-II   Hematemesis   RECOMMENDATIONS FOR OUTPATIENT FOLLOW UP: 1. Patient instructed to follow-up with his primary care provider 2. Consider checking renal function at outpatient follow-up. 3. Patient has an outpatient appointment with gastroenterology on March 23   DISCHARGE CONDITION: fair  Diet recommendation: As before  [facility]   10/02/15 0215  Weight: 190.964 kg (421 lb)    INITIAL HISTORY: 50 year old African-American male with a past medical history of hypertension, diabetes, morbid obesity, chronic back pain, combined systolic and diastolic CHF, atrial fibrillation on anticoagulation, chronic kidney disease stage II, presented with cough, hematemesis, shortness of breath. He was found to have atrial fibrillation with RVR. Hemoglobin was normal. He was found to have a pneumonia. He was hospitalized for further management.  Consultations:  [facility] gastroenterology   [facility] COURSE:   CAP and sepsis due to CAP Patient was admitted to the hospital. He was placed on intravenous antibiotics. He slowly started improving. WBC improved. Cultures have been negative. Influenza PCR was negative. Urine for strep and legionella antigens is negative. Lactic acid  levels are normal. Pro-calcitonin was 4.1. Patient was changed over to oral antibiotics. Symptomatically, he is improved.  Hematemesis He appears to have had very low volume hematemesis. This has resolved. Hemoglobin is stable. Seen by gastroenterology. Appreciate their input. Continue PPI. No indication for endoscopy at this time. Outpatient follow-up has been arranged by gastroenterology. Patient was notified.  Afib with RVR Patient's heart rate was up to the 130s at the time of admission. He did require Cardizem infusion briefly. Now heart rate is well controlled on oral blocker. CHA2DS2-VASc Score is 2, needs oral anticoagulation. Patient is on Xarelto at home, which was held due to the hematemesis. This was resumed on 3/10 once cleared by gastroenterology. He is tolerating it well.   DM-II Last A1c 8.6 on 11/07/14. May resume home medications  Essential Hypertension Renal function has returned to normal. He may continue his home medications.   Acute on CKD-II Baseline Cre is 1.0. Patient was given IV fluids. Likely due to prerenal secondary to dehydration and continuation of ARB, diruetics. No hydronephrosis by CT abdomen/pelvis. Renal function is back to baseline.  Chronic combined systolic and diastolic heart failure 2-D echo on 01/31/15 showed EF 30-35 percent. Patient has a trace amount of leg edema. CHF was compensated on admission. His diuretics were held at the time of admission due to worsening renal function. Okay to resume now. He tells me that he is on Demadex and not on Lasix.   Right foot pain Etiology is not clear. Patient has tenderness over the plantar area of the right foot. No ulcers or wounds. X-ray did not show any acute process. Patient is followed by podiatry as an outpatient. This can be further pursued in that  setting.  Overall stable. He is improved. Okay for discharge today.   PERTINENT LABS:  The results of significant diagnostics from this hospitalization  (including imaging, microbiology, ancillary and laboratory) are listed below for reference.    Microbiology: Recent Results (from the past 240 hour(s))  Blood culture (routine x 2)     Status: None (Preliminary result)   Collection Time: 10/01/15  8:41 PM  Result Value Ref Range Status   Specimen Description BLOOD RIGHT HAND  Final   Special Requests BOTTLES DRAWN AEROBIC AND ANAEROBIC 5CC  Final   Culture NO GROWTH 2 DAYS  Final   Report Status PENDING  Incomplete  Respiratory virus panel     Status: None   Collection Time: 10/02/15  1:22 AM  Result Value Ref Range Status   Respiratory Syncytial Virus A Negative Negative Final   Respiratory Syncytial Virus B Negative Negative Final   Influenza A Negative Negative Final   Influenza B Negative Negative Final   Parainfluenza 1 Negative Negative Final   Parainfluenza 2 Negative Negative Final   Parainfluenza 3 Negative Negative Final   Metapneumovirus Negative Negative Final   Rhinovirus Negative Negative Final   Adenovirus Negative Negative Final    Comment: (NOTE) Performed At: [facility] [address] [place], [place] [id number] Mila Homer MD WU:[id number]   MRSA PCR Screening     Status: None   Collection Time: 10/02/15  1:37 AM  Result Value Ref Range Status   MRSA by PCR NEGATIVE NEGATIVE Final    Comment:        The GeneXpert MRSA Assay (FDA approved for NASAL specimens only), is one component of a comprehensive MRSA colonization surveillance program. It is not intended to diagnose MRSA infection nor to guide or monitor treatment for MRSA infections.      Labs: Basic Metabolic Panel:  Recent Labs Lab 10/01/15 1440 10/02/15 0955 10/03/15 0330 10/04/15 0711  NA 137 138 134* 136  K 4.6 4.3 4.0 4.8  CL 104 104 102 104  CO2 23 23 21* 21*  GLUCOSE 287* 159* 204* 198*  BUN 12 12 17 15   CREATININE 1.40* 1.17 1.11 0.93  CALCIUM 8.7* 8.5* 8.5* 8.3*   Liver Function Tests:  Recent Labs Lab  10/01/15 1440  AST 18  ALT 11*  ALKPHOS 56  BILITOT 2.5*  PROT 7.3  ALBUMIN 2.8*    Recent Labs Lab 10/01/15 1440  LIPASE 20   CBC:  Recent Labs Lab 10/02/15 0955 10/02/15 1612 10/02/15 2204 10/03/15 0330 10/04/15 0711  WBC 14.4* 12.7* 10.7* 9.4 8.2  HGB 12.7* 12.7* 12.3* 13.4 13.6  HCT 38.2* 37.9* 37.3* 41.1 40.9  MCV 95.0 95.0 92.3 94.5 92.5  PLT 198 187 181 223 188   BNP: BNP (last 3 results)  Recent Labs  01/30/15 2358 04/07/15 1858 10/02/15 0153  BNP 628.7* 462.5* 541.2*     CBG:  Recent Labs Lab 10/03/15 1232 10/03/15 1736 10/03/15 2203 10/04/15 0824 10/04/15 1244  GLUCAP 199* 188* 219* 169* 246*     IMAGING STUDIES Dg Chest 2 View  10/01/2015  CLINICAL DATA:  Midsternal chest pain and shortness of breath. CT earlier this day demonstrating left lower lobe consolidation. EXAM: CHEST  2 VIEW COMPARISON:  CT abdomen/ pelvis earlier this day. Radiograph 04/07/2015 FINDINGS: Left lung base opacity, throughout the left lower lobe. Right lung is clear. Cardiomegaly and vascular congestion, unchanged from prior exam. Detailed evaluation limited by soft tissue attenuation from large body habitus. IMPRESSION:  Left lower lobe opacity, concerning for pneumonia. Followup PA and lateral chest X-ray is recommended in 3-4 weeks following trial of antibiotic therapy to ensure resolution and exclude underlying malignancy. Stable cardiomegaly and vascular congestion, appears chronic. Electronically Signed   By: Rubye Oaks M.D.   On: 10/01/2015 22:41   Ct Abdomen Pelvis W Contrast  10/01/2015  CLINICAL DATA:  Abdominal pain for 4 days with nausea and vomiting, hemoptysis EXAM: CT ABDOMEN AND PELVIS WITH CONTRAST TECHNIQUE: Multidetector CT imaging of the abdomen and pelvis was performed using the standard protocol following bolus administration of intravenous contrast. CONTRAST:  OMNIPAQUE IOHEXOL 300 MG/ML  SOLN COMPARISON:  09/15/2012 FINDINGS: Lower chest:  There is partially visualized opacity occupying much of the left lower lobe. The visualized portion measures 11 x 8 cm but extends off the cranial edge of the scan volume. The more anterior aspect demonstrates air bronchograms while the more posterior aspect appears more solid. No significant pleural effusion. Hepatobiliary: The liver and gallbladder appear normal Pancreas: Pancreas is normal Spleen: Spleen is normal Adrenals/Urinary Tract: Normal adrenal glands. Right kidney is normal. 2 cm rounded focus of low attenuation midpole left kidney stable. Average attenuation is 12. This is most consistent with a cyst. There is no hydronephrosis. Bladder appears normal. Stomach/Bowel: Large bowel is normal. Appendix appears normal. Vomiting and small bowel appear normal. Vascular/Lymphatic: No evidence of aortic dilatation. No significant adenopathy. Reproductive: Negative Other: No ascites Musculoskeletal: No acute findings IMPRESSION: Extensive partially visualized airspace disease left lower lobe there are areas demonstrating air bronchograms, with the other air is or solid. This likely represents pneumonia. Consider CT thorax to fully visualize the process. Electronically Signed   By: Esperanza Heir M.D.   On: 10/01/2015 18:57   Dg Foot 2 Views Right  10/01/2015  CLINICAL DATA:  Foot infection.  Diabetes.  Rule out foreign body EXAM: RIGHT FOOT - 2 VIEW COMPARISON:  06/25/2011 FINDINGS: Negative for fracture. Negative for osteomyelitis. No foreign body identified. Arterial calcification IMPRESSION: Negative. Electronically Signed   By: Marlan Palau M.D.   On: 10/01/2015 22:41    DISCHARGE EXAMINATION: Filed Vitals:   10/03/15 1644 10/03/15 2207 10/04/15 0542 10/04/15 0830  BP: 125/85 144/65 106/67 147/108  Pulse: 86 99 93 114  Temp: 98.7 F (37.1 C) 99 F (37.2 C) 98.4 F (36.9 C)   TempSrc: Oral Oral Oral   Resp: 18 18 16    Height:      Weight:      SpO2: 98% 95% 93%    General appearance:  alert, cooperative, appears stated age and no distress Resp: Improved air entry bilaterally. Coarse breath sounds. No crackles or wheezing. Cardio: regular rate and rhythm, S1, S2 normal, no murmur, click, rub or gallop GI: soft, non-tender; bowel sounds normal; no masses,  no organomegaly Extremities: Minimal pedal edema  DISPOSITION: Home  Discharge Instructions    Call MD for:  difficulty breathing, headache or visual disturbances    Complete by:  As directed      Call MD for:  extreme fatigue    Complete by:  As directed      Call MD for:  persistant dizziness or light-headedness    Complete by:  As directed      Call MD for:  persistant nausea and vomiting    Complete by:  As directed      Call MD for:  severe uncontrolled pain    Complete by:  As directed  Call MD for:  temperature >100.4    Complete by:  As directed      Diet Carb Modified    Complete by:  As directed      Discharge instructions    Complete by:  As directed   Please follow up with your PCP before end of week.  You were cared for by a hospitalist during your hospital stay. If you have any questions about your discharge medications or the care you received while you were in the hospital after you are discharged, you can call the unit and asked to speak with the hospitalist on call if the hospitalist that took care of you is not available. Once you are discharged, your primary care physician will handle any further medical issues. Please note that NO REFILLS for any discharge medications will be authorized once you are discharged, as it is imperative that you return to your primary care physician (or establish a relationship with a primary care physician if you do not have one) for your aftercare needs so that they can reassess your need for medications and monitor your lab values. If you do not have a primary care physician, you can call [phone] for a physician referral.     Increase activity slowly    Complete  by:  As directed            ALLERGIES: No Known Allergies   Discharge Medication List as of 10/04/2015 12:33 PM    START taking these medications   Details  azithromycin (ZITHROMAX) 500 MG tablet Take 1 tablet (500 mg total) by mouth daily. For 4 more days, Starting 10/04/2015, Until Discontinued, Print    cefpodoxime (VANTIN) 200 MG tablet Take 1 tablet (200 mg total) by mouth every 12 (twelve) hours. For 5 more days, Starting 10/04/2015, Until Discontinued, Print    guaiFENesin (MUCINEX) 600 MG 12 hr tablet Take 2 tablets (1,200 mg total) by mouth 2 (two) times daily., Starting 10/04/2015, Until Discontinued, Print    omeprazole (PRILOSEC) 20 MG capsule Take 1 capsule (20 mg total) by mouth daily. For 1 month, Starting 10/04/2015, Until Discontinued, Print    sucralfate (CARAFATE) 1 g tablet Take 1 tablet (1 g total) by mouth 4 (four) times daily -  with meals and at bedtime. For 10 days, Starting 10/04/2015, Until Discontinued, Print      CONTINUE these medications which have CHANGED   Details  oxyCODONE-acetaminophen (PERCOCET/ROXICET) 5-325 MG tablet Take 1 tablet by mouth every 6 (six) hours as needed for moderate pain., Starting 10/04/2015, Until Discontinued, Print      CONTINUE these medications which have NOT CHANGED   Details  carvedilol (COREG) 6.25 MG tablet Take 1 tablet (6.25 mg total) by mouth 2 (two) times daily., Starting 02/03/2015, Until Discontinued, Print    empagliflozin (JARDIANCE) 25 MG TABS tablet Take 25 mg by mouth daily., Starting 09/16/2015, Until Discontinued, Historical Med    glipiZIDE (GLUCOTROL) 10 MG tablet Take 1 tablet (10 mg total) by mouth 2 (two) times daily before a meal., Starting 02/03/2015, Until Discontinued, Print    metFORMIN (GLUCOPHAGE) 1000 MG tablet Take 1 tablet (1,000 mg total) by mouth 2 (two) times daily with a meal., Starting 02/03/2015, Until Discontinued, Print    nitroGLYCERIN (NITROSTAT) 0.4 MG SL tablet Place 1 tablet (0.4 mg  total) under the tongue every 5 (five) minutes x 3 doses as needed for chest pain., Starting 02/03/2015, Until Discontinued, Print    potassium chloride SA (K-DUR,KLOR-CON) 20 MEQ  tablet Take 2 tablets (40 mEq total) by mouth daily. TAKE IN THE AM, Starting 05/20/2015, Until Discontinued, Normal    rivaroxaban (XARELTO) 20 MG TABS tablet Take 1 tablet (20 mg total) by mouth daily with supper., Starting 02/03/2015, Until Discontinued, Print    torsemide (DEMADEX) 20 MG tablet Take 2 tablets (40 mg total) by mouth daily., Starting 05/20/2015, Until Discontinued, Normal    valsartan (DIOVAN) 80 MG tablet Take 1 tablet (80 mg total) by mouth daily., Starting 02/03/2015, Until Discontinued, Print      STOP taking these medications     furosemide (LASIX) 20 MG tablet        Follow-up Information    Follow up with BOUSKA,DAVID E, MD. Schedule an appointment as soon as possible for a visit in 1 week.   Specialty:  Family Medicine   Why:  post hospitalization follow up   Contact information:   [address] [place] [address] [phone]       TOTAL DISCHARGE TIME: 35 minutes  [facility]  [facility] Pager [phone]  10/04/2015, 3:35 PM

## 2015-10-04 NOTE — Progress Notes (Signed)
Pt preferred to sleep in recliner in the room. Wife slept in pt's bed throughout the night while also wearing a hospital gown. Pt and wife were educated that the pt's bed is solely for the patient and not for guests for safety reasons. Wife was educated that gown is strictly for patients only in the hospital. Pt and wife remained in room in which the pt was sitting in the recliner and the wife was lying in the bed continuing to wear the hospital gown.

## 2015-10-04 NOTE — Progress Notes (Signed)
Reviewed discharge instructions with pt and wife.  Prescriptions given.  PIV removed.  Pt taken to discharge location via wheelchair.

## 2015-10-06 LAB — CULTURE, BLOOD (ROUTINE X 2)
Culture: NO GROWTH
Culture: NO GROWTH

## 2015-12-01 DIAGNOSIS — Z1159 Encounter for screening for other viral diseases: Secondary | ICD-10-CM | POA: Insufficient documentation

## 2016-03-11 ENCOUNTER — Encounter: Payer: Self-pay | Admitting: Internal Medicine

## 2016-03-22 ENCOUNTER — Encounter: Payer: Self-pay | Admitting: Neurology

## 2016-03-22 ENCOUNTER — Ambulatory Visit (INDEPENDENT_AMBULATORY_CARE_PROVIDER_SITE_OTHER): Payer: Medicare Other | Admitting: Neurology

## 2016-03-22 VITALS — BP 126/81 | HR 53 | Resp 22 | Ht 74.0 in | Wt >= 6400 oz

## 2016-03-22 DIAGNOSIS — G4733 Obstructive sleep apnea (adult) (pediatric): Secondary | ICD-10-CM | POA: Diagnosis not present

## 2016-03-22 DIAGNOSIS — G2581 Restless legs syndrome: Secondary | ICD-10-CM

## 2016-03-22 DIAGNOSIS — G4761 Periodic limb movement disorder: Secondary | ICD-10-CM

## 2016-03-22 DIAGNOSIS — I5042 Chronic combined systolic (congestive) and diastolic (congestive) heart failure: Secondary | ICD-10-CM | POA: Diagnosis not present

## 2016-03-22 DIAGNOSIS — I482 Chronic atrial fibrillation, unspecified: Secondary | ICD-10-CM

## 2016-03-22 NOTE — Progress Notes (Signed)
Subjective:    Patient ID: Maurice Nguyen is a 50 y.o. male.  HPI     Huston Foley, MD, PhD [facility] [address], [address] [address] [place], [place] [address]  Dear Dr. Everlene Other,   I saw your patient, Maurice Nguyen, upon your kind request, in my neurologic clinic today for initial consultation of his sleep disorder, in particular, concern for underlying obstructive sleep apnea. The patient is accompanied by his girlfriend today. As you know, Maurice Nguyen is a 50 year old right-handed gentleman with an underlying complex medical history of A. fib, on Xarelto, type 2 diabetes, combined systolic and diastolic heart failure, chronic kidney failure, hypertension, morbid obesity, chronic back pain, recent admission to the hospital for community-acquired pneumonia, who reports snoring and excessive daytime somnolence. I reviewed your office note from 03/10/2016, which you kindly included. I also reviewed his hospital records from March. He was admitted on 10/01/2015 and discharged on 10/04/2015. He was admitted with cough, hematemesis, and shortness of breath. He was diagnosed with community-acquired pneumonia and sepsis and was treated with IV antibiotics. He was seen by gastroenterology for hematemesis and was advised to take his PPI. He was also advised to follow-up as an outpatient with GI. His Epworth sleepiness score is 6 out of 24 today, his fatigue score is 38/63. He denies morning headaches or nocturia. He has RLS symptoms and Maurice Nguyen reports, he kicks and moves his legs in his sleep. He has a cousin with OSA, on CPAP. He takes oxycodone once or 2 times a day.  He has been sleeping upright, in the chair or recliner, and has been doing this for well over a year, he estimates. Sleeps in the den with the TV on all night.  He does not keep a sleep schedule, generally goes to bed after midnight, wakeup time varies, depending on how much sleep he got and when he went  to bed. He is a nonsmoker, does not drink alcohol, denies illicit drug use and does not drink caffeine on a daily basis. He does not take his diuretic daily, generally 3-4 times a week.  He snores, this can be loud, depending on his sleep position, and he has woken up with a sense of gasping for air. His sleep is very interrupted, he has multiple nighttime awakenings.  His Past Medical History Is Significant For: Past Medical History:  Diagnosis Date  . A-fib (HCC)   . Anticoagulated, on Xarelto, Chads2Vas score 2 10/02/2013  . Back pain, chronic 07/13/2012  . CHF (congestive heart failure) (HCC)   . 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 CKD (chronic kidney disease), stage II   . Complication of anesthesia    " THEY LOST ME AND BROUGHT ME BACK "  . Hypertension 07/13/2012  . Irregular heart beat   . Lumbar herniated disc   . Morbid obesity (HCC)   . Obesity, morbid, BMI 50 or higher (HCC) 07/13/2012  . 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 Shortness of breath   . Type II diabetes mellitus (HCC) 07/13/2012    His Past Surgical History Is Significant For: Past Surgical History:  Procedure Laterality Date  . ABSCESS DRAINAGE    . COLONOSCOPY  08/22/2012   Procedure: COLONOSCOPY;  Surgeon: Shirley Friar, MD;  Location: [facility];  Service: Endoscopy;  Laterality: N/A;  . DENTAL SURGERY      His Family History Is Significant For: No family history on file.  His Social History Is Significant For: Social History   Social History  . Marital status: Single    Spouse name: N/A  . Number of children: N/A  . Years of education: N/A   Occupational History  . N/A    Social History Main Topics  . Smoking status: Never Smoker  . Smokeless tobacco: Never Used  . Alcohol use No     Comment: rarely  . Drug use: No  . Sexual activity: Yes   Other Topics Concern  . None   Social History Narrative   Denies caffeine use     His Allergies Are:  No  Known Allergies:   His Current Medications Are:  Outpatient Encounter Prescriptions as of 03/22/2016  Medication Sig  . carvedilol (COREG) 6.25 MG tablet Take 1 tablet (6.25 mg total) by mouth 2 (two) times daily.  Marland Kitchen glipiZIDE (GLUCOTROL) 10 MG tablet Take 1 tablet (10 mg total) by mouth 2 (two) times daily before a meal.  . metFORMIN (GLUCOPHAGE) 1000 MG tablet Take 1 tablet (1,000 mg total) by mouth 2 (two) times daily with a meal.  . metFORMIN (GLUCOPHAGE) 1000 MG tablet take 1 tablet by mouth twice a day with meals  . nitroGLYCERIN (NITROSTAT) 0.4 MG SL tablet Place 1 tablet (0.4 mg total) under the tongue every 5 (five) minutes x 3 doses as needed for chest pain.  Marland Kitchen omeprazole (PRILOSEC) 20 MG capsule Take 1 capsule (20 mg total) by mouth daily. For 1 month  . oxyCODONE-acetaminophen (PERCOCET/ROXICET) 5-325 MG tablet Take 1 tablet by mouth every 6 (six) hours as needed for moderate pain.  . potassium chloride SA (K-DUR,KLOR-CON) 20 MEQ tablet Take 2 tablets (40 mEq total) by mouth daily. TAKE IN THE AM  . rivaroxaban (XARELTO) 20 MG TABS tablet Take 1 tablet (20 mg total) by mouth daily with supper.  . rivaroxaban (XARELTO) 20 MG TABS tablet Take 20 mg by mouth.  . sitaGLIPtin (JANUVIA) 100 MG tablet Take 100 mg by mouth.  . sucralfate (CARAFATE) 1 g tablet Take 1 tablet (1 g total) by mouth 4 (four) times daily -  with meals and at bedtime. For 10 days  . torsemide (DEMADEX) 20 MG tablet Take 2 tablets (40 mg total) by mouth daily.  . valsartan (DIOVAN) 80 MG tablet Take 1 tablet (80 mg total) by mouth daily.  . [DISCONTINUED] azithromycin (ZITHROMAX) 500 MG tablet Take 1 tablet (500 mg total) by mouth daily. For 4 more days   No facility-administered encounter medications on file as of 03/22/2016.   :  Review of Systems:  Out of a complete 14 point review of systems, all are reviewed and negative with the exception of these symptoms as listed below: Review of Systems  Neurological:        Patient has trouble staying asleep, snoring, patient sleeps sitting up, wakes up choking, wakes up feeling tired, daytime tiredness, sometimes takes naps. Patient states that he does not sleep more than 2 hours at a time.   Epworth Sleepiness Scale 0= would never doze 1= slight chance of dozing 2= moderate chance of dozing 3= high chance of dozing  Sitting and reading:0 Watching TV:3 Sitting inactive in a public place (ex. Theater or meeting):0 As a passenger in a car for an hour without a break:0 Lying down to rest in the afternoon:3 Sitting and talking to someone:0 Sitting quietly after lunch (no alcohol):0 In a car, while stopped in traffic:0 Total:6   Objective:  Neurologic Exam  Physical Exam Physical Examination:   Vitals:   03/22/16 0901  Resp: (!) 22   General Examination: The patient is a very pleasant 50 y.o. male in no acute distress. He appears well-developed and well-nourished and adequately groomed.   HEENT: Normocephalic, atraumatic, pupils are equal, round and reactive to light and accommodation. Extraocular tracking is good without limitation to gaze excursion or nystagmus noted. Normal smooth pursuit is noted. Hearing is grossly intact. May have mild cataracts. Face is symmetric with normal facial animation and normal facial sensation. Speech is clear with no dysarthria noted. There is no hypophonia. There is no lip, neck/head, jaw or voice tremor. Neck is supple with full range of passive and active motion. There are no carotid bruits on auscultation. Oropharynx exam reveals: mild mouth dryness, adequate dental hygiene and marked airway crowding, due to large tongue, large uvula, thicker soft palate. Mallampati is class III. Tongue protrudes centrally and palate elevates symmetrically. Tonsils are in place, not fully visualized. Neck size is 20 3/8 inches. He has a Mild overbite.   Chest: Clear to auscultation without wheezing, rhonchi or crackles  noted.  Heart: S1+S2+0, irregularly irregular without murmurs, rubs or gallops noted.   Abdomen: Soft, non-tender and non-distended with normal bowel sounds appreciated on auscultation.  Extremities: There is 2+ pitting edema in the distal lower extremities bilaterally, wearing his compression stockings to up to knees bilaterally.   Skin: Warm and dry without trophic changes noted.  Reports an ulcer on his skin in his distal legs, could not inspect due to very tight compression stockings.   Musculoskeletal: exam reveals no obvious joint deformities, tenderness or joint swelling or erythema, but reports back pain.    Neurologically:  Mental status: The patient is awake, alert and oriented in all 4 spheres. His immediate and remote memory, attention, language skills and fund of knowledge are appropriate. There is no evidence of aphasia, agnosia, apraxia or anomia. Speech is clear with normal prosody and enunciation. Thought process is linear. Mood is normal and affect is normal.  Cranial nerves II - XII are as described above under HEENT exam. In addition: shoulder shrug is normal with equal shoulder height noted. Motor exam: Normal bulk, strength and tone is noted. There is no drift, tremor or rebound. Romberg is negative. Reflexes are diminished throughout. Fine motor skills and coordination: impaired bilaterally, mostly d/t body habitus.  Cerebellar testing: No dysmetria or intention tremor on finger to nose testing. Heel to shin is not possible. There is no truncal or gait ataxia.  Sensory exam: difficult to tell.   Gait, station and balance: He stands with difficulty. No veering to one side is noted. No leaning to one side is noted. Posture is age-appropriate and stance is narrow based. Gait shows slow and cautious gait. Tandem walk is not possible.   Assessment and Plan:  In summary, Maurice Nguyen is a very pleasant 50 y.o.-year old male with an underlying complex medical history of A.  fib, on Xarelto, type 2 diabetes, combined systolic and diastolic heart failure, chronic kidney failure, hypertension, morbid obesity, chronic back pain, recent admission to the hospital for community-acquired pneumonia, whose history and physical exam are in keeping with obstructive sleep apnea (OSA). He reports RLS symptoms as well.  I had a long chat with the patient and his GF about my findings and the diagnosis of OSA, its prognosis and treatment options. We talked about medical treatments, surgical interventions and non-pharmacological approaches. I explained in particular the risks  and ramifications of untreated moderate to severe OSA, especially with respect to developing cardiovascular disease down the Road, including congestive heart failure, difficult to treat hypertension, cardiac arrhythmias, or stroke. Even type 2 diabetes has, in part, been linked to untreated OSA. Symptoms of untreated OSA include daytime sleepiness, memory problems, mood irritability and mood disorder such as depression and anxiety, lack of energy, as well as recurrent headaches, especially morning headaches. We talked about trying to maintain a healthy lifestyle in general, as well as the importance of weight control. I encouraged the patient to eat healthy, exercise daily and keep well hydrated, to keep a scheduled bedtime and wake time routine, to not skip any meals and eat healthy snacks in between meals. I advised the patient not to drive when feeling sleepy. I recommended the following at this time: sleep study with potential positive airway pressure titration. (We will score hypopneas at 4% and split the sleep study into diagnostic and treatment portion, if the estimated. 2 hour AHI is >15/h).   I explained the sleep test procedure to the patient and also outlined possible surgical and non-surgical treatment options of OSA, including the use of a custom-made dental device (which would require a referral to a specialist  dentist or oral surgeon), upper airway surgical options, such as pillar implants, radiofrequency surgery, tongue base surgery, and UPPP (which would involve a referral to an ENT surgeon). Rarely, jaw surgery such as mandibular advancement may be considered.  I also explained the CPAP treatment option to the patient, who indicated that he would be willing to try CPAP if the need arises. I explained the importance of being compliant with PAP treatment, not only for insurance purposes but primarily to improve His symptoms, and for the patient's long term health benefit, including to reduce His cardiovascular risks. I answered all their questions today and the patient and his GF were in agreement. I would like to see him back after the sleep study is completed and encouraged him to call with any interim questions, concerns, problems or updates.   Thank you very much for allowing me to participate in the care of this nice patient. If I can be of any further assistance to you please do not hesitate to call me at [phone].  Sincerely,   Huston Foley, MD, PhD

## 2016-03-22 NOTE — Patient Instructions (Signed)

## 2016-03-30 ENCOUNTER — Emergency Department (HOSPITAL_COMMUNITY): Payer: Medicare Other

## 2016-03-30 ENCOUNTER — Encounter (HOSPITAL_COMMUNITY): Payer: Self-pay | Admitting: Emergency Medicine

## 2016-03-30 ENCOUNTER — Emergency Department (HOSPITAL_COMMUNITY)
Admission: EM | Admit: 2016-03-30 | Discharge: 2016-03-30 | Disposition: A | Discharge status: Home / Self Care | Payer: Medicare Other | Attending: Emergency Medicine | Admitting: Emergency Medicine

## 2016-03-30 DIAGNOSIS — Z7984 Long term (current) use of oral hypoglycemic drugs: Secondary | ICD-10-CM | POA: Diagnosis not present

## 2016-03-30 DIAGNOSIS — X58XXXA Exposure to other specified factors, initial encounter: Secondary | ICD-10-CM | POA: Diagnosis not present

## 2016-03-30 DIAGNOSIS — N182 Chronic kidney disease, stage 2 (mild): Secondary | ICD-10-CM | POA: Insufficient documentation

## 2016-03-30 DIAGNOSIS — E1122 Type 2 diabetes mellitus with diabetic chronic kidney disease: Secondary | ICD-10-CM | POA: Diagnosis not present

## 2016-03-30 DIAGNOSIS — Z79899 Other long term (current) drug therapy: Secondary | ICD-10-CM | POA: Diagnosis not present

## 2016-03-30 DIAGNOSIS — I130 Hypertensive heart and chronic kidney disease with heart failure and stage 1 through stage 4 chronic kidney disease, or unspecified chronic kidney disease: Secondary | ICD-10-CM | POA: Diagnosis not present

## 2016-03-30 DIAGNOSIS — Z23 Encounter for immunization: Secondary | ICD-10-CM | POA: Diagnosis not present

## 2016-03-30 DIAGNOSIS — Y929 Unspecified place or not applicable: Secondary | ICD-10-CM | POA: Insufficient documentation

## 2016-03-30 DIAGNOSIS — Y999 Unspecified external cause status: Secondary | ICD-10-CM | POA: Insufficient documentation

## 2016-03-30 DIAGNOSIS — I5042 Chronic combined systolic (congestive) and diastolic (congestive) heart failure: Secondary | ICD-10-CM | POA: Diagnosis not present

## 2016-03-30 DIAGNOSIS — Z7901 Long term (current) use of anticoagulants: Secondary | ICD-10-CM | POA: Insufficient documentation

## 2016-03-30 DIAGNOSIS — Y9301 Activity, walking, marching and hiking: Secondary | ICD-10-CM | POA: Diagnosis not present

## 2016-03-30 DIAGNOSIS — S8992XA Unspecified injury of left lower leg, initial encounter: Secondary | ICD-10-CM | POA: Insufficient documentation

## 2016-03-30 DIAGNOSIS — W19XXXA Unspecified fall, initial encounter: Secondary | ICD-10-CM

## 2016-03-30 LAB — CBG MONITORING, ED: Glucose-Capillary: 181 mg/dL — ABNORMAL HIGH (ref 65–99)

## 2016-03-30 MED ORDER — TETANUS-DIPHTH-ACELL PERTUSSIS 5-2.5-18.5 LF-MCG/0.5 IM SUSP
0.5000 mL | Freq: Once | INTRAMUSCULAR | Status: AC
  Administered 2016-03-30: 0.5 mL via INTRAMUSCULAR
  Filled 2016-03-30: qty 0.5

## 2016-03-30 MED ORDER — CEPHALEXIN 500 MG PO CAPS: 500.0000 mg | ORAL_CAPSULE | Freq: Four times a day (QID) | ORAL | 0 refills | Status: DC

## 2016-03-30 NOTE — ED Provider Notes (Signed)
[facility]-EMERGENCY DEPT Provider Note   CSN: [id number] Arrival date & time: 03/30/16  1306     History   Chief Complaint Chief Complaint  Patient presents with  . Leg Injury    HPI Maurice Nguyen is a 50 y.o. male who presents with a right leg injury since earlier today. Past medical history significant for chronic ulcerating wound of the left lateral leg, diabetes complicated by neuropathy, morbid obesity. He states that he was walking today when his foot went into a hole where a water meter was supposed to be. He was able to walk after the incident but is reporting pain to the ankle and lower leg. He reports skin tears and weeping of the wound. He has appointment with wound care clinic on Friday. Tdap is not up to date.  HPI  Past Medical History:  Diagnosis Date  . A-fib (HCC)   . Anticoagulated, on Xarelto, Chads2Vas score 2 10/02/2013  . Back pain, chronic 07/13/2012  . CHF (congestive heart failure) (HCC)   . 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 CKD (chronic kidney disease), stage II   . Complication of anesthesia    " THEY LOST ME AND BROUGHT ME BACK "  . Hypertension 07/13/2012  . Irregular heart beat   . Lumbar herniated disc   . Morbid obesity (HCC)   . Obesity, morbid, BMI 50 or higher (HCC) 07/13/2012  . 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 Shortness of breath   . Type II diabetes mellitus (HCC) 07/13/2012    Patient Active Problem List   Diagnosis Date Noted  . Sepsis (HCC) 10/01/2015  . CAP (community acquired pneumonia) 10/01/2015  . Right foot pain 10/01/2015  . Acute on chronic kidney failure-II 10/01/2015  . Hematemesis 10/01/2015  . Chronic ulcer of lower extremity (HCC) 04/08/2015  . Chronic combined systolic and diastolic heart failure (HCC) 04/08/2015  . Type 2 diabetes mellitus (HCC) 01/31/2015  . Atrial fibrillation (HCC) 01/31/2015  . Anticoagulated, on Xarelto, Chads2Vas score 2 10/02/2013  . Atrial fibrillation with  RVR, now rate controlled 09/27/2013  . Hypertensive urgency 09/27/2013  . Acute combined systolic and diastolic CHF, NYHA class 4, difficult to assess Diastolic function on echo 09/27/2013  . New onset atrial fibrillation (HCC) 08/22/2012  . 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 Obesity, morbid, BMI 50 or higher (HCC) 07/13/2012  . Back pain, chronic 07/13/2012  . Diabetes mellitus type 2 with complications, uncontrolled (HCC) 04/18/2009  . Essential hypertension 04/18/2009  . OTHER POSTSURGICAL STATUS OTHER 04/18/2009    Past Surgical History:  Procedure Laterality Date  . ABSCESS DRAINAGE    . COLONOSCOPY  08/22/2012   Procedure: COLONOSCOPY;  Surgeon: Shirley Friar, MD;  Location: [facility];  Service: Endoscopy;  Laterality: N/A;  . DENTAL SURGERY         Home Medications    Prior to Admission medications   Medication Sig Start Date End Date Taking? Authorizing Provider  carvedilol (COREG) 6.25 MG tablet Take 1 tablet (6.25 mg total) by mouth 2 (two) times daily. 02/03/15   [name], MD  glipiZIDE (GLUCOTROL) 10 MG tablet Take 1 tablet (10 mg total) by mouth 2 (two) times daily before a meal. 02/03/15   [name], MD  metFORMIN (GLUCOPHAGE) 1000 MG tablet Take 1 tablet (1,000 mg total) by mouth 2 (two) times daily with a meal. 02/03/15   [name], MD  metFORMIN (GLUCOPHAGE) 1000 MG tablet take 1 tablet by  mouth twice a day with meals 12/29/15   Historical Provider, MD  nitroGLYCERIN (NITROSTAT) 0.4 MG SL tablet Place 1 tablet (0.4 mg total) under the tongue every 5 (five) minutes x 3 doses as needed for chest pain. 02/03/15   [name], MD  omeprazole (PRILOSEC) 20 MG capsule Take 1 capsule (20 mg total) by mouth daily. For 1 month 10/04/15   Osvaldo Shipper, MD  oxyCODONE-acetaminophen (PERCOCET/ROXICET) 5-325 MG tablet Take 1 tablet by mouth every 6 (six) hours as needed for moderate pain. 10/04/15   Osvaldo Shipper, MD  potassium chloride SA  (K-DUR,KLOR-CON) 20 MEQ tablet Take 2 tablets (40 mEq total) by mouth daily. TAKE IN THE AM 05/20/15   Vesta Mixer, MD  rivaroxaban (XARELTO) 20 MG TABS tablet Take 1 tablet (20 mg total) by mouth daily with supper. 02/03/15   [name], MD  rivaroxaban (XARELTO) 20 MG TABS tablet Take 20 mg by mouth. 02/03/15   Historical Provider, MD  sitaGLIPtin (JANUVIA) 100 MG tablet Take 100 mg by mouth. 03/10/16 03/10/17  Historical Provider, MD  sucralfate (CARAFATE) 1 g tablet Take 1 tablet (1 g total) by mouth 4 (four) times daily -  with meals and at bedtime. For 10 days 10/04/15   Osvaldo Shipper, MD  torsemide (DEMADEX) 20 MG tablet Take 2 tablets (40 mg total) by mouth daily. 05/20/15   Vesta Mixer, MD  valsartan (DIOVAN) 80 MG tablet Take 1 tablet (80 mg total) by mouth daily. 02/03/15   [name], MD    Family History No family history on file.  Social History Social History  Substance Use Topics  . Smoking status: Never Smoker  . Smokeless tobacco: Never Used  . Alcohol use No     Comment: rarely     Allergies   Review of patient's allergies indicates no known allergies.   Review of Systems Review of Systems  Musculoskeletal: Positive for arthralgias. Negative for gait problem.  Neurological: Positive for numbness. Negative for syncope.  All other systems reviewed and are negative.    Physical Exam Updated Vital Signs BP 131/91   Pulse 89   Temp 98.3 F (36.8 C) (Oral)   Resp 16   SpO2 98%   Physical Exam  Constitutional: He is oriented to person, place, and time. He appears well-developed and well-nourished. No distress.  Morbidly obese male in NAD  HENT:  Head: Normocephalic and atraumatic.  Eyes: Conjunctivae are normal. Pupils are equal, round, and reactive to light. Right eye exhibits no discharge. Left eye exhibits no discharge. No scleral icterus.  Neck: Normal range of motion. Neck supple.  Cardiovascular: Normal rate and regular rhythm.   No  murmur heard. Pulmonary/Chest: Effort normal and breath sounds normal. No respiratory distress.  Abdominal: Soft. He exhibits no distension. There is no tenderness.  Musculoskeletal: He exhibits no edema.  Left lower leg, ankle, foot: Leg has overall generalized edema. No obvious deformity. Skin is extremity dry. Several skin tears noted over lateral aspect of lower leg that are weeping serosanguinous fluid. Chronic ulcer appears to be healing. There is tenderness to palpation over the ankle and lateral aspect of lower leg. Decreased ROM of toes and ankle. N/V intact.   Neurological: He is alert and oriented to person, place, and time.  Skin: Skin is warm and dry.  Psychiatric: He has a normal mood and affect.  Nursing note and vitals reviewed.    ED Treatments / Results  Labs (all labs ordered are listed, but  only abnormal results are displayed) Labs Reviewed  CBG MONITORING, ED - Abnormal; Notable for the following:       Result Value   Glucose-Capillary 181 (*)    All other components within normal limits    EKG  EKG Interpretation None       Radiology No results found.  Procedures Procedures (including critical care time)  Medications Ordered in ED Medications  Tdap (BOOSTRIX) injection 0.5 mL (not administered)     Initial Impression / Assessment and Plan / ED Course  I have reviewed the triage vital signs and the nursing notes.  Pertinent labs & imaging results that were available during my care of the patient were reviewed by me and considered in my medical decision making (see chart for details).  Clinical Course   50 year old male presents with left lower leg injury. X-rays are negative for acute bony pathology. Tdap updated. Wounds were cleaned and dressed. He does have a appointment coming up with wound care in the next couple days. Put on Keflex empirically due to diabetes and history of wound infections. Patient is ambulatory in the ED. Patient is NAD,  non-toxic, with stable VS. Patient is informed of clinical course, understands medical decision making process, and agrees with plan. Opportunity for questions provided and all questions answered. Return precautions given.   Final Clinical Impressions(s) / ED Diagnoses   Final diagnoses:  Left leg injury, initial encounter    New Prescriptions New Prescriptions   No medications on file     Bethel BornKelly Marie Murl Golladay, PA-C 03/30/16 1940    Mancel BaleElliott Wentz, MD 03/31/16 [phone]

## 2016-03-30 NOTE — ED Notes (Signed)
Gave pt turkey sandwich and water 

## 2016-03-30 NOTE — ED Triage Notes (Signed)
States stepped into a whole and injuried his left leg, pt states had an ulcer on that leg anyway but now has multpile scratches, pt is diabetic, leg is weeping now, sees the wound care people

## 2016-03-30 NOTE — ED Notes (Signed)
Applied nonadherent dressing to pt's left lower leg per order. Pt now in radiology

## 2016-04-02 ENCOUNTER — Encounter (HOSPITAL_BASED_OUTPATIENT_CLINIC_OR_DEPARTMENT_OTHER): Payer: Medicare Other | Attending: Internal Medicine

## 2016-04-02 DIAGNOSIS — L97822 Non-pressure chronic ulcer of other part of left lower leg with fat layer exposed: Secondary | ICD-10-CM | POA: Insufficient documentation

## 2016-04-02 DIAGNOSIS — I4891 Unspecified atrial fibrillation: Secondary | ICD-10-CM | POA: Diagnosis not present

## 2016-04-02 DIAGNOSIS — Z7901 Long term (current) use of anticoagulants: Secondary | ICD-10-CM | POA: Insufficient documentation

## 2016-04-02 DIAGNOSIS — Z6841 Body Mass Index (BMI) 40.0 and over, adult: Secondary | ICD-10-CM | POA: Diagnosis not present

## 2016-04-02 DIAGNOSIS — I130 Hypertensive heart and chronic kidney disease with heart failure and stage 1 through stage 4 chronic kidney disease, or unspecified chronic kidney disease: Secondary | ICD-10-CM | POA: Diagnosis not present

## 2016-04-02 DIAGNOSIS — G4730 Sleep apnea, unspecified: Secondary | ICD-10-CM | POA: Insufficient documentation

## 2016-04-02 DIAGNOSIS — I5042 Chronic combined systolic (congestive) and diastolic (congestive) heart failure: Secondary | ICD-10-CM | POA: Diagnosis not present

## 2016-04-02 DIAGNOSIS — N182 Chronic kidney disease, stage 2 (mild): Secondary | ICD-10-CM | POA: Diagnosis not present

## 2016-04-02 DIAGNOSIS — E1140 Type 2 diabetes mellitus with diabetic neuropathy, unspecified: Secondary | ICD-10-CM | POA: Diagnosis not present

## 2016-04-02 DIAGNOSIS — I87332 Chronic venous hypertension (idiopathic) with ulcer and inflammation of left lower extremity: Secondary | ICD-10-CM | POA: Diagnosis present

## 2016-04-02 DIAGNOSIS — E1122 Type 2 diabetes mellitus with diabetic chronic kidney disease: Secondary | ICD-10-CM | POA: Diagnosis not present

## 2016-04-02 DIAGNOSIS — Z7984 Long term (current) use of oral hypoglycemic drugs: Secondary | ICD-10-CM | POA: Diagnosis not present

## 2016-04-09 DIAGNOSIS — I87332 Chronic venous hypertension (idiopathic) with ulcer and inflammation of left lower extremity: Secondary | ICD-10-CM | POA: Diagnosis not present

## 2016-04-16 DIAGNOSIS — I87332 Chronic venous hypertension (idiopathic) with ulcer and inflammation of left lower extremity: Secondary | ICD-10-CM | POA: Diagnosis not present

## 2016-04-25 ENCOUNTER — Ambulatory Visit (INDEPENDENT_AMBULATORY_CARE_PROVIDER_SITE_OTHER): Payer: Medicare Other | Admitting: Neurology

## 2016-04-25 DIAGNOSIS — G4733 Obstructive sleep apnea (adult) (pediatric): Secondary | ICD-10-CM

## 2016-05-06 ENCOUNTER — Telehealth: Payer: Self-pay | Admitting: Neurology

## 2016-05-06 DIAGNOSIS — G4733 Obstructive sleep apnea (adult) (pediatric): Secondary | ICD-10-CM

## 2016-05-06 NOTE — Progress Notes (Signed)
PATIENT'S NAME:  Maurice Nguyen, Krayton DOB:      12/12/1965      MR#:    [id number]     DATE OF RECORDING: 04/25/2016 REFERRING M.D.:  Tracey Harriesavid Bouska, MD Study Performed:  Split-Night Titration Study HISTORY: 50 year old right-handed gentleman with an underlying complex medical history of A. fib, on Xarelto, type 2 diabetes, combined systolic and diastolic heart failure, chronic kidney failure, hypertension, morbid obesity, chronic back pain, recent admission to the hospital for community-acquired pneumonia, who reports snoring and excessive daytime somnolence.The patient endorsed the Epworth Sleepiness Scale at 6/24 points. The patient's weight 421 pounds with a height of 74 (inches), resulting in a BMI of 54. kg/m2. The patient's neck circumference measured 20.5 inches.  CURRENT MEDICATIONS: Carvedilol, Glipizide, Metformin, Nitroglycerin, Omeprazole, Oxycodone, Potassium Chloride, Rivaroxaban, Rivaroxaban, Sitagliptin, Sucralfate, Torsemide and Valsartan    PROCEDURE:  This is a multichannel digital polysomnogram utilizing the Somnostar 11.2 system.  Electrodes and sensors were applied and monitored per AASM Specifications.   EEG, EOG, Chin and Limb EMG, were sampled at 200 Hz.  ECG, Snore and Nasal Pressure, Thermal Airflow, Respiratory Effort, CPAP Flow and Pressure, Oximetry was sampled at 50 Hz. Digital video and audio were recorded.      BASELINE STUDY WITHOUT CPAP RESULTS:  Lights Out was at 22:27 and Lights On at 01:37 AM.  Total recording time (TRT) was 189, with a total sleep time (TST) of 134 minutes.   The patient's sleep latency was 16.5 minutes.  REM latency was 0 minutes.  The sleep efficiency was 70.9 %.    SLEEP ARCHITECTURE: There was moderate sleep fragmentation, Stage N1 13.4%, Stage N2 86.6%, Stage N3 0% and Stage R (REM sleep) 0%.   RESPIRATORY ANALYSIS:  There was a total of 91 respiratory events:  8 obstructive apneas, 0 central apneas and0 mixed apneas with a total of 8 apneas  and an apnea index (AI) of 3.6. There were 83 hypopneas with a hypopnea index of 37.2. The patient also had 0 respiratory event related arousals (RERAs).      The total APNEA/HYPOPNEA INDEX (AHI) was 40.7 and the total RESPIRATORY DISTURBANCE INDEX was 40.7.  0 events occurred in REM sleep and 166 events in NREM. The REM AHI was 0, versus a non-REM AHI of 40.7. The patient spent 13% of total sleep time in the supine position. The supine AHI was 33.4 versus a non-supine AHI of 41.9.  OXYGEN SATURATION & C02:  The baseline 02 saturation was 96%, with the lowest being 84%. Time spent below 89% saturation equaled 26 minutes.   PERIODIC LIMB MOVEMENTS: (Baseline)   The patient had a total of 5 Periodic Limb Movements.  The Periodic Limb Movement (PLM) index was 2.2 and the PLM Arousal index was 0.   TITRATION STUDY WITH CPAP RESULTS:  Patient was fitted with a large ResMed Airfit F20 FFM (patient preference, due to mouth breathing).   CPAP was initiated at 6 cmH20 with heated humidity per AASM split night standards and pressure was advanced to 10 cmH20 because of hypopneas, apneas and desaturations.  At a PAP pressure of 10 cmH20, there was a reduction of the AHI to 0 per hour. REM sleep was achieved on a pressure of 9 cm.    Lights Out was at 01:37 AM and Lights On at 05:11. Total recording time (TRT) was 215 minutes, with a total sleep time (TST) of 146.5 minutes. The patient's sleep latency was 52.5 minutes with 35 minutes of wake time  after sleep onset. REM latency was 33 minutes.  The sleep efficiency was 68.1 %.    SLEEP ARCHITECTURE: There was moderate sleep fragmentation noted, Stage N1 9.2%, Stage N2 80.2%, Stage N3 0% and Stage R (REM sleep) 10.6%.   RESPIRATORY ANALYSIS:  There was a total of 1 respiratory events: 0 obstructive apneas, 0 central apneas and 0 mixed apneas with a total of 0 apneas and an apnea index (AI) of 0. There were 1 hypopneas with a hypopnea index of .4. The patient  also had 0 respiratory event related arousals (RERAs).      The total APNEA/HYPOPNEA INDEX  (AHI) was .4 and the total RESPIRATORY DISTURBANCE INDEX was .4.  0 events occurred in REM sleep and 1 events in NREM. The REM AHI was 0, versus a non-REM AHI of .5. The patient spent 12% of total sleep time in the supine position. The supine AHI was 0.0, versus a non-supine AHI of 0.5.  OXYGEN SATURATION & C02:  The baseline 02 saturation was 96%, with the lowest being 90%. Time spent below 89% saturation equaled 0 minutes.  PERIODIC LIMB MOVEMENTS: (Baseline)   The patient had a total of 6 Periodic Limb Movements. The Periodic Limb Movement (PLM) index was 2.5 and the PLM Arousal index was .8    POLYSOMNOGRAPHY IMPRESSION :   1. Obstructive sleep apnea.   RECOMMENDATIONS:  This patient has severe OSA. Of note, the absence of REM sleep during the baseline portion of the study may underestimate the AHI and desaturation nadir. He did well with CPAP of 10 cm. I will, therefore, prescribe home CPAP therapy at a pressure of 10 cm via large FFM. He should be reminded to be fully compliant with PAP therapy.  The patient should be cautioned not to drive, work at heights, or operate dangerous or heavy equipment when tired or sleepy. Review and reiteration of good sleep hygiene measures should be pursued with any patient. Please note that untreated obstructive sleep apnea carries additional perioperative morbidity. Patients with significant obstructive sleep apnea should receive perioperative PAP therapy and the surgeons and particularly the anesthesiologist should be informed of the diagnosis and the severity of the sleep disordered breathing.   A follow up appointment will be scheduled in the Sleep Clinic at [facility]. The referring provider will be notified of the results.    I certify that I have reviewed the entire raw data recording prior to the issuance of this report in accordance  with the Standards of Accreditation of the American Academy of Sleep Medicine (AASM)       Huston Foley, MD, PhD Diplomat, American Board of Psychiatry and Neurology  Diplomat, American Board of Sleep Medicine

## 2016-05-06 NOTE — Telephone Encounter (Signed)
Maurice Nguyen:  Patient referred by Dr. Everlene Other, seen by me on 03/22/16, split study on 04/25/16. Please call and notify patient that the recent sleep study confirmed the diagnosis of severe OSA. He did well with CPAP during the study with improvement of the respiratory events. Therefore, I would like start the patient on CPAP therapy at home by prescribing a machine for home use. I placed the order in the chart. The patient will need a follow up appointment with me in 8 to 10 weeks post set up that has to be scheduled; please go ahead and schedule while you have the patient on the phone and make sure patient understands the importance of keeping this window for the FU appointment, as it is often an insurance requirement and failing to adhere to this may result in losing coverage for sleep apnea treatment.  Please re-enforce the importance of compliance with treatment and the need for [place] to monitor compliance data - again an insurance requirement and good feedback for the patient as far as how they are doing.  Also remind patient, that any upcoming CPAP machine or mask issues, should be first addressed with the DME company. Please ask if patient has a preference regarding DME company.  Please arrange for CPAP set up at home through a DME company of patient's choice - once you have spoken to the patient - and faxed/routed report to PCP and referring MD (if other than PCP), you can close this encounter, thanks,   Huston Foley, MD, PhD [facility] ([facility])

## 2016-05-10 NOTE — Telephone Encounter (Signed)
LM for patient to call back for results

## 2016-05-19 ENCOUNTER — Telehealth: Payer: Self-pay | Admitting: Neurology

## 2016-05-19 NOTE — Telephone Encounter (Signed)
Patient called back. I gave him results and recommendations. He agreed to start treatment. Referral has been sent to [facility]. I have sent report to PCP. Patient will receive a letter that reminds him to make f/u appt and stress the importance of compliance.

## 2016-05-19 NOTE — Telephone Encounter (Signed)
Patient called wanting his results from his sleep study that was completed a few weeks ago.  Please contact him at his mobile number.

## 2016-05-19 NOTE — Telephone Encounter (Signed)
Patient called.

## 2016-05-19 NOTE — Telephone Encounter (Signed)
See other phone note. I left message for patient to call back 9 days ago. I will call back

## 2016-05-20 ENCOUNTER — Other Ambulatory Visit: Payer: Self-pay | Admitting: Cardiovascular Disease

## 2016-05-24 ENCOUNTER — Ambulatory Visit: Payer: Medicare Other | Admitting: Physician Assistant

## 2016-05-24 ENCOUNTER — Ambulatory Visit: Payer: Medicare Other | Admitting: Internal Medicine

## 2016-05-27 ENCOUNTER — Telehealth: Payer: Self-pay | Admitting: Neurology

## 2016-05-27 NOTE — Telephone Encounter (Signed)
I spoke to patient and verified information below. He called [facility] and they would not take him without those things either. I have emailed [facility] to see if they can help.

## 2016-05-27 NOTE — Telephone Encounter (Signed)
Patient called because he can't get his machine through [facility] because he doesn't have a credit card or checking account.  He needs to know what to do because of his sleep apnea.  Please call the patient.

## 2016-05-27 NOTE — Telephone Encounter (Signed)
[facility] states that since his insurance should cover him, he does not need cc or back account. Patient has been notified and I sent the orders to [facility].

## 2016-10-12 ENCOUNTER — Telehealth: Payer: Self-pay | Admitting: Nurse Practitioner

## 2016-10-12 ENCOUNTER — Telehealth: Payer: Self-pay

## 2016-10-12 NOTE — Telephone Encounter (Signed)
Called for recent download. Will fax.

## 2016-10-12 NOTE — Telephone Encounter (Signed)
*  [facility] 

## 2016-10-12 NOTE — Telephone Encounter (Signed)
Patient called back and made appt for this Friday. He is aware to bring his CPAP machine.  See note below. Patient needs face to face visit for insurance reasons. After appt I will fax notes to [facility]

## 2016-10-12 NOTE — Telephone Encounter (Signed)
FYI  This patient will be seeing Eber Jones this Friday, 1st CPAP appt. Dr Orland Penman be in the office but she should be available.  FYI

## 2016-10-12 NOTE — Telephone Encounter (Signed)
[facility] sent me this message:  Maurice Nguyen,  I see that he has an appointment June this year.  He was set up on Cpap 06/02/2016 and Medicare requires these notes in order to bill past the first three months.  Has he canceled his follow up appointment? Could we try to get him in sooner for his Cpap follow up?  Please let me know and thank you for your help :)    Thank you so much!  Vickie Epley, CSR [facility] [employer] [address] [place], [place] [address]

## 2016-10-12 NOTE — Telephone Encounter (Signed)
I called and left the patient a message advising that the needs a sooner appt per insurance requirements for CPAP. Left call back number

## 2016-10-13 NOTE — Telephone Encounter (Signed)
Received.  Gave to Dr. Frances Furbish to review.

## 2016-10-15 ENCOUNTER — Ambulatory Visit: Payer: Medicare Other | Admitting: Nurse Practitioner

## 2016-10-19 ENCOUNTER — Encounter: Payer: Self-pay | Admitting: Nurse Practitioner

## 2016-10-20 NOTE — Telephone Encounter (Signed)
Patient did not show to his appointment. I received this email from [facility] today:  Maurice Nguyen,  We are going to pick his Cpap up.  I just spoke with him and he is aware that he will have to follow up with you guys and requalify to start Cpap again.

## 2016-11-05 ENCOUNTER — Ambulatory Visit: Payer: Medicare Other | Admitting: Nurse Practitioner

## 2016-11-11 ENCOUNTER — Encounter: Payer: Self-pay | Admitting: Nurse Practitioner

## 2016-11-11 ENCOUNTER — Ambulatory Visit (INDEPENDENT_AMBULATORY_CARE_PROVIDER_SITE_OTHER): Payer: Medicare Other | Admitting: Nurse Practitioner

## 2016-11-11 ENCOUNTER — Encounter (INDEPENDENT_AMBULATORY_CARE_PROVIDER_SITE_OTHER): Payer: Self-pay

## 2016-11-11 ENCOUNTER — Telehealth: Payer: Self-pay

## 2016-11-11 VITALS — BP 123/84 | HR 79 | Wt >= 6400 oz

## 2016-11-11 DIAGNOSIS — G4733 Obstructive sleep apnea (adult) (pediatric): Secondary | ICD-10-CM

## 2016-11-11 NOTE — Telephone Encounter (Signed)
I contacted Heather with [facility] to ask if patient came by. Her response at 2:34 pm was:  Thank you Lafonda Mosses,  He has not came by as of yet.    Thank you so much!  Vickie Epley, CSR [facility] [employer] [address] [place], [place] [address]

## 2016-11-11 NOTE — Progress Notes (Addendum)
[facility]  PATIENT: Maurice Nguyen DOB: [birth year]   REASON FOR VISIT: Follow-up for initial CPAP follow-up HISTORY FROM: Patient and girlfriend    HISTORY OF PRESENT ILLNESS:UPDATE 04/19/2018CM Maurice Nguyen, returns for follow-up CPAP compliance. He gets his machine from [facility] , according to [place] at [employer], he has not used his machine since January and there is no [employer] account planner. They're requesting that he bring his machine in essentially  will need another study to requalify. Patient is adamant that he is using her machine every day. He occasionally naps during the day.ESS0  He returns for reevaluation    HISTORY 03/22/16 [name] is a 51 year old right-handed gentleman with an underlying complex medical history of A. fib, on Xarelto, type 2 diabetes, combined systolic and diastolic heart failure, chronic kidney failure, hypertension, morbid obesity, chronic back pain, recent admission to the hospital for community-acquired pneumonia, who reports snoring and excessive daytime somnolence. I reviewed your office note from 03/10/2016, which you kindly included. I also reviewed his hospital records from March. He was admitted on 10/01/2015 and discharged on 10/04/2015. He was admitted with cough, hematemesis, and shortness of breath. He was diagnosed with community-acquired pneumonia and sepsis and was treated with IV antibiotics. He was seen by gastroenterology for hematemesis and was advised to take his PPI. He was also advised to follow-up as an outpatient with GI. His Epworth sleepiness score is 6 out of 24 today, his fatigue score is 38/63. He denies morning headaches or nocturia. He has RLS symptoms and Maurice Nguyen reports, he kicks and moves his legs in his sleep. He has a cousin with OSA, on CPAP. He takes oxycodone once or 2 times a day.  He has been sleeping upright, in the chair or recliner, and has been doing this for well over a year, he  estimates. Sleeps in the den with the TV on all night.  He does not keep a sleep schedule, generally goes to bed after midnight, wakeup time varies, depending on how much sleep he got and when he went to bed. He is a nonsmoker, does not drink alcohol, denies illicit drug use and does not drink caffeine on a daily basis. He does not take his diuretic daily, generally 3-4 times a week.  He snores, this can be loud, depending on his sleep position, and he has woken up with a sense of gasping for air. His sleep is very interrupted, he has multiple nighttime awakenings.  REVIEW OF SYSTEMS: Full 14 system review of systems performed and notable only for those listed, all others are neg:  Constitutional: neg  Cardiovascular: neg Ear/Nose/Throat: neg  Skin: neg Eyes: neg Respiratory: Shortness of breath Gastroitestinal: neg  Hematology/Lymphatic: neg  Endocrine: neg Musculoskeletal: Walking difficulty Allergy/Immunology: neg Neurological: neg Psychiatric: neg Sleep : Frequent wakening   ALLERGIES: No Known Allergies  HOME MEDICATIONS: Outpatient Medications Prior to Visit  Medication Sig Dispense Refill  . carvedilol (COREG) 6.25 MG tablet Take 1 tablet (6.25 mg total) by mouth 2 (two) times daily. 60 tablet 11  . glipiZIDE (GLUCOTROL) 10 MG tablet Take 1 tablet (10 mg total) by mouth 2 (two) times daily before a meal. 60 tablet 1  . metFORMIN (GLUCOPHAGE) 1000 MG tablet take 1 tablet by mouth twice a day with meals    . nitroGLYCERIN (NITROSTAT) 0.4 MG SL tablet Place 1 tablet (0.4 mg total) under the tongue every 5 (five) minutes x 3 doses as needed for chest pain. 30 tablet  12  . omeprazole (PRILOSEC) 20 MG capsule Take 1 capsule (20 mg total) by mouth daily. For 1 month 30 capsule 0  . oxyCODONE-acetaminophen (PERCOCET/ROXICET) 5-325 MG tablet Take 1 tablet by mouth every 6 (six) hours as needed for moderate pain. 15 tablet 0  . potassium chloride SA (K-DUR,KLOR-CON) 20 MEQ tablet TAKE 2  TABLETS BY MOUTH EVERY MORNING 60 tablet 3  . rivaroxaban (XARELTO) 20 MG TABS tablet Take 20 mg by mouth.    . sitaGLIPtin (JANUVIA) 100 MG tablet Take 100 mg by mouth.    . torsemide (DEMADEX) 20 MG tablet TAKE 2 TABLETS BY MOUTH ONCE DAILY 60 tablet 3  . valsartan (DIOVAN) 80 MG tablet Take 1 tablet (80 mg total) by mouth daily. 31 tablet 11  . metFORMIN (GLUCOPHAGE) 1000 MG tablet Take 1 tablet (1,000 mg total) by mouth 2 (two) times daily with a meal. 60 tablet 1  . cephALEXin (KEFLEX) 500 MG capsule Take 1 capsule (500 mg total) by mouth 4 (four) times daily. 20 capsule 0  . rivaroxaban (XARELTO) 20 MG TABS tablet Take 1 tablet (20 mg total) by mouth daily with supper. 30 tablet 1  . sucralfate (CARAFATE) 1 g tablet Take 1 tablet (1 g total) by mouth 4 (four) times daily -  with meals and at bedtime. For 10 days 40 tablet 0   No facility-administered medications prior to visit.     PAST MEDICAL HISTORY: Past Medical History:  Diagnosis Date  . A-fib (HCC)   . Anticoagulated, on Xarelto, Chads2Vas score 2 10/02/2013  . Back pain, chronic 07/13/2012  . CHF (congestive heart failure) (HCC)   . 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 CKD (chronic kidney disease), stage II   . Complication of anesthesia    " THEY LOST ME AND BROUGHT ME BACK "  . Hypertension 07/13/2012  . Irregular heart beat   . Lumbar herniated disc   . Morbid obesity (HCC)   . Obesity, morbid, BMI 50 or higher (HCC) 07/13/2012  . 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 Shortness of breath   . Type II diabetes mellitus (HCC) 07/13/2012    PAST SURGICAL HISTORY: Past Surgical History:  Procedure Laterality Date  . ABSCESS DRAINAGE    . COLONOSCOPY  08/22/2012   Procedure: COLONOSCOPY;  Surgeon: Shirley Friar, MD;  Location: [facility];  Service: Endoscopy;  Laterality: N/A;  . DENTAL SURGERY      FAMILY HISTORY: History reviewed. No pertinent family history.  SOCIAL  HISTORY: Social History   Social History  . Marital status: Single    Spouse name: N/A  . Number of children: N/A  . Years of education: N/A   Occupational History  . N/A    Social History Main Topics  . Smoking status: Never Smoker  . Smokeless tobacco: Never Used  . Alcohol use No     Comment: rarely  . Drug use: No  . Sexual activity: Yes   Other Topics Concern  . Not on file   Social History Narrative   Denies caffeine use      PHYSICAL EXAM  Vitals:   11/11/16 0847  BP: 123/84  Pulse: 79  Weight: (!) 425 lb (192.8 kg)   Body mass index is 54.57 kg/m.  Generalized: Well developed, Morbidly obese in no acute distress  Head: normocephalic and atraumatic,. Oropharynx benign  Neck: Supple, no carotid bruits  Cardiac: Regular rate rhythm, no murmur  Musculoskeletal:  No deformity  Extremities 2+ pitting edema in the lower extremities distally  Neurological examination   Mentation: Alert oriented to time, place, history taking. Attention span and concentration appropriate. Recent and remote memory intact.  Follows all commands speech and language fluent.   Cranial nerve II-XII: Fundoscopic exam reveals sharp disc margins.Pupils were equal round reactive to light extraocular movements were full, visual field were full on confrontational test. Facial sensation and strength were normal. hearing was intact to finger rubbing bilaterally. Uvula tongue midline. head turning and shoulder shrug were normal and symmetric.Tongue protrusion into cheek strength was normal. Motor: normal bulk and tone, full strength in the BUE, BLE, fine finger movements normal, no pronator drift. No focal weakness Sensory: normal and symmetric to light touch, and the upper and lower extremities Coordination: finger-nose-finger, normal heel-to-shin unable to perform  Reflexes: Diminished throughout plantar responses were flexor bilaterally. Gait and Station: Rising up from seated position  without assistance, cautious slow steady gait, tandem walk is not possible  DIAGNOSTIC DATA (LABS, IMAGING, TESTING) - I reviewed patient records, labs, notes, testing and imaging myself where available.  Lab Results  Component Value Date   WBC 8.2 10/04/2015   HGB 13.6 10/04/2015   HCT 40.9 10/04/2015   MCV 92.5 10/04/2015   PLT 188 10/04/2015      Component Value Date/Time   NA 136 10/04/2015 0711   K 4.8 10/04/2015 0711   CL 104 10/04/2015 0711   CO2 21 (L) 10/04/2015 0711   GLUCOSE 198 (H) 10/04/2015 0711   BUN 15 10/04/2015 0711   CREATININE 0.93 10/04/2015 0711   CALCIUM 8.3 (L) 10/04/2015 0711   PROT 7.3 10/01/2015 1440   ALBUMIN 2.8 (L) 10/01/2015 1440   AST 18 10/01/2015 1440   ALT 11 (L) 10/01/2015 1440   ALKPHOS 56 10/01/2015 1440   BILITOT 2.5 (H) 10/01/2015 1440   GFRNONAA >60 10/04/2015 0711   GFRAA >60 10/04/2015 0711    ASSESSMENT AND PLAN  51 y.o. year old male  has a past medical history of A-fib (HCC); Anticoagulated, on Xarelto, type 2 diabetes, combined systolic and diastolic heart failure, chronic kidney failure, hypertension, morbid obesity, chronic back pain, history and physical exam obstructive sleep apnea. Patient has been noncompliant with his CPAP machine    [facility] Will repeat sleep study if necessary  Follow up with Dr. Frances Furbish I explained in particular the risks and ramifications of untreated moderate to severe OSA, especially with respect to cardiovascular disease  including congestive heart failure, difficult to treat hypertension, cardiac arrhythmias, or stroke. Even type 2 diabetes has, in part, been linked to untreated OSA. Symptoms of untreated OSA include daytime sleepiness, memory problems, mood irritability and mood disorder such as depression and anxiety, lack of energy, as well as recurrent headaches, especially morning headaches. We talked about trying to maintain a healthy lifestyle in general, as well as the  importance of weight control. I encouraged the patient to eat healthy, exercise daily and keep well hydrated, to keep a scheduled bedtime and wake time routine, to not skip any meals and eat healthy snacks in between meals I spent 15 min  in total face to face time with the patient more than 50% of which was spent counseling and coordination of care, reviewing test results reviewing medications and discussing and reviewing the diagnosis of OSA and importance of compliance with treatment plan  Nilda Riggs, [facility], APRN  [facility] [address]  [place], [place] [address] [phone]  I reviewed the above note and documentation by the Nurse Practitioner and agree with the history, physical exam, assessment and plan as outlined above. I was immediately available for face-to-face consultation. Star Age, MD, PhD [facility])

## 2016-11-11 NOTE — Patient Instructions (Addendum)
[facility] Will repeat sleep study if necessary Follow up with Dr. Frances Furbish

## 2016-11-17 ENCOUNTER — Encounter: Payer: Self-pay | Admitting: Podiatry

## 2016-11-17 ENCOUNTER — Ambulatory Visit (INDEPENDENT_AMBULATORY_CARE_PROVIDER_SITE_OTHER): Payer: Medicare Other | Admitting: Podiatry

## 2016-11-17 DIAGNOSIS — E1149 Type 2 diabetes mellitus with other diabetic neurological complication: Secondary | ICD-10-CM

## 2016-11-17 DIAGNOSIS — M2142 Flat foot [pes planus] (acquired), left foot: Secondary | ICD-10-CM

## 2016-11-17 DIAGNOSIS — M2141 Flat foot [pes planus] (acquired), right foot: Secondary | ICD-10-CM

## 2016-11-17 NOTE — Progress Notes (Signed)
   Subjective:    Patient ID: Maurice Nguyen, male    DOB: 09/21/1965, 51 y.o.   MRN: [id number]  HPI this patient presents the office for examination of his feet. This patient states he is diabetic on both medicine and insulin. He says that he has previously had diabetic shoes and requests new diabetic shoes today. He presents the office today for the foot exam to be evaluated for diabetic shoes    Review of Systems  All other systems reviewed and are negative.      Objective:   Physical Exam GENERAL APPEARANCE: Alert, conversant. Appropriately groomed. No acute distress.  VASCULAR: Pedal pulses are  Not  palpable at  [facility] and PT bilateral.  Capillary refill time is immediate to all digits,  Normal temperature gradient.  Cold right foot.  NEUROLOGIC: sensation is absent  to 5.07 monofilament at 5/5 sites bilateral.   Muscle strength normal.  MUSCULOSKELETAL: acceptable muscle strength, tone and stability bilateral.  Intrinsic muscluature intact bilateral.  Rectus appearance of foot and digits noted bilateral. Pes planus foot type.  DERMATOLOGIC: skin color, texture, and turgor are within normal limits.  No preulcerative lesions or ulcers  are seen, no interdigital maceration noted.  No open lesions present.   No drainage noted   NAILS  THick disfigured discolored nails both feet.         Assessment & Plan:  Diabetes with angiopathy and neuropathy.  Pes planus.  IE  Diabetic foot exam does reveal evidence of angiopathy and neuropathy. This patient is a candidate for diabetic shoes. He is to return the office in 2 days for an appointment with [facility]  for casting for diabetic shoes. Shoes   Helane Gunther DPM

## 2016-11-18 NOTE — Telephone Encounter (Signed)
Heather from [employer].     I did get in touch with Maurice Nguyen at the end of yesterday and my apologies for just now letting you know.  He has agreed to a new sleep study and being sure to keep his appointments so that we can qualify him for the Cpap.  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

## 2016-11-19 ENCOUNTER — Other Ambulatory Visit: Payer: Medicare Other

## 2016-12-16 ENCOUNTER — Telehealth: Payer: Self-pay | Admitting: Neurology

## 2016-12-16 DIAGNOSIS — Z789 Other specified health status: Secondary | ICD-10-CM

## 2016-12-16 DIAGNOSIS — G4733 Obstructive sleep apnea (adult) (pediatric): Secondary | ICD-10-CM

## 2016-12-16 NOTE — Telephone Encounter (Signed)
Patient called office in reference to appointment next week with Dr. Frances Furbish patient states he was just here in April and isn't sure why he needs to come back already.  I wasn't sure if it was okay to cancel and would like to check with you and Dr. Frances Furbish first.  Please call

## 2016-12-16 NOTE — Telephone Encounter (Signed)
Im checking with Heather at [facility] to see if we need new sleep study or not. Otherwise we can push out appt.

## 2016-12-21 ENCOUNTER — Telehealth: Payer: Self-pay | Admitting: *Deleted

## 2016-12-21 NOTE — Telephone Encounter (Signed)
Left message for patient that diabetic shoes are in and to call the office to schedule an appointment to pick them up   auth exp 02/26/17

## 2016-12-22 NOTE — Telephone Encounter (Signed)
I spoke to patient and cancelled appt. I advised him that he needs repeat sleep study to re qualify for CPAP and we are trying to figure out what kind he needs per insurance. Patient voiced understanding. F/u appt cancelled.

## 2016-12-22 NOTE — Telephone Encounter (Signed)
Message from Boyden at [employer]:  Linward Headland will need to repeat the sleep study,  too much time passed between compliance and his follow up appointment.  ( I tried to get it to pass) He does have a loaner from [place] to use until the process of requalifying is complete which should make meeting and maintaining compliance easier this time around.     Ok to cancel f/u and order repeat study?

## 2016-12-22 NOTE — Telephone Encounter (Signed)
I sent [name] another message to check on this.

## 2016-12-22 NOTE — Addendum Note (Signed)
Addended by: Huston Foley on: 12/22/2016 12:51 PM   Modules accepted: Orders

## 2016-12-22 NOTE — Telephone Encounter (Signed)
I will order cpap titration study to re-qualify patient.

## 2016-12-22 NOTE — Telephone Encounter (Signed)
I spoke with Heather at [facility], she suggests a split study

## 2016-12-23 ENCOUNTER — Ambulatory Visit: Payer: Self-pay | Admitting: Neurology

## 2016-12-23 NOTE — Telephone Encounter (Signed)
Will need to order split, not cpap titration per ins requirement. Ordered split, will need to cancel order for cpap titration.  Sorry for the faux pas.

## 2016-12-23 NOTE — Addendum Note (Signed)
Addended by: Huston Foley on: 12/23/2016 10:50 AM   Modules accepted: Orders

## 2016-12-23 NOTE — Telephone Encounter (Signed)
Per insurance he will need split study.

## 2016-12-27 ENCOUNTER — Ambulatory Visit (INDEPENDENT_AMBULATORY_CARE_PROVIDER_SITE_OTHER): Payer: Medicare Other | Admitting: Podiatry

## 2016-12-27 DIAGNOSIS — M2142 Flat foot [pes planus] (acquired), left foot: Secondary | ICD-10-CM | POA: Diagnosis not present

## 2016-12-27 DIAGNOSIS — M2141 Flat foot [pes planus] (acquired), right foot: Secondary | ICD-10-CM | POA: Diagnosis not present

## 2016-12-27 DIAGNOSIS — E1149 Type 2 diabetes mellitus with other diabetic neurological complication: Secondary | ICD-10-CM | POA: Diagnosis not present

## 2016-12-28 NOTE — Progress Notes (Signed)
Patient came in today to pick up diabetic shoes and custom inserts.  Same was well pleased with fit and function.   The foot ortheses offered full contact with plantar surface and contoured the arch well.   The shoes fit well with no heel slippage and areas of pressure concern.   Patient advised to contact us if any problems arise.  Patient presents to the office for pick up of diabetic shoes.  Diabetic neuropathy  Pes planus   Patient was dispensed diabetic shoes. Patient presents today and was dispensed 0ne pair ( two units) of medically necessary extra depth shoes with three pair( six units) of custom molded multiple density inserts. The shoes and the inserts are fitted to the patients ' feet and are noted to fit well and are free of defect.  Length and width of the shoes are also acceptable.  Patient was given written and verbal  instructions for wearing.  If any concerns arrive with the shoes or inserts, the patient is to call the office.Patient is to follow up with doctor in six weeks.   Helane Gunther DPM

## 2017-01-17 ENCOUNTER — Ambulatory Visit (INDEPENDENT_AMBULATORY_CARE_PROVIDER_SITE_OTHER): Payer: Medicare Other | Admitting: Neurology

## 2017-01-17 DIAGNOSIS — G4733 Obstructive sleep apnea (adult) (pediatric): Secondary | ICD-10-CM

## 2017-01-17 DIAGNOSIS — R9431 Abnormal electrocardiogram [ECG] [EKG]: Secondary | ICD-10-CM

## 2017-01-17 DIAGNOSIS — G4720 Circadian rhythm sleep disorder, unspecified type: Secondary | ICD-10-CM

## 2017-01-27 ENCOUNTER — Telehealth: Payer: Self-pay

## 2017-01-27 NOTE — Telephone Encounter (Signed)
Patient called office returning RN's call.  Please call °

## 2017-01-27 NOTE — Telephone Encounter (Signed)
I called pt to discuss his sleep study results. No answer, left a message asking him to call me back. 

## 2017-01-27 NOTE — Procedures (Signed)
PATIENT'S NAME:  Maurice Nguyen, Maurice Nguyen DOB:      1965/11/09      MR#:    [id number]     DATE OF RECORDING: 01/17/2017 REFERRING M.D.:  Tracey Harries, MD Study Performed:  Split-Night Titration Study HISTORY: 51 year old man with a history of A. fib, on Xarelto, type 2 diabetes, combined systolic and diastolic heart failure, chronic kidney failure, hypertension, morbid obesity, chronic back pain, recent CAP with hospital stay, who presents for re-evaluation of his OSA. The patient endorsed the Epworth Sleepiness Scale at 6/24 points. The patient's weight 425 pounds with a height of 74 (inches), resulting in a BMI of 54.6 kg/m2. The patient's neck circumference measured 20 inches.  CURRENT MEDICATIONS: Carvedilol, Glipizide, Metformin, Nitroglycerin, Omeprazole, Oxycodone, Potassium Chloride, Rivaroxaban, Rivaroxaban, Sitagliptin, Sucralfate, Torsemide and Valsartan  PROCEDURE:  This is a multichannel digital polysomnogram utilizing the Somnostar 11.2 system.  Electrodes and sensors were applied and monitored per AASM Specifications.   EEG, EOG, Chin and Limb EMG, were sampled at 200 Hz.  ECG, Snore and Nasal Pressure, Thermal Airflow, Respiratory Effort, CPAP Flow and Pressure, Oximetry was sampled at 50 Hz. Digital video and audio were recorded.      BASELINE STUDY WITHOUT CPAP RESULTS:  Lights Out was at 21:12 and Lights On at 03:55 for the night; study was ended earlier than anticipated per patient request, as he had an accident with his bowels. Total recording time (TRT) was 151, with a total sleep time (TST) of 124 minutes.   The patient's sleep latency was 10.5 minutes. REM latency was 103.5 minutes.  The sleep efficiency was 82.1 %.    SLEEP ARCHITECTURE: WASO (Wake after sleep onset) was 21.5 minutes,  with moderate sleep fragmentation noted. Stage N1 was 19.5 minutes, Stage N2 was 98.5 minutes, Stage N3 was 0 minutes and Stage R (REM sleep) was 6 minutes.  The percentages were Stage N1 15.7%, Stage N2  79.4%, both increased, Stage N3 was absent, and Stage R (REM sleep) 4.8%.  The arousals were noted as: 28 were spontaneous, 0 were associated with PLMs, 43 were associated with respiratory events.   Audio and video analysis did not show any abnormal or unusual movements, behaviors, phonations or vocalizations.  The patient took 2 bathroom breaks for the night. Moderate snoring was noted. The EKG was irregular, in keeping with A fib and intermittent PVCs.  RESPIRATORY ANALYSIS:  There were a total of 99 respiratory events:  0 obstructive apneas, 2 central apneas and 0 mixed apneas with a total of 2 apneas and an apnea index (AI) of 1.. There were 97 hypopneas with a hypopnea index of 46.9. The patient also had 0 respiratory event related arousals (RERAs).  Snoring was noted.     The total APNEA/HYPOPNEA INDEX (AHI) was 47.9 /hour and the total RESPIRATORY DISTURBANCE INDEX was 47.9 /hour.  1 events occurred in REM sleep and 194 events in NREM. The REM AHI was 10, /hour versus a non-REM AHI of 49.8 /hour. The patient spent 144 minutes sleep time in the supine position 168 minutes in non-supine. The supine AHI was 36.2 /hour versus a non-supine AHI of 51.0 /hour.  OXYGEN SATURATION & C02:  The wake baseline 02 saturation was 95%, with the lowest being 84%. Time spent below 89% saturation equaled 46 minutes.  PERIODIC LIMB MOVEMENTS: The patient had a total of 0 Periodic Limb Movements.  The Periodic Limb Movement (PLM) index was 0 /hour and the PLM Arousal index was 0 /hour.  TITRATION  STUDY WITH CPAP RESULTS:   The patient was fitted with large P10 nasal pillows. CPAP was initiated at 6 cmH20 with heated humidity per AASM split night standards and pressure was advanced to 12 cmH20 because of hypopneas, apneas and desaturations.  At a PAP pressure of 12 cmH20, there was a reduction of the AHI to 0/hour with O2 nadir of 94% and supine NREM sleep achieved.   Total recording time (TRT) was 252.5 minutes,  with a total sleep time (TST) of 188 minutes. The patient's sleep latency was 5 minutes. REM latency was 25 minutes.  The sleep efficiency was 74.5 %.    SLEEP ARCHITECTURE: Wake after sleep was 46 minutes, Stage N1 30.5 minutes, Stage N2 123 minutes, Stage N3 0 minutes and Stage R (REM sleep) 34.5 minutes. The percentages were: Stage N1 16.2%, Stage N2 65.4%, Stage N3 was absent, and Stage R (REM sleep) 18.4%.  The arousals were noted as: 30 were spontaneous, 0 were associated with PLMs, 26 were associated with respiratory events.  RESPIRATORY ANALYSIS:  There were a total of 60 respiratory events: 2 obstructive apneas, 1 central apneas and 0 mixed apneas with a total of 3 apneas and an apnea index (AI) of 1.. There were 57 hypopneas with a hypopnea index of 18.2 /hour. The patient also had 0 respiratory event related arousals (RERAs).      The total APNEA/HYPOPNEA INDEX  (AHI) was 19.1 /hour and the total RESPIRATORY DISTURBANCE INDEX was 19.1 /hour.  5 events occurred in REM sleep and 55 events in NREM. The REM AHI was 8.7 /hour versus a non-REM AHI of 21.5 /hour. REM sleep was achieved on a pressure of  cm/h2o (AHI was  .) The patient spent 63% of total sleep time in the supine position. The supine AHI was 15.8 /hour, versus a non-supine AHI of 24.7/hour.  OXYGEN SATURATION & C02:  The wake baseline 02 saturation was 92%, with the lowest being 87%. Time spent below 89% saturation equaled 2 minutes.  PERIODIC LIMB MOVEMENTS: The patient had a total of 0 Periodic Limb Movements. The Periodic Limb Movement (PLM) index was 0 /hour and the PLM Arousal index was 0 /hour.  POLYSOMNOGRAPHY IMPRESSION :   1. Obstructive Sleep Apnea (OSA)  2. Dysfunctions associated with sleep stages or arousals from sleep 3. Abnormal EKG  RECOMMENDATIONS:  1. This study confirms severe obstructive sleep apnea and patient did respond reasonably well on CPAP therapy. I will, therefore, start the patient again on home  CPAP treatment at a pressure of 12 cm via large nasal pillows with heated humidity. The patient should be again reminded to be fully compliant with PAP therapy to improve sleep related symptoms and decrease long term cardiovascular risks. Please note that untreated obstructive sleep apnea carries additional perioperative morbidity. Patients with significant obstructive sleep apnea should receive perioperative PAP therapy and the surgeons and particularly the anesthesiologist should be informed of the diagnosis and the severity of the sleep disordered breathing. 2. The study showed an irregular EKG, in keeping with atrial fibrillation and occasional PVCs; clinical correlation is recommended.  3. This study shows sleep fragmentation and abnormal sleep stage percentages; these are nonspecific findings and per se do not signify an intrinsic sleep disorder or a cause for the patient's sleep-related symptoms. Causes include (but are not limited to) the first night effect of the sleep study, circadian rhythm disturbances, medication effect or an underlying mood disorder or medical problem.  4. The patient should be cautioned  not to drive, work at heights, or operate dangerous or heavy equipment when tired or sleepy. Review and reiteration of good sleep hygiene measures should be pursued with any patient. 5. The patient will be seen in follow-up by Dr. Frances Furbish at [facility] for discussion of the test results and further management strategies. The referring provider will be notified of the test results.  I certify that I have reviewed the entire raw data recording prior to the issuance of this report in accordance with the Standards of Accreditation of the American Academy of Sleep Medicine (AASM)   Huston Foley, MD, PhD Diplomat, American Board of Psychiatry and Neurology (Neurology and Sleep Medicine)

## 2017-01-27 NOTE — Progress Notes (Signed)
Maurice Nguyen: Patient referred by Dr. Everlene Other, seen by CM in April and needed re-eval d/t lack of compliance. Split study on 01/17/17. Please call and notify patient that the recent sleep study confirmed the diagnosis of severe OSA. He did adequately with CPAP during the study with significant improvement of the respiratory events. Therefore, I would like to re-start the patient on CPAP therapy at home by prescribing a machine for home use. I placed the order in the chart. The patient will need a follow up appointment with me or CM in about 10 weeks post set up that has to be scheduled; please go ahead and schedule while you have the patient on the phone and make sure patient understands the importance of keeping this window for the FU appointment, as it is often an insurance requirement and failing to adhere to this may result in losing coverage for sleep apnea treatment yet again.  Please re-enforce once again the importance of compliance with treatment and the need for [place] to monitor compliance data - again an insurance requirement and good feedback for the patient as far as how they are doing.  Also remind patient, that any upcoming CPAP machine or mask issues, should be first addressed with the DME company. Please ask if patient has a preference regarding DME company.  Please arrange for CPAP set up at home through a DME company of patient's choice - once you have spoken to the patient - and faxed/routed report to PCP and referring MD (if other than PCP), you can close this encounter, thanks,   Huston Foley, MD, PhD [facility] ([facility]

## 2017-01-27 NOTE — Addendum Note (Signed)
Addended by: Huston Foley on: 01/27/2017 09:09 AM   Modules accepted: Orders

## 2017-01-27 NOTE — Telephone Encounter (Signed)
I called pt. I advised pt that Dr. Frances Furbish reviewed their sleep study results and found that pt has severe osa and that he did adequately well with the cpap. Dr. Frances Furbish recommends that pt re-start his cpap at home. I reviewed PAP compliance expectations with the pt. Pt is agreeable to starting a CPAP. I advised pt that an order will be sent to a DME, [facility], and [facility] will call the pt within about one week after they file with the pt's insurance. [facility] will show the pt how to use the machine, fit for masks, and troubleshoot the CPAP if needed. A follow up appt was made for insurance purposes with Dr. Frances Furbish on September 17th, 2018 at 3:00pm. Pt verbalized understanding to arrive 15 minutes early and bring their CPAP. A letter with all of this information in it will be mailed to the pt as a reminder. I verified with the pt that the address we have on file is correct. Pt verbalized understanding of results. Pt had no questions at this time but was encouraged to call back if questions arise.

## 2017-01-27 NOTE — Telephone Encounter (Signed)
-----   Message from Huston Foley, MD sent at 01/27/2017  9:09 AM EDT ----- Baxter Hire: Patient referred by Dr. Everlene Other, seen by CM in April and needed re-eval d/t lack of compliance. Split study on 01/17/17. Please call and notify patient that the recent sleep study confirmed the diagnosis of severe OSA. He did adequately with CPAP during the study with significant improvement of the respiratory events. Therefore, I would like to re-start the patient on CPAP therapy at home by prescribing a machine for home use. I placed the order in the chart. The patient will need a follow up appointment with me or CM in about 10 weeks post set up that has to be scheduled; please go ahead and schedule while you have the patient on the phone and make sure patient understands the importance of keeping this window for the FU appointment, as it is often an insurance requirement and failing to adhere to this may result in losing coverage for sleep apnea treatment yet again.  Please re-enforce once again the importance of compliance with treatment and the need for [place] to monitor compliance data - again an insurance requirement and good feedback for the patient as far as how they are doing.  Also remind patient, that any upcoming CPAP machine or mask issues, should be first addressed with the DME company. Please ask if patient has a preference regarding DME company.  Please arrange for CPAP set up at home through a DME company of patient's choice - once you have spoken to the patient - and faxed/routed report to PCP and referring MD (if other than PCP), you can close this encounter, thanks,   Huston Foley, MD, PhD [facility] ([facility]

## 2017-04-11 ENCOUNTER — Ambulatory Visit: Payer: Self-pay | Admitting: Neurology

## 2017-04-25 ENCOUNTER — Other Ambulatory Visit: Payer: Self-pay

## 2017-04-25 DIAGNOSIS — L97519 Non-pressure chronic ulcer of other part of right foot with unspecified severity: Secondary | ICD-10-CM

## 2017-04-27 ENCOUNTER — Encounter: Payer: Self-pay | Admitting: Vascular Surgery

## 2017-04-29 ENCOUNTER — Ambulatory Visit (INDEPENDENT_AMBULATORY_CARE_PROVIDER_SITE_OTHER): Payer: Medicare Other | Admitting: Vascular Surgery

## 2017-04-29 ENCOUNTER — Encounter: Payer: Self-pay | Admitting: Vascular Surgery

## 2017-04-29 ENCOUNTER — Ambulatory Visit (HOSPITAL_COMMUNITY)
Admission: RE | Admit: 2017-04-29 | Discharge: 2017-04-29 | Disposition: A | Discharge status: Home / Self Care | Payer: Medicare Other | Source: Ambulatory Visit | Attending: Vascular Surgery | Admitting: Vascular Surgery

## 2017-04-29 VITALS — BP 128/80 | HR 90 | Temp 98.0°F | Resp 18 | Ht 74.0 in | Wt >= 6400 oz

## 2017-04-29 DIAGNOSIS — L97519 Non-pressure chronic ulcer of other part of right foot with unspecified severity: Secondary | ICD-10-CM | POA: Insufficient documentation

## 2017-04-29 DIAGNOSIS — R600 Localized edema: Secondary | ICD-10-CM

## 2017-04-29 NOTE — Progress Notes (Signed)
Patient ID: Maurice Nguyen, male   DOB: March 20, 1966, 51 y.o.   MRN: [id number]  Reason for Consult: New Patient (Initial Visit) (ulcer to right great toe)   Referred by Coralie Carpen, MD  Subjective:     HPI:  Maurice Nguyen is a 51 y.o. male with history of morbid obesity as well as bilateral lower extremity swelling chronic kidney disease congestive heart failure and on Xarelto for atrial fibrillation. He has a history of weeping from his bilateral lower extremities with the swelling he continues to walk. He does not have frank claudication or rest pain in his legs. He denies any history of DVT. He had ABIs performed prior to today's visit. He does not actively have wounds of his legs.  He has been wearing knee-high compression stockings but not since been told he might have arterial disease.  Past Medical History:  Diagnosis Date  . A-fib (HCC)   . Anticoagulated, on Xarelto, Chads2Vas score 2 10/02/2013  . Back pain, chronic 07/13/2012  . CHF (congestive heart failure) (HCC)   . 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 CKD (chronic kidney disease), stage II   . Complication of anesthesia    " THEY LOST ME AND BROUGHT ME BACK "  . Hypertension 07/13/2012  . Irregular heart beat   . Lumbar herniated disc   . Morbid obesity (HCC)   . Obesity, morbid, BMI 50 or higher (HCC) 07/13/2012  . 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 Shortness of breath   . Type II diabetes mellitus (HCC) 07/13/2012   No family history on file. Past Surgical History:  Procedure Laterality Date  . ABSCESS DRAINAGE    . COLONOSCOPY  08/22/2012   Procedure: COLONOSCOPY;  Surgeon: Shirley Friar, MD;  Location: [facility] ENDOSCOPY;  Service: Endoscopy;  Laterality: N/A;  . DENTAL SURGERY      Short Social History:  Social History  Substance Use Topics  . Smoking status: Never Smoker  . Smokeless tobacco: Never Used  . Alcohol use No     Comment: rarely    No Known  Allergies  Current Outpatient Prescriptions  Medication Sig Dispense Refill  . carvedilol (COREG) 6.25 MG tablet Take 1 tablet (6.25 mg total) by mouth 2 (two) times daily. 60 tablet 11  . glipiZIDE (GLUCOTROL) 10 MG tablet Take 1 tablet (10 mg total) by mouth 2 (two) times daily before a meal. 60 tablet 1  . Insulin Glargine (BASAGLAR KWIKPEN [place]) Start at 20 units every morning.  Increase by 2 units every other day to get your fasting morning blood glucose down to 120.    . metFORMIN (GLUCOPHAGE) 1000 MG tablet take 1 tablet by mouth twice a day with meals    . nitroGLYCERIN (NITROSTAT) 0.4 MG SL tablet Place 1 tablet (0.4 mg total) under the tongue every 5 (five) minutes x 3 doses as needed for chest pain. 30 tablet 12  . omeprazole (PRILOSEC) 20 MG capsule Take 1 capsule (20 mg total) by mouth daily. For 1 month 30 capsule 0  . oxyCODONE-acetaminophen (PERCOCET/ROXICET) 5-325 MG tablet Take 1 tablet by mouth every 6 (six) hours as needed for moderate pain. 15 tablet 0  . potassium chloride SA (K-DUR,KLOR-CON) 20 MEQ tablet TAKE 2 TABLETS BY MOUTH EVERY MORNING 60 tablet 3  . rivaroxaban (XARELTO) 20 MG TABS tablet Take 20 mg by mouth.    . torsemide (DEMADEX) 20 MG tablet TAKE 2 TABLETS BY MOUTH  ONCE DAILY 60 tablet 3  . valsartan (DIOVAN) 80 MG tablet Take 1 tablet (80 mg total) by mouth daily. 31 tablet 11  . sitaGLIPtin (JANUVIA) 100 MG tablet Take 100 mg by mouth.     No current facility-administered medications for this visit.     Review of Systems  Constitutional:  Constitutional negative. HENT: HENT negative.  Eyes: Eyes negative.  Respiratory: Positive for shortness of breath.  Cardiovascular: Positive for dyspnea with exertion, irregular heartbeat and leg swelling.  GI: Gastrointestinal negative.  Musculoskeletal: Musculoskeletal negative.  Skin: Skin negative.  Neurological: Neurological negative. Hematologic: Hematologic/lymphatic negative.  Psychiatric: Psychiatric  negative.        Objective:  Objective   Vitals:   04/29/17 1358  BP: 128/80  Pulse: 90  Resp: 18  Temp: 98 F (36.7 C)  SpO2: 98%  Weight: (!) 425 lb (192.8 kg)  Height: 6\' 2"  (1.88 m)   Body mass index is 54.57 kg/m.  Physical Exam  Constitutional: He is oriented to person, place, and time. He appears well-developed.  HENT:  Head: Normocephalic.  Eyes: Pupils are equal, round, and reactive to light.  Neck: Normal range of motion.  Cardiovascular: Normal rate.   Strong signals bilateral dp  Pulmonary/Chest: Effort normal.  Abdominal: Soft. He exhibits no mass.  Musculoskeletal: Normal range of motion. He exhibits edema.  Neurological: He is alert and oriented to person, place, and time.  Skin: Skin is warm and dry.  Psychiatric: He has a normal mood and affect. His behavior is normal. Thought content normal.    Data: ABIs are noncompressible bilaterally with digital pressures greater than 160 bilaterally and all signals are biphasic.      Assessment/Plan:    51yo Male with history of bilateral lower extremity swelling previously having some wounds but not currently. He does have C3 venous disease with out significant arterial disease. We will get him fitted for thigh-high stockings today have him follow up in 3 months with lower extremity venous reflux study. I discussed the expected possible  comes which would be no further intervention with only stockings as his primary therapy versus the possible need for office procedure for venous ablation. I've also counseled him on weight loss. He demonstrated good understanding we'll get him scheduled in fitted today.      Maeola Harman MD [facility]

## 2017-05-03 DIAGNOSIS — G63 Polyneuropathy in diseases classified elsewhere: Secondary | ICD-10-CM

## 2017-05-03 DIAGNOSIS — R5383 Other fatigue: Secondary | ICD-10-CM | POA: Insufficient documentation

## 2017-05-03 DIAGNOSIS — E349 Endocrine disorder, unspecified: Secondary | ICD-10-CM | POA: Insufficient documentation

## 2017-05-06 NOTE — Addendum Note (Signed)
Addended by: Burton Apley A on: 05/06/2017 09:52 AM   Modules accepted: Orders

## 2017-05-09 ENCOUNTER — Telehealth: Payer: Self-pay | Admitting: Neurology

## 2017-05-09 ENCOUNTER — Encounter: Payer: Self-pay | Admitting: Neurology

## 2017-05-09 NOTE — Telephone Encounter (Signed)
Patient is calling to discuss changing settings on his CPAP.

## 2017-05-10 NOTE — Telephone Encounter (Signed)
I reviewed patient's CPAP compliance data from 04/10/2017 through 05/09/2017. He has only used his CPAP 11 days out of 30 days which indicates poor compliance. Average usage for all days was only 1 hour and 53 minutes, average usage for days on treatment is 5 hours and 7 minutes. Sleep apnea is under control with the current setting of 12 cm with EPR of 3. Patient is advised that there is no fix for his sleep apnea unless he uses CPAP with full compliance. He has severe sleep apnea for which CPAP is the best choice, alternative treatments can be explored including significant weight loss for which he is advised to talk to his primary care physician about weight loss options and weight loss surgery potentially. In addition, sleep apnea surgical treatment options may be explored through ENT. If patient wishes to see an ENT surgeon I would be happy to make a referral but even sleep apnea surgery is not a guarantee for a fix for sleep apnea. Please call patient to advise him of this.

## 2017-05-10 NOTE — Telephone Encounter (Signed)
Pt returned my call. He reports that he is sleeping the same about of time with or without his cpap. I reiterated that he should be using his cpap every night for at least 4 hours, and pt says, "you guys keep telling me that, but have not told me why." I explained that his cpap is used to treat his apnea, and it is an insurance requirement that he uses it every night for at least four hours. Pt says that he wakes up every night after about 1-1.5 hours, with or without cpap, and wants this "fixed." I encouraged pt to speak with [facility] regarding his high leak, and encouraged a new mask. Pt reports that he keeps getting sick and he believes it is related to his cpap. I again encouraged pt to speak with [facility] to make sure that he is cleaning his cpap machine correctly. Pt is asking for a call back by tomorrow morning to discuss what Dr. Frances Furbish recommends to "fix" his sleep. Pt does not think that his pressure needs to be adjusted.

## 2017-05-10 NOTE — Telephone Encounter (Signed)
I called pt to discuss. No answer, left a message asking him to call me back. 

## 2017-05-11 NOTE — Telephone Encounter (Signed)
I called pt. I advised him that his sleep apnea is under control with the current settings. I advised him that there is no fix for his sleep apnea unless he uses his cpap with full compliance. He has severe sleep apnea for which the cpap is the best choice, however, I advised him of the alternative options. Pt says that he would rather stick with using cpap. I reminded pt to use his cpap every night for at least four hours. Pt verbalized understanding.

## 2017-06-09 DIAGNOSIS — E538 Deficiency of other specified B group vitamins: Secondary | ICD-10-CM | POA: Insufficient documentation

## 2017-06-14 ENCOUNTER — Telehealth: Payer: Self-pay

## 2017-06-14 ENCOUNTER — Ambulatory Visit: Payer: Medicare Other | Admitting: Neurology

## 2017-06-14 NOTE — Telephone Encounter (Signed)
Pt did not show for their appt with Dr. Frances Furbish today. This is pt's second no show.

## 2017-06-15 ENCOUNTER — Encounter: Payer: Self-pay | Admitting: Neurology

## 2017-07-03 ENCOUNTER — Telehealth: Payer: Self-pay

## 2017-07-03 NOTE — Telephone Encounter (Signed)
I called pt to let him that know our office is closed tomorrow. No answer, left a message on voicemail that our office is closed tomorrow and to call us back during regular business hours.

## 2017-07-03 NOTE — Telephone Encounter (Signed)
I called pt, no answer, left a message asking him to call us back during business hours to discuss an appt. Will try him again later.

## 2017-07-04 ENCOUNTER — Ambulatory Visit: Payer: Medicare Other | Admitting: Neurology

## 2017-07-08 NOTE — Telephone Encounter (Signed)
I called pt again to offer him an appt on 07/11/17 at 9:00am with Dr. Frances Furbish. No answer, left a message asking him to call me back.

## 2017-07-11 NOTE — Telephone Encounter (Signed)
I called pt again to reschedule his appt. No answer, left a messae asking him to call me back. Pt has not returned any of my 4 phone calls. I will send him a letter in the mail asking him to call me back.

## 2017-08-02 ENCOUNTER — Ambulatory Visit (HOSPITAL_COMMUNITY)
Admission: RE | Admit: 2017-08-02 | Discharge: 2017-08-02 | Disposition: A | Discharge status: Home / Self Care | Payer: Medicare Other | Source: Ambulatory Visit | Attending: Vascular Surgery | Admitting: Vascular Surgery

## 2017-08-02 ENCOUNTER — Ambulatory Visit (INDEPENDENT_AMBULATORY_CARE_PROVIDER_SITE_OTHER): Payer: Medicare Other | Admitting: Vascular Surgery

## 2017-08-02 ENCOUNTER — Encounter: Payer: Self-pay | Admitting: Vascular Surgery

## 2017-08-02 VITALS — BP 140/82 | HR 86 | Temp 97.6°F | Resp 18 | Ht 73.0 in | Wt >= 6400 oz

## 2017-08-02 DIAGNOSIS — R600 Localized edema: Secondary | ICD-10-CM | POA: Diagnosis not present

## 2017-08-02 DIAGNOSIS — I872 Venous insufficiency (chronic) (peripheral): Secondary | ICD-10-CM | POA: Diagnosis not present

## 2017-08-02 DIAGNOSIS — I8393 Asymptomatic varicose veins of bilateral lower extremities: Secondary | ICD-10-CM | POA: Diagnosis not present

## 2017-08-02 NOTE — Progress Notes (Signed)
HISTORY AND PHYSICAL     CC:  Follow up vein study Requesting Provider:  Tracey Harries, MD  HPI: This is a 52 y.o. male who was seen by Dr. Randie Nguyen in October 2018 for hx of weeping wounds on his bilateral lower extremities. He did have ABI's that day, which revealed digital pressures greater than 160 bilaterally and signals were biphasic.  He was scheduled to come back in 3 months with a venous duplex study.    He is on oral agent & insulin for diabetes.  He is on a beta blocker and ARB for blood pressure control.  He takes a diuretic.  He is on Xarelto due to afib.  He has chronic BLE swelling.    Past Medical History:  Diagnosis Date  . A-fib (HCC)   . Anticoagulated, on Xarelto, Chads2Vas score 2 10/02/2013  . Back pain, chronic 07/13/2012  . CHF (congestive heart failure) (HCC)   . 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 CKD (chronic kidney disease), stage II   . Complication of anesthesia    " THEY LOST ME AND BROUGHT ME BACK "  . Hypertension 07/13/2012  . Irregular heart beat   . Lumbar herniated disc   . Morbid obesity (HCC)   . Obesity, morbid, BMI 50 or higher (HCC) 07/13/2012  . 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 Shortness of breath   . Type II diabetes mellitus (HCC) 07/13/2012    Past Surgical History:  Procedure Laterality Date  . ABSCESS DRAINAGE    . COLONOSCOPY  08/22/2012   Procedure: COLONOSCOPY;  Surgeon: Maurice Friar, MD;  Location: [facility];  Service: Endoscopy;  Laterality: N/A;  . DENTAL SURGERY      No Known Allergies  Current Outpatient Medications  Medication Sig Dispense Refill  . carvedilol (COREG) 6.25 MG tablet Take 1 tablet (6.25 mg total) by mouth 2 (two) times daily. 60 tablet 11  . glipiZIDE (GLUCOTROL) 10 MG tablet Take 1 tablet (10 mg total) by mouth 2 (two) times daily before a meal. 60 tablet 1  . Insulin Glargine (BASAGLAR KWIKPEN Baker) Start at 20 units every morning.  Increase by 2 units every other  day to get your fasting morning blood glucose down to 120.    . metFORMIN (GLUCOPHAGE) 1000 MG tablet take 1 tablet by mouth twice a day with meals    . nitroGLYCERIN (NITROSTAT) 0.4 MG SL tablet Place 1 tablet (0.4 mg total) under the tongue every 5 (five) minutes x 3 doses as needed for chest pain. 30 tablet 12  . omeprazole (PRILOSEC) 20 MG capsule Take 1 capsule (20 mg total) by mouth daily. For 1 month 30 capsule 0  . oxyCODONE-acetaminophen (PERCOCET/ROXICET) 5-325 MG tablet Take 1 tablet by mouth every 6 (six) hours as needed for moderate pain. 15 tablet 0  . potassium chloride SA (K-DUR,KLOR-CON) 20 MEQ tablet TAKE 2 TABLETS BY MOUTH EVERY MORNING 60 tablet 3  . rivaroxaban (XARELTO) 20 MG TABS tablet Take 20 mg by mouth.    . sitaGLIPtin (JANUVIA) 100 MG tablet Take 100 mg by mouth.    . torsemide (DEMADEX) 20 MG tablet TAKE 2 TABLETS BY MOUTH ONCE DAILY 60 tablet 3  . valsartan (DIOVAN) 80 MG tablet Take 1 tablet (80 mg total) by mouth daily. 31 tablet 11   No current facility-administered medications for this visit.     History reviewed. No pertinent family history.  Social History   Socioeconomic  History  . Marital status: Single    Spouse name: Not on file  . Number of children: Not on file  . Years of education: Not on file  . Highest education level: Not on file  Social Needs  . Financial resource strain: Not on file  . Food insecurity - worry: Not on file  . Food insecurity - inability: Not on file  . Transportation needs - medical: Not on file  . Transportation needs - non-medical: Not on file  Occupational History  . Occupation: N/A  Tobacco Use  . Smoking status: Never Smoker  . Smokeless tobacco: Never Used  Substance and Sexual Activity  . Alcohol use: No    Comment: rarely  . Drug use: No  . Sexual activity: Yes  Other Topics Concern  . Not on file  Social History Narrative   Denies caffeine use      REVIEW OF SYSTEMS:   [X]  denotes positive  finding, [ ]  denotes negative finding Cardiac  Comments:  Chest pain or chest pressure:    Shortness of breath upon exertion: x   Short of breath when lying flat: x   Irregular heart rhythm: x       Vascular    Pain in calf, thigh, or hip brought on by ambulation:    Pain in feet at night that wakes you up from your sleep:     Blood clot in your veins:    Leg swelling:  x       Pulmonary    Oxygen at home:    Productive cough:     Wheezing:         Neurologic    Sudden weakness in arms or legs:     Sudden numbness in arms or legs:     Sudden onset of difficulty speaking or slurred speech:    Temporary loss of vision in one eye:     Problems with dizziness:         Gastrointestinal    Blood in stool:     Vomited blood:         Genitourinary    Burning when urinating:     Blood in urine:        Psychiatric    Major depression:         Hematologic    Bleeding problems:    Problems with blood clotting too easily:        Skin    Rashes or ulcers:        Constitutional    Fever or chills:      PHYSICAL EXAMINATION:  Vitals:   08/02/17 1531  BP: 140/82  Pulse: 86  Resp: 18  Temp: 97.6 F (36.4 C)  SpO2: 99%   Vitals:   08/02/17 1531  Weight: (!) 429 lb (194.6 kg)  Height: 6\' 1"  (1.854 m)   Body mass index is 56.6 kg/m.  General:  WDWN in NAD; vital signs documented above Gait: Not observed HENT: WNL, normocephalic Pulmonary: normal non-labored breathing Abdomen: obese Skin: without rashes Extremities: BLE swelling; no ulcers present Musculoskeletal: no muscle wasting or atrophy  Neurologic: A&O X 3;  No focal weakness or paresthesias are detected Psychiatric:  The pt has Normal affect.   Non-Invasive Vascular Imaging:   Lower extremity venous duplex study 08/02/17: 1.  No evidence of DVT or superficial thrombosis 2.  Venous incompetence of >574ms in the right GSV in the proximal thigh and the idstal thigh and femoral vein  3.  Venous  incompetence of >575ms in the left CFV  Right GSV measures:  0.32cm-0.63cm Left GSV measures:  0.47cm-0.71cm  Right SSV measures:  0.33cm-0.84cm Left SSV measures:  0.23cm-0.44cm  Pt meds includes: Statin:  No. Beta Blocker:  Yes. Aspirin:  No. ACEI:  No. ARB:  Yes.   CCB use:  No Other Antiplatelet/Anticoagulant:  Yes Xarelto   ASSESSMENT/PLAN:: 52 y.o. male with chronic venous insufficiency.   -his venous duplex did show some venous incompetence in the right proximal and distal portion of the GSV in the thigh, however, the saphenous vein is small and he would not benefit from ablation.   He will benefit from graduated compression stockings and Dr. Arbie Cookey discussed this with the pt.  Dr. Arbie Cookey also discussed with him if he is unable to use the stockings, he can try ace wraps starting at the toes and wrap upward.   -he was given a prescription for 20-24mmHg graduated thigh high compression stockings today. -he was also instructed to elevate his legs when possible, but this may be difficult as pt gets short of breath with laying flat.    Doreatha Massed, PA-C [facility] [phone]  Clinic MD:  Pt seen and examined with Dr. Arbie Cookey  I have examined the patient, reviewed and agree with above.  Gretta Began, MD 08/02/2017 4:42 PM

## 2017-08-30 ENCOUNTER — Encounter: Payer: Self-pay | Admitting: Neurology

## 2017-09-01 ENCOUNTER — Encounter: Payer: Self-pay | Admitting: Neurology

## 2017-09-01 ENCOUNTER — Ambulatory Visit (INDEPENDENT_AMBULATORY_CARE_PROVIDER_SITE_OTHER): Payer: Medicare Other | Admitting: Neurology

## 2017-09-01 VITALS — BP 173/97 | HR 56 | Ht 74.0 in | Wt >= 6400 oz

## 2017-09-01 DIAGNOSIS — G4733 Obstructive sleep apnea (adult) (pediatric): Secondary | ICD-10-CM | POA: Diagnosis not present

## 2017-09-01 DIAGNOSIS — Z789 Other specified health status: Secondary | ICD-10-CM | POA: Diagnosis not present

## 2017-09-01 NOTE — Patient Instructions (Addendum)
Unfortunately, you have not been able to use the CPAP. We did 2 sleep studies with you to help you get acclimated with CPAP.  I will refer you to an ENT specialist for surgical eval for OSA.  Also, you can consider seeing dentistry for consideration for an oral appliance. You have no dentist currently, therefore, I will make these 2 referrals on your behalf today.  You have moderate sleep apnea and should not remain untreated.  Please remember, the risks and ramifications of moderate to severe obstructive sleep apnea or OSA are: Cardiovascular disease, including congestive heart failure, stroke, difficult to control hypertension, arrhythmias, and even type 2 diabetes has been linked to untreated OSA. Sleep apnea causes disruption of sleep and sleep deprivation in most cases, which, in turn, can cause recurrent headaches, problems with memory, mood, concentration, focus, and vigilance. Most people with untreated sleep apnea report excessive daytime sleepiness, which can affect their ability to drive. Please do not drive if you feel sleepy.  At this point, I will initiate a referral to ENT. We will send all our records and sleep study reports.  Please contact us if you have not heard about your ENT consultation or dentist appointment latest by 2 weeks from now, we will look into where the hold up is.  Please work hard on weight loss, talk to your primary care physician about seeing a medical weight management doctor vs. bariatric program.

## 2017-09-01 NOTE — Progress Notes (Signed)
Subjective:    Nguyen ID: Maurice Nguyen is a 52 y.o. male.  HPI     Interim history:   Maurice Nguyen is a 52 year old right-handed gentleman with an underlying complex medical history of A. fib, on Xarelto, type 2 diabetes, combined systolic and diastolic heart failure, chronic kidney failure, hypertension, morbid obesity, chronic back pain, Hx of CAP, who presents for follow-up consultation of Maurice obstructive sleep apnea, after repeat sleep study testing for requalification of treatment with CPAP. Maurice Nguyen is accompanied by Maurice GF today. Of note, Maurice Nguyen no showed for an appointment on 06/14/2017. I first met him on 03/22/2016 at Maurice request of Maurice primary care physician, at which time Maurice Nguyen reported snoring and daytime somnolence and recent admission to [facility] hospital for pneumonia. I suggested we proceed with sleep study testing. Maurice Nguyen had a split-night sleep study on 04/25/2016 which showed severe sleep apnea and reasonably good results with CPAP of 10 cm. Unfortunately, Maurice compliance data suggested noncompliance.   Maurice Nguyen had a follow-up appointment with Cecille Rubin on 11/11/2016 for compliance check. Unfortunately, Maurice insurance did not cover him for Maurice CPAP machine due to noncompliance and Maurice Nguyen was advised to consider requalification through Maurice sleep study.   Maurice Nguyen had another split-night sleep study on 01/17/2017 which showed severe sleep apnea with an AHI of 47.9 at baseline, O2 nadir of 84%, near absence of REM sleep and slow-wave sleep was absent at baseline. Maurice Nguyen was titrated on CPAP up to 12 cm. On Maurice final pressure Maurice AHI was 0 per hour, O2 nadir of 94% with supine non-REM sleep achieved. Based on Maurice test results I prescribed CPAP therapy for home use a pressure of 12 cm. Maurice Nguyen called in Maurice interim asking for alternative treatment options and was advised for this degree of severe sleep apnea CPAP was Maurice best treatment option.  Today, 09/01/2017: I reviewed Maurice CPAP compliance  data from Maurice past 180 days from 03/04/2017 through 08/30/2017 which is Maurice last 6 months, during which time Maurice Nguyen used Maurice machine only 14 days, indicating noncompliance, no usage since October.  I reviewed Maurice CPAP compliance data for Maurice past 60 and 90 days. Maurice percent used days greater than 4 hours was 15 and 16% respectively, indicating poor compliance. AHI at goal, between 2.3 per hour at 3.6 per hour. Leak on Maurice high side with Maurice 95th percentile around 35 L/m on a pressure of 12 cm with EPR. Maurice Nguyen reports not being able to sleep with Maurice CPAP. Maurice Nguyen does not sleep very much in one stretch. This has not changed before or after CPAP. Maurice Nguyen is not able to elaborate very much while Maurice Nguyen has not been able to use Maurice CPAP. Maurice Nguyen would be amenable to looking into different treatment options.  Maurice Nguyen's allergies, current medications, family history, past medical history, past social history, past surgical history and problem list were reviewed and updated as appropriate.   Previously:  03/22/2016: (Maurice Nguyen) reports snoring and excessive daytime somnolence. I reviewed your office note from 03/10/2016, which you kindly included. I also reviewed Maurice hospital records from March. Maurice Nguyen was admitted on 10/01/2015 and discharged on 10/04/2015. Maurice Nguyen was admitted with cough, hematemesis, and shortness of breath. Maurice Nguyen was diagnosed with community-acquired pneumonia and sepsis and was treated with IV antibiotics. Maurice Nguyen was seen by gastroenterology for hematemesis and was advised to take Maurice PPI. Maurice Nguyen was also advised to follow-up as an outpatient with GI. Maurice Epworth sleepiness score is 6 out of  24 today, Maurice fatigue score is 38/63. Maurice Nguyen denies morning headaches or nocturia. Maurice Nguyen has RLS symptoms and Maurice Nguyen reports, Maurice Nguyen kicks and moves Maurice legs in Maurice sleep. Maurice Nguyen has a cousin with OSA, on CPAP. Maurice Nguyen takes oxycodone once or 2 times a day.  Maurice Nguyen has been sleeping upright, in Maurice chair or recliner, and has been doing this for well over a year, Maurice Nguyen  estimates. Sleeps in Maurice den with Maurice TV on all night.  Maurice Nguyen does not keep a sleep schedule, generally goes to bed after midnight, wakeup time varies, depending on how much sleep Maurice Nguyen got and when Maurice Nguyen went to bed. Maurice Nguyen is a nonsmoker, does not drink alcohol, denies illicit drug use and does not drink caffeine on a daily basis. Maurice Nguyen does not take Maurice diuretic daily, generally 3-4 times a week.  Maurice Nguyen snores, this can be loud, depending on Maurice sleep position, and Maurice Nguyen has woken up with a sense of gasping for air. Maurice sleep is very interrupted, Maurice Nguyen has multiple nighttime awakenings.  Maurice Past Medical History Is Significant For: Past Medical History:  Diagnosis Date  . A-fib ([place])   . Anticoagulated, on Xarelto, Chads2Vas score 2 10/02/2013  . Back pain, chronic 07/13/2012  . CHF (congestive heart failure) ([place])   . 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 CKD (chronic kidney disease), stage II   . Complication of anesthesia    " THEY LOST ME AND BROUGHT ME BACK "  . Hypertension 07/13/2012  . Irregular heart beat   . Lumbar herniated disc   . Morbid obesity ([place])   . Obesity, morbid, BMI 50 or higher ([place]) 07/13/2012  . 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 Shortness of breath   . Type II diabetes mellitus ([place]) 07/13/2012    Maurice Past Surgical History Is Significant For: Past Surgical History:  Procedure Laterality Date  . ABSCESS DRAINAGE    . COLONOSCOPY  08/22/2012   Procedure: COLONOSCOPY;  Surgeon: Lear Ng, MD;  Location: [facility];  Service: Endoscopy;  Laterality: N/A;  . DENTAL SURGERY      Maurice Family History Is Significant For: No family history on file.  Maurice Social History Is Significant For: Social History   Socioeconomic History  . Marital status: Single    Spouse name: None  . Number of children: None  . Years of education: None  . Highest education level: None  Social Needs  . Financial resource strain: None  . Food insecurity - worry: None   . Food insecurity - inability: None  . Transportation needs - medical: None  . Transportation needs - non-medical: None  Occupational History  . Occupation: N/A  Tobacco Use  . Smoking status: Never Smoker  . Smokeless tobacco: Never Used  Substance and Sexual Activity  . Alcohol use: No    Comment: rarely  . Drug use: No  . Sexual activity: Yes  Other Topics Concern  . None  Social History Narrative   Denies caffeine use     Maurice Allergies Are:  No Known Allergies:   Maurice Current Medications Are:  Outpatient Encounter Medications as of 09/01/2017  Medication Sig  . carvedilol (COREG) 6.25 MG tablet Take 1 tablet (6.25 mg total) by mouth 2 (two) times daily.  Marland Kitchen glipiZIDE (GLUCOTROL) 10 MG tablet Take 1 tablet (10 mg total) by mouth 2 (two) times daily before a meal.  . Insulin Glargine (BASAGLAR KWIKPEN [place]) Start at 20 units  every morning.  Increase by 2 units every other day to get your fasting morning blood glucose down to 120.  . metFORMIN (GLUCOPHAGE) 1000 MG tablet take 1 tablet by mouth twice a day with meals  . nitroGLYCERIN (NITROSTAT) 0.4 MG SL tablet Place 1 tablet (0.4 mg total) under Maurice tongue every 5 (five) minutes x 3 doses as needed for chest pain.  Marland Kitchen omeprazole (PRILOSEC) 20 MG capsule Take 1 capsule (20 mg total) by mouth daily. For 1 month  . oxyCODONE-acetaminophen (PERCOCET/ROXICET) 5-325 MG tablet Take 1 tablet by mouth every 6 (six) hours as needed for moderate pain.  . potassium chloride SA (K-DUR,KLOR-CON) 20 MEQ tablet TAKE 2 TABLETS BY MOUTH EVERY MORNING  . rivaroxaban (XARELTO) 20 MG TABS tablet Take 20 mg by mouth.  . torsemide (DEMADEX) 20 MG tablet TAKE 2 TABLETS BY MOUTH ONCE DAILY  . valsartan (DIOVAN) 80 MG tablet Take 1 tablet (80 mg total) by mouth daily.  . sitaGLIPtin (JANUVIA) 100 MG tablet Take 100 mg by mouth.   No facility-administered encounter medications on file as of 09/01/2017.   :  Review of Systems:  Out of a complete 14 point  review of systems, all are reviewed and negative with Maurice exception of these symptoms as listed below: Review of Systems  Neurological:       Pt presents today to follow up on Maurice cpap. Pt reports that Maurice cpap is not going well.    Objective:  Neurological Exam  Physical Exam Physical Examination:   Vitals:   09/01/17 0849  BP: (!) 173/97  Pulse: (!) 56   General Examination: Maurice Nguyen is a 52 y.o. male in no acute distress. Maurice Nguyen appears well-developed and well-nourished and well groomed.   HEENT: Normocephalic, atraumatic, pupils are equal, round and reactive to light and accommodation. Extraocular tracking is good without limitation to gaze excursion or nystagmus noted. Normal smooth pursuit is noted. Hearing is grossly intact. Face is symmetric with normal facial animation and normal facial sensation. Speech is clear with no dysarthria noted. There is no hypophonia. There is no lip, neck/head, jaw or voice tremor. Neck is supple with active FROM. Oropharynx exam reveals: mild mouth dryness, adequate dental hygiene and marked airway crowding, due to large tongue, large uvula, thicker soft palate, tonsils not visualized. Mallampati is class III. Tongue protrudes centrally and palate elevates symmetrically. Neck circumference is 20-1/2 inches. Maurice Nguyen has a minimal overbite.  Chest: Clear to auscultation without wheezing, rhonchi or crackles noted.  Heart: S1+S2+0, irregularly irregular without murmurs, rubs or gallops noted.   Abdomen: Soft, non-tender and non-distended with normal bowel sounds appreciated on auscultation.  Extremities: There is edema in Maurice distal lower extremities bilaterally.   Skin: Warm and dry without trophic changes noted.  Musculoskeletal: exam reveals no obvious joint deformities, tenderness or joint swelling or erythema, but reports back pain.    Neurologically:  Mental status: Maurice Nguyen is awake, alert and oriented in all 4 spheres. Maurice immediate  and remote memory, attention, language skills and fund of knowledge are appropriate. There is no evidence of aphasia, agnosia, apraxia or anomia. Speech is clear with normal prosody and enunciation. Thought process is linear. Mood is normal and affect is normal.  Cranial nerves II - XII are as described above under HEENT exam. In addition: shoulder shrug is normal with equal shoulder height noted. Motor exam: Normal bulk, strength and tone is noted. There is no tremor.  Cerebellar testing: No dysmetria or intention tremor.  Gait, station and balance: Maurice Nguyen stands with difficulty. No veering to one side is noted. No leaning to one side is noted. Posture is age-appropriate and stance is narrow based. Gait shows slow and cautious gait.   Assessment and Plan:  In summary, ADRICK KESTLER is a 52 year old male with an underlying complex medical history of A. fib, on Xarelto, type 2 diabetes, combined systolic and diastolic heart failure, chronic kidney failure, hypertension, morbid obesity, chronic back pain, Hx of CAP, who presents for FU consultation of Maurice moderate obstructive sleep apnea. We did sleep study testing twice to help him qualify and requalify for CPAP therapy. Unfortunately, Maurice Nguyen has not been able to use CPAP. Maurice Nguyen has not used CPAP since October. Previously, Maurice Nguyen was also noncompliant before Maurice Nguyen had Maurice second sleep study on 01/17/2017. Maurice prior sleep study from 04/25/2016 showed severe obstructive sleep apnea. Given Maurice medical history and morbid obesity, Maurice Nguyen should not remain untreated for sleep apnea. Maurice Nguyen is advised of this again and I had a very long discussion with Maurice Nguyen and Maurice Nguyen. Alternative treatments should be explored. I made a referral to ENT for surgical evaluation for sleep apnea and dentistry for potential candidacy for an oral appliance. I suggested as needed follow-up with me. I did remind Nguyen regarding Maurice cardiovascular potential consequences of untreated moderate to  severe OSA. Maurice Nguyen and Maurice Nguyen demonstrated understanding and agreement. Furthermore, Maurice Nguyen is strongly advised to pursue weight loss aggressively and talk to Maurice primary care physician about medical weight management referral versus surgical weight management referral to bariatric surgery. I answered all their questions today and Maurice Nguyen and Maurice GF were in agreement.  I spent 25 minutes in total face-to-face time with Maurice Nguyen, more than 50% of which was spent in counseling and coordination of care, reviewing test results, reviewing medication and discussing or reviewing Maurice diagnosis of OSA, its prognosis and treatment options. Pertinent laboratory and imaging test results that were available during this visit with Maurice Nguyen were reviewed by me and considered in my medical decision making (see chart for details).

## 2017-09-20 ENCOUNTER — Ambulatory Visit: Payer: Medicare Other | Admitting: Podiatry

## 2017-09-20 ENCOUNTER — Ambulatory Visit (INDEPENDENT_AMBULATORY_CARE_PROVIDER_SITE_OTHER): Payer: Medicare Other | Admitting: Podiatry

## 2017-09-20 ENCOUNTER — Encounter: Payer: Self-pay | Admitting: Podiatry

## 2017-09-20 DIAGNOSIS — M2142 Flat foot [pes planus] (acquired), left foot: Secondary | ICD-10-CM | POA: Diagnosis not present

## 2017-09-20 DIAGNOSIS — L97901 Non-pressure chronic ulcer of unspecified part of unspecified lower leg limited to breakdown of skin: Secondary | ICD-10-CM | POA: Diagnosis not present

## 2017-09-20 DIAGNOSIS — E1149 Type 2 diabetes mellitus with other diabetic neurological complication: Secondary | ICD-10-CM

## 2017-09-20 DIAGNOSIS — M2042 Other hammer toe(s) (acquired), left foot: Secondary | ICD-10-CM | POA: Diagnosis not present

## 2017-09-20 DIAGNOSIS — M2141 Flat foot [pes planus] (acquired), right foot: Secondary | ICD-10-CM | POA: Diagnosis not present

## 2017-09-20 DIAGNOSIS — M2041 Other hammer toe(s) (acquired), right foot: Secondary | ICD-10-CM | POA: Diagnosis not present

## 2017-09-20 DIAGNOSIS — I872 Venous insufficiency (chronic) (peripheral): Secondary | ICD-10-CM

## 2017-09-21 NOTE — Progress Notes (Signed)
Subjective: Maurice Nguyen presents today also requesting diabetic shoes as well as compression stockings.  He states he did see the vascular surgeon and he did recommend compression stockings and he had been previously like a new pair.  He states he has swelling to his legs he will get occasional wounds to his legs and he currently has small wounds to both of his legs.  Denies any drainage or pus but his legs are swollen which today have been for some time.  He states that his symptoms are not new. Denies any systemic complaints such as fevers, chills, nausea, vomiting. No acute changes since last appointment, and no other complaints at this time.   Objective: AAO x3, NAD DP/PT pulses palpable 1/4 bilaterally, CRT less than 3 seconds-chronic bilateral lower extremity edema there is venous insufficiency changes present.  On the anterior aspect of bilateral legs there are small superficial granular wounds due to swelling.  There is no probing, undermining or tunneling in the measure approximately 0.5 x 0.4 cm one on each leg.  There is also small areas of clear drainage but there is no purulence.  There is no areas of fluctuation or crepitation.  There is no malodor. Protective sensation decreased with Dorann Ou monofilament. Hammertoes are present as well as flat feet. No other open lesions or pre-ulcerative lesions.  No pain with calf compression, swelling, warmth, erythema  Assessment: Bilateral leg swelling, venous insufficiency; type II diabetic with neuropathy requesting diabetic shoes  Plan: -All treatment options discussed with the patient including all alternatives, risks, complications.  -Given the venous ulcerations to bilateral legs I discussed an unna boot he states he has had this previously and done well with it.. Bilateral unna boots were applied today.  Precautions were advised on when to remove it in 5 days for next week and I will see him otherwise if it were to swell or cause pain  or have any discoloration of the toes she is to go ahead and take it off.  He verbally understood this. -I did measure his legs today for compression stockings.  I ordered this through [facility] -Paperwork is completed today for precertification of diabetic shoes. -He has no further questions or concerns today and agrees with this plan.RTC 1 week or sooner if needed.  -Patient encouraged to call the office with any questions, concerns, change in symptoms.   [name]

## 2017-09-27 ENCOUNTER — Other Ambulatory Visit: Payer: Medicare Other

## 2017-09-27 ENCOUNTER — Ambulatory Visit (INDEPENDENT_AMBULATORY_CARE_PROVIDER_SITE_OTHER): Payer: Medicare Other | Admitting: Podiatry

## 2017-09-27 ENCOUNTER — Ambulatory Visit: Payer: Medicare Other | Admitting: Podiatry

## 2017-09-27 DIAGNOSIS — L97901 Non-pressure chronic ulcer of unspecified part of unspecified lower leg limited to breakdown of skin: Secondary | ICD-10-CM

## 2017-09-27 DIAGNOSIS — I872 Venous insufficiency (chronic) (peripheral): Secondary | ICD-10-CM | POA: Diagnosis not present

## 2017-09-27 NOTE — Progress Notes (Signed)
Subjective: Maurice Nguyen presents the office today for follow-up evaluation of swelling to bilateral legs and for possible Unna boots bilaterally.  He states the swelling has improved quite a bit.  He denies any drainage coming from his legs.  He tolerated the Unna boot well any complications.  He also presents to get measured for shoes with Betha.  He has not yet heard about the compression stockings that are ordered. Denies any systemic complaints such as fevers, chills, nausea, vomiting. No acute changes since last appointment, and no other complaints at this time.   Objective: AAO x3, NAD DP/PT pulses palpable bilaterally, CRT less than 3 seconds Individually removed today.  There is decreased swelling to the legs and the wound on the right side is healed.  On the anterior aspect the left leg is a very small superficial granular wound present.  There is no drainage coming from the legs today.  Small amount of interdigital maceration present.  No other skin lesions are identified. No open lesions or pre-ulcerative lesions.  No pain with calf compression, swelling, warmth, erythema  Assessment: Chronic venous insufficiency bilaterally with decreased swelling, healing ulceration left side  Plan: -All treatment options discussed with the patient including all alternatives, risks, complications.  -Today in Unna boots were applied bilaterally.  Precautions were advised to remove it should it become painful or increased swelling or discoloration of the toes.  Awaiting compression socks. I ordered these through [facility]. Fenton Foy evaluate him today measurement for diabetic shoes. -Castellani's paint was applied interdigitally -RTC 1 week or sooner if needed.  -Patient encouraged to call the office with any questions, concerns, change in symptoms.   [name]

## 2017-10-04 ENCOUNTER — Telehealth: Payer: Self-pay | Admitting: *Deleted

## 2017-10-04 ENCOUNTER — Ambulatory Visit (INDEPENDENT_AMBULATORY_CARE_PROVIDER_SITE_OTHER): Payer: Medicare Other | Admitting: Podiatry

## 2017-10-04 DIAGNOSIS — I872 Venous insufficiency (chronic) (peripheral): Secondary | ICD-10-CM | POA: Diagnosis not present

## 2017-10-04 DIAGNOSIS — L97901 Non-pressure chronic ulcer of unspecified part of unspecified lower leg limited to breakdown of skin: Secondary | ICD-10-CM | POA: Diagnosis not present

## 2017-10-04 NOTE — Telephone Encounter (Signed)
Dr. Ardelle Anton states pt has not heard anything concerning the compression hose 09/20/2017. I spoke with Joannie Springs - [facility], she states the 20-61mmHg compression hose are not covered by pt's insurance, and they have been unable to contact pt to offer self-pay. I spoke with pt and he states he doesn't have any money, but he use to get them delivered by [employer] and all he had to do was sign for them. I asked him the name of the company shipped from and he states he doesn't know. I told pt I would see if Dr. Ardelle Anton would like to increase the pressure or send pt to medical supply.

## 2017-10-04 NOTE — Telephone Encounter (Signed)
I don't think he would tolerate increasing the pressure. Can we try [facility]?

## 2017-10-05 ENCOUNTER — Telehealth: Payer: Self-pay | Admitting: Podiatry

## 2017-10-05 NOTE — Telephone Encounter (Signed)
Left message informing pt the compression hose he would need are $40.00-$50.00/pair depending on the brand, and to call me if this is something he would like to purchase I would need to fax a order to [facility].

## 2017-10-05 NOTE — Telephone Encounter (Signed)
Message answered on 10/05/2017 Telephone Call.

## 2017-10-05 NOTE — Telephone Encounter (Signed)
I spoke with pt and he states he is on disability and doesn't have any money. I told pt that if he could give me the name of the company that sent the compression hose previously, I would contact to see if they accepted orders and his insurance. Pt states he will see if he has a box around the house and will call again.

## 2017-10-05 NOTE — Telephone Encounter (Signed)
I am not sure where he got them from previously that was covered. If he can let us know where he got them from then I am more than happy to get it for him.

## 2017-10-05 NOTE — Telephone Encounter (Signed)
Pt stated he missed a call from our office and wanted to know who called him and what it was about. I told him Joya San, RN tried calling him in regards to the compression hose. I told him what she stated in the message and pt stated he has never had to pay out of pocket before. I told Mr. Rutenberg that she was at lunch and I could transfer him to her voicemail and she would call him back. Pt asked me to send her a message instead asking her to call him back and that he wanted to speak with her. I told him I would go ahead and send a message to Guttenberg now.

## 2017-10-05 NOTE — Telephone Encounter (Signed)
Salesman states 20-41mmHg Compression hose $40.00 - $50.00/pair depending on pt's choice of brand.

## 2017-10-07 ENCOUNTER — Encounter: Payer: Self-pay | Admitting: Cardiovascular Disease

## 2017-10-07 ENCOUNTER — Ambulatory Visit (INDEPENDENT_AMBULATORY_CARE_PROVIDER_SITE_OTHER): Payer: Medicare Other | Admitting: Cardiovascular Disease

## 2017-10-07 VITALS — BP 130/88 | HR 84 | Ht 73.0 in | Wt >= 6400 oz

## 2017-10-07 DIAGNOSIS — I5042 Chronic combined systolic (congestive) and diastolic (congestive) heart failure: Secondary | ICD-10-CM | POA: Diagnosis not present

## 2017-10-07 DIAGNOSIS — Z6841 Body Mass Index (BMI) 40.0 and over, adult: Secondary | ICD-10-CM | POA: Diagnosis not present

## 2017-10-07 DIAGNOSIS — I482 Chronic atrial fibrillation, unspecified: Secondary | ICD-10-CM

## 2017-10-07 DIAGNOSIS — I4891 Unspecified atrial fibrillation: Secondary | ICD-10-CM | POA: Diagnosis not present

## 2017-10-07 NOTE — Patient Instructions (Signed)

## 2017-10-07 NOTE — Progress Notes (Signed)
Maurice Nguyen Date of Birth: Sep 07, 1965 Medical Record #[id number]   Problem List 1. Combined systolic and diastolic congestive heart failure 2. Paroxysmal atrial fibrillation 3. Essential hypertension 4. Morbid obesity 5. Diabetes mellitus   Maurice Nguyen is seen back today for a post hospital visit.  - he was siiin  in consultation back in 2013. He is a 52 year old male with PAF, mixed CHF with systolic dysfunction, DM, chronic back pain, morbid obesity, HTN and non compliance. CHADSVASC of 3 (DM, HTN, CHF) with 3.2% estimated annual stroke risk.     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 Last seen by Dr. Excell Seltzer in January of 2014.   Most recently presented to [facility] with worsening edema and DOE. Noted to be in AF with RVR. Had ran out of his Lasix. His AF was difficult to control - now on CCB and high dose BB therapy. On xarelto. Diuresed down from 460 to 444.   Comes in today. Here with his girlfriend. Says he is feeling "great". Says his swelling has improved. Says he has scales at home - weight 444 to 447 at home. No chest pain. Tries to watch his salt. Not short of breath. Mostly limited by back pain. Says he can afford his medicine.   Dec. 17, 2015:  Maurice Nguyen is a 52 yo with hx of morbid obesity, Atrial fib, ruptured appendix.  I met him at [facility] in 2013.  This is his first OV with me. He had run out of his meds. Presented to the ER with shortness of breath.  Was restarted on lasix.  Has lost 14 lbs over the past 3 days.  Exercising some.   Has had leg swelling.  Was difficult for him to walk.    Echo in 2013-11-06: TDS: pooracoustic windows, poor sound wave transmission, and body habitus. Intravenous contrast (Definity) was administered. - Left ventricle: The cavity size was normal. There was moderate concentric hypertrophy. Systolic function was mildly reduced. The estimated ejection fraction was in  the range of 45% to 50%. Definity contrast was administered which did not aid in wall motion or EF determination. The study is not technically sufficient to allow evaluation of LV diastolic function. - Mitral valve: Poorly visualized. Mild regurgitation. - Left atrium: The atrium was mildly dilated. - Inferior vena cava: The vessel was dilated; the respirophasic diameter changes were blunted (< 50%); findings are consistent with elevated central venous pressure. - Pericardium, extracardiac: There was no pericardial effusion.    He is doing well.    01/30/2015:  Is not taking his meds.  Has had some rectal bleeding, coughing.  So he stopped taking his heart meds - still took his diabetic meds and lasix .  Has been very short of breath for the past 2 - 3 days   Was in the hospital in April , 2016 for leg edema and ulceration .  O2 sats are 95% today at rest.  Oct. 25, 2016:  Maurice Nguyen has been back in the hospital for an ulcers on both legs. He is now wearing compression Ace wraps.  His breathing seems to be going pretty well. He's taking his cardiac medications.  Feb. 1, 2017:   Maurice Nguyen is doing ok.  Still has severe DOE ,   DOE has been improving .  no CP .  Needs to find another primary medical doctor  - Maurice Nguyen died recently   11-07-22  15, 2019:  Maurice Nguyen is seen after 2  year absence for atrial fib and combined S/D CHF  Continue to have dyspnea .  No real exercise Wearing una boots.  Has lots of leg swelling  Sees Dr. Coletta Nguyen with [facility] now.   Trying to eat better.  [place], beans, rotisseri chicken     Wt Readings from Last 3 Encounters:  10/07/17 (!) 431 lb (195.5 kg)  09/01/17 (!) 421 lb (191 kg)  08/02/17 (!) 429 lb (194.6 kg)   Says hes cutting back on carbs.  Is avoiding salt    Current Outpatient Medications  Medication Sig Dispense Refill  . carvedilol (COREG) 6.25 MG tablet Take 1 tablet (6.25 mg total) by mouth 2 (two) times  daily. 60 tablet 11  . gabapentin (NEURONTIN) 300 MG capsule Take 1 capsule by mouth daily as needed for pain.    Marland Kitchen glipiZIDE (GLUCOTROL) 10 MG tablet Take 1 tablet (10 mg total) by mouth 2 (two) times daily before a meal. 60 tablet 1  . Insulin Glargine (BASAGLAR KWIKPEN [place]) Start at 20 units every morning.  Increase by 2 units every other day to get your fasting morning blood glucose down to 120.    . metFORMIN (GLUCOPHAGE) 1000 MG tablet take 1 tablet by mouth twice a day with meals    . nitroGLYCERIN (NITROSTAT) 0.4 MG SL tablet Place 1 tablet (0.4 mg total) under the tongue every 5 (five) minutes x 3 doses as needed for chest pain. 30 tablet 12  . omeprazole (PRILOSEC) 20 MG capsule Take 1 capsule (20 mg total) by mouth daily. For 1 month 30 capsule 0  . oxyCODONE-acetaminophen (PERCOCET/ROXICET) 5-325 MG tablet Take 1 tablet by mouth every 6 (six) hours as needed for moderate pain. 15 tablet 0  . potassium chloride SA (K-DUR,KLOR-CON) 20 MEQ tablet TAKE 2 TABLETS BY MOUTH EVERY MORNING 60 tablet 3  . pravastatin (PRAVACHOL) 20 MG tablet Take 1 tablet by mouth daily.    . rivaroxaban (XARELTO) 20 MG TABS tablet Take 20 mg by mouth daily with supper.    . torsemide (DEMADEX) 20 MG tablet TAKE 2 TABLETS BY MOUTH ONCE DAILY 60 tablet 3  . valsartan (DIOVAN) 80 MG tablet Take 1 tablet (80 mg total) by mouth daily. 31 tablet 11   No current facility-administered medications for this visit.     No Known Allergies  Past Medical History:  Diagnosis Date  . A-fib ([place])   . Anticoagulated, on Xarelto, Chads2Vas score 2 10/02/2013  . Back pain, chronic 07/13/2012  . CHF (congestive heart failure) ([place])   . 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 CKD (chronic kidney disease), stage II   . Complication of anesthesia    " THEY LOST ME AND BROUGHT ME BACK "  . Hypertension 07/13/2012  . Irregular heart beat   . Lumbar herniated disc   . Morbid obesity ([place])   . Obesity,  morbid, BMI 50 or higher ([place]) 07/13/2012  . 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 Shortness of breath   . Type II diabetes mellitus ([place]) 07/13/2012    Past Surgical History:  Procedure Laterality Date  . ABSCESS DRAINAGE    . COLONOSCOPY  08/22/2012   Procedure: COLONOSCOPY;  Surgeon: Lear Ng, MD;  Location: [facility];  Service: Endoscopy;  Laterality: N/A;  . DENTAL SURGERY      Social History   Tobacco Use  Smoking Status Never Smoker  Smokeless Tobacco Never Used    Social History   Substance and Sexual Activity  Alcohol Use No   Comment: rarely    History reviewed. No pertinent family history.  Review of Systems: Reviewed in current history.  All other systems are negative.Marland Kitchen  Physical Exam: Blood pressure 130/88, pulse 84, height '6\' 1"'  (1.854 m), weight (!) 431 lb (195.5 kg), SpO2 97 %.  GEN:  Super morbidly obese, african american gentleman  HEENT: Normal NECK: No JVD; No carotid bruits, thick neck  LYMPHATICS: No lymphadenopathy CARDIAC:   Irreg. Irreg.  RESPIRATORY:  Clear to auscultation without rales, wheezing or rhonchi  ABDOMEN:  Morbidly obese  MUSCULOSKELETAL:   1-2 + edema  SKIN: Warm and dry NEUROLOGIC:  Alert and oriented x 3   Wt Readings from Last 3 Encounters:  10/07/17 (!) 431 lb (195.5 kg)  09/01/17 (!) 421 lb (191 kg)  08/02/17 (!) 429 lb (194.6 kg)   ECG :   October 07 2017:   Atria fib at HR of 84.   Poor R wave progression   LABORATORY DATA: EKG today with AF with rate of 106.    Lab Results  Component Value Date   WBC 8.2 10/04/2015   HGB 13.6 10/04/2015   HCT 40.9 10/04/2015   PLT 188 10/04/2015   GLUCOSE 198 (H) 10/04/2015   CHOL  02/11/2010    159        ATP III CLASSIFICATION:  <200     mg/dL   Desirable  200-239  mg/dL   Borderline High  >=240    mg/dL   High          TRIG 58 02/11/2010   HDL 59 02/11/2010   LDLCALC  02/11/2010    88        Total Cholesterol/HDL:CHD Risk Coronary Heart Disease Risk  Table                     Men   Women  1/2 Average Risk   3.4   3.3  Average Risk       5.0   4.4  2 X Average Risk   9.6   7.1  3 X Average Risk  23.4   11.0        Use the calculated Patient Ratio above and the CHD Risk Table to determine the patient's CHD Risk.        ATP III CLASSIFICATION (LDL):  <100     mg/dL   Optimal  100-129  mg/dL   Near or Above                    Optimal  130-159  mg/dL   Borderline  160-189  mg/dL   High  >190     mg/dL   Very High   ALT 11 (L) 10/01/2015   AST 18 10/01/2015   NA 136 10/04/2015   K 4.8 10/04/2015   CL 104 10/04/2015   CREATININE 0.93 10/04/2015   BUN 15 10/04/2015   CO2 21 (L) 10/04/2015   TSH 2.421 09/27/2013   PSA 0.22 Test Methodology: Hybritech PSA 02/11/2010   INR 1.45 10/02/2015   HGBA1C 8.6 (H) 11/07/2014   MICROALBUR 0.99 02/11/2010     Echo Study Conclusions from March 2015  - Procedure narrative: Transthoracic echocardiography. The study was technically difficult, as a result of poor acoustic windows, poor sound wave transmission, and body habitus. Intravenous contrast (Definity) was administered. - Left  ventricle: The cavity size was normal. There was moderate concentric hypertrophy. Systolic function was mildly reduced. The estimated ejection fraction was in the range of 45% to 50%. Definity contrast was administered which did not aid in wall motion or EF determination. The study is not technically sufficient to allow evaluation of LV diastolic function. - Mitral valve: Poorly visualized. Mild regurgitation. - Left atrium: The atrium was mildly dilated. - Inferior vena cava: The vessel was dilated; the respirophasic diameter changes were blunted (< 50%); findings are consistent with elevated central venous pressure. - Pericardium, extracardiac: There was no pericardial effusion.   Assessment / Plan:  1. Combined systolic and diastolic congestive heart failure -   His heart failure symptoms seem to  be fairly well controlled.  He is generally short of breath but this is most likely due to his excessive obesity.  Continue current medications.  I strongly advised him to lose weight.  .   2. Paroxysmal atrial fibrillation -   he was in atrial fibrillation today.  He may have developed more persistent atrial fibrillation.  Continue Xarelto.  3. Essential hypertension   Blood pressure is fairly well-controlled today.  4. Morbid obesity -     advised him to lose weight.Body mass index is 56.86 kg/m.   5. Diabetes mellitus    Maurice Moores, MD  10/07/2017 4:20 PM    [facility] [place],  [place] [place], [place]  [address] Pager [phone] Phone: [phone]; Fax: [phone]   0

## 2017-10-09 NOTE — Progress Notes (Signed)
Subjective: Agee presents the office today for follow-up evaluation of swelling to bilateral legs and for possible Unna boots bilaterally.  Distally in the boot itself this morning.  He states he is very happy with his legs now looking much better than when we started.  He states that he feels that the wounds are well-healed.  He still wearing his compression socks.  He has no other concerns today. Denies any systemic complaints such as fevers, chills, nausea, vomiting. No acute changes since last appointment, and no other complaints at this time.   Objective: AAO x3, NAD DP/PT pulses palpable bilaterally, CRT less than 3 seconds There is substantially decreased swelling to the legs and there is currently no ulceration identified bilaterally.  There is no drainage to the legs.  There is no pain with calf compression, erythema or warmth.  No open lesions or pre-ulcerative lesions.  No pain with calf compression, swelling, warmth, erythema  Assessment: Chronic venous insufficiency bilaterally with decreased swelling, healed ulceration.   Plan: -All treatment options discussed with the patient including all alternatives, risks, complications.  -Unna boots were applied bilaterally.  Precautions were advised to remove it should it become painful or increased swelling or discoloration of the toes.  Awaiting compression socks. I ordered these through [employer]. -He has not yet received compression socks. Will contact [employer]. He does not know where he got his old ones.  -RTC 1 week or sooner if needed.  -Patient encouraged to call the office with any questions, concerns, change in symptoms.   [name]

## 2017-10-10 ENCOUNTER — Telehealth: Payer: Self-pay | Admitting: *Deleted

## 2017-10-10 ENCOUNTER — Ambulatory Visit: Payer: Medicare Other | Admitting: Podiatry

## 2017-10-11 ENCOUNTER — Ambulatory Visit (INDEPENDENT_AMBULATORY_CARE_PROVIDER_SITE_OTHER): Payer: Medicare Other | Admitting: Podiatry

## 2017-10-11 DIAGNOSIS — I872 Venous insufficiency (chronic) (peripheral): Secondary | ICD-10-CM | POA: Diagnosis not present

## 2017-10-12 NOTE — Progress Notes (Signed)
Subjective: Maurice Nguyen presents the office today for follow-up evaluation of swelling to bilateral legs and for possible Unna boots bilaterally.  He states that the insomnia is not been having any problems with them.  He states the wounds continue to be healed.  He did bring in the company packaging slip when he got the compression socks previously.  He has no new concerns.  Denies any systemic complaints such as fevers, chills, nausea, vomiting. No acute changes since last appointment, and no other complaints at this time.   Objective: AAO x3, NAD DP/PT pulses palpable bilaterally, CRT less than 3 seconds There is much improved swelling to the legs and there is currently no ulceration identified bilaterally.  There is no drainage to the legs.  There is no pain with calf compression, erythema or warmth.  No open lesions or pre-ulcerative lesions.  No pain with calf compression, swelling, warmth, erythema  Assessment: Chronic venous insufficiency bilaterally with decreased swelling, healed ulceration.   Plan: -All treatment options discussed with the patient including all alternatives, risks, complications.  -Unna boots were applied bilaterally.  We will continue with this to help prevent swelling and ulceration until he gets the compression socks.  Precautions were advised to remove it should it become painful or increased swelling or discoloration of the toes.  Awaiting compression socks. I ordered these through [employer]. -We will try to order the compression socks in the company have been from previously. Information was given to [facility].  -RTC 1 week or sooner if needed.  -Patient encouraged to call the office with any questions, concerns, change in symptoms.   [name]

## 2017-10-13 ENCOUNTER — Telehealth: Payer: Self-pay | Admitting: *Deleted

## 2017-10-13 NOTE — Telephone Encounter (Signed)
Pt states he can not come in for his appt today he is sick would like to reschedule. Sent message to schedulers to contact pt.

## 2017-10-13 NOTE — Telephone Encounter (Signed)
Pt brought in copy of invoice from [employer] stating he received compression hose from them without charge. I spoke with [employer] - Leah and informed pt had given invoice, and stated he had received compression hose for free. Tacey Ruiz states they have "Compression with a Cause" program that can get pt in to receive hose no charge and she will fax the form to me to complete.

## 2017-10-13 NOTE — Telephone Encounter (Signed)
-----   Message from Vivi Barrack, DPM sent at 10/12/2017  4:44 PM EDT ----- If you order his compression socks yet can you please ask him to have a closed in toe ones? He was requesting that type.

## 2017-10-18 ENCOUNTER — Ambulatory Visit (INDEPENDENT_AMBULATORY_CARE_PROVIDER_SITE_OTHER): Payer: Medicare Other | Admitting: Podiatry

## 2017-10-18 ENCOUNTER — Encounter: Payer: Self-pay | Admitting: Podiatry

## 2017-10-18 DIAGNOSIS — I872 Venous insufficiency (chronic) (peripheral): Secondary | ICD-10-CM | POA: Diagnosis not present

## 2017-10-18 NOTE — Telephone Encounter (Signed)
Faxed required form and rx for 2 pair of 20-62mmHg compression socks toe-in style to [employer].

## 2017-10-21 NOTE — Progress Notes (Signed)
Subjective: Jasun presents the office today for follow-up evaluation swelling to bilateral legs.  He is still wearing his compression socks.  He states that he is tolerating Unna boots well the lungs are looking much better.  Denies any drainage or open sores or any redness to his legs.  He has no other concerns denies any systemic complaints such as fevers, chills, nausea, vomiting. No acute changes since last appointment, and no other complaints at this time.   Objective: AAO x3, NAD DP/PT pulses palpable bilaterally, CRT less than 3 seconds There is decrease edema bilaterally there is no open sores, erythema, drainage or any signs of infection noted today.  He is doing much better to the legs.  Chronic venous insufficiency changes present.  No open lesions or pre-ulcerative lesions.  No pain with calf compression, swelling, warmth, erythema  Assessment: Venous insufficiency, swelling bilaterally  Plan: -All treatment options discussed with the patient including all alternatives, risks, complications.  -Unna boots were applied bilaterally and precautions were advised about 1 to remove them or if there is any issues including swelling, pain, discoloration of the toes to go ahead and remove it as soon as possible.  He did sign the final papers today in order to get compression socks. Continue elevation. He is very thankful and is very pleased with how the legs are doing today.  -Patient encouraged to call the office with any questions, concerns, change in symptoms.   [name]

## 2017-10-25 ENCOUNTER — Ambulatory Visit (INDEPENDENT_AMBULATORY_CARE_PROVIDER_SITE_OTHER): Payer: Medicare Other | Admitting: Podiatry

## 2017-10-25 ENCOUNTER — Ambulatory Visit: Payer: Medicare Other | Admitting: Podiatry

## 2017-10-25 DIAGNOSIS — I872 Venous insufficiency (chronic) (peripheral): Secondary | ICD-10-CM

## 2017-10-25 NOTE — Telephone Encounter (Signed)
Faxed Compression with a [employer] form and [employer] form to [employer].

## 2017-10-26 NOTE — Progress Notes (Signed)
Subjective: Moziah presents the office today for follow-up evaluation swelling to bilateral legs.  He is still awaiting his compression socks.  He states that he is tolerating Unna boots well the lungs are continuing to look good and he has not had any easy bruising denies any drainage or wounds to his legs.  He needs to apparently be measured for the compression socks today.  Denies any drainage or open sores or any redness to his legs.  He has no other concerns denies any systemic complaints such as fevers, chills, nausea, vomiting. No acute changes since last appointment, and no other complaints at this time.   Objective: AAO x3, NAD DP/PT pulses palpable bilaterally, CRT less than 3 seconds There is decrease edema bilaterally there is no open sores, erythema, drainage or any signs of infection noted today.  He is doing much better to the legs.  Chronic venous insufficiency changes present.  No open lesions or pre-ulcerative lesions.  No pain with calf compression, swelling, warmth, erythema  Assessment: Venous insufficiency, swelling bilaterally  Plan: -All treatment options discussed with the patient including all alternatives, risks, complications.  -Unna boots were applied bilaterally and precautions were advised about 1 to remove them or if there is any issues including swelling, pain, discoloration of the toes to go ahead and remove it as soon as possible.  We measured his legs today and we faxed the paperwork for the compression stockings. -Patient encouraged to call the office with any questions, concerns, change in symptoms.   *likley nail trim next appointment   R ankle 30 cm L ankle 30 cm R calf 50 cm L calf 53 cm R length 50 cm L length 51 cm Shoe size 15   [name]

## 2017-11-01 ENCOUNTER — Ambulatory Visit (INDEPENDENT_AMBULATORY_CARE_PROVIDER_SITE_OTHER): Payer: Medicare Other | Admitting: Podiatry

## 2017-11-01 ENCOUNTER — Encounter: Payer: Self-pay | Admitting: Podiatry

## 2017-11-01 DIAGNOSIS — E1149 Type 2 diabetes mellitus with other diabetic neurological complication: Secondary | ICD-10-CM

## 2017-11-01 DIAGNOSIS — B351 Tinea unguium: Secondary | ICD-10-CM | POA: Diagnosis not present

## 2017-11-01 DIAGNOSIS — I872 Venous insufficiency (chronic) (peripheral): Secondary | ICD-10-CM

## 2017-11-01 DIAGNOSIS — M79675 Pain in left toe(s): Secondary | ICD-10-CM | POA: Diagnosis not present

## 2017-11-01 DIAGNOSIS — M79674 Pain in right toe(s): Secondary | ICD-10-CM

## 2017-11-01 MED ORDER — CASTELLANI PAINT 1.5 % EX LIQD: 1.0000 "application " | Freq: Every day | CUTANEOUS | 1 refills | Status: DC

## 2017-11-02 NOTE — Progress Notes (Signed)
Subjective: 52 y.o. presents to the office today for painful, elongated, thickened toenails which they cannot trim themselves as well as for follow-up evaluation of swelling, venous insufficiency to his legs.  He is still wearing his compression socks.  Denies any open sores to his legs he denies any drainage.  He wishes to hold off on the [employer] today so he could shower.. Denies any redness or drainage around the nails. Denies any acute changes since last appointment and no new complaints today. Denies any systemic complaints such as fevers, chills, nausea, vomiting.   PCP: Tracey Harries, MD   Objective: AAO 3, NAD DP/PT pulses palpable, CRT less than 3 seconds Sensation decreased with Simms Weinstein monofilament Nails hypertrophic, dystrophic, elongated, brittle, discolored 10. There is tenderness overlying the nails 1-5 bilaterally. There is no surrounding erythema or drainage along the nail sites. No open lesions or pre-ulcerative lesions are identified.  Significantly improved swelling to bilateral lower extremities No other areas of tenderness bilateral lower extremities. No overlying edema, erythema, increased warmth. No pain with calf compression, swelling, warmth, erythema.  Assessment: Patient presents with symptomatic onychomycosis; venous insufficiency   Plan: -Treatment options including alternatives, risks, complications were discussed -Nails sharply debrided 10 without complication/bleeding. -He wishes to hold off on Unna boot today.  Ace bandages were applied from toes to just below the knee said he has a component to shower.  Soap and compression socks. -Discussed daily foot inspection. If there are any changes, to call the office immediately.  -Follow-up in 1 week at his request or sooner if any problems are to arise. In the meantime, encouraged to call the office with any questions, concerns, changes symptoms.  Ovid Curd, DPM

## 2017-11-07 ENCOUNTER — Other Ambulatory Visit: Payer: Medicare Other

## 2017-11-08 ENCOUNTER — Other Ambulatory Visit: Payer: Medicare Other

## 2017-11-08 ENCOUNTER — Ambulatory Visit (INDEPENDENT_AMBULATORY_CARE_PROVIDER_SITE_OTHER): Payer: Medicare Other | Admitting: Orthotics

## 2017-11-08 DIAGNOSIS — M2042 Other hammer toe(s) (acquired), left foot: Secondary | ICD-10-CM | POA: Diagnosis not present

## 2017-11-08 DIAGNOSIS — E1149 Type 2 diabetes mellitus with other diabetic neurological complication: Secondary | ICD-10-CM

## 2017-11-08 DIAGNOSIS — I872 Venous insufficiency (chronic) (peripheral): Secondary | ICD-10-CM | POA: Diagnosis not present

## 2017-11-08 DIAGNOSIS — M2041 Other hammer toe(s) (acquired), right foot: Secondary | ICD-10-CM

## 2017-11-08 DIAGNOSIS — M79671 Pain in right foot: Secondary | ICD-10-CM

## 2017-11-08 NOTE — Addendum Note (Signed)
Addended by: Ria Clock on: 11/08/2017 02:03 PM   Modules accepted: Level of Service

## 2017-11-08 NOTE — Progress Notes (Signed)

## 2017-11-09 DIAGNOSIS — Z6841 Body Mass Index (BMI) 40.0 and over, adult: Secondary | ICD-10-CM

## 2017-11-09 DIAGNOSIS — E785 Hyperlipidemia, unspecified: Secondary | ICD-10-CM

## 2017-11-09 DIAGNOSIS — E1169 Type 2 diabetes mellitus with other specified complication: Secondary | ICD-10-CM | POA: Insufficient documentation

## 2018-03-17 ENCOUNTER — Encounter: Payer: Self-pay | Admitting: Podiatry

## 2018-03-17 ENCOUNTER — Ambulatory Visit (INDEPENDENT_AMBULATORY_CARE_PROVIDER_SITE_OTHER): Payer: Medicare Other | Admitting: Podiatry

## 2018-03-17 DIAGNOSIS — M79674 Pain in right toe(s): Secondary | ICD-10-CM

## 2018-03-17 DIAGNOSIS — E1142 Type 2 diabetes mellitus with diabetic polyneuropathy: Secondary | ICD-10-CM

## 2018-03-17 DIAGNOSIS — B351 Tinea unguium: Secondary | ICD-10-CM

## 2018-03-17 DIAGNOSIS — M79675 Pain in left toe(s): Secondary | ICD-10-CM | POA: Diagnosis not present

## 2018-03-17 DIAGNOSIS — B353 Tinea pedis: Secondary | ICD-10-CM | POA: Diagnosis not present

## 2018-03-18 ENCOUNTER — Encounter: Payer: Self-pay | Admitting: Podiatry

## 2018-03-18 MED ORDER — CASTELLANI PAINT 1.5 % EX LIQD: 1.0000 "application " | Freq: Every day | CUTANEOUS | 1 refills | Status: DC

## 2018-03-18 NOTE — Progress Notes (Signed)
Subjective: Maurice Nguyen is accompanied by his wife today and presents today with diabetes, diabetic neuropathy and cc of painful, discolored, thick toenails which interfere with daily activities and routine tasks. Pain is aggravated when wearing enclosed shoe gear. Pain is getting progressively worse and relieved with periodic professional debridement.  Today, he relates concern of not receiving his compression hose from the [employer] organization. He is not sure what happened, but remembers he was measured on his last visit.  Objective:  Vascular Examination: Capillary refill time <3 seconds x 10 digits Dorsalis pedis and Posterior tibial pulses present b/l No digital hair x 10 digits Skin temperature gradient normal bl +LE edema, b/l, nonpitting in nature. No erythema, no weeping, no blisters.  Measurements of LE: Upper leg b/l: 19" Calf b/l 17 1/2" Ankles: 11 1/2"  Dermatological Examination: Skin noted to be dry and flaky anterior aspect of both legs and plantar/peripheral aspects of both feet.  Toenails 1-5 b/l discolored, thick, dystrophic with subungual debris and pain with palpation to nailbeds due to thickness of nails.  Interdigital maceration 4th webspace b/l. No breaks in skin. White color. No drainage, no erythema, no edema. No pain on palpation.  Musculoskeletal: Muscle strength 5/5 to all LE muscle groups  Neurological: Sensation diminished with 10 gram monofilament. Vibratory sensation diminished  Assessment: 1. Painful onychomycosis toenails 1-5 b/l 2. NIDDM with Diabetic neuropathy 3. Interdigital tinea pedis 4th webspace b/l  Plan: 1. Continue diabetic foot care principles.  2. Toenails 1-5 b/l were debrided in length and girth without iatrogenic bleeding. 3. Patient to continue wearing  diabetic shoes daily 4. Patient to report any pedal injuries to medical professional  5. His lower extremities were measured on today egarding  Compression hose, Maurice Nguyen says he cannot afford expensive ones from a medical supply company. We gave him information for [employer], [address] [place], [place], [place] [address]. This company sells affordable compression hose. 6. Castellani's paint applied to 4th webspace b/l. Rx refilled for Castellani's paint for interdigital tinea pedis 7. Follow up 3 months. Patient/POA to call should there be a concern in the interim.

## 2018-06-16 ENCOUNTER — Ambulatory Visit: Payer: Medicare Other | Admitting: Podiatry

## 2018-06-28 ENCOUNTER — Ambulatory Visit (INDEPENDENT_AMBULATORY_CARE_PROVIDER_SITE_OTHER): Payer: Medicare Other | Admitting: Podiatry

## 2018-06-28 ENCOUNTER — Encounter: Payer: Self-pay | Admitting: Podiatry

## 2018-06-28 DIAGNOSIS — B351 Tinea unguium: Secondary | ICD-10-CM

## 2018-06-28 DIAGNOSIS — M79674 Pain in right toe(s): Secondary | ICD-10-CM | POA: Diagnosis not present

## 2018-06-28 DIAGNOSIS — M79675 Pain in left toe(s): Secondary | ICD-10-CM

## 2018-06-28 DIAGNOSIS — E1142 Type 2 diabetes mellitus with diabetic polyneuropathy: Secondary | ICD-10-CM

## 2018-06-28 NOTE — Patient Instructions (Addendum)
Diabetes and Foot Care Diabetes may cause you to have problems because of poor blood supply (circulation) to your feet and legs. This may cause the skin on your feet to become thinner, break easier, and heal more slowly. Your skin may become dry, and the skin may peel and crack. You may also have nerve damage in your legs and feet causing decreased feeling in them. You may not notice minor injuries to your feet that could lead to infections or more serious problems. Taking care of your feet is one of the most important things you can do for yourself. Follow these instructions at home:  Wear shoes at all times, even in the house. Do not go barefoot. Bare feet are easily injured.  Check your feet daily for blisters, cuts, and redness. If you cannot see the bottom of your feet, use a mirror or ask someone for help.  Wash your feet with warm water (do not use hot water) and mild soap. Then pat your feet and the areas between your toes until they are completely dry. Do not soak your feet as this can dry your skin.  Apply a moisturizing lotion or petroleum jelly (that does not contain alcohol and is unscented) to the skin on your feet and to dry, brittle toenails. Do not apply lotion between your toes.  Trim your toenails straight across. Do not dig under them or around the cuticle. File the edges of your nails with an emery board or nail file.  Do not cut corns or calluses or try to remove them with medicine.  Wear clean socks or stockings every day. Make sure they are not too tight. Do not wear knee-high stockings since they may decrease blood flow to your legs.  Wear shoes that fit properly and have enough cushioning. To break in new shoes, wear them for just a few hours a day. This prevents you from injuring your feet. Always look in your shoes before you put them on to be sure there are no objects inside.  Do not cross your legs. This may decrease the blood flow to your feet.  If you find a  minor scrape, cut, or break in the skin on your feet, keep it and the skin around it clean and dry. These areas may be cleansed with mild soap and water. Do not cleanse the area with peroxide, alcohol, or iodine.  When you remove an adhesive bandage, be sure not to damage the skin around it.  If you have a wound, look at it several times a day to make sure it is healing.  Do not use heating pads or hot water bottles. They may burn your skin. If you have lost feeling in your feet or legs, you may not know it is happening until it is too late.  Make sure your health care provider performs a complete foot exam at least annually or more often if you have foot problems. Report any cuts, sores, or bruises to your health care provider immediately. Contact a health care provider if:  You have an injury that is not healing.  You have cuts or breaks in the skin.  You have an ingrown nail.  You notice redness on your legs or feet.  You feel burning or tingling in your legs or feet.  You have pain or cramps in your legs and feet.  Your legs or feet are numb.  Your feet always feel cold. Get help right away if:  There is increasing   redness, swelling, or pain in or around a wound.  There is a red line that goes up your leg.  Pus is coming from a wound.  You develop a fever or as directed by your health care provider.  You notice a bad smell coming from an ulcer or wound. This information is not intended to replace advice given to you by your health care provider. Make sure you discuss any questions you have with your health care provider. Document Released: 07/09/2000 Document Revised: 12/18/2015 Document Reviewed: 12/19/2012 Elsevier Interactive Patient Education  2017 [employer]  Edema Edema is when you have too much fluid in your body or under your skin. Edema may make your legs, feet, and ankles swell up. Swelling is also common in looser tissues, like around your eyes. This is a  common condition. It gets more common as you get older. There are many possible causes of edema. Eating too much salt (sodium) and being on your feet or sitting for a long time can cause edema in your legs, feet, and ankles. Hot weather may make edema worse. Edema is usually painless. Your skin may look swollen or shiny. Follow these instructions at home:  Keep the swollen body part raised (elevated) above the level of your heart when you are sitting or lying down.  Do not sit still or stand for a long time.  Do not wear tight clothes. Do not wear garters on your upper legs.  Exercise your legs. This can help the swelling go down.  Wear elastic bandages or support stockings as told by your doctor.  Eat a low-salt (low-sodium) diet to reduce fluid as told by your doctor.  Depending on the cause of your swelling, you may need to limit how much fluid you drink (fluid restriction).  Take over-the-counter and prescription medicines only as told by your doctor. Contact a doctor if:  Treatment is not working.  You have heart, liver, or kidney disease and have symptoms of edema.  You have sudden and unexplained weight gain. Get help right away if:  You have shortness of breath or chest pain.  You cannot breathe when you lie down.  You have pain, redness, or warmth in the swollen areas.  You have heart, liver, or kidney disease and get edema all of a sudden.  You have a fever and your symptoms get worse all of a sudden. Summary  Edema is when you have too much fluid in your body or under your skin.  Edema may make your legs, feet, and ankles swell up. Swelling is also common in looser tissues, like around your eyes.  Raise (elevate) the swollen body part above the level of your heart when you are sitting or lying down.  Follow your doctor's instructions about diet and how much fluid you can drink (fluid restriction). This information is not intended to replace advice given to you  by your health care provider. Make sure you discuss any questions you have with your health care provider. Document Released: 12/29/2007 Document Revised: 07/30/2016 Document Reviewed: 07/30/2016 Elsevier Interactive Patient Education  2017 Elsevier Inc.  

## 2018-07-11 ENCOUNTER — Encounter: Payer: Self-pay | Admitting: Podiatry

## 2018-07-11 NOTE — Progress Notes (Signed)
Subjective: Maurice Nguyen presents today for preventative foot care follow up with history of diabetic neuropathy with cc of painful, mycotic toenails.  Pain is aggravated when wearing enclosed shoe gear and relieved with periodic professional debridement.  Patient has peripheral neuropathy managed with gabapentin and current dosage appears to be adequate for his symptoms.  Maurice Harries, MD is his PCP.  Maurice Nguyen states he did not receive his prescription for compression stockings on last visit.  Current Outpatient Medications:  .  carvedilol (COREG) 6.25 MG tablet, Take 1 tablet (6.25 mg total) by mouth 2 (two) times daily., Disp: 60 tablet, Rfl: 11 .  Castellani Paint 1.5 % LIQD, Apply 1 application topically daily., Disp: 1 Bottle, Rfl: 1 .  gabapentin (NEURONTIN) 300 MG capsule, Take 1 capsule by mouth daily as needed for pain., Disp: , Rfl:  .  glipiZIDE (GLUCOTROL) 10 MG tablet, Take 1 tablet (10 mg total) by mouth 2 (two) times daily before a meal., Disp: 60 tablet, Rfl: 1 .  Insulin Glargine (BASAGLAR KWIKPEN [place]), Start at 20 units every morning.  Increase by 2 units every other day to get your fasting morning blood glucose down to 120., Disp: , Rfl:  .  metFORMIN (GLUCOPHAGE) 1000 MG tablet, take 1 tablet by mouth twice a day with meals, Disp: , Rfl:  .  nitroGLYCERIN (NITROSTAT) 0.4 MG SL tablet, Place 1 tablet (0.4 mg total) under the tongue every 5 (five) minutes x 3 doses as needed for chest pain., Disp: 30 tablet, Rfl: 12 .  omeprazole (PRILOSEC) 20 MG capsule, Take 1 capsule (20 mg total) by mouth daily. For 1 month, Disp: 30 capsule, Rfl: 0 .  oxyCODONE-acetaminophen (PERCOCET/ROXICET) 5-325 MG tablet, Take 1 tablet by mouth every 6 (six) hours as needed for moderate pain., Disp: 15 tablet, Rfl: 0 .  potassium chloride SA (K-DUR,KLOR-CON) 20 MEQ tablet, TAKE 2 TABLETS BY MOUTH EVERY MORNING, Disp: 60 tablet, Rfl: 3 .  pravastatin (PRAVACHOL) 20 MG tablet, Take 1 tablet by  mouth daily., Disp: , Rfl:  .  rivaroxaban (XARELTO) 20 MG TABS tablet, Take 20 mg by mouth daily with supper., Disp: , Rfl:  .  torsemide (DEMADEX) 20 MG tablet, TAKE 2 TABLETS BY MOUTH ONCE DAILY, Disp: 60 tablet, Rfl: 3 .  valsartan (DIOVAN) 80 MG tablet, Take 1 tablet (80 mg total) by mouth daily., Disp: 31 tablet, Rfl: 11  NKDA  Vascular Examination: Capillary refill time <3 seconds x 10 digits Dorsalis pedis and Posterior tibial pulses present b/l No digital hair x 10 digits Skin temperature gradient normal bl +2-3 LE edema, b/l, nonpitting in nature. No erythema, no weeping, no blisters.  Dermatological Examination: Skin continues to be dry and flaky anterior aspect of both legs and plantar/peripheral aspects of both feet.  Toenails 1-5 b/l discolored, thick, dystrophic with subungual debris and pain with palpation to nailbeds due to thickness of nails.  Interdigital maceration 4th webspace b/l. No breaks in skin. White color. No drainage, no erythema, no edema. No pain on palpation.  Musculoskeletal: Muscle strength 5/5 to all LE muscle groups  Neurological: Sensation diminished with 10 gram monofilament. Vibratory sensation diminished  Assessment: 1. Painful onychomycosis toenails 1-5 b/l 2. NIDDM with Diabetic neuropathy 3. Venous insuffiency with LE edema 3.  Interdigital tinea pedis 4th webspace b/l  Plan: 1. Handwritten prescription given to him on today for [facility] for custom compression stockings and to refill prn.Prescription has locations written on it. 2. Continue diabetic foot  care principles. Literature dispensed. 3. For dry skin, advised Maurice Nguyen to moisturize his feet/legs daily. 4. Toenails 1-5 b/l were debrided in length and girth without iatrogenic bleeding. 5. Patient to continue wearing  diabetic shoes daily 6. Patient to report any pedal injuries to medical professional  7. Castellani's paint applied to 4th webspace b/l. Continue  Castellani's paint for interdigital tinea pedis. Patient educated on importance of drying between his toes after bathing/showering. 8. Follow up 3 months. Patient/POA to call should there be a concern in the interim.

## 2018-10-19 ENCOUNTER — Other Ambulatory Visit: Payer: Medicare Other | Admitting: Orthotics

## 2018-10-19 ENCOUNTER — Ambulatory Visit: Payer: Medicare Other | Admitting: Podiatry

## 2018-11-30 ENCOUNTER — Ambulatory Visit (INDEPENDENT_AMBULATORY_CARE_PROVIDER_SITE_OTHER): Payer: Medicare HMO | Admitting: Podiatry

## 2018-11-30 ENCOUNTER — Other Ambulatory Visit: Payer: Self-pay

## 2018-11-30 ENCOUNTER — Ambulatory Visit: Payer: Medicare HMO | Admitting: Orthotics

## 2018-11-30 VITALS — Temp 97.7°F

## 2018-11-30 DIAGNOSIS — M79671 Pain in right foot: Secondary | ICD-10-CM

## 2018-11-30 DIAGNOSIS — E1142 Type 2 diabetes mellitus with diabetic polyneuropathy: Secondary | ICD-10-CM

## 2018-11-30 DIAGNOSIS — M2042 Other hammer toe(s) (acquired), left foot: Secondary | ICD-10-CM

## 2018-11-30 DIAGNOSIS — E1169 Type 2 diabetes mellitus with other specified complication: Secondary | ICD-10-CM | POA: Diagnosis not present

## 2018-11-30 DIAGNOSIS — E1149 Type 2 diabetes mellitus with other diabetic neurological complication: Secondary | ICD-10-CM

## 2018-11-30 DIAGNOSIS — B351 Tinea unguium: Secondary | ICD-10-CM

## 2018-11-30 DIAGNOSIS — M79675 Pain in left toe(s): Principal | ICD-10-CM

## 2018-11-30 DIAGNOSIS — M2041 Other hammer toe(s) (acquired), right foot: Secondary | ICD-10-CM

## 2018-11-30 DIAGNOSIS — M2142 Flat foot [pes planus] (acquired), left foot: Secondary | ICD-10-CM

## 2018-11-30 DIAGNOSIS — M2141 Flat foot [pes planus] (acquired), right foot: Secondary | ICD-10-CM

## 2018-11-30 DIAGNOSIS — M79674 Pain in right toe(s): Principal | ICD-10-CM

## 2018-11-30 NOTE — Progress Notes (Signed)
Patient presents today for diabetic shoe measurement and foam casting.  Goals of diabetic shoes/inserts to offer protection from conditions secondary to DM2, offer relief from sheer forces that could lead to ulcerations, protect the foot, and offer greater stability. Patient is under supervision of DPM Physician managing patients DM2: Patient has following documented conditions to qualify for diabetic shoes/inserts: Patient measured with brannock device: 

## 2018-12-24 NOTE — Progress Notes (Signed)
Subjective:  Patient ID: Maurice Nguyen, male    DOB: 16-Aug-1965,  MRN: [id number]  Chief Complaint  Patient presents with  . Nail Problem    Nail trim 1-5 bilateral  . Wound Check    Right dorsal foot abbrasion, pt states caused by his compression stockings rubbing.    53 y.o. male presents  for diabetic foot care. Last AMBS was unknown. Reports numbness and tingling in their feet. Denies cramping in legs and thighs.  Review of Systems: Negative except as noted in the HPI. Denies N/V/F/Ch.  Past Medical History:  Diagnosis Date  . A-fib (HCC)   . Anticoagulated, on Xarelto, Chads2Vas score 2 10/02/2013  . Back pain, chronic 07/13/2012  . CHF (congestive heart failure) (HCC)   . 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 CKD (chronic kidney disease), stage II   . Complication of anesthesia    " THEY LOST ME AND BROUGHT ME BACK "  . Hypertension 07/13/2012  . Irregular heart beat   . Lumbar herniated disc   . Morbid obesity (HCC)   . Obesity, morbid, BMI 50 or higher (HCC) 07/13/2012  . 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 Shortness of breath   . Type II diabetes mellitus (HCC) 07/13/2012    Current Outpatient Medications:  .  carvedilol (COREG) 6.25 MG tablet, Take 1 tablet (6.25 mg total) by mouth 2 (two) times daily., Disp: 60 tablet, Rfl: 11 .  Castellani Paint 1.5 % LIQD, Apply 1 application topically daily., Disp: 1 Bottle, Rfl: 1 .  gabapentin (NEURONTIN) 300 MG capsule, Take 1 capsule by mouth daily as needed for pain., Disp: , Rfl:  .  glipiZIDE (GLUCOTROL) 10 MG tablet, Take 1 tablet (10 mg total) by mouth 2 (two) times daily before a meal., Disp: 60 tablet, Rfl: 1 .  Insulin Glargine (BASAGLAR KWIKPEN [place]), Start at 20 units every morning.  Increase by 2 units every other day to get your fasting morning blood glucose down to 120., Disp: , Rfl:  .  JARDIANCE 10 MG TABS tablet, Take 10 mg by mouth daily., Disp: , Rfl:  .  liraglutide (VICTOZA)  18 MG/3ML SOPN, Inject into the skin., Disp: , Rfl:  .  metFORMIN (GLUCOPHAGE) 1000 MG tablet, take 1 tablet by mouth twice a day with meals, Disp: , Rfl:  .  nitroGLYCERIN (NITROSTAT) 0.4 MG SL tablet, Place 1 tablet (0.4 mg total) under the tongue every 5 (five) minutes x 3 doses as needed for chest pain., Disp: 30 tablet, Rfl: 12 .  omeprazole (PRILOSEC) 20 MG capsule, Take 1 capsule (20 mg total) by mouth daily. For 1 month, Disp: 30 capsule, Rfl: 0 .  oxyCODONE-acetaminophen (PERCOCET/ROXICET) 5-325 MG tablet, Take 1 tablet by mouth every 6 (six) hours as needed for moderate pain., Disp: 15 tablet, Rfl: 0 .  potassium chloride SA (K-DUR,KLOR-CON) 20 MEQ tablet, TAKE 2 TABLETS BY MOUTH EVERY MORNING, Disp: 60 tablet, Rfl: 3 .  pravastatin (PRAVACHOL) 20 MG tablet, Take 1 tablet by mouth daily., Disp: , Rfl:  .  rivaroxaban (XARELTO) 20 MG TABS tablet, Take 20 mg by mouth daily with supper., Disp: , Rfl:  .  torsemide (DEMADEX) 20 MG tablet, TAKE 2 TABLETS BY MOUTH ONCE DAILY, Disp: 60 tablet, Rfl: 3 .  valsartan (DIOVAN) 80 MG tablet, Take 1 tablet (80 mg total) by mouth daily., Disp: 31 tablet, Rfl: 11  Social History   Tobacco Use  Smoking Status Never Smoker  Smokeless Tobacco Never Used    No Known Allergies Objective:   Vitals:   11/30/18 0948  Temp: 97.7 F (36.5 C)   There is no height or weight on file to calculate BMI. Constitutional Well developed. Well nourished.  Vascular Dorsalis pedis pulses present 1+ bilaterally  Posterior tibial pulses present 1+ bilaterally  Pedal hair growth normal. Capillary refill normal to all digits.  No cyanosis or clubbing noted.  Neurologic Normal speech. Oriented to person, place, and time. Epicritic sensation to light touch grossly present bilaterally. Protective sensation with 5.07 monofilament  absent bilaterally.  Dermatologic Nails elongated, thickened, dystrophic. No open wounds. No skin lesions.  Orthopedic: Normal joint  ROM without pain or crepitus bilaterally. No visible deformities. No bony tenderness.   Assessment:   1. Onychomycosis of multiple toenails with type 2 diabetes mellitus and peripheral neuropathy (HCC)    Plan:  Patient was evaluated and treated and all questions answered.  Diabetes with DPN, Onychomycosis -Educated on diabetic footcare. Diabetic risk level 1 -Nails x10 debrided sharply and manually with large nail nipper and rotary burr.   Procedure: Nail Debridement Rationale: Patient meets criteria for routine foot care due to DPN Type of Debridement: manual, sharp debridement. Instrumentation: Nail nipper, rotary burr. Number of Nails: 10  Return in about 3 months (around 03/02/2019).

## 2019-01-05 ENCOUNTER — Telehealth: Payer: Self-pay | Admitting: Podiatry

## 2019-01-05 NOTE — Telephone Encounter (Signed)
Pt called yesterday afternoon checking on diabetic shoe status. I told pt I needed to call to see what was going on and I would call him back.  I checked late yesterday afternoon and they are working on the inserts and the shoes and should be shipping by end of next week hopefully ([employer]af[employer] is behind due to covid 19 furlough's) but I would call him when they come in to schedule an appt to see Maurice Nguyen.

## 2019-01-22 ENCOUNTER — Other Ambulatory Visit: Payer: Self-pay

## 2019-01-22 ENCOUNTER — Ambulatory Visit: Payer: Medicare HMO | Admitting: Orthotics

## 2019-01-22 DIAGNOSIS — M2041 Other hammer toe(s) (acquired), right foot: Secondary | ICD-10-CM

## 2019-01-22 DIAGNOSIS — B351 Tinea unguium: Secondary | ICD-10-CM

## 2019-01-22 DIAGNOSIS — M2042 Other hammer toe(s) (acquired), left foot: Secondary | ICD-10-CM

## 2019-01-22 DIAGNOSIS — M2141 Flat foot [pes planus] (acquired), right foot: Secondary | ICD-10-CM

## 2019-01-22 DIAGNOSIS — M2142 Flat foot [pes planus] (acquired), left foot: Secondary | ICD-10-CM

## 2019-01-22 DIAGNOSIS — M79675 Pain in left toe(s): Secondary | ICD-10-CM

## 2019-01-22 NOTE — Progress Notes (Signed)
Due to swelling, need to order shoe one size bigger and wider: 15 6E

## 2019-02-01 ENCOUNTER — Telehealth: Payer: Self-pay | Admitting: Podiatry

## 2019-02-01 NOTE — Telephone Encounter (Signed)
Pt left message checking on diabetic shoes that were reordered.  I returned call and left a message that shoes should be here beginning of week and I will call when they come in.

## 2019-02-08 ENCOUNTER — Ambulatory Visit (INDEPENDENT_AMBULATORY_CARE_PROVIDER_SITE_OTHER): Payer: Medicare HMO | Admitting: Podiatry

## 2019-02-08 ENCOUNTER — Ambulatory Visit (INDEPENDENT_AMBULATORY_CARE_PROVIDER_SITE_OTHER): Payer: Medicare HMO | Admitting: Orthotics

## 2019-02-08 ENCOUNTER — Other Ambulatory Visit: Payer: Self-pay

## 2019-02-08 DIAGNOSIS — L02612 Cutaneous abscess of left foot: Secondary | ICD-10-CM

## 2019-02-08 DIAGNOSIS — M2042 Other hammer toe(s) (acquired), left foot: Secondary | ICD-10-CM

## 2019-02-08 DIAGNOSIS — M2141 Flat foot [pes planus] (acquired), right foot: Secondary | ICD-10-CM

## 2019-02-08 DIAGNOSIS — M2041 Other hammer toe(s) (acquired), right foot: Secondary | ICD-10-CM | POA: Diagnosis not present

## 2019-02-08 DIAGNOSIS — M79674 Pain in right toe(s): Secondary | ICD-10-CM | POA: Diagnosis not present

## 2019-02-08 DIAGNOSIS — M79675 Pain in left toe(s): Secondary | ICD-10-CM | POA: Diagnosis not present

## 2019-02-08 DIAGNOSIS — L03032 Cellulitis of left toe: Secondary | ICD-10-CM | POA: Diagnosis not present

## 2019-02-08 DIAGNOSIS — L97901 Non-pressure chronic ulcer of unspecified part of unspecified lower leg limited to breakdown of skin: Secondary | ICD-10-CM

## 2019-02-08 DIAGNOSIS — E1142 Type 2 diabetes mellitus with diabetic polyneuropathy: Secondary | ICD-10-CM | POA: Diagnosis not present

## 2019-02-08 DIAGNOSIS — L97929 Non-pressure chronic ulcer of unspecified part of left lower leg with unspecified severity: Secondary | ICD-10-CM | POA: Diagnosis not present

## 2019-02-08 DIAGNOSIS — B351 Tinea unguium: Secondary | ICD-10-CM | POA: Diagnosis not present

## 2019-02-08 DIAGNOSIS — I872 Venous insufficiency (chronic) (peripheral): Secondary | ICD-10-CM

## 2019-02-08 DIAGNOSIS — M2142 Flat foot [pes planus] (acquired), left foot: Secondary | ICD-10-CM

## 2019-02-08 MED ORDER — MUPIROCIN 2 % EX OINT: 1.0000 "application " | TOPICAL_OINTMENT | Freq: Two times a day (BID) | CUTANEOUS | 2 refills | Status: DC

## 2019-02-08 MED ORDER — DOXYCYCLINE HYCLATE 100 MG PO TABS: 100.0000 mg | ORAL_TABLET | Freq: Two times a day (BID) | ORAL | 0 refills | Status: DC

## 2019-02-08 NOTE — Progress Notes (Signed)

## 2019-02-08 NOTE — Progress Notes (Signed)
Subjective: 53 year old male presents the office today.  He recently presented to pick up diabetic shoes have not been going to be seen for concerns of a wound of the front of his right ankle.  He states that he has noticed [place] drainage as well as discomfort of the wound on the father ankle.  Also has noticed a wound on the left leg.  States this is been swelling.  He had a small moderate drainage on the left leg but is clear.  No pus.  Surrounding erythema, ascending cellulitis.  No fluctuation crepitation. Denies any systemic complaints such as fevers, chills, nausea, vomiting. No acute changes since last appointment, and no other complaints at this time.   Objective: AAO x3, NAD DP/PT pulses palpable bilaterally, CRT less than 3 seconds Superficial wound present on the anterior aspect the left leg with a granular wound base.  No drainage or pus identified today there is no fluctuation crepitation malodor.  There is a blister present on the left fifth digit encompassing the toenail and the distal aspect of toe.  Upon debridement there is a superficial abscess.  The nail unfortunately did come off.  There was no bleeding.  Underlying skin intact. Also hyperkeratotic tissue, ulceration on the anterior aspect of right ankle.  No probing, undermining or tunneling. Nails are hypertrophic, dystrophic, brittle, discolored, elongated 10. No surrounding redness or drainage.  Subjective tenderness to nails 1-5 bilaterally. No open lesions or pre-ulcerative lesions are identified today. No open lesions or pre-ulcerative lesions.  No pain with calf compression, swelling, warmth, erythema  Assessment: Fifth toe superficial abscess with bilateral lower extremity ulcerations; symptomatic onychomycosis  Plan: -All treatment options discussed with the patient including all alternatives, risks, complications.  -The nails were sharply debrided x9 without any complications or bleeding.  There was a blister on the  left fifth toe I did drain there was superficial abscess present.  The nail did come off.  Recommend antibiotic ointment dressing changes daily. An unna boot was applied for the left lower extremity given the venous wound.  Precautions were advised on when to remove this. -Recommended mupirocin ointment dressing changes on the right ankle wound daily. -Doxycycline -Patient encouraged to call the office with any questions, concerns, change in symptoms.   Trula Slade DPM

## 2019-02-22 ENCOUNTER — Other Ambulatory Visit: Payer: Self-pay

## 2019-02-22 ENCOUNTER — Encounter: Payer: Self-pay | Admitting: Podiatry

## 2019-02-22 ENCOUNTER — Ambulatory Visit (INDEPENDENT_AMBULATORY_CARE_PROVIDER_SITE_OTHER): Payer: Medicare HMO | Admitting: Podiatry

## 2019-02-22 DIAGNOSIS — L97909 Non-pressure chronic ulcer of unspecified part of unspecified lower leg with unspecified severity: Secondary | ICD-10-CM

## 2019-02-22 DIAGNOSIS — I872 Venous insufficiency (chronic) (peripheral): Secondary | ICD-10-CM | POA: Diagnosis not present

## 2019-02-22 DIAGNOSIS — E1142 Type 2 diabetes mellitus with diabetic polyneuropathy: Secondary | ICD-10-CM

## 2019-02-22 DIAGNOSIS — I83009 Varicose veins of unspecified lower extremity with ulcer of unspecified site: Secondary | ICD-10-CM

## 2019-02-22 NOTE — Progress Notes (Signed)
Subjective: 53 year old male presents the office today for follow-up evaluation of wounds to both legs.  He has been doing well and he removed the unna boot on the left leg today.  He is keeping keeping antibiotic ointment on the wound on the right side daily and still doing better.  Pain is improved.  No drainage or pus. Denies any systemic complaints such as fevers, chills, nausea, vomiting. No acute changes since last appointment, and no other complaints at this time.   Objective: AAO x3, NAD DP/PT pulses palpable bilaterally, CRT less than 3 seconds The wound that was present on the anterior aspect of the left leg is healed.  Macerated tissue left fifth toe and some of the granulation tissue remains but overall doing much better and almost healed.  The wound on the anterior ankle on the right side is more superficial granular wound base and some mild macerated tissue.  Chronic bilateral lower extremity edema.  On the proximal aspect of the right leg there is a small superficial area of skin breakdown with clear drainage due to venous insufficiency.  No open lesions or pre-ulcerative lesions.  No pain with calf compression, swelling, warmth, erythema  Assessment: Bilateral ulcerations with venous insufficiency  Plan: -All treatment options discussed with the patient including all alternatives, risks, complications.  -Overall he is doing much better.  Unna boots were applied bilaterally.  Precautions were advised on when to remove this.  Encouraged elevation. -Patient encouraged to call the office with any questions, concerns, change in symptoms.   Trula Slade DPM

## 2019-02-22 NOTE — Patient Instructions (Signed)
Remove the unna boot (compression wrap) in 5 days or sooner if there is any increase in pain, swelling or if there is any discoloration to the toes.

## 2019-03-01 ENCOUNTER — Encounter: Payer: Self-pay | Admitting: Podiatry

## 2019-03-01 ENCOUNTER — Other Ambulatory Visit: Payer: Self-pay

## 2019-03-01 ENCOUNTER — Ambulatory Visit (INDEPENDENT_AMBULATORY_CARE_PROVIDER_SITE_OTHER): Payer: Medicare HMO | Admitting: Podiatry

## 2019-03-01 VITALS — Temp 96.0°F

## 2019-03-01 DIAGNOSIS — I872 Venous insufficiency (chronic) (peripheral): Secondary | ICD-10-CM

## 2019-03-01 DIAGNOSIS — L97521 Non-pressure chronic ulcer of other part of left foot limited to breakdown of skin: Secondary | ICD-10-CM | POA: Diagnosis not present

## 2019-03-01 DIAGNOSIS — I83009 Varicose veins of unspecified lower extremity with ulcer of unspecified site: Secondary | ICD-10-CM | POA: Diagnosis not present

## 2019-03-01 DIAGNOSIS — L97909 Non-pressure chronic ulcer of unspecified part of unspecified lower leg with unspecified severity: Secondary | ICD-10-CM

## 2019-03-06 ENCOUNTER — Ambulatory Visit: Payer: Medicare HMO | Admitting: Podiatry

## 2019-03-09 ENCOUNTER — Ambulatory Visit (INDEPENDENT_AMBULATORY_CARE_PROVIDER_SITE_OTHER): Payer: Medicare HMO | Admitting: Podiatry

## 2019-03-09 ENCOUNTER — Other Ambulatory Visit: Payer: Medicare HMO

## 2019-03-09 ENCOUNTER — Other Ambulatory Visit: Payer: Self-pay

## 2019-03-09 DIAGNOSIS — S90425A Blister (nonthermal), left lesser toe(s), initial encounter: Secondary | ICD-10-CM | POA: Diagnosis not present

## 2019-03-09 DIAGNOSIS — I872 Venous insufficiency (chronic) (peripheral): Secondary | ICD-10-CM | POA: Diagnosis not present

## 2019-03-13 NOTE — Progress Notes (Signed)
Subjective: 53 year old male presents the office today for follow-up evaluation of wounds to both legs.  He is doing much better overall.  He took the boot off this morning.  Swelling is improved and the wounds are improved as well.  Denies any fevers, chills, nausea, vomiting.  No calf pain, chest pain, shortness of breath.  Objective: AAO x3, NAD There is still chronic edema present bilaterally however this is improved.  Small wound on the anterior aspect of the left leg as well as a small posterior mild right leg swelling.  There is no purulence there is no erythema, ascending cellulitis.  No fluctuation crepitation malodor.  The wound the left fifth toe is almost healed.  Small granulation tissue still present.  No signs of infection. No pain with calf compression, swelling, warmth, erythema  Assessment: Bilateral ulcerations with venous insufficiency  Plan: -All treatment options discussed with the patient including all alternatives, risks, complications.  -He is continued to improve.  Unna boots were applied bilaterally.  Precautions were advised on when to remove this.  Encouraged elevation. -Patient encouraged to call the office with any questions, concerns, change in symptoms.   Trula Slade DPM

## 2019-03-15 ENCOUNTER — Telehealth: Payer: Self-pay

## 2019-03-15 NOTE — Telephone Encounter (Signed)
Attempted to call and offer pt follow up appt, pt did not answer.

## 2019-03-16 ENCOUNTER — Other Ambulatory Visit: Payer: Self-pay

## 2019-03-16 ENCOUNTER — Other Ambulatory Visit: Payer: Self-pay | Admitting: Podiatry

## 2019-03-16 ENCOUNTER — Ambulatory Visit (INDEPENDENT_AMBULATORY_CARE_PROVIDER_SITE_OTHER): Payer: Medicare HMO | Admitting: Podiatry

## 2019-03-16 ENCOUNTER — Ambulatory Visit (INDEPENDENT_AMBULATORY_CARE_PROVIDER_SITE_OTHER): Payer: Medicare HMO

## 2019-03-16 DIAGNOSIS — M79672 Pain in left foot: Secondary | ICD-10-CM

## 2019-03-16 DIAGNOSIS — L97521 Non-pressure chronic ulcer of other part of left foot limited to breakdown of skin: Secondary | ICD-10-CM

## 2019-03-16 DIAGNOSIS — L97919 Non-pressure chronic ulcer of unspecified part of right lower leg with unspecified severity: Secondary | ICD-10-CM

## 2019-03-16 DIAGNOSIS — I83009 Varicose veins of unspecified lower extremity with ulcer of unspecified site: Secondary | ICD-10-CM

## 2019-03-16 DIAGNOSIS — I872 Venous insufficiency (chronic) (peripheral): Secondary | ICD-10-CM

## 2019-03-16 NOTE — Progress Notes (Signed)
Subjective: 53 year old male presents the office today for follow-up evaluation of wounds to both legs.  He thinks he is doing much better.  He remove the Unna boots today.  Thinks the wound of the fifth toe is almost healed.  Denies any drainage or pus. Denies any fevers, chills, nausea, vomiting.  No calf pain, chest pain, shortness of breath.  Objective: AAO x3, NAD So chronic edema present bilaterally however much improved.  The wounds have almost completely healed.  There is a small area of superficial breakdown posterior to the left leg on the anterior aspect as well as a small in the posterior right leg.  There is no drainage or pus no fluctuation crepitation or any malodor.  The wound on the fifth toe appears to be healed.  Hyperkeratotic tissue present.  Upon debridement no ongoing ulceration or signs of infection No pain with calf compression, swelling, warmth, erythema  Assessment: Bilateral ulcerations with venous insufficiency; left fifth toe ulceration  Plan: -All treatment options discussed with the patient including all alternatives, risks, complications.  -X-rays obtained reviewed.  No evidence of acute osteomyelitis or soft tissue emphysema spelt fifth toe. -Unna boots were applied bilaterally.  Precautions were advised on when to remove this.  Encouraged elevation. -Patient encouraged to call the office with any questions, concerns, change in symptoms.   Trula Slade DPM

## 2019-03-23 ENCOUNTER — Ambulatory Visit (INDEPENDENT_AMBULATORY_CARE_PROVIDER_SITE_OTHER): Payer: Medicare HMO | Admitting: Podiatry

## 2019-03-23 ENCOUNTER — Other Ambulatory Visit: Payer: Self-pay

## 2019-03-23 ENCOUNTER — Encounter: Payer: Self-pay | Admitting: Podiatry

## 2019-03-23 DIAGNOSIS — L97521 Non-pressure chronic ulcer of other part of left foot limited to breakdown of skin: Secondary | ICD-10-CM | POA: Diagnosis not present

## 2019-03-23 DIAGNOSIS — I872 Venous insufficiency (chronic) (peripheral): Secondary | ICD-10-CM

## 2019-03-23 MED ORDER — AMMONIUM LACTATE 12 % EX CREA: TOPICAL_CREAM | CUTANEOUS | 2 refills | Status: DC | PRN

## 2019-03-23 NOTE — Patient Instructions (Signed)
Keep between your toes clean and dry.  Apply the moisturizer to the legs. Keep legs elevated to help with swelling Apply antibiotic ointment to the wound on the left 5th toe daily Monitor for any signs/symptoms of infection. Call the office immediately if any occur or go directly to the emergency room. Call with any questions/concerns.

## 2019-03-26 NOTE — Progress Notes (Signed)
Subjective: 53 year old male presents the office today for follow-up evaluation of wounds to both legs. The unna boots have been helpful. He would like cream to apply to his legs. The wound on the left 5th toe is doing well.  Denies any drainage at present denies any swelling or redness.Denies any fevers, chills, nausea, vomiting.  No calf pain, chest pain, shortness of breath.  Objective: AAO x3, NAD There are still some edema present bilaterally however much improved.  The wounds appear to all healed.  The wound on the left fifth toe is almost healed with a small granular base.  There is no edema, erythema, drainage of pus or any other signs of infection.  Dry skin present bilateral legs.  Macerated interspaces bilaterally.  No skin breakdown, ulcerations. No pain with calf compression, swelling, warmth, erythema  Assessment: Bilateral ulcerations with venous insufficiency; left fifth toe ulceration  Plan: -All treatment options discussed with the patient including all alternatives, risks, complications.  -Overall both legs have healed.  Would not fit test.  Recommended continued antibiotic ointment dressing changes to left toe.  We held off on another Unna boot today but encouraged elevation.  Prescribed amlactin for moisturizer to legs.  Do not apply interdigitally.  My Castellani's paint interdigitally today. -Monitor for any clinical signs or symptoms of infection and directed to call the office immediately should any occur or go to the ER.  Return in about 2 weeks (around 04/06/2019).  Trula Slade DPM

## 2019-04-06 ENCOUNTER — Encounter: Payer: Self-pay | Admitting: Podiatry

## 2019-04-06 ENCOUNTER — Other Ambulatory Visit: Payer: Self-pay

## 2019-04-06 ENCOUNTER — Ambulatory Visit (INDEPENDENT_AMBULATORY_CARE_PROVIDER_SITE_OTHER): Payer: Medicare HMO | Admitting: Podiatry

## 2019-04-06 DIAGNOSIS — I872 Venous insufficiency (chronic) (peripheral): Secondary | ICD-10-CM

## 2019-04-06 DIAGNOSIS — L97909 Non-pressure chronic ulcer of unspecified part of unspecified lower leg with unspecified severity: Secondary | ICD-10-CM

## 2019-04-06 DIAGNOSIS — I83009 Varicose veins of unspecified lower extremity with ulcer of unspecified site: Secondary | ICD-10-CM

## 2019-04-06 DIAGNOSIS — E1142 Type 2 diabetes mellitus with diabetic polyneuropathy: Secondary | ICD-10-CM | POA: Diagnosis not present

## 2019-04-06 MED ORDER — CEPHALEXIN 500 MG PO CAPS: 500.0000 mg | ORAL_CAPSULE | Freq: Three times a day (TID) | ORAL | 0 refills | Status: DC

## 2019-04-09 NOTE — Progress Notes (Signed)
Subjective:  Patient ID: Maurice Nguyen, male    DOB: 1966-04-05,  MRN: [id number]  53 y.o. male presents for followup of left fifth toe blister present for one week.  States that the area has been draining.  Endorses soreness and throbbing with burning.  Endorses swelling.  Also endorses swelling to both legs. Review of Systems: Negative except as noted in the HPI. Denies N/V/F/Ch.  Past Medical History:  Diagnosis Date  . A-fib ([place])   . Anticoagulated, on Xarelto, Chads2Vas score 2 10/02/2013  . Back pain, chronic 07/13/2012  . CHF (congestive heart failure) ([place])   . 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 CKD (chronic kidney disease), stage II   . Complication of anesthesia    " THEY LOST ME AND BROUGHT ME BACK "  . Hypertension 07/13/2012  . Irregular heart beat   . Lumbar herniated disc   . Morbid obesity ([place])   . Obesity, morbid, BMI 50 or higher ([place]) 07/13/2012  . 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 Shortness of breath   . Type II diabetes mellitus ([place]) 07/13/2012    Current Outpatient Medications:  .  ammonium lactate (AMLACTIN) 12 % cream, Apply topically as needed for dry skin., Disp: 385 g, Rfl: 2 .  BD PEN NEEDLE NANO 2ND GEN 32G X 4 MM MISC, USE 1 PEN NEEDLE TID, Disp: , Rfl:  .  carvedilol (COREG) 6.25 MG tablet, Take 1 tablet (6.25 mg total) by mouth 2 (two) times daily., Disp: 60 tablet, Rfl: 11 .  Castellani Paint 1.5 % LIQD, Apply 1 application topically daily., Disp: 1 Bottle, Rfl: 1 .  cephALEXin (KEFLEX) 500 MG capsule, Take 1 capsule (500 mg total) by mouth 3 (three) times daily., Disp: 30 capsule, Rfl: 0 .  doxycycline (VIBRA-TABS) 100 MG tablet, Take 1 tablet (100 mg total) by mouth 2 (two) times daily., Disp: 20 tablet, Rfl: 0 .  gabapentin (NEURONTIN) 300 MG capsule, Take 1 capsule by mouth daily as needed for pain., Disp: , Rfl:  .  glipiZIDE (GLUCOTROL) 10 MG tablet, Take 1 tablet (10 mg total) by mouth 2 (two) times daily  before a meal., Disp: 60 tablet, Rfl: 1 .  HYDROcodone-acetaminophen (NORCO/VICODIN) 5-325 MG tablet, , Disp: , Rfl:  .  Insulin Glargine (BASAGLAR KWIKPEN [address]), Start at 20 units every morning.  Increase by 2 units every other day to get your fasting morning blood glucose down to 120., Disp: , Rfl:  .  JARDIANCE 10 MG TABS tablet, Take 10 mg by mouth daily., Disp: , Rfl:  .  liraglutide (VICTOZA) 18 MG/3ML SOPN, Inject into the skin., Disp: , Rfl:  .  metFORMIN (GLUCOPHAGE) 1000 MG tablet, take 1 tablet by mouth twice a day with meals, Disp: , Rfl:  .  mupirocin ointment (BACTROBAN) 2 %, Apply 1 application topically 2 (two) times daily., Disp: 30 g, Rfl: 2 .  nitroGLYCERIN (NITROSTAT) 0.4 MG SL tablet, Place 1 tablet (0.4 mg total) under the tongue every 5 (five) minutes x 3 doses as needed for chest pain., Disp: 30 tablet, Rfl: 12 .  omeprazole (PRILOSEC) 20 MG capsule, Take 1 capsule (20 mg total) by mouth daily. For 1 month, Disp: 30 capsule, Rfl: 0 .  oxyCODONE-acetaminophen (PERCOCET/ROXICET) 5-325 MG tablet, Take 1 tablet by mouth every 6 (six) hours as needed for moderate pain., Disp: 15 tablet, Rfl: 0 .  OZEMPIC, 1 MG/DOSE, 2 MG/1.5ML SOPN, INJECT 1MG  [place] ONCE WEEKLY, Disp: ,  Rfl:  .  potassium chloride SA (K-DUR,KLOR-CON) 20 MEQ tablet, TAKE 2 TABLETS BY MOUTH EVERY MORNING, Disp: 60 tablet, Rfl: 3 .  pravastatin (PRAVACHOL) 20 MG tablet, Take 1 tablet by mouth daily., Disp: , Rfl:  .  rivaroxaban (XARELTO) 20 MG TABS tablet, Take 20 mg by mouth daily with supper., Disp: , Rfl:  .  SYNJARDY 12.11-998 MG TABS, TK 1 T PO  BID WITH MEALS, Disp: , Rfl:  .  torsemide (DEMADEX) 20 MG tablet, TAKE 2 TABLETS BY MOUTH ONCE DAILY, Disp: 60 tablet, Rfl: 3 .  TRESIBA FLEXTOUCH 200 UNIT/ML SOPN, INJECT 88 UNITS INTO THE SKIN QD, Disp: , Rfl:  .  valsartan (DIOVAN) 80 MG tablet, Take 1 tablet (80 mg total) by mouth daily., Disp: 31 tablet, Rfl: 11  Social History   Tobacco Use  Smoking Status Never  Smoker  Smokeless Tobacco Never Used    Objective:   Constitutional Well developed. Well nourished.  Vascular Edema present to bilateral lower extremities. Foot warm and well perfused.  Dermatologic Maceration and blister present to the fifth toe. No warmth no erythema no signs of acute infection.   Assessment/Plan:  Patient was evaluated and treated and all questions answered.  1.  Blister, left fifth toe. - Betadine applied to the blistered area. Patient to apply betadine daily. Bottle dispensed.  2.  Venous insufficiency bilaterally. - Unna boot applied bilaterally.  Return in about 1 week (around 03/16/2019).

## 2019-04-09 NOTE — Progress Notes (Signed)
Subjective: 53 year old male presents the office today for concerns of wounds to both of his legs the left side "leaking" today.  He states that swelling did return he has noticed wounds although small in both legs and is requesting unna boots today.  Denies any systemic complaints such as fevers, chills, nausea, vomiting. No acute changes since last appointment, and no other complaints at this time.   Objective: AAO x3, NAD DP/PT pulses palpable bilaterally, CRT less than 3 seconds Chronic venous insufficiency is present bilaterally.  On the left side there is an ulceration on the anterior aspect the leg measuring 1 x 1 cm and superficial with a granular wound base with clear fluid but no purulence.  No fluctuance or crepitation.  Also smaller dried lesion on the right leg measuring 0.5 x 0.5 cm.  No fluctuation or crepitation. No pain with calf compression, swelling, warmth, erythema  Assessment: Recurrent ulcerations due to venous insufficiency  Plan: -All treatment options discussed with the patient including all alternatives, risks, complications.  -Bilateral Unna boots were applied.  Precautions were advised on when to remove these.  Encouraged elevation.  Encouraged glucose control. -Patient encouraged to call the office with any questions, concerns, change in symptoms.   Trula Slade DPM

## 2019-04-16 ENCOUNTER — Other Ambulatory Visit: Payer: Self-pay

## 2019-04-16 ENCOUNTER — Ambulatory Visit (INDEPENDENT_AMBULATORY_CARE_PROVIDER_SITE_OTHER): Payer: Medicare HMO | Admitting: Podiatry

## 2019-04-16 DIAGNOSIS — L97909 Non-pressure chronic ulcer of unspecified part of unspecified lower leg with unspecified severity: Secondary | ICD-10-CM

## 2019-04-16 DIAGNOSIS — I83009 Varicose veins of unspecified lower extremity with ulcer of unspecified site: Secondary | ICD-10-CM

## 2019-04-16 DIAGNOSIS — I872 Venous insufficiency (chronic) (peripheral): Secondary | ICD-10-CM

## 2019-04-23 NOTE — Progress Notes (Signed)
Subjective: 53 year old male presents the office today for concerns of wounds to his legs. He states the [place] boot has been helpful and he is requesting another one today. Denies any systemic complaints such as fevers, chills, nausea, vomiting. No acute changes since last appointment, and no other complaints at this time.   Objective: AAO x3, NAD DP/PT pulses palpable bilaterally, CRT less than 3 seconds Chronic venous insufficiency is present bilaterally.  On the left side there is an ulceration on the anterior aspect the leg measuring 0.5 x 0.5 cm and superficial with a granular wound base with clear fluid but no purulence.  No fluctuance or crepitation.  Also smaller dried lesion on the right leg measuring 0.2 x 0.3 cm.  No fluctuation or crepitation. No pain with calf compression, swelling, warmth, erythema  Assessment: Recurrent ulcerations due to venous insufficiency  Plan: -All treatment options discussed with the patient including all alternatives, risks, complications.  -Bilateral Unna boots were applied.  Precautions were advised on when to remove these.  Encouraged elevation.  Encouraged glucose control. -Patient encouraged to call the office with any questions, concerns, change in symptoms.   Trula Slade DPM

## 2019-04-24 ENCOUNTER — Ambulatory Visit: Payer: Medicare HMO | Admitting: Podiatry

## 2019-04-26 ENCOUNTER — Other Ambulatory Visit: Payer: Self-pay

## 2019-04-26 ENCOUNTER — Ambulatory Visit (INDEPENDENT_AMBULATORY_CARE_PROVIDER_SITE_OTHER): Payer: Medicare HMO | Admitting: Podiatry

## 2019-04-26 ENCOUNTER — Encounter: Payer: Self-pay | Admitting: Podiatry

## 2019-04-26 DIAGNOSIS — M79674 Pain in right toe(s): Secondary | ICD-10-CM | POA: Diagnosis not present

## 2019-04-26 DIAGNOSIS — L97909 Non-pressure chronic ulcer of unspecified part of unspecified lower leg with unspecified severity: Secondary | ICD-10-CM

## 2019-04-26 DIAGNOSIS — M79675 Pain in left toe(s): Secondary | ICD-10-CM | POA: Diagnosis not present

## 2019-04-26 DIAGNOSIS — E1142 Type 2 diabetes mellitus with diabetic polyneuropathy: Secondary | ICD-10-CM | POA: Diagnosis not present

## 2019-04-26 DIAGNOSIS — B351 Tinea unguium: Secondary | ICD-10-CM

## 2019-04-26 DIAGNOSIS — I872 Venous insufficiency (chronic) (peripheral): Secondary | ICD-10-CM | POA: Diagnosis not present

## 2019-04-26 DIAGNOSIS — I83009 Varicose veins of unspecified lower extremity with ulcer of unspecified site: Secondary | ICD-10-CM

## 2019-04-26 DIAGNOSIS — L84 Corns and callosities: Secondary | ICD-10-CM | POA: Diagnosis not present

## 2019-04-26 MED ORDER — AMMONIUM LACTATE 12 % EX CREA: TOPICAL_CREAM | CUTANEOUS | 0 refills | Status: DC | PRN

## 2019-04-30 ENCOUNTER — Telehealth: Payer: Self-pay | Admitting: *Deleted

## 2019-04-30 NOTE — Telephone Encounter (Signed)
Called and left a message for the patient to call me back about the company where the patient gets the compression socks and left the [place] office number [phone]. Lattie Haw

## 2019-04-30 NOTE — Progress Notes (Signed)
Subjective: 53 year old male presents the office today for concerns of wounds to his legs. He states the [place] boot has been helpful and he is requesting another one today.  He is also asking for his nails be trimmed today's are thickened elongated he cannot trim them himself.  Also asking for moisturizer to his legs once the introducer removed.  Denies any systemic complaints such as fevers, chills, nausea, vomiting. No acute changes since last appointment, and no other complaints at this time.   Objective: AAO x3, NAD DP/PT pulses palpable bilaterally, CRT less than 3 seconds Chronic venous insufficiency is present bilaterally.  On evaluation today appears that the wounds to heal.  Still some swelling but there is no open sores or any drainage. Nails are hypertrophic, dystrophic, brittle, discolored, elongated 10. No surrounding redness or drainage. Tenderness nails 1-5 bilaterally.  Hyperkeratotic lesion left fifth toe over the area the previous nail removal.  Upon debridement underlying wound is healed.  No edema, erythema or any signs of infection. No pain with calf compression, swelling, warmth, erythema  Assessment: Recurrent ulcerations due to venous insufficiency; symptomatic onychomycosis  Plan: -All treatment options discussed with the patient including all alternatives, risks, complications.  -Bilateral Unna boots were applied.  Precautions were advised on when to remove these.  Encouraged elevation.  He previously had compression socks but he is not able to get them.  He states he went him to the wound care center And at no cost and they came out of the house to fit him.  I have worked on this on several occasions for him but not been successful.  I will continue to try.   -Nails debrided x10 without any complications or bleeding.   -Hyperkeratotic lesion sharply debrided x1 without any complications or bleeding. -Encouraged glucose control. -Start AmLactin for skin. -Patient  encouraged to call the office with any questions, concerns, change in symptoms.   Trula Slade DPM

## 2019-05-14 ENCOUNTER — Other Ambulatory Visit: Payer: Self-pay

## 2019-05-14 ENCOUNTER — Encounter: Payer: Self-pay | Admitting: Podiatry

## 2019-05-14 ENCOUNTER — Ambulatory Visit (INDEPENDENT_AMBULATORY_CARE_PROVIDER_SITE_OTHER): Payer: Medicare HMO | Admitting: Podiatry

## 2019-05-14 DIAGNOSIS — I872 Venous insufficiency (chronic) (peripheral): Secondary | ICD-10-CM

## 2019-05-15 NOTE — Progress Notes (Signed)
Subjective: 53 year old male presents the office today for follow evaluation of swelling to both of his legs.  His main concern today there is dry skin present.  Denies any open sores.  He states his legs feel much better.  Denies any systemic complaints such as fevers, chills, nausea, vomiting. No acute changes since last appointment, and no other complaints at this time.   Objective: AAO x3, NAD DP/PT pulses palpable bilaterally, CRT less than 3 seconds Chronic venous insufficiency is present bilaterally.  On evaluation today appears that the wounds to heal.  Swelling is much improved.  There is no pain to the legs.  Dry skin is present.  Wound on the left fifth toe is healed. No pain with calf compression, swelling, warmth, erythema  Assessment: Venous insufficiency  Plan: -All treatment options discussed with the patient including all alternatives, risks, complications.  -Encouraged elevation.  We will try for compression socks again.  Today we did clean his legs with Hibiclens and moisturizer was applied.  Also dispensed moisturizer for him.  Directed on use.  Return in about 2 weeks (around 05/28/2019).  Trula Slade DPM

## 2019-05-16 ENCOUNTER — Telehealth: Payer: Self-pay | Admitting: *Deleted

## 2019-05-16 NOTE — Telephone Encounter (Signed)
-----   Message from Trula Slade, DPM sent at 05/15/2019  8:48 AM EDT ----- Can you please order compression stockings for him? We had tried before and he wants them for "free". I told him I don't know what his insurance covers but we would try. Thanks.

## 2019-05-16 NOTE — Telephone Encounter (Signed)
[facility] - Lizzie states [employer] direct line to [employer] [phone].

## 2019-05-16 NOTE — Telephone Encounter (Signed)
I called Maurice Nguyen - [employer], she states she will email a program statement letter, participant forms will need measurements and prescription, and pt can call every 6 months for a new pair.

## 2019-05-28 ENCOUNTER — Ambulatory Visit (INDEPENDENT_AMBULATORY_CARE_PROVIDER_SITE_OTHER): Payer: Medicare HMO | Admitting: Podiatry

## 2019-05-28 ENCOUNTER — Other Ambulatory Visit: Payer: Self-pay

## 2019-05-28 DIAGNOSIS — I872 Venous insufficiency (chronic) (peripheral): Secondary | ICD-10-CM | POA: Diagnosis not present

## 2019-05-28 DIAGNOSIS — I83009 Varicose veins of unspecified lower extremity with ulcer of unspecified site: Secondary | ICD-10-CM | POA: Diagnosis not present

## 2019-05-28 DIAGNOSIS — L97909 Non-pressure chronic ulcer of unspecified part of unspecified lower leg with unspecified severity: Secondary | ICD-10-CM

## 2019-05-28 NOTE — Progress Notes (Signed)
Subjective: 53 year old male presents the office today for further evaluation of swelling to both of his legs and to be measured for compression socks.  He also states that he try to wear his old compression socks and they were too tight and caused a cut to the front of the ankle.  He has been keeping antibiotic ointment on the area daily.  Denies any drainage or pus.Denies any systemic complaints such as fevers, chills, nausea, vomiting. No acute changes since last appointment, and no other complaints at this time.   Objective: AAO x3, NAD DP/PT pulses palpable bilaterally, CRT less than 3 seconds Chronic swelling present bilaterally.  On the anterior aspect of the right ankle is an ulceration which is superficial measuring less than 1.2 x 0.3 cm with a depth of 0.3 cm.  There is no probing to bone or tendon.  No surrounding erythema, ascending cellulitis.  No fluctuation crepitation.  There is no malodor. No pain with calf compression, swelling, warmth, erythema  Assessment: Chronic venous insufficiency with right anterior ankle ulceration  Plan: -All treatment options discussed with the patient including all alternatives, risks, complications.  -Bilateral Unna boots were applied today.  Discussed with him precautions and when to remove the Unna boots.  Encouraged elevation.  He was measured for compression socks today. -Patient encouraged to call the office with any questions, concerns, change in symptoms.   Return in about 1 week (around 06/04/2019).  Trula Slade DPM

## 2019-05-31 ENCOUNTER — Telehealth: Payer: Self-pay | Admitting: Podiatry

## 2019-05-31 NOTE — Telephone Encounter (Signed)
Left message [facility] - Lavone Nian requesting current fax to [employer].

## 2019-05-31 NOTE — Telephone Encounter (Signed)
I informed pt that Dr. [name] had completed his portion of the prescription and the required forms and were being faxed out now. Pt states he was told that the other day. I told pt that I did not know why someone would tell him that, but once the form and information was reviewed by the company he would be contacted by them.

## 2019-05-31 NOTE — Telephone Encounter (Signed)
Patient wants to know about his compression socks were they ordered or do they need to be ordered

## 2019-05-31 NOTE — Telephone Encounter (Signed)
Unable to fax to the [employer] [phone], recording was to a phone line.

## 2019-05-31 NOTE — Telephone Encounter (Signed)
[facility] - Lavone Nian states the new fax [phone]. Faxed required form, rx for 20-32mmHg hose knee-high 1 pair every 6 months.

## 2019-05-31 NOTE — Telephone Encounter (Signed)
Faxed required forms to [employer].

## 2019-06-01 NOTE — Telephone Encounter (Signed)
Sigaris - Maurice Nguyen states she is sending pt's compression wraps today. Left message informing pt the hose were on the way and he could order a pair every 6 months, that information would be in the order.

## 2019-06-04 ENCOUNTER — Ambulatory Visit (INDEPENDENT_AMBULATORY_CARE_PROVIDER_SITE_OTHER): Payer: Medicare HMO | Admitting: Podiatry

## 2019-06-04 ENCOUNTER — Encounter: Payer: Self-pay | Admitting: Podiatry

## 2019-06-04 ENCOUNTER — Other Ambulatory Visit: Payer: Self-pay

## 2019-06-04 DIAGNOSIS — L97909 Non-pressure chronic ulcer of unspecified part of unspecified lower leg with unspecified severity: Secondary | ICD-10-CM | POA: Diagnosis not present

## 2019-06-04 DIAGNOSIS — I83009 Varicose veins of unspecified lower extremity with ulcer of unspecified site: Secondary | ICD-10-CM | POA: Diagnosis not present

## 2019-06-04 DIAGNOSIS — E1142 Type 2 diabetes mellitus with diabetic polyneuropathy: Secondary | ICD-10-CM

## 2019-06-04 DIAGNOSIS — I872 Venous insufficiency (chronic) (peripheral): Secondary | ICD-10-CM | POA: Diagnosis not present

## 2019-06-05 NOTE — Progress Notes (Signed)
Subjective: 53 year old male presents the office today for further evaluation of swelling to both of his legs and for wounds on the right side.  He said the wound is doing better intact in the boot at this point.  He has not seen any drainage or pus he has no other concerns today.  Denies any fevers, chills, nausea, vomiting.  Objective: AAO x3, NAD DP/PT pulses palpable bilaterally, CRT less than 3 seconds Chronic swelling present bilaterally.  On the anterior aspect of the right ankle is an ulceration which is superficial measuring less than 1 x 0.3 cm with a depth of 0.2 cm.  On the anterior proximal aspect left there is a small skin tear present.  There is no probing to bone or tendon to either of the wounds.  No surrounding erythema, ascending cellulitis.  No fluctuation crepitation.  There is no malodor. No pain with calf compression, swelling, warmth, erythema  Assessment: Chronic venous insufficiency with right anterior ankle ulceration  Plan: -All treatment options discussed with the patient including all alternatives, risks, complications.  -Awaiting Endoloops.  Antibiotic ointment dressing was applied to both of the areas on the right side-Ace bandage was used for compression bilaterally.  Encouraged elevation. -Monitor for any clinical signs or symptoms of infection and directed to call the office immediately should any occur or go to the ER.  No follow-ups on file.  Trula Slade DPM

## 2019-06-11 ENCOUNTER — Ambulatory Visit (INDEPENDENT_AMBULATORY_CARE_PROVIDER_SITE_OTHER): Payer: Medicare HMO | Admitting: Podiatry

## 2019-06-11 ENCOUNTER — Other Ambulatory Visit: Payer: Self-pay

## 2019-06-11 DIAGNOSIS — B351 Tinea unguium: Secondary | ICD-10-CM

## 2019-06-11 DIAGNOSIS — M79674 Pain in right toe(s): Secondary | ICD-10-CM | POA: Diagnosis not present

## 2019-06-11 DIAGNOSIS — L97909 Non-pressure chronic ulcer of unspecified part of unspecified lower leg with unspecified severity: Secondary | ICD-10-CM

## 2019-06-11 DIAGNOSIS — I872 Venous insufficiency (chronic) (peripheral): Secondary | ICD-10-CM

## 2019-06-11 DIAGNOSIS — E1142 Type 2 diabetes mellitus with diabetic polyneuropathy: Secondary | ICD-10-CM

## 2019-06-11 DIAGNOSIS — M79675 Pain in left toe(s): Secondary | ICD-10-CM

## 2019-06-11 DIAGNOSIS — I83009 Varicose veins of unspecified lower extremity with ulcer of unspecified site: Secondary | ICD-10-CM | POA: Diagnosis not present

## 2019-06-13 NOTE — Progress Notes (Signed)
Subjective: 53 year old male presents the office today for further evaluation of swelling to both of his legs and for wounds on the right side.  He did receive the compression socks but he states they are different than what he had previously.  He has not yet tried them.  Denies any fevers, chills, nausea, vomiting.  No chest pain, shortness of breath.  Objective: AAO x3, NAD DP/PT pulses palpable bilaterally, CRT less than 3 seconds Chronic swelling present bilaterally.  On the anterior aspect of the right ankle is an ulceration which is superficial measuring less than 0.8 x 0.2 cm with a depth of 0.2 cm.  On the anterior proximal aspect left there is a small skin tear present however this appears to be healing.  There is also a small area on the anterior leg with some skin breakdown and clear fluid but there is no purulence there is no erythema or warmth of the leg.  There is no probing to bone or tendon to either of the wounds.  No surrounding erythema, ascending cellulitis.  No fluctuation crepitation.  There is no malodor. Nails are hypertrophic, dystrophic, brittle, discolored, elongated 10. No surrounding redness or drainage. Tenderness nails 1-5 bilaterally. No open lesions or pre-ulcerative lesions are identified today. No pain with calf compression, swelling, warmth, erythema  Assessment: Chronic venous insufficiency with right anterior ankle ulceration; symptomatic onychomycosis  Plan: -All treatment options discussed with the patient including all alternatives, risks, complications.  -Unna boot was applied to the right side today given the wounds as well as swelling and wound drainage.  He did receive the compression socks.  These are the wraps.  Discussed how to apply these but he did not bring them with him.  He still uses on the left leg.  Encouraged elevation. -Nails debrided x10 without any complications or bleeding -Monitor for any clinical signs or symptoms of infection and  directed to call the office immediately should any occur or go to the ER.  Return in about 1 week (around 06/18/2019).  Trula Slade DPM

## 2019-06-18 ENCOUNTER — Ambulatory Visit (INDEPENDENT_AMBULATORY_CARE_PROVIDER_SITE_OTHER): Payer: Medicare HMO | Admitting: Podiatry

## 2019-06-18 ENCOUNTER — Other Ambulatory Visit: Payer: Self-pay

## 2019-06-18 DIAGNOSIS — I83009 Varicose veins of unspecified lower extremity with ulcer of unspecified site: Secondary | ICD-10-CM | POA: Diagnosis not present

## 2019-06-18 DIAGNOSIS — I872 Venous insufficiency (chronic) (peripheral): Secondary | ICD-10-CM | POA: Diagnosis not present

## 2019-06-18 DIAGNOSIS — E1142 Type 2 diabetes mellitus with diabetic polyneuropathy: Secondary | ICD-10-CM | POA: Diagnosis not present

## 2019-06-18 DIAGNOSIS — L97909 Non-pressure chronic ulcer of unspecified part of unspecified lower leg with unspecified severity: Secondary | ICD-10-CM

## 2019-06-18 NOTE — Progress Notes (Signed)
Subjective: 53 year old male presents the office today for further evaluation of swelling to both of his legs and for wounds on the right side.  He states the new compression socks of been doing well however he put them on after I last saw him he does remove them today most them on the entire time.  Denies any new sores.  The wound on the right side been doing better.  Denies any fevers, chills, nausea, vomiting.  No chest pain, shortness of breath.  Objective: AAO x3, NAD DP/PT pulses palpable bilaterally, CRT less than 3 seconds Chronic swelling present bilaterally.  On the anterior aspect of the right ankle is an ulceration which is superficial measuring less than 0.4 x 0.2 cm with a depth of 0.1 cm.  On the anterior proximal aspect left there is a small skin tear however this is healed.  There is no surrounding edema, erythema, drainage or possibly signs of infection.  Swelling on the left side is much improved.  However there is a new area of preulceration on the plantar lateral midfoot.  This is from where some skin came off after he removed the compression wraps today. No pain with calf compression, swelling, warmth, erythema  Assessment: Chronic venous insufficiency with right anterior ankle ulceration; preulcerative area left foot  Plan: -All treatment options discussed with the patient including all alternatives, risks, complications.  -For now would continue with compression socks.  He needs to take them off at nighttime.  Discussed how to wear the compression socks.  Monitor for any further skin breakdown or signs of infection. -Monitor for any clinical signs or symptoms of infection and directed to call the office immediately should any occur or go to the ER. Trula Slade DPM

## 2019-07-02 ENCOUNTER — Ambulatory Visit (INDEPENDENT_AMBULATORY_CARE_PROVIDER_SITE_OTHER): Payer: Medicare HMO | Admitting: Podiatry

## 2019-07-02 ENCOUNTER — Encounter: Payer: Self-pay | Admitting: Podiatry

## 2019-07-02 ENCOUNTER — Other Ambulatory Visit: Payer: Self-pay

## 2019-07-02 DIAGNOSIS — B351 Tinea unguium: Secondary | ICD-10-CM | POA: Diagnosis not present

## 2019-07-02 DIAGNOSIS — I872 Venous insufficiency (chronic) (peripheral): Secondary | ICD-10-CM

## 2019-07-02 DIAGNOSIS — M79675 Pain in left toe(s): Secondary | ICD-10-CM | POA: Diagnosis not present

## 2019-07-02 DIAGNOSIS — E1142 Type 2 diabetes mellitus with diabetic polyneuropathy: Secondary | ICD-10-CM

## 2019-07-02 DIAGNOSIS — M79674 Pain in right toe(s): Secondary | ICD-10-CM

## 2019-07-09 NOTE — Progress Notes (Signed)
Subjective: 53 year old male presents the office today for further evaluation of swelling to both of his legs and for wounds on the right side.  He states that overall he is doing better and the compression socks have been very helpful.  Denies any open sores.  Swelling has been controlled.  Denies any drainage.  Has no other concerns. Denies any fevers, chills, nausea, vomiting.  No chest pain, shortness of breath.  Objective: AAO x3, NAD DP/PT pulses palpable bilaterally, CRT less than 3 seconds Nails are hypertrophic, dystrophic, brittle, discolored, elongated 10. No surrounding redness or drainage. Tenderness nails 1-5 bilaterally. No open lesions or pre-ulcerative lesions are identified today. The wound on the right ankle is healed.  There is no new ulcerations identified.  The area on the left foot has resolved. No pain with calf compression, swelling, warmth, erythema  Assessment: Chronic venous insufficiency with right anterior ankle ulceration; symptomatic onychomycosis  Plan: -All treatment options discussed with the patient including all alternatives, risks, complications.  -Nails debrided x10 without any complications or bleeding. -Continue with compression socks, elevation.  Monitor any skin breakdown or irritation. -Monitor for any clinical signs or symptoms of infection and directed to call the office immediately should any occur or go to the ER. Trula Slade DPM   Trula Slade DPM

## 2019-09-03 ENCOUNTER — Encounter: Payer: Self-pay | Admitting: Podiatry

## 2019-09-03 ENCOUNTER — Ambulatory Visit (INDEPENDENT_AMBULATORY_CARE_PROVIDER_SITE_OTHER): Payer: Medicare HMO | Admitting: Podiatry

## 2019-09-03 ENCOUNTER — Other Ambulatory Visit: Payer: Self-pay

## 2019-09-03 ENCOUNTER — Ambulatory Visit (INDEPENDENT_AMBULATORY_CARE_PROVIDER_SITE_OTHER): Payer: Medicare HMO

## 2019-09-03 DIAGNOSIS — L97529 Non-pressure chronic ulcer of other part of left foot with unspecified severity: Secondary | ICD-10-CM

## 2019-09-03 DIAGNOSIS — L97319 Non-pressure chronic ulcer of right ankle with unspecified severity: Secondary | ICD-10-CM

## 2019-09-03 DIAGNOSIS — L97522 Non-pressure chronic ulcer of other part of left foot with fat layer exposed: Secondary | ICD-10-CM

## 2019-09-03 DIAGNOSIS — I872 Venous insufficiency (chronic) (peripheral): Secondary | ICD-10-CM

## 2019-09-03 DIAGNOSIS — L97524 Non-pressure chronic ulcer of other part of left foot with necrosis of bone: Secondary | ICD-10-CM | POA: Diagnosis not present

## 2019-09-03 DIAGNOSIS — L97321 Non-pressure chronic ulcer of left ankle limited to breakdown of skin: Secondary | ICD-10-CM | POA: Diagnosis not present

## 2019-09-03 DIAGNOSIS — L97511 Non-pressure chronic ulcer of other part of right foot limited to breakdown of skin: Secondary | ICD-10-CM | POA: Diagnosis not present

## 2019-09-03 DIAGNOSIS — M79674 Pain in right toe(s): Secondary | ICD-10-CM

## 2019-09-03 DIAGNOSIS — B351 Tinea unguium: Secondary | ICD-10-CM

## 2019-09-03 DIAGNOSIS — M79675 Pain in left toe(s): Secondary | ICD-10-CM

## 2019-09-03 MED ORDER — DOXYCYCLINE HYCLATE 100 MG PO TABS: 100.0000 mg | ORAL_TABLET | Freq: Two times a day (BID) | ORAL | 0 refills | Status: DC

## 2019-09-03 MED ORDER — MUPIROCIN 2 % EX OINT: 1.0000 "application " | TOPICAL_OINTMENT | Freq: Two times a day (BID) | CUTANEOUS | 2 refills | Status: DC

## 2019-09-10 NOTE — Progress Notes (Signed)
Subjective: 54 year old male presents the office today for further evaluation of swelling to both of his legs and for nail trim.  Otherwise he states he has been doing well but he has noticed a tender spot in the bottom of his left foot.  Denies any recent injury or stepping on any foreign object.  Denies any fevers, chills, nausea, vomiting.  No calf pain, chest pain, shortness of breath.  Objective: AAO x3, NAD DP/PT pulses palpable bilaterally, CRT less than 3 seconds Nails are hypertrophic, dystrophic, brittle, discolored, elongated 10. No surrounding redness or drainage. Tenderness nails 1-5 bilaterally.  Open lesion on the anterior aspect of the right ankle with a granular wound base.  There is no probing, undermining or tunneling and there is no exposed tendon.  Measures approximately 1 x 0.56 cm.  Also there is an ulceration present on the plantar aspect the left lateral midfoot with tenderness palpation.  This measures approximate 0.5 x 0.5 cm and again there is no probing, undermining or tunneling. The wound on the right ankle is healed.  There is no new ulcerations identified.  The area on the left foot has resolved. No pain with calf compression, swelling, warmth, erythema  Assessment: Chronic venous insufficiency with right anterior ankle ulceration; symptomatic onychomycosis; bilateral ulcerations  Plan: -All treatment options discussed with the patient including all alternatives, risks, complications.  -Extremities obtained and reviewed of the left foot.  No evidence of acute osteomyelitis, foreign body or fracture. -Nails debrided x10 without any complications or bleeding. The-the wounds x2 there are any complications or bleeding.  Prescribed doxycycline.  Mupirocin ointment dressing changes daily.  Offloading. -Continue with compression socks, elevation.  Monitor any skin breakdown or irritation. -Monitor for any clinical signs or symptoms of infection and directed to call the  office immediately should any occur or go to the ER.  RTC 1 week   [name]

## 2019-09-13 ENCOUNTER — Ambulatory Visit: Payer: Medicare HMO | Admitting: Podiatry

## 2019-09-19 ENCOUNTER — Ambulatory Visit (INDEPENDENT_AMBULATORY_CARE_PROVIDER_SITE_OTHER): Payer: Medicare HMO | Admitting: Podiatry

## 2019-09-19 ENCOUNTER — Other Ambulatory Visit: Payer: Self-pay

## 2019-09-19 DIAGNOSIS — L97529 Non-pressure chronic ulcer of other part of left foot with unspecified severity: Secondary | ICD-10-CM

## 2019-09-19 DIAGNOSIS — L97311 Non-pressure chronic ulcer of right ankle limited to breakdown of skin: Secondary | ICD-10-CM

## 2019-09-19 MED ORDER — DOXYCYCLINE HYCLATE 100 MG PO TABS: 100.0000 mg | ORAL_TABLET | Freq: Two times a day (BID) | ORAL | 0 refills | Status: DC

## 2019-09-20 ENCOUNTER — Telehealth: Payer: Self-pay | Admitting: *Deleted

## 2019-09-20 DIAGNOSIS — M79674 Pain in right toe(s): Secondary | ICD-10-CM

## 2019-09-20 DIAGNOSIS — L03032 Cellulitis of left toe: Secondary | ICD-10-CM

## 2019-09-20 DIAGNOSIS — L97529 Non-pressure chronic ulcer of other part of left foot with unspecified severity: Secondary | ICD-10-CM

## 2019-09-20 DIAGNOSIS — E1142 Type 2 diabetes mellitus with diabetic polyneuropathy: Secondary | ICD-10-CM

## 2019-09-20 DIAGNOSIS — L02612 Cutaneous abscess of left foot: Secondary | ICD-10-CM

## 2019-09-20 DIAGNOSIS — M79675 Pain in left toe(s): Secondary | ICD-10-CM

## 2019-09-20 DIAGNOSIS — M869 Osteomyelitis, unspecified: Secondary | ICD-10-CM

## 2019-09-20 NOTE — Telephone Encounter (Signed)
THIS [place] MEDICAID MEMBER DOES NOT REQUIRE PRIOR AUTHORIZATION FOR OUT PATIENT RADIOLOGY THROUGH [facility] OR [facility] AT THIS TIME.  Faxed orders for MRI to [facility]. Faxed orders for arterial and ABI with TBI dopplers to [facility].

## 2019-09-20 NOTE — Telephone Encounter (Signed)
-----   Message from Vivi Barrack, DPM sent at 09/19/2019  4:07 PM EST ----- Can you please order an MRI of the left foot to evaluate ostoemyelitis due to ulcer and also new arterial studies? Thanks.

## 2019-09-23 NOTE — Progress Notes (Signed)
Subjective: 54 year old male presents the office today for follow-up evaluation of a wound on the left foot.  Still having tenderness to the area.  He has noticed a black spot in the middle of the wound but his wife thinks this is from his sock.  He had some clear, bloody drainage but there is no pus.  No increase in swelling to the foot no redness or warmth. Denies any systemic complaints such as fevers, chills, nausea, vomiting. No acute changes since last appointment, and no other complaints at this time.   Objective: AAO x3, NAD DP/PT pulses palpable bilaterally, CRT less than 3 seconds On the plantar lateral aspect of the left foot on the fifth metatarsal base and ulceration measuring the same size of 0.5 x 0.5 cm however it has a depth today of 0.3 cm.  There is no probing to bone, undermining or tunneling.  There with black discoloration present in the wound but this was more from his sock insert.  After debridement there is a granular wound base.  There is no fluctuation or crepitation.  There is no malodor.  Tenderness palpation of this area. Anterior wound on the right ankle hyperkeratotic tissue.  Upon debridement there is measuring approximate 0.6 x 0.5 cm and is superficial with a granular wound base.  No probing to bone, exposed tendon. No pain with calf compression, swelling, warmth, erythema  Assessment: Ulcerations bilaterally  Plan: -All treatment options discussed with the patient including all alternatives, risks, complications.  -Debrided the wounds today x2 without any complications or bleeding utilize #312 with scalpel.  Previous x-rays were negative for osteomyelitis.  I would order an MRI of the left foot to evaluate osteomyelitis.  Also blood work ordered including ESR, CRP, CBC, CMP.  Continue doxycycline for now. -Arterial studies ordered.  -Continue antibiotic ointment dressing changes daily. -Offloading -Patient encouraged to call the office with any questions,  concerns, change in symptoms.   Return in about 1 week (around 09/26/2019).  Trula Slade DPM

## 2019-09-25 ENCOUNTER — Telehealth: Payer: Self-pay | Admitting: *Deleted

## 2019-09-25 NOTE — Telephone Encounter (Signed)
Pt called for the location of his cardiovascular testing. I informed pt the [facility] was at [facility] in the parking deck with [employer].

## 2019-09-26 ENCOUNTER — Other Ambulatory Visit: Payer: Self-pay

## 2019-09-26 ENCOUNTER — Telehealth: Payer: Self-pay | Admitting: Cardiovascular Disease

## 2019-09-26 ENCOUNTER — Telehealth: Payer: Self-pay | Admitting: *Deleted

## 2019-09-26 ENCOUNTER — Ambulatory Visit (HOSPITAL_COMMUNITY)
Admission: RE | Admit: 2019-09-26 | Discharge: 2019-09-26 | Disposition: A | Discharge status: Home / Self Care | Payer: Medicare HMO | Source: Ambulatory Visit | Attending: Internal Medicine | Admitting: Internal Medicine

## 2019-09-26 DIAGNOSIS — E1142 Type 2 diabetes mellitus with diabetic polyneuropathy: Secondary | ICD-10-CM | POA: Diagnosis present

## 2019-09-26 DIAGNOSIS — L97529 Non-pressure chronic ulcer of other part of left foot with unspecified severity: Secondary | ICD-10-CM

## 2019-09-26 DIAGNOSIS — M79674 Pain in right toe(s): Secondary | ICD-10-CM

## 2019-09-26 DIAGNOSIS — L02612 Cutaneous abscess of left foot: Secondary | ICD-10-CM | POA: Insufficient documentation

## 2019-09-26 DIAGNOSIS — M869 Osteomyelitis, unspecified: Secondary | ICD-10-CM

## 2019-09-26 DIAGNOSIS — M79675 Pain in left toe(s): Secondary | ICD-10-CM | POA: Insufficient documentation

## 2019-09-26 DIAGNOSIS — L03032 Cellulitis of left toe: Secondary | ICD-10-CM | POA: Diagnosis present

## 2019-09-26 DIAGNOSIS — L97311 Non-pressure chronic ulcer of right ankle limited to breakdown of skin: Secondary | ICD-10-CM

## 2019-09-26 NOTE — Telephone Encounter (Deleted)
-----   Message from Vivi Barrack, DPM sent at 09/26/2019  4:27 PM EST ----- Can you please put in for a [facility] consult due to the abnormal arterial studies and new ulceration? Thanks.

## 2019-09-26 NOTE — Telephone Encounter (Signed)
Maurice Nguyen with [facility] states that she is requesting an order for the patient to have lipid lab work done per PA, Ralene Ok.  Please return call to discuss at [phone].   Fax #: [phone]

## 2019-09-26 NOTE — Telephone Encounter (Signed)
LMTCB on voicemail line for [facility] returning [place] call.

## 2019-09-27 ENCOUNTER — Encounter: Payer: Self-pay | Admitting: Podiatry

## 2019-09-27 ENCOUNTER — Ambulatory Visit (INDEPENDENT_AMBULATORY_CARE_PROVIDER_SITE_OTHER): Payer: Medicare HMO | Admitting: Podiatry

## 2019-09-27 VITALS — Temp 98.0°F

## 2019-09-27 DIAGNOSIS — L97529 Non-pressure chronic ulcer of other part of left foot with unspecified severity: Secondary | ICD-10-CM | POA: Diagnosis not present

## 2019-09-27 DIAGNOSIS — I739 Peripheral vascular disease, unspecified: Secondary | ICD-10-CM

## 2019-09-27 MED ORDER — SANTYL 250 UNIT/GM EX OINT: 1.0000 "application " | TOPICAL_OINTMENT | Freq: Every day | CUTANEOUS | 0 refills | Status: DC

## 2019-09-27 NOTE — Telephone Encounter (Signed)
-----   Message from Matthew R Wagoner, DPM sent at 09/26/2019  4:27 PM EST ----- Can you please put in for a [facility] consult due to the abnormal arterial studies and new ulceration? Thanks.  

## 2019-09-27 NOTE — Telephone Encounter (Signed)
Left message for pt to call for results, that I had a urgent message concerning his circulation results.

## 2019-09-27 NOTE — Telephone Encounter (Signed)
Left message requesting pt call for results. 

## 2019-09-28 NOTE — Telephone Encounter (Signed)
Faxed required form, demographics and 09/27/2019 to [facility].

## 2019-09-28 NOTE — Progress Notes (Signed)
Subjective: 54 year old male presents the office today for follow-up evaluation of a wound on the left foot.  He states that the wound is still tender.  He has some bloody drainage from from the wound but denies any pus.  He states the wound started to turn dark again.  It had a dark spot last appointment however I was able to debride this and appeared to be icteric.  He started becoming dark again.  He did have his vascular studies performed yesterday.  He did not get his blood work done.  He has not heard about the MRI.  No increase in swelling to the foot no redness or warmth.  He denies any systemic concerns including fevers, chills, nausea, vomiting.  No calf pain, chest pain, shortness of breath.    Objective: AAO x3, NAD DP/PT pulses palpable bilaterally, CRT less than 3 seconds On the plantar lateral aspect of the left foot on the fifth metatarsal base there is an ulceration larger today measuring 1.5 x 1.5 x 0.3 cm.  There is no probing to bone, undermining or tunneling.  There is tenderness to palpation of the wound and mild swelling around the wound.  Mild hyperpigmentation around the wound as well.  There is no fluctuation crepitation.  There is no ascending cellulitis.  No malodor. Wound on the anterior aspect of the right ankle is healing. Chronic swelling present bilaterally with lymphedema. No pain with calf compression, swelling, warmth, erythema  Assessment: Ulcerations bilaterally; worsening left foot ulceration  Plan: -All treatment options discussed with the patient including all alternatives, risks, complications.  -Lightly debrided the wound today with any complications or bleeding utilizing the 312 with scalpel. -Vascular studies were performed.  We will place a referral for vascular surgery consultation. -Given the information to call about the MRI. -Reprinted blood work requisition for him. -Dispensed surgical shoe with a offloading pad, [employer].  -Continue daily  dressing changes.  Ordered Santyl to apply to the wound.  Finish course of antibiotics. -Monitor for any clinical signs or symptoms of infection and directed to call the office immediately should any occur or go to the ER.  Return in about 1 week (around 10/04/2019).  [name]

## 2019-09-28 NOTE — Telephone Encounter (Signed)
Note is complete. I went over the results with him yesterday. Also he did not get his blood work and reprinted the paperwork for him and also gave him the number for [facility] to call about scheduling the MRI. Thanks.

## 2019-09-28 NOTE — Telephone Encounter (Signed)
Mailed letter to pt explaining the results reviewed by Dr. Ardelle Anton and that he had been referred to [facility] for evaluation and treatment options to improve his circulation for healing. Faxed required form, available clinicals and demographics to [facility].

## 2019-10-04 ENCOUNTER — Telehealth: Payer: Self-pay | Admitting: *Deleted

## 2019-10-04 NOTE — Telephone Encounter (Signed)
[facility] Jasmine states pt is scheduled for MRI tomorrow and MRI needs pre-cert.

## 2019-10-04 NOTE — Telephone Encounter (Signed)
[facility] T[facility] states pt's red, white and blue medicare expired, he has medicaid and [employer] HMO.

## 2019-10-04 NOTE — Telephone Encounter (Signed)
Left message informing [facility] Leavy Cella that pt has the new Medicare and Medicaid. Medicaid did not need prior authorization when checked 09/20/2019.

## 2019-10-05 ENCOUNTER — Other Ambulatory Visit: Payer: Medicare HMO

## 2019-10-05 ENCOUNTER — Telehealth: Payer: Self-pay | Admitting: *Deleted

## 2019-10-05 NOTE — Telephone Encounter (Signed)
Called and left a message for the patient to call me back-the patient does not have an updated [employer] card in the computer and I need the phone number off the back of the card in order to get an MRI done. Misty Stanley

## 2019-10-05 NOTE — Telephone Encounter (Signed)
Called and spoke with Louanna from [facility] and the representative stated that the procedure code 16109 needed prior authorization and the valid dates are from 10-05-2019 thru 11-04-2019 and the authorization number is [id number]. Misty Stanley

## 2019-10-08 ENCOUNTER — Emergency Department (HOSPITAL_COMMUNITY): Payer: Medicare HMO

## 2019-10-08 ENCOUNTER — Emergency Department (HOSPITAL_BASED_OUTPATIENT_CLINIC_OR_DEPARTMENT_OTHER)
Admit: 2019-10-08 | Discharge: 2019-10-08 | Disposition: A | Discharge status: Home / Self Care | Payer: Medicare HMO | Attending: Emergency Medicine | Admitting: Emergency Medicine

## 2019-10-08 ENCOUNTER — Other Ambulatory Visit: Payer: Self-pay

## 2019-10-08 ENCOUNTER — Inpatient Hospital Stay (HOSPITAL_COMMUNITY)
Admission: EM | Admit: 2019-10-08 | Discharge: 2019-10-19 | DRG: 622 | Disposition: A | Discharge status: Skilled Nursing Facility | Payer: Medicare HMO | Attending: Student | Admitting: Student

## 2019-10-08 ENCOUNTER — Ambulatory Visit: Payer: Medicare HMO | Admitting: Podiatry

## 2019-10-08 ENCOUNTER — Encounter (HOSPITAL_COMMUNITY): Payer: Self-pay

## 2019-10-08 DIAGNOSIS — N179 Acute kidney failure, unspecified: Secondary | ICD-10-CM | POA: Diagnosis present

## 2019-10-08 DIAGNOSIS — E1159 Type 2 diabetes mellitus with other circulatory complications: Secondary | ICD-10-CM

## 2019-10-08 DIAGNOSIS — I5043 Acute on chronic combined systolic (congestive) and diastolic (congestive) heart failure: Secondary | ICD-10-CM | POA: Diagnosis present

## 2019-10-08 DIAGNOSIS — E1169 Type 2 diabetes mellitus with other specified complication: Secondary | ICD-10-CM | POA: Diagnosis present

## 2019-10-08 DIAGNOSIS — E871 Hypo-osmolality and hyponatremia: Secondary | ICD-10-CM | POA: Diagnosis present

## 2019-10-08 DIAGNOSIS — E1140 Type 2 diabetes mellitus with diabetic neuropathy, unspecified: Secondary | ICD-10-CM | POA: Diagnosis present

## 2019-10-08 DIAGNOSIS — Z6841 Body Mass Index (BMI) 40.0 and over, adult: Secondary | ICD-10-CM

## 2019-10-08 DIAGNOSIS — I2510 Atherosclerotic heart disease of native coronary artery without angina pectoris: Secondary | ICD-10-CM | POA: Diagnosis present

## 2019-10-08 DIAGNOSIS — S301XXA Contusion of abdominal wall, initial encounter: Secondary | ICD-10-CM | POA: Diagnosis present

## 2019-10-08 DIAGNOSIS — X58XXXA Exposure to other specified factors, initial encounter: Secondary | ICD-10-CM | POA: Diagnosis present

## 2019-10-08 DIAGNOSIS — E1165 Type 2 diabetes mellitus with hyperglycemia: Secondary | ICD-10-CM | POA: Diagnosis present

## 2019-10-08 DIAGNOSIS — IMO0002 Reserved for concepts with insufficient information to code with codable children: Secondary | ICD-10-CM

## 2019-10-08 DIAGNOSIS — E11621 Type 2 diabetes mellitus with foot ulcer: Secondary | ICD-10-CM | POA: Diagnosis not present

## 2019-10-08 DIAGNOSIS — I509 Heart failure, unspecified: Secondary | ICD-10-CM | POA: Diagnosis not present

## 2019-10-08 DIAGNOSIS — E1122 Type 2 diabetes mellitus with diabetic chronic kidney disease: Secondary | ICD-10-CM | POA: Diagnosis present

## 2019-10-08 DIAGNOSIS — M7989 Other specified soft tissue disorders: Secondary | ICD-10-CM | POA: Diagnosis not present

## 2019-10-08 DIAGNOSIS — L02612 Cutaneous abscess of left foot: Secondary | ICD-10-CM | POA: Diagnosis present

## 2019-10-08 DIAGNOSIS — I4891 Unspecified atrial fibrillation: Secondary | ICD-10-CM | POA: Diagnosis not present

## 2019-10-08 DIAGNOSIS — I130 Hypertensive heart and chronic kidney disease with heart failure and stage 1 through stage 4 chronic kidney disease, or unspecified chronic kidney disease: Secondary | ICD-10-CM | POA: Diagnosis present

## 2019-10-08 DIAGNOSIS — E11628 Type 2 diabetes mellitus with other skin complications: Secondary | ICD-10-CM | POA: Diagnosis present

## 2019-10-08 DIAGNOSIS — I480 Paroxysmal atrial fibrillation: Secondary | ICD-10-CM

## 2019-10-08 DIAGNOSIS — I081 Rheumatic disorders of both mitral and tricuspid valves: Secondary | ICD-10-CM | POA: Diagnosis present

## 2019-10-08 DIAGNOSIS — R7982 Elevated C-reactive protein (CRP): Secondary | ICD-10-CM | POA: Diagnosis present

## 2019-10-08 DIAGNOSIS — L97402 Non-pressure chronic ulcer of unspecified heel and midfoot with fat layer exposed: Secondary | ICD-10-CM

## 2019-10-08 DIAGNOSIS — I472 Ventricular tachycardia: Secondary | ICD-10-CM | POA: Diagnosis present

## 2019-10-08 DIAGNOSIS — Z794 Long term (current) use of insulin: Secondary | ICD-10-CM

## 2019-10-08 DIAGNOSIS — E118 Type 2 diabetes mellitus with unspecified complications: Secondary | ICD-10-CM

## 2019-10-08 DIAGNOSIS — Z9114 Patient's other noncompliance with medication regimen: Secondary | ICD-10-CM

## 2019-10-08 DIAGNOSIS — M545 Low back pain: Secondary | ICD-10-CM | POA: Diagnosis present

## 2019-10-08 DIAGNOSIS — E08621 Diabetes mellitus due to underlying condition with foot ulcer: Secondary | ICD-10-CM

## 2019-10-08 DIAGNOSIS — I252 Old myocardial infarction: Secondary | ICD-10-CM

## 2019-10-08 DIAGNOSIS — K5900 Constipation, unspecified: Secondary | ICD-10-CM | POA: Diagnosis present

## 2019-10-08 DIAGNOSIS — G8929 Other chronic pain: Secondary | ICD-10-CM | POA: Diagnosis present

## 2019-10-08 DIAGNOSIS — N182 Chronic kidney disease, stage 2 (mild): Secondary | ICD-10-CM | POA: Diagnosis present

## 2019-10-08 DIAGNOSIS — Z20822 Contact with and (suspected) exposure to covid-19: Secondary | ICD-10-CM | POA: Diagnosis present

## 2019-10-08 DIAGNOSIS — I429 Cardiomyopathy, unspecified: Secondary | ICD-10-CM | POA: Diagnosis present

## 2019-10-08 DIAGNOSIS — E785 Hyperlipidemia, unspecified: Secondary | ICD-10-CM | POA: Diagnosis present

## 2019-10-08 DIAGNOSIS — I10 Essential (primary) hypertension: Secondary | ICD-10-CM | POA: Diagnosis present

## 2019-10-08 DIAGNOSIS — H538 Other visual disturbances: Secondary | ICD-10-CM | POA: Diagnosis present

## 2019-10-08 DIAGNOSIS — E1151 Type 2 diabetes mellitus with diabetic peripheral angiopathy without gangrene: Secondary | ICD-10-CM | POA: Diagnosis present

## 2019-10-08 DIAGNOSIS — Z7401 Bed confinement status: Secondary | ICD-10-CM

## 2019-10-08 DIAGNOSIS — R7989 Other specified abnormal findings of blood chemistry: Secondary | ICD-10-CM | POA: Diagnosis present

## 2019-10-08 DIAGNOSIS — G4733 Obstructive sleep apnea (adult) (pediatric): Secondary | ICD-10-CM | POA: Diagnosis present

## 2019-10-08 DIAGNOSIS — L97529 Non-pressure chronic ulcer of other part of left foot with unspecified severity: Secondary | ICD-10-CM

## 2019-10-08 DIAGNOSIS — I4819 Other persistent atrial fibrillation: Secondary | ICD-10-CM | POA: Diagnosis present

## 2019-10-08 DIAGNOSIS — N289 Disorder of kidney and ureter, unspecified: Secondary | ICD-10-CM

## 2019-10-08 DIAGNOSIS — R519 Headache, unspecified: Secondary | ICD-10-CM | POA: Diagnosis present

## 2019-10-08 DIAGNOSIS — L089 Local infection of the skin and subcutaneous tissue, unspecified: Secondary | ICD-10-CM

## 2019-10-08 DIAGNOSIS — Q250 Patent ductus arteriosus: Secondary | ICD-10-CM

## 2019-10-08 DIAGNOSIS — Z79899 Other long term (current) drug therapy: Secondary | ICD-10-CM

## 2019-10-08 DIAGNOSIS — B964 Proteus (mirabilis) (morganii) as the cause of diseases classified elsewhere: Secondary | ICD-10-CM | POA: Diagnosis present

## 2019-10-08 DIAGNOSIS — I428 Other cardiomyopathies: Secondary | ICD-10-CM

## 2019-10-08 HISTORY — DX: Disorder of kidney and ureter, unspecified: N28.9

## 2019-10-08 LAB — CBC WITH DIFFERENTIAL/PLATELET
Abs Immature Granulocytes: 0.04 10*3/uL (ref 0.00–0.07)
Basophils Absolute: 0 10*3/uL (ref 0.0–0.1)
Basophils Relative: 0 %
Eosinophils Absolute: 0.1 10*3/uL (ref 0.0–0.5)
Eosinophils Relative: 1 %
HCT: 40.9 % (ref 39.0–52.0)
Hemoglobin: 12.9 g/dL — ABNORMAL LOW (ref 13.0–17.0)
Immature Granulocytes: 0 %
Lymphocytes Relative: 19 %
Lymphs Abs: 1.8 10*3/uL (ref 0.7–4.0)
MCH: 30.9 pg (ref 26.0–34.0)
MCHC: 31.5 g/dL (ref 30.0–36.0)
MCV: 97.8 fL (ref 80.0–100.0)
Monocytes Absolute: 1 10*3/uL (ref 0.1–1.0)
Monocytes Relative: 10 %
Neutro Abs: 6.4 10*3/uL (ref 1.7–7.7)
Neutrophils Relative %: 70 %
Platelets: 203 10*3/uL (ref 150–400)
RBC: 4.18 MIL/uL — ABNORMAL LOW (ref 4.22–5.81)
RDW: 13.4 % (ref 11.5–15.5)
WBC: 9.3 10*3/uL (ref 4.0–10.5)
nRBC: 0 % (ref 0.0–0.2)

## 2019-10-08 LAB — COMPREHENSIVE METABOLIC PANEL
ALT: 27 U/L (ref 0–44)
AST: 24 U/L (ref 15–41)
Albumin: 2.8 g/dL — ABNORMAL LOW (ref 3.5–5.0)
Alkaline Phosphatase: 76 U/L (ref 38–126)
Anion gap: 11 (ref 5–15)
BUN: 21 mg/dL — ABNORMAL HIGH (ref 6–20)
CO2: 26 mmol/L (ref 22–32)
Calcium: 8.6 mg/dL — ABNORMAL LOW (ref 8.9–10.3)
Chloride: 98 mmol/L (ref 98–111)
Creatinine, Ser: 1.45 mg/dL — ABNORMAL HIGH (ref 0.61–1.24)
GFR calc Af Amer: >60 mL/min (ref >60)
GFR calc non Af Amer: 54 mL/min — ABNORMAL LOW (ref >60)
Glucose, Bld: 127 mg/dL — ABNORMAL HIGH (ref 70–99)
Potassium: 4.3 mmol/L (ref 3.5–5.1)
Sodium: 135 mmol/L (ref 135–145)
Total Bilirubin: 1.1 mg/dL (ref 0.3–1.2)
Total Protein: 7.2 g/dL (ref 6.5–8.1)

## 2019-10-08 LAB — PREALBUMIN: Prealbumin: 9.1 mg/dL — ABNORMAL LOW (ref 18–38)

## 2019-10-08 LAB — C-REACTIVE PROTEIN: CRP: 12 mg/dL — ABNORMAL HIGH (ref <1.0)

## 2019-10-08 LAB — LACTIC ACID, PLASMA: Lactic Acid, Venous: 1.5 mmol/L (ref 0.5–1.9)

## 2019-10-08 LAB — HEMOGLOBIN A1C
Hgb A1c MFr Bld: 11.2 % — ABNORMAL HIGH (ref 4.8–5.6)
Mean Plasma Glucose: 274.74 mg/dL

## 2019-10-08 MED ORDER — HEPARIN (PORCINE) 25000 UT/250ML-% IV SOLN
3150.0000 [IU]/h | INTRAVENOUS | Status: DC
  Administered 2019-10-08: 1800 [IU]/h via INTRAVENOUS
  Administered 2019-10-09: 2350 [IU]/h via INTRAVENOUS
  Administered 2019-10-09 – 2019-10-10 (×2): 2850 [IU]/h via INTRAVENOUS
  Administered 2019-10-10: 3150 [IU]/h via INTRAVENOUS
  Filled 2019-10-08 (×5): qty 250

## 2019-10-08 MED ORDER — DILTIAZEM HCL-DEXTROSE 125-5 MG/125ML-% IV SOLN (PREMIX)
5.0000 mg/h | INTRAVENOUS | Status: DC
  Administered 2019-10-08: 5 mg/h via INTRAVENOUS
  Administered 2019-10-09: 12.5 mg/h via INTRAVENOUS
  Filled 2019-10-08 (×3): qty 125

## 2019-10-08 MED ORDER — SODIUM CHLORIDE 0.9% FLUSH: 3.0000 mL | INTRAVENOUS | Status: DC | PRN

## 2019-10-08 MED ORDER — INSULIN NPH (HUMAN) (ISOPHANE) 100 UNIT/ML ~~LOC~~ SUSP
30.0000 [IU] | Freq: Two times a day (BID) | SUBCUTANEOUS | Status: DC
  Filled 2019-10-08: qty 10

## 2019-10-08 MED ORDER — ONDANSETRON HCL 4 MG/2ML IJ SOLN: 4.0000 mg | Freq: Four times a day (QID) | INTRAMUSCULAR | Status: DC | PRN

## 2019-10-08 MED ORDER — ACETAMINOPHEN 325 MG PO TABS
650.0000 mg | ORAL_TABLET | ORAL | Status: DC | PRN
  Administered 2019-10-09 – 2019-10-13 (×4): 650 mg via ORAL
  Filled 2019-10-08 (×4): qty 2

## 2019-10-08 MED ORDER — INSULIN NPH (HUMAN) (ISOPHANE) 100 UNIT/ML ~~LOC~~ SUSP
40.0000 [IU] | Freq: Two times a day (BID) | SUBCUTANEOUS | Status: DC
  Administered 2019-10-09 – 2019-10-10 (×3): 40 [IU] via SUBCUTANEOUS
  Filled 2019-10-08 (×2): qty 10

## 2019-10-08 MED ORDER — AMOXICILLIN-POT CLAVULANATE 875-125 MG PO TABS
1.0000 | ORAL_TABLET | Freq: Two times a day (BID) | ORAL | Status: DC
  Administered 2019-10-09: 1 via ORAL
  Filled 2019-10-08: qty 1

## 2019-10-08 MED ORDER — DILTIAZEM LOAD VIA INFUSION
15.0000 mg | Freq: Once | INTRAVENOUS | Status: AC
  Administered 2019-10-08: 15 mg via INTRAVENOUS
  Filled 2019-10-08: qty 15

## 2019-10-08 MED ORDER — HEPARIN BOLUS VIA INFUSION
6000.0000 [IU] | Freq: Once | INTRAVENOUS | Status: AC
  Administered 2019-10-08: 6000 [IU] via INTRAVENOUS
  Filled 2019-10-08: qty 6000

## 2019-10-08 MED ORDER — AMOXICILLIN-POT CLAVULANATE 875-125 MG PO TABS
1.0000 | ORAL_TABLET | Freq: Once | ORAL | Status: AC
  Administered 2019-10-08: 1 via ORAL
  Filled 2019-10-08: qty 1

## 2019-10-08 MED ORDER — SODIUM CHLORIDE 0.9% FLUSH
3.0000 mL | Freq: Two times a day (BID) | INTRAVENOUS | Status: DC
  Administered 2019-10-08 – 2019-10-19 (×14): 3 mL via INTRAVENOUS

## 2019-10-08 MED ORDER — FUROSEMIDE 10 MG/ML IJ SOLN
40.0000 mg | Freq: Once | INTRAMUSCULAR | Status: AC
  Administered 2019-10-08: 40 mg via INTRAVENOUS
  Filled 2019-10-08: qty 4

## 2019-10-08 MED ORDER — TORSEMIDE 20 MG PO TABS
40.0000 mg | ORAL_TABLET | Freq: Every day | ORAL | Status: DC
  Administered 2019-10-09: 40 mg via ORAL
  Filled 2019-10-08: qty 2

## 2019-10-08 MED ORDER — SODIUM CHLORIDE 0.9 % IV SOLN: 250.0000 mL | INTRAVENOUS | Status: DC | PRN

## 2019-10-08 MED ORDER — INSULIN ASPART 100 UNIT/ML ~~LOC~~ SOLN
0.0000 [IU] | Freq: Three times a day (TID) | SUBCUTANEOUS | Status: DC
  Administered 2019-10-09 – 2019-10-10 (×3): 3 [IU] via SUBCUTANEOUS
  Administered 2019-10-10: 4 [IU] via SUBCUTANEOUS

## 2019-10-08 MED ORDER — OXYCODONE-ACETAMINOPHEN 5-325 MG PO TABS
1.0000 | ORAL_TABLET | Freq: Four times a day (QID) | ORAL | Status: DC | PRN
  Administered 2019-10-10 – 2019-10-18 (×19): 1 via ORAL
  Filled 2019-10-08 (×20): qty 1

## 2019-10-08 NOTE — H&P (Signed)
History and Physical    Maurice Nguyen NOM:[id number] DOB: [birth year] DOA: 10/08/2019  PCP: Tracey Harries, MD  Patient coming from: Home  I have personally briefly reviewed patient's old medical records in [facility]  Chief Complaint: wound infection  HPI: Maurice Nguyen is a 54 y.o. male with medical history significant of DM2, HTN, HFrEF with EF 30-35% in 2016, PAF.  Patient with painful L diabetic foot ulcer being followed by podiatry.  Due to pain with walking, patient stopped taking his diuretic medication so he wouldn't have to go to bathroom as often.  Since that time, over past 2 weeks, he has had increasing swelling in BLE, L>R.  Denies SOB or cough.   ED Course: CXR shows mild CHF, Creat 1.4 up from 1.0 baseline.  Imaging of foot shows no osteomyelitis, does show small amount of superficial gas that radiologist thinks just correlates with ulcer.  Pt with A.Fib RVR, rate 130s.  Cardizem bolus and gtt ordered.  40mg  IV lasix ordered.   Review of Systems: As per HPI, otherwise all review of systems negative.  Past Medical History:  Diagnosis Date  . A-fib (HCC)   . Anticoagulated, on Xarelto, Chads2Vas score 2 10/02/2013  . Back pain, chronic 07/13/2012  . CHF (congestive heart failure) (HCC)   . 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 CKD (chronic kidney disease), stage II   . Complication of anesthesia    " THEY LOST ME AND BROUGHT ME BACK "  . Hypertension 07/13/2012  . Irregular heart beat   . Lumbar herniated disc   . Morbid obesity (HCC)   . Obesity, morbid, BMI 50 or higher (HCC) 07/13/2012  . 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 Renal insufficiency 10/08/2019  . Shortness of breath   . Type II diabetes mellitus (HCC) 07/13/2012    Past Surgical History:  Procedure Laterality Date  . ABSCESS DRAINAGE    . COLONOSCOPY  08/22/2012   Procedure: COLONOSCOPY;  Surgeon: Shirley Friar, MD;  Location: [facility];   Service: Endoscopy;  Laterality: N/A;  . DENTAL SURGERY       reports that he has never smoked. He has never used smokeless tobacco. He reports that he does not drink alcohol or use drugs.  No Known Allergies  No family history on file. No reported sick contacts.  Prior to Admission medications   Medication Sig Start Date End Date Taking? Authorizing Provider  collagenase (SANTYL) ointment Apply 1 application topically daily. 09/27/19  Yes Vivi Barrack, DPM  insulin isophane & regular human (NOVOLIN 70/30 FLEXPEN) (70-30) 100 UNIT/ML KwikPen Inject 60 Units into the skin in the morning and at bedtime.   Yes [provider]  metFORMIN (GLUCOPHAGE-XR) 500 MG 24 hr tablet Take 1,000 mg by mouth in the morning and at bedtime.   Yes [provider]  naproxen sodium (ALEVE) 220 MG tablet Take 220-440 mg by mouth 2 (two) times daily as needed (for pain or headaches).   Yes [provider]  nitroGLYCERIN (NITROSTAT) 0.4 MG SL tablet Place 1 tablet (0.4 mg total) under the tongue every 5 (five) minutes x 3 doses as needed for chest pain. 02/03/15  Yes [name], MD  oxyCODONE-acetaminophen (PERCOCET/ROXICET) 5-325 MG tablet Take 1 tablet by mouth every 6 (six) hours as needed for moderate pain. 10/04/15  Yes Osvaldo Shipper, MD  torsemide (DEMADEX) 20 MG tablet TAKE 2 TABLETS BY MOUTH ONCE DAILY Patient taking  differently: Take 40 mg by mouth daily.  05/21/16  Yes Nahser, Deloris Ping, MD  ammonium lactate (AMLACTIN) 12 % cream Apply topically as needed for dry skin. Patient not taking: Reported on 10/08/2019 03/23/19   Vivi Barrack, DPM  BD PEN NEEDLE NANO 2ND GEN 32G X 4 MM MISC in the morning, at noon, and at bedtime.  03/07/19   [provider]    Physical Exam: Vitals:   10/08/19 1924 10/08/19 2015 10/08/19 2030 10/08/19 2200  BP:  (!) 150/92  (!) 151/99  Pulse: (!) 122  (!) 131 (!) 120  Resp: (!) 24 (!) 28 (!) 33 (!) 30  Temp:      TempSrc:       SpO2: 98%  96% 98%  Weight:      Height:        Constitutional: NAD, calm, comfortable Eyes: PERRL, lids and conjunctivae normal ENMT: Mucous membranes are moist. Posterior pharynx clear of any exudate or lesions.Normal dentition.  Neck: normal, supple, no masses, no thyromegaly Respiratory: clear to auscultation bilaterally, no wheezing, no crackles. Normal respiratory effort. No accessory muscle use.  Cardiovascular: IRR, IRR, tachycardic, no murmurs / rubs / gallops. BLE edema present. 2+ pedal pulses. No carotid bruits.  Abdomen: no tenderness, no masses palpated. No hepatosplenomegaly. Bowel sounds positive.  Musculoskeletal: no clubbing / cyanosis. No joint deformity upper and lower extremities. Good ROM, no contractures. Normal muscle tone.  Skin: Ulcer to plantar surface of L foot.  Also R ankle. Neurologic: CN 2-12 grossly intact. Sensation intact, DTR normal. Strength 5/5 in all 4.  Psychiatric: Normal judgment and insight. Alert and oriented x 3. Normal mood.    Labs on Admission: I have personally reviewed following labs and imaging studies  CBC: Recent Labs  Lab 10/08/19 1520  WBC 9.3  NEUTROABS 6.4  HGB 12.9*  HCT 40.9  MCV 97.8  PLT 203   Basic Metabolic Panel: Recent Labs  Lab 10/08/19 1520  NA 135  K 4.3  CL 98  CO2 26  GLUCOSE 127*  BUN 21*  CREATININE 1.45*  CALCIUM 8.6*   GFR: Estimated Creatinine Clearance: 103.6 mL/min (A) (by C-G formula based on SCr of 1.45 mg/dL (H)). Liver Function Tests: Recent Labs  Lab 10/08/19 1520  AST 24  ALT 27  ALKPHOS 76  BILITOT 1.1  PROT 7.2  ALBUMIN 2.8*   No results for input(s): LIPASE, AMYLASE in the last 168 hours. No results for input(s): AMMONIA in the last 168 hours. Coagulation Profile: No results for input(s): INR, PROTIME in the last 168 hours. Cardiac Enzymes: No results for input(s): CKTOTAL, CKMB, CKMBINDEX, TROPONINI in the last 168 hours. BNP (last 3 results) No results for  input(s): PROBNP in the last 8760 hours. HbA1C: No results for input(s): HGBA1C in the last 72 hours. CBG: No results for input(s): GLUCAP in the last 168 hours. Lipid Profile: No results for input(s): CHOL, HDL, LDLCALC, TRIG, CHOLHDL, LDLDIRECT in the last 72 hours. Thyroid Function Tests: No results for input(s): TSH, T4TOTAL, FREET4, T3FREE, THYROIDAB in the last 72 hours. Anemia Panel: No results for input(s): VITAMINB12, FOLATE, FERRITIN, TIBC, IRON, RETICCTPCT in the last 72 hours. Urine analysis:    Component Value Date/Time   COLORURINE AMBER (A) 10/01/2015 1652   APPEARANCEUR CLOUDY (A) 10/01/2015 1652   LABSPEC 1.027 10/01/2015 1652   PHURINE 5.5 10/01/2015 1652   GLUCOSEU >1000 (A) 10/01/2015 1652   HGBUR NEGATIVE 10/01/2015 1652   BILIRUBINUR NEGATIVE 10/01/2015 1652  KETONESUR NEGATIVE 10/01/2015 1652   PROTEINUR 30 (A) 10/01/2015 1652   UROBILINOGEN 1.0 07/07/2012 1544   NITRITE NEGATIVE 10/01/2015 1652   LEUKOCYTESUR NEGATIVE 10/01/2015 1652    Radiological Exams on Admission: DG Chest 2 View  Result Date: 10/08/2019 CLINICAL DATA:  54 year old male with history of shortness of breath. EXAM: CHEST - 2 VIEW COMPARISON:  Chest x-ray 10/01/2015. FINDINGS: There is cephalization of the pulmonary vasculature and slight indistinctness of the interstitial markings suggestive of mild pulmonary edema. No definite pleural effusions. Mild cardiomegaly. Upper mediastinal contours are within normal limits. IMPRESSION: 1. The appearance of the chest suggests mild congestive heart failure, as above Electronically Signed   By: Vinnie Langton M.D.   On: 10/08/2019 15:55   CT FOOT LEFT W CONTRAST  Result Date: 10/08/2019 CLINICAL DATA:  Lower extremity abscess. EXAM: CT OF THE LOWER LEFT EXTREMITY WITH CONTRAST TECHNIQUE: Multidetector CT imaging of the lower left extremity was performed according to the standard protocol following intravenous contrast administration. COMPARISON:   Foot radiograph earlier today. CONTRAST:  100 cc Omnipaque 300 IV FINDINGS: Bones/Joint/Cartilage No bony destruction or periosteal reaction. No fracture or dislocation. Suggestion of pes planus. No ankle joint effusion. Ligaments Suboptimally assessed by CT. Muscles and Tendons No intramuscular fluid collection. There is fatty atrophy of the intrinsic musculature of the foot. Soft tissues Skin defect about the plantar aspect of the midfoot subjacent to the fifth metatarsal. Patchy soft tissue air in the subcutaneous tissues of the foot remains localized to the superficial compartment. There is associated skin thickening. No focal fluid collection or abscess. No radiopaque foreign body. Prominent generalized subcutaneous soft tissue edema is noted. Age advanced vascular calcifications. IMPRESSION: 1. Skin defect about the plantar aspect of the midfoot subjacent to the fifth metatarsal. Patchy soft tissue air remains localized to the superficial soft tissues of the plantar foot. This is likely related ulcer, however recommend clinical correlation for gangrene. No focal fluid collection or abscess. No radiopaque foreign body. No CT findings of osteomyelitis. 2. Prominent generalized subcutaneous soft tissue edema about the foot. 3. Age advanced vascular calcifications. Electronically Signed   By: Keith Rake M.D.   On: 10/08/2019 21:06   DG Foot Complete Left  Result Date: 10/08/2019 CLINICAL DATA:  Short of breath EXAM: LEFT FOOT - COMPLETE 3+ VIEW COMPARISON:  09/03/2019 FINDINGS: Pes planus. Negative for fracture or arthropathy.  Arterial calcification. IMPRESSION: No acute abnormality Electronically Signed   By: Franchot Gallo M.D.   On: 10/08/2019 15:55   VAS [place] LOWER EXTREMITY VENOUS (DVT) (ONLY [facility] & [facility])  Result Date: 10/08/2019  Lower Venous DVT Study Indications: Swelling.  Risk Factors: None identified. Limitations: Body habitus, poor ultrasound/tissue interface and patient positioning.  Comparison Study: No prior studies. Performing Technologist: Oliver Hum RVT  Examination Guidelines: A complete evaluation includes B-mode imaging, spectral Doppler, color Doppler, and power Doppler as needed of all accessible portions of each vessel. Bilateral testing is considered an integral part of a complete examination. Limited examinations for reoccurring indications may be performed as noted. The reflux portion of the exam is performed with the patient in reverse Trendelenburg.  +-----+---------------+---------+-----------+----------+--------------+ RIGHTCompressibilityPhasicitySpontaneityPropertiesThrombus Aging +-----+---------------+---------+-----------+----------+--------------+ CFV  Full           Yes      Yes                                 +-----+---------------+---------+-----------+----------+--------------+   +---------+---------------+---------+-----------+----------+-------------------+ LEFT  CompressibilityPhasicitySpontaneityPropertiesThrombus Aging      +---------+---------------+---------+-----------+----------+-------------------+ CFV      Full           Yes      Yes                                      +---------+---------------+---------+-----------+----------+-------------------+ SFJ      Full                                                             +---------+---------------+---------+-----------+----------+-------------------+ FV Prox  Full                                                             +---------+---------------+---------+-----------+----------+-------------------+ FV Mid   Full           Yes      Yes                                      +---------+---------------+---------+-----------+----------+-------------------+ FV Distal               Yes      Yes                                      +---------+---------------+---------+-----------+----------+-------------------+ POP      Full           Yes       Yes                                      +---------+---------------+---------+-----------+----------+-------------------+ PTV                                                   Patency shown with                                                        color doppler       +---------+---------------+---------+-----------+----------+-------------------+ PERO                                                  Patency shown with  color doppler       +---------+---------------+---------+-----------+----------+-------------------+     Summary: RIGHT: - No evidence of common femoral vein obstruction.  LEFT: - There is no evidence of deep vein thrombosis in the lower extremity. However, portions of this examination were limited- see technologist comments above.  - No cystic structure found in the popliteal fossa.  *See table(s) above for measurements and observations. Electronically signed by Fabienne Bruns MD on 10/08/2019 at 7:02:07 PM.    Final     EKG: Independently reviewed.  Assessment/Plan Principal Problem:   Atrial fibrillation with RVR Active Problems:   Diabetes mellitus type 2 with complications, uncontrolled (HCC)   Essential hypertension   AKI (acute kidney injury) (HCC)   Diabetic ulcer of left foot (HCC)   Acute on chronic combined systolic (congestive) and diastolic (congestive) heart failure (HCC)    1. A.Fib RVR - 1. Cardizem gtt 2. Heparin gtt 1. CHADS VASC of 4 2. Looks like he was on Xarelto in past, not sure why hes not on this anymore.  Cards note in 2019 said to continue it. 3. Tele monitor 2. Acute on chronic combined CHF - 1. CHF pathway 2. Lasix 40mg  IV in ED 3. Resume torsemide in AM 4. 2d echo 5. Tele monitor 3. Diabetic foot ulcer - 1. Diabetic foot ulcer pathway 2. augmentin for the moment 3. MRSA PCR nares 4. Wound care consult 5. Sees Dr. 4. HTN - 1. On cardizem gtt  currently 5. DM2 -  1. Takes 60u of 70-30 BID at home 2. Will put on 40u NPH BID 3. And resistant scale SSI AC 4. Hold metformin 6. AKI - mild 1. Strict intake and output 2. Daily BMP with diuresis  DVT prophylaxis: Heparin gtt Code Status: Full Family Communication: No family in room Disposition Plan: Home after admit Consults called: None Admission status: Place in 55     Trong Gosling M. DO [facility]  How to contact the [facility] Attending or Consulting provider 7A - 7P or covering provider during after hours 7P -7A, for this patient?  1. Check the care team in [facility] and look for a) attending/consulting [facility] provider listed and b) the [facility] team listed 2. Log into [email or web address]  Amion Physician Scheduling and messaging for groups and whole hospitals  On call and physician scheduling software for group practices, residents, hospitalists and other medical providers for call, clinic, rotation and shift schedules. On[employer] is a hospital-wide system for scheduling doctors and paging doctors on call. EasyPlot is for scientific plotting and data analysis.  [email or web address]  and use [place]'s universal password to access. If you do not have the password, please contact the hospital operator.  3. Locate the [facility] provider you are looking for under [facility] and page to a number that you can be directly reached. 4. If you still have difficulty reaching the provider, please page the [facility] (Director on Call) for the Hospitalists listed on amion for assistance.  10/08/2019, 10:09 PM

## 2019-10-08 NOTE — ED Notes (Signed)
Went to get patient for CT Scan of left foot. Patient stated he needed to talk to nurse first. States he had already had CT of foot. I explained that he had not. He expressed concerns of having to stand to get on CT table. He was told he would not have to stand to transfer to CT table, and that we could slide him. He stated " This is crazy as hell. I want to see my nursre. "  [place] RN was informed.

## 2019-10-08 NOTE — Progress Notes (Signed)
Left lower extremity venous duplex has been completed. Preliminary results can be found in CV Proc through chart review.  Results were given to Dr. Effie Shy.  10/08/19 6:56 PM Olen Cordial RVT

## 2019-10-08 NOTE — ED Notes (Signed)
IV team at bedside 

## 2019-10-08 NOTE — ED Triage Notes (Signed)
Pt reports diabetic foot ulcer to the bottom of his left foot, and increased sob over the past week. States he hasnt taken his fluid pills because it hurts too much to bare weight on his leg to go to the restroom. Pt a.o, resp e.u at this time.

## 2019-10-08 NOTE — Progress Notes (Signed)
ANTICOAGULATION CONSULT NOTE - Initial Consult  Pharmacy Consult for heparin Indication: atrial fibrillation  No Known Allergies  Patient Measurements: Height: 6\' 2"  (188 cm) Weight: (!) 430 lb (195 kg) IBW/kg (Calculated) : 82.2 Heparin Dosing Weight: 130kg  Vital Signs: Temp: 98.9 F (37.2 C) (03/15 1501) Temp Source: Oral (03/15 1501) BP: 150/92 (03/15 2015) Pulse Rate: 131 (03/15 2030)  Labs: Recent Labs    10/08/19 1520  HGB 12.9*  HCT 40.9  PLT 203  CREATININE 1.45*    Estimated Creatinine Clearance: 103.6 mL/min (A) (by C-G formula based on SCr of 1.45 mg/dL (H)).   Medical History: Past Medical History:  Diagnosis Date  . A-fib (HCC)   . Anticoagulated, on Xarelto, Chads2Vas score 2 10/02/2013  . Back pain, chronic 07/13/2012  . CHF (congestive heart failure) (HCC)   . 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 CKD (chronic kidney disease), stage II   . Complication of anesthesia    " THEY LOST ME AND BROUGHT ME BACK "  . Hypertension 07/13/2012  . Irregular heart beat   . Lumbar herniated disc   . Morbid obesity (HCC)   . Obesity, morbid, BMI 50 or higher (HCC) 07/13/2012  . 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 Renal insufficiency 10/08/2019  . Shortness of breath   . Type II diabetes mellitus (HCC) 07/13/2012    Medications:  Infusions:  . sodium chloride    . diltiazem (CARDIZEM) infusion 5 mg/hr (10/08/19 2231)  . heparin      Assessment: 53 yom presented to the ED with a wound infection. Found to be in afib. He has a history of this and was prescribed xarelto but he is not longer taking it. To start IV heparin. Baseline CBC is WNL.  Goal of Therapy:  Heparin level 0.3-0.7 units/ml Monitor platelets by anticoagulation protocol: Yes   Plan:  Heparin bolus 6000 units IV x 1 Heparin gtt 1800 units/hr Check a 6 hr heparin level Daily heparin level and CBC  Adelai Achey, 10/10/19 10/08/2019,10:03 PM

## 2019-10-08 NOTE — ED Notes (Signed)
Followed up on CT tech reporting that pt would like to see nurse prior to CT scan; pt reports concerns for valuables in room that he "[doesn't] want everybody knowing about or sitting in the room". Valuable stored in security safe, security officer came to room as requested by pt to pick up valuables. Pt would like to keep phone at bedside at this time. Voices concern for CT, reassured pt that head could be elevated with wedge during scan (h/o COPD with orthopnea) and than only legs would be in scanner (claustrophobic) , called CT to confirm this. Pt agrees to scan at this time, CT notified ok to transport.

## 2019-10-08 NOTE — ED Provider Notes (Signed)
[facility] EMERGENCY DEPARTMENT Provider Note   CSN: [id number] Arrival date & time: 10/08/19  1455     History Chief Complaint  Patient presents with  . Shortness of Breath  . Wound Infection    Maurice Nguyen is a 54 y.o. male.  HPI He complains of pain and drainage from wound on left foot, with worsening ability to tolerate his weight with walking.  Because of the pain he has stopped taking his diuretic medication, to avoid standing, over the last 2 weeks.  Since then he has noticed increasing swelling in his legs, left greater than right.  He is changing dressing on the wound of the left foot, daily, and applying collagenase cream, with help from a family member.  He states he saw his endocrinologist last week and was put on a different medication for diabetes, to "bring down my A1c."  He denies fever, chills or vomiting.  He denies shortness of breath or cough.  There are no other known modifying factors.    Past Medical History:  Diagnosis Date  . A-fib (HCC)   . Anticoagulated, on Xarelto, Chads2Vas score 2 10/02/2013  . Back pain, chronic 07/13/2012  . CHF (congestive heart failure) (HCC)   . 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 CKD (chronic kidney disease), stage II   . Complication of anesthesia    " THEY LOST ME AND BROUGHT ME BACK "  . Hypertension 07/13/2012  . Irregular heart beat   . Lumbar herniated disc   . Morbid obesity (HCC)   . Obesity, morbid, BMI 50 or higher (HCC) 07/13/2012  . 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 Renal insufficiency 10/08/2019  . Shortness of breath   . Type II diabetes mellitus (HCC) 07/13/2012    Patient Active Problem List   Diagnosis Date Noted  . Diabetic ulcer of left foot (HCC) 10/08/2019  . Hyperlipidemia associated with type 2 diabetes mellitus (HCC) 11/09/2017  . Morbid obesity with BMI of 50.0-59.9, adult (HCC) 11/09/2017  . B12 deficiency 06/09/2017  . Neuropathy  associated with endocrine disorder (HCC) 05/03/2017  . Other fatigue 05/03/2017  . OSA (obstructive sleep apnea) 11/11/2016  . Need for hepatitis C screening test 12/01/2015  . Sepsis (HCC) 10/01/2015  . CAP (community acquired pneumonia) 10/01/2015  . Right foot pain 10/01/2015  . AKI (acute kidney injury) (HCC) 10/01/2015  . Hematemesis 10/01/2015  . Chronic bilateral low back pain without sciatica 09/02/2015  . Chronic ulcer of lower extremity (HCC) 04/08/2015  . Chronic combined systolic and diastolic heart failure (HCC) 04/08/2015  . Type 2 diabetes mellitus (HCC) 01/31/2015  . Anticoagulated, on Xarelto, Chads2Vas score 2 10/02/2013  . History of anticoagulant therapy 10/02/2013  . Atrial fibrillation with RVR 09/27/2013  . Hypertensive urgency 09/27/2013  . Acute combined systolic and diastolic CHF, NYHA class 4, difficult to assess Diastolic function on echo 09/27/2013  . New onset atrial fibrillation (HCC) 08/22/2012  . 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 Obesity, morbid, BMI 50 or higher (HCC) 07/13/2012  . Back pain, chronic 07/13/2012  . Diabetes mellitus type 2 with complications, uncontrolled (HCC) 04/18/2009  . Essential hypertension 04/18/2009  . OTHER POSTSURGICAL STATUS OTHER 04/18/2009    Past Surgical History:  Procedure Laterality Date  . ABSCESS DRAINAGE    . COLONOSCOPY  08/22/2012   Procedure: COLONOSCOPY;  Surgeon: Shirley Friar, MD;  Location: [facility] ENDOSCOPY;  Service: Endoscopy;  Laterality: N/A;  . DENTAL SURGERY  No family history on file.  Social History   Tobacco Use  . Smoking status: Never Smoker  . Smokeless tobacco: Never Used  Substance Use Topics  . Alcohol use: No    Comment: rarely  . Drug use: No    Home Medications Prior to Admission medications   Medication Sig Start Date End Date Taking? Authorizing Provider  collagenase (SANTYL) ointment Apply 1 application topically daily. 09/27/19  Yes Vivi Barrack, DPM    insulin isophane & regular human (NOVOLIN 70/30 FLEXPEN) (70-30) 100 UNIT/ML KwikPen Inject 60 Units into the skin in the morning and at bedtime.   Yes [provider]  metFORMIN (GLUCOPHAGE-XR) 500 MG 24 hr tablet Take 1,000 mg by mouth in the morning and at bedtime.   Yes [provider]  naproxen sodium (ALEVE) 220 MG tablet Take 220-440 mg by mouth 2 (two) times daily as needed (for pain or headaches).   Yes [provider]  nitroGLYCERIN (NITROSTAT) 0.4 MG SL tablet Place 1 tablet (0.4 mg total) under the tongue every 5 (five) minutes x 3 doses as needed for chest pain. 02/03/15  Yes [name], MD  oxyCODONE-acetaminophen (PERCOCET/ROXICET) 5-325 MG tablet Take 1 tablet by mouth every 6 (six) hours as needed for moderate pain. 10/04/15  Yes Osvaldo Shipper, MD  torsemide (DEMADEX) 20 MG tablet TAKE 2 TABLETS BY MOUTH ONCE DAILY Patient taking differently: Take 40 mg by mouth daily.  05/21/16  Yes Nahser, Deloris Ping, MD  ammonium lactate (AMLACTIN) 12 % cream Apply topically as needed for dry skin. Patient not taking: Reported on 10/08/2019 03/23/19   Vivi Barrack, DPM  BD PEN NEEDLE NANO 2ND GEN 32G X 4 MM MISC in the morning, at noon, and at bedtime.  03/07/19   [provider]    Allergies    Patient has no known allergies.  Review of Systems   Review of Systems  All other systems reviewed and are negative.   Physical Exam Updated Vital Signs BP (!) 150/92   Pulse (!) 131   Temp 98.9 F (37.2 C) (Oral)   Resp (!) 33   Ht  (1.88 m)   Wt (!) 195 kg   SpO2 96%   BMI 55.21 kg/m   Physical Exam Vitals and nursing note reviewed.  Constitutional:      General: He is not in acute distress.    Appearance: He is well-developed. He is obese. He is not ill-appearing, toxic-appearing or diaphoretic.  HENT:     Head: Normocephalic and atraumatic.     Right Ear: External ear normal.     Left Ear: External ear normal.  Eyes:      Conjunctiva/sclera: Conjunctivae normal.     Pupils: Pupils are equal, round, and reactive to light.  Neck:     Trachea: Phonation normal.  Cardiovascular:     Rate and Rhythm: Tachycardia present.  Pulmonary:     Effort: Pulmonary effort is normal. No respiratory distress.     Breath sounds: No stridor.  Abdominal:     General: There is no distension.     Palpations: Abdomen is soft.  Musculoskeletal:        General: Normal range of motion.     Cervical back: Normal range of motion and neck supple.     Comments: Moderate lower extremity edema, left greater than right.  Left foot focal tender and swollen, plantar aspect, with wound lateral mid plantar surface, about 3 cm, with granulation  tissue and moderate amount of drainage on bandage.  Drainage is malodorous.  No associated erythema, or streaking.  Clinically, cannot rule out abscess in the plantar aspect of the foot.  Skin:    General: Skin is warm and dry.     Comments: The skin around the ulcer of the left foot appears somewhat macerated and pale, consistent with chronic wet bandage.  Neurological:     Mental Status: He is alert and oriented to person, place, and time.     Cranial Nerves: No cranial nerve deficit.     Sensory: No sensory deficit.     Motor: No abnormal muscle tone.     Coordination: Coordination normal.  Psychiatric:        Mood and Affect: Mood normal.        Behavior: Behavior normal.        Thought Content: Thought content normal.        Judgment: Judgment normal.     ED Results / Procedures / Treatments   Labs (all labs ordered are listed, but only abnormal results are displayed) Labs Reviewed  COMPREHENSIVE METABOLIC PANEL - Abnormal; Notable for the following components:      Result Value   Glucose, Bld 127 (*)    BUN 21 (*)    Creatinine, Ser 1.45 (*)    Calcium 8.6 (*)    Albumin 2.8 (*)    GFR calc non Af Amer 54 (*)    All other components within normal limits  CBC WITH  DIFFERENTIAL/PLATELET - Abnormal; Notable for the following components:   RBC 4.18 (*)    Hemoglobin 12.9 (*)    All other components within normal limits  SARS CORONAVIRUS 2 (TAT 6-24 HRS)  LACTIC ACID, PLASMA  HEMOGLOBIN A1C    EKG EKG Interpretation  Date/Time:  Monday October 08 2019 15:03:27 EDT Ventricular Rate:  122 PR Interval:    QRS Duration: 80 QT Interval:  324 QTC Calculation: 461 R Axis:   -53 Text Interpretation: Atrial fibrillation with rapid ventricular response with premature ventricular or aberrantly conducted complexes Left axis deviation Low voltage QRS Inferior infarct , age undetermined Possible Anterolateral infarct , age undetermined Abnormal ECG since last tracing no significant change Confirmed by Mancel Bale [phone]) on 10/08/2019 5:44:40 PM    Radiology DG Chest 2 View  Result Date: 10/08/2019 CLINICAL DATA:  54 year old male with history of shortness of breath. EXAM: CHEST - 2 VIEW COMPARISON:  Chest x-ray 10/01/2015. FINDINGS: There is cephalization of the pulmonary vasculature and slight indistinctness of the interstitial markings suggestive of mild pulmonary edema. No definite pleural effusions. Mild cardiomegaly. Upper mediastinal contours are within normal limits. IMPRESSION: 1. The appearance of the chest suggests mild congestive heart failure, as above Electronically Signed   By: Trudie Reed M.D.   On: 10/08/2019 15:55   CT FOOT LEFT W CONTRAST  Result Date: 10/08/2019 CLINICAL DATA:  Lower extremity abscess. EXAM: CT OF THE LOWER LEFT EXTREMITY WITH CONTRAST TECHNIQUE: Multidetector CT imaging of the lower left extremity was performed according to the standard protocol following intravenous contrast administration. COMPARISON:  Foot radiograph earlier today. CONTRAST:  100 cc Omnipaque 300 IV FINDINGS: Bones/Joint/Cartilage No bony destruction or periosteal reaction. No fracture or dislocation. Suggestion of pes planus. No ankle joint effusion.  Ligaments Suboptimally assessed by CT. Muscles and Tendons No intramuscular fluid collection. There is fatty atrophy of the intrinsic musculature of the foot. Soft tissues Skin defect about the plantar aspect of the midfoot  subjacent to the fifth metatarsal. Patchy soft tissue air in the subcutaneous tissues of the foot remains localized to the superficial compartment. There is associated skin thickening. No focal fluid collection or abscess. No radiopaque foreign body. Prominent generalized subcutaneous soft tissue edema is noted. Age advanced vascular calcifications. IMPRESSION: 1. Skin defect about the plantar aspect of the midfoot subjacent to the fifth metatarsal. Patchy soft tissue air remains localized to the superficial soft tissues of the plantar foot. This is likely related ulcer, however recommend clinical correlation for gangrene. No focal fluid collection or abscess. No radiopaque foreign body. No CT findings of osteomyelitis. 2. Prominent generalized subcutaneous soft tissue edema about the foot. 3. Age advanced vascular calcifications. Electronically Signed   By: Narda Rutherford M.D.   On: 10/08/2019 21:06   DG Foot Complete Left  Result Date: 10/08/2019 CLINICAL DATA:  Short of breath EXAM: LEFT FOOT - COMPLETE 3+ VIEW COMPARISON:  09/03/2019 FINDINGS: Pes planus. Negative for fracture or arthropathy.  Arterial calcification. IMPRESSION: No acute abnormality Electronically Signed   By: Marlan Palau M.D.   On: 10/08/2019 15:55   VAS [place] LOWER EXTREMITY VENOUS (DVT) (ONLY [facility])  Result Date: 10/08/2019  Lower Venous DVT Study Indications: Swelling.  Risk Factors: None identified. Limitations: Body habitus, poor ultrasound/tissue interface and patient positioning. Comparison Study: No prior studies. Performing Technologist: Chanda Busing RVT  Examination Guidelines: A complete evaluation includes B-mode imaging, spectral Doppler, color Doppler, and power Doppler as needed of all  accessible portions of each vessel. Bilateral testing is considered an integral part of a complete examination. Limited examinations for reoccurring indications may be performed as noted. The reflux portion of the exam is performed with the patient in reverse Trendelenburg.  +-----+---------------+---------+-----------+----------+--------------+ RIGHTCompressibilityPhasicitySpontaneityPropertiesThrombus Aging +-----+---------------+---------+-----------+----------+--------------+ CFV  Full           Yes      Yes                                 +-----+---------------+---------+-----------+----------+--------------+   +---------+---------------+---------+-----------+----------+-------------------+ LEFT     CompressibilityPhasicitySpontaneityPropertiesThrombus Aging      +---------+---------------+---------+-----------+----------+-------------------+ CFV      Full           Yes      Yes                                      +---------+---------------+---------+-----------+----------+-------------------+ SFJ      Full                                                             +---------+---------------+---------+-----------+----------+-------------------+ FV Prox  Full                                                             +---------+---------------+---------+-----------+----------+-------------------+ FV Mid   Full           Yes      Yes                                      +---------+---------------+---------+-----------+----------+-------------------+  FV Distal               Yes      Yes                                      +---------+---------------+---------+-----------+----------+-------------------+ POP      Full           Yes      Yes                                      +---------+---------------+---------+-----------+----------+-------------------+ PTV                                                   Patency shown with                                                         color doppler       +---------+---------------+---------+-----------+----------+-------------------+ PERO                                                  Patency shown with                                                        color doppler       +---------+---------------+---------+-----------+----------+-------------------+     Summary: RIGHT: - No evidence of common femoral vein obstruction.  LEFT: - There is no evidence of deep vein thrombosis in the lower extremity. However, portions of this examination were limited- see technologist comments above.  - No cystic structure found in the popliteal fossa.  *See table(s) above for measurements and observations. Electronically signed by Ruta Hinds MD on 10/08/2019 at 7:02:07 PM.    Final     Procedures .Critical Care Performed by: Daleen Bo, MD Authorized by: Daleen Bo, MD   Critical care provider statement:    Critical care time (minutes):  35   Critical care start time:  10/08/2019 5:40 PM   Critical care end time:  10/08/2019 9:39 PM   Critical care time was exclusive of:  Separately billable procedures and treating other patients   Critical care was necessary to treat or prevent imminent or life-threatening deterioration of the following conditions:  Cardiac failure   Critical care was time spent personally by me on the following activities:  Blood draw for specimens, development of treatment plan with patient or surrogate, discussions with consultants, evaluation of patient's response to treatment, examination of patient, obtaining history from patient or surrogate, ordering and performing treatments and interventions, ordering and review of laboratory studies, pulse oximetry, re-evaluation of patient's condition, review of old charts and ordering and review of radiographic studies   (including critical care  time)  Medications Ordered in ED Medications    amoxicillin-clavulanate (AUGMENTIN) 875-125 MG per tablet 1 tablet (has no administration in time range)  furosemide (LASIX) injection 40 mg (has no administration in time range)  diltiazem (CARDIZEM) 1 mg/mL load via infusion 15 mg (has no administration in time range)    And  diltiazem (CARDIZEM) 125 mg in dextrose 5% 125 mL (1 mg/mL) infusion (has no administration in time range)  insulin NPH Human (NOVOLIN N) injection 30 Units (has no administration in time range)  insulin aspart (novoLOG) injection 0-20 Units (has no administration in time range)  torsemide (DEMADEX) tablet 40 mg (has no administration in time range)  oxyCODONE-acetaminophen (PERCOCET/ROXICET) 5-325 MG per tablet 1 tablet (has no administration in time range)    ED Course  I have reviewed the triage vital signs and the nursing notes.  Pertinent labs & imaging results that were available during my care of the patient were reviewed by me and considered in my medical decision making (see chart for details).  Clinical Course as of Oct 07 2153  Mon Oct 08, 2019  1743 Normal except glucose high, BUN high, creatinine high, calcium low, albumin low.  Comprehensive metabolic panel(!) [EW]  1743 Normal  Lactic acid, plasma [EW]  1743 Normal except hemoglobin low  CBC with Differential(!) [EW]  1757 Per radiologist consistent with mild congestive heart failure.  DG Chest 2 View [EW]  2115 Per technologist, no left leg DVT.  VAS [place] LOWER EXTREMITY VENOUS (DVT) (ONLY [facility]) [EW]  2115 No osteo or fracture, interpreted by me  DG Foot Complete Left [EW]  2116 Per radiology, CT consistent with ulcer, and superficial gas, without gross evidence for gangrene, or drainable abscess.   [EW]    Clinical Course User Index [EW] Mancel Bale, MD   MDM Rules/Calculators/A&P     CHA2DS2-VASc Score: 3                  Patient Vitals for the past 24 hrs:  BP Temp Temp src Pulse Resp SpO2 Height Weight  10/08/19 2030 -- --  -- (!) 131 (!) 33 96 % -- --  10/08/19 2015 (!) 150/92 -- -- -- (!) 28 -- -- --  10/08/19 1924 -- -- -- (!) 122 (!) 24 98 % -- --  10/08/19 1916 -- -- -- (!) 128 20 100 % -- --  10/08/19 1845 -- -- -- (!) 124 (!) 7 99 % -- --  10/08/19 1800 -- -- -- (!) 133 20 100 % -- --  10/08/19 1756 (!) 157/91 -- -- (!) 136 (!) 22 100 % -- --  10/08/19 1502 (!) 175/101 -- -- (!) 115 -- 97 %  (1.88 m) (!) 195 kg  10/08/19 1501 -- 98.9 F (37.2 C) Oral -- (!) 22 -- -- --    9:30 PM Reevaluation with update and discussion. After initial assessment and treatment, an updated evaluation reveals findings discussed with the patient all questions answered. He agrees to hospitalization for further care and treatment.Mancel Bale   Medical Decision Making: Patient with poorly: Controlled diabetes presenting with ongoing draining wound from his left foot. He was on oral antibiotics until yesterday when he completed a course of doxycycline. Patient started on Augmentin in the emergency department. No frank abscess on CT imaging, but he will require additional wound care and possibly debridement. He saw his endocrinologist, 2 days ago at that time it was noted that his hemoglobin A1c  was 12%.  He has stopped taking 2 of his medications for diabetes.  He was started on 70/30 insulin, and Metformin.  He was encouraged to check his blood sugar regularly.  He saw his podiatrist times 09/27/2019.  He had some wound debridement at that time.  He was referred to vascular surgery but has not seen them yet.  Patient stopped taking his diuretics because he has too much pain to walk on his left foot, and the diuretic causes him to ambulate frequently to urinate.  He is in mild congestive heart failure with worsening leg swelling.  No evidence of DVT on Doppler imaging today. Patient with persistent atrial fibrillation, tachycardia, with mild congestive heart failure, requires admission for stabilization. Cardizem drip and diuretic  begun in the emergency department. Doubt ACS. Note that the patient has previously been treated with Xarelto, but stopped taking it. Patient has significant history for noncompliance with medications.  OREN BARELLA was evaluated in Emergency Department on 10/08/2019 for the symptoms described in the history of present illness. He was evaluated in the context of the global COVID-19 pandemic, which necessitated consideration that the patient might be at risk for infection with the SARS-CoV-2 virus that causes COVID-19. Institutional protocols and algorithms that pertain to the evaluation of patients at risk for COVID-19 are in a state of rapid change based on information released by regulatory bodies including the CDC and federal and state organizations. These policies and algorithms were followed during the patient's care in the ED.   CRITICAL CARE- Yes Performed by: Mancel Bale   Nursing Notes Reviewed/ Care Coordinated Applicable Imaging Reviewed Interpretation of Laboratory Data incorporated into ED treatment  9:42 PM-Consult complete with hospitalist. Patient case explained and discussed.  He agrees to admit patient for further evaluation and treatment. Call ended at 9:55 PM  Plan: Admit   Final Clinical Impression(s) / ED Diagnoses Final diagnoses:  Acute on chronic congestive heart failure, unspecified heart failure type (HCC)  Atrial fibrillation with RVR (HCC)  Left foot infection  Ulcer of left foot, unspecified ulcer stage [facility])    Rx / DC Orders ED Discharge Orders    None       Mancel Bale, MD 10/08/19 2155

## 2019-10-09 ENCOUNTER — Other Ambulatory Visit: Payer: Self-pay

## 2019-10-09 ENCOUNTER — Observation Stay (HOSPITAL_COMMUNITY): Payer: Medicare HMO

## 2019-10-09 ENCOUNTER — Encounter (HOSPITAL_COMMUNITY): Payer: Self-pay | Admitting: Internal Medicine

## 2019-10-09 DIAGNOSIS — I428 Other cardiomyopathies: Secondary | ICD-10-CM | POA: Diagnosis not present

## 2019-10-09 DIAGNOSIS — E1140 Type 2 diabetes mellitus with diabetic neuropathy, unspecified: Secondary | ICD-10-CM | POA: Diagnosis present

## 2019-10-09 DIAGNOSIS — I4891 Unspecified atrial fibrillation: Secondary | ICD-10-CM

## 2019-10-09 DIAGNOSIS — E871 Hypo-osmolality and hyponatremia: Secondary | ICD-10-CM | POA: Diagnosis present

## 2019-10-09 DIAGNOSIS — N182 Chronic kidney disease, stage 2 (mild): Secondary | ICD-10-CM | POA: Diagnosis present

## 2019-10-09 DIAGNOSIS — I472 Ventricular tachycardia: Secondary | ICD-10-CM | POA: Diagnosis present

## 2019-10-09 DIAGNOSIS — I70202 Unspecified atherosclerosis of native arteries of extremities, left leg: Secondary | ICD-10-CM

## 2019-10-09 DIAGNOSIS — E118 Type 2 diabetes mellitus with unspecified complications: Secondary | ICD-10-CM | POA: Diagnosis not present

## 2019-10-09 DIAGNOSIS — M79671 Pain in right foot: Secondary | ICD-10-CM

## 2019-10-09 DIAGNOSIS — L02612 Cutaneous abscess of left foot: Secondary | ICD-10-CM | POA: Diagnosis present

## 2019-10-09 DIAGNOSIS — Q250 Patent ductus arteriosus: Secondary | ICD-10-CM | POA: Diagnosis not present

## 2019-10-09 DIAGNOSIS — I5043 Acute on chronic combined systolic (congestive) and diastolic (congestive) heart failure: Secondary | ICD-10-CM | POA: Diagnosis present

## 2019-10-09 DIAGNOSIS — L97529 Non-pressure chronic ulcer of other part of left foot with unspecified severity: Secondary | ICD-10-CM

## 2019-10-09 DIAGNOSIS — E1169 Type 2 diabetes mellitus with other specified complication: Secondary | ICD-10-CM | POA: Diagnosis present

## 2019-10-09 DIAGNOSIS — Z6841 Body Mass Index (BMI) 40.0 and over, adult: Secondary | ICD-10-CM | POA: Diagnosis not present

## 2019-10-09 DIAGNOSIS — I130 Hypertensive heart and chronic kidney disease with heart failure and stage 1 through stage 4 chronic kidney disease, or unspecified chronic kidney disease: Secondary | ICD-10-CM | POA: Diagnosis present

## 2019-10-09 DIAGNOSIS — E1165 Type 2 diabetes mellitus with hyperglycemia: Secondary | ICD-10-CM | POA: Diagnosis present

## 2019-10-09 DIAGNOSIS — I509 Heart failure, unspecified: Secondary | ICD-10-CM | POA: Diagnosis not present

## 2019-10-09 DIAGNOSIS — R7982 Elevated C-reactive protein (CRP): Secondary | ICD-10-CM | POA: Diagnosis present

## 2019-10-09 DIAGNOSIS — E11621 Type 2 diabetes mellitus with foot ulcer: Secondary | ICD-10-CM | POA: Diagnosis present

## 2019-10-09 DIAGNOSIS — H538 Other visual disturbances: Secondary | ICD-10-CM | POA: Diagnosis present

## 2019-10-09 DIAGNOSIS — I2510 Atherosclerotic heart disease of native coronary artery without angina pectoris: Secondary | ICD-10-CM | POA: Diagnosis not present

## 2019-10-09 DIAGNOSIS — B964 Proteus (mirabilis) (morganii) as the cause of diseases classified elsewhere: Secondary | ICD-10-CM | POA: Diagnosis present

## 2019-10-09 DIAGNOSIS — R519 Headache, unspecified: Secondary | ICD-10-CM | POA: Diagnosis present

## 2019-10-09 DIAGNOSIS — I429 Cardiomyopathy, unspecified: Secondary | ICD-10-CM | POA: Diagnosis present

## 2019-10-09 DIAGNOSIS — X58XXXA Exposure to other specified factors, initial encounter: Secondary | ICD-10-CM | POA: Diagnosis present

## 2019-10-09 DIAGNOSIS — E11628 Type 2 diabetes mellitus with other skin complications: Secondary | ICD-10-CM | POA: Diagnosis present

## 2019-10-09 DIAGNOSIS — E1151 Type 2 diabetes mellitus with diabetic peripheral angiopathy without gangrene: Secondary | ICD-10-CM | POA: Diagnosis present

## 2019-10-09 DIAGNOSIS — I482 Chronic atrial fibrillation, unspecified: Secondary | ICD-10-CM

## 2019-10-09 DIAGNOSIS — E785 Hyperlipidemia, unspecified: Secondary | ICD-10-CM | POA: Diagnosis present

## 2019-10-09 DIAGNOSIS — L97909 Non-pressure chronic ulcer of unspecified part of unspecified lower leg with unspecified severity: Secondary | ICD-10-CM

## 2019-10-09 DIAGNOSIS — Z20822 Contact with and (suspected) exposure to covid-19: Secondary | ICD-10-CM | POA: Diagnosis present

## 2019-10-09 DIAGNOSIS — I4819 Other persistent atrial fibrillation: Secondary | ICD-10-CM | POA: Diagnosis present

## 2019-10-09 DIAGNOSIS — L97509 Non-pressure chronic ulcer of other part of unspecified foot with unspecified severity: Secondary | ICD-10-CM

## 2019-10-09 DIAGNOSIS — I4821 Permanent atrial fibrillation: Secondary | ICD-10-CM | POA: Diagnosis not present

## 2019-10-09 DIAGNOSIS — N179 Acute kidney failure, unspecified: Secondary | ICD-10-CM | POA: Diagnosis present

## 2019-10-09 LAB — CBC
HCT: 40.3 % (ref 39.0–52.0)
Hemoglobin: 13.1 g/dL (ref 13.0–17.0)
MCH: 31.4 pg (ref 26.0–34.0)
MCHC: 32.5 g/dL (ref 30.0–36.0)
MCV: 96.6 fL (ref 80.0–100.0)
Platelets: 267 10*3/uL (ref 150–400)
RBC: 4.17 MIL/uL — ABNORMAL LOW (ref 4.22–5.81)
RDW: 13.5 % (ref 11.5–15.5)
WBC: 11.6 10*3/uL — ABNORMAL HIGH (ref 4.0–10.5)
nRBC: 0 % (ref 0.0–0.2)

## 2019-10-09 LAB — BASIC METABOLIC PANEL
Anion gap: 11 (ref 5–15)
BUN: 18 mg/dL (ref 6–20)
CO2: 27 mmol/L (ref 22–32)
Calcium: 8.4 mg/dL — ABNORMAL LOW (ref 8.9–10.3)
Chloride: 97 mmol/L — ABNORMAL LOW (ref 98–111)
Creatinine, Ser: 1.18 mg/dL (ref 0.61–1.24)
GFR calc Af Amer: >60 mL/min (ref >60)
GFR calc non Af Amer: >60 mL/min (ref >60)
Glucose, Bld: 104 mg/dL — ABNORMAL HIGH (ref 70–99)
Potassium: 4.6 mmol/L (ref 3.5–5.1)
Sodium: 135 mmol/L (ref 135–145)

## 2019-10-09 LAB — ECHOCARDIOGRAM COMPLETE
Height: 74 in
Weight: 6148.19 oz

## 2019-10-09 LAB — CBG MONITORING, ED: Glucose-Capillary: 121 mg/dL — ABNORMAL HIGH (ref 70–99)

## 2019-10-09 LAB — BRAIN NATRIURETIC PEPTIDE: B Natriuretic Peptide: 320.8 pg/mL — ABNORMAL HIGH (ref 0.0–100.0)

## 2019-10-09 LAB — SEDIMENTATION RATE: Sed Rate: 53 mm/hr — ABNORMAL HIGH (ref 0–16)

## 2019-10-09 LAB — MRSA PCR SCREENING: MRSA by PCR: NEGATIVE

## 2019-10-09 LAB — HEPARIN LEVEL (UNFRACTIONATED)
Heparin Unfractionated: <0.1 IU/mL — ABNORMAL LOW (ref 0.30–0.70)
Heparin Unfractionated: <0.1 IU/mL — ABNORMAL LOW (ref 0.30–0.70)

## 2019-10-09 LAB — HIV ANTIBODY (ROUTINE TESTING W REFLEX): HIV Screen 4th Generation wRfx: NONREACTIVE

## 2019-10-09 LAB — GLUCOSE, CAPILLARY
Glucose-Capillary: 139 mg/dL — ABNORMAL HIGH (ref 70–99)
Glucose-Capillary: 133 mg/dL — ABNORMAL HIGH (ref 70–99)
Glucose-Capillary: 181 mg/dL — ABNORMAL HIGH (ref 70–99)

## 2019-10-09 LAB — SARS CORONAVIRUS 2 (TAT 6-24 HRS): SARS Coronavirus 2: NEGATIVE

## 2019-10-09 MED ORDER — ADULT MULTIVITAMIN W/MINERALS CH
1.0000 | ORAL_TABLET | Freq: Every day | ORAL | Status: DC
  Administered 2019-10-09 – 2019-10-19 (×9): 1 via ORAL
  Filled 2019-10-09 (×10): qty 1

## 2019-10-09 MED ORDER — FUROSEMIDE 10 MG/ML IJ SOLN
40.0000 mg | Freq: Two times a day (BID) | INTRAMUSCULAR | Status: DC
  Administered 2019-10-09 – 2019-10-13 (×7): 40 mg via INTRAVENOUS
  Filled 2019-10-09 (×6): qty 4

## 2019-10-09 MED ORDER — SODIUM CHLORIDE 0.9% FLUSH
10.0000 mL | Freq: Two times a day (BID) | INTRAVENOUS | Status: DC
  Administered 2019-10-09 – 2019-10-19 (×14): 10 mL

## 2019-10-09 MED ORDER — VANCOMYCIN HCL 1500 MG/300ML IV SOLN
1500.0000 mg | Freq: Two times a day (BID) | INTRAVENOUS | Status: DC
  Administered 2019-10-10 – 2019-10-12 (×4): 1500 mg via INTRAVENOUS
  Filled 2019-10-09 (×7): qty 300

## 2019-10-09 MED ORDER — DILTIAZEM HCL 60 MG PO TABS
60.0000 mg | ORAL_TABLET | Freq: Four times a day (QID) | ORAL | Status: DC
  Administered 2019-10-09 – 2019-10-12 (×10): 60 mg via ORAL
  Filled 2019-10-09 (×10): qty 1

## 2019-10-09 MED ORDER — ENSURE MAX PROTEIN PO LIQD
11.0000 [oz_av] | Freq: Two times a day (BID) | ORAL | Status: DC
  Administered 2019-10-09 – 2019-10-19 (×15): 11 [oz_av] via ORAL
  Filled 2019-10-09 (×21): qty 330

## 2019-10-09 MED ORDER — PIPERACILLIN-TAZOBACTAM 3.375 G IVPB
3.3750 g | Freq: Three times a day (TID) | INTRAVENOUS | Status: DC
  Administered 2019-10-09 – 2019-10-12 (×9): 3.375 g via INTRAVENOUS
  Filled 2019-10-09 (×12): qty 50

## 2019-10-09 MED ORDER — CHLORHEXIDINE GLUCONATE CLOTH 2 % EX PADS
6.0000 | MEDICATED_PAD | Freq: Once | CUTANEOUS | Status: AC
  Administered 2019-10-09: 6 via TOPICAL

## 2019-10-09 MED ORDER — CHLORHEXIDINE GLUCONATE CLOTH 2 % EX PADS: 6.0000 | MEDICATED_PAD | Freq: Once | CUTANEOUS | Status: DC

## 2019-10-09 MED ORDER — LOSARTAN POTASSIUM 25 MG PO TABS
25.0000 mg | ORAL_TABLET | Freq: Every day | ORAL | Status: DC
  Administered 2019-10-09 – 2019-10-14 (×5): 25 mg via ORAL
  Filled 2019-10-09 (×6): qty 1

## 2019-10-09 MED ORDER — HEPARIN BOLUS VIA INFUSION
4000.0000 [IU] | Freq: Once | INTRAVENOUS | Status: AC
  Administered 2019-10-09: 4000 [IU] via INTRAVENOUS
  Filled 2019-10-09: qty 4000

## 2019-10-09 MED ORDER — VANCOMYCIN HCL 10 G IV SOLR
2500.0000 mg | Freq: Once | INTRAVENOUS | Status: AC
  Administered 2019-10-09: 2500 mg via INTRAVENOUS
  Filled 2019-10-09: qty 2500

## 2019-10-09 MED ORDER — SODIUM CHLORIDE 0.9% FLUSH: 10.0000 mL | INTRAVENOUS | Status: DC | PRN

## 2019-10-09 NOTE — Consult Note (Signed)
Reason for Consult: wound infection  Referring Physician: Dr. Alvino Chapel, MD  Maurice Nguyen is an 54 y.o. male.  HPI: 54 year old male with past medical history significant for atrial fibrillation and RVR, congestive heart failure with uncontrolled insulin-dependent diabetes chronic with a A1c of 12 presents to the hospital for worsening infection to left foot as well as swelling.  I last saw the patient on March 4.  He had an ulceration on the bottom of his left foot.  MRI was ordered previous to that as well as blood work.  He did not get the MRI.  He states that over the last 2 weeks the pain worsened he stopped taking his Lasix had increased shortness of breath as well as lower extremity edema.  He did not want to put weight on his foot.  He was also scheduled to follow-up with vascular surgery this week due to abnormal arterial studies.  Past Medical History:  Diagnosis Date  . A-fib (HCC)   . Anticoagulated, on Xarelto, Chads2Vas score 2 10/02/2013  . Back pain, chronic 07/13/2012  . CHF (congestive heart failure) (HCC)   . 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 CKD (chronic kidney disease), stage II   . Complication of anesthesia    " THEY LOST ME AND BROUGHT ME BACK "  . Hypertension 07/13/2012  . Irregular heart beat   . Lumbar herniated disc   . Morbid obesity (HCC)   . Obesity, morbid, BMI 50 or higher (HCC) 07/13/2012  . 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 Renal insufficiency 10/08/2019  . Shortness of breath   . Type II diabetes mellitus (HCC) 07/13/2012    Past Surgical History:  Procedure Laterality Date  . ABSCESS DRAINAGE    . COLONOSCOPY  08/22/2012   Procedure: COLONOSCOPY;  Surgeon: Shirley Friar, MD;  Location: [facility];  Service: Endoscopy;  Laterality: N/A;  . DENTAL SURGERY      History reviewed. No pertinent family history.  Social History:  reports that he has never smoked. He has never used smokeless tobacco. He reports that  he does not drink alcohol or use drugs.  Allergies: No Known Allergies  Medications: Reviewed  Results for orders placed or performed during the hospital encounter of 10/08/19 (from the past 48 hour(s))  Lactic acid, plasma     Status: None   Collection Time: 10/08/19  3:20 PM  Result Value Ref Range   Lactic Acid, Venous 1.5 0.5 - 1.9 mmol/L    Comment: Performed at [facility], [address] [place], [place] [address]  Comprehensive metabolic panel     Status: Abnormal   Collection Time: 10/08/19  3:20 PM  Result Value Ref Range   Sodium 135 135 - 145 mmol/L   Potassium 4.3 3.5 - 5.1 mmol/L   Chloride 98 98 - 111 mmol/L   CO2 26 22 - 32 mmol/L   Glucose, Bld 127 (H) 70 - 99 mg/dL    Comment: Glucose reference range applies only to samples taken after fasting for at least 8 hours.   BUN 21 (H) 6 - 20 mg/dL   Creatinine, Ser 6.78 (H) 0.61 - 1.24 mg/dL   Calcium 8.6 (L) 8.9 - 10.3 mg/dL   Total Protein 7.2 6.5 - 8.1 g/dL   Albumin 2.8 (L) 3.5 - 5.0 g/dL   AST 24 15 - 41 U/L   ALT 27 0 - 44 U/L   Alkaline Phosphatase 76 38 -  126 U/L   Total Bilirubin 1.1 0.3 - 1.2 mg/dL   GFR calc non Af Amer 54 (L) >60 mL/min   GFR calc Af Amer >60 >60 mL/min   Anion gap 11 5 - 15    Comment: Performed at [facility], [address] [place], [place] [address]  CBC with Differential     Status: Abnormal   Collection Time: 10/08/19  3:20 PM  Result Value Ref Range   WBC 9.3 4.0 - 10.5 K/uL   RBC 4.18 (L) 4.22 - 5.81 MIL/uL   Hemoglobin 12.9 (L) 13.0 - 17.0 g/dL   HCT 89.3 81.0 - 17.5 %   MCV 97.8 80.0 - 100.0 fL   MCH 30.9 26.0 - 34.0 pg   MCHC 31.5 30.0 - 36.0 g/dL   RDW 10.2 58.5 - 27.7 %   Platelets 203 150 - 400 K/uL   nRBC 0.0 0.0 - 0.2 %   Neutrophils Relative % 70 %   Neutro Abs 6.4 1.7 - 7.7 K/uL   Lymphocytes Relative 19 %   Lymphs Abs 1.8 0.7 - 4.0 K/uL   Monocytes Relative 10 %   Monocytes Absolute 1.0 0.1 - 1.0 K/uL   Eosinophils Relative 1 %    Eosinophils Absolute 0.1 0.0 - 0.5 K/uL   Basophils Relative 0 %   Basophils Absolute 0.0 0.0 - 0.1 K/uL   Immature Granulocytes 0 %   Abs Immature Granulocytes 0.04 0.00 - 0.07 K/uL    Comment: Performed at [facility], [address] [place], [place] [address]  SARS CORONAVIRUS 2 (TAT 6-24 HRS) Nasopharyngeal Nasopharyngeal Swab     Status: None   Collection Time: 10/08/19  6:08 PM   Specimen: Nasopharyngeal Swab  Result Value Ref Range   SARS Coronavirus 2 NEGATIVE NEGATIVE    Comment: (NOTE) SARS-CoV-2 target nucleic acids are NOT DETECTED. The SARS-CoV-2 RNA is generally detectable in upper and lower respiratory specimens during the acute phase of infection. Negative results do not preclude SARS-CoV-2 infection, do not rule out co-infections with other pathogens, and should not be used as the sole basis for treatment or other patient management decisions. Negative results must be combined with clinical observations, patient history, and epidemiological information. The expected result is Negative. Fact Sheet for Patients: HairSlick.no Fact Sheet for Healthcare Providers: quierodirigir.com This test is not yet approved or cleared by the [place] FDA and  has been authorized for detection and/or diagnosis of SARS-CoV-2 by FDA under an Emergency Use Authorization (EUA). This EUA will remain  in effect (meaning this test can be used) for the duration of the COVID-19 declaration under Section 56 4(b)(1) of the Act, 21 U.S.C. section 360bbb-3(b)(1), unless the authorization is terminated or revoked sooner. Performed at [facility], [address], [place], [place] [address]   Hemoglobin A1c     Status: Abnormal   Collection Time: 10/08/19 10:24 PM  Result Value Ref Range   Hgb A1c MFr Bld 11.2 (H) 4.8 - 5.6 %    Comment: (NOTE) Pre diabetes:          5.7%-6.4% Diabetes:              >6.4% Glycemic  control for   <7.0% adults with diabetes    Mean Plasma Glucose 274.74 mg/dL    Comment: Performed at [facility], [address] [place], [place] [address]  HIV Antibody (routine testing w rflx)     Status: None  Collection Time: 10/08/19 10:24 PM  Result Value Ref Range   HIV Screen 4th Generation wRfx NON REACTIVE NON REACTIVE    Comment: Performed at [facility], [address] [place], [place] [address]  Sedimentation rate     Status: Abnormal   Collection Time: 10/08/19 10:24 PM  Result Value Ref Range   Sed Rate 53 (H) 0 - 16 mm/hr    Comment: Performed at [facility], [address], [place], [place] [address]  C-reactive protein     Status: Abnormal   Collection Time: 10/08/19 10:24 PM  Result Value Ref Range   CRP 12.0 (H) <1.0 mg/dL    Comment: Performed at [facility], [address], [place], [place] [address]  Prealbumin     Status: Abnormal   Collection Time: 10/08/19 10:24 PM  Result Value Ref Range   Prealbumin 9.1 (L) 18 - 38 mg/dL    Comment: Performed at [facility], [address], [place], [place] [address]  Blood Cultures x 2 sites     Status: None (Preliminary result)   Collection Time: 10/08/19 10:24 PM   Specimen: BLOOD  Result Value Ref Range   Specimen Description BLOOD RIGHT UPPER ARM    Special Requests      BOTTLES DRAWN AEROBIC AND ANAEROBIC Blood Culture results may not be optimal due to an inadequate volume of blood received in culture bottles   Culture      NO GROWTH < 12 HOURS Performed at [facility], [address] [place], [place] [address]    Report Status PENDING   Blood Cultures x 2 sites     Status: None (Preliminary result)   Collection Time: 10/08/19 10:45 PM   Specimen: BLOOD  Result Value Ref Range   Specimen Description BLOOD LEFT ARM    Special Requests      BOTTLES DRAWN AEROBIC AND ANAEROBIC Blood Culture results may not be optimal due to an inadequate volume of blood received  in culture bottles   Culture      NO GROWTH < 12 HOURS Performed at [facility], [address] [place], [place] [address]    Report Status PENDING   MRSA PCR Screening     Status: None   Collection Time: 10/09/19  3:12 AM   Specimen: Nasopharyngeal  Result Value Ref Range   MRSA by PCR NEGATIVE NEGATIVE    Comment:        The GeneXpert MRSA Assay (FDA approved for NASAL specimens only), is one component of a comprehensive MRSA colonization surveillance program. It is not intended to diagnose MRSA infection nor to guide or monitor treatment for MRSA infections. Performed at [facility], [address] [place], [place] [address]   Basic metabolic panel     Status: Abnormal   Collection Time: 10/09/19  3:12 AM  Result Value Ref Range   Sodium 135 135 - 145 mmol/L   Potassium 4.6 3.5 - 5.1 mmol/L   Chloride 97 (L) 98 - 111 mmol/L   CO2 27 22 - 32 mmol/L   Glucose, Bld 104 (H) 70 - 99 mg/dL    Comment: Glucose reference range applies only to samples taken after fasting for at least 8 hours.   BUN 18 6 - 20 mg/dL   Creatinine, Ser 9.62 0.61 - 1.24 mg/dL   Calcium 8.4 (L) 8.9 - 10.3 mg/dL   GFR calc non Af Amer >60 >  60 mL/min   GFR calc Af Amer >60 >60 mL/min   Anion gap 11 5 - 15    Comment: Performed at [facility], [address], [place], [place] [address]  Heparin level (unfractionated)     Status: Abnormal   Collection Time: 10/09/19  3:12 AM  Result Value Ref Range   Heparin Unfractionated <0.10 (L) 0.30 - 0.70 IU/mL    Comment: (NOTE) If heparin results are below expected values, and patient dosage has  been confirmed, suggest follow up testing of antithrombin III levels. Performed at [facility], [address], [place], [place] [address]   CBC     Status: Abnormal   Collection Time: 10/09/19  3:12 AM  Result Value Ref Range   WBC 11.6 (H) 4.0 - 10.5 K/uL   RBC 4.17 (L) 4.22 - 5.81 MIL/uL   Hemoglobin 13.1 13.0 - 17.0 g/dL   HCT  82.9 56.2 - 13.0 %   MCV 96.6 80.0 - 100.0 fL   MCH 31.4 26.0 - 34.0 pg   MCHC 32.5 30.0 - 36.0 g/dL   RDW 86.5 78.4 - 69.6 %   Platelets 267 150 - 400 K/uL   nRBC 0.0 0.0 - 0.2 %    Comment: Performed at [facility], [address], [place], [place] [address]  CBG monitoring, ED     Status: Abnormal   Collection Time: 10/09/19  8:23 AM  Result Value Ref Range   Glucose-Capillary 121 (H) 70 - 99 mg/dL    Comment: Glucose reference range applies only to samples taken after fasting for at least 8 hours.   Comment 1 Notify RN    Comment 2 Document in Chart   Glucose, capillary     Status: Abnormal   Collection Time: 10/09/19 11:34 AM  Result Value Ref Range   Glucose-Capillary 139 (H) 70 - 99 mg/dL    Comment: Glucose reference range applies only to samples taken after fasting for at least 8 hours.    DG Chest 2 View  Result Date: 10/08/2019 CLINICAL DATA:  54 year old male with history of shortness of breath. EXAM: CHEST - 2 VIEW COMPARISON:  Chest x-ray 10/01/2015. FINDINGS: There is cephalization of the pulmonary vasculature and slight indistinctness of the interstitial markings suggestive of mild pulmonary edema. No definite pleural effusions. Mild cardiomegaly. Upper mediastinal contours are within normal limits. IMPRESSION: 1. The appearance of the chest suggests mild congestive heart failure, as above Electronically Signed   By: Trudie Reed M.D.   On: 10/08/2019 15:55   CT FOOT LEFT W CONTRAST  Result Date: 10/08/2019 CLINICAL DATA:  Lower extremity abscess. EXAM: CT OF THE LOWER LEFT EXTREMITY WITH CONTRAST TECHNIQUE: Multidetector CT imaging of the lower left extremity was performed according to the standard protocol following intravenous contrast administration. COMPARISON:  Foot radiograph earlier today. CONTRAST:  100 cc Omnipaque 300 IV FINDINGS: Bones/Joint/Cartilage No bony destruction or periosteal reaction. No fracture or dislocation. Suggestion of pes planus. No  ankle joint effusion. Ligaments Suboptimally assessed by CT. Muscles and Tendons No intramuscular fluid collection. There is fatty atrophy of the intrinsic musculature of the foot. Soft tissues Skin defect about the plantar aspect of the midfoot subjacent to the fifth metatarsal. Patchy soft tissue air in the subcutaneous tissues of the foot remains localized to the superficial compartment. There is associated skin thickening. No focal fluid collection or abscess. No radiopaque foreign body. Prominent generalized subcutaneous soft tissue edema is noted. Age advanced  vascular calcifications. IMPRESSION: 1. Skin defect about the plantar aspect of the midfoot subjacent to the fifth metatarsal. Patchy soft tissue air remains localized to the superficial soft tissues of the plantar foot. This is likely related ulcer, however recommend clinical correlation for gangrene. No focal fluid collection or abscess. No radiopaque foreign body. No CT findings of osteomyelitis. 2. Prominent generalized subcutaneous soft tissue edema about the foot. 3. Age advanced vascular calcifications. Electronically Signed   By: Narda Rutherford M.D.   On: 10/08/2019 21:06   DG Foot Complete Left  Result Date: 10/08/2019 CLINICAL DATA:  Short of breath EXAM: LEFT FOOT - COMPLETE 3+ VIEW COMPARISON:  09/03/2019 FINDINGS: Pes planus. Negative for fracture or arthropathy.  Arterial calcification. IMPRESSION: No acute abnormality Electronically Signed   By: Marlan Palau M.D.   On: 10/08/2019 15:55   VAS [place] LOWER EXTREMITY VENOUS (DVT) (ONLY [facility])  Result Date: 10/08/2019  Lower Venous DVT Study Indications: Swelling.  Risk Factors: None identified. Limitations: Body habitus, poor ultrasound/tissue interface and patient positioning. Comparison Study: No prior studies. Performing Technologist: Chanda Busing RVT  Examination Guidelines: A complete evaluation includes B-mode imaging, spectral Doppler, color Doppler, and power Doppler as  needed of all accessible portions of each vessel. Bilateral testing is considered an integral part of a complete examination. Limited examinations for reoccurring indications may be performed as noted. The reflux portion of the exam is performed with the patient in reverse Trendelenburg.  +-----+---------------+---------+-----------+----------+--------------+ RIGHTCompressibilityPhasicitySpontaneityPropertiesThrombus Aging +-----+---------------+---------+-----------+----------+--------------+ CFV  Full           Yes      Yes                                 +-----+---------------+---------+-----------+----------+--------------+   +---------+---------------+---------+-----------+----------+-------------------+ LEFT     CompressibilityPhasicitySpontaneityPropertiesThrombus Aging      +---------+---------------+---------+-----------+----------+-------------------+ CFV      Full           Yes      Yes                                      +---------+---------------+---------+-----------+----------+-------------------+ SFJ      Full                                                             +---------+---------------+---------+-----------+----------+-------------------+ FV Prox  Full                                                             +---------+---------------+---------+-----------+----------+-------------------+ FV Mid   Full           Yes      Yes                                      +---------+---------------+---------+-----------+----------+-------------------+ FV Distal               Yes  Yes                                      +---------+---------------+---------+-----------+----------+-------------------+ POP      Full           Yes      Yes                                      +---------+---------------+---------+-----------+----------+-------------------+ PTV                                                   Patency shown with                                                         color doppler       +---------+---------------+---------+-----------+----------+-------------------+ PERO                                                  Patency shown with                                                        color doppler       +---------+---------------+---------+-----------+----------+-------------------+     Summary: RIGHT: - No evidence of common femoral vein obstruction.  LEFT: - There is no evidence of deep vein thrombosis in the lower extremity. However, portions of this examination were limited- see technologist comments above.  - No cystic structure found in the popliteal fossa.  *See table(s) above for measurements and observations. Electronically signed by Fabienne Bruns MD on 10/08/2019 at 7:02:07 PM.    Final     Review of Systems Blood pressure (!) 147/78, pulse 93, temperature 98 F (36.7 C), temperature source Oral, resp. rate 17, height 6\' 2"  (1.88 m), weight (!) 174.3 kg, SpO2 99 %. Physical Exam General: AAO x3, NAD  Dermatological: Necrotic, fibrotic ulceration present on the plantar lateral aspect the left midfoot which is worsened from the last saw him and there is a blister surrounding this area extending to the midfoot.  There is tenderness palpation.  Is no probing to bone but not fully able to evaluate the wound given the discomfort.  There is currently no purulence but a small amount of clear drainage is identified.  Chronic venous insufficiency is present bilaterally with lower extremity edema.    Vascular: Vascular status decrease.  Lower extremity edema present.  Neruologic: Grossly intact via light touch bilateral. Vibratory intact via tuning fork bilateral. Protective threshold with Semmes Wienstein monofilament intact to all pedal sites bilateral. Patellar and Achilles deep tendon reflexes 2+ bilateral. No Babinski or clonus noted bilateral.    Assessment/Plan: Ulceration,  abscess left foot; PAD  CT scan did  not show any evidence of osteomyelitis but there was subcu soft tissue edema as well as soft tissue gas likely from the ulcer.  However clinically the wound is worsened and there is blister formation to the plantar aspect of the foot.  Given the worsening the ulceration I recommended debridement of the wound.  We discussed the pros and cons of doing this but in order to help prevent further spread of infection, limb loss debridement of the wound is warranted at this point.  We discussed the surgery as well as potential need for repeat debridement/graft application.  We will plan on doing this tomorrow.  We discussed risks of surgery including spread of infection, delayed or nonhealing, amputation.  Plan for angio with vascular surgery this week as well  Continue IV robotics.  Can stop heparin drip on-call to the OR.  N.p.o. after midnight   Trula Slade 10/09/2019, 1:13 PM  C: [phone] O: [phone]

## 2019-10-09 NOTE — Progress Notes (Addendum)
PROGRESS NOTE    Maurice Nguyen  JJO:[id number] DOB: 11/01/1965 DOA: 10/08/2019 PCP: Bernerd Limbo, MD     Brief Narrative:  Maurice Nguyen is a 54 yo AA male with past medical history significant for uncontrolled DM type 2, HTN, chronic systolic HF EF 16-01%, paroxysmal A fib who presents with worsening wound infection on plantar surface of left foot. He has been followed by Dr. [name] of Podiatry, was scheduled also to consult with vascular surgery Dr. [name] on 3/18. He states that due to pain, he has not been taking his diuretic so he would not have to go to the bathroom as often. Now presenting with worsening peripheral edema. CT left foot did not reveal focal fluid collection or abscess, no CT findings of osteo. He was found to be in A Fib RVR, cardizem gtt started and given IV lasix.   New events last 24 hours / Subjective: Admits to continued left foot pain and peripheral edema.  Denies any chest pain or shortness of breath.  Assessment & Plan:   Principal Problem:   Atrial fibrillation with RVR Active Problems:   Diabetes mellitus type 2 with complications, uncontrolled (HCC)   Essential hypertension   AKI (acute kidney injury) ([place])   Diabetic ulcer of left foot (HCC)   Acute on chronic combined systolic (congestive) and diastolic (congestive) heart failure (HCC)    Diabetic foot wound with peripheral artery disease -CRP 12, sed rate 53 -CT left foot showed Skin defect about the plantar aspect of the midfoot subjacent to the fifth metatarsal. Patchy soft tissue air remains localized to the superficial soft tissues of the plantar foot. This is likely related ulcer, however recommend clinical correlation for gangrene. No focal fluid collection or abscess. No radiopaque foreign body. No CT findings of osteomyelitis. -Consult Dr. [name], podiatry -Consult vascular surgery.  He had ABI completed as an outpatient on 3/3 which revealed diminished flow to the left foot.  He  has an outpatient appointment with Dr. [name] that was scheduled for 3/18 -Augmentin --> Vanco/zosyn  -Addendum: Discussed with Dr. [name], patient to be scheduled for wound debridement 3/17  A. fib RVR -Remains on Cardizem drip, heparin drip.  He was previously on Xarelto -Addendum: HR better this afternoon, switch cardizem drip to PO   Acute on chronic combined systolic and diastolic heart failure -Previous echocardiogram in 2016 showed EF 30 to 35% -Patient given Lasix in the emergency department -Strict I/Os, daily weight  -Lasix IV BID  -Addendum: repeat echo showing EF 30-35% with global hypokinesis. Will consult cardiology to assist with CHF management   Type 2 diabetes, insulin-dependent, uncontrolled with hyperglycemia -Hemoglobin A1c 11.2 -Continue insulin NPH, sliding scale insulin -Diabetic coordinator consulted  AKI -Presented with creatinine 1.45, AKI resolved today creatinine 1.18  OSA -CPAP qhs     DVT prophylaxis: Heparin gtt Code Status: Full Family Communication: None at bedside Disposition Plan:  . Patient is from home prior to admission. . Currently in-hospital treatment needed due to DM foot wound, CHF, A Fib requiring IV medications. Pending consulting services  . Suspect patient will discharge home vs SNF in several days.    Consultants:   Podiatry  Vascular surgery  Cardiology     Antimicrobials:  Anti-infectives (From admission, onward)   Start     Dose/Rate Route Frequency Ordered Stop   10/09/19 1000  amoxicillin-clavulanate (AUGMENTIN) 875-125 MG per tablet 1 tablet     1 tablet Oral Every 12 hours 10/08/19 2155  10/08/19 2145  amoxicillin-clavulanate (AUGMENTIN) 875-125 MG per tablet 1 tablet     1 tablet Oral  Once 10/08/19 2130 10/08/19 2247        Objective: Vitals:   10/09/19 0715 10/09/19 0730 10/09/19 0745 10/09/19 0845  BP: 128/78 112/72 (!) 143/96 (!) 145/68  Pulse: 98 79 84 93  Resp: 18 20 20 17   Temp:        TempSrc:      SpO2: 97% 93% 97% 99%  Weight:      Height:        Intake/Output Summary (Last 24 hours) at 10/09/2019 0935 Last data filed at 10/09/2019 0054 Gross per 24 hour  Intake --  Output 2250 ml  Net -2250 ml   Filed Weights   10/08/19 1502  Weight: (!) 195 kg    Examination:  General exam: Appears calm and comfortable  Respiratory system: Clear to auscultation anteriorly. Respiratory effort normal. No respiratory distress. No conversational dyspnea.  Cardiovascular system: S1 & S2 heard, irreg rhythm, rate 90s. No murmurs. +Bilateral pedal edema. Gastrointestinal system: Abdomen is nondistended, soft and nontender. Normal bowel sounds heard. Central nervous system: Alert and oriented. No focal neurological deficits. Speech clear.  Extremities: +Bilateral peripheral edema  Skin:      Psychiatry: Judgement and insight appear normal. Mood & affect appropriate.   Data Reviewed: I have personally reviewed following labs and imaging studies  CBC: Recent Labs  Lab 10/08/19 1520 10/09/19 0312  WBC 9.3 11.6*  NEUTROABS 6.4  --   HGB 12.9* 13.1  HCT 40.9 40.3  MCV 97.8 96.6  PLT 203 267   Basic Metabolic Panel: Recent Labs  Lab 10/08/19 1520 10/09/19 0312  NA 135 135  K 4.3 4.6  CL 98 97*  CO2 26 27  GLUCOSE 127* 104*  BUN 21* 18  CREATININE 1.45* 1.18  CALCIUM 8.6* 8.4*   GFR: Estimated Creatinine Clearance: 127.4 mL/min (by C-G formula based on SCr of 1.18 mg/dL). Liver Function Tests: Recent Labs  Lab 10/08/19 1520  AST 24  ALT 27  ALKPHOS 76  BILITOT 1.1  PROT 7.2  ALBUMIN 2.8*   No results for input(s): LIPASE, AMYLASE in the last 168 hours. No results for input(s): AMMONIA in the last 168 hours. Coagulation Profile: No results for input(s): INR, PROTIME in the last 168 hours. Cardiac Enzymes: No results for input(s): CKTOTAL, CKMB, CKMBINDEX, TROPONINI in the last 168 hours. BNP (last 3 results) No results for input(s): PROBNP in the  last 8760 hours. HbA1C: Recent Labs    10/08/19 2224  HGBA1C 11.2*   CBG: Recent Labs  Lab 10/09/19 0823  GLUCAP 121*   Lipid Profile: No results for input(s): CHOL, HDL, LDLCALC, TRIG, CHOLHDL, LDLDIRECT in the last 72 hours. Thyroid Function Tests: No results for input(s): TSH, T4TOTAL, FREET4, T3FREE, THYROIDAB in the last 72 hours. Anemia Panel: No results for input(s): VITAMINB12, FOLATE, FERRITIN, TIBC, IRON, RETICCTPCT in the last 72 hours. Sepsis Labs: Recent Labs  Lab 10/08/19 1520  LATICACIDVEN 1.5    Recent Results (from the past 240 hour(s))  SARS CORONAVIRUS 2 (TAT 6-24 HRS) Nasopharyngeal Nasopharyngeal Swab     Status: None   Collection Time: 10/08/19  6:08 PM   Specimen: Nasopharyngeal Swab  Result Value Ref Range Status   SARS Coronavirus 2 NEGATIVE NEGATIVE Final    Comment: (NOTE) SARS-CoV-2 target nucleic acids are NOT DETECTED. The SARS-CoV-2 RNA is generally detectable in upper and lower respiratory specimens during the acute  phase of infection. Negative results do not preclude SARS-CoV-2 infection, do not rule out co-infections with other pathogens, and should not be used as the sole basis for treatment or other patient management decisions. Negative results must be combined with clinical observations, patient history, and epidemiological information. The expected result is Negative. Fact Sheet for Patients: HairSlick.no Fact Sheet for Healthcare Providers: quierodirigir.com This test is not yet approved or cleared by the [place] FDA and  has been authorized for detection and/or diagnosis of SARS-CoV-2 by FDA under an Emergency Use Authorization (EUA). This EUA will remain  in effect (meaning this test can be used) for the duration of the COVID-19 declaration under Section 56 4(b)(1) of the Act, 21 U.S.C. section 360bbb-3(b)(1), unless the authorization is terminated or revoked  sooner. Performed at [facility], [address], [place], [place] [address]   Blood Cultures x 2 sites     Status: None (Preliminary result)   Collection Time: 10/08/19 10:24 PM   Specimen: BLOOD  Result Value Ref Range Status   Specimen Description BLOOD RIGHT UPPER ARM  Final   Special Requests   Final    BOTTLES DRAWN AEROBIC AND ANAEROBIC Blood Culture results may not be optimal due to an inadequate volume of blood received in culture bottles   Culture   Final    NO GROWTH < 12 HOURS Performed at [facility], [address], [place], [place] [address]    Report Status PENDING  Incomplete  Blood Cultures x 2 sites     Status: None (Preliminary result)   Collection Time: 10/08/19 10:45 PM   Specimen: BLOOD  Result Value Ref Range Status   Specimen Description BLOOD LEFT ARM  Final   Special Requests   Final    BOTTLES DRAWN AEROBIC AND ANAEROBIC Blood Culture results may not be optimal due to an inadequate volume of blood received in culture bottles   Culture   Final    NO GROWTH < 12 HOURS Performed at [facility], [address] [place], [place] [address]    Report Status PENDING  Incomplete  MRSA PCR Screening     Status: None   Collection Time: 10/09/19  3:12 AM   Specimen: Nasopharyngeal  Result Value Ref Range Status   MRSA by PCR NEGATIVE NEGATIVE Final    Comment:        The GeneXpert MRSA Assay (FDA approved for NASAL specimens only), is one component of a comprehensive MRSA colonization surveillance program. It is not intended to diagnose MRSA infection nor to guide or monitor treatment for MRSA infections. Performed at [facility], [address], [place], [place] [address]       Radiology Studies: DG Chest 2 View  Result Date: 10/08/2019 CLINICAL DATA:  54 year old male with history of shortness of breath. EXAM: CHEST - 2 VIEW COMPARISON:  Chest x-ray 10/01/2015. FINDINGS: There is cephalization of the pulmonary  vasculature and slight indistinctness of the interstitial markings suggestive of mild pulmonary edema. No definite pleural effusions. Mild cardiomegaly. Upper mediastinal contours are within normal limits. IMPRESSION: 1. The appearance of the chest suggests mild congestive heart failure, as above Electronically Signed   By: Trudie Reed M.D.   On: 10/08/2019 15:55   CT FOOT LEFT W CONTRAST  Result Date: 10/08/2019 CLINICAL DATA:  Lower extremity abscess. EXAM: CT OF THE LOWER LEFT EXTREMITY WITH CONTRAST TECHNIQUE: Multidetector CT imaging of the lower left extremity was performed according  to the standard protocol following intravenous contrast administration. COMPARISON:  Foot radiograph earlier today. CONTRAST:  100 cc Omnipaque 300 IV FINDINGS: Bones/Joint/Cartilage No bony destruction or periosteal reaction. No fracture or dislocation. Suggestion of pes planus. No ankle joint effusion. Ligaments Suboptimally assessed by CT. Muscles and Tendons No intramuscular fluid collection. There is fatty atrophy of the intrinsic musculature of the foot. Soft tissues Skin defect about the plantar aspect of the midfoot subjacent to the fifth metatarsal. Patchy soft tissue air in the subcutaneous tissues of the foot remains localized to the superficial compartment. There is associated skin thickening. No focal fluid collection or abscess. No radiopaque foreign body. Prominent generalized subcutaneous soft tissue edema is noted. Age advanced vascular calcifications. IMPRESSION: 1. Skin defect about the plantar aspect of the midfoot subjacent to the fifth metatarsal. Patchy soft tissue air remains localized to the superficial soft tissues of the plantar foot. This is likely related ulcer, however recommend clinical correlation for gangrene. No focal fluid collection or abscess. No radiopaque foreign body. No CT findings of osteomyelitis. 2. Prominent generalized subcutaneous soft tissue edema about the foot. 3. Age  advanced vascular calcifications. Electronically Signed   By: Narda Rutherford M.D.   On: 10/08/2019 21:06   DG Foot Complete Left  Result Date: 10/08/2019 CLINICAL DATA:  Short of breath EXAM: LEFT FOOT - COMPLETE 3+ VIEW COMPARISON:  09/03/2019 FINDINGS: Pes planus. Negative for fracture or arthropathy.  Arterial calcification. IMPRESSION: No acute abnormality Electronically Signed   By: Marlan Palau M.D.   On: 10/08/2019 15:55   VAS [place] LOWER EXTREMITY VENOUS (DVT) (ONLY [facility] & [facility])  Result Date: 10/08/2019  Lower Venous DVT Study Indications: Swelling.  Risk Factors: None identified. Limitations: Body habitus, poor ultrasound/tissue interface and patient positioning. Comparison Study: No prior studies. Performing Technologist: Chanda Busing RVT  Examination Guidelines: A complete evaluation includes B-mode imaging, spectral Doppler, color Doppler, and power Doppler as needed of all accessible portions of each vessel. Bilateral testing is considered an integral part of a complete examination. Limited examinations for reoccurring indications may be performed as noted. The reflux portion of the exam is performed with the patient in reverse Trendelenburg.  +-----+---------------+---------+-----------+----------+--------------+ RIGHTCompressibilityPhasicitySpontaneityPropertiesThrombus Aging +-----+---------------+---------+-----------+----------+--------------+ CFV  Full           Yes      Yes                                 +-----+---------------+---------+-----------+----------+--------------+   +---------+---------------+---------+-----------+----------+-------------------+ LEFT     CompressibilityPhasicitySpontaneityPropertiesThrombus Aging      +---------+---------------+---------+-----------+----------+-------------------+ CFV      Full           Yes      Yes                                      +---------+---------------+---------+-----------+----------+-------------------+  SFJ      Full                                                             +---------+---------------+---------+-----------+----------+-------------------+ FV Prox  Full                                                             +---------+---------------+---------+-----------+----------+-------------------+  FV Mid   Full           Yes      Yes                                      +---------+---------------+---------+-----------+----------+-------------------+ FV Distal               Yes      Yes                                      +---------+---------------+---------+-----------+----------+-------------------+ POP      Full           Yes      Yes                                      +---------+---------------+---------+-----------+----------+-------------------+ PTV                                                   Patency shown with                                                        color doppler       +---------+---------------+---------+-----------+----------+-------------------+ PERO                                                  Patency shown with                                                        color doppler       +---------+---------------+---------+-----------+----------+-------------------+     Summary: RIGHT: - No evidence of common femoral vein obstruction.  LEFT: - There is no evidence of deep vein thrombosis in the lower extremity. However, portions of this examination were limited- see technologist comments above.  - No cystic structure found in the popliteal fossa.  *See table(s) above for measurements and observations. Electronically signed by Fabienne Bruns MD on 10/08/2019 at 7:02:07 PM.    Final       Scheduled Meds: . amoxicillin-clavulanate  1 tablet Oral Q12H  . insulin aspart  0-20 Units Subcutaneous TID WC  . insulin NPH Human  40 Units Subcutaneous BID AC & HS  . sodium chloride flush  3 mL Intravenous Q12H    . torsemide  40 mg Oral Daily   Continuous Infusions: . sodium chloride    . diltiazem (CARDIZEM) infusion 12.5 mg/hr (10/09/19 0833)  . heparin 2,350 Units/hr (10/09/19 7616)     LOS: 0 days      Time spent: 50 minutes   Noralee Stain, DO [facility] 10/09/2019, 9:35 AM  Available via Epic secure chat 7am-7pm After these hours, please refer to coverage provider listed on [email or web address]

## 2019-10-09 NOTE — Progress Notes (Signed)
Pt with loss of iv access infusing his heparin. Paged provider [place]. Pt to get an order for a midline so that he can get the rest of his medications (cardizem, antibiotics, and heparin) infused. Will CTM pt.

## 2019-10-09 NOTE — Progress Notes (Signed)
Pt refusing to sigh consent and wishes to speak to MD about risks assioated with procedure.

## 2019-10-09 NOTE — Progress Notes (Signed)
  Echocardiogram 2D Echocardiogram has been performed.  Burnard Hawthorne 10/09/2019, 3:24 PM

## 2019-10-09 NOTE — Progress Notes (Signed)
Initial Nutrition Assessment  DOCUMENTATION CODES:   Morbid obesity  INTERVENTION:   -Double protein portions with meals -MVI with minerals daily -Ensure Max po BID, each supplement provides 150 kcal and 30 grams of protein.   NUTRITION DIAGNOSIS:   Increased nutrient needs related to wound healing as evidenced by estimated needs.  GOAL:   Patient will meet greater than or equal to 90% of their needs  MONITOR:   PO intake, Supplement acceptance, Diet advancement, Labs, Weight trends, Skin, I & O's  REASON FOR ASSESSMENT:   Consult Wound healing  ASSESSMENT:   Maurice Nguyen is a 54 yo AA male with past medical history significant for uncontrolled DM type 2, HTN, chronic systolic HF EF 30-35%, paroxysmal A fib who presents with worsening wound infection on plantar surface of left foot. He has been followed by Dr. Ardelle Anton of Podiatry, was scheduled also to consult with vascular surgery Dr. Darrick Penna on 3/18. He states that due to pain, he has not been taking his diuretic so he would not have to go to the bathroom as often. Now presenting with worsening peripheral edema. CT left foot did not reveal focal fluid collection or abscess, no CT findings of osteo. He was found to be in A Fib RVR, cardizem gtt started and given IV lasix.  Pt admitted with CHF and a-fib with RVR.   Reviewed I/O's: -2.3 L x 24 hours  UOP: 2.3 L x 24 hours   Per [facility] note on 10/09/19, pt with necrotic wound to lt plantar foot. Recommending podiatry or orthopedic consult.   Per MD notes, ABI completed as an outpatient on 09/26/19 which revealed diminished flow to the left.   Spoke with pt at bedside, who was not very interactive with this RD and mainly responded to close ended questions. He reports good appetite, consumed 100% of breakfast. Pt shares that his intake was sporadic PTA, due to limited mobility from foot wound. Per H&P, pt also stopped taking his diuretics PTA for this reason. He is unsure if  he has lost weight. Suspect edema is masking true weight loss as well as potential fat and muscle depletions.  Medications reviewed and include demadex and vancomycin.   Lab Results  Component Value Date   HGBA1C 11.2 (H) 10/08/2019   PTA DM medications are 1000 mg metfomrin BID and 60 units insulin isophane and regular human BID.   Labs reviewed: CBGS: 121-139 (inpatient orders for glycemic control are 0-20 units insulin aspart TID with meals and 40 units insulin NPH BID).   NUTRITION - FOCUSED PHYSICAL EXAM:    Most Recent Value  Orbital Region  No depletion  Upper Arm Region  No depletion  Thoracic and Lumbar Region  No depletion  Buccal Region  No depletion  Temple Region  No depletion  Clavicle Bone Region  No depletion  Clavicle and Acromion Bone Region  No depletion  Scapular Bone Region  No depletion  Dorsal Hand  No depletion  Patellar Region  No depletion  Anterior Thigh Region  No depletion  Posterior Calf Region  No depletion  Edema (RD Assessment)  Moderate  Hair  Reviewed  Eyes  Reviewed  Mouth  Reviewed  Skin  Reviewed  Nails  Reviewed       Diet Order:   Diet Order            Diet Carb Modified Fluid consistency: Thin; Room service appropriate? Yes  Diet effective now  EDUCATION NEEDS:   No education needs have been identified at this time  Skin:  Skin Assessment: Skin Integrity Issues: Skin Integrity Issues:: Other (Comment) Other: necrotic wound to lt plantar foot  Last BM:  Unknown  Height:   Ht Readings from Last 1 Encounters:  10/09/19 6\' 2"  (1.88 m)    Weight:   Wt Readings from Last 1 Encounters:  10/09/19 (!) 174.3 kg    Ideal Body Weight:  86.4 kg  BMI:  Body mass index is 49.34 kg/m.  Estimated Nutritional Needs:   Kcal:  2400-2600  Protein:  135-150 grams  Fluid:  > 2.4 L    Loistine Chance, RD, LDN, [place] Registered Dietitian II Certified Diabetes Care and Education Specialist Please refer to [facility]  for RD and/or RD on-call/weekend/after hours pager

## 2019-10-09 NOTE — Progress Notes (Signed)
Inpatient Diabetes Program Recommendations  AACE/ADA: New Consensus Statement on Inpatient Glycemic Control (2015)  Target Ranges:  Prepandial:   less than 140 mg/dL      Peak postprandial:   less than 180 mg/dL (1-2 hours)      Critically ill patients:  140 - 180 mg/dL   Lab Results  Component Value Date   GLUCAP 139 (H) 10/09/2019   HGBA1C 11.2 (H) 10/08/2019    Review of Glycemic Control Results for Maurice Nguyen, Maurice Nguyen (MRN [id number]) as of 10/09/2019 12:01  Ref. Range 10/09/2019 08:23 10/09/2019 11:34  Glucose-Capillary Latest Ref Range: 70 - 99 mg/dL 409 (H) 811 (H)   Diabetes history: DM 2 Outpatient Diabetes medications: 70/30 60 units bid, Metformin 500 mg bid Current orders for Inpatient glycemic control:  NPH 40 units bid, Novolog resistant tid with meals  Inpatient Diabetes Program Recommendations:    Note admit for DFU.  Patient saw his Endocrinologist on 3/11 and was restarted on insulin and metformin.  Blood sugars seem to be improved.  Will follow.  A1C was addressed by endocrinologist.    Thanks,  Beryl Meager, RN, BC-ADM Inpatient Diabetes Coordinator Pager [phone] (8a-5p)

## 2019-10-09 NOTE — Consult Note (Addendum)
Cardiology Consultation:   Patient ID: Maurice Nguyen MRN: [id number]; DOB: 08-09-1965  Admit date: 10/08/2019 Date of Consult: 10/09/2019  Primary Care Provider: Tracey Harries, MD Primary Cardiologist: Kristeen Miss, MD  Primary Electrophysiologist:  None    Patient Profile:   Maurice Nguyen is a 54 y.o. 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 Alvino Chapel.  History of Present Illness:   [name] was first seen in consultation back in 2014 when he was diagnosed with Afib and placed on coumadin. Echo at that time showed EF 55-60% , mildly dilated LA and RA. He was seen again during an admission in 2014 for Afib RVR and acute HF. He had run out of his lasix. He was started on BB and CCB and changed to Xarelto. Echo in March 2015 showed EF 45-50%, moderate hypertrophy, mild MR. He was seen in 08/2014 and reported noncompliance with his medications. CCB and ACE were discontinued and was started on Coreg and valsartan. He reportedly stopped Xarelto on his own for rectal bleeding but later restarted it. He was changed from lasix to Torsemide 40 mg daily given low urine output. Echo in 01/2015 showed EF 30-35%, mod LVH, LA and RA mildly dilated. Appears no follow-up ischemic eval was performed. He was last seen 09/2017 noted to have lower leg swelling and unna boots. Pressures were stable and was taking his meds and trying to loose weight.    The patient presented to the ED 10/08/19 for wound infection. He has h/o of diabetic L foot ulcer which is followed by podiatry. Patient was having severe pain in his foot on ambulation. He stopped his diuretic 2 weeks ago so he didn't have to walk to the bathroom. Since then he noted increased bilateral lower leg swelling and increased  shortness of breath. No chest pain. Also noted orthopnea. No fever, chills, recent illness.   In the ED BP 150/92, pulse 131. Afebrile, RR 33, 96% O2.  CXR with mild CHF. Creatinine 1.45 (baseline 1). Glucose 127, Hgb 12.9. EKG showed Afib RVR, 126 bpm started on IV cardizem drip and IV heparin (since he stopped taking his Xarelto). He was given abx, Lasix IV 40 and insulin in the ED. He was admitted for further work-up.   Patient lives alone but has family come in to help take care of him since he is unable to perform ADLs. He denies tobacco/alcohol/drug use. He said he stopped taking his Xarelto about 1-2 years ago along with his other blood pressure medications. HE has been taking his torsemide daily for volume management.    Past Medical History:  Diagnosis Date  . A-fib (HCC)   . Anticoagulated, on Xarelto, Chads2Vas score 2 10/02/2013  . Back pain, chronic 07/13/2012  . CHF (congestive heart failure) (HCC)   . 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 CKD (chronic kidney disease), stage II   . Complication of anesthesia    " THEY LOST ME AND BROUGHT ME BACK "  . Hypertension 07/13/2012  . Irregular heart beat   . Lumbar herniated disc   . Morbid obesity (HCC)   . Obesity, morbid, BMI 50 or higher (HCC) 07/13/2012  . 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 Renal insufficiency 10/08/2019  .  Shortness of breath   . Type II diabetes mellitus (HCC) 07/13/2012    Past Surgical History:  Procedure Laterality Date  . ABSCESS DRAINAGE    . COLONOSCOPY  08/22/2012   Procedure: COLONOSCOPY;  Surgeon: Shirley Friar, MD;  Location: [facility];  Service: Endoscopy;  Laterality: N/A;  . DENTAL SURGERY       Home Medications:  Prior to Admission medications   Medication Sig Start Date End Date Taking? Authorizing Provider  collagenase (SANTYL) ointment Apply 1 application topically daily. 09/27/19  Yes Vivi Barrack, DPM  insulin isophane & regular human (NOVOLIN  70/30 FLEXPEN) (70-30) 100 UNIT/ML KwikPen Inject 60 Units into the skin in the morning and at bedtime.   Yes [provider]  metFORMIN (GLUCOPHAGE-XR) 500 MG 24 hr tablet Take 1,000 mg by mouth in the morning and at bedtime.   Yes [provider]  naproxen sodium (ALEVE) 220 MG tablet Take 220-440 mg by mouth 2 (two) times daily as needed (for pain or headaches).   Yes [provider]  nitroGLYCERIN (NITROSTAT) 0.4 MG SL tablet Place 1 tablet (0.4 mg total) under the tongue every 5 (five) minutes x 3 doses as needed for chest pain. 02/03/15  Yes [name], MD  oxyCODONE-acetaminophen (PERCOCET/ROXICET) 5-325 MG tablet Take 1 tablet by mouth every 6 (six) hours as needed for moderate pain. 10/04/15  Yes Osvaldo Shipper, MD  torsemide (DEMADEX) 20 MG tablet TAKE 2 TABLETS BY MOUTH ONCE DAILY Patient taking differently: Take 40 mg by mouth daily.  05/21/16  Yes Nahser, Deloris Ping, MD  ammonium lactate (AMLACTIN) 12 % cream Apply topically as needed for dry skin. Patient not taking: Reported on 10/08/2019 03/23/19   Vivi Barrack, DPM  BD PEN NEEDLE NANO 2ND GEN 32G X 4 MM MISC in the morning, at noon, and at bedtime.  03/07/19   [provider]    Inpatient Medications: Scheduled Meds: . diltiazem  60 mg Oral Q6H  . furosemide  40 mg Intravenous BID  . heparin  4,000 Units Intravenous Once  . insulin aspart  0-20 Units Subcutaneous TID WC  . insulin NPH Human  40 Units Subcutaneous BID AC & HS  . multivitamin with minerals  1 tablet Oral Daily  . Ensure Max Protein  11 oz Oral BID  . sodium chloride flush  10-40 mL Intracatheter Q12H  . sodium chloride flush  3 mL Intravenous Q12H   Continuous Infusions: . sodium chloride    . heparin 2,350 Units/hr (10/09/19 1323)  . piperacillin-tazobactam (ZOSYN)  IV 3.375 g (10/09/19 1323)  . vancomycin 2,500 mg (10/09/19 1634)  . vancomycin     PRN Meds: sodium chloride, acetaminophen, ondansetron (ZOFRAN)  IV, oxyCODONE-acetaminophen, sodium chloride flush, sodium chloride flush  Allergies:   No Known Allergies  Social History:   Social History   Socioeconomic History  . Marital status: Single    Spouse name: Not on file  . Number of children: Not on file  . Years of education: Not on file  . Highest education level: Not on file  Occupational History  . Occupation: N/A  Tobacco Use  . Smoking status: Never Smoker  . Smokeless tobacco: Never Used  Substance and Sexual Activity  . Alcohol use: No    Comment: rarely  . Drug use: No  . Sexual activity: Yes  Other Topics Concern  . Not on file  Social History Narrative   Denies caffeine use    Social Determinants  of Health   Financial Resource Strain:   . Difficulty of Paying Living Expenses:   Food Insecurity:   . Worried About Programme researcher, broadcasting/film/video in the Last Year:   . Barista in the Last Year:   Transportation Needs:   . Freight forwarder (Medical):   Marland Kitchen Lack of Transportation (Non-Medical):   Physical Activity:   . Days of Exercise per Week:   . Minutes of Exercise per Session:   Stress:   . Feeling of Stress :   Social Connections:   . Frequency of Communication with Friends and Family:   . Frequency of Social Gatherings with Friends and Family:   . Attends Religious Services:   . Active Member of Clubs or Organizations:   . Attends Banker Meetings:   Marland Kitchen Marital Status:   Intimate Partner Violence:   . Fear of Current or Ex-Partner:   . Emotionally Abused:   Marland Kitchen Physically Abused:   . Sexually Abused:     Family History:   * Family History  Problem Relation Age of Onset  . Heart disease Neg Hx      ROS:  Please see the history of present illness.  All other ROS reviewed and negative.     Physical Exam/Data:   Vitals:   10/09/19 0745 10/09/19 0845 10/09/19 1001 10/09/19 1338  BP: (!) 143/96 (!) 145/68 (!) 147/78 (!) 150/82  Pulse: 84 93  94  Resp: Temp:   98 F  (36.7 C)   TempSrc:   Oral   SpO2: 97% 99%    Weight:   (!) 174.3 kg   Height:    (1.88 m)     Intake/Output Summary (Last 24 hours) at 10/09/2019 1722 Last data filed at 10/09/2019 1345 Gross per 24 hour  Intake 227 ml  Output 5070 ml  Net -4843 ml   Last 3 Weights 10/09/2019 10/08/2019 10/07/2017  Weight (lbs) 384 lb 4.2 oz 430 lb 431 lb  Weight (kg) 174.3 kg 195.047 kg 195.5 kg     Body mass index is 49.34 kg/m.  General:  Obese AAM in no acute distress HEENT: normal Lymph: no adenopathy Neck: no JVD Endocrine:  No thryomegaly Vascular: No carotid bruits; decreased pedal pulses Cardiac:  normal S1, S2; RRR; no murmur  Lungs:  Crackles at bases  Abd: soft, nontender, no hepatomegaly  Ext: 2+ LL edema Musculoskeletal:  No deformities, BUE and BLE strength normal and equal Skin: warm and dry  Neuro:  CNs 2-12 intact, no focal abnormalities noted Psych:  Normal affect   EKG:  The EKG was personally reviewed and demonstrates:  Afib RVR, 126 bpm, PVCs Telemetry:  Telemetry was personally reviewed and demonstrates:  Afib with rates 80-90s. PVCs, 6 beast NSVT  Relevant CV Studies:  Echo 10/09/19 1. Left ventricular ejection fraction, by estimation, is 30 to 35%. The  left ventricle has moderately decreased function. The left ventricle  demonstrates global hypokinesis. The left ventricular internal cavity size  was mildly dilated. Left ventricular  diastolic parameters are indeterminate.  2. Right ventricular systolic function was not well visualized. The right  ventricular size is not well visualized.  3. Left atrial size was moderately dilated.  4. The mitral valve is abnormal. Trivial mitral valve regurgitation.  5. The aortic valve is tricuspid. Aortic valve regurgitation is not  visualized.  6. Aortic dilatation noted. There is mild dilatation of the ascending  aorta measuring  39 mm.  7. The inferior vena cava is dilated in size with <50% respiratory    variability, suggesting right atrial pressure of 15 mmHg.    Laboratory Data:  High Sensitivity Troponin:  No results for input(s): TROPONINIHS in the last 720 hours.   Chemistry Recent Labs  Lab 10/08/19 1520 10/09/19 0312  NA 135 135  K 4.3 4.6  CL 98 97*  CO2 26 27  GLUCOSE 127* 104*  BUN 21* 18  CREATININE 1.45* 1.18  CALCIUM 8.6* 8.4*  GFRNONAA 54* >60  GFRAA >60 >60  ANIONGAP 11 11    Recent Labs  Lab 10/08/19 1520  PROT 7.2  ALBUMIN 2.8*  AST 24  ALT 27  ALKPHOS 76  BILITOT 1.1   Hematology Recent Labs  Lab 10/08/19 1520 10/09/19 0312  WBC 9.3 11.6*  RBC 4.18* 4.17*  HGB 12.9* 13.1  HCT 40.9 40.3  MCV 97.8 96.6  MCH 30.9 31.4  MCHC 31.5 32.5  RDW 13.4 13.5  PLT 203 267   BNPNo results for input(s): BNP, PROBNP in the last 168 hours.  DDimer No results for input(s): DDIMER in the last 168 hours.   Radiology/Studies:  DG Chest 2 View  Result Date: 10/08/2019 CLINICAL DATA:  54 year old male with history of shortness of breath. EXAM: CHEST - 2 VIEW COMPARISON:  Chest x-ray 10/01/2015. FINDINGS: There is cephalization of the pulmonary vasculature and slight indistinctness of the interstitial markings suggestive of mild pulmonary edema. No definite pleural effusions. Mild cardiomegaly. Upper mediastinal contours are within normal limits. IMPRESSION: 1. The appearance of the chest suggests mild congestive heart failure, as above Electronically Signed   By: Vinnie Langton M.D.   On: 10/08/2019 15:55   CT FOOT LEFT W CONTRAST  Result Date: 10/08/2019 CLINICAL DATA:  Lower extremity abscess. EXAM: CT OF THE LOWER LEFT EXTREMITY WITH CONTRAST TECHNIQUE: Multidetector CT imaging of the lower left extremity was performed according to the standard protocol following intravenous contrast administration. COMPARISON:  Foot radiograph earlier today. CONTRAST:  100 cc Omnipaque 300 IV FINDINGS: Bones/Joint/Cartilage No bony destruction or periosteal reaction. No  fracture or dislocation. Suggestion of pes planus. No ankle joint effusion. Ligaments Suboptimally assessed by CT. Muscles and Tendons No intramuscular fluid collection. There is fatty atrophy of the intrinsic musculature of the foot. Soft tissues Skin defect about the plantar aspect of the midfoot subjacent to the fifth metatarsal. Patchy soft tissue air in the subcutaneous tissues of the foot remains localized to the superficial compartment. There is associated skin thickening. No focal fluid collection or abscess. No radiopaque foreign body. Prominent generalized subcutaneous soft tissue edema is noted. Age advanced vascular calcifications. IMPRESSION: 1. Skin defect about the plantar aspect of the midfoot subjacent to the fifth metatarsal. Patchy soft tissue air remains localized to the superficial soft tissues of the plantar foot. This is likely related ulcer, however recommend clinical correlation for gangrene. No focal fluid collection or abscess. No radiopaque foreign body. No CT findings of osteomyelitis. 2. Prominent generalized subcutaneous soft tissue edema about the foot. 3. Age advanced vascular calcifications. Electronically Signed   By: Keith Rake M.D.   On: 10/08/2019 21:06   DG Foot Complete Left  Result Date: 10/08/2019 CLINICAL DATA:  Short of breath EXAM: LEFT FOOT - COMPLETE 3+ VIEW COMPARISON:  09/03/2019 FINDINGS: Pes planus. Negative for fracture or arthropathy.  Arterial calcification. IMPRESSION: No acute abnormality Electronically Signed   By: Franchot Gallo M.D.   On: 10/08/2019 15:55  ECHOCARDIOGRAM COMPLETE  Result Date: 10/09/2019    ECHOCARDIOGRAM REPORT   Patient Name:   VI WHITESEL Date of Exam: 10/09/2019 Medical Rec #:  [id number]          Height:       74.0 in Accession #:    [id number]         Weight:       384.3 lb Date of Birth:  1965-10-04           BSA:          2.869 m Patient Age:    53 years           BP:           112/72 mmHg Patient Gender: M                   HR:           94 bpm. Exam Location:  Inpatient Procedure: 2D Echo, Cardiac Doppler and Color Doppler Indications:    Atrial Fibrillation 427.31  History:        Patient has prior history of Echocardiogram examinations, most                 recent 01/31/2015. Arrythmias:Atrial Fibrillation; Risk                 Factors:Hypertension, Diabetes, Dyslipidemia and Non-Smoker.  Sonographer:    Renella Cunas RDCS Referring Phys: [phone] JARED M GARDNER  Sonographer Comments: Patient is morbidly obese and suboptimal apical window. Image acquisition challenging due to uncooperative patient. Patient refused definity. IMPRESSIONS  1. Left ventricular ejection fraction, by estimation, is 30 to 35%. The left ventricle has moderately decreased function. The left ventricle demonstrates global hypokinesis. The left ventricular internal cavity size was mildly dilated. Left ventricular diastolic parameters are indeterminate.  2. Right ventricular systolic function was not well visualized. The right ventricular size is not well visualized.  3. Left atrial size was moderately dilated.  4. The mitral valve is abnormal. Trivial mitral valve regurgitation.  5. The aortic valve is tricuspid. Aortic valve regurgitation is not visualized.  6. Aortic dilatation noted. There is mild dilatation of the ascending aorta measuring 39 mm.  7. The inferior vena cava is dilated in size with <50% respiratory variability, suggesting right atrial pressure of 15 mmHg. FINDINGS  Left Ventricle: Left ventricular ejection fraction, by estimation, is 30 to 35%. The left ventricle has moderately decreased function. The left ventricle demonstrates global hypokinesis. The left ventricular internal cavity size was mildly dilated. There is no left ventricular hypertrophy. Left ventricular diastolic parameters are indeterminate. Right Ventricle: The right ventricular size is not well visualized. Right vetricular wall thickness was not assessed. Right ventricular  systolic function was not well visualized. Left Atrium: Left atrial size was moderately dilated. Right Atrium: Right atrial size was normal in size. Pericardium: There is no evidence of pericardial effusion. Mitral Valve: The mitral valve is abnormal. Mild mitral annular calcification. Trivial mitral valve regurgitation. Tricuspid Valve: The tricuspid valve is not well visualized. Tricuspid valve regurgitation is trivial. Aortic Valve: The aortic valve is tricuspid. Aortic valve regurgitation is not visualized. Pulmonic Valve: The pulmonic valve was normal in structure. Pulmonic valve regurgitation is not visualized. Aorta: Aortic dilatation noted. There is mild dilatation of the ascending aorta measuring 39 mm. Venous: The inferior vena cava is dilated in size with less than 50% respiratory variability, suggesting right atrial pressure of 15 mmHg. IAS/Shunts: The interatrial septum was  not well visualized.  LEFT VENTRICLE PLAX 2D LVIDd:         5.79 cm LVIDs:         4.88 cm LV PW:         0.95 cm LV IVS:        0.95 cm LVOT diam:     2.60 cm LV SV:         70 LV SV Index:   24 LVOT Area:     5.31 cm  RIGHT VENTRICLE TAPSE (M-mode): 1.4 cm LEFT ATRIUM              Index       RIGHT ATRIUM           Index LA diam:        5.10 cm  1.78 cm/m  RA Area:     26.80 cm LA Vol (A2C):   94.6 ml  32.98 ml/m RA Volume:   92.80 ml  32.35 ml/m LA Vol (A4C):   109.0 ml 38.00 ml/m LA Biplane Vol: 106.0 ml 36.95 ml/m  AORTIC VALVE LVOT Vmax:   77.00 cm/s LVOT Vmean:  50.700 cm/s LVOT VTI:    0.131 m  AORTA Ao Root diam: 4.00 cm Ao Asc diam:  3.90 cm  SHUNTS Systemic VTI:  0.13 m Systemic Diam: 2.60 cm Zoila Shutter MD Electronically signed by Zoila Shutter MD Signature Date/Time: 10/09/2019/3:43:30 PM    Final    VAS [place] LOWER EXTREMITY VENOUS (DVT) (ONLY [facility])  Result Date: 10/08/2019  Lower Venous DVT Study Indications: Swelling.  Risk Factors: None identified. Limitations: Body habitus, poor ultrasound/tissue  interface and patient positioning. Comparison Study: No prior studies. Performing Technologist: Chanda Busing RVT  Examination Guidelines: A complete evaluation includes B-mode imaging, spectral Doppler, color Doppler, and power Doppler as needed of all accessible portions of each vessel. Bilateral testing is considered an integral part of a complete examination. Limited examinations for reoccurring indications may be performed as noted. The reflux portion of the exam is performed with the patient in reverse Trendelenburg.  +-----+---------------+---------+-----------+----------+--------------+ RIGHTCompressibilityPhasicitySpontaneityPropertiesThrombus Aging +-----+---------------+---------+-----------+----------+--------------+ CFV  Full           Yes      Yes                                 +-----+---------------+---------+-----------+----------+--------------+   +---------+---------------+---------+-----------+----------+-------------------+ LEFT     CompressibilityPhasicitySpontaneityPropertiesThrombus Aging      +---------+---------------+---------+-----------+----------+-------------------+ CFV      Full           Yes      Yes                                      +---------+---------------+---------+-----------+----------+-------------------+ SFJ      Full                                                             +---------+---------------+---------+-----------+----------+-------------------+ FV Prox  Full                                                             +---------+---------------+---------+-----------+----------+-------------------+  FV Mid   Full           Yes      Yes                                      +---------+---------------+---------+-----------+----------+-------------------+ FV Distal               Yes      Yes                                      +---------+---------------+---------+-----------+----------+-------------------+  POP      Full           Yes      Yes                                      +---------+---------------+---------+-----------+----------+-------------------+ PTV                                                   Patency shown with                                                        color doppler       +---------+---------------+---------+-----------+----------+-------------------+ PERO                                                  Patency shown with                                                        color doppler       +---------+---------------+---------+-----------+----------+-------------------+     Summary: RIGHT: - No evidence of common femoral vein obstruction.  LEFT: - There is no evidence of deep vein thrombosis in the lower extremity. However, portions of this examination were limited- see technologist comments above.  - No cystic structure found in the popliteal fossa.  *See table(s) above for measurements and observations. Electronically signed by Fabienne Bruns MD on 10/08/2019 at 7:02:07 PM.    Final     Assessment and Plan:   Acute on chronic combined heart failure - Patient quit taking his diuretic 2 weeks ago. Presents with worsening LLE and sob - CXR with mild CHF - check BNP  - started on IV lasix 40 mg BID - Was on Torsemide 20 mg BID at home - creatinine improved 1.45>1.18 - Echo this admission showed EF 30-35%. Echo in 2016 showed EF 30-35% and it appears patient never had ischemic evaluation at that time.  - Patient has put out 2.8 L urine overnight, since admission -4.8L - continue with daily weights - Hold starting BB given acute  HF. Would consider starting ARB/Entresto  Cardiomyopathy - EF 30-35% this admission. Unsure if this is ischemic. Patient denies chest pain. Patient has RF for CAD (DM2, HTN, HLD, obesity) - EF has been low in the past but has not had ischemic evaluation - Will possible plan diagnostic cath along with  vascular procedure  Nonhealing Diabetic left foot wound - Vascular consulted - Podiatry plan for  debridement tomorrow with Dr. Ardelle Anton - on abx per IM  Paroxysmal Afib, now Afib RVR - Known diagnosis. Noted to be in and out of Afib over the years - has not been compliant with his Lesia Hausen and rate controlling medications. In the past was on Coreg. - On admission noted to be in Afib RVR and started on IV cardizem and IV heparin - CHADSVASC = 4 (HTN, CHF, DM, PVD) - Remains in afib but rates have improved to the 80-90s . IV cardizem was switched to PO - Would favor BB for rate control over Cardizem given low EF.  - Given podiatry and possible vascular procedure would continue with IV heparin and later transition to DOAC.   HTN - Takes torsemide at baseline. Was previously on Coreg 6.25mg  BID and Valsartan 80 mg daily - Systolics 140-150s - Would start Coreg and ARB/Entresto as above  NSVT -  BB as above  DM2 - SSI per IM  HLD - in the past was on Pravastatin 20 mg daily - Will check Lipid panel  AKI - creatinine improving with diuresis  Noncompliance - spoke to the patient about compliance with medications. Says he understands and is willing to try.   For questions or updates, please contact [facility] Please consult [email or web address] for contact info under     Signed, Cadence David Stall, PA-C  10/09/2019 5:22 PM   I have examined the patient and reviewed assessment and plan and discussed with patient.  Agree with above as stated.  Patient with cardiomyopathy of unclear etiology.  He does have risk factors for coronary atherosclerosis.  He has diuresed significantly.  Atrial fibrillation is under much better control now on rate control medicine.  Plan is for debridement of his foot ulcer tomorrow.  I discussed with Dr. Myra Gianotti regarding potentially doing a coronary angiogram while he has his PV angiogram.  This would at least settle the etiology of his reduced ejection  fraction.  Plan for this on Thursday in lab 8.   Lance Muss

## 2019-10-09 NOTE — Progress Notes (Signed)
Pt refusing to sign consent at this time.

## 2019-10-09 NOTE — ED Notes (Signed)
SDU  Breakfast ordered  

## 2019-10-09 NOTE — Progress Notes (Signed)
ANTICOAGULATION CONSULT NOTE  Pharmacy Consult for heparin Indication: atrial fibrillation  No Known Allergies  Patient Measurements: Height: 6\' 2"  (188 cm) Weight: (!) 384 lb 4.2 oz (174.3 kg) IBW/kg (Calculated) : 82.2 Heparin Dosing Weight: 130kg  Vital Signs: Temp: 98 F (36.7 C) (03/16 1001) Temp Source: Oral (03/16 1001) BP: 150/82 (03/16 1338) Pulse Rate: 94 (03/16 1338)  Labs: Recent Labs    10/08/19 1520 10/09/19 0312 10/09/19 1618  HGB 12.9* 13.1  --   HCT 40.9 40.3  --   PLT 203 267  --   HEPARINUNFRC  --  <0.10* <0.10*  CREATININE 1.45* 1.18  --     Estimated Creatinine Clearance: 119.1 mL/min (by C-G formula based on SCr of 1.18 mg/dL).  Assessment: 54 y.o. male with Afib for heparin  PM level undetectable  Goal of Therapy:  Heparin level 0.3-0.7 units/ml Monitor platelets by anticoagulation protocol: Yes   Plan:  Heparin 4000 units IV bolus, then increase heparin 2850 units/hr Check heparin level in 6-8 hours.    51, PharmD, [facility] Clinical Pharmacist Please see AMION for all Pharmacists' Contact Phone Numbers 10/09/2019, 5:19 PM

## 2019-10-09 NOTE — Progress Notes (Signed)
Pharmacy Antibiotic Note  Maurice Nguyen is a 54 y.o. male admitted on 10/08/2019 with DFI w/PAD L-foot.  Pharmacy has been consulted for vancomycin dosing.  Zosyn per MD.    Plan: Vancomycin 2500 mg IV x 1, then 1500 mg IV every 12 hours Goal AUC 400-550. Expected AUC: 423 SCr used: 1.18 Monitor renal function, Cx and clinical progression Vancomycin levels at steady state F/u Vascular/Podiatry recs   Height: 6\' 2"  (188 cm) Weight: (!) 430 lb (195 kg) IBW/kg (Calculated) : 82.2  Temp (24hrs), Avg:98.9 F (37.2 C), Min:98.9 F (37.2 C), Max:98.9 F (37.2 C)  Recent Labs  Lab 10/08/19 1520 10/09/19 0312  WBC 9.3 11.6*  CREATININE 1.45* 1.18  LATICACIDVEN 1.5  --     Estimated Creatinine Clearance: 127.4 mL/min (by C-G formula based on SCr of 1.18 mg/dL).    No Known Allergies  Antimicrobials this admission: Augmentin 3/15>>3/16 Zosyn 3/16>> Vanc 3/16>>  Dose adjustments this admission: n/a  Microbiology results: 3/15 BCx: ngtd   4/15, PharmD Clinical Pharmacist Please check AMION for all [facility] numbers 10/09/2019 9:57 AM

## 2019-10-09 NOTE — Consult Note (Signed)
WOC Nurse Consult Note: Patient receiving care in [facility] ED 10. Reason for Consult: LE wound Wound type: necrotic wound to plantar surface of left foot, highly malodorous. Red/tan thin fluid running from wound bed Pressure Injury POA: Yes/No/NA Measurement: open wound measures 3cm x 3.5cm.  Surrounded by yellow discoloration, total area measures 9 cm x 7 cm Wound bed: as described Drainage (amount, consistency, odor) highly malodorous. Patient states odor has been present for approximately 2 weeks. Also, it is very painful to the patient. Periwound: edematous, yellow.  Foot is slightly erythematous Dressing procedure/placement/frequency: I placed a xeroform gauze, multiple 4 x 4 dry gauze, and wrapped in kerlex. I have sent a Secure Chat message to the attending asking for an orthopedic or podiatry consult for the patient.  The area on the dorsum of the right foot is a dry, cracked area without drainage or odor.  This was covered with dry gauze.  Monitor the wound area(s) for worsening of condition such as: Signs/symptoms of infection,  Increase in size,  Development of or worsening of odor, Development of pain, or increased pain at the affected locations.  Notify the medical team if any of these develop.  Thank you for the consult.  Discussed plan of care with the patient and bedside nurse.  WOC nurse will not follow at this time.  Please re-consult the WOC team if needed.  Helmut Muster, RN, MSN, CWOCN, CNS-BC, pager [phone]

## 2019-10-09 NOTE — Plan of Care (Signed)
  Problem: Education: Goal: Knowledge of General Education information will improve Description: Including pain rating scale, medication(s)/side effects and non-pharmacologic comfort measures Outcome: Progressing   Problem: Nutrition: Goal: Adequate nutrition will be maintained Outcome: Progressing   

## 2019-10-09 NOTE — Plan of Care (Signed)

## 2019-10-09 NOTE — Progress Notes (Signed)
ANTICOAGULATION CONSULT NOTE  Pharmacy Consult for heparin Indication: atrial fibrillation  No Known Allergies  Patient Measurements: Height: 6\' 2"  (188 cm) Weight: (!) 430 lb (195 kg) IBW/kg (Calculated) : 82.2 Heparin Dosing Weight: 130kg  Vital Signs: BP: 146/93 (03/16 0315) Pulse Rate: 103 (03/16 0315)  Labs: Recent Labs    10/08/19 1520 10/09/19 0312  HGB 12.9* 13.1  HCT 40.9 40.3  PLT 203 267  HEPARINUNFRC  --  <0.10*  CREATININE 1.45* 1.18    Estimated Creatinine Clearance: 127.4 mL/min (by C-G formula based on SCr of 1.18 mg/dL).  Assessment: 54 y.o. male with Afib for heparin  Goal of Therapy:  Heparin level 0.3-0.7 units/ml Monitor platelets by anticoagulation protocol: Yes   Plan:  Heparin 4000 units IV bolus, then increase heparin 2350 units/hr Check heparin level in 6 hours.    53 10/09/2019,3:55 AM

## 2019-10-09 NOTE — Consult Note (Addendum)
Hospital Consult    Reason for Consult:  Non healing wound left foot Requesting Physician:  Alvino Chapel MRN #:  [id number]  History of Present Illness: This is a 54 y.o. male with past medical history significant for atrial fibrillation with RVR, CHF, uncontrolled insulin-dependent diabetes mellitus with hemoglobin A1c of 12, and diabetic left foot ulceration.  This has been followed by Dr. Ardelle Anton with podiatry as an outpatient.  He has been having pain with walking and has stopped taking his diuretic b/c it hurt to walk to the bathroom.   Since then, he has had increasing swelling in his legs.  CT foot did not show osteomyelitis but did show a small amount of superficial gas that radiologist felt just correlated with the ulcer.  The pt is on Vanc and zosyn.  He has also been started on IV heparin due to atrial fibrillation.  When discussing angiography with the patient he states that he cannot lie flat due to shortness of breath.  For years he has been sleeping in a recliner sitting up.  Arterial studies performed earlier this month demonstrates noncompressible tibial vessels however patent three-vessel runoff by duplex with normal toe pressure.  He has some mild areas of stenosis in his left SFA as well.  The pt is not on a statin for cholesterol management.  The pt is not on a daily aspirin.   Other AC:  Heparin gtt The pt is on CCB for afib   The pt is diabetic.   Tobacco hx:  never  Past Medical History:  Diagnosis Date  . A-fib (HCC)   . Anticoagulated, on Xarelto, Chads2Vas score 2 10/02/2013  . Back pain, chronic 07/13/2012  . CHF (congestive heart failure) (HCC)   . 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 CKD (chronic kidney disease), stage II   . Complication of anesthesia    " THEY LOST ME AND BROUGHT ME BACK "  . Hypertension 07/13/2012  . Irregular heart beat   . Lumbar herniated disc   . Morbid obesity (HCC)   . Obesity, morbid, BMI 50 or higher (HCC)  07/13/2012  . 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 Renal insufficiency 10/08/2019  . Shortness of breath   . Type II diabetes mellitus (HCC) 07/13/2012    Past Surgical History:  Procedure Laterality Date  . ABSCESS DRAINAGE    . COLONOSCOPY  08/22/2012   Procedure: COLONOSCOPY;  Surgeon: Shirley Friar, MD;  Location: [facility];  Service: Endoscopy;  Laterality: N/A;  . DENTAL SURGERY      No Known Allergies  Prior to Admission medications   Medication Sig Start Date End Date Taking? Authorizing Provider  collagenase (SANTYL) ointment Apply 1 application topically daily. 09/27/19  Yes Vivi Barrack, DPM  insulin isophane & regular human (NOVOLIN 70/30 FLEXPEN) (70-30) 100 UNIT/ML KwikPen Inject 60 Units into the skin in the morning and at bedtime.   Yes [provider]  metFORMIN (GLUCOPHAGE-XR) 500 MG 24 hr tablet Take 1,000 mg by mouth in the morning and at bedtime.   Yes [provider]  naproxen sodium (ALEVE) 220 MG tablet Take 220-440 mg by mouth 2 (two) times daily as needed (for pain or headaches).   Yes [provider]  nitroGLYCERIN (NITROSTAT) 0.4 MG SL tablet Place 1 tablet (0.4 mg total) under the tongue every 5 (five) minutes x 3 doses as needed for chest pain. 02/03/15  Yes [name], MD  oxyCODONE-acetaminophen (PERCOCET/ROXICET)  5-325 MG tablet Take 1 tablet by mouth every 6 (six) hours as needed for moderate pain. 10/04/15  Yes Osvaldo Shipper, MD  torsemide (DEMADEX) 20 MG tablet TAKE 2 TABLETS BY MOUTH ONCE DAILY Patient taking differently: Take 40 mg by mouth daily.  05/21/16  Yes Nahser, Deloris Ping, MD  ammonium lactate (AMLACTIN) 12 % cream Apply topically as needed for dry skin. Patient not taking: Reported on 10/08/2019 03/23/19   Vivi Barrack, DPM  BD PEN NEEDLE NANO 2ND GEN 32G X 4 MM MISC in the morning, at noon, and at bedtime.  03/07/19   [provider]    Social History   Socioeconomic History  .  Marital status: Single    Spouse name: Not on file  . Number of children: Not on file  . Years of education: Not on file  . Highest education level: Not on file  Occupational History  . Occupation: N/A  Tobacco Use  . Smoking status: Never Smoker  . Smokeless tobacco: Never Used  Substance and Sexual Activity  . Alcohol use: No    Comment: rarely  . Drug use: No  . Sexual activity: Yes  Other Topics Concern  . Not on file  Social History Narrative   Denies caffeine use    Social Determinants of Health   Financial Resource Strain:   . Difficulty of Paying Living Expenses:   Food Insecurity:   . Worried About Programme researcher, broadcasting/film/video in the Last Year:   . Barista in the Last Year:   Transportation Needs:   . Freight forwarder (Medical):   Marland Kitchen Lack of Transportation (Non-Medical):   Physical Activity:   . Days of Exercise per Week:   . Minutes of Exercise per Session:   Stress:   . Feeling of Stress :   Social Connections:   . Frequency of Communication with Friends and Family:   . Frequency of Social Gatherings with Friends and Family:   . Attends Religious Services:   . Active Member of Clubs or Organizations:   . Attends Banker Meetings:   Marland Kitchen Marital Status:   Intimate Partner Violence:   . Fear of Current or Ex-Partner:   . Emotionally Abused:   Marland Kitchen Physically Abused:   . Sexually Abused:      No family history on file.  ROS: [x]  Positive   [ ]  Negative   [ ]  All sytems reviewed and are negative  Cardiac: [x]  afib  [x]  CHF  Vascular: []  pain in legs while walking []  pain in legs at rest []  pain in legs at night [x]  non-healing ulcers []  hx of DVT [x]  swelling in legs  Pulmonary: []  productive cough []  asthma/wheezing []  home O2  Neurologic: []  weakness in []  arms []  legs []  numbness in []  arms []  legs []  hx of CVA []  mini stroke [] difficulty speaking or slurred speech []  temporary loss of vision in one eye []   dizziness  Hematologic: []  hx of cancer []  bleeding problems []  problems with blood clotting easily  Endocrine:   [x]  diabetes []  thyroid disease  GI []  vomiting blood []  blood in stool  GU: []  CKD/renal failure []  HD--[]  M/W/F or []  T/T/S []  burning with urination []  blood in urine  Psychiatric: []  anxiety []  depression  Musculoskeletal: []  arthritis []  joint pain  Integumentary: []  rashes [x]  ulcers  Constitutional: []  fever []  chills   Physical Examination  Vitals:   10/09/19 0845 10/09/19  1001  BP: (!) 145/68 (!) 147/78  Pulse: 93   Resp: 17   Temp:  98 F (36.7 C)  SpO2: 99%    Body mass index is 49.34 kg/m.  General:  WDWN in NAD Gait: Not observed HENT: WNL, normocephalic Pulmonary: normal non-labored breathing, without Rales, rhonchi,  wheezing Cardiac: irregular Abdomen:  soft, NT/ND, no masses Skin: without rashes Vascular Exam/Pulses:  Right Left  Radial 2+ (normal) 2+ (normal)  Femoral  exam limited due to large body habitus exam limited due to large body habitus  Popliteal absent absent  DP absent Faintly palpable L ATA  PT absent absent  Peroneal absent absent   Extremities: Bilateral lower extremities edematous with cracking skin; indurated; left dorsal foot ulceration with necrotic base Musculoskeletal: no muscle wasting or atrophy  Neurologic: A&O X 3;  No focal weakness or paresthesias are detected; speech is fluent/normal Psychiatric:  The pt has Normal affect.  CBC    Component Value Date/Time   WBC 11.6 (H) 10/09/2019 0312   RBC 4.17 (L) 10/09/2019 0312   HGB 13.1 10/09/2019 0312   HCT 40.3 10/09/2019 0312   PLT 267 10/09/2019 0312   MCV 96.6 10/09/2019 0312   MCH 31.4 10/09/2019 0312   MCHC 32.5 10/09/2019 0312   RDW 13.5 10/09/2019 0312   LYMPHSABS 1.8 10/08/2019 1520   MONOABS 1.0 10/08/2019 1520   EOSABS 0.1 10/08/2019 1520   BASOSABS 0.0 10/08/2019 1520    BMET    Component Value Date/Time   NA 135  10/09/2019 0312   K 4.6 10/09/2019 0312   CL 97 (L) 10/09/2019 0312   CO2 27 10/09/2019 0312   GLUCOSE 104 (H) 10/09/2019 0312   BUN 18 10/09/2019 0312   CREATININE 1.18 10/09/2019 0312   CALCIUM 8.4 (L) 10/09/2019 0312   GFRNONAA >60 10/09/2019 0312   GFRAA >60 10/09/2019 0312    COAGS: Lab Results  Component Value Date   INR 1.45 10/02/2015   INR 1.04 09/27/2013   INR 1.06 08/22/2012     Non-Invasive Vascular Imaging:   ABI's 09/26/2019: +-------+------------+-----------+----------------+------------+  ABI/TBIToday's ABI Today's TBIPrevious ABI  Previous TBI  +-------+------------+-----------+----------------+------------+  Right not assessed1.11    non-compressible.97       +-------+------------+-----------+----------------+------------+  Left  not assessed1.10    non-compressible.98       +-------+------------+-----------+----------------+------------+   Normal waveforms in toes bilaterally.  BLE arterial duplex 09/26/2019: Summary:  Right: 30-49% stenosis in the proximal SFA, distal to the ostium.  30-49% stenosis in the proximal popliteal artery.   Left: 30-49% stenosis in the proximal and mid SFA.  30-49% stenosis in the distal SFA, per velocity ratio.  30-49% stenosis in the proximal-distal popliteal vein.  30-49% stenosis in the TPT.   ASSESSMENT/PLAN: This is a 54 y.o. male with non healing diabetic foot ulcer on the left foot.  -Arterial studies performed earlier this month demonstrate several mild areas of stenosis in the SFA however patent three-vessel runoff although tibial vessels were noncompressible; toe pressure within normal limits -On exam he has a faintly palpable left anterior tibial artery pulse -Patient is scheduled for debridement of ulceration by Dr. Ardelle Anton tomorrow afternoon -Patient will need general anesthesia for angiography due to inability to lie flat for any amount of time without shortness of  breath -Patient is high risk for limb loss if unable to heal dorsal foot ulceration -On-call vascular surgeon Dr. Myra Gianotti will evaluate the patient later today and provide further treatment plans  Emilie Rutter, PA-C [facility] [phone]   I agree with the above.  I have seen and evaluated the patient.  Briefly, this is a 54 year old gentleman with history of congestive heart failure and atrial fibrillation.  He is currently on heparin.  He also has uncontrolled diabetes with hemoglobin A1c of 12.  He has a foot ulcer that has been followed by podiatry as an outpatient, now requiring surgical debridement.  He is on broad-spectrum antibiotics.  He does have vascular lab studies that suggest adequate perfusion, however duplex study suggested diffuse mild to moderate stenosis in the superficial femoral artery.  He is scheduled for debridement tomorrow.  Based on the level of bleeding at the time of debridement, I will determine whether or not he needs angiography.  If there is significant bleeding he may not need this however my suspicion is that lower extremity angiography would be beneficial to ensure that he has adequate perfusion.  I also just spoke with Dr. Irish Lack with cardiology who would like to perform cardiac catheterization simultaneously.  This will try to be arranged for Thursday.  Annamarie Major

## 2019-10-10 ENCOUNTER — Inpatient Hospital Stay (HOSPITAL_COMMUNITY): Payer: Medicare HMO | Admitting: Anesthesiology

## 2019-10-10 ENCOUNTER — Encounter (HOSPITAL_COMMUNITY): Payer: Self-pay | Admitting: Internal Medicine

## 2019-10-10 ENCOUNTER — Encounter (HOSPITAL_COMMUNITY)
Admission: EM | Disposition: A | Discharge status: Skilled Nursing Facility | Payer: Self-pay | Source: Home / Self Care | Attending: Student

## 2019-10-10 DIAGNOSIS — I509 Heart failure, unspecified: Secondary | ICD-10-CM

## 2019-10-10 DIAGNOSIS — R5381 Other malaise: Secondary | ICD-10-CM

## 2019-10-10 DIAGNOSIS — I4821 Permanent atrial fibrillation: Secondary | ICD-10-CM

## 2019-10-10 DIAGNOSIS — Z6841 Body Mass Index (BMI) 40.0 and over, adult: Secondary | ICD-10-CM

## 2019-10-10 DIAGNOSIS — Z9989 Dependence on other enabling machines and devices: Secondary | ICD-10-CM

## 2019-10-10 DIAGNOSIS — G4733 Obstructive sleep apnea (adult) (pediatric): Secondary | ICD-10-CM

## 2019-10-10 HISTORY — PX: WOUND DEBRIDEMENT: SHX247

## 2019-10-10 LAB — BASIC METABOLIC PANEL
Anion gap: 14 (ref 5–15)
BUN: 24 mg/dL — ABNORMAL HIGH (ref 6–20)
CO2: 25 mmol/L (ref 22–32)
Calcium: 8.1 mg/dL — ABNORMAL LOW (ref 8.9–10.3)
Chloride: 97 mmol/L — ABNORMAL LOW (ref 98–111)
Creatinine, Ser: 1.29 mg/dL — ABNORMAL HIGH (ref 0.61–1.24)
GFR calc Af Amer: >60 mL/min (ref >60)
GFR calc non Af Amer: >60 mL/min (ref >60)
Glucose, Bld: 162 mg/dL — ABNORMAL HIGH (ref 70–99)
Potassium: 3.5 mmol/L (ref 3.5–5.1)
Sodium: 136 mmol/L (ref 135–145)

## 2019-10-10 LAB — HEPARIN LEVEL (UNFRACTIONATED)
Heparin Unfractionated: 0.23 IU/mL — ABNORMAL LOW (ref 0.30–0.70)
Heparin Unfractionated: 0.63 IU/mL (ref 0.30–0.70)
Heparin Unfractionated: 0.55 IU/mL (ref 0.30–0.70)

## 2019-10-10 LAB — GLUCOSE, CAPILLARY
Glucose-Capillary: 141 mg/dL — ABNORMAL HIGH (ref 70–99)
Glucose-Capillary: 98 mg/dL (ref 70–99)
Glucose-Capillary: 92 mg/dL (ref 70–99)
Glucose-Capillary: 111 mg/dL — ABNORMAL HIGH (ref 70–99)
Glucose-Capillary: 176 mg/dL — ABNORMAL HIGH (ref 70–99)
Glucose-Capillary: 257 mg/dL — ABNORMAL HIGH (ref 70–99)

## 2019-10-10 LAB — LIPID PANEL
Cholesterol: 109 mg/dL (ref 0–200)
HDL: 34 mg/dL — ABNORMAL LOW (ref >40)
LDL Cholesterol: 65 mg/dL (ref 0–99)
Total CHOL/HDL Ratio: 3.2 RATIO
Triglycerides: 49 mg/dL (ref <150)
VLDL: 10 mg/dL (ref 0–40)

## 2019-10-10 LAB — CBC
HCT: 36.3 % — ABNORMAL LOW (ref 39.0–52.0)
Hemoglobin: 11.7 g/dL — ABNORMAL LOW (ref 13.0–17.0)
MCH: 31.1 pg (ref 26.0–34.0)
MCHC: 32.2 g/dL (ref 30.0–36.0)
MCV: 96.5 fL (ref 80.0–100.0)
Platelets: 243 10*3/uL (ref 150–400)
RBC: 3.76 MIL/uL — ABNORMAL LOW (ref 4.22–5.81)
RDW: 13.5 % (ref 11.5–15.5)
WBC: 10.7 10*3/uL — ABNORMAL HIGH (ref 4.0–10.5)
nRBC: 0 % (ref 0.0–0.2)

## 2019-10-10 LAB — MAGNESIUM: Magnesium: 1.8 mg/dL (ref 1.7–2.4)

## 2019-10-10 SURGERY — DEBRIDEMENT, WOUND
Anesthesia: Monitor Anesthesia Care | Laterality: Left

## 2019-10-10 MED ORDER — PROPOFOL 10 MG/ML IV BOLUS
INTRAVENOUS | Status: AC
  Filled 2019-10-10: qty 40

## 2019-10-10 MED ORDER — POTASSIUM CHLORIDE CRYS ER 20 MEQ PO TBCR
40.0000 meq | EXTENDED_RELEASE_TABLET | Freq: Once | ORAL | Status: AC
  Administered 2019-10-10: 40 meq via ORAL
  Filled 2019-10-10: qty 2

## 2019-10-10 MED ORDER — 0.9 % SODIUM CHLORIDE (POUR BTL) OPTIME
TOPICAL | Status: DC | PRN
  Administered 2019-10-10: 1000 mL

## 2019-10-10 MED ORDER — LIDOCAINE 2% (20 MG/ML) 5 ML SYRINGE
INTRAMUSCULAR | Status: AC
  Filled 2019-10-10: qty 5

## 2019-10-10 MED ORDER — BUPIVACAINE HCL (PF) 0.5 % IJ SOLN
INTRAMUSCULAR | Status: AC
  Filled 2019-10-10: qty 30

## 2019-10-10 MED ORDER — PROPOFOL 500 MG/50ML IV EMUL
INTRAVENOUS | Status: DC | PRN
  Administered 2019-10-10: 25 ug/kg/min via INTRAVENOUS

## 2019-10-10 MED ORDER — ONDANSETRON HCL 4 MG/2ML IJ SOLN: 4.0000 mg | Freq: Once | INTRAMUSCULAR | Status: DC | PRN

## 2019-10-10 MED ORDER — HEPARIN (PORCINE) 25000 UT/250ML-% IV SOLN
3150.0000 [IU]/h | INTRAVENOUS | Status: DC
  Administered 2019-10-10 – 2019-10-11 (×2): 3150 [IU]/h via INTRAVENOUS
  Filled 2019-10-10 (×3): qty 250

## 2019-10-10 MED ORDER — HEPARIN BOLUS VIA INFUSION
4000.0000 [IU] | Freq: Once | INTRAVENOUS | Status: AC
  Administered 2019-10-10: 4000 [IU] via INTRAVENOUS
  Filled 2019-10-10: qty 4000

## 2019-10-10 MED ORDER — BUPIVACAINE HCL 0.5 % IJ SOLN
INTRAMUSCULAR | Status: DC | PRN
  Administered 2019-10-10: 9 mg

## 2019-10-10 MED ORDER — OXYCODONE HCL 5 MG/5ML PO SOLN: 5.0000 mg | Freq: Once | ORAL | Status: DC | PRN

## 2019-10-10 MED ORDER — MAGNESIUM SULFATE 2 GM/50ML IV SOLN
2.0000 g | Freq: Once | INTRAVENOUS | Status: AC
  Administered 2019-10-10: 2 g via INTRAVENOUS
  Filled 2019-10-10: qty 50

## 2019-10-10 MED ORDER — LIDOCAINE HCL (CARDIAC) PF 100 MG/5ML IV SOSY
PREFILLED_SYRINGE | INTRAVENOUS | Status: DC | PRN
  Administered 2019-10-10: 20 mg via INTRAVENOUS

## 2019-10-10 MED ORDER — LACTATED RINGERS IV SOLN: INTRAVENOUS | Status: DC

## 2019-10-10 MED ORDER — PROPOFOL 10 MG/ML IV BOLUS
INTRAVENOUS | Status: DC | PRN
  Administered 2019-10-10 (×5): 10 mg via INTRAVENOUS

## 2019-10-10 MED ORDER — LIDOCAINE HCL (PF) 1 % IJ SOLN
INTRAMUSCULAR | Status: AC
  Filled 2019-10-10: qty 30

## 2019-10-10 MED ORDER — LIDOCAINE 1 % OPTIME INJ - NO CHARGE
INTRAMUSCULAR | Status: DC | PRN
  Administered 2019-10-10: 9 mL

## 2019-10-10 MED ORDER — FENTANYL CITRATE (PF) 100 MCG/2ML IJ SOLN: 25.0000 ug | INTRAMUSCULAR | Status: DC | PRN

## 2019-10-10 MED ORDER — OXYCODONE HCL 5 MG PO TABS: 5.0000 mg | ORAL_TABLET | Freq: Once | ORAL | Status: DC | PRN

## 2019-10-10 MED ORDER — PHENYLEPHRINE 40 MCG/ML (10ML) SYRINGE FOR IV PUSH (FOR BLOOD PRESSURE SUPPORT)
PREFILLED_SYRINGE | INTRAVENOUS | Status: DC | PRN
  Administered 2019-10-10: 40 ug via INTRAVENOUS

## 2019-10-10 SURGICAL SUPPLY — 51 items
BANDAGE ESMARK 6X9 LF (GAUZE/BANDAGES/DRESSINGS) IMPLANT
BLADE AVERAGE 25X9 (BLADE) ×2 IMPLANT
BLADE SURG 10 STRL SS (BLADE) ×2 IMPLANT
BNDG CMPR 9X6 STRL LF SNTH (GAUZE/BANDAGES/DRESSINGS)
BNDG COHESIVE 4X5 TAN STRL (GAUZE/BANDAGES/DRESSINGS) ×2 IMPLANT
BNDG ELASTIC 4X5.8 VLCR STR LF (GAUZE/BANDAGES/DRESSINGS) ×1 IMPLANT
BNDG ESMARK 6X9 LF (GAUZE/BANDAGES/DRESSINGS)
BNDG GAUZE ELAST 4 BULKY (GAUZE/BANDAGES/DRESSINGS) ×1 IMPLANT
COVER WAND RF STERILE (DRAPES) ×2 IMPLANT
CUFF TOURN SGL QUICK 34 (TOURNIQUET CUFF)
CUFF TOURN SGL QUICK 42 (TOURNIQUET CUFF) IMPLANT
CUFF TRNQT CYL 34X4.125X (TOURNIQUET CUFF) IMPLANT
DECANTER SPIKE VIAL GLASS SM (MISCELLANEOUS) ×2 IMPLANT
DRAPE C-ARM MINI (DRAPES) ×2 IMPLANT
DRAPE U-SHAPE 47X51 STRL (DRAPES) ×4 IMPLANT
DURAPREP 26ML APPLICATOR (WOUND CARE) ×2 IMPLANT
ELECT REM PT RETURN 9FT ADLT (ELECTROSURGICAL) ×2
ELECTRODE REM PT RTRN 9FT ADLT (ELECTROSURGICAL) ×1 IMPLANT
GAUZE SPONGE 4X4 12PLY STRL (GAUZE/BANDAGES/DRESSINGS) ×2 IMPLANT
GAUZE SPONGE 4X4 16PLY XRAY LF (GAUZE/BANDAGES/DRESSINGS) ×1 IMPLANT
GAUZE XEROFORM 5X9 LF (GAUZE/BANDAGES/DRESSINGS) ×2 IMPLANT
GLOVE BIO SURGEON STRL SZ8 (GLOVE) ×4 IMPLANT
GOWN STRL REUS W/ TWL LRG LVL3 (GOWN DISPOSABLE) ×1 IMPLANT
GOWN STRL REUS W/ TWL XL LVL3 (GOWN DISPOSABLE) ×1 IMPLANT
GOWN STRL REUS W/TWL LRG LVL3 (GOWN DISPOSABLE) ×2
GOWN STRL REUS W/TWL XL LVL3 (GOWN DISPOSABLE) ×2
KIT BASIN OR (CUSTOM PROCEDURE TRAY) ×2 IMPLANT
KIT TURNOVER KIT B (KITS) ×2 IMPLANT
NDL 25GX 5/8IN NON SAFETY (NEEDLE) ×1 IMPLANT
NEEDLE 25GX 5/8IN NON SAFETY (NEEDLE) ×2 IMPLANT
NS IRRIG 1000ML POUR BTL (IV SOLUTION) ×2 IMPLANT
PACK ORTHO EXTREMITY (CUSTOM PROCEDURE TRAY) ×2 IMPLANT
PAD ABD 8X10 STRL (GAUZE/BANDAGES/DRESSINGS) ×1 IMPLANT
PAD ARMBOARD 7.5X6 YLW CONV (MISCELLANEOUS) ×4 IMPLANT
PAD CAST 4YDX4 CTTN HI CHSV (CAST SUPPLIES) ×1 IMPLANT
PADDING CAST COTTON 4X4 STRL (CAST SUPPLIES) ×2
PROBE DEBRIDE SONICVAC MISONIX (TIP) ×1 IMPLANT
SPONGE LAP 18X18 RF (DISPOSABLE) ×4 IMPLANT
STAPLER VISISTAT 35W (STAPLE) ×2 IMPLANT
STOCKINETTE IMPERVIOUS LG (DRAPES) IMPLANT
SUT ETHILON 2 0 PSLX (SUTURE) IMPLANT
SUT PROLENE 3 0 PS 2 (SUTURE) ×4 IMPLANT
SWAB COLLECTION DEVICE MRSA (MISCELLANEOUS) IMPLANT
SWAB CULTURE ESWAB REG 1ML (MISCELLANEOUS) IMPLANT
SYR CONTROL 10ML LL (SYRINGE) ×2 IMPLANT
TOWEL GREEN STERILE (TOWEL DISPOSABLE) ×2 IMPLANT
TOWEL GREEN STERILE FF (TOWEL DISPOSABLE) ×2 IMPLANT
TUBE CONNECTING 12X1/4 (SUCTIONS) ×2 IMPLANT
TUBE IRRIGATION SET MISONIX (TUBING) ×1 IMPLANT
WATER STERILE IRR 1000ML POUR (IV SOLUTION) ×2 IMPLANT
YANKAUER SUCT BULB TIP NO VENT (SUCTIONS) ×2 IMPLANT

## 2019-10-10 NOTE — Plan of Care (Signed)
  Problem: Education: Goal: Knowledge of General Education information will improve Description Including pain rating scale, medication(s)/side effects and non-pharmacologic comfort measures Outcome: Progressing   

## 2019-10-10 NOTE — Progress Notes (Signed)
Patient seen in pre-op. We discussed the procedure including alternatives, risks, complications. There is malodor today. Still having pain to the foot. After further discussion he is agreeable to the surgery and consent was signed. NPO since midnight. Plan for lower extremity angio and diagnostic cardiac cath tomorrow. No further questions or concerns. Will call wife postop at his request.   Ovid Curd, DPM O: [phone] C: [phone]

## 2019-10-10 NOTE — Op Note (Signed)
10/10/2019  12:57 PM  PATIENT:  Maurice Nguyen  54 y.o. male  PRE-OPERATIVE DIAGNOSIS:  left foot wound/infection  POST-OPERATIVE DIAGNOSIS:  left foot wound/infection  PROCEDURE:  Procedure(s): DEBRIDEMENT WOUND (Left)  SURGEON:  Surgeon(s) and Role:    * Trula Slade, DPM - Primary  PHYSICIAN ASSISTANT:   ASSISTANTS: none   ANESTHESIA:   MAC  EBL:  20 mL   BLOOD ADMINISTERED:none  DRAINS: none   LOCAL MEDICATIONS USED:  OTHER 18cc lidocaine and marcaine plain  SPECIMEN:  No Specimen  DISPOSITION OF SPECIMEN:  wound culture left foot  COUNTS:  YES  TOURNIQUET:  * No tourniquets in log *  DICTATION: .Dragon Dictation  PLAN OF CARE: Admit to inpatient   PATIENT DISPOSITION:  PACU - hemodynamically stable.   Delay start of Pharmacological VTE agent (>24hrs) due to surgical blood loss or risk of bleeding: no  Indications for surgery: 55 year old male presents the hospital in A. fib with RVR found to have worsening foot ulceration.  Since I last saw the patient he had worsening pain to his foot and due to the pain he stopped taking his medications.  Due to increased swelling as well as worsening the wound he was brought to the hospital admitted.  He was started on IV antibiotics.  CT scan did not show osteomyelitis but there was soft tissue air likely corresponding to the wound.  Mild leukocytosis and low-grade fever.  Given the appearance of the foot I recommended wound debridement, incision and drainage.  We discussed the procedure as well as the postoperative course as well as alternatives, risks, complications.  Has high risk of amputation  Procedure in detail: The patient was both verbally and visually identified by myself, the nursing staff, anesthesia staff in the preoperative area.  This range of the abdomen via stretcher.  He was left on the stretcher for the procedure.  After sedation was given a mixture of 18 cc of lidocaine, Marcaine plain was  infiltrated in a regional block fashion of the surgical site.  The left lower extremities and scrubbed, prepped, draped in normal sterile fashion.  Attention was first directed along the area of the necrotic, fibrotic wound on the lateral midfoot.  I utilized a 15 blade scalpel to excise the wound.  This went down to fascia.  There was purulence underneath the necrotic tissue.  After debridement the wound measured 3.5 x 3 x 1.5 cm.  There was no tracking present.  There was found to be a large old blister on the plantar aspect of the foot which was removed and along the heel which initially appeared to be macerated tissue there was also necrotic wound present.  I excised the area of necrosis along the heel as well utilizing 15 blade scalpel.  After debridement the wound measured 4.5 x 2.5 x 1.5 cm.  There is no purulence of this area.  There is no tracking undermining or tunneling.  No further ulcerations were identified.  The two wounds were packed with saline wet-to-dry followed by Xeroform and a dry sterile dressing.  He was woken from anesthesia and found to tolerate the procedure well.  Transferred to PACU vital stable and vascular status intact.  Plan for angio of the lower extremities and cardiac cath tomorrow. Remain in IV antibiotics for now. Await wound culture. May need to return to the OR for debridement/VAC application this admission vs outpatient. Will monitor closely and clinical response.

## 2019-10-10 NOTE — Plan of Care (Signed)
  Problem: Clinical Measurements: Goal: Respiratory complications will improve Outcome: Progressing   

## 2019-10-10 NOTE — Transfer of Care (Signed)
Immediate Anesthesia Transfer of Care Note  Patient: Maurice Nguyen  Procedure(s) Performed: DEBRIDEMENT WOUND (Left )  Patient Location: PACU  Anesthesia Type:MAC  Level of Consciousness: awake, alert , oriented and patient cooperative  Airway & Oxygen Therapy: Patient Spontanous Breathing and Patient connected to face mask oxygen  Post-op Assessment: Report given to RN and Post -op Vital signs reviewed and stable  Post vital signs: Reviewed and stable  Last Vitals:  Vitals Value Taken Time  BP    Temp    Pulse    Resp    SpO2      Last Pain:  Vitals:   10/10/19 1025  TempSrc: Oral  PainSc: 0-No pain         Complications: No apparent anesthesia complications

## 2019-10-10 NOTE — Progress Notes (Signed)
I attempted to call the patients cell phone and room phone twice to further discuss the surgery planned for today. I will be by to see him in person as well but wanted to try to reach him in person.   I tried calling the patients room again and he answered. He states he wants to make sure that I do a good job in surgery today and is nervous. We discussed the surgery and postop course. We discussed alternatives, risks, complications including but not limited to return to the OR, spread of infection, amputation and general risks of surgery given his cardiac history including stroke, heart attack or death. However given the wound and infection I believe the benefits outweigh the risks. He agrees to proceed with surgery. I will also see him in person prior to surgery.

## 2019-10-10 NOTE — Progress Notes (Signed)
PROGRESS NOTE  ZEALAND KNIGGE FYT:[id number] DOB: 04-02-1966   PCP: Tracey Harries, MD  Patient is from: home.  Bedbound?  DOA: 10/08/2019 LOS: 1  Brief Narrative / Interim history: 54 yo AA male with history of uncontrolled DM-2 with left foot wound, HTN, systolic HF EF 30-35%, paroxysmal A fib and debility presenting with worsening left foot infection and worsening BLE edema.  He has been followed by Dr. Ardelle Anton of Podiatry, was scheduled also to consult with vascular surgery Dr. Darrick Penna on 3/18.  Patient stopped his diuretics for about 2 weeks as he was unable to get to the bathroom.   CT left foot did not reveal focal fluid collection or abscess, no CT findings of osteo.  He was also found to be in A. fib with RVR and started on Cardizem drip and IV Lasix.   He was admitted for diabetic left foot wound in the setting of possible PAD, combined CHF exacerbation and A. fib with RVR.  Podiatry, vascular surgery and cardiology following.  Subjective: No major events overnight or this morning.  Somewhat anxious about I&D today.  Reports "mild pain in left foot.  Denies shortness of breath, GI or UTI symptoms.  Objective: Vitals:   10/10/19 1300 10/10/19 1315 10/10/19 1330 10/10/19 1353  BP: 121/81 134/75 137/79 (!) 143/88  Pulse: 84 78 82 84  Resp: (!) 21 19 (!) 21 11  Temp: (!) 97.5 F (36.4 C)  97.6 F (36.4 C)   TempSrc:      SpO2: 100% 100% 100% 99%  Weight:      Height:        Intake/Output Summary (Last 24 hours) at 10/10/2019 1457 Last data filed at 10/10/2019 1315 Gross per 24 hour  Intake 2469.79 ml  Output 5520 ml  Net -3050.21 ml   Filed Weights   10/09/19 1001 10/10/19 0500 10/10/19 1118  Weight: (!) 174.3 kg (!) 188.4 kg (!) 188.4 kg    Examination:  GENERAL: No apparent distress.  Nontoxic. HEENT: MMM.  Vision and hearing grossly intact.  NECK: Supple.  Difficult to assess JVD due to body habitus. RESP: On RA.  No IWOB.  Fair aeration but limited exam due  to body habitus. CVS:  RRR. Heart sounds normal.  ABD/GI/GU: Bowel sounds present. Soft. Non tender.  MSK/EXT: BLE chronic venous insufficiency with lymphedema.  Dressing over left foot. SKIN: BLE stasis dermatitis NEURO: Awake, alert and oriented appropriately.  No apparent focal neuro deficit other than BLE weakness. PSYCH: Calm. Normal affect.  Procedures:  3/17-left foot wound debridement by Dr. Ardelle Anton  Assessment & Plan: Diabetic foot wound/abscess with peripheral artery disease-ABI completed as an outpatient on 3/3 which revealed diminished flow to the left foot. CRP 12, sed rate 53.  Blood cultures negative so far. -Debridement by podiatry, Dr. Ardelle Anton on 10/10/2019 -Vascular surgery following-coordinating angiography with LHC with cardiology -Augmentin --> Vanco/zosyn  -Follow wound cultures and adjust antibiotic as appropriate  A. fib RVR: Likely due to fluid overload.  Rate controlled on p.o. Cardizem -On p.o. Cardizem but prefer beta-blocker in the setting of CHF-defer to cardiology -On IV insulin for anticoagulation  Acute on chronic combined systolic and diastolic heart failure: Echo with EF of 30 to 35% (unchanged from 2016), indeterminate LV diastolic and RV function, moderate LAE and ascending aortic dilation to 39 mm.  Had about 6 L urine output in the last 24 hours.  Creatinine slightly up. -On IV Lasix 40 mg twice daily per cardiology -GDMT-losartan -Benefits  from ICD when infection is under good control? -Plan for [facility] by cardiology when he goes for LE angio -Monitor fluid status, renal function and electrolytes -Sodium and fluid restriction  Uncontrolled DM-2 with hyperglycemia and diabetic foot ulcer: A1c 11.2%. Recent Labs    10/10/19 1059 10/10/19 1300 10/10/19 1431  GLUCAP 98 92 111*  -Continue current insulin regimen -Continue statin -Could benefit from newer agents with cardiovascular benefits   AKI/azotemia: Baseline Cr 0.9-1.1> 1.45 (admit)>  1.18> 1.29 -Continue monitoring while on diuretics and ARB  OSA on CPAP -Continue nightly CPAP  Morbid obesity: Body mass index is 53.33 kg/m. -May benefit from dietary intervention and GLP-1 inhibitors. -Reportedly bedbound  Debility/physical deconditioning-reportedly bedbound at baseline. -PT/OT -May need a wheelchair.        Nutrition Problem: Increased nutrient needs Etiology: wound healing  Signs/Symptoms: estimated needs  Interventions: MVI, Premier Protein, Refer to RD note for recommendations   DVT prophylaxis: On heparin for A. fib Code Status: Full code Family Communication: Patient and/or RN. Available if any question.  Discharge barrier: On IV antibiotics for LE diabetic wound, acute CHF on IV Lasix, atrial fibrillation Patient is from: Home Final disposition: To be determined  Consultants: Podiatry, cardiology, vascular surgery   Microbiology summarized: COVID-19 negative MRSA PCR negative Blood cultures negative Tissue culture pending  Sch Meds:  Scheduled Meds: . diltiazem  60 mg Oral Q6H  . furosemide  40 mg Intravenous BID  . insulin aspart  0-20 Units Subcutaneous TID WC  . insulin NPH Human  40 Units Subcutaneous BID AC & HS  . losartan  25 mg Oral Daily  . multivitamin with minerals  1 tablet Oral Daily  . Ensure Max Protein  11 oz Oral BID  . sodium chloride flush  10-40 mL Intracatheter Q12H  . sodium chloride flush  3 mL Intravenous Q12H   Continuous Infusions: . sodium chloride    . heparin    . lactated ringers 10 mL/hr at 10/10/19 1138  . piperacillin-tazobactam (ZOSYN)  IV 3.375 g (10/10/19 1439)  . vancomycin 1,500 mg (10/10/19 0536)   PRN Meds:.sodium chloride, acetaminophen, ondansetron (ZOFRAN) IV, oxyCODONE-acetaminophen, sodium chloride flush, sodium chloride flush  Antimicrobials: Anti-infectives (From admission, onward)   Start     Dose/Rate Route Frequency Ordered Stop   10/10/19 0430  vancomycin (VANCOREADY)  IVPB 1500 mg/300 mL     1,500 mg 150 mL/hr over 120 Minutes Intravenous Every 12 hours 10/09/19 1003     10/09/19 1200  piperacillin-tazobactam (ZOSYN) IVPB 3.375 g     3.375 g 12.5 mL/hr over 240 Minutes Intravenous Every 8 hours 10/09/19 0946     10/09/19 1015  vancomycin (VANCOCIN) 2,500 mg in sodium chloride 0.9 % 500 mL IVPB     2,500 mg 250 mL/hr over 120 Minutes Intravenous  Once 10/09/19 1003 10/09/19 1834   10/09/19 1000  amoxicillin-clavulanate (AUGMENTIN) 875-125 MG per tablet 1 tablet  Status:  Discontinued     1 tablet Oral Every 12 hours 10/08/19 2155 10/09/19 0946   10/08/19 2145  amoxicillin-clavulanate (AUGMENTIN) 875-125 MG per tablet 1 tablet     1 tablet Oral  Once 10/08/19 2130 10/08/19 2247       I have personally reviewed the following labs and images: CBC: Recent Labs  Lab 10/08/19 1520 10/09/19 0312 10/10/19 0442  WBC 9.3 11.6* 10.7*  NEUTROABS 6.4  --   --   HGB 12.9* 13.1 11.7*  HCT 40.9 40.3 36.3*  MCV 97.8 96.6 96.5  PLT 203 267 243   BMP &GFR Recent Labs  Lab 10/08/19 1520 10/09/19 0312 10/10/19 0442  NA 135 135 136  K 4.3 4.6 3.5  CL 98 97* 97*  CO2 26 27 25   GLUCOSE 127* 104* 162*  BUN 21* 18 24*  CREATININE 1.45* 1.18 1.29*  CALCIUM 8.6* 8.4* 8.1*   Estimated Creatinine Clearance: 114.1 mL/min (A) (by C-G formula based on SCr of 1.29 mg/dL (H)). Liver & Pancreas: Recent Labs  Lab 10/08/19 1520  AST 24  ALT 27  ALKPHOS 76  BILITOT 1.1  PROT 7.2  ALBUMIN 2.8*   No results for input(s): LIPASE, AMYLASE in the last 168 hours. No results for input(s): AMMONIA in the last 168 hours. Diabetic: Recent Labs    10/08/19 2224  HGBA1C 11.2*   Recent Labs  Lab 10/09/19 2130 10/10/19 0626 10/10/19 1059 10/10/19 1300 10/10/19 1431  GLUCAP 181* 141* 98 92 111*   Cardiac Enzymes: No results for input(s): CKTOTAL, CKMB, CKMBINDEX, TROPONINI in the last 168 hours. No results for input(s): PROBNP in the last 8760  hours. Coagulation Profile: No results for input(s): INR, PROTIME in the last 168 hours. Thyroid Function Tests: No results for input(s): TSH, T4TOTAL, FREET4, T3FREE, THYROIDAB in the last 72 hours. Lipid Profile: Recent Labs    10/10/19 0442  CHOL 109  HDL 34*  LDLCALC 65  TRIG 49  CHOLHDL 3.2   Anemia Panel: No results for input(s): VITAMINB12, FOLATE, FERRITIN, TIBC, IRON, RETICCTPCT in the last 72 hours. Urine analysis:    Component Value Date/Time   COLORURINE AMBER (A) 10/01/2015 1652   APPEARANCEUR CLOUDY (A) 10/01/2015 1652   LABSPEC 1.027 10/01/2015 1652   PHURINE 5.5 10/01/2015 1652   GLUCOSEU >1000 (A) 10/01/2015 1652   HGBUR NEGATIVE 10/01/2015 1652   BILIRUBINUR NEGATIVE 10/01/2015 1652   KETONESUR NEGATIVE 10/01/2015 1652   PROTEINUR 30 (A) 10/01/2015 1652   UROBILINOGEN 1.0 07/07/2012 1544   NITRITE NEGATIVE 10/01/2015 1652   LEUKOCYTESUR NEGATIVE 10/01/2015 1652   Sepsis Labs: Invalid input(s): PROCALCITONIN, LACTICIDVEN  Microbiology: Recent Results (from the past 240 hour(s))  SARS CORONAVIRUS 2 (TAT 6-24 HRS) Nasopharyngeal Nasopharyngeal Swab     Status: None   Collection Time: 10/08/19  6:08 PM   Specimen: Nasopharyngeal Swab  Result Value Ref Range Status   SARS Coronavirus 2 NEGATIVE NEGATIVE Final    Comment: (NOTE) SARS-CoV-2 target nucleic acids are NOT DETECTED. The SARS-CoV-2 RNA is generally detectable in upper and lower respiratory specimens during the acute phase of infection. Negative results do not preclude SARS-CoV-2 infection, do not rule out co-infections with other pathogens, and should not be used as the sole basis for treatment or other patient management decisions. Negative results must be combined with clinical observations, patient history, and epidemiological information. The expected result is Negative. Fact Sheet for Patients: 10/10/19 Fact Sheet for Healthcare  Providers: HairSlick.no This test is not yet approved or cleared by the quierodirigir.com FDA and  has been authorized for detection and/or diagnosis of SARS-CoV-2 by FDA under an Emergency Use Authorization (EUA). This EUA will remain  in effect (meaning this test can be used) for the duration of the COVID-19 declaration under Section 56 4(b)(1) of the Act, 21 U.S.C. section 360bbb-3(b)(1), unless the authorization is terminated or revoked sooner. Performed at [facility], [address] [place], [place]   Blood Cultures x 2 sites     Status: None (Preliminary result)   Collection Time: 10/08/19  10:24 PM   Specimen: BLOOD  Result Value Ref Range Status   Specimen Description BLOOD RIGHT UPPER ARM  Final   Special Requests   Final    BOTTLES DRAWN AEROBIC AND ANAEROBIC Blood Culture results may not be optimal due to an inadequate volume of blood received in culture bottles   Culture   Final    NO GROWTH 2 DAYS Performed at [facility], [address] [place], [place] [address]    Report Status PENDING  Incomplete  Blood Cultures x 2 sites     Status: None (Preliminary result)   Collection Time: 10/08/19 10:45 PM   Specimen: BLOOD  Result Value Ref Range Status   Specimen Description BLOOD LEFT ARM  Final   Special Requests   Final    BOTTLES DRAWN AEROBIC AND ANAEROBIC Blood Culture results may not be optimal due to an inadequate volume of blood received in culture bottles   Culture   Final    NO GROWTH 2 DAYS Performed at [facility], [address], [place], [place] [address]    Report Status PENDING  Incomplete  MRSA PCR Screening     Status: None   Collection Time: 10/09/19  3:12 AM   Specimen: Nasopharyngeal  Result Value Ref Range Status   MRSA by PCR NEGATIVE NEGATIVE Final    Comment:        The GeneXpert MRSA Assay (FDA approved for NASAL specimens only), is one component of a comprehensive MRSA  colonization surveillance program. It is not intended to diagnose MRSA infection nor to guide or monitor treatment for MRSA infections. Performed at [facility], [address], [place], [place] [address]     Radiology Studies: ECHOCARDIOGRAM COMPLETE  Result Date: 10/09/2019    ECHOCARDIOGRAM REPORT   Patient Name:   Maurice Nguyen Date of Exam: 10/09/2019 Medical Rec #:  [id number]          Height:       74.0 in Accession #:    [id number]         Weight:       384.3 lb Date of Birth:  [birth year]           BSA:          2.869 m Patient Age:    53 years           BP:           112/72 mmHg Patient Gender: M                  HR:           94 bpm. Exam Location:  Inpatient Procedure: 2D Echo, Cardiac Doppler and Color Doppler Indications:    Atrial Fibrillation 427.31  History:        Patient has prior history of Echocardiogram examinations, most                 recent 01/31/2015. Arrythmias:Atrial Fibrillation; Risk                 Factors:Hypertension, Diabetes, Dyslipidemia and Non-Smoker.  Sonographer:    Renella Cunas RDCS Referring Phys: [phone] JARED M GARDNER  Sonographer Comments: Patient is morbidly obese and suboptimal apical window. Image acquisition challenging due to uncooperative patient. Patient refused definity. IMPRESSIONS  1. Left ventricular ejection fraction, by estimation, is 30 to 35%. The left ventricle has moderately decreased function. The left ventricle demonstrates global hypokinesis. The  left ventricular internal cavity size was mildly dilated. Left ventricular diastolic parameters are indeterminate.  2. Right ventricular systolic function was not well visualized. The right ventricular size is not well visualized.  3. Left atrial size was moderately dilated.  4. The mitral valve is abnormal. Trivial mitral valve regurgitation.  5. The aortic valve is tricuspid. Aortic valve regurgitation is not visualized.  6. Aortic dilatation noted. There is mild dilatation of the ascending  aorta measuring 39 mm.  7. The inferior vena cava is dilated in size with <50% respiratory variability, suggesting right atrial pressure of 15 mmHg. FINDINGS  Left Ventricle: Left ventricular ejection fraction, by estimation, is 30 to 35%. The left ventricle has moderately decreased function. The left ventricle demonstrates global hypokinesis. The left ventricular internal cavity size was mildly dilated. There is no left ventricular hypertrophy. Left ventricular diastolic parameters are indeterminate. Right Ventricle: The right ventricular size is not well visualized. Right vetricular wall thickness was not assessed. Right ventricular systolic function was not well visualized. Left Atrium: Left atrial size was moderately dilated. Right Atrium: Right atrial size was normal in size. Pericardium: There is no evidence of pericardial effusion. Mitral Valve: The mitral valve is abnormal. Mild mitral annular calcification. Trivial mitral valve regurgitation. Tricuspid Valve: The tricuspid valve is not well visualized. Tricuspid valve regurgitation is trivial. Aortic Valve: The aortic valve is tricuspid. Aortic valve regurgitation is not visualized. Pulmonic Valve: The pulmonic valve was normal in structure. Pulmonic valve regurgitation is not visualized. Aorta: Aortic dilatation noted. There is mild dilatation of the ascending aorta measuring 39 mm. Venous: The inferior vena cava is dilated in size with less than 50% respiratory variability, suggesting right atrial pressure of 15 mmHg. IAS/Shunts: The interatrial septum was not well visualized.  LEFT VENTRICLE PLAX 2D LVIDd:         5.79 cm LVIDs:         4.88 cm LV PW:         0.95 cm LV IVS:        0.95 cm LVOT diam:     2.60 cm LV SV:         70 LV SV Index:   24 LVOT Area:     5.31 cm  RIGHT VENTRICLE TAPSE (M-mode): 1.4 cm LEFT ATRIUM              Index       RIGHT ATRIUM           Index LA diam:        5.10 cm  1.78 cm/m  RA Area:     26.80 cm LA Vol (A2C):   94.6  ml  32.98 ml/m RA Volume:   92.80 ml  32.35 ml/m LA Vol (A4C):   109.0 ml 38.00 ml/m LA Biplane Vol: 106.0 ml 36.95 ml/m  AORTIC VALVE LVOT Vmax:   77.00 cm/s LVOT Vmean:  50.700 cm/s LVOT VTI:    0.131 m  AORTA Ao Root diam: 4.00 cm Ao Asc diam:  3.90 cm  SHUNTS Systemic VTI:  0.13 m Systemic Diam: 2.60 cm Lyman Bishop MD Electronically signed by Lyman Bishop MD Signature Date/Time: 10/09/2019/3:43:30 PM    Final      Yanixan Mellinger T. [place]  If 7PM-7AM, please contact night-coverage [email or web address] Password [facility] 10/10/2019, 2:57 PM

## 2019-10-10 NOTE — Progress Notes (Signed)
ANTICOAGULATION CONSULT NOTE  Pharmacy Consult for heparin Indication: atrial fibrillation  No Known Allergies  Patient Measurements: Height: 6\' 2"  (188 cm) Weight: (!) 384 lb 4.2 oz (174.3 kg) IBW/kg (Calculated) : 82.2 Heparin Dosing Weight: 130kg  Vital Signs: Temp: 98.2 F (36.8 C) (03/17 0135) Temp Source: Oral (03/17 0135) BP: 106/55 (03/17 0135) Pulse Rate: 97 (03/17 0135)  Labs: Recent Labs    10/08/19 1520 10/09/19 0312 10/09/19 1618 10/10/19 0107  HGB 12.9* 13.1  --   --   HCT 40.9 40.3  --   --   PLT 203 267  --   --   HEPARINUNFRC  --  <0.10* <0.10* 0.23*  CREATININE 1.45* 1.18  --   --     Estimated Creatinine Clearance: 119.1 mL/min (by C-G formula based on SCr of 1.18 mg/dL).  Assessment: 54 y.o. male with Afib for heparin  Goal of Therapy:  Heparin level 0.3-0.7 units/ml Monitor platelets by anticoagulation protocol: Yes   Plan:  Heparin 4000 units IV bolus, then increase heparin 3150 units/hr Check heparin level in 6 hours.   53, PharmD, BCPS  10/10/2019, 3:17 AM

## 2019-10-10 NOTE — Anesthesia Preprocedure Evaluation (Signed)
Anesthesia Evaluation  Patient identified by MRN, date of birth, ID band Patient awake    Reviewed: Allergy & Precautions, NPO status , Patient's Chart, lab work & pertinent test results  History of Anesthesia Complications Negative for: history of anesthetic complications  Airway Mallampati: III  TM Distance: >3 FB Neck ROM: Full    Dental  (+) Teeth Intact   Pulmonary sleep apnea ,    Pulmonary exam normal        Cardiovascular hypertension, +CHF  Normal cardiovascular exam+ dysrhythmias Atrial Fibrillation      Neuro/Psych negative neurological ROS  negative psych ROS   GI/Hepatic negative GI ROS, Neg liver ROS,   Endo/Other  diabetes, Type 2Morbid obesity  Renal/GU Renal InsufficiencyRenal disease (CKDII)  negative genitourinary   Musculoskeletal negative musculoskeletal ROS (+)   Abdominal (+) + obese,   Peds  Hematology  (+) anemia ,   Anesthesia Other Findings   Reproductive/Obstetrics                            Anesthesia Physical Anesthesia Plan  ASA: IV  Anesthesia Plan: MAC   Post-op Pain Management:    Induction: Intravenous  PONV Risk Score and Plan: 1 and Propofol infusion, TIVA and Treatment may vary due to age or medical condition  Airway Management Planned: Natural Airway, Nasal Cannula and Simple Face Mask  Additional Equipment: None  Intra-op Plan:   Post-operative Plan:   Informed Consent: I have reviewed the patients History and Physical, chart, labs and discussed the procedure including the risks, benefits and alternatives for the proposed anesthesia with the patient or authorized representative who has indicated his/her understanding and acceptance.       Plan Discussed with:   Anesthesia Plan Comments:         Anesthesia Quick Evaluation

## 2019-10-10 NOTE — Progress Notes (Signed)
Patient refused CPAP.

## 2019-10-10 NOTE — Progress Notes (Signed)
Progress Note  Patient Name: Maurice Nguyen Date of Encounter: 10/10/2019  Primary Cardiologist: Kristeen Miss, MD   Subjective   Did well with foot surgery.  No SHOB.  Inpatient Medications    Scheduled Meds: . diltiazem  60 mg Oral Q6H  . furosemide  40 mg Intravenous BID  . insulin aspart  0-20 Units Subcutaneous TID WC  . insulin NPH Human  40 Units Subcutaneous BID AC & HS  . losartan  25 mg Oral Daily  . multivitamin with minerals  1 tablet Oral Daily  . Ensure Max Protein  11 oz Oral BID  . sodium chloride flush  10-40 mL Intracatheter Q12H  . sodium chloride flush  3 mL Intravenous Q12H   Continuous Infusions: . sodium chloride    . heparin 3,150 Units/hr (10/10/19 1741)  . lactated ringers 10 mL/hr at 10/10/19 1138  . piperacillin-tazobactam (ZOSYN)  IV 3.375 g (10/10/19 2110)  . vancomycin 1,500 mg (10/10/19 1751)   PRN Meds: sodium chloride, acetaminophen, ondansetron (ZOFRAN) IV, oxyCODONE-acetaminophen, sodium chloride flush, sodium chloride flush   Vital Signs    Vitals:   10/10/19 1315 10/10/19 1330 10/10/19 1353 10/10/19 2058  BP: 134/75 137/79 (!) 143/88 135/75  Pulse: 78 82 84 86  Resp: 19 (!) 21 11 20   Temp:  97.6 F (36.4 C) 98 F (36.7 C) 99.7 F (37.6 C)  TempSrc:   Oral Oral  SpO2: 100% 100% 99% 96%  Weight:      Height:        Intake/Output Summary (Last 24 hours) at 10/10/2019 2344 Last data filed at 10/10/2019 2200 Gross per 24 hour  Intake 2017.98 ml  Output 4570 ml  Net -2552.02 ml   Last 3 Weights 10/10/2019 10/10/2019 10/09/2019  Weight (lbs) 415 lb 5.6 oz 415 lb 5.6 oz 384 lb 4.2 oz  Weight (kg) 188.4 kg 188.4 kg 174.3 kg      Telemetry    AFib - Personally Reviewed  ECG    None today - Personally Reviewed  Physical Exam   GEN: No acute distress.   Neck: No JVD Cardiac: irregularly irregular, no murmurs, rubs, or gallops.  Respiratory: Clear to auscultation bilaterally. GI: Soft, nontender, non-distended    MS: left foot bandaged Neuro:  Nonfocal  Psych: Normal affect   Labs    High Sensitivity Troponin:  No results for input(s): TROPONINIHS in the last 720 hours.    Chemistry Recent Labs  Lab 10/08/19 1520 10/09/19 0312 10/10/19 0442  NA 135 135 136  K 4.3 4.6 3.5  CL 98 97* 97*  CO2 26 27 25   GLUCOSE 127* 104* 162*  BUN 21* 18 24*  CREATININE 1.45* 1.18 1.29*  CALCIUM 8.6* 8.4* 8.1*  PROT 7.2  --   --   ALBUMIN 2.8*  --   --   AST 24  --   --   ALT 27  --   --   ALKPHOS 76  --   --   BILITOT 1.1  --   --   GFRNONAA 54* >60 >60  GFRAA >60 >60 >60  ANIONGAP 11 11 14      Hematology Recent Labs  Lab 10/08/19 1520 10/09/19 0312 10/10/19 0442  WBC 9.3 11.6* 10.7*  RBC 4.18* 4.17* 3.76*  HGB 12.9* 13.1 11.7*  HCT 40.9 40.3 36.3*  MCV 97.8 96.6 96.5  MCH 30.9 31.4 31.1  MCHC 31.5 32.5 32.2  RDW 13.4 13.5 13.5  PLT 203 267 243  BNP Recent Labs  Lab 10/09/19 1755  BNP 320.8*     DDimer No results for input(s): DDIMER in the last 168 hours.   Radiology    ECHOCARDIOGRAM COMPLETE  Result Date: 10/09/2019    ECHOCARDIOGRAM REPORT   Patient Name:   Maurice Nguyen Date of Exam: 10/09/2019 Medical Rec #:  [id number]          Height:       74.0 in Accession #:    [id number]         Weight:       384.3 lb Date of Birth:  1966/03/07           BSA:          2.869 m Patient Age:    54 years           BP:           112/72 mmHg Patient Gender: M                  HR:           94 bpm. Exam Location:  Inpatient Procedure: 2D Echo, Cardiac Doppler and Color Doppler Indications:    Atrial Fibrillation 427.31  History:        Patient has prior history of Echocardiogram examinations, most                 recent 01/31/2015. Arrythmias:Atrial Fibrillation; Risk                 Factors:Hypertension, Diabetes, Dyslipidemia and Non-Smoker.  Sonographer:    Vickie Epley RDCS Referring Phys: [id number] JARED M GARDNER  Sonographer Comments: Patient is morbidly obese and suboptimal apical  window. Image acquisition challenging due to uncooperative patient. Patient refused definity. IMPRESSIONS  1. Left ventricular ejection fraction, by estimation, is 30 to 35%. The left ventricle has moderately decreased function. The left ventricle demonstrates global hypokinesis. The left ventricular internal cavity size was mildly dilated. Left ventricular diastolic parameters are indeterminate.  2. Right ventricular systolic function was not well visualized. The right ventricular size is not well visualized.  3. Left atrial size was moderately dilated.  4. The mitral valve is abnormal. Trivial mitral valve regurgitation.  5. The aortic valve is tricuspid. Aortic valve regurgitation is not visualized.  6. Aortic dilatation noted. There is mild dilatation of the ascending aorta measuring 39 mm.  7. The inferior vena cava is dilated in size with <50% respiratory variability, suggesting right atrial pressure of 15 mmHg. FINDINGS  Left Ventricle: Left ventricular ejection fraction, by estimation, is 30 to 35%. The left ventricle has moderately decreased function. The left ventricle demonstrates global hypokinesis. The left ventricular internal cavity size was mildly dilated. There is no left ventricular hypertrophy. Left ventricular diastolic parameters are indeterminate. Right Ventricle: The right ventricular size is not well visualized. Right vetricular wall thickness was not assessed. Right ventricular systolic function was not well visualized. Left Atrium: Left atrial size was moderately dilated. Right Atrium: Right atrial size was normal in size. Pericardium: There is no evidence of pericardial effusion. Mitral Valve: The mitral valve is abnormal. Mild mitral annular calcification. Trivial mitral valve regurgitation. Tricuspid Valve: The tricuspid valve is not well visualized. Tricuspid valve regurgitation is trivial. Aortic Valve: The aortic valve is tricuspid. Aortic valve regurgitation is not visualized.  Pulmonic Valve: The pulmonic valve was normal in structure. Pulmonic valve regurgitation is not visualized. Aorta: Aortic dilatation noted. There is mild dilatation of the ascending  aorta measuring 39 mm. Venous: The inferior vena cava is dilated in size with less than 50% respiratory variability, suggesting right atrial pressure of 15 mmHg. IAS/Shunts: The interatrial septum was not well visualized.  LEFT VENTRICLE PLAX 2D LVIDd:         5.79 cm LVIDs:         4.88 cm LV PW:         0.95 cm LV IVS:        0.95 cm LVOT diam:     2.60 cm LV SV:         70 LV SV Index:   24 LVOT Area:     5.31 cm  RIGHT VENTRICLE TAPSE (M-mode): 1.4 cm LEFT ATRIUM              Index       RIGHT ATRIUM           Index LA diam:        5.10 cm  1.78 cm/m  RA Area:     26.80 cm LA Vol (A2C):   94.6 ml  32.98 ml/m RA Volume:   92.80 ml  32.35 ml/m LA Vol (A4C):   109.0 ml 38.00 ml/m LA Biplane Vol: 106.0 ml 36.95 ml/m  AORTIC VALVE LVOT Vmax:   77.00 cm/s LVOT Vmean:  50.700 cm/s LVOT VTI:    0.131 m  AORTA Ao Root diam: 4.00 cm Ao Asc diam:  3.90 cm  SHUNTS Systemic VTI:  0.13 m Systemic Diam: 2.60 cm Zoila Shutter MD Electronically signed by Zoila Shutter MD Signature Date/Time: 10/09/2019/3:43:30 PM    Final     Cardiac Studies   Echo showed decreased LVEF  Patient Profile     54 y.o. male with EF 30-35%  Assessment & Plan    Cardiomyopathy: [plan for cath tomorrow to see if ischemic cardiomyopathy.  D/w Dr. Clifton James who will do the procedure at the time of the LE angio by Dr. Myra Gianotti, in OR room 16.  Procedure explained to the family.  All questions answered.  Nurse to have consent form signed tonight.  Rate control for AFib.  Will need long term anticoagulation.       For questions or updates, please contact [facility] Please consult [email or web address] for contact info under        Signed, Lance Muss, MD  10/10/2019, 11:44 PM

## 2019-10-10 NOTE — Progress Notes (Signed)
ANTICOAGULATION CONSULT NOTE  Pharmacy Consult for heparin Indication: atrial fibrillation  No Known Allergies  Patient Measurements: Height: 6\' 2"  (188 cm) Weight: (!) 415 lb 5.6 oz (188.4 kg) IBW/kg (Calculated) : 82.2 Heparin Dosing Weight: 130kg  Vital Signs: Temp: (P) 97.5 F (36.4 C) (03/17 1300) Temp Source: Oral (03/17 1025) BP: (P) 121/81 (03/17 1300) Pulse Rate: (P) 84 (03/17 1300)  Labs: Recent Labs    10/08/19 1520 10/08/19 1520 10/09/19 0312 10/09/19 1618 10/10/19 0107 10/10/19 0441 10/10/19 0442 10/10/19 1043  HGB 12.9*   < > 13.1  --   --   --  11.7*  --   HCT 40.9  --  40.3  --   --   --  36.3*  --   PLT 203  --  267  --   --   --  243  --   HEPARINUNFRC  --   --  <0.10*   < > 0.23* 0.63  --  0.55  CREATININE 1.45*  --  1.18  --   --   --  1.29*  --    < > = values in this interval not displayed.    Estimated Creatinine Clearance: 114.1 mL/min (A) (by C-G formula based on SCr of 1.29 mg/dL (H)).  Assessment: 43 yoF with hx AFib admitted with RVR. Pt previously on Xarelto but has not been taking recently. Pt started on IV heparin with need for various procedures.   I&D in OR today, heparin held but per podiatry can be restarted in 4h.  Goal of Therapy:  Heparin level 0.3-0.7 units/ml Monitor platelets by anticoagulation protocol: Yes   Plan:  -Restart heparin no bolus at 3150 units/h at 1700 -Check heparin level 8hr after restart  53, PharmD, BCPS Clinical Pharmacist [phone] Please check AMION for all [facility] Pharmacy numbers 10/10/2019

## 2019-10-10 NOTE — Progress Notes (Signed)
Patient refusing CPAP at this time

## 2019-10-10 NOTE — Anesthesia Postprocedure Evaluation (Signed)
Anesthesia Post Note  Patient: Maurice Nguyen  Procedure(s) Performed: DEBRIDEMENT WOUND (Left )     Patient location during evaluation: PACU Anesthesia Type: MAC Level of consciousness: awake and alert Pain management: pain level controlled Vital Signs Assessment: post-procedure vital signs reviewed and stable Respiratory status: spontaneous breathing, nonlabored ventilation and respiratory function stable Cardiovascular status: blood pressure returned to baseline and stable Postop Assessment: no apparent nausea or vomiting Anesthetic complications: no    Last Vitals:  Vitals:   10/10/19 1330 10/10/19 1353  BP: 137/79 (!) 143/88  Pulse: 82 84  Resp: (!) 21 11  Temp: 36.4 C   SpO2: 100% 99%    Last Pain:  Vitals:   10/10/19 1330  TempSrc:   PainSc: 0-No pain                 Lidia Collum

## 2019-10-10 NOTE — Progress Notes (Signed)
Pt scheduled for angiogram tomorrow.   Discussed with pt and questions answered.    Npo after MN/consent.  Doreatha Massed, [facility] 10/10/2019 9:47 AM

## 2019-10-11 ENCOUNTER — Inpatient Hospital Stay (HOSPITAL_COMMUNITY): Payer: Medicare HMO

## 2019-10-11 ENCOUNTER — Encounter: Payer: Medicare HMO | Admitting: Vascular Surgery

## 2019-10-11 ENCOUNTER — Ambulatory Visit (HOSPITAL_COMMUNITY): Admission: RE | Admit: 2019-10-11 | Payer: Medicare HMO | Source: Home / Self Care | Admitting: Surgery

## 2019-10-11 ENCOUNTER — Inpatient Hospital Stay (HOSPITAL_COMMUNITY): Payer: Medicare HMO | Admitting: Registered Nurse

## 2019-10-11 ENCOUNTER — Encounter (HOSPITAL_COMMUNITY)
Admission: EM | Disposition: A | Discharge status: Skilled Nursing Facility | Payer: Self-pay | Source: Home / Self Care | Attending: Student

## 2019-10-11 ENCOUNTER — Encounter (HOSPITAL_COMMUNITY): Admission: RE | Payer: Self-pay | Source: Home / Self Care

## 2019-10-11 DIAGNOSIS — I2510 Atherosclerotic heart disease of native coronary artery without angina pectoris: Secondary | ICD-10-CM

## 2019-10-11 DIAGNOSIS — I428 Other cardiomyopathies: Secondary | ICD-10-CM

## 2019-10-11 HISTORY — PX: CORONARY STENT PLACEMENT: SHX6853

## 2019-10-11 HISTORY — PX: LOWER EXTREMITY ANGIOGRAM: SHX5508

## 2019-10-11 LAB — RENAL FUNCTION PANEL
Albumin: 2.4 g/dL — ABNORMAL LOW (ref 3.5–5.0)
Anion gap: 9 (ref 5–15)
BUN: 24 mg/dL — ABNORMAL HIGH (ref 6–20)
CO2: 27 mmol/L (ref 22–32)
Calcium: 8.2 mg/dL — ABNORMAL LOW (ref 8.9–10.3)
Chloride: 101 mmol/L (ref 98–111)
Creatinine, Ser: 1.28 mg/dL — ABNORMAL HIGH (ref 0.61–1.24)
GFR calc Af Amer: >60 mL/min (ref >60)
GFR calc non Af Amer: >60 mL/min (ref >60)
Glucose, Bld: 190 mg/dL — ABNORMAL HIGH (ref 70–99)
Phosphorus: 3.8 mg/dL (ref 2.5–4.6)
Potassium: 3.9 mmol/L (ref 3.5–5.1)
Sodium: 137 mmol/L (ref 135–145)

## 2019-10-11 LAB — CBC
HCT: 36.2 % — ABNORMAL LOW (ref 39.0–52.0)
Hemoglobin: 11.7 g/dL — ABNORMAL LOW (ref 13.0–17.0)
MCH: 31.1 pg (ref 26.0–34.0)
MCHC: 32.3 g/dL (ref 30.0–36.0)
MCV: 96.3 fL (ref 80.0–100.0)
Platelets: 250 10*3/uL (ref 150–400)
RBC: 3.76 MIL/uL — ABNORMAL LOW (ref 4.22–5.81)
RDW: 13.3 % (ref 11.5–15.5)
WBC: 8.8 10*3/uL (ref 4.0–10.5)
nRBC: 0 % (ref 0.0–0.2)

## 2019-10-11 LAB — PROTIME-INR
INR: 1.3 — ABNORMAL HIGH (ref 0.8–1.2)
Prothrombin Time: 16.2 seconds — ABNORMAL HIGH (ref 11.4–15.2)

## 2019-10-11 LAB — HEPARIN LEVEL (UNFRACTIONATED): Heparin Unfractionated: 0.11 IU/mL — ABNORMAL LOW (ref 0.30–0.70)

## 2019-10-11 LAB — GLUCOSE, CAPILLARY
Glucose-Capillary: 151 mg/dL — ABNORMAL HIGH (ref 70–99)
Glucose-Capillary: 114 mg/dL — ABNORMAL HIGH (ref 70–99)
Glucose-Capillary: 96 mg/dL (ref 70–99)
Glucose-Capillary: 123 mg/dL — ABNORMAL HIGH (ref 70–99)
Glucose-Capillary: 171 mg/dL — ABNORMAL HIGH (ref 70–99)
Glucose-Capillary: 288 mg/dL — ABNORMAL HIGH (ref 70–99)

## 2019-10-11 LAB — MAGNESIUM: Magnesium: 2 mg/dL (ref 1.7–2.4)

## 2019-10-11 SURGERY — ABDOMINAL AORTOGRAM W/LOWER EXTREMITY
Anesthesia: LOCAL

## 2019-10-11 SURGERY — ANGIOGRAM, LOWER EXTREMITY
Anesthesia: General | Laterality: Left

## 2019-10-11 MED ORDER — LIDOCAINE 2% (20 MG/ML) 5 ML SYRINGE
INTRAMUSCULAR | Status: DC | PRN
  Administered 2019-10-11: 100 mg via INTRAVENOUS

## 2019-10-11 MED ORDER — PROPOFOL 10 MG/ML IV BOLUS
INTRAVENOUS | Status: DC | PRN
  Administered 2019-10-11: 30 mg via INTRAVENOUS
  Administered 2019-10-11: 20 mg via INTRAVENOUS
  Administered 2019-10-11 (×2): 30 mg via INTRAVENOUS

## 2019-10-11 MED ORDER — IODIXANOL 320 MG/ML IV SOLN
INTRAVENOUS | Status: DC | PRN
  Administered 2019-10-11: 141 mL via INTRA_ARTERIAL

## 2019-10-11 MED ORDER — SUGAMMADEX SODIUM 200 MG/2ML IV SOLN
INTRAVENOUS | Status: DC | PRN
  Administered 2019-10-11: 100 mg via INTRAVENOUS
  Administered 2019-10-11 (×2): 50 mg via INTRAVENOUS
  Administered 2019-10-11: 100 mg via INTRAVENOUS
  Administered 2019-10-11 (×2): 50 mg via INTRAVENOUS

## 2019-10-11 MED ORDER — SODIUM CHLORIDE 0.9 % IV SOLN
INTRAVENOUS | Status: AC
  Filled 2019-10-11 (×2): qty 1.2

## 2019-10-11 MED ORDER — LACTATED RINGERS IV SOLN: INTRAVENOUS | Status: DC

## 2019-10-11 MED ORDER — MIDAZOLAM HCL 5 MG/5ML IJ SOLN
INTRAMUSCULAR | Status: DC | PRN
  Administered 2019-10-11: 1 mg via INTRAVENOUS

## 2019-10-11 MED ORDER — ONDANSETRON HCL 4 MG/2ML IJ SOLN
INTRAMUSCULAR | Status: AC
  Filled 2019-10-11: qty 2

## 2019-10-11 MED ORDER — PHENYLEPHRINE HCL-NACL 10-0.9 MG/250ML-% IV SOLN
INTRAVENOUS | Status: DC | PRN
  Administered 2019-10-11: 30 ug/min via INTRAVENOUS

## 2019-10-11 MED ORDER — INSULIN NPH (HUMAN) (ISOPHANE) 100 UNIT/ML ~~LOC~~ SUSP
40.0000 [IU] | Freq: Two times a day (BID) | SUBCUTANEOUS | Status: DC
  Administered 2019-10-11 – 2019-10-15 (×10): 40 [IU] via SUBCUTANEOUS

## 2019-10-11 MED ORDER — HEPARIN (PORCINE) 25000 UT/250ML-% IV SOLN
3000.0000 [IU]/h | INTRAVENOUS | Status: AC
  Administered 2019-10-11 – 2019-10-12 (×4): 3150 [IU]/h via INTRAVENOUS
  Administered 2019-10-12: 2000 [IU]/h via INTRAVENOUS
  Administered 2019-10-13: 3100 [IU]/h via INTRAVENOUS
  Administered 2019-10-13: 3150 [IU]/h via INTRAVENOUS
  Administered 2019-10-14 – 2019-10-15 (×2): 3100 [IU]/h via INTRAVENOUS
  Administered 2019-10-15 – 2019-10-16 (×3): 3000 [IU]/h via INTRAVENOUS
  Filled 2019-10-11 (×13): qty 250

## 2019-10-11 MED ORDER — ONDANSETRON HCL 4 MG/2ML IJ SOLN
INTRAMUSCULAR | Status: DC | PRN
  Administered 2019-10-11: 4 mg via INTRAVENOUS

## 2019-10-11 MED ORDER — ROCURONIUM BROMIDE 10 MG/ML (PF) SYRINGE
PREFILLED_SYRINGE | INTRAVENOUS | Status: AC
  Filled 2019-10-11: qty 20

## 2019-10-11 MED ORDER — ROCURONIUM BROMIDE 10 MG/ML (PF) SYRINGE
PREFILLED_SYRINGE | INTRAVENOUS | Status: AC
  Filled 2019-10-11: qty 10

## 2019-10-11 MED ORDER — SUCCINYLCHOLINE CHLORIDE 200 MG/10ML IV SOSY
PREFILLED_SYRINGE | INTRAVENOUS | Status: DC | PRN
  Administered 2019-10-11: 160 mg via INTRAVENOUS

## 2019-10-11 MED ORDER — DEXMEDETOMIDINE HCL 200 MCG/2ML IV SOLN
INTRAVENOUS | Status: DC | PRN
  Administered 2019-10-11 (×2): 8 ug via INTRAVENOUS

## 2019-10-11 MED ORDER — INSULIN ASPART 100 UNIT/ML ~~LOC~~ SOLN
0.0000 [IU] | Freq: Three times a day (TID) | SUBCUTANEOUS | Status: DC
  Administered 2019-10-11 (×2): 4 [IU] via SUBCUTANEOUS
  Administered 2019-10-12 (×2): 11 [IU] via SUBCUTANEOUS

## 2019-10-11 MED ORDER — SODIUM CHLORIDE 0.9 % IV SOLN
INTRAVENOUS | Status: DC | PRN
  Administered 2019-10-11 (×2): 500 mL

## 2019-10-11 MED ORDER — MIDAZOLAM HCL 2 MG/2ML IJ SOLN
INTRAMUSCULAR | Status: AC
  Filled 2019-10-11: qty 2

## 2019-10-11 MED ORDER — FENTANYL CITRATE (PF) 250 MCG/5ML IJ SOLN
INTRAMUSCULAR | Status: AC
  Filled 2019-10-11: qty 5

## 2019-10-11 MED ORDER — PHENYLEPHRINE 40 MCG/ML (10ML) SYRINGE FOR IV PUSH (FOR BLOOD PRESSURE SUPPORT)
PREFILLED_SYRINGE | INTRAVENOUS | Status: DC | PRN
  Administered 2019-10-11: 80 ug via INTRAVENOUS
  Administered 2019-10-11: 40 ug via INTRAVENOUS

## 2019-10-11 MED ORDER — FENTANYL CITRATE (PF) 250 MCG/5ML IJ SOLN
INTRAMUSCULAR | Status: DC | PRN
  Administered 2019-10-11: 75 ug via INTRAVENOUS

## 2019-10-11 MED ORDER — LIDOCAINE 2% (20 MG/ML) 5 ML SYRINGE
INTRAMUSCULAR | Status: AC
  Filled 2019-10-11: qty 5

## 2019-10-11 MED ORDER — MENTHOL 3 MG MT LOZG
1.0000 | LOZENGE | OROMUCOSAL | Status: DC | PRN
  Administered 2019-10-11: 3 mg via ORAL
  Filled 2019-10-11: qty 9

## 2019-10-11 MED ORDER — ESMOLOL HCL 100 MG/10ML IV SOLN
INTRAVENOUS | Status: AC
  Filled 2019-10-11: qty 10

## 2019-10-11 MED ORDER — DEXAMETHASONE SODIUM PHOSPHATE 10 MG/ML IJ SOLN
INTRAMUSCULAR | Status: AC
  Filled 2019-10-11: qty 1

## 2019-10-11 MED ORDER — DEXAMETHASONE SODIUM PHOSPHATE 10 MG/ML IJ SOLN
INTRAMUSCULAR | Status: DC | PRN
  Administered 2019-10-11: 4 mg via INTRAVENOUS

## 2019-10-11 MED ORDER — ROCURONIUM BROMIDE 10 MG/ML (PF) SYRINGE
PREFILLED_SYRINGE | INTRAVENOUS | Status: DC | PRN
  Administered 2019-10-11: 20 mg via INTRAVENOUS
  Administered 2019-10-11: 50 mg via INTRAVENOUS
  Administered 2019-10-11 (×2): 20 mg via INTRAVENOUS

## 2019-10-11 SURGICAL SUPPLY — 41 items
ADH SKN CLS APL DERMABOND .7 (GAUZE/BANDAGES/DRESSINGS) ×2
APL PRP STRL LF DISP 70% ISPRP (MISCELLANEOUS)
BAG SNAP BAND KOVER 36X36 (MISCELLANEOUS) ×2 IMPLANT
BLADE SURG 11 STRL SS (BLADE) ×4 IMPLANT
CANISTER SUCT 3000ML PPV (MISCELLANEOUS) ×2 IMPLANT
CATH ANGIO 5F BER2 65CM (CATHETERS) IMPLANT
CATH OMNI FLUSH .035X70CM (CATHETERS) IMPLANT
CATH OMNI FLUSH 5F 65CM (CATHETERS) ×2 IMPLANT
CHLORAPREP W/TINT 26 (MISCELLANEOUS) IMPLANT
CLOSURE MYNX CONTROL 5F (Vascular Products) ×2 IMPLANT
COVER SURGICAL LIGHT HANDLE (MISCELLANEOUS) ×4 IMPLANT
COVER WAND RF STERILE (DRAPES) ×4 IMPLANT
DERMABOND ADVANCED (GAUZE/BANDAGES/DRESSINGS) ×2
DERMABOND ADVANCED .7 DNX12 (GAUZE/BANDAGES/DRESSINGS) ×4 IMPLANT
DEVICE TORQUE H2O (MISCELLANEOUS) IMPLANT
GAUZE 4X4 16PLY RFD (DISPOSABLE) ×4 IMPLANT
GLOVE BIOGEL PI IND STRL 7.5 (GLOVE) ×2 IMPLANT
GLOVE BIOGEL PI INDICATOR 7.5 (GLOVE) ×2
GLOVE SURG SS PI 7.5 STRL IVOR (GLOVE) ×4 IMPLANT
GOWN STRL REUS W/ TWL LRG LVL3 (GOWN DISPOSABLE) ×4 IMPLANT
GOWN STRL REUS W/ TWL XL LVL3 (GOWN DISPOSABLE) ×2 IMPLANT
GOWN STRL REUS W/TWL LRG LVL3 (GOWN DISPOSABLE) ×8
GOWN STRL REUS W/TWL XL LVL3 (GOWN DISPOSABLE) ×4
GUIDEWIRE ANGLED .035X150CM (WIRE) IMPLANT
KIT BASIN OR (CUSTOM PROCEDURE TRAY) ×4 IMPLANT
KIT HEART LEFT (KITS) ×2 IMPLANT
KIT TURNOVER KIT B (KITS) ×4 IMPLANT
NDL PERC 18GX7CM (NEEDLE) ×2 IMPLANT
NEEDLE PERC 18GX7CM (NEEDLE) IMPLANT
NS IRRIG 1000ML POUR BTL (IV SOLUTION) IMPLANT
PACK ENDO MINOR (CUSTOM PROCEDURE TRAY) ×2 IMPLANT
PAD ARMBOARD 7.5X6 YLW CONV (MISCELLANEOUS) ×8 IMPLANT
SET MICROPUNCTURE 5F STIFF (MISCELLANEOUS) ×6 IMPLANT
SHEATH AVANTI 11CM 5FR (SHEATH) ×2 IMPLANT
STOPCOCK MORSE 400PSI 3WAY (MISCELLANEOUS) ×4 IMPLANT
SYR 10ML LL (SYRINGE) ×12 IMPLANT
TOWEL GREEN STERILE (TOWEL DISPOSABLE) ×8 IMPLANT
TRANSDUCER DISP STR W/STOPCOCK (MISCELLANEOUS) ×2 IMPLANT
TUBING INJECTOR 48 (MISCELLANEOUS) ×2 IMPLANT
WIRE BENTSON .035X145CM (WIRE) ×4 IMPLANT
WIRE EMERALD 3MM-J .035X150CM (WIRE) ×2 IMPLANT

## 2019-10-11 NOTE — Progress Notes (Signed)
ANTICOAGULATION CONSULT NOTE   Pharmacy Consult for heparin Indication: atrial fibrillation  No Known Allergies  Patient Measurements: Height: 6\' 2"  (188 cm) Weight: (!) 414 lb 7.4 oz (188 kg) IBW/kg (Calculated) : 82.2 Heparin Dosing Weight: 130kg  Vital Signs: Temp: 98.7 F (37.1 C) (03/18 1623) Temp Source: Oral (03/18 1623) BP: 138/99 (03/18 1757) Pulse Rate: 68 (03/18 1623)  Labs: Recent Labs    10/09/19 0312 10/09/19 0312 10/09/19 1618 10/10/19 0441 10/10/19 0442 10/10/19 1043 10/11/19 0216 10/11/19 0356  HGB 13.1   < >  --   --  11.7*  --   --  11.7*  HCT 40.3  --   --   --  36.3*  --   --  36.2*  PLT 267  --   --   --  243  --   --  250  LABPROT  --   --   --   --   --   --   --  16.2*  INR  --   --   --   --   --   --   --  1.3*  HEPARINUNFRC <0.10*  --    < > 0.63  --  0.55 0.11*  --   CREATININE 1.18  --   --   --  1.29*  --   --  1.28*   < > = values in this interval not displayed.    Estimated Creatinine Clearance: 114.8 mL/min (A) (by C-G formula based on SCr of 1.28 mg/dL (H)).  Assessment: 41 yoF with hx AFib admitted with RVR. Pt previously on Xarelto but has not been taking recently. Pt started on IV heparin with need for various procedures.   S/p cath lab and angiography with vascular  Ok to resume heparin tonight  Goal of Therapy:  Heparin level 0.3-0.7 units/ml Monitor platelets by anticoagulation protocol: Yes   Plan:  Resume heparin 3150 units/hr at 2000 Initial hep lvl at 0200  53, PharmD, BCPS, BCCCP Clinical Pharmacist [phone]  Please check AMION for all [facility] Pharmacy numbers  10/11/2019 7:25 PM

## 2019-10-11 NOTE — Anesthesia Preprocedure Evaluation (Addendum)
Anesthesia Evaluation  Patient identified by MRN, date of birth, ID band Patient awake    Reviewed: Allergy & Precautions, NPO status , Patient's Chart, lab work & pertinent test results  History of Anesthesia Complications Negative for: history of anesthetic complications  Airway Mallampati: III  TM Distance: >3 FB Neck ROM: Full    Dental  (+) Dental Advisory Given   Pulmonary shortness of breath, sleep apnea , neg recent URI,    breath sounds clear to auscultation       Cardiovascular hypertension, +CHF   Rhythm:Regular  1. Left ventricular ejection fraction, by estimation, is 30 to 35%. The  left ventricle has moderately decreased function. The left ventricle  demonstrates global hypokinesis. The left ventricular internal cavity size  was mildly dilated. Left ventricular  diastolic parameters are indeterminate.  2. Right ventricular systolic function was not well visualized. The right  ventricular size is not well visualized.  3. Left atrial size was moderately dilated.  4. The mitral valve is abnormal. Trivial mitral valve regurgitation.  5. The aortic valve is tricuspid. Aortic valve regurgitation is not  visualized.  6. Aortic dilatation noted. There is mild dilatation of the ascending  aorta measuring 39 mm.  7. The inferior vena cava is dilated in size with <50% respiratory  variability, suggesting right atrial pressure of 15 mmHg.    Neuro/Psych negative neurological ROS  negative psych ROS   GI/Hepatic   Endo/Other  diabetesMorbid obesity  Renal/GU Renal disease     Musculoskeletal   Abdominal   Peds  Hematology  (+) Blood dyscrasia, anemia ,   Anesthesia Other Findings   Reproductive/Obstetrics                            Anesthesia Physical Anesthesia Plan  ASA: IV  Anesthesia Plan: General   Post-op Pain Management:    Induction: Intravenous  PONV Risk  Score and Plan: 2 and Ondansetron and Dexamethasone  Airway Management Planned: Oral ETT  Additional Equipment:   Intra-op Plan:   Post-operative Plan: Extubation in OR  Informed Consent: I have reviewed the patients History and Physical, chart, labs and discussed the procedure including the risks, benefits and alternatives for the proposed anesthesia with the patient or authorized representative who has indicated his/her understanding and acceptance.     Dental advisory given  Plan Discussed with: CRNA and Surgeon  Anesthesia Plan Comments:        Anesthesia Quick Evaluation

## 2019-10-11 NOTE — Progress Notes (Signed)
ANTICOAGULATION CONSULT NOTE   Pharmacy Consult for heparin Indication: atrial fibrillation  No Known Allergies  Patient Measurements: Height: 6\' 2"  (188 cm) Weight: (!) 415 lb 5.6 oz (188.4 kg) IBW/kg (Calculated) : 82.2 Heparin Dosing Weight: 130kg  Vital Signs: Temp: 98.6 F (37 C) (03/18 0415) Temp Source: Oral (03/18 0415) BP: 127/70 (03/18 0415) Pulse Rate: 82 (03/18 0415)  Labs: Recent Labs    10/09/19 0312 10/09/19 0312 10/09/19 1618 10/10/19 0441 10/10/19 0442 10/10/19 1043 10/11/19 0216 10/11/19 0356  HGB 13.1   < >  --   --  11.7*  --   --  11.7*  HCT 40.3  --   --   --  36.3*  --   --  36.2*  PLT 267  --   --   --  243  --   --  250  LABPROT  --   --   --   --   --   --   --  16.2*  INR  --   --   --   --   --   --   --  1.3*  HEPARINUNFRC <0.10*  --    < > 0.63  --  0.55 0.11*  --   CREATININE 1.18  --   --   --  1.29*  --   --  1.28*   < > = values in this interval not displayed.    Estimated Creatinine Clearance: 115 mL/min (A) (by C-G formula based on SCr of 1.28 mg/dL (H)).  Assessment: 48 yoF with hx AFib admitted with RVR. Pt previously on Xarelto but has not been taking recently. Pt started on IV heparin with need for various procedures.   Hepain level subtherapeutic (0.11) on gtt at 3150 units/hr. Heparin line was disconnected for a bit overnight (probably about 2 hours before they drew the level) so likely inaccurate.  Goal of Therapy:  Heparin level 0.3-0.7 units/ml Monitor platelets by anticoagulation protocol: Yes   Plan:  - Contine heparin at 3150 units/hr - Pt scheduled for I&D again today (~0940) - heparin likely to be off again on-call - Will order heparin level for 0800   03-18-1972, PharmD, BCPS Please see amion for complete clinical pharmacist phone list 10/11/2019 4:57 AM

## 2019-10-11 NOTE — Progress Notes (Signed)
Progress Note  Patient Name: Maurice Nguyen Date of Encounter: 10/11/2019  Primary Cardiologist: Kristeen Miss, MD   Subjective   Feels well this morning.  He is ready to have his angiogram.  There was some confusion because he was called for a potential second debridement.  Inpatient Medications    Scheduled Meds: . [MAR Hold] diltiazem  60 mg Oral Q6H  . [MAR Hold] furosemide  40 mg Intravenous BID  . [MAR Hold] insulin aspart  0-20 Units Subcutaneous TID WC  . [MAR Hold] insulin NPH Human  40 Units Subcutaneous BID AC & HS  . [MAR Hold] losartan  25 mg Oral Daily  . [MAR Hold] multivitamin with minerals  1 tablet Oral Daily  . [MAR Hold] Ensure Max Protein  11 oz Oral BID  . [MAR Hold] sodium chloride flush  10-40 mL Intracatheter Q12H  . [MAR Hold] sodium chloride flush  3 mL Intravenous Q12H   Continuous Infusions: . [MAR Hold] sodium chloride    . heparin Stopped (10/11/19 0818)  . lactated ringers 10 mL/hr at 10/10/19 1138  . lactated ringers 10 mL/hr at 10/11/19 0958  . [MAR Hold] piperacillin-tazobactam (ZOSYN)  IV 3.375 g (10/11/19 7035)  . [MAR Hold] vancomycin 1,500 mg (10/11/19 0418)   PRN Meds: [MAR Hold] sodium chloride, [MAR Hold] acetaminophen, [MAR Hold] ondansetron (ZOFRAN) IV, [MAR Hold] oxyCODONE-acetaminophen, [MAR Hold] sodium chloride flush, [MAR Hold] sodium chloride flush   Vital Signs    Vitals:   10/11/19 0415 10/11/19 0657 10/11/19 0751 10/11/19 0952  BP: 127/70  113/86   Pulse: 82  81   Resp: 20  18   Temp: 98.6 F (37 C)  98.4 F (36.9 C)   TempSrc: Oral  Oral   SpO2: 95%  99%   Weight:  (!) 188 kg    Height:    6\' 2"  (1.88 m)    Intake/Output Summary (Last 24 hours) at 10/11/2019 1012 Last data filed at 10/11/2019 0900 Gross per 24 hour  Intake 2055.04 ml  Output 4170 ml  Net -2114.96 ml   Last 3 Weights 10/11/2019 10/10/2019 10/10/2019  Weight (lbs) 414 lb 7.4 oz 415 lb 5.6 oz 415 lb 5.6 oz  Weight (kg) 188 kg 188.4 kg  188.4 kg      Telemetry    Atrial fibrillation- Personally Reviewed  ECG      Physical Exam   GEN: No acute distress.   Neck: No JVD Cardiac:  Irregular rhythm, no murmurs, rubs, or gallops.  Respiratory: Clear to auscultation bilaterally. GI: Soft, nontender, non-distended, obese MS:  Left foot bandaged Neuro:  Nonfocal  Psych: Normal affect   Labs    High Sensitivity Troponin:  No results for input(s): TROPONINIHS in the last 720 hours.    Chemistry Recent Labs  Lab 10/08/19 1520 10/08/19 1520 10/09/19 0312 10/10/19 0442 10/11/19 0356  NA 135   < > 135 136 137  K 4.3   < > 4.6 3.5 3.9  CL 98   < > 97* 97* 101  CO2 26   < > 27 25 27   GLUCOSE 127*   < > 104* 162* 190*  BUN 21*   < > 18 24* 24*  CREATININE 1.45*   < > 1.18 1.29* 1.28*  CALCIUM 8.6*   < > 8.4* 8.1* 8.2*  PROT 7.2  --   --   --   --   ALBUMIN 2.8*  --   --   --  2.4*  AST 24  --   --   --   --   ALT 27  --   --   --   --   ALKPHOS 76  --   --   --   --   BILITOT 1.1  --   --   --   --   GFRNONAA 54*   < > >60 >60 >60  GFRAA >60   < > >60 >60 >60  ANIONGAP 11   < > 11 14 9    < > = values in this interval not displayed.     Hematology Recent Labs  Lab 10/09/19 0312 10/10/19 0442 10/11/19 0356  WBC 11.6* 10.7* 8.8  RBC 4.17* 3.76* 3.76*  HGB 13.1 11.7* 11.7*  HCT 40.3 36.3* 36.2*  MCV 96.6 96.5 96.3  MCH 31.4 31.1 31.1  MCHC 32.5 32.2 32.3  RDW 13.5 13.5 13.3  PLT 267 243 250    BNP Recent Labs  Lab 10/09/19 1755  BNP 320.8*     DDimer No results for input(s): DDIMER in the last 168 hours.   Radiology    ECHOCARDIOGRAM COMPLETE  Result Date: 10/09/2019    ECHOCARDIOGRAM REPORT   Patient Name:   [name] Date of Exam: 10/09/2019 Medical Rec #:  [id number]          Height:       74.0 in Accession #:    [id number]         Weight:       384.3 lb Date of Birth:  Apr 21, 1966           BSA:          2.869 m Patient Age:    67 years           BP:           112/72 mmHg  Patient Gender: M                  HR:           94 bpm. Exam Location:  Inpatient Procedure: 2D Echo, Cardiac Doppler and Color Doppler Indications:    Atrial Fibrillation 427.31  History:        Patient has prior history of Echocardiogram examinations, most                 recent 01/31/2015. Arrythmias:Atrial Fibrillation; Risk                 Factors:Hypertension, Diabetes, Dyslipidemia and Non-Smoker.  Sonographer:    Vickie Epley RDCS Referring Phys: [phone] JARED M GARDNER  Sonographer Comments: Patient is morbidly obese and suboptimal apical window. Image acquisition challenging due to uncooperative patient. Patient refused definity. IMPRESSIONS  1. Left ventricular ejection fraction, by estimation, is 30 to 35%. The left ventricle has moderately decreased function. The left ventricle demonstrates global hypokinesis. The left ventricular internal cavity size was mildly dilated. Left ventricular diastolic parameters are indeterminate.  2. Right ventricular systolic function was not well visualized. The right ventricular size is not well visualized.  3. Left atrial size was moderately dilated.  4. The mitral valve is abnormal. Trivial mitral valve regurgitation.  5. The aortic valve is tricuspid. Aortic valve regurgitation is not visualized.  6. Aortic dilatation noted. There is mild dilatation of the ascending aorta measuring 39 mm.  7. The inferior vena cava is dilated in size with <50% respiratory variability, suggesting right atrial pressure of 15 mmHg. FINDINGS  Left Ventricle: Left ventricular ejection fraction, by estimation, is 30 to 35%. The left ventricle has moderately decreased function. The left ventricle demonstrates global hypokinesis. The left ventricular internal cavity size was mildly dilated. There is no left ventricular hypertrophy. Left ventricular diastolic parameters are indeterminate. Right Ventricle: The right ventricular size is not well visualized. Right vetricular wall thickness was not  assessed. Right ventricular systolic function was not well visualized. Left Atrium: Left atrial size was moderately dilated. Right Atrium: Right atrial size was normal in size. Pericardium: There is no evidence of pericardial effusion. Mitral Valve: The mitral valve is abnormal. Mild mitral annular calcification. Trivial mitral valve regurgitation. Tricuspid Valve: The tricuspid valve is not well visualized. Tricuspid valve regurgitation is trivial. Aortic Valve: The aortic valve is tricuspid. Aortic valve regurgitation is not visualized. Pulmonic Valve: The pulmonic valve was normal in structure. Pulmonic valve regurgitation is not visualized. Aorta: Aortic dilatation noted. There is mild dilatation of the ascending aorta measuring 39 mm. Venous: The inferior vena cava is dilated in size with less than 50% respiratory variability, suggesting right atrial pressure of 15 mmHg. IAS/Shunts: The interatrial septum was not well visualized.  LEFT VENTRICLE PLAX 2D LVIDd:         5.79 cm LVIDs:         4.88 cm LV PW:         0.95 cm LV IVS:        0.95 cm LVOT diam:     2.60 cm LV SV:         70 LV SV Index:   24 LVOT Area:     5.31 cm  RIGHT VENTRICLE TAPSE (M-mode): 1.4 cm LEFT ATRIUM              Index       RIGHT ATRIUM           Index LA diam:        5.10 cm  1.78 cm/m  RA Area:     26.80 cm LA Vol (A2C):   94.6 ml  32.98 ml/m RA Volume:   92.80 ml  32.35 ml/m LA Vol (A4C):   109.0 ml 38.00 ml/m LA Biplane Vol: 106.0 ml 36.95 ml/m  AORTIC VALVE LVOT Vmax:   77.00 cm/s LVOT Vmean:  50.700 cm/s LVOT VTI:    0.131 m  AORTA Ao Root diam: 4.00 cm Ao Asc diam:  3.90 cm  SHUNTS Systemic VTI:  0.13 m Systemic Diam: 2.60 cm Zoila Shutter MD Electronically signed by Zoila Shutter MD Signature Date/Time: 10/09/2019/3:43:30 PM    Final     Cardiac Studies   Decreased ejection fraction  Patient Profile     54 y.o. male cardiomyopathy of unknown origin  Assessment & Plan    Cardiomyopathy: Continue rate control  for his A. fib.  Will need to enhance medical therapy for his cardiomyopathy as well.  Plan for coronary angiogram today.  I again went over the procedure with the patient.  He is willing to proceed.  I verified the consent is signed and on the paper chart.  Further cardiac testing depends on results of catheterization.     For questions or updates, please contact [facility] Please consult [email or web address] for contact info under        Signed, Lance Muss, MD  10/11/2019, 10:12 AM

## 2019-10-11 NOTE — Op Note (Signed)
    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 Randie Heinz, MD Procedure Performed: 1.  Ultrasound-guided cannulation right common femoral artery 2.  Aortogram 3.  Selection of left superficial femoral artery and left lower extremity angiography 4.  Mynx device closure right common femoral artery  Indications: 54 year old male with a left lower extremity wound that has been debrided.  He has concern for stenosis on recent duplex and is indicated for angiography with possible intervention.  Findings: Aorta and iliac system is free of flow-limiting stenosis.  Left common femoral artery and left SFA and popliteal system are free of flow-limiting stenosis.  Tibial trifurcation is patent.  Posterior tibial artery occludes in the mid calf is reconstituted via peroneal which is the main runoff to the foot.  Anterior tibial legs behind the peroneal artery dorsalis pedis appears to occlude.  No intervention was undertaken.   Procedure:  The patient was identified in the holding area and taken to the operating room the operating room where is placed supine operative table and general anesthesia was induced.  He was sterilely prepped draped in the bilateral groins in usual fashion antibiotics were minister timeout was called.  We began using ultrasound guidance to cannulate the right common femoral artery with micropuncture needle followed the wire and sheath.  And images saved the permanent record.  We placed a Bentson wire followed by five [place] sheath.  An Omni catheter was placed the level of L1 and aortogram performed.  We crossed the bifurcation using Bentson wire and Omni catheter perform left lower extremity angiography of the left common femoral artery.  At this time we cannot see his tibial arteries.  I remove the catheter and wire.  Dr.  Clifton James then performed cardiac cath.  We were able to adjust the zeego to see lower in the left lower extremity.  We then visualized the bifurcation again and crossed with Bentson wire and Omni catheter.  This time we placed the SFA we are able to get pictures down to the level of the foot.  We elected to not intervene.  Catheter was removed over wire.  We deployed a minx device in the right common femoral artery.   Contrast: 111cc  Yazmine Sorey C. Randie Heinz, MD [facility] Office: [phone] Pager: [phone]

## 2019-10-11 NOTE — CV Procedure (Signed)
     Cardiac Catheterization Operative Report  SERAJ DUNNAM [id number] 3/18/20212:18 PM  Procedure Performed:  1. Left Heart Catheterization 2. Selective Coronary Angiography  Operator: Verne Carrow, MD  Indication:   54 yo male with cardiomyopathy.                                    Procedure Details: The risks, benefits, complications, treatment options, and expected outcomes were discussed with the patient. The patient and/or family concurred with the proposed plan, giving informed consent. The patient was brought to the OR for his planned lower extremity angiogram per Dr. Randie Heinz. When I entered the case, the patient was intubated and sedated. A 5 French sheath was present in the right femoral artery. Standard diagnostic catheters were used to perform selective coronary angiography. LV pressures measured with the JL4 catheter.   There were no immediate complications. At the conclusion of the cath, the sheath was left in place and Dr. Randie Heinz proceeded with further lower extremity imaging.   Hemodynamic Findings: Central aortic pressure: 87/64 Left ventricular pressure: 89/19/30  Angiographic Findings:  Left main: No obstructive disease  Left Anterior Descending Artery: Large caliber vessel that courses to the apex. 30% mid stenosis. Diffuse mild to moderate distal stenosis.    Circumflex Artery: Large caliber vessel with mild disease in the proximal vessel.    Right Coronary Artery: Large, dominant vessel. The distal vessel branches into a posterolateral branch and a PDA. The posterolateral branch is a small caliber vessel with a moderately severe stenosis.    Impression: 1. No obstructive disease in the LAD or Circumflex 2. Moderately severe stenosis in the small posterolateral branch of the RCA. This vessel is small and not likely contributing to his cardiomyopathy 3. Elevated LVEDP  Recommendations: Medical management of CAD and cardiomyopathy.          Complications:  None. The patient tolerated the procedure well.

## 2019-10-11 NOTE — Anesthesia Procedure Notes (Signed)
Procedure Name: Intubation Date/Time: 10/11/2019 12:35 PM Performed by: Laruth Bouchard., CRNA Pre-anesthesia Checklist: Patient identified, Emergency Drugs available, Patient being monitored, Suction available and Timeout performed Patient Re-evaluated:Patient Re-evaluated prior to induction Oxygen Delivery Method: Circle system utilized Preoxygenation: Pre-oxygenation with 100% oxygen Induction Type: IV induction and Rapid sequence Laryngoscope Size: Glidescope and 4 Grade View: Grade I Tube type: Oral Tube size: 7.5 mm Number of attempts: 1 Airway Equipment and Method: Rigid stylet and Video-laryngoscopy Placement Confirmation: ETT inserted through vocal cords under direct vision,  positive ETCO2 and breath sounds checked- equal and bilateral Secured at: 22 cm Tube secured with: Tape Dental Injury: Teeth and Oropharynx as per pre-operative assessment

## 2019-10-11 NOTE — Progress Notes (Signed)
PROGRESS NOTE  Maurice Nguyen TKZ:[id number] DOB: Dec 12, 1965   PCP: Tracey Harries, MD  Patient is from: home.  Bedbound?  DOA: 10/08/2019 LOS: 2  Brief Narrative / Interim history: 54 yo AA male with history of uncontrolled DM-2 with left foot wound, HTN, systolic HF EF 30-35%, paroxysmal A fib and debility presenting with worsening left foot infection and worsening BLE edema.  He has been followed by Dr. Ardelle Anton of Podiatry, was scheduled also to consult with vascular surgery Dr. Darrick Penna on 3/18.  Patient stopped his diuretics for about 2 weeks as he was unable to get to the bathroom.   CT left foot did not reveal focal fluid collection or abscess, no CT findings of osteo.  He was also found to be in A. fib with RVR and started on Cardizem drip and IV Lasix.   He was admitted for diabetic left foot wound in the setting of possible PAD, combined CHF exacerbation and A. fib with RVR.  Podiatry, vascular surgery and cardiology following.  Patient underwent debridement of left diabetic foot ulcer by podiatry on 3/17.  Then he  underwent LLE angiogram and LHC on 3/18.  LLE angiogram revealed occlusion of posterior tibial artery in mid calf with reconstitution via peroneal which is the main runoff to the foot but no flow obstructing stenosis proximally.  LHC with moderately severe stenosis in a small posterolateral branch of RCA but no other significant CAD.    Subjective: Return from LLE angiogram and LHC.  Feels lightheaded.  Denies chest pain, dyspnea, palpitation, headache, vision change or focal neuro symptoms.  Denies GI symptoms.  Normal blood pressure.  No bleeding from right groin.  Objective: Vitals:   10/11/19 1500 10/11/19 1515 10/11/19 1527 10/11/19 1623  BP: 135/77 (!) 144/79 116/71 117/63  Pulse: 78 77 72 68  Resp: (!) 30 (!) 24 (!) 24 17  Temp:   98.4 F (36.9 C) 98.7 F (37.1 C)  TempSrc:    Oral  SpO2: 96% 100% 95% 96%  Weight:      Height:        Intake/Output  Summary (Last 24 hours) at 10/11/2019 1659 Last data filed at 10/11/2019 1609 Gross per 24 hour  Intake 2765.06 ml  Output 1870 ml  Net 895.06 ml   Filed Weights   10/10/19 0500 10/10/19 1118 10/11/19 0657  Weight: (!) 188.4 kg (!) 188.4 kg (!) 188 kg    Examination:  GENERAL: No acute distress.  Appears well.  HEENT: MMM.  Vision and hearing grossly intact.  NECK: Supple.  Difficult to assess JVD due to body habitus. RESP:  No IWOB.  Fair aeration but difficult exam due to body habitus. CVS:  RRR. Heart sounds normal.  ABD/GI/GU: Bowel sounds present. Soft. Non tender.  MSK/EXT: BLE chronic venous insufficiency with lymphedema SKIN: BLE stasis dermatitis.  Dressing over left foot.  Dressing over the right groin. NEURO: Awake, alert and oriented appropriately.  No apparent focal neuro deficit. PSYCH: Calm.  Flat affect.  Procedures:  3/17-left foot wound debridement by Dr. Ardelle Anton  3/18-LLE angiogram revealed occlusion of posterior tibial artery in mid calf with reconstitution via peroneal which is the main runoff to the foot but no flow obstructing stenosis proximally.    3/18-LHC with moderately severe stenosis in a small posterolateral branch of RCA but no other significant CAD.   Assessment & Plan: Diabetic foot wound/abscess with peripheral artery disease-ABI completed as an outpatient on 3/3 which revealed diminished flow to the  left foot. CRP 12, sed rate 53.  Blood cultures negative so far. -Debridement by podiatry, Dr. Ardelle Anton on 10/10/2019 -LLE angiogram as above. -Augmentin --> Vanco/zosyn  -Follow wound cultures and adjust antibiotic as appropriate  A. fib RVR: Likely due to fluid overload.  Rate controlled on p.o. Cardizem -On p.o. Cardizem but prefer beta-blocker in the setting of CHF-defer to cardiology -On IV heparin for anticoagulation  Acute on chronic combined systolic and diastolic heart failure: Echo with EF of 30 to 35% (unchanged from 2016),  indeterminate LV diastolic and RV function, moderate LAE and ascending aortic dilation to 39 mm.  LHC as above.  Responding to IV Lasix.  Renal function is stable. -On IV Lasix 40 mg twice daily per cardiology -GDMT-losartan -Benefits from ICD when infection is under good control? -Monitor fluid status, renal function and electrolytes -Sodium and fluid restriction  Uncontrolled DM-2 with hyperglycemia and diabetic foot ulcer: A1c 11.2%. Recent Labs    10/11/19 1218 10/11/19 1455 10/11/19 1627  GLUCAP 96 123* 171*  -Continue current insulin regimen -Continue statin -Could benefit from newer agents with cardiovascular benefits  AKI/azotemia: Baseline Cr 0.9-1.1> 1.45 (admit)> 1.18> 1.29 -Continue monitoring while on diuretics and ARB  OSA on CPAP -Continue nightly CPAP  Morbid obesity: Body mass index is 53.21 kg/m. -May benefit from dietary intervention and GLP-1 inhibitors. -Reportedly bedbound  Debility/physical deconditioning-reportedly bedbound at baseline. -PT/OT -May need a wheelchair.        Nutrition Problem: Increased nutrient needs Etiology: wound healing  Signs/Symptoms: estimated needs  Interventions: MVI, Premier Protein, Refer to RD note for recommendations   DVT prophylaxis: On heparin for A. fib Code Status: Full code Family Communication: Updated patient's wife at bedside.  Discharge barrier: On IV antibiotics for LE diabetic wound, acute CHF on IV Lasix, atrial fibrillation Patient is from: Home Final disposition: To be determined  Consultants: Podiatry, cardiology, vascular surgery   Microbiology summarized: COVID-19 negative MRSA PCR negative Blood cultures negative Tissue culture pending  Sch Meds:  Scheduled Meds: . diltiazem  60 mg Oral Q6H  . furosemide  40 mg Intravenous BID  . insulin aspart  0-20 Units Subcutaneous TID WC  . insulin NPH Human  40 Units Subcutaneous BID AC & HS  . losartan  25 mg Oral Daily  .  multivitamin with minerals  1 tablet Oral Daily  . Ensure Max Protein  11 oz Oral BID  . sodium chloride flush  10-40 mL Intracatheter Q12H  . sodium chloride flush  3 mL Intravenous Q12H   Continuous Infusions: . sodium chloride    . heparin Stopped (10/11/19 0818)  . lactated ringers 10 mL/hr at 10/10/19 1138  . piperacillin-tazobactam (ZOSYN)  IV 3.375 g (10/11/19 6568)  . vancomycin 1,500 mg (10/11/19 0418)   PRN Meds:.sodium chloride, acetaminophen, ondansetron (ZOFRAN) IV, oxyCODONE-acetaminophen, sodium chloride flush, sodium chloride flush  Antimicrobials: Anti-infectives (From admission, onward)   Start     Dose/Rate Route Frequency Ordered Stop   10/10/19 0430  vancomycin (VANCOREADY) IVPB 1500 mg/300 mL     1,500 mg 150 mL/hr over 120 Minutes Intravenous Every 12 hours 10/09/19 1003     10/09/19 1200  piperacillin-tazobactam (ZOSYN) IVPB 3.375 g     3.375 g 12.5 mL/hr over 240 Minutes Intravenous Every 8 hours 10/09/19 0946     10/09/19 1015  vancomycin (VANCOCIN) 2,500 mg in sodium chloride 0.9 % 500 mL IVPB     2,500 mg 250 mL/hr over 120 Minutes Intravenous  Once 10/09/19  1003 10/09/19 1834   10/09/19 1000  amoxicillin-clavulanate (AUGMENTIN) 875-125 MG per tablet 1 tablet  Status:  Discontinued     1 tablet Oral Every 12 hours 10/08/19 2155 10/09/19 0946   10/08/19 2145  amoxicillin-clavulanate (AUGMENTIN) 875-125 MG per tablet 1 tablet     1 tablet Oral  Once 10/08/19 2130 10/08/19 2247       I have personally reviewed the following labs and images: CBC: Recent Labs  Lab 10/08/19 1520 10/09/19 0312 10/10/19 0442 10/11/19 0356  WBC 9.3 11.6* 10.7* 8.8  NEUTROABS 6.4  --   --   --   HGB 12.9* 13.1 11.7* 11.7*  HCT 40.9 40.3 36.3* 36.2*  MCV 97.8 96.6 96.5 96.3  PLT 203 267 243 250   BMP &GFR Recent Labs  Lab 10/08/19 1520 10/09/19 0312 10/10/19 0442 10/10/19 1645 10/11/19 0356  NA 135 135 136  --  137  K 4.3 4.6 3.5  --  3.9  CL 98 97* 97*  --   101  CO2 26 27 25   --  27  GLUCOSE 127* 104* 162*  --  190*  BUN 21* 18 24*  --  24*  CREATININE 1.45* 1.18 1.29*  --  1.28*  CALCIUM 8.6* 8.4* 8.1*  --  8.2*  MG  --   --   --  1.8 2.0  PHOS  --   --   --   --  3.8   Estimated Creatinine Clearance: 114.8 mL/min (A) (by C-G formula based on SCr of 1.28 mg/dL (H)). Liver & Pancreas: Recent Labs  Lab 10/08/19 1520 10/11/19 0356  AST 24  --   ALT 27  --   ALKPHOS 76  --   BILITOT 1.1  --   PROT 7.2  --   ALBUMIN 2.8* 2.4*   No results for input(s): LIPASE, AMYLASE in the last 168 hours. No results for input(s): AMMONIA in the last 168 hours. Diabetic: Recent Labs    10/08/19 2224  HGBA1C 11.2*   Recent Labs  Lab 10/11/19 0629 10/11/19 0958 10/11/19 1218 10/11/19 1455 10/11/19 1627  GLUCAP 151* 114* 96 123* 171*   Cardiac Enzymes: No results for input(s): CKTOTAL, CKMB, CKMBINDEX, TROPONINI in the last 168 hours. No results for input(s): PROBNP in the last 8760 hours. Coagulation Profile: Recent Labs  Lab 10/11/19 0356  INR 1.3*   Thyroid Function Tests: No results for input(s): TSH, T4TOTAL, FREET4, T3FREE, THYROIDAB in the last 72 hours. Lipid Profile: Recent Labs    10/10/19 0442  CHOL 109  HDL 34*  LDLCALC 65  TRIG 49  CHOLHDL 3.2   Anemia Panel: No results for input(s): VITAMINB12, FOLATE, FERRITIN, TIBC, IRON, RETICCTPCT in the last 72 hours. Urine analysis:    Component Value Date/Time   COLORURINE AMBER (A) 10/01/2015 1652   APPEARANCEUR CLOUDY (A) 10/01/2015 1652   LABSPEC 1.027 10/01/2015 1652   PHURINE 5.5 10/01/2015 1652   GLUCOSEU >1000 (A) 10/01/2015 1652   HGBUR NEGATIVE 10/01/2015 1652   BILIRUBINUR NEGATIVE 10/01/2015 1652   KETONESUR NEGATIVE 10/01/2015 1652   PROTEINUR 30 (A) 10/01/2015 1652   UROBILINOGEN 1.0 07/07/2012 1544   NITRITE NEGATIVE 10/01/2015 1652   LEUKOCYTESUR NEGATIVE 10/01/2015 1652   Sepsis Labs: Invalid input(s): PROCALCITONIN, LACTICIDVEN   Microbiology: Recent Results (from the past 240 hour(s))  SARS CORONAVIRUS 2 (TAT 6-24 HRS) Nasopharyngeal Nasopharyngeal Swab     Status: None   Collection Time: 10/08/19  6:08 PM   Specimen: Nasopharyngeal Swab  Result Value Ref Range Status   SARS Coronavirus 2 NEGATIVE NEGATIVE Final    Comment: (NOTE) SARS-CoV-2 target nucleic acids are NOT DETECTED. The SARS-CoV-2 RNA is generally detectable in upper and lower respiratory specimens during the acute phase of infection. Negative results do not preclude SARS-CoV-2 infection, do not rule out co-infections with other pathogens, and should not be used as the sole basis for treatment or other patient management decisions. Negative results must be combined with clinical observations, patient history, and epidemiological information. The expected result is Negative. Fact Sheet for Patients: HairSlick.no Fact Sheet for Healthcare Providers: quierodirigir.com This test is not yet approved or cleared by the [place] FDA and  has been authorized for detection and/or diagnosis of SARS-CoV-2 by FDA under an Emergency Use Authorization (EUA). This EUA will remain  in effect (meaning this test can be used) for the duration of the COVID-19 declaration under Section 56 4(b)(1) of the Act, 21 U.S.C. section 360bbb-3(b)(1), unless the authorization is terminated or revoked sooner. Performed at [facility], [address], [place], [place] [address]   Blood Cultures x 2 sites     Status: None (Preliminary result)   Collection Time: 10/08/19 10:24 PM   Specimen: BLOOD  Result Value Ref Range Status   Specimen Description BLOOD RIGHT UPPER ARM  Final   Special Requests   Final    BOTTLES DRAWN AEROBIC AND ANAEROBIC Blood Culture results may not be optimal due to an inadequate volume of blood received in culture bottles   Culture   Final    NO GROWTH 3 DAYS Performed at [facility], [address], [place], [place] [address]    Report Status PENDING  Incomplete  Blood Cultures x 2 sites     Status: None (Preliminary result)   Collection Time: 10/08/19 10:45 PM   Specimen: BLOOD  Result Value Ref Range Status   Specimen Description BLOOD LEFT ARM  Final   Special Requests   Final    BOTTLES DRAWN AEROBIC AND ANAEROBIC Blood Culture results may not be optimal due to an inadequate volume of blood received in culture bottles   Culture   Final    NO GROWTH 3 DAYS Performed at [facility], [address] [place], [place] [address]    Report Status PENDING  Incomplete  MRSA PCR Screening     Status: None   Collection Time: 10/09/19  3:12 AM   Specimen: Nasopharyngeal  Result Value Ref Range Status   MRSA by PCR NEGATIVE NEGATIVE Final    Comment:        The GeneXpert MRSA Assay (FDA approved for NASAL specimens only), is one component of a comprehensive MRSA colonization surveillance program. It is not intended to diagnose MRSA infection nor to guide or monitor treatment for MRSA infections. Performed at [facility], [address] [place], [place] [address]   Aerobic/Anaerobic Culture (surgical/deep wound)     Status: None (Preliminary result)   Collection Time: 10/10/19 12:15 PM   Specimen: Wound  Result Value Ref Range Status   Specimen Description WOUND  Final   Special Requests LEFT FOOT  Final   Gram Stain   Final    FEW WBC PRESENT, PREDOMINANTLY PMN MODERATE GRAM POSITIVE COCCI    Culture   Final    FEW PROTEUS MIRABILIS SUSCEPTIBILITIES TO FOLLOW CULTURE REINCUBATED FOR BETTER GROWTH Performed at [facility], [address] [place], [place] [address]  Report Status PENDING  Incomplete    Radiology Studies: HYBRID OR IMAGING ([facility] ONLY)  Result Date: 10/11/2019 There is no interpretation for this exam.  This order is for images obtained during a surgical procedure.  Please See "Surgeries" Tab for more  information regarding the procedure.     Aijalon Demuro T. [place]  If 7PM-7AM, please contact night-coverage [email or web address] Password [facility] 10/11/2019, 4:59 PM

## 2019-10-11 NOTE — Progress Notes (Signed)
Patient has arrived back on the unit from post angiogram and post cardiac cath. Assessed right groin and shows no sign of bleeding. Patient complains of dizziness, Gonfa MD has been paged.

## 2019-10-11 NOTE — Progress Notes (Signed)
Patient would like procedural notes from surgery about what has transpired during his procedures that happened on 3/17 & 3/18. I have informed the patient that I do not have access to timely notes from surgery, just summaries of the procedures. I also told the patient if he may ask the surgeons/physicians when they round. I also relayed to oncoming night nurse to relay to oncoming day nurse for procedural notes.

## 2019-10-11 NOTE — Progress Notes (Signed)
    Subjective  -  No complaints this am   Physical Exam:  abd soft Non-labored breathing       Assessment/Plan:    Discussed plans for angio today. Intervention will be based on findings.  He is willing to proceedure  Wells Maurice Nguyen 10/11/2019 10:54 AM --  Vitals:   10/11/19 0415 10/11/19 0751  BP: 127/70 113/86  Pulse: 82 81  Resp: 20 18  Temp: 98.6 F (37 C) 98.4 F (36.9 C)  SpO2: 95% 99%    Intake/Output Summary (Last 24 hours) at 10/11/2019 1054 Last data filed at 10/11/2019 0900 Gross per 24 hour  Intake 1917.18 ml  Output 4170 ml  Net -2252.82 ml     Laboratory CBC    Component Value Date/Time   WBC 8.8 10/11/2019 0356   HGB 11.7 (L) 10/11/2019 0356   HCT 36.2 (L) 10/11/2019 0356   PLT 250 10/11/2019 0356    BMET    Component Value Date/Time   NA 137 10/11/2019 0356   K 3.9 10/11/2019 0356   CL 101 10/11/2019 0356   CO2 27 10/11/2019 0356   GLUCOSE 190 (H) 10/11/2019 0356   BUN 24 (H) 10/11/2019 0356   CREATININE 1.28 (H) 10/11/2019 0356   CALCIUM 8.2 (L) 10/11/2019 0356   GFRNONAA >60 10/11/2019 0356   GFRAA >60 10/11/2019 0356    COAG Lab Results  Component Value Date   INR 1.3 (H) 10/11/2019   INR 1.45 10/02/2015   INR 1.04 09/27/2013   No results found for: PTT  Antibiotics Anti-infectives (From admission, onward)   Start     Dose/Rate Route Frequency Ordered Stop   10/10/19 0430  [MAR Hold]  vancomycin (VANCOREADY) IVPB 1500 mg/300 mL     (MAR Hold since Thu 10/11/2019 at 0935.Hold Reason: Transfer to a Procedural area.)   1,500 mg 150 mL/hr over 120 Minutes Intravenous Every 12 hours 10/09/19 1003     10/09/19 1200  [MAR Hold]  piperacillin-tazobactam (ZOSYN) IVPB 3.375 g     (MAR Hold since Thu 10/11/2019 at 0935.Hold Reason: Transfer to a Procedural area.)   3.375 g 12.5 mL/hr over 240 Minutes Intravenous Every 8 hours 10/09/19 0946     10/09/19 1015  vancomycin (VANCOCIN) 2,500 mg in sodium chloride 0.9 % 500 mL IVPB      2,500 mg 250 mL/hr over 120 Minutes Intravenous  Once 10/09/19 1003 10/09/19 1834   10/09/19 1000  amoxicillin-clavulanate (AUGMENTIN) 875-125 MG per tablet 1 tablet  Status:  Discontinued     1 tablet Oral Every 12 hours 10/08/19 2155 10/09/19 0946   10/08/19 2145  amoxicillin-clavulanate (AUGMENTIN) 875-125 MG per tablet 1 tablet     1 tablet Oral  Once 10/08/19 2130 10/08/19 2247       V. Charlena Cross, M.D., [facility] [place] Office: [phone] Pager:  [phone]

## 2019-10-11 NOTE — Transfer of Care (Signed)
Immediate Anesthesia Transfer of Care Note  Patient: Maurice Nguyen  Procedure(s) Performed: LOWER EXTREMITY ANGIOGRAM (Bilateral ) left heart cath and coronary angiography (Left )  Patient Location: PACU  Anesthesia Type:General  Level of Consciousness: awake, alert  and oriented  Airway & Oxygen Therapy: Patient Spontanous Breathing and Patient connected to face mask oxygen  Post-op Assessment: Report given to RN and Post -op Vital signs reviewed and stable  Post vital signs: Reviewed and stable  Last Vitals:  Vitals Value Taken Time  BP    Temp    Pulse 78 10/11/19 1455  Resp 31 10/11/19 1455  SpO2 94 % 10/11/19 1455  Vitals shown include unvalidated device data.  Last Pain:  Vitals:   10/11/19 0952  TempSrc:   PainSc: 0-No pain      Patients Stated Pain Goal: 4 (10/11/19 1829)  Complications: No apparent anesthesia complications

## 2019-10-12 ENCOUNTER — Encounter: Payer: Self-pay | Admitting: *Deleted

## 2019-10-12 DIAGNOSIS — H5712 Ocular pain, left eye: Secondary | ICD-10-CM

## 2019-10-12 DIAGNOSIS — H538 Other visual disturbances: Secondary | ICD-10-CM

## 2019-10-12 LAB — RENAL FUNCTION PANEL
Albumin: 2.7 g/dL — ABNORMAL LOW (ref 3.5–5.0)
Anion gap: 11 (ref 5–15)
BUN: 22 mg/dL — ABNORMAL HIGH (ref 6–20)
CO2: 25 mmol/L (ref 22–32)
Calcium: 8.3 mg/dL — ABNORMAL LOW (ref 8.9–10.3)
Chloride: 99 mmol/L (ref 98–111)
Creatinine, Ser: 1.26 mg/dL — ABNORMAL HIGH (ref 0.61–1.24)
GFR calc Af Amer: >60 mL/min (ref >60)
GFR calc non Af Amer: >60 mL/min (ref >60)
Glucose, Bld: 292 mg/dL — ABNORMAL HIGH (ref 70–99)
Phosphorus: 3.1 mg/dL (ref 2.5–4.6)
Potassium: 4.7 mmol/L (ref 3.5–5.1)
Sodium: 135 mmol/L (ref 135–145)

## 2019-10-12 LAB — CBC
HCT: 39.6 % (ref 39.0–52.0)
Hemoglobin: 12.9 g/dL — ABNORMAL LOW (ref 13.0–17.0)
MCH: 31 pg (ref 26.0–34.0)
MCHC: 32.6 g/dL (ref 30.0–36.0)
MCV: 95.2 fL (ref 80.0–100.0)
Platelets: 266 10*3/uL (ref 150–400)
RBC: 4.16 MIL/uL — ABNORMAL LOW (ref 4.22–5.81)
RDW: 13.3 % (ref 11.5–15.5)
WBC: 10.9 10*3/uL — ABNORMAL HIGH (ref 4.0–10.5)
nRBC: 0 % (ref 0.0–0.2)

## 2019-10-12 LAB — SEDIMENTATION RATE: Sed Rate: 71 mm/hr — ABNORMAL HIGH (ref 0–16)

## 2019-10-12 LAB — GLUCOSE, CAPILLARY
Glucose-Capillary: 300 mg/dL — ABNORMAL HIGH (ref 70–99)
Glucose-Capillary: 255 mg/dL — ABNORMAL HIGH (ref 70–99)
Glucose-Capillary: 211 mg/dL — ABNORMAL HIGH (ref 70–99)
Glucose-Capillary: 198 mg/dL — ABNORMAL HIGH (ref 70–99)

## 2019-10-12 LAB — MAGNESIUM: Magnesium: 2 mg/dL (ref 1.7–2.4)

## 2019-10-12 LAB — HEPARIN LEVEL (UNFRACTIONATED)
Heparin Unfractionated: 0.49 IU/mL (ref 0.30–0.70)
Heparin Unfractionated: 0.56 IU/mL (ref 0.30–0.70)

## 2019-10-12 LAB — C-REACTIVE PROTEIN: CRP: 8.8 mg/dL — ABNORMAL HIGH (ref <1.0)

## 2019-10-12 LAB — VANCOMYCIN, TROUGH: Vancomycin Tr: 14 ug/mL — ABNORMAL LOW (ref 15–20)

## 2019-10-12 MED ORDER — INSULIN ASPART 100 UNIT/ML ~~LOC~~ SOLN
0.0000 [IU] | Freq: Three times a day (TID) | SUBCUTANEOUS | Status: DC
  Administered 2019-10-12: 7 [IU] via SUBCUTANEOUS
  Administered 2019-10-13: 4 [IU] via SUBCUTANEOUS
  Administered 2019-10-13 (×2): 7 [IU] via SUBCUTANEOUS
  Administered 2019-10-14: 3 [IU] via SUBCUTANEOUS
  Administered 2019-10-14 (×2): 4 [IU] via SUBCUTANEOUS
  Administered 2019-10-15: 3 [IU] via SUBCUTANEOUS
  Administered 2019-10-15: 4 [IU] via SUBCUTANEOUS
  Administered 2019-10-16: 2 [IU] via SUBCUTANEOUS

## 2019-10-12 MED ORDER — SODIUM CHLORIDE 0.9 % IV SOLN
3.0000 g | Freq: Four times a day (QID) | INTRAVENOUS | Status: DC
  Administered 2019-10-12 – 2019-10-17 (×18): 3 g via INTRAVENOUS
  Filled 2019-10-12: qty 3
  Filled 2019-10-12: qty 8
  Filled 2019-10-12 (×6): qty 3
  Filled 2019-10-12 (×2): qty 8
  Filled 2019-10-12: qty 3
  Filled 2019-10-12: qty 8
  Filled 2019-10-12: qty 3
  Filled 2019-10-12: qty 8
  Filled 2019-10-12: qty 3
  Filled 2019-10-12: qty 8
  Filled 2019-10-12: qty 3
  Filled 2019-10-12: qty 8
  Filled 2019-10-12: qty 3
  Filled 2019-10-12: qty 8
  Filled 2019-10-12 (×2): qty 3

## 2019-10-12 MED ORDER — INSULIN ASPART 100 UNIT/ML ~~LOC~~ SOLN
0.0000 [IU] | Freq: Every day | SUBCUTANEOUS | Status: DC
  Administered 2019-10-15: 4 [IU] via SUBCUTANEOUS
  Administered 2019-10-18: 0 [IU] via SUBCUTANEOUS

## 2019-10-12 MED ORDER — CARVEDILOL 3.125 MG PO TABS
3.1250 mg | ORAL_TABLET | Freq: Two times a day (BID) | ORAL | Status: DC
  Administered 2019-10-12 – 2019-10-13 (×2): 3.125 mg via ORAL
  Filled 2019-10-12 (×3): qty 1

## 2019-10-12 MED ORDER — SACCHAROMYCES BOULARDII 250 MG PO CAPS
250.0000 mg | ORAL_CAPSULE | Freq: Two times a day (BID) | ORAL | Status: DC
  Administered 2019-10-12 – 2019-10-19 (×13): 250 mg via ORAL
  Filled 2019-10-12 (×14): qty 1

## 2019-10-12 MED ORDER — ASPIRIN EC 81 MG PO TBEC
81.0000 mg | DELAYED_RELEASE_TABLET | Freq: Every day | ORAL | Status: DC
  Administered 2019-10-12 – 2019-10-17 (×6): 81 mg via ORAL
  Filled 2019-10-12 (×6): qty 1

## 2019-10-12 MED ORDER — INSULIN ASPART 100 UNIT/ML ~~LOC~~ SOLN
4.0000 [IU] | Freq: Three times a day (TID) | SUBCUTANEOUS | Status: DC
  Administered 2019-10-12 – 2019-10-15 (×8): 4 [IU] via SUBCUTANEOUS

## 2019-10-12 MED ORDER — ATORVASTATIN CALCIUM 80 MG PO TABS
80.0000 mg | ORAL_TABLET | Freq: Every day | ORAL | Status: DC
  Administered 2019-10-12 – 2019-10-18 (×7): 80 mg via ORAL
  Filled 2019-10-12 (×7): qty 1

## 2019-10-12 NOTE — Progress Notes (Signed)
Inpatient Diabetes Program Recommendations  AACE/ADA: New Consensus Statement on Inpatient Glycemic Control (2015)  Target Ranges:  Prepandial:   less than 140 mg/dL      Peak postprandial:   less than 180 mg/dL (1-2 hours)      Critically ill patients:  140 - 180 mg/dL   Lab Results  Component Value Date   GLUCAP 300 (H) 10/12/2019   HGBA1C 11.2 (H) 10/08/2019    Review of Glycemic Control Results for ADVIT, TRETHEWEY (MRN [id number]) as of 10/12/2019 09:59  Ref. Range 10/11/2019 06:29 10/11/2019 09:58 10/11/2019 12:18 10/11/2019 14:55 10/11/2019 16:27 10/11/2019 21:07 10/12/2019 06:18  Glucose-Capillary Latest Ref Range: 70 - 99 mg/dL 150 (H) 413 (H) 96 643 (H) 171 (H) 288 (H) 300 (H)   Diabetes history: DM 2 Outpatient Diabetes medications: 70/30 60 units bid, Metformin 1000 mg bid Current orders for Inpatient glycemic control:  NPH 40 units bid Novolog 0-20 units tid  Inpatient Diabetes Program Recommendations:    Was NPO yest am for procedure. Fasting glucose 150's yesterday.  Glucose trends up to 300 this am. Pt on 70/30 at home.  Consider adding Novolog 5 units tid meal coverage if eating at least 50% of meals.  Consider increasing NPH to 48 units qpm. Will follow glucose trends after this am's dose.  Thanks,  Christena Deem RN, MSN, BC-ADM Inpatient Diabetes Coordinator Team Pager [phone] (8a-5p)

## 2019-10-12 NOTE — Progress Notes (Signed)
Subjective: POD #1 s/p left foot wound debridement, incision and drainage.  He is having no pain in the lower extremity.  Underwent heart cath and lower extremity angiogram today.  No intervention was performed on either today.  He has questions about healing and chances of healing.  Otherwise he is doing well currently denies any fevers, chills, nausea, vomiting.  Denies any calf pain, chest pain, shortness of breath currently.   Objective: AAO x3, NAD Status post debridement of ulcerations x2 on the plantar aspect the left foot.  Granular, fibrotic with a small amount of necrotic tissue present.  There is no purulence.  There is no warmth of the foot.  There is no fluctuation crepitation.  Today there is no significant malodor. No pain with calf compression, warmth, erythema     Assessment: POD #1 s/p left lower extremity wound debridement/ I&D  Plan: White blood cell count 8.8.  Is been afebrile.  No pain.  No obvious signs of infection to the lower extremity today.  Discussed with possible return for further debridement while inpatient versus outpatient.  For now continue local wound care.  Saline wet-to-dry was applied today followed by dry sterile dressing.  Recommended nonweightbearing.  Elevation.  Again discussed that he is at high risk of limb loss.  Hopefully with local wound care/antibiotics we can get this to heal. Will continue to follow  Ovid Curd, DPM C: [phone] O: [phone]

## 2019-10-12 NOTE — Progress Notes (Signed)
Pt stated that his left eye was throbbing and watering. Tylenol given and offered a hot pack or ice but pt refused this intervention.

## 2019-10-12 NOTE — Progress Notes (Addendum)
Progress Note  Patient Name: Maurice Nguyen Date of Encounter: 10/12/2019  Primary Cardiologist: Dr. Acie Fredrickson, MD   Subjective   Reports no chest pain or SOB. Edema improved.   Inpatient Medications    Scheduled Meds: . diltiazem  60 mg Oral Q6H  . furosemide  40 mg Intravenous BID  . insulin aspart  0-20 Units Subcutaneous TID WC  . insulin NPH Human  40 Units Subcutaneous BID AC & HS  . losartan  25 mg Oral Daily  . multivitamin with minerals  1 tablet Oral Daily  . Ensure Max Protein  11 oz Oral BID  . sodium chloride flush  10-40 mL Intracatheter Q12H  . sodium chloride flush  3 mL Intravenous Q12H   Continuous Infusions: . sodium chloride    . heparin 3,150 Units/hr (10/12/19 0615)  . lactated ringers 10 mL/hr at 10/10/19 1138  . piperacillin-tazobactam (ZOSYN)  IV 3.375 g (10/12/19 0624)  . vancomycin 1,500 mg (10/12/19 0417)   PRN Meds: sodium chloride, acetaminophen, menthol-cetylpyridinium, ondansetron (ZOFRAN) IV, oxyCODONE-acetaminophen, sodium chloride flush, sodium chloride flush   Vital Signs    Vitals:   10/11/19 1931 10/11/19 2318 10/12/19 0419 10/12/19 0749  BP: 123/82 128/80 (!) 157/80   Pulse: (!) 103 89 87   Resp: 20 20 20    Temp: 98.5 F (36.9 C) 98.4 F (36.9 C) 98.3 F (36.8 C) 99.3 F (37.4 C)  TempSrc: Oral Oral  Oral  SpO2:  93% 96%   Weight:   (!) 175.3 kg   Height:        Intake/Output Summary (Last 24 hours) at 10/12/2019 0844 Last data filed at 10/12/2019 0750 Gross per 24 hour  Intake 2413.71 ml  Output 2270 ml  Net 143.71 ml   Filed Weights   10/10/19 1118 10/11/19 0657 10/12/19 0419  Weight: (!) 188.4 kg (!) 188 kg (!) 175.3 kg    Physical Exam   General: Obese, NAD Neck: Negative for carotid bruits. No JVD Lungs: Diminished in bilateral upper and lower lobes likely secondary to body habitus. Breathing is unlabored. Cardiovascular: Irregularly irregular with S1 S2. Abdomen: Soft, non-tender, distended. No  obvious abdominal masses. Extremities: 2-3+ BLE edema. Radial pulses 2+ bilaterally Neuro: Alert and oriented. No focal deficits. No facial asymmetry. MAE spontaneously. Psych: Responds to questions appropriately with normal affect.    Labs    Chemistry Recent Labs  Lab 10/08/19 1520 10/09/19 0312 10/10/19 0442 10/11/19 0356 10/12/19 0152  NA 135   < > 136 137 135  K 4.3   < > 3.5 3.9 4.7  CL 98   < > 97* 101 99  CO2 26   < > 25 27 25   GLUCOSE 127*   < > 162* 190* 292*  BUN 21*   < > 24* 24* 22*  CREATININE 1.45*   < > 1.29* 1.28* 1.26*  CALCIUM 8.6*   < > 8.1* 8.2* 8.3*  PROT 7.2  --   --   --   --   ALBUMIN 2.8*  --   --  2.4* 2.7*  AST 24  --   --   --   --   ALT 27  --   --   --   --   ALKPHOS 76  --   --   --   --   BILITOT 1.1  --   --   --   --   GFRNONAA 54*   < > >60 >60 >  60  GFRAA >60   < > >60 >60 >60  ANIONGAP 11   < > 14 9 11    < > = values in this interval not displayed.     Hematology Recent Labs  Lab 10/10/19 0442 10/11/19 0356 10/12/19 0152  WBC 10.7* 8.8 10.9*  RBC 3.76* 3.76* 4.16*  HGB 11.7* 11.7* 12.9*  HCT 36.3* 36.2* 39.6  MCV 96.5 96.3 95.2  MCH 31.1 31.1 31.0  MCHC 32.2 32.3 32.6  RDW 13.5 13.3 13.3  PLT 243 250 266    Cardiac EnzymesNo results for input(s): TROPONINI in the last 168 hours. No results for input(s): TROPIPOC in the last 168 hours.   BNP Recent Labs  Lab 10/09/19 1755  BNP 320.8*     DDimer No results for input(s): DDIMER in the last 168 hours.   Radiology    HYBRID OR IMAGING ([facility] ONLY)  Result Date: 10/11/2019 There is no interpretation for this exam.  This order is for images obtained during a surgical procedure.  Please See "Surgeries" Tab for more information regarding the procedure.   Telemetry    10/12/19 AF with rates in the 80's - Personally Reviewed  ECG    No new tracing as of 10/12/19- Personally Reviewed  Cardiac Studies   Echo 10/09/19 1. Left ventricular ejection fraction, by estimation,  is 30 to 35%. The  left ventricle has moderately decreased function. The left ventricle  demonstrates global hypokinesis. The left ventricular internal cavity size  was mildly dilated. Left ventricular  diastolic parameters are indeterminate.  2. Right ventricular systolic function was not well visualized. The right  ventricular size is not well visualized.  3. Left atrial size was moderately dilated.  4. The mitral valve is abnormal. Trivial mitral valve regurgitation.  5. The aortic valve is tricuspid. Aortic valve regurgitation is not  visualized.  6. Aortic dilatation noted. There is mild dilatation of the ascending  aorta measuring 39 mm.  7. The inferior vena cava is dilated in size with <50% respiratory  variability, suggesting right atrial pressure of 15 mmHg.   Patient Profile     54 y.o. 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 2017.  Assessment & Plan    1. Acute on chronic combined heart failure: -Patient self discontinued diuretic 2 weeks ago and presented with worsening LLE and SOB>>>improved with IV Lasix  -Continue IV Lasix 40 BID for now and monitor renal function closely  -Weight, 386lb today, down from 430lb on admission  -I&O, net negative 9.3L -Echo this admission showed EF 30-35%. Echo in 2016 showed EF 30-35% and it appears patient never had ischemic evaluation at that time.  -Continue ARB>>>consider 2017 transition if renal function stabilizes -May also consider Bidil and spiro at a later time -Will transition diltiazem to carvedilol 3.125 for now given mild volume overload>>>watch closely. If stable, can up titrate to 6.25 tomorrow   2. Cardiomyopathy: -EF 30-35% per echocardiogram this admission -Coronaries assessed  during vascular procedure 10/11/2019 found to have moderate to severe stenosis and a small PDA branch of the RCA not felt to be contributing to cardiomyopathy  -Continue medical management of CAD and cardiomyopathy as above  -Start ASA, BB, statin   3. Nonhealing Diabetic left foot wound: -Podiatry with  debridement with Dr. Ardelle Anton. There was concern for stenosis on recent duplex therefore he underwent angiography with no intervention -On abx per IM  4. Paroxysmal Afib, now Afib RVR: -On admission noted to be in Afib RVR and started on IV cardizem and IV heparin -CHADSVASC = 4 (HTN, CHF, DM, PVD) -Would favor BB for rate control over Cardizem given low EF>>transition to carvedilol today  -Given podiatry and possible vascular procedure would continue with IV heparin and later transition to DOAC  -Will wait for ok per podiatry and VVS prior to transition to DOAC from Heparin   5. HTN: -Stable, 128/80>123/82>138/99 -See medication plan as above   6. NSVT: - BB as above  7. DM2: -SSI per IM  8. HLD: -LDL, 65 -Transition to atorvastatin high intensity given moderate CAD   9. AKI -Creatinine, 1.26 today improving with diuresis -Watch with daily BMET    Signed, Georgie Chard NP-C [facility] Pager: [phone] 10/12/2019, 8:44 AM    I have examined the patient and reviewed assessment and plan and discussed with patient.  Agree with above as stated.  Did well with cath.  Continue IV diuresis.  AFib is rate controlled.  Add oral anticoagulation when safe from a surgical standpoint.  Agree with beta blocker for low EF and rate control.  Lance Muss   For questions or updates, please contact   Please consult [email or web address] for contact info under Cardiology/STEMI.

## 2019-10-12 NOTE — Progress Notes (Addendum)
  Progress Note    10/12/2019 10:33 AM 1 Day Post-Op  Subjective:  No complaints regarding lower extremities. Complaining of left eye pain   Vitals:   10/12/19 0419 10/12/19 0749  BP: (!) 157/80   Pulse: 87   Resp: 20   Temp: 98.3 F (36.8 C) 99.3 F (37.4 C)  SpO2: 96%    Physical Exam: General: obese, not in any distress Lungs:non labored Extremities:  2+ femoral pulses bilaterally.Right femoral access site, clean ,dry and intact without hematoma or ecchymosis. Left foot dressed. Dopper PT signal. Left foot warm, toes cool. Right lower extremity well perfused, warm. Right foot and toes cool. Doppler PT/ DP/ [facility] Neu[facility]ic: Alert and oriented  CBC    Component Value Date/Time   WBC 10.9 (H) 10/12/2019 0152   RBC 4.16 (L) 10/12/2019 0152   HGB 12.9 (L) 10/12/2019 0152   HCT 39.6 10/12/2019 0152   PLT 266 10/12/2019 0152   MCV 95.2 10/12/2019 0152   MCH 31.0 10/12/2019 0152   MCHC 32.6 10/12/2019 0152   RDW 13.3 10/12/2019 0152   LYMPHSABS 1.8 10/08/2019 1520   MONOABS 1.0 10/08/2019 1520   EOSABS 0.1 10/08/2019 1520   BASOSABS 0.0 10/08/2019 1520    BMET    Component Value Date/Time   NA 135 10/12/2019 0152   K 4.7 10/12/2019 0152   CL 99 10/12/2019 0152   CO2 25 10/12/2019 0152   GLUCOSE 292 (H) 10/12/2019 0152   BUN 22 (H) 10/12/2019 0152   CREATININE 1.26 (H) 10/12/2019 0152   CALCIUM 8.3 (L) 10/12/2019 0152   GFRNONAA >60 10/12/2019 0152   GFRAA >60 10/12/2019 0152    INR    Component Value Date/Time   INR 1.3 (H) 10/11/2019 0356     Intake/Output Summary (Last 24 hours) at 10/12/2019 1033 Last data filed at 10/12/2019 0856 Gross per 24 hour  Intake 2413.71 ml  Output 3020 ml  Net -606.29 ml     Assessment/Plan:  54 y.o. male is s/p Aortogram, selection of left superficial femoral artery and left lower extremity angiography by Dr. Randie Heinz 1 Day Post-Op. Left lower extremity adequately perfused with tibial disease. Main flow via the posterior  tibial and peroneal arteries. He is s/p left foot wound debridement on 10/10/19 by Podiatry. Afebrile. WBC count 10.9. He remains on Zosyn and Vancomycin. Still at risk for limb loss due to extent of left foot wound. Continue current wound care.   DVT prophylaxis:  Heparin   Graceann Congress, PA-C [facility] [phone] 10/12/2019 10:33 AM   I agree with the above.  Stable s/p angio.  He has adequate bloodflow to his foot.  Will sign off  Wells Alejandro Adcox

## 2019-10-12 NOTE — Care Management Important Message (Signed)
Important Message  Patient Details  Name: Maurice Nguyen MRN: [id number] Date of Birth: 02-May-1966   Medicare Important Message Given:  Yes     Renie Ora 10/12/2019, 1:30 PM

## 2019-10-12 NOTE — Progress Notes (Signed)
PROGRESS NOTE  Maurice Nguyen:[id number] DOB: Jun 22, 1966   PCP: Bernerd Limbo, MD  Patient is from: home.  Bedbound?  DOA: 10/08/2019 LOS: 3  Brief Narrative / Interim history: 54 yo AA male with history of uncontrolled DM-2 with left foot wound, HTN, systolic HF EF 81-19%, paroxysmal A fib and debility presenting with worsening left foot infection and worsening BLE edema.  He has been followed by Dr. [name] of Podiatry, was scheduled also to consult with vascular surgery Dr. [name] on 3/18.  Patient stopped his diuretics for about 2 weeks as he was unable to get to the bathroom.   CT left foot did not reveal focal fluid collection or abscess, no CT findings of osteo.  He was also found to be in A. fib with RVR and started on Cardizem drip and IV Lasix.   He was admitted for diabetic left foot wound in the setting of possible PAD, combined CHF exacerbation and A. fib with RVR.  Podiatry, vascular surgery and cardiology following.  Patient underwent debridement of left diabetic foot ulcer by podiatry on 3/17.  Then he  underwent LLE angiogram and LHC on 3/18.  LLE angiogram revealed occlusion of posterior tibial artery in mid calf with reconstitution via peroneal which is the main runoff to the foot but no flow obstructing stenosis proximally.  LHC with moderately severe stenosis in a small posterolateral branch of RCA but no other significant CAD.  Patient had left-sided eye pain and blurry vision after her catheterizations.  Also mild tenderness over left temporal area.  CRP and ESR elevated but difficult to interpret in the setting of diabetic foot infection.  Eye exam reassuring  Subjective: Seen and examined earlier this morning.  Complaining of pain behind his left eye and blurry vision since yesterday evening.  No history of this in the past.  No headache or focal neuro symptoms.  He denies chest pain, dyspnea, GI or UTI symptoms.  Objective: Vitals:   10/11/19 2318 10/12/19  0419 10/12/19 0749 10/12/19 1310  BP: 128/80 (!) 157/80  129/89  Pulse: 89 87  90  Resp: 20 20    Temp: 98.4 F (36.9 C) 98.3 F (36.8 C) 99.3 F (37.4 C)   TempSrc: Oral  Oral   SpO2: 93% 96%    Weight:  (!) 175.3 kg    Height:        Intake/Output Summary (Last 24 hours) at 10/12/2019 1513 Last data filed at 10/12/2019 1000 Gross per 24 hour  Intake 2213.71 ml  Output 3700 ml  Net -1486.29 ml   Filed Weights   10/10/19 1118 10/11/19 0657 10/12/19 0419  Weight: (!) 188.4 kg (!) 188 kg (!) 175.3 kg    Examination:  GENERAL: No acute distress.  Appears well.  HEENT: MMM.  Vision and hearing grossly intact.  NECK: Supple.  Difficult to assess JVD due to body habitus. RESP:  No IWOB.  Fair aeration but difficult exam due to body habitus. CVS:  RRR. Heart sounds normal.  ABD/GI/GU: Bowel sounds present. Soft. Non tender.  MSK/EXT: BLE chronic venous insufficiency with lymphedema SKIN: BLE stasis dermatitis.  Dressing over left foot.  Dressing over the right groin. NEURO: Awake, alert and oriented appropriately.  No apparent focal neuro deficit. PSYCH: Calm.  Flat affect.  GENERAL: No acute distress.  Appears well.  HEENT: MMM.  PERRL.  EOMI.  Reports "slight" blurry vision that does not change with covering and uncovering.  No photophobia.  Mild tenderness over  temporal area. NECK: Supple.  No apparent JVD.  RESP: On room air.  No IWOB.  Fair aeration but limited exam due to body habitus. CVS:  RRR. Heart sounds normal.  ABD/GI/GU: Bowel sounds present. Soft. Non tender.  MSK/EXT:  Moves extremities. No apparent deformity.  Trace edema. SKIN: BLE stasis dermatitis.  Dressing over left foot. NEURO: Awake, alert and oriented appropriately.  No apparent focal neuro deficit. PSYCH: Calm. Normal affect.  Procedures:  3/17-left foot wound debridement by Dr. [name]  3/18-LLE angiogram revealed occlusion of posterior tibial artery in mid calf with reconstitution via peroneal  which is the main runoff to the foot but no flow obstructing stenosis proximally.    3/18-LHC with moderately severe stenosis in a small posterolateral branch of RCA but no other significant CAD.   Assessment & Plan: Diabetic foot wound/abscess with peripheral artery disease-ABI completed as an outpatient on 3/3 which revealed diminished flow to the left foot. CRP 12, sed rate 53.  Blood cultures negative so far. -Debridement by podiatry, Dr. [name] on 10/10/2019 -LLE angiogram as above. -Augmentin --> Vanco/zosyn  -Follow wound cultures and adjust antibiotic as appropriate  A. fib RVR: Likely due to fluid overload.  Rate controlled on p.o. Cardizem -On p.o. Cardizem but prefer beta-blocker in the setting of CHF-defer to cardiology -On IV heparin for anticoagulation  Acute on chronic combined systolic and diastolic heart failure: Echo with EF of 30 to 35% (unchanged from 2016), indeterminate LV diastolic and RV function, moderate LAE and ascending aortic dilation to 39 mm.  LHC as above.  Responding to IV Lasix.  Renal function is stable. -On IV Lasix 40 mg twice daily per cardiology -GDMT-losartan -Benefits from ICD when infection is under good control? -Monitor fluid status, renal function and electrolytes -Sodium and fluid restriction  Left thigh pain/blurry vision-eye exam reassuring.  Blurry vision does not change with covering or uncovering.  Although the complaint is in his left eye, he says his vision is blurry in the right eye when he covers his left eye.  Has mild left temporal tenderness on exam.  He is CRP and ESR are elevated but difficult to interpret in the setting of his foot infection. -Consider imaging if worse.  Uncontrolled DM-2 with hyperglycemia and diabetic foot ulcer: A1c 11.2%.  Hyperglycemia likely due to dexamethasone he received.  Recent Labs    10/11/19 2107 10/12/19 0618 10/12/19 1302  GLUCAP 288* 300* 255*  -Continue NPH 40 units twice daily,  SSI-high.   -Add NovoLog AC 4 units and nightly coverage. -Continue statin -Could benefit from newer agents with cardiovascular benefits  AKI/azotemia: Baseline Cr 0.9-1.1> 1.45 (admit)> 1.18> 1.29>1.26 -Continue monitoring while on diuretics and ARB  OSA on CPAP -Continue nightly CPAP  Morbid obesity: Body mass index is 49.62 kg/m. -May benefit from dietary intervention and GLP-1 inhibitors. -Reportedly bedbound  Debility/physical deconditioning-reportedly bedbound at baseline. -PT/OT -May need a wheelchair.        Nutrition Problem: Increased nutrient needs Etiology: wound healing  Signs/Symptoms: estimated needs  Interventions: MVI, Premier Protein, Refer to RD note for recommendations   DVT prophylaxis: On heparin for A. fib Code Status: Full code Family Communication: Updated patient's wife at bedside on 3/18.  Discharge barrier: On IV antibiotics for LE diabetic wound, acute CHF on IV Lasix, atrial fibrillation Patient is from: Home Final disposition: To be determined  Consultants: Podiatry, cardiology, vascular surgery   Microbiology summarized: COVID-19 negative MRSA PCR negative Blood cultures negative Tissue culture pending  Sch Meds:  Scheduled Meds: . aspirin EC  81 mg Oral Daily  . atorvastatin  80 mg Oral q1800  . carvedilol  3.125 mg Oral BID WC  . furosemide  40 mg Intravenous BID  . insulin aspart  0-20 Units Subcutaneous TID WC  . insulin NPH Human  40 Units Subcutaneous BID AC & HS  . losartan  25 mg Oral Daily  . multivitamin with minerals  1 tablet Oral Daily  . Ensure Max Protein  11 oz Oral BID  . sodium chloride flush  10-40 mL Intracatheter Q12H  . sodium chloride flush  3 mL Intravenous Q12H   Continuous Infusions: . sodium chloride    . heparin 3,150 Units/hr (10/12/19 1442)  . lactated ringers 10 mL/hr at 10/10/19 1138  . piperacillin-tazobactam (ZOSYN)  IV 3.375 g (10/12/19 1314)  . vancomycin Stopped (10/12/19 0710)     PRN Meds:.sodium chloride, acetaminophen, menthol-cetylpyridinium, ondansetron (ZOFRAN) IV, oxyCODONE-acetaminophen, sodium chloride flush, sodium chloride flush  Antimicrobials: Anti-infectives (From admission, onward)   Start     Dose/Rate Route Frequency Ordered Stop   10/10/19 0430  vancomycin (VANCOREADY) IVPB 1500 mg/300 mL     1,500 mg 150 mL/hr over 120 Minutes Intravenous Every 12 hours 10/09/19 1003     10/09/19 1200  piperacillin-tazobactam (ZOSYN) IVPB 3.375 g     3.375 g 12.5 mL/hr over 240 Minutes Intravenous Every 8 hours 10/09/19 0946     10/09/19 1015  vancomycin (VANCOCIN) 2,500 mg in sodium chloride 0.9 % 500 mL IVPB     2,500 mg 250 mL/hr over 120 Minutes Intravenous  Once 10/09/19 1003 10/09/19 1834   10/09/19 1000  amoxicillin-clavulanate (AUGMENTIN) 875-125 MG per tablet 1 tablet  Status:  Discontinued     1 tablet Oral Every 12 hours 10/08/19 2155 10/09/19 0946   10/08/19 2145  amoxicillin-clavulanate (AUGMENTIN) 875-125 MG per tablet 1 tablet     1 tablet Oral  Once 10/08/19 2130 10/08/19 2247       I have personally reviewed the following labs and images: CBC: Recent Labs  Lab 10/08/19 1520 10/09/19 0312 10/10/19 0442 10/11/19 0356 10/12/19 0152  WBC 9.3 11.6* 10.7* 8.8 10.9*  NEUTROABS 6.4  --   --   --   --   HGB 12.9* 13.1 11.7* 11.7* 12.9*  HCT 40.9 40.3 36.3* 36.2* 39.6  MCV 97.8 96.6 96.5 96.3 95.2  PLT 203 267 243 250 266   BMP &GFR Recent Labs  Lab 10/08/19 1520 10/09/19 0312 10/10/19 0442 10/10/19 1645 10/11/19 0356 10/12/19 0152  NA 135 135 136  --  137 135  K 4.3 4.6 3.5  --  3.9 4.7  CL 98 97* 97*  --  101 99  CO2 '26 27 25  ' --  27 25  GLUCOSE 127* 104* 162*  --  190* 292*  BUN 21* 18 24*  --  24* 22*  CREATININE 1.45* 1.18 1.29*  --  1.28* 1.26*  CALCIUM 8.6* 8.4* 8.1*  --  8.2* 8.3*  MG  --   --   --  1.8 2.0 2.0  PHOS  --   --   --   --  3.8 3.1   Estimated Creatinine Clearance: 111.9 mL/min (A) (by C-G formula  based on SCr of 1.26 mg/dL (H)). Liver & Pancreas: Recent Labs  Lab 10/08/19 1520 10/11/19 0356 10/12/19 0152  AST 24  --   --   ALT 27  --   --   ALKPHOS  76  --   --   BILITOT 1.1  --   --   PROT 7.2  --   --   ALBUMIN 2.8* 2.4* 2.7*   No results for input(s): LIPASE, AMYLASE in the last 168 hours. No results for input(s): AMMONIA in the last 168 hours. Diabetic: No results for input(s): HGBA1C in the last 72 hours. Recent Labs  Lab 10/11/19 1455 10/11/19 1627 10/11/19 2107 10/12/19 0618 10/12/19 1302  GLUCAP 123* 171* 288* 300* 255*   Cardiac Enzymes: No results for input(s): CKTOTAL, CKMB, CKMBINDEX, TROPONINI in the last 168 hours. No results for input(s): PROBNP in the last 8760 hours. Coagulation Profile: Recent Labs  Lab 10/11/19 0356  INR 1.3*   Thyroid Function Tests: No results for input(s): TSH, T4TOTAL, FREET4, T3FREE, THYROIDAB in the last 72 hours. Lipid Profile: Recent Labs    10/10/19 0442  CHOL 109  HDL 34*  LDLCALC 65  TRIG 49  CHOLHDL 3.2   Anemia Panel: No results for input(s): VITAMINB12, FOLATE, FERRITIN, TIBC, IRON, RETICCTPCT in the last 72 hours. Urine analysis:    Component Value Date/Time   COLORURINE AMBER (A) 10/01/2015 1652   APPEARANCEUR CLOUDY (A) 10/01/2015 1652   LABSPEC 1.027 10/01/2015 1652   PHURINE 5.5 10/01/2015 1652   GLUCOSEU >1000 (A) 10/01/2015 1652   HGBUR NEGATIVE 10/01/2015 1652   BILIRUBINUR NEGATIVE 10/01/2015 1652   KETONESUR NEGATIVE 10/01/2015 1652   PROTEINUR 30 (A) 10/01/2015 1652   UROBILINOGEN 1.0 07/07/2012 1544   NITRITE NEGATIVE 10/01/2015 1652   LEUKOCYTESUR NEGATIVE 10/01/2015 1652   Sepsis Labs: Invalid input(s): PROCALCITONIN, Aten  Microbiology: Recent Results (from the past 240 hour(s))  SARS CORONAVIRUS 2 (TAT 6-24 HRS) Nasopharyngeal Nasopharyngeal Swab     Status: None   Collection Time: 10/08/19  6:08 PM   Specimen: Nasopharyngeal Swab  Result Value Ref Range Status    SARS Coronavirus 2 NEGATIVE NEGATIVE Final    Comment: (NOTE) SARS-CoV-2 target nucleic acids are NOT DETECTED. The SARS-CoV-2 RNA is generally detectable in upper and lower respiratory specimens during the acute phase of infection. Negative results do not preclude SARS-CoV-2 infection, do not rule out co-infections with other pathogens, and should not be used as the sole basis for treatment or other patient management decisions. Negative results must be combined with clinical observations, patient history, and epidemiological information. The expected result is Negative. Fact Sheet for Patients: SugarRoll.be Fact Sheet for Healthcare Providers: https://www.woods-mathews.com/ This test is not yet approved or cleared by the [place] FDA and  has been authorized for detection and/or diagnosis of SARS-CoV-2 by FDA under an Emergency Use Authorization (EUA). This EUA will remain  in effect (meaning this test can be used) for the duration of the COVID-19 declaration under Section 56 4(b)(1) of the Act, 21 U.S.C. section 360bbb-3(b)(1), unless the authorization is terminated or revoked sooner. Performed at [facility], [place] [address], [place], [place] [address]   Blood Cultures x 2 sites     Status: None (Preliminary result)   Collection Time: 10/08/19 10:24 PM   Specimen: BLOOD  Result Value Ref Range Status   Specimen Description BLOOD RIGHT UPPER ARM  Final   Special Requests   Final    BOTTLES DRAWN AEROBIC AND ANAEROBIC Blood Culture results may not be optimal due to an inadequate volume of blood received in culture bottles   Culture   Final    NO GROWTH 4 DAYS Performed at [facility], [place]  [address] [place], [place] [address]    Report Status PENDING  Incomplete  Blood Cultures x 2 sites     Status: None (Preliminary result)   Collection Time: 10/08/19 10:45 PM   Specimen: BLOOD  Result Value Ref Range Status    Specimen Description BLOOD LEFT ARM  Final   Special Requests   Final    BOTTLES DRAWN AEROBIC AND ANAEROBIC Blood Culture results may not be optimal due to an inadequate volume of blood received in culture bottles   Culture   Final    NO GROWTH 4 DAYS Performed at [facility], [place] [address], [place], [place] [address]    Report Status PENDING  Incomplete  MRSA PCR Screening     Status: None   Collection Time: 10/09/19  3:12 AM   Specimen: Nasopharyngeal  Result Value Ref Range Status   MRSA by PCR NEGATIVE NEGATIVE Final    Comment:        The GeneXpert MRSA Assay (FDA approved for NASAL specimens only), is one component of a comprehensive MRSA colonization surveillance program. It is not intended to diagnose MRSA infection nor to guide or monitor treatment for MRSA infections. Performed at [facility], [place] [address], [place], [place] [address]   Aerobic/Anaerobic Culture (surgical/deep wound)     Status: None (Preliminary result)   Collection Time: 10/10/19 12:15 PM   Specimen: Wound  Result Value Ref Range Status   Specimen Description WOUND  Final   Special Requests LEFT FOOT  Final   Gram Stain   Final    FEW WBC PRESENT, PREDOMINANTLY PMN MODERATE GRAM POSITIVE COCCI    Culture   Final    FEW PROTEUS MIRABILIS HOLDING FOR POSSIBLE ANAEROBE Performed at [facility], [place] [address] [place], [place] [address]    Report Status PENDING  Incomplete   Organism ID, Bacteria PROTEUS MIRABILIS  Final      Susceptibility   Proteus mirabilis - MIC*    AMPICILLIN RESISTANT Resistant     CEFAZOLIN 32 INTERMEDIATE Intermediate     CEFEPIME <=0.12 SENSITIVE Sensitive     CEFTAZIDIME <=1 SENSITIVE Sensitive     CEFTRIAXONE <=0.25 SENSITIVE Sensitive     CIPROFLOXACIN <=0.25 SENSITIVE Sensitive     GENTAMICIN <=1 SENSITIVE Sensitive     IMIPENEM 2 SENSITIVE Sensitive     TRIMETH/SULFA <=20 SENSITIVE Sensitive     AMPICILLIN/SULBACTAM <=2 SENSITIVE  Sensitive     PIP/TAZO <=4 SENSITIVE Sensitive     * FEW PROTEUS MIRABILIS    Radiology Studies: No results found.   Gar Glance T. [place]  If 7PM-7AM, please contact night-coverage [email or web address] Password [facility] 10/12/2019, 3:13 PM

## 2019-10-12 NOTE — Progress Notes (Signed)
Subjective: POD #2 s/p left foot wound debridement, incision and drainage.  He is having no pain in the lower extremity.  Having left eye pain since last night. No pain to the foot and otherwise doing well. Denies any fevers, chills, nausea, vomiting.  Denies any calf pain, chest pain, shortness of breath currently.   Objective: AAO x3, NAD Status post debridement of ulcerations x2 on the plantar aspect the left foot with granular, fibrotic and small amount of necrotic tissue but mostly unchanged from yesterday. There is no redness, warmth, ascending cellulitis. No fluctuance, crepitance, malodor.  No pain with calf compression, warmth, erythema   Assessment: POD #2 s/p left lower extremity wound debridement/ I&D  Plan: White blood cell count 10.9 but afebrile. CRP 8.8, ESR 71. Discussed with possible return for further debridement while inpatient versus outpatient. Will monitor clinically and blood work.  For now continue local wound care.  Saline wet-to-dry was applied today followed by dry sterile dressing.  Recommended nonweightbearing.  Will continue to follow  Celesta Gentile, DPM C: [phone] O: [phone]

## 2019-10-12 NOTE — Plan of Care (Signed)
  Problem: Education: Goal: Knowledge of General Education information will improve Description: Including pain rating scale, medication(s)/side effects and non-pharmacologic comfort measures Outcome: Progressing   Problem: Clinical Measurements: Goal: Will remain free from infection Outcome: Progressing Goal: Respiratory complications will improve Outcome: Progressing Goal: Cardiovascular complication will be avoided Outcome: Progressing   Problem: Nutrition: Goal: Adequate nutrition will be maintained Outcome: Progressing   Problem: Coping: Goal: Level of anxiety will decrease Outcome: Progressing   Problem: Safety: Goal: Ability to remain free from injury will improve Outcome: Progressing

## 2019-10-12 NOTE — Progress Notes (Signed)
Pharmacy Antibiotic Note  DEIONTE Nguyen is a 54 y.o. male admitted on 10/08/2019 with DFI w/PAD of left foot, CHF exacerbation, and a fib with RVR. He underwent debridement of L diabetic foot ulcer by podiatry on 3/17.  LLE angiogram revealed occlusion of posterior tibial artery in mid calf with reconstitution via peroneal, which is the main runoff to the foot, but no flow obstructing stenosis proximally. Pt has been receiving vancomycin/Zosyn; pharmacy is now consulted to dose Unasyn for pt's DFI.    WBC 10.9, afebrile, Scr 1.26, CrCl 111.9 ml/min (renal function stable)  Plan: Unasyn 3 gm IV Q 6 hrs Monitor WBC, temp, clinical improvement, cultures, renal function  Height: 6\' 2"  (188 cm) Weight: (!) 386 lb 7.5 oz (175.3 kg) IBW/kg (Calculated) : 82.2  Temp (24hrs), Avg:98.6 F (37 C), Min:98.3 F (36.8 C), Max:99.3 F (37.4 C)  Recent Labs  Lab 10/08/19 1520 10/09/19 0312 10/10/19 0442 10/11/19 0356 10/12/19 0152  WBC 9.3 11.6* 10.7* 8.8 10.9*  CREATININE 1.45* 1.18 1.29* 1.28* 1.26*  LATICACIDVEN 1.5  --   --   --   --     Estimated Creatinine Clearance: 111.9 mL/min (A) (by C-G formula based on SCr of 1.26 mg/dL (H)).    No Known Allergies  Antimicrobials this admission: Augmentin 3/15>>3/16 Zosyn 3/16>>3/19 Vanc 3/16>>3/19  Microbiology results: 3/15 BCx X 2: NGTD 3/15 COVID, HIV: both negative 3/16 MRSA PCR: negative 3/17 L foot wound: Gram stain: few WBC (PMNs), mod GPC; cx: few Proteus mirabilis (suscept to Unasyn)  4/17, PharmD, BCPS, [facility] Clinical Pharmacist 10/12/2019 3:43 PM

## 2019-10-12 NOTE — Progress Notes (Signed)
ANTICOAGULATION CONSULT NOTE   Pharmacy Consult for heparin Indication: atrial fibrillation  No Known Allergies  Patient Measurements: Height: 6\' 2"  (188 cm) Weight: (!) 386 lb 7.5 oz (175.3 kg) IBW/kg (Calculated) : 82.2 Heparin Dosing Weight: 130kg  Vital Signs: Temp: 99.3 F (37.4 C) (03/19 0749) Temp Source: Oral (03/19 0749) BP: 157/80 (03/19 0419) Pulse Rate: 87 (03/19 0419)  Labs: Recent Labs    10/10/19 0442 10/10/19 0442 10/10/19 1043 10/11/19 0216 10/11/19 0356 10/12/19 0152 10/12/19 0916  HGB 11.7*   < >  --   --  11.7* 12.9*  --   HCT 36.3*  --   --   --  36.2* 39.6  --   PLT 243  --   --   --  250 266  --   LABPROT  --   --   --   --  16.2*  --   --   INR  --   --   --   --  1.3*  --   --   HEPARINUNFRC  --   --    < > 0.11*  --  0.49 0.56  CREATININE 1.29*  --   --   --  1.28* 1.26*  --    < > = values in this interval not displayed.    Estimated Creatinine Clearance: 111.9 mL/min (A) (by C-G formula based on SCr of 1.26 mg/dL (H)).  Assessment: 10 yoF with hx AFib admitted with RVR. Pt previously on Xarelto but has not been taking recently. Pt started on IV heparin with need for various invasive procedures.   S/p cath lab and angiography with cardiology and vascular 3/18- heparin resumed and remains therapeutic. Planning for oral anticoagulation once no further procedures needed.  Goal of Therapy:  Heparin level 0.3-0.7 units/ml Monitor platelets by anticoagulation protocol: Yes   Plan:  Continue heparin at 3150 units/hr Daily heparin level and CBC   4/18, PharmD, BCPS Clinical Pharmacist [phone] Please check AMION for all [facility] Pharmacy numbers 10/12/2019

## 2019-10-12 NOTE — Progress Notes (Signed)
ANTICOAGULATION CONSULT NOTE   Pharmacy Consult for heparin Indication: atrial fibrillation  No Known Allergies  Patient Measurements: Height: 6\' 2"  (188 cm) Weight: (!) 414 lb 7.4 oz (188 kg) IBW/kg (Calculated) : 82.2 Heparin Dosing Weight: 130kg  Vital Signs: Temp: 98.4 F (36.9 C) (03/18 2318) Temp Source: Oral (03/18 2318) BP: 128/80 (03/18 2318) Pulse Rate: 89 (03/18 2318)  Labs: Recent Labs    10/10/19 0441 10/10/19 0442 10/10/19 0442 10/10/19 1043 10/11/19 0216 10/11/19 0356 10/12/19 0152  HGB  --  11.7*   < >  --   --  11.7* 12.9*  HCT  --  36.3*  --   --   --  36.2* 39.6  PLT  --  243  --   --   --  250 266  LABPROT  --   --   --   --   --  16.2*  --   INR  --   --   --   --   --  1.3*  --   HEPARINUNFRC   < >  --   --  0.55 0.11*  --  0.49  CREATININE  --  1.29*  --   --   --  1.28* 1.26*   < > = values in this interval not displayed.    Estimated Creatinine Clearance: 116.7 mL/min (A) (by C-G formula based on SCr of 1.26 mg/dL (H)).  Assessment: 48 yoF with hx AFib admitted with RVR. Pt previously on Xarelto but has not been taking recently. Pt started on IV heparin with need for various procedures.   S/p cath lab and angiography with vascular 3/18- heparin resumed Heparin level therapeutic (0.49) on gtt at 3150 units/hr. No bleeding noted.  Goal of Therapy:  Heparin level 0.3-0.7 units/ml Monitor platelets by anticoagulation protocol: Yes   Plan:  Continue heparin at 3150 units/hr Will f/u 6 hr confirmatory heparin level  4/18, PharmD, BCPS Please see amion for complete clinical pharmacist phone list 10/12/2019 3:26 AM

## 2019-10-12 NOTE — Plan of Care (Signed)
  Problem: Education: Goal: Knowledge of General Education information will improve Description Including pain rating scale, medication(s)/side effects and non-pharmacologic comfort measures Outcome: Progressing   

## 2019-10-13 DIAGNOSIS — K5900 Constipation, unspecified: Secondary | ICD-10-CM

## 2019-10-13 LAB — GLUCOSE, CAPILLARY
Glucose-Capillary: 207 mg/dL — ABNORMAL HIGH (ref 70–99)
Glucose-Capillary: 177 mg/dL — ABNORMAL HIGH (ref 70–99)
Glucose-Capillary: 205 mg/dL — ABNORMAL HIGH (ref 70–99)
Glucose-Capillary: 155 mg/dL — ABNORMAL HIGH (ref 70–99)

## 2019-10-13 LAB — CBC
HCT: 39.1 % (ref 39.0–52.0)
Hemoglobin: 12.2 g/dL — ABNORMAL LOW (ref 13.0–17.0)
MCH: 30.7 pg (ref 26.0–34.0)
MCHC: 31.2 g/dL (ref 30.0–36.0)
MCV: 98.2 fL (ref 80.0–100.0)
Platelets: 309 10*3/uL (ref 150–400)
RBC: 3.98 MIL/uL — ABNORMAL LOW (ref 4.22–5.81)
RDW: 13.3 % (ref 11.5–15.5)
WBC: 10 10*3/uL (ref 4.0–10.5)
nRBC: 0 % (ref 0.0–0.2)

## 2019-10-13 LAB — CULTURE, BLOOD (ROUTINE X 2)
Culture: NO GROWTH
Culture: NO GROWTH

## 2019-10-13 LAB — RENAL FUNCTION PANEL
Albumin: 2.6 g/dL — ABNORMAL LOW (ref 3.5–5.0)
Anion gap: 9 (ref 5–15)
BUN: 25 mg/dL — ABNORMAL HIGH (ref 6–20)
CO2: 25 mmol/L (ref 22–32)
Calcium: 8.2 mg/dL — ABNORMAL LOW (ref 8.9–10.3)
Chloride: 102 mmol/L (ref 98–111)
Creatinine, Ser: 1.16 mg/dL (ref 0.61–1.24)
GFR calc Af Amer: >60 mL/min (ref >60)
GFR calc non Af Amer: >60 mL/min (ref >60)
Glucose, Bld: 268 mg/dL — ABNORMAL HIGH (ref 70–99)
Phosphorus: 2.9 mg/dL (ref 2.5–4.6)
Potassium: 4.1 mmol/L (ref 3.5–5.1)
Sodium: 136 mmol/L (ref 135–145)

## 2019-10-13 LAB — MAGNESIUM: Magnesium: 2 mg/dL (ref 1.7–2.4)

## 2019-10-13 LAB — HEPARIN LEVEL (UNFRACTIONATED): Heparin Unfractionated: 0.68 IU/mL (ref 0.30–0.70)

## 2019-10-13 MED ORDER — FUROSEMIDE 10 MG/ML IJ SOLN
80.0000 mg | Freq: Two times a day (BID) | INTRAMUSCULAR | Status: DC
  Administered 2019-10-13 – 2019-10-17 (×7): 80 mg via INTRAVENOUS
  Filled 2019-10-13 (×8): qty 8

## 2019-10-13 MED ORDER — BISACODYL 5 MG PO TBEC
5.0000 mg | DELAYED_RELEASE_TABLET | Freq: Every day | ORAL | Status: DC | PRN
  Administered 2019-10-13: 5 mg via ORAL
  Filled 2019-10-13: qty 1

## 2019-10-13 MED ORDER — POLYETHYLENE GLYCOL 3350 17 G PO PACK
17.0000 g | PACK | Freq: Two times a day (BID) | ORAL | Status: DC | PRN
  Administered 2019-10-13 – 2019-10-19 (×4): 17 g via ORAL
  Filled 2019-10-13 (×3): qty 1

## 2019-10-13 MED ORDER — SENNOSIDES-DOCUSATE SODIUM 8.6-50 MG PO TABS
1.0000 | ORAL_TABLET | Freq: Two times a day (BID) | ORAL | Status: DC | PRN
  Administered 2019-10-13 – 2019-10-18 (×3): 1 via ORAL
  Filled 2019-10-13 (×3): qty 1

## 2019-10-13 MED ORDER — CARVEDILOL 6.25 MG PO TABS
6.2500 mg | ORAL_TABLET | Freq: Two times a day (BID) | ORAL | Status: DC
  Administered 2019-10-13 – 2019-10-17 (×8): 6.25 mg via ORAL
  Filled 2019-10-13 (×8): qty 1

## 2019-10-13 NOTE — Progress Notes (Signed)
PROGRESS NOTE  Maurice Nguyen WNI:[id number] DOB: 1966/05/14   PCP: Bernerd Limbo, MD  Patient is from: home.  Bedbound?  DOA: 10/08/2019 LOS: 4  Brief Narrative / Interim history: 54 yo AA male with history of uncontrolled DM-2 with left foot wound, HTN, systolic HF EF 38-18%, paroxysmal A fib and debility presenting with worsening left foot infection and worsening BLE edema.  He has been followed by Dr. [name] of Podiatry, was scheduled also to consult with vascular surgery Dr. [name] on 3/18.  Patient stopped his diuretics for about 2 weeks as he was unable to get to the bathroom.   CT left foot did not reveal focal fluid collection or abscess, no CT findings of osteo.  He was also found to be in A. fib with RVR and started on Cardizem drip and IV Lasix.   He was admitted for diabetic left foot wound in the setting of possible PAD, combined CHF exacerbation and A. fib with RVR.  Podiatry, vascular surgery and cardiology following.  Patient underwent debridement of left diabetic foot ulcer by podiatry on 3/17.  Then he  underwent LLE angiogram and LHC on 3/18.  LLE angiogram revealed occlusion of posterior tibial artery in mid calf with reconstitution via peroneal which is the main runoff to the foot but no flow obstructing stenosis proximally.  LHC with moderately severe stenosis in a small posterolateral branch of RCA but no other significant CAD.  Patient had left-sided eye pain and blurry vision after her catheterizations.  Also mild tenderness over left temporal area.  CRP and ESR elevated but difficult to interpret in the setting of diabetic foot infection.  Eye pain improved.  Eye exam reassuring.  Subjective: Seen and examined earlier this morning.  No major events overnight or this morning.  He says he has not a bowel movement in the last 4 to 5 days.  Also anxious about messing his bed if he has diarrhea after bowel regimen.  Reports improvement in his eye pain and blurry vision.   Denies chest pain or shortness of breath. However, he says he gets short of breath when he tries to move.  Objective: Vitals:   10/12/19 2100 10/13/19 0028 10/13/19 0404 10/13/19 0751  BP:  (!) 141/68 106/67 (!) 134/91  Pulse:  88 95   Resp:  18 20   Temp:  98.1 F (36.7 C) 98.7 F (37.1 C) 97.6 F (36.4 C)  TempSrc:  Oral  Oral  SpO2: 97% 96% 97%   Weight:   (!) 193.5 kg   Height:        Intake/Output Summary (Last 24 hours) at 10/13/2019 1443 Last data filed at 10/13/2019 1438 Gross per 24 hour  Intake 1210.63 ml  Output 3575 ml  Net -2364.37 ml   Filed Weights   10/11/19 0657 10/12/19 0419 10/13/19 0404  Weight: (!) 188 kg (!) 175.3 kg (!) 193.5 kg    Examination:  GENERAL: No acute distress.  Appears well.  HEENT: MMM.  PERRL.  EOMI.  No conjunctival injection.  No proptosis.  Vision grossly intact. NECK: Supple.  No apparent JVD.  RESP: On RA.  No IWOB.  Fair aeration but limited exam due to body habitus. CVS: Irregular rhythm.  Normal rate. Heart sounds normal.  ABD/GI/GU: Bowel sounds present. Soft. Non tender.  MSK/EXT:  Moves extremities.  BLE stasis dermatitis.  Trace edema. SKIN: BLE stasis dermatitis.  Dressing over left foot DCI. NEURO: Awake, alert and oriented appropriately.  No apparent  focal neuro deficit. PSYCH: Calm. Normal affect.   Procedures:  3/17-left foot wound debridement by Dr. [name]  3/18-LLE angiogram revealed occlusion of posterior tibial artery in mid calf with reconstitution via peroneal which is the main runoff to the foot but no flow obstructing stenosis proximally.    3/18-LHC with moderately severe stenosis in a small posterolateral branch of RCA but no other significant CAD.   Assessment & Plan: Diabetic foot wound/abscess with peripheral artery disease-ABI completed as an outpatient on 3/3 which revealed diminished flow to the left foot. CRP 12, sed rate 53.  Blood cultures negative so far.  Tissue culture with Proteus  mirabilis. -Debridement by podiatry, Dr. [name] on 10/10/2019 -LLE angiogram as above. -Augmentin --> Vanco/zosyn> Unasyn -Plan to return to the OR for debridement and possible wound VAC on 3/22. -PT/OT eval for possible rehab placement  A. fib RVR: Likely due to fluid overload.  Rate controlled on p.o. Cardizem -On p.o. Cardizem but prefer beta-blocker in the setting of CHF-defer to cardiology -On IV heparin for anticoagulation  Acute on chronic combined systolic and diastolic heart failure: Echo with EF of 30 to 35% (unchanged from 2016), indeterminate LV diastolic and RV function, moderate LAE and ascending aortic dilation to 39 mm.  LHC as above.  Responding to IV Lasix.  Had about 4.2 L urine output in the last 24 hours.  Renal function is stable. -Cardiology managing-on IV Lasix 40 mg twice daily -GDMT-losartan BB and ASA -Benefits from ICD when infection is under good control? -Monitor fluid status, renal function and electrolytes -Sodium and fluid restriction -Appreciate dietitian counseling.  Left thigh pain/blurry vision-improved.  Exam reassuring.  ESR and CRP elevation unreliable in the setting of diabetic foot infection. -Continue monitoring.  Will consult ophthalmology or consider imaging if worse.  Uncontrolled DM-2 with hyperglycemia and diabetic foot ulcer: A1c 11.2%.  Hyperglycemia likely due to dexamethasone he received.  Hyperglycemia improving Recent Labs    10/12/19 2118 10/13/19 0619 10/13/19 1227  GLUCAP 198* 207* 177*  -Continue NPH 40 units twice daily, SSI-high and NovoLog AC 4 units -Continue statin -Could benefit from newer agents with cardiovascular benefits  AKI/azotemia: Baseline Cr 0.9-1.1> 1.45 (admit)> 1.18> 1.29>1.26> 1.16 -Continue monitoring while on diuretics and ARB  OSA on CPAP -Continue nightly CPAP  Morbid obesity: Body mass index is 54.77 kg/m. -May benefit from dietary intervention and GLP-1 inhibitors. -Reportedly  bedbound  Debility/physical deconditioning-reportedly bedbound at baseline. -PT/OT -May need a wheelchair.  Constipation -Senokot-S and MiraLAX ordered.  RN to administer twice until bowel movement        Nutrition Problem: Increased nutrient needs Etiology: wound healing  Signs/Symptoms: estimated needs  Interventions: MVI, Premier Protein, Refer to RD note for recommendations   DVT prophylaxis: On heparin for A. fib Code Status: Full code Family Communication: Updated patient's wife at bedside on 3/18.  Discharge barrier: On IV antibiotics for LE diabetic wound, acute CHF on IV Lasix, atrial fibrillation Patient is from: Home Final disposition: To be determined  Consultants: Podiatry, cardiology, vascular surgery   Microbiology summarized: COVID-19 negative MRSA PCR negative Blood cultures negative Tissue culture with Proteus mirabilis.  Sch Meds:  Scheduled Meds: . aspirin EC  81 mg Oral Daily  . atorvastatin  80 mg Oral q1800  . carvedilol  6.25 mg Oral BID WC  . furosemide  80 mg Intravenous BID  . insulin aspart  0-20 Units Subcutaneous TID WC  . insulin aspart  0-5 Units Subcutaneous QHS  . insulin  aspart  4 Units Subcutaneous TID WC  . insulin NPH Human  40 Units Subcutaneous BID AC & HS  . losartan  25 mg Oral Daily  . multivitamin with minerals  1 tablet Oral Daily  . Ensure Max Protein  11 oz Oral BID  . saccharomyces boulardii  250 mg Oral BID  . sodium chloride flush  10-40 mL Intracatheter Q12H  . sodium chloride flush  3 mL Intravenous Q12H   Continuous Infusions: . sodium chloride    . ampicillin-sulbactam (UNASYN) IV 3 g (10/13/19 0900)  . heparin 3,100 Units/hr (10/13/19 0857)  . lactated ringers 10 mL/hr at 10/10/19 1138   PRN Meds:.sodium chloride, acetaminophen, menthol-cetylpyridinium, ondansetron (ZOFRAN) IV, oxyCODONE-acetaminophen, polyethylene glycol, senna-docusate, sodium chloride flush, sodium chloride  flush  Antimicrobials: Anti-infectives (From admission, onward)   Start     Dose/Rate Route Frequency Ordered Stop   10/12/19 2000  Ampicillin-Sulbactam (UNASYN) 3 g in sodium chloride 0.9 % 100 mL IVPB     3 g 200 mL/hr over 30 Minutes Intravenous Every 6 hours 10/12/19 1602     10/10/19 0430  vancomycin (VANCOREADY) IVPB 1500 mg/300 mL  Status:  Discontinued     1,500 mg 150 mL/hr over 120 Minutes Intravenous Every 12 hours 10/09/19 1003 10/12/19 1520   10/09/19 1200  piperacillin-tazobactam (ZOSYN) IVPB 3.375 g  Status:  Discontinued     3.375 g 12.5 mL/hr over 240 Minutes Intravenous Every 8 hours 10/09/19 0946 10/12/19 1520   10/09/19 1015  vancomycin (VANCOCIN) 2,500 mg in sodium chloride 0.9 % 500 mL IVPB     2,500 mg 250 mL/hr over 120 Minutes Intravenous  Once 10/09/19 1003 10/09/19 1834   10/09/19 1000  amoxicillin-clavulanate (AUGMENTIN) 875-125 MG per tablet 1 tablet  Status:  Discontinued     1 tablet Oral Every 12 hours 10/08/19 2155 10/09/19 0946   10/08/19 2145  amoxicillin-clavulanate (AUGMENTIN) 875-125 MG per tablet 1 tablet     1 tablet Oral  Once 10/08/19 2130 10/08/19 2247       I have personally reviewed the following labs and images: CBC: Recent Labs  Lab 10/08/19 1520 10/08/19 1520 10/09/19 0312 10/10/19 0442 10/11/19 0356 10/12/19 0152 10/13/19 0640  WBC 9.3   < > 11.6* 10.7* 8.8 10.9* 10.0  NEUTROABS 6.4  --   --   --   --   --   --   HGB 12.9*   < > 13.1 11.7* 11.7* 12.9* 12.2*  HCT 40.9   < > 40.3 36.3* 36.2* 39.6 39.1  MCV 97.8   < > 96.6 96.5 96.3 95.2 98.2  PLT 203   < > 267 243 250 266 309   < > = values in this interval not displayed.   BMP &GFR Recent Labs  Lab 10/09/19 0312 10/10/19 0442 10/10/19 1645 10/11/19 0356 10/12/19 0152 10/13/19 0640  NA 135 136  --  137 135 136  K 4.6 3.5  --  3.9 4.7 4.1  CL 97* 97*  --  101 99 102  CO2 27 25  --  '27 25 25  ' GLUCOSE 104* 162*  --  190* 292* 268*  BUN 18 24*  --  24* 22* 25*   CREATININE 1.18 1.29*  --  1.28* 1.26* 1.16  CALCIUM 8.4* 8.1*  --  8.2* 8.3* 8.2*  MG  --   --  1.8 2.0 2.0 2.0  PHOS  --   --   --  3.8 3.1 2.9  Estimated Creatinine Clearance: 128.9 mL/min (by C-G formula based on SCr of 1.16 mg/dL). Liver & Pancreas: Recent Labs  Lab 10/08/19 1520 10/11/19 0356 10/12/19 0152 10/13/19 0640  AST 24  --   --   --   ALT 27  --   --   --   ALKPHOS 76  --   --   --   BILITOT 1.1  --   --   --   PROT 7.2  --   --   --   ALBUMIN 2.8* 2.4* 2.7* 2.6*   No results for input(s): LIPASE, AMYLASE in the last 168 hours. No results for input(s): AMMONIA in the last 168 hours. Diabetic: No results for input(s): HGBA1C in the last 72 hours. Recent Labs  Lab 10/12/19 1302 10/12/19 1618 10/12/19 2118 10/13/19 0619 10/13/19 1227  GLUCAP 255* 211* 198* 207* 177*   Cardiac Enzymes: No results for input(s): CKTOTAL, CKMB, CKMBINDEX, TROPONINI in the last 168 hours. No results for input(s): PROBNP in the last 8760 hours. Coagulation Profile: Recent Labs  Lab 10/11/19 0356  INR 1.3*   Thyroid Function Tests: No results for input(s): TSH, T4TOTAL, FREET4, T3FREE, THYROIDAB in the last 72 hours. Lipid Profile: No results for input(s): CHOL, HDL, LDLCALC, TRIG, CHOLHDL, LDLDIRECT in the last 72 hours. Anemia Panel: No results for input(s): VITAMINB12, FOLATE, FERRITIN, TIBC, IRON, RETICCTPCT in the last 72 hours. Urine analysis:    Component Value Date/Time   COLORURINE AMBER (A) 10/01/2015 1652   APPEARANCEUR CLOUDY (A) 10/01/2015 1652   LABSPEC 1.027 10/01/2015 1652   PHURINE 5.5 10/01/2015 1652   GLUCOSEU >1000 (A) 10/01/2015 1652   HGBUR NEGATIVE 10/01/2015 1652   BILIRUBINUR NEGATIVE 10/01/2015 1652   KETONESUR NEGATIVE 10/01/2015 1652   PROTEINUR 30 (A) 10/01/2015 1652   UROBILINOGEN 1.0 07/07/2012 1544   NITRITE NEGATIVE 10/01/2015 1652   LEUKOCYTESUR NEGATIVE 10/01/2015 1652   Sepsis Labs: Invalid input(s): PROCALCITONIN,  [place]  Microbiology: Recent Results (from the past 240 hour(s))  SARS CORONAVIRUS 2 (TAT 6-24 HRS) Nasopharyngeal Nasopharyngeal Swab     Status: None   Collection Time: 10/08/19  6:08 PM   Specimen: Nasopharyngeal Swab  Result Value Ref Range Status   SARS Coronavirus 2 NEGATIVE NEGATIVE Final    Comment: (NOTE) SARS-CoV-2 target nucleic acids are NOT DETECTED. The SARS-CoV-2 RNA is generally detectable in upper and lower respiratory specimens during the acute phase of infection. Negative results do not preclude SARS-CoV-2 infection, do not rule out co-infections with other pathogens, and should not be used as the sole basis for treatment or other patient management decisions. Negative results must be combined with clinical observations, patient history, and epidemiological information. The expected result is Negative. Fact Sheet for Patients: SugarRoll.be Fact Sheet for Healthcare Providers: https://www.woods-mathews.com/ This test is not yet approved or cleared by the [place] FDA and  has been authorized for detection and/or diagnosis of SARS-CoV-2 by FDA under an Emergency Use Authorization (EUA). This EUA will remain  in effect (meaning this test can be used) for the duration of the COVID-19 declaration under Section 56 4(b)(1) of the Act, 21 U.S.C. section 360bbb-3(b)(1), unless the authorization is terminated or revoked sooner. Performed at [facility], [place] [address], [place], [place] [address]   Blood Cultures x 2 sites     Status: None   Collection Time: 10/08/19 10:24 PM   Specimen: BLOOD  Result Value Ref Range Status   Specimen Description BLOOD RIGHT UPPER ARM  Final  Special Requests   Final    BOTTLES DRAWN AEROBIC AND ANAEROBIC Blood Culture results may not be optimal due to an inadequate volume of blood received in culture bottles   Culture   Final    NO GROWTH 5 DAYS Performed at [facility], [place] [address], [place], [place] [address]    Report Status 10/13/2019 FINAL  Final  Blood Cultures x 2 sites     Status: None   Collection Time: 10/08/19 10:45 PM   Specimen: BLOOD  Result Value Ref Range Status   Specimen Description BLOOD LEFT ARM  Final   Special Requests   Final    BOTTLES DRAWN AEROBIC AND ANAEROBIC Blood Culture results may not be optimal due to an inadequate volume of blood received in culture bottles   Culture   Final    NO GROWTH 5 DAYS Performed at [facility], [place] [address], [place], [place] [address]    Report Status 10/13/2019 FINAL  Final  MRSA PCR Screening     Status: None   Collection Time: 10/09/19  3:12 AM   Specimen: Nasopharyngeal  Result Value Ref Range Status   MRSA by PCR NEGATIVE NEGATIVE Final    Comment:        The GeneXpert MRSA Assay (FDA approved for NASAL specimens only), is one component of a comprehensive MRSA colonization surveillance program. It is not intended to diagnose MRSA infection nor to guide or monitor treatment for MRSA infections. Performed at [facility], [place] [address], [place], [place] [address]   Aerobic/Anaerobic Culture (surgical/deep wound)     Status: None (Preliminary result)   Collection Time: 10/10/19 12:15 PM   Specimen: Wound  Result Value Ref Range Status   Specimen Description WOUND  Final   Special Requests LEFT FOOT  Final   Gram Stain   Final    FEW WBC PRESENT, PREDOMINANTLY PMN MODERATE GRAM POSITIVE COCCI    Culture   Final    FEW PROTEUS MIRABILIS HOLDING FOR POSSIBLE ANAEROBE Performed at [facility], [place] [address] [place], [place] [address]    Report Status PENDING  Incomplete   Organism ID, Bacteria PROTEUS MIRABILIS  Final      Susceptibility   Proteus mirabilis - MIC*    AMPICILLIN RESISTANT Resistant     CEFAZOLIN 32 INTERMEDIATE Intermediate     CEFEPIME <=0.12 SENSITIVE Sensitive     CEFTAZIDIME <=1 SENSITIVE Sensitive     CEFTRIAXONE  <=0.25 SENSITIVE Sensitive     CIPROFLOXACIN <=0.25 SENSITIVE Sensitive     GENTAMICIN <=1 SENSITIVE Sensitive     IMIPENEM 2 SENSITIVE Sensitive     TRIMETH/SULFA <=20 SENSITIVE Sensitive     AMPICILLIN/SULBACTAM <=2 SENSITIVE Sensitive     PIP/TAZO <=4 SENSITIVE Sensitive     * FEW PROTEUS MIRABILIS    Radiology Studies: No results found.   Nianna Igo T. [place]  If 7PM-7AM, please contact night-coverage [email or web address] Password [facility] 10/13/2019, 2:43 PM

## 2019-10-13 NOTE — Plan of Care (Signed)
Nutrition Education Note  **RD working remotely**  RD consulted for nutrition education regarding low sodium diet.  RD attempted to call pt 3 times and was unsuccessful at reaching pt.   RD provided "Low Sodium Nutrition Therapy" handout from the Academy of Nutrition and Dietetics. Handout provides examples on ways to decrease sodium intake in diet, Discourages intake of processed foods and use of salt shaker. Handout also encourages fresh fruits and vegetables as well as whole grain sources of carbohydrates to maximize fiber intake.   Additionally, handout emphasizes the role of fluids and foods to avoid.  Expect fair compliance.  Body mass index is 54.77 kg/m. Pt meets criteria for morbid obesity based on current BMI.  Current diet order is Heart Healthy, patient is consuming 0-100% of meals at this time.  Medications reviewed and include: Lasix, SSI, Novolog, Novolin N, MVI, Ensure Max BID, Florastor  Labs reviewed: CBGs 198-300   Clinical nutrition is already following patient and may provide additional information/reinforcement as necessary.  Eugene Gavia, MS, RD, LDN RD pager number and weekend/on-call pager number located in [place].

## 2019-10-13 NOTE — Progress Notes (Signed)
Progress Note  Patient Name: Maurice Nguyen Date of Encounter: 10/13/2019  Primary Cardiologist: Dr. Elease Hashimoto, MD   Subjective   Reports no chest pain or SOB. Edema improved.   Inpatient Medications    Scheduled Meds: . aspirin EC  81 mg Oral Daily  . atorvastatin  80 mg Oral q1800  . carvedilol  3.125 mg Oral BID WC  . furosemide  40 mg Intravenous BID  . insulin aspart  0-20 Units Subcutaneous TID WC  . insulin aspart  0-5 Units Subcutaneous QHS  . insulin aspart  4 Units Subcutaneous TID WC  . insulin NPH Human  40 Units Subcutaneous BID AC & HS  . losartan  25 mg Oral Daily  . multivitamin with minerals  1 tablet Oral Daily  . Ensure Max Protein  11 oz Oral BID  . saccharomyces boulardii  250 mg Oral BID  . sodium chloride flush  10-40 mL Intracatheter Q12H  . sodium chloride flush  3 mL Intravenous Q12H   Continuous Infusions: . sodium chloride    . ampicillin-sulbactam (UNASYN) IV 3 g (10/13/19 0900)  . heparin 3,100 Units/hr (10/13/19 0857)  . lactated ringers 10 mL/hr at 10/10/19 1138   PRN Meds: sodium chloride, acetaminophen, bisacodyl, menthol-cetylpyridinium, ondansetron (ZOFRAN) IV, oxyCODONE-acetaminophen, sodium chloride flush, sodium chloride flush   Vital Signs    Vitals:   10/12/19 2100 10/13/19 0028 10/13/19 0404 10/13/19 0751  BP:  (!) 141/68 106/67 (!) 134/91  Pulse:  88 95   Resp:  18 20   Temp:  98.1 F (36.7 C) 98.7 F (37.1 C) 97.6 F (36.4 C)  TempSrc:  Oral  Oral  SpO2: 97% 96% 97%   Weight:   (!) 193.5 kg   Height:        Intake/Output Summary (Last 24 hours) at 10/13/2019 0923 Last data filed at 10/13/2019 0900 Gross per 24 hour  Intake 1090.63 ml  Output 3375 ml  Net -2284.37 ml   Net neg 11.6 L      Filed Weights   10/11/19 0657 10/12/19 0419 10/13/19 0404  Weight: (!) 188 kg (!) 175.3 kg (!) 193.5 kg    Physical Exam   General: Obese, NAD Neck: JVP increased   Lungs: No rales    Cardiovascular:  Irregularly irregular with S1 S2. Abdomen: Soft  Distended  Extremities 1 + BLE edema. Radial pulses 2+ bilaterally Neuro: Alert and oriented. No focal deficits. No facial asymmetry. MAE spontaneously. Psych: Responds to questions appropriately with normal affect.    Labs    Chemistry Recent Labs  Lab 10/08/19 1520 10/09/19 0312 10/11/19 0356 10/12/19 0152 10/13/19 0640  NA 135   < > 137 135 136  K 4.3   < > 3.9 4.7 4.1  CL 98   < > 101 99 102  CO2 26   < > 27 25 25   GLUCOSE 127*   < > 190* 292* 268*  BUN 21*   < > 24* 22* 25*  CREATININE 1.45*   < > 1.28* 1.26* 1.16  CALCIUM 8.6*   < > 8.2* 8.3* 8.2*  PROT 7.2  --   --   --   --   ALBUMIN 2.8*  --  2.4* 2.7* 2.6*  AST 24  --   --   --   --   ALT 27  --   --   --   --   ALKPHOS 76  --   --   --   --  BILITOT 1.1  --   --   --   --   GFRNONAA 54*   < > >60 >60 >60  GFRAA >60   < > >60 >60 >60  ANIONGAP 11   < > 9 11 9    < > = values in this interval not displayed.     Hematology Recent Labs  Lab 10/11/19 0356 10/12/19 0152 10/13/19 0640  WBC 8.8 10.9* 10.0  RBC 3.76* 4.16* 3.98*  HGB 11.7* 12.9* 12.2*  HCT 36.2* 39.6 39.1  MCV 96.3 95.2 98.2  MCH 31.1 31.0 30.7  MCHC 32.3 32.6 31.2  RDW 13.3 13.3 13.3  PLT 250 266 309    Cardiac EnzymesNo results for input(s): TROPONINI in the last 168 hours. No results for input(s): TROPIPOC in the last 168 hours.   BNP Recent Labs  Lab 10/09/19 1755  BNP 320.8*     DDimer No results for input(s): DDIMER in the last 168 hours.   Radiology    HYBRID OR IMAGING ([facility] ONLY)  Result Date: 10/11/2019 There is no interpretation for this exam.  This order is for images obtained during a surgical procedure.  Please See "Surgeries" Tab for more information regarding the procedure.   Telemetry     Personally Reviewed  ECG    No new - Personally Reviewed  Cardiac Studies   Echo 10/09/19 1. Left ventricular ejection fraction, by estimation, is 30 to 35%. The  left  ventricle has moderately decreased function. The left ventricle  demonstrates global hypokinesis. The left ventricular internal cavity size  was mildly dilated. Left ventricular  diastolic parameters are indeterminate.  2. Right ventricular systolic function was not well visualized. The right  ventricular size is not well visualized.  3. Left atrial size was moderately dilated.  4. The mitral valve is abnormal. Trivial mitral valve regurgitation.  5. The aortic valve is tricuspid. Aortic valve regurgitation is not  visualized.  6. Aortic dilatation noted. There is mild dilatation of the ascending  aorta measuring 39 mm.  7. The inferior vena cava is dilated in size with <50% respiratory  variability, suggesting right atrial pressure of 15 mmHg.   Patient Profile     54 y.o. 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 2017.  Assessment & Plan    1. Acute on chronic systolic CHF   -Patient self discontinued diuretic 2 weeks ago and presented with worsening LLE and SOB>>>improved with IV Lasix  -Continue IV Lasix 40 BID for now and monitor renal function closely  -Weight, 386lb today, down from 430lb on admission  -I&O, net negative 11.6L -Echo this admission showed EF 30-35%. Echo in 2016 showed EF 30-35% and it appears patient never had ischemic evaluation at that time.  --Coronaries assessed during vascular procedure 10/11/2019 found to have moderate to severe stenosis and a small PDA branch of the RCA not felt to be contributing to cardiomyopathy  -Continue medical management of CAD and cardiomyopathy as above  -Start ASA, BB, losartan  3. Nonhealing Diabetic left foot wound: -Podiatry with debridement with Dr. 10/13/2019. There was concern for stenosis on  recent duplex therefore he underwent angiography with no intervention -On abx per IM  4. Paroxysmal Afib, now Afib RVR: -On admission noted to be in  Afib RVR and started on IV cardizem and IV heparin -CHADSVASC = 4 (HTN, CHF, DM, PVD) On Coreg   Will need to transition to NOAC  5. HTN: -BP is OK   6. NSVT: - BB as above  7. DM2: -SSI per IM  8. HLD: -LDL, 65 -Transition to atorvastatin high intensity given moderate CAD   9. AKI -Creatinine, 1.15    Will have dietary see pt re low NA  Dorris Carnes MD

## 2019-10-13 NOTE — Progress Notes (Signed)
ANTICOAGULATION CONSULT NOTE   Pharmacy Consult for heparin Indication: atrial fibrillation  No Known Allergies  Patient Measurements: Height: 6\' 2"  (188 cm) Weight: (!) 426 lb 9.4 oz (193.5 kg)(x2) IBW/kg (Calculated) : 82.2 Heparin Dosing Weight: 130kg  Vital Signs: Temp: 97.6 F (36.4 C) (03/20 0751) Temp Source: Oral (03/20 0751) BP: 134/91 (03/20 0751) Pulse Rate: 95 (03/20 0404)  Labs: Recent Labs    10/11/19 0216 10/11/19 0356 10/12/19 0152 10/12/19 0916 10/13/19 0640 10/13/19 0717  HGB   < > 11.7* 12.9*  --  12.2*  --   HCT  --  36.2* 39.6  --  39.1  --   PLT  --  250 266  --  309  --   LABPROT  --  16.2*  --   --   --   --   INR  --  1.3*  --   --   --   --   HEPARINUNFRC   < >  --  0.49 0.56  --  0.68  CREATININE  --  1.28* 1.26*  --  1.16  --    < > = values in this interval not displayed.    Estimated Creatinine Clearance: 128.9 mL/min (by C-G formula based on SCr of 1.16 mg/dL).  Assessment: 1 yoF with hx AFib admitted with RVR. Pt previously on Xarelto but has not been taking recently. Pt started on IV heparin with need for various invasive procedures.   S/p cath lab and angiography with cardiology and vascular 3/18- heparin resumed. Heparin level is on high end of therapeutic range at 0.68 on rate of 3150 units/hr. No signs/symptoms of bleeding per RN. No issues with infusion. Planning for oral anticoagulation once no further procedures needed.  Goal of Therapy:  Heparin level 0.3-0.7 units/ml Monitor platelets by anticoagulation protocol: Yes   Plan:  Decrease heparin to 3100 units/hr Daily heparin level and CBC Watch for signs/symptoms of bleeding   4/18, PharmD PGY1 Acute Care Pharmacy Resident Please check AMION for all [facility] numbers 10/13/2019

## 2019-10-13 NOTE — Progress Notes (Signed)
Subjective: POD #3 s/p left foot wound debridement, incision and drainage.  Doing well this morning without any overnight events. Denies any fevers, chills, nausea, vomiting.  Denies any calf pain, chest pain, shortness of breath currently.  Objective: AAO x3, NAD Status post debridement of ulcerations x2 on the plantar aspect the left foot with granular, fibrotic and minimal amount of necrotic tissue. There is no surrounding erythema, warmth, ascending cellulitis. No fluctuance, crepitance, malodor. There is an area of tenderness medial the ulceration over where there was a blister present previously. This has dried and there is no fluid collection noted.  No pain with calf compression, warmth, erythema.    Assessment: POD #3 s/p left lower extremity wound debridement/ I&D  Plan: White blood cell count 10 and afebrile. Wound is stable. Discussed while admitted to return to the OR for debridement and possible wound VAC application on Monday. He wishes to proceed. We discussed again to the debridement and postop course. Will further discuss tomorrow. Saline wet-to-dry was applied today followed by dry sterile dressing.  Recommended nonweightbearing. Due this co-morbidities and NWB status he may benefit from rehab which he is agreeable to.  Will continue to follow  Ovid Curd, DPM C: [phone] O: [phone]

## 2019-10-14 LAB — AEROBIC/ANAEROBIC CULTURE W GRAM STAIN (SURGICAL/DEEP WOUND)

## 2019-10-14 LAB — CBC
HCT: 40.9 % (ref 39.0–52.0)
Hemoglobin: 12.9 g/dL — ABNORMAL LOW (ref 13.0–17.0)
MCH: 30.8 pg (ref 26.0–34.0)
MCHC: 31.5 g/dL (ref 30.0–36.0)
MCV: 97.6 fL (ref 80.0–100.0)
Platelets: 326 10*3/uL (ref 150–400)
RBC: 4.19 MIL/uL — ABNORMAL LOW (ref 4.22–5.81)
RDW: 13.5 % (ref 11.5–15.5)
WBC: 9.7 10*3/uL (ref 4.0–10.5)
nRBC: 0 % (ref 0.0–0.2)

## 2019-10-14 LAB — GLUCOSE, CAPILLARY
Glucose-Capillary: 155 mg/dL — ABNORMAL HIGH (ref 70–99)
Glucose-Capillary: 141 mg/dL — ABNORMAL HIGH (ref 70–99)
Glucose-Capillary: 172 mg/dL — ABNORMAL HIGH (ref 70–99)
Glucose-Capillary: 194 mg/dL — ABNORMAL HIGH (ref 70–99)

## 2019-10-14 LAB — RENAL FUNCTION PANEL
Albumin: 2.6 g/dL — ABNORMAL LOW (ref 3.5–5.0)
Anion gap: 10 (ref 5–15)
BUN: 24 mg/dL — ABNORMAL HIGH (ref 6–20)
CO2: 28 mmol/L (ref 22–32)
Calcium: 8.5 mg/dL — ABNORMAL LOW (ref 8.9–10.3)
Chloride: 99 mmol/L (ref 98–111)
Creatinine, Ser: 1.07 mg/dL (ref 0.61–1.24)
GFR calc Af Amer: >60 mL/min (ref >60)
GFR calc non Af Amer: >60 mL/min (ref >60)
Glucose, Bld: 157 mg/dL — ABNORMAL HIGH (ref 70–99)
Phosphorus: 4.2 mg/dL (ref 2.5–4.6)
Potassium: 4.3 mmol/L (ref 3.5–5.1)
Sodium: 137 mmol/L (ref 135–145)

## 2019-10-14 LAB — SURGICAL PCR SCREEN
MRSA, PCR: NEGATIVE
Staphylococcus aureus: NEGATIVE

## 2019-10-14 LAB — MAGNESIUM: Magnesium: 1.9 mg/dL (ref 1.7–2.4)

## 2019-10-14 LAB — HEPARIN LEVEL (UNFRACTIONATED): Heparin Unfractionated: 0.65 IU/mL (ref 0.30–0.70)

## 2019-10-14 MED ORDER — CHLORHEXIDINE GLUCONATE CLOTH 2 % EX PADS
6.0000 | MEDICATED_PAD | Freq: Once | CUTANEOUS | Status: AC
  Administered 2019-10-14: 6 via TOPICAL

## 2019-10-14 MED ORDER — SODIUM CHLORIDE 0.9 % IV SOLN: 250.0000 mL | INTRAVENOUS | Status: DC | PRN

## 2019-10-14 MED ORDER — SODIUM CHLORIDE 0.9% FLUSH: 3.0000 mL | INTRAVENOUS | Status: DC | PRN

## 2019-10-14 MED ORDER — SODIUM CHLORIDE 0.9 % IV SOLN: INTRAVENOUS | Status: DC

## 2019-10-14 MED ORDER — ONDANSETRON HCL 4 MG/2ML IJ SOLN: 4.0000 mg | Freq: Four times a day (QID) | INTRAMUSCULAR | Status: DC | PRN

## 2019-10-14 MED ORDER — ACETAMINOPHEN 325 MG PO TABS: 650.0000 mg | ORAL_TABLET | ORAL | Status: DC | PRN

## 2019-10-14 MED ORDER — HYDRALAZINE HCL 20 MG/ML IJ SOLN: 5.0000 mg | INTRAMUSCULAR | Status: DC | PRN

## 2019-10-14 MED ORDER — CHLORHEXIDINE GLUCONATE CLOTH 2 % EX PADS
6.0000 | MEDICATED_PAD | Freq: Once | CUTANEOUS | Status: DC
  Administered 2019-10-15: 6 via TOPICAL

## 2019-10-14 MED ORDER — LABETALOL HCL 5 MG/ML IV SOLN: 10.0000 mg | INTRAVENOUS | Status: DC | PRN

## 2019-10-14 MED ORDER — SODIUM CHLORIDE 0.9% FLUSH: 3.0000 mL | Freq: Two times a day (BID) | INTRAVENOUS | Status: DC

## 2019-10-14 NOTE — Progress Notes (Signed)
PROGRESS NOTE  Maurice Nguyen GYF:[id number] DOB: 1966-03-17   PCP: Bernerd Limbo, MD  Patient is from: home.  Bedbound?  DOA: 10/08/2019 LOS: 5  Brief Narrative / Interim history: 54 yo AA male with history of uncontrolled DM-2 with left foot wound, HTN, systolic HF EF 91-63%, paroxysmal A fib and debility presenting with worsening left foot infection and worsening BLE edema.  He has been followed by Dr. [name] of Podiatry, was scheduled also to consult with vascular surgery Dr. [name] on 3/18.  Patient stopped his diuretics for about 2 weeks as he was unable to get to the bathroom.   CT left foot did not reveal focal fluid collection or abscess, no CT findings of osteo.  He was also found to be in A. fib with RVR and started on Cardizem drip and IV Lasix.   He was admitted for diabetic left foot wound in the setting of possible PAD, combined CHF exacerbation and A. fib with RVR.  Podiatry, vascular surgery and cardiology following.  Patient underwent debridement of left diabetic foot ulcer by podiatry on 3/17.  Then he  underwent LLE angiogram and LHC on 3/18.  LLE angiogram revealed occlusion of posterior tibial artery in mid calf with reconstitution via peroneal which is the main runoff to the foot but no flow obstructing stenosis proximally.  LHC with moderately severe stenosis in a small posterolateral branch of RCA but no other significant CAD.  Patient had left-sided headache behind his eye and blurry vision after his catheterizations.  Also mild tenderness over left temporal area.  CRP and ESR elevated but difficult to interpret in the setting of diabetic foot infection.  Eye pain and vision improved.  Eye exam reassuring.  Subjective: Seen and examined earlier this morning.  Had a recurrence of left-sided headache last night that has resolved with pain medication this morning.  Vision improved.  He denies chest pain, dyspnea, GI or UTI symptoms.  Objective: Vitals:   10/14/19  0555 10/14/19 0557 10/14/19 0743 10/14/19 1123  BP:  (!) 122/100  138/83  Pulse:  94    Resp:      Temp:   98.3 F (36.8 C) 97.7 F (36.5 C)  TempSrc:   Oral Oral  SpO2:      Weight: (!) 190.3 kg     Height:        Intake/Output Summary (Last 24 hours) at 10/14/2019 1334 Last data filed at 10/14/2019 1013 Gross per 24 hour  Intake 1608.6 ml  Output 5801 ml  Net -4192.4 ml   Filed Weights   10/12/19 0419 10/13/19 0404 10/14/19 0555  Weight: (!) 175.3 kg (!) 193.5 kg (!) 190.3 kg    Examination:  GENERAL: No acute distress.  Appears well.  HEENT: MMM.  Vision and hearing grossly intact.  NECK: Supple.  No apparent JVD.  RESP: On RA.  No IWOB.  Fair aeration but limited exam due to body habitus. CVS:  RRR. Heart sounds normal.  ABD/GI/GU: Bowel sounds present. Soft. Non tender.  MSK/EXT:  Moves extremities.  BLE stasis dermatitis.  Trace edema. SKIN: BLE stasis dermatitis.  Dressing over left foot and DCI. NEURO: Awake, alert and oriented appropriately.  No apparent focal neuro deficit. PSYCH: Calm. Normal affect.  Procedures:  3/17-left foot wound debridement by Dr. [name]  3/18-LLE angiogram revealed occlusion of posterior tibial artery in mid calf with reconstitution via peroneal which is the main runoff to the foot but no flow obstructing stenosis proximally.  3/18-LHC with moderately severe stenosis in a small posterolateral branch of RCA but no other significant CAD.   Assessment & Plan: Diabetic foot wound/abscess with peripheral artery disease-ABI completed as an outpatient on 3/3 which revealed diminished flow to the left foot. CRP 12, sed rate 53.  Blood cultures negative so far.  Tissue culture with Proteus mirabilis. -Debridement by podiatry, Dr. [name] on 10/10/2019 -LLE angiogram as above. -Augmentin --> Vanco/zosyn> Unasyn -Plan to return to the OR for debridement and possible wound VAC on 3/22. -PT/OT eval for possible rehab placement  A. fib RVR:  Likely due to fluid overload.  Rate controlled on p.o. Cardizem -On p.o. Cardizem but prefer beta-blocker in the setting of CHF-defer to cardiology -On IV heparin for anticoagulation  Acute on chronic combined systolic and diastolic heart failure: Echo with EF of 30 to 35% (unchanged from 2016), indeterminate LV diastolic and RV function, moderate LAE and ascending aortic dilation to 39 mm.  LHC as above.  Responding to IV Lasix.  He had about 3.5 L UOP/24 hours.  Renal function improving. -Cardiology managing-on IV Lasix 40 mg twice daily -GDMT-losartan BB and ASA -Benefits from ICD when infection is under good control? -Monitor fluid status, renal function and electrolytes -Sodium and fluid restriction -Appreciate dietitian counseling.  Left sided headache/blurry vision-pain resolved with pain medication this morning.  Exam reassuring.  ESR and CRP elevation unreliable in the setting of diabetic foot infection. -Continue monitoring.   Uncontrolled DM-2 with hyperglycemia and diabetic foot ulcer: A1c 11.2%.  Hyperglycemia likely due to dexamethasone he received.  Hyperglycemia improving Recent Labs    10/13/19 2116 10/14/19 0615 10/14/19 1120  GLUCAP 155* 155* 141*  -Continue NPH 40 units twice daily, SSI-high and NovoLog AC 4 units -Continue statin -Could benefit from newer agents with cardiovascular benefits  AKI/azotemia: Baseline Cr 0.9-1.1> 1.45 (admit)> 1.18> 1.29>1.26> 1.07 -Continue monitoring while on diuretics and ARB  OSA not compliant with CPAP-refuses CPAP here despite counseling  Morbid obesity: Body mass index is 53.87 kg/m. -May benefit from dietary intervention and GLP-1 inhibitors. -Reportedly bedbound  Debility/physical deconditioning-reportedly bedbound at baseline. -PT/OT -May need a wheelchair.  Constipation -Senokot-S and MiraLAX ordered.  RN to administer twice until bowel movement        Nutrition Problem: Increased nutrient  needs Etiology: wound healing  Signs/Symptoms: estimated needs  Interventions: MVI, Premier Protein, Refer to RD note for recommendations   DVT prophylaxis: On heparin for A. fib Code Status: Full code Family Communication: Updated patient's wife at bedside  Discharge barrier: On IV antibiotics for LE diabetic wound, acute CHF on IV Lasix, atrial fibrillation Patient is from: Home Final disposition: To be determined  Consultants: Podiatry, cardiology, vascular surgery   Microbiology summarized: COVID-19 negative MRSA PCR negative Blood cultures negative Tissue culture with Proteus mirabilis.  Sch Meds:  Scheduled Meds: . aspirin EC  81 mg Oral Daily  . atorvastatin  80 mg Oral q1800  . carvedilol  6.25 mg Oral BID WC  . furosemide  80 mg Intravenous BID  . insulin aspart  0-20 Units Subcutaneous TID WC  . insulin aspart  0-5 Units Subcutaneous QHS  . insulin aspart  4 Units Subcutaneous TID WC  . insulin NPH Human  40 Units Subcutaneous BID AC & HS  . losartan  25 mg Oral Daily  . multivitamin with minerals  1 tablet Oral Daily  . Ensure Max Protein  11 oz Oral BID  . saccharomyces boulardii  250 mg Oral BID  .  sodium chloride flush  10-40 mL Intracatheter Q12H  . sodium chloride flush  3 mL Intravenous Q12H   Continuous Infusions: . sodium chloride    . ampicillin-sulbactam (UNASYN) IV 3 g (10/14/19 0736)  . heparin 3,100 Units/hr (10/14/19 0042)  . lactated ringers 10 mL/hr at 10/10/19 1138   PRN Meds:.sodium chloride, acetaminophen, menthol-cetylpyridinium, ondansetron (ZOFRAN) IV, oxyCODONE-acetaminophen, polyethylene glycol, senna-docusate, sodium chloride flush, sodium chloride flush  Antimicrobials: Anti-infectives (From admission, onward)   Start     Dose/Rate Route Frequency Ordered Stop   10/12/19 2000  Ampicillin-Sulbactam (UNASYN) 3 g in sodium chloride 0.9 % 100 mL IVPB     3 g 200 mL/hr over 30 Minutes Intravenous Every 6 hours 10/12/19 1602      10/10/19 0430  vancomycin (VANCOREADY) IVPB 1500 mg/300 mL  Status:  Discontinued     1,500 mg 150 mL/hr over 120 Minutes Intravenous Every 12 hours 10/09/19 1003 10/12/19 1520   10/09/19 1200  piperacillin-tazobactam (ZOSYN) IVPB 3.375 g  Status:  Discontinued     3.375 g 12.5 mL/hr over 240 Minutes Intravenous Every 8 hours 10/09/19 0946 10/12/19 1520   10/09/19 1015  vancomycin (VANCOCIN) 2,500 mg in sodium chloride 0.9 % 500 mL IVPB     2,500 mg 250 mL/hr over 120 Minutes Intravenous  Once 10/09/19 1003 10/09/19 1834   10/09/19 1000  amoxicillin-clavulanate (AUGMENTIN) 875-125 MG per tablet 1 tablet  Status:  Discontinued     1 tablet Oral Every 12 hours 10/08/19 2155 10/09/19 0946   10/08/19 2145  amoxicillin-clavulanate (AUGMENTIN) 875-125 MG per tablet 1 tablet     1 tablet Oral  Once 10/08/19 2130 10/08/19 2247       I have personally reviewed the following labs and images: CBC: Recent Labs  Lab 10/08/19 1520 10/09/19 0312 10/10/19 0442 10/11/19 0356 10/12/19 0152 10/13/19 0640 10/14/19 0406  WBC 9.3   < > 10.7* 8.8 10.9* 10.0 9.7  NEUTROABS 6.4  --   --   --   --   --   --   HGB 12.9*   < > 11.7* 11.7* 12.9* 12.2* 12.9*  HCT 40.9   < > 36.3* 36.2* 39.6 39.1 40.9  MCV 97.8   < > 96.5 96.3 95.2 98.2 97.6  PLT 203   < > 243 250 266 309 326   < > = values in this interval not displayed.   BMP &GFR Recent Labs  Lab 10/10/19 0442 10/10/19 1645 10/11/19 0356 10/12/19 0152 10/13/19 0640 10/14/19 0406  NA 136  --  137 135 136 137  K 3.5  --  3.9 4.7 4.1 4.3  CL 97*  --  101 99 102 99  CO2 25  --  '27 25 25 28  ' GLUCOSE 162*  --  190* 292* 268* 157*  BUN 24*  --  24* 22* 25* 24*  CREATININE 1.29*  --  1.28* 1.26* 1.16 1.07  CALCIUM 8.1*  --  8.2* 8.3* 8.2* 8.5*  MG  --  1.8 2.0 2.0 2.0 1.9  PHOS  --   --  3.8 3.1 2.9 4.2   Estimated Creatinine Clearance: 138.4 mL/min (by C-G formula based on SCr of 1.07 mg/dL). Liver & Pancreas: Recent Labs  Lab 10/08/19 1520  10/11/19 0356 10/12/19 0152 10/13/19 0640 10/14/19 0406  AST 24  --   --   --   --   ALT 27  --   --   --   --   Arabella Merles  76  --   --   --   --   BILITOT 1.1  --   --   --   --   PROT 7.2  --   --   --   --   ALBUMIN 2.8* 2.4* 2.7* 2.6* 2.6*   No results for input(s): LIPASE, AMYLASE in the last 168 hours. No results for input(s): AMMONIA in the last 168 hours. Diabetic: No results for input(s): HGBA1C in the last 72 hours. Recent Labs  Lab 10/13/19 1227 10/13/19 1707 10/13/19 2116 10/14/19 0615 10/14/19 1120  GLUCAP 177* 205* 155* 155* 141*   Cardiac Enzymes: No results for input(s): CKTOTAL, CKMB, CKMBINDEX, TROPONINI in the last 168 hours. No results for input(s): PROBNP in the last 8760 hours. Coagulation Profile: Recent Labs  Lab 10/11/19 0356  INR 1.3*   Thyroid Function Tests: No results for input(s): TSH, T4TOTAL, FREET4, T3FREE, THYROIDAB in the last 72 hours. Lipid Profile: No results for input(s): CHOL, HDL, LDLCALC, TRIG, CHOLHDL, LDLDIRECT in the last 72 hours. Anemia Panel: No results for input(s): VITAMINB12, FOLATE, FERRITIN, TIBC, IRON, RETICCTPCT in the last 72 hours. Urine analysis:    Component Value Date/Time   COLORURINE AMBER (A) 10/01/2015 1652   APPEARANCEUR CLOUDY (A) 10/01/2015 1652   LABSPEC 1.027 10/01/2015 1652   PHURINE 5.5 10/01/2015 1652   GLUCOSEU >1000 (A) 10/01/2015 1652   HGBUR NEGATIVE 10/01/2015 1652   BILIRUBINUR NEGATIVE 10/01/2015 1652   KETONESUR NEGATIVE 10/01/2015 1652   PROTEINUR 30 (A) 10/01/2015 1652   UROBILINOGEN 1.0 07/07/2012 1544   NITRITE NEGATIVE 10/01/2015 1652   LEUKOCYTESUR NEGATIVE 10/01/2015 1652   Sepsis Labs: Invalid input(s): PROCALCITONIN, [place]  Microbiology: Recent Results (from the past 240 hour(s))  SARS CORONAVIRUS 2 (TAT 6-24 HRS) Nasopharyngeal Nasopharyngeal Swab     Status: None   Collection Time: 10/08/19  6:08 PM   Specimen: Nasopharyngeal Swab  Result Value Ref Range Status    SARS Coronavirus 2 NEGATIVE NEGATIVE Final    Comment: (NOTE) SARS-CoV-2 target nucleic acids are NOT DETECTED. The SARS-CoV-2 RNA is generally detectable in upper and lower respiratory specimens during the acute phase of infection. Negative results do not preclude SARS-CoV-2 infection, do not rule out co-infections with other pathogens, and should not be used as the sole basis for treatment or other patient management decisions. Negative results must be combined with clinical observations, patient history, and epidemiological information. The expected result is Negative. Fact Sheet for Patients: SugarRoll.be Fact Sheet for Healthcare Providers: https://www.woods-mathews.com/ This test is not yet approved or cleared by the [place] FDA and  has been authorized for detection and/or diagnosis of SARS-CoV-2 by FDA under an Emergency Use Authorization (EUA). This EUA will remain  in effect (meaning this test can be used) for the duration of the COVID-19 declaration under Section 56 4(b)(1) of the Act, 21 U.S.C. section 360bbb-3(b)(1), unless the authorization is terminated or revoked sooner. Performed at [facility], [place] [address] [place], [place] [address]   Blood Cultures x 2 sites     Status: None   Collection Time: 10/08/19 10:24 PM   Specimen: BLOOD  Result Value Ref Range Status   Specimen Description BLOOD RIGHT UPPER ARM  Final   Special Requests   Final    BOTTLES DRAWN AEROBIC AND ANAEROBIC Blood Culture results may not be optimal due to an inadequate volume of blood received in culture bottles   Culture   Final    NO GROWTH 5  DAYS Performed at [facility], [place] [address], [place], [place] [address]    Report Status 10/13/2019 FINAL  Final  Blood Cultures x 2 sites     Status: None   Collection Time: 10/08/19 10:45 PM   Specimen: BLOOD  Result Value Ref Range Status   Specimen Description BLOOD LEFT ARM   Final   Special Requests   Final    BOTTLES DRAWN AEROBIC AND ANAEROBIC Blood Culture results may not be optimal due to an inadequate volume of blood received in culture bottles   Culture   Final    NO GROWTH 5 DAYS Performed at [facility], [place] [address] [place], [place] [address]    Report Status 10/13/2019 FINAL  Final  MRSA PCR Screening     Status: None   Collection Time: 10/09/19  3:12 AM   Specimen: Nasopharyngeal  Result Value Ref Range Status   MRSA by PCR NEGATIVE NEGATIVE Final    Comment:        The GeneXpert MRSA Assay (FDA approved for NASAL specimens only), is one component of a comprehensive MRSA colonization surveillance program. It is not intended to diagnose MRSA infection nor to guide or monitor treatment for MRSA infections. Performed at [facility], [place] [address] [place], [place] [address]   Aerobic/Anaerobic Culture (surgical/deep wound)     Status: None   Collection Time: 10/10/19 12:15 PM   Specimen: Wound  Result Value Ref Range Status   Specimen Description WOUND  Final   Special Requests LEFT FOOT  Final   Gram Stain   Final    FEW WBC PRESENT, PREDOMINANTLY PMN MODERATE GRAM POSITIVE COCCI    Culture   Final    FEW PROTEUS MIRABILIS FEW BACTEROIDES THETAIOTAOMICRON BETA LACTAMASE POSITIVE Performed at [facility], [place] [address] [place], [place] [address]    Report Status 10/14/2019 FINAL  Final   Organism ID, Bacteria PROTEUS MIRABILIS  Final      Susceptibility   Proteus mirabilis - MIC*    AMPICILLIN RESISTANT Resistant     CEFAZOLIN 32 INTERMEDIATE Intermediate     CEFEPIME <=0.12 SENSITIVE Sensitive     CEFTAZIDIME <=1 SENSITIVE Sensitive     CEFTRIAXONE <=0.25 SENSITIVE Sensitive     CIPROFLOXACIN <=0.25 SENSITIVE Sensitive     GENTAMICIN <=1 SENSITIVE Sensitive     IMIPENEM 2 SENSITIVE Sensitive     TRIMETH/SULFA <=20 SENSITIVE Sensitive     AMPICILLIN/SULBACTAM <=2 SENSITIVE Sensitive     PIP/TAZO  <=4 SENSITIVE Sensitive     * FEW PROTEUS MIRABILIS    Radiology Studies: No results found.   Corinne Goucher T. Follansbee  If 7PM-7AM, please contact night-coverage [email or web address] Password [facility] 10/14/2019, 1:34 PM

## 2019-10-14 NOTE — Progress Notes (Signed)
Progress Note  Patient Name: Maurice Nguyen Date of Encounter: 10/14/2019  Primary Cardiologist: Dr. Acie Fredrickson, MD   Subjective   Pt is tired  Denies CP  Breathing is improved    Inpatient Medications    Scheduled Meds: . aspirin EC  81 mg Oral Daily  . atorvastatin  80 mg Oral q1800  . carvedilol  6.25 mg Oral BID WC  . furosemide  80 mg Intravenous BID  . insulin aspart  0-20 Units Subcutaneous TID WC  . insulin aspart  0-5 Units Subcutaneous QHS  . insulin aspart  4 Units Subcutaneous TID WC  . insulin NPH Human  40 Units Subcutaneous BID AC & HS  . losartan  25 mg Oral Daily  . multivitamin with minerals  1 tablet Oral Daily  . Ensure Max Protein  11 oz Oral BID  . saccharomyces boulardii  250 mg Oral BID  . sodium chloride flush  10-40 mL Intracatheter Q12H  . sodium chloride flush  3 mL Intravenous Q12H   Continuous Infusions: . sodium chloride    . ampicillin-sulbactam (UNASYN) IV 3 g (10/14/19 0000)  . heparin 3,100 Units/hr (10/14/19 0042)  . lactated ringers 10 mL/hr at 10/10/19 1138   PRN Meds: sodium chloride, acetaminophen, menthol-cetylpyridinium, ondansetron (ZOFRAN) IV, oxyCODONE-acetaminophen, polyethylene glycol, senna-docusate, sodium chloride flush, sodium chloride flush   Vital Signs    Vitals:   10/14/19 0400 10/14/19 0439 10/14/19 0555 10/14/19 0557  BP: 112/75   (!) 122/100  Pulse: (!) 45 82  94  Resp: 18     Temp: 98.6 F (37 C)     TempSrc: Oral     SpO2: 98% 96%    Weight:   (!) 190.3 kg   Height:        Intake/Output Summary (Last 24 hours) at 10/14/2019 0703 Last data filed at 10/13/2019 2000 Gross per 24 hour  Intake 1040 ml  Output 3501 ml  Net -2461 ml   Net neg 13.4  L      Filed Weights   10/12/19 0419 10/13/19 0404 10/14/19 0555  Weight: (!) 175.3 kg (!) 193.5 kg (!) 190.3 kg    Physical Exam   General: Obese, NAD Neck: JVP increased   Lungs: No rales    Cardiovascular: Irregularly irregular   S1, S2     Abdomen: Soft  Distended  Extremities 1 + LLE edema. Radial pulses 2+ bilaterally Neuro: Alert and oriented. No focal deficits. No facial asymmetry. MAE spontaneously. Psych: Responds to questions appropriately with normal affect.    Labs    Chemistry Recent Labs  Lab 10/08/19 1520 10/09/19 0312 10/12/19 0152 10/13/19 0640 10/14/19 0406  NA 135   < > 135 136 137  K 4.3   < > 4.7 4.1 4.3  CL 98   < > 99 102 99  CO2 26   < > 25 25 28   GLUCOSE 127*   < > 292* 268* 157*  BUN 21*   < > 22* 25* 24*  CREATININE 1.45*   < > 1.26* 1.16 1.07  CALCIUM 8.6*   < > 8.3* 8.2* 8.5*  PROT 7.2  --   --   --   --   ALBUMIN 2.8*   < > 2.7* 2.6* 2.6*  AST 24  --   --   --   --   ALT 27  --   --   --   --   ALKPHOS 76  --   --   --   --  BILITOT 1.1  --   --   --   --   GFRNONAA 54*   < > >60 >60 >60  GFRAA >60   < > >60 >60 >60  ANIONGAP 11   < > 11 9 10    < > = values in this interval not displayed.     Hematology Recent Labs  Lab 10/12/19 0152 10/13/19 0640 10/14/19 0406  WBC 10.9* 10.0 9.7  RBC 4.16* 3.98* 4.19*  HGB 12.9* 12.2* 12.9*  HCT 39.6 39.1 40.9  MCV 95.2 98.2 97.6  MCH 31.0 30.7 30.8  MCHC 32.6 31.2 31.5  RDW 13.3 13.3 13.5  PLT 266 309 326    Cardiac EnzymesNo results for input(s): TROPONINI in the last 168 hours. No results for input(s): TROPIPOC in the last 168 hours.   BNP Recent Labs  Lab 10/09/19 1755  BNP 320.8*     DDimer No results for input(s): DDIMER in the last 168 hours.   Radiology    No results found. Telemetry    afib  PVCs  80s   Personally Reviewed  ECG    No new - Personally Reviewed  Cardiac Studies   Echo 10/09/19 1. Left ventricular ejection fraction, by estimation, is 30 to 35%. The  left ventricle has moderately decreased function. The left ventricle  demonstrates global hypokinesis. The left ventricular internal cavity size  was mildly dilated. Left ventricular  diastolic parameters are indeterminate.  2. Right  ventricular systolic function was not well visualized. The right  ventricular size is not well visualized.  3. Left atrial size was moderately dilated.  4. The mitral valve is abnormal. Trivial mitral valve regurgitation.  5. The aortic valve is tricuspid. Aortic valve regurgitation is not  visualized.  6. Aortic dilatation noted. There is mild dilatation of the ascending  aorta measuring 39 mm.  7. The inferior vena cava is dilated in size with <50% respiratory  variability, suggesting right atrial pressure of 15 mmHg.   Patient Profile     54 y.o. 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 2017.  Assessment & Plan    1. Acute on chronic systolic CHF   -Patient self discontinued diuretic 2 weeks ago and presented with worsening LLE and SOB>>>improved with IV Lasix  Net neg 13.4 L  Cr OK   -Echo this admission showed EF 30-35%. Echo in 2016 showed EF 30-35% and it appears patient never had ischemic evaluation at that time.  Watch Na   Needs Rx for sleep apnea   Tested in pst  Does not use   CAD   --Coronaries assessed during vascular procedure 10/11/2019 found to have moderate to severe stenosis and a small PDA branch of the RCA not felt to be contributing to cardiomyopathy  -Continue medical management of CAD and cardiomyopathy as above  -Start ASA, BB, losartan  3. Nonhealing Diabetic left foot wound: -Podiatry with debridement with Dr. 10/13/2019. There was concern for stenosis on recent duplex therefore he underwent angiography with no intervention -On abx per IM  4. Paroxysmal Afib, now Afib RVR: -Rates controled   -CHADSVASC = 4 (HTN, CHF, DM, PVD) On Coreg   Will need to transition to NOAC  5. HTN: -BP is still up   But labile  112-152  /       6. NSVT: - BB as above  No recurrence    7. DM2: -SSI per IM  8. HLD: -LDL, 65 Lipitor    9. AKI -Creatinine, 1.1    Will have dietary see pt re low NA  Dietrich Pates MD

## 2019-10-14 NOTE — Progress Notes (Signed)
Subjective: POD #4 s/p left foot wound debridement, incision and drainage. Denies any pain currently.  He is scheduled for debridement tomorrow.  He has no questions about the surgery tomorrow.  Still having left eye pain. Denies any fevers, chills, nausea, vomiting.  Denies any calf pain, chest pain, shortness of breath currently.  Objective: AAO x3, NAD Status post debridement of ulcerations x2 on the plantar aspect the left foot with granular, fibrotic and minimal amount of necrotic tissue.  There is no purulence identified and only small amount of serosanguineous drainage on the bandage.  There is no fluctuation crepitation there is no malodor.  No ascending cellulitis.  Mild tenderness palpation on the periphery of the wounds. No pain with calf compression, warmth, erythema.    Assessment: POD # s/p left lower extremity wound debridement/ I&D  Plan: White blood cell count 9.7 and afebrile.  Wounds appear to be stable again discomfort as well as the previous infection recommended return to the operating room for further debridement possible graft application and possible wound VAC application.  We can discussed the procedure well postoperative course including all alternatives, risks, complications.  He wishes to proceed.  He is scheduled for noon tomorrow.  Encouraged elevation.  Recommended nonweightbearing.  He also feels weak overall and discussed possible rehab which he is amendable to.  Ovid Curd, DPM C: [phone] O: [phone]

## 2019-10-14 NOTE — Progress Notes (Signed)
ANTICOAGULATION CONSULT NOTE   Pharmacy Consult for heparin Indication: atrial fibrillation  No Known Allergies  Patient Measurements: Height: 6\' 2"  (188 cm) Weight: (!) 419 lb 8.6 oz (190.3 kg) IBW/kg (Calculated) : 82.2 Heparin Dosing Weight: 130kg  Vital Signs: Temp: 98.3 F (36.8 C) (03/21 0743) Temp Source: Oral (03/21 0743) BP: 122/100 (03/21 0557) Pulse Rate: 94 (03/21 0557)  Labs: Recent Labs    10/12/19 0152 10/12/19 0152 10/12/19 0916 10/13/19 0640 10/13/19 0717 10/14/19 0406  HGB 12.9*   < >  --  12.2*  --  12.9*  HCT 39.6  --   --  39.1  --  40.9  PLT 266  --   --  309  --  326  HEPARINUNFRC 0.49   < > 0.56  --  0.68 0.65  CREATININE 1.26*  --   --  1.16  --  1.07   < > = values in this interval not displayed.    Estimated Creatinine Clearance: 138.4 mL/min (by C-G formula based on SCr of 1.07 mg/dL).  Assessment: 89 yoF with hx AFib admitted with RVR. Pt previously on Xarelto but has not been taking recently. Pt started on IV heparin with need for various invasive procedures.   S/p cath lab and angiography with cardiology and vascular 3/18- heparin resumed. Heparin level is therapeutic at 0.65 on rate of 3100 units/hr. No signs/symptoms of bleeding per RN. No issues with infusion. Planning for oral anticoagulation once no further procedures needed. Hg/Hct is stable. Plts are wnls and stable  Goal of Therapy:  Heparin level 0.3-0.7 units/ml Monitor platelets by anticoagulation protocol: Yes   Plan:  Continue heparin at 3100 units/hr Daily heparin level and CBC Watch for signs/symptoms of bleeding   4/18, PharmD PGY1 Acute Care Pharmacy Resident Please check AMION for all [facility] numbers 10/14/2019

## 2019-10-14 NOTE — Plan of Care (Signed)
  Problem: Nutrition: Goal: Adequate nutrition will be maintained Outcome: Completed/Met   Problem: Elimination: Goal: Will not experience complications related to urinary retention Outcome: Completed/Met   Problem: Safety: Goal: Ability to remain free from injury will improve Outcome: Completed/Met   Problem: Skin Integrity: Goal: Risk for impaired skin integrity will decrease Outcome: Completed/Met

## 2019-10-14 NOTE — Progress Notes (Signed)
MD, pt has been c/o a H/A that has been in his L orbital and temple, the same side he was having his blurry vision on since admission. Pt does have a hx of OSA but is not on cpap at home or in the hospital, he may benefit from c-pap.  Will continue to monitor, thanks Glenna Fellows.

## 2019-10-15 ENCOUNTER — Encounter (HOSPITAL_COMMUNITY)
Admission: EM | Disposition: A | Discharge status: Skilled Nursing Facility | Payer: Self-pay | Source: Home / Self Care | Attending: Student

## 2019-10-15 ENCOUNTER — Inpatient Hospital Stay (HOSPITAL_COMMUNITY): Payer: Medicare HMO | Admitting: Certified Registered"

## 2019-10-15 HISTORY — PX: WOUND DEBRIDEMENT: SHX247

## 2019-10-15 LAB — GLUCOSE, CAPILLARY
Glucose-Capillary: 162 mg/dL — ABNORMAL HIGH (ref 70–99)
Glucose-Capillary: 96 mg/dL (ref 70–99)
Glucose-Capillary: 70 mg/dL (ref 70–99)
Glucose-Capillary: 144 mg/dL — ABNORMAL HIGH (ref 70–99)
Glucose-Capillary: 322 mg/dL — ABNORMAL HIGH (ref 70–99)

## 2019-10-15 LAB — RENAL FUNCTION PANEL
Albumin: 2.7 g/dL — ABNORMAL LOW (ref 3.5–5.0)
Anion gap: 12 (ref 5–15)
BUN: 28 mg/dL — ABNORMAL HIGH (ref 6–20)
CO2: 27 mmol/L (ref 22–32)
Calcium: 8.8 mg/dL — ABNORMAL LOW (ref 8.9–10.3)
Chloride: 100 mmol/L (ref 98–111)
Creatinine, Ser: 1.16 mg/dL (ref 0.61–1.24)
GFR calc Af Amer: >60 mL/min (ref >60)
GFR calc non Af Amer: >60 mL/min (ref >60)
Glucose, Bld: 160 mg/dL — ABNORMAL HIGH (ref 70–99)
Phosphorus: 4.8 mg/dL — ABNORMAL HIGH (ref 2.5–4.6)
Potassium: 3.7 mmol/L (ref 3.5–5.1)
Sodium: 139 mmol/L (ref 135–145)

## 2019-10-15 LAB — VITAMIN D 25 HYDROXY (VIT D DEFICIENCY, FRACTURES): Vit D, 25-Hydroxy: 13.07 ng/mL — ABNORMAL LOW (ref 30–100)

## 2019-10-15 LAB — CBC
HCT: 41.5 % (ref 39.0–52.0)
Hemoglobin: 13.2 g/dL (ref 13.0–17.0)
MCH: 30.6 pg (ref 26.0–34.0)
MCHC: 31.8 g/dL (ref 30.0–36.0)
MCV: 96.1 fL (ref 80.0–100.0)
Platelets: 352 10*3/uL (ref 150–400)
RBC: 4.32 MIL/uL (ref 4.22–5.81)
RDW: 13.5 % (ref 11.5–15.5)
WBC: 10.2 10*3/uL (ref 4.0–10.5)
nRBC: 0.2 % (ref 0.0–0.2)

## 2019-10-15 LAB — MAGNESIUM: Magnesium: 1.9 mg/dL (ref 1.7–2.4)

## 2019-10-15 LAB — HEPARIN LEVEL (UNFRACTIONATED)
Heparin Unfractionated: 0.79 IU/mL — ABNORMAL HIGH (ref 0.30–0.70)
Heparin Unfractionated: 0.5 IU/mL (ref 0.30–0.70)

## 2019-10-15 SURGERY — DEBRIDEMENT, WOUND
Anesthesia: Monitor Anesthesia Care | Site: Foot | Laterality: Left

## 2019-10-15 MED ORDER — LIDOCAINE 2% (20 MG/ML) 5 ML SYRINGE
INTRAMUSCULAR | Status: AC
  Filled 2019-10-15: qty 5

## 2019-10-15 MED ORDER — LIDOCAINE-EPINEPHRINE 1 %-1:100000 IJ SOLN
INTRAMUSCULAR | Status: AC
  Filled 2019-10-15: qty 1

## 2019-10-15 MED ORDER — PROPOFOL 10 MG/ML IV BOLUS
INTRAVENOUS | Status: AC
  Filled 2019-10-15: qty 20

## 2019-10-15 MED ORDER — DEXAMETHASONE SODIUM PHOSPHATE 10 MG/ML IJ SOLN
INTRAMUSCULAR | Status: AC
  Filled 2019-10-15: qty 1

## 2019-10-15 MED ORDER — PHENYLEPHRINE 40 MCG/ML (10ML) SYRINGE FOR IV PUSH (FOR BLOOD PRESSURE SUPPORT)
PREFILLED_SYRINGE | INTRAVENOUS | Status: DC | PRN
  Administered 2019-10-15: 80 ug via INTRAVENOUS

## 2019-10-15 MED ORDER — PHENYLEPHRINE HCL-NACL 10-0.9 MG/250ML-% IV SOLN
INTRAVENOUS | Status: DC | PRN
  Administered 2019-10-15: 50 ug/min via INTRAVENOUS

## 2019-10-15 MED ORDER — LACTATED RINGERS IV SOLN: INTRAVENOUS | Status: DC | PRN

## 2019-10-15 MED ORDER — MIDAZOLAM HCL 2 MG/2ML IJ SOLN
INTRAMUSCULAR | Status: AC
  Filled 2019-10-15: qty 2

## 2019-10-15 MED ORDER — ONDANSETRON HCL 4 MG/2ML IJ SOLN
INTRAMUSCULAR | Status: DC | PRN
  Administered 2019-10-15: 4 mg via INTRAVENOUS

## 2019-10-15 MED ORDER — LIDOCAINE 2% (20 MG/ML) 5 ML SYRINGE
INTRAMUSCULAR | Status: DC | PRN
  Administered 2019-10-15: 60 mg via INTRAVENOUS

## 2019-10-15 MED ORDER — SODIUM CHLORIDE 0.9 % IV SOLN
3.0000 g | INTRAVENOUS | Status: AC
  Administered 2019-10-15: 3 g via INTRAVENOUS
  Filled 2019-10-15: qty 8

## 2019-10-15 MED ORDER — ONDANSETRON HCL 4 MG/2ML IJ SOLN: 4.0000 mg | Freq: Once | INTRAMUSCULAR | Status: DC | PRN

## 2019-10-15 MED ORDER — DEXAMETHASONE SODIUM PHOSPHATE 4 MG/ML IJ SOLN
INTRAMUSCULAR | Status: DC | PRN
  Administered 2019-10-15: 4 mg via INTRAVENOUS

## 2019-10-15 MED ORDER — BENZONATATE 100 MG PO CAPS
200.0000 mg | ORAL_CAPSULE | Freq: Three times a day (TID) | ORAL | Status: DC | PRN
  Administered 2019-10-15 – 2019-10-19 (×8): 200 mg via ORAL
  Filled 2019-10-15 (×8): qty 2

## 2019-10-15 MED ORDER — LIDOCAINE HCL (PF) 1 % IJ SOLN
INTRAMUSCULAR | Status: AC
  Filled 2019-10-15: qty 30

## 2019-10-15 MED ORDER — PROPOFOL 500 MG/50ML IV EMUL
INTRAVENOUS | Status: DC | PRN
  Administered 2019-10-15: 50 ug/kg/min via INTRAVENOUS

## 2019-10-15 MED ORDER — ONDANSETRON HCL 4 MG/2ML IJ SOLN
INTRAMUSCULAR | Status: AC
  Filled 2019-10-15: qty 2

## 2019-10-15 MED ORDER — BUPIVACAINE HCL 0.5 % IJ SOLN
INTRAMUSCULAR | Status: DC | PRN
  Administered 2019-10-15: 10 mL

## 2019-10-15 MED ORDER — PHENYLEPHRINE 40 MCG/ML (10ML) SYRINGE FOR IV PUSH (FOR BLOOD PRESSURE SUPPORT)
PREFILLED_SYRINGE | INTRAVENOUS | Status: AC
  Filled 2019-10-15: qty 10

## 2019-10-15 MED ORDER — SACUBITRIL-VALSARTAN 24-26 MG PO TABS
1.0000 | ORAL_TABLET | Freq: Two times a day (BID) | ORAL | Status: DC
  Administered 2019-10-15 – 2019-10-19 (×8): 1 via ORAL
  Filled 2019-10-15 (×8): qty 1

## 2019-10-15 MED ORDER — PROPOFOL 10 MG/ML IV BOLUS
INTRAVENOUS | Status: DC | PRN
  Administered 2019-10-15: 20 mg via INTRAVENOUS

## 2019-10-15 MED ORDER — MIDAZOLAM HCL 5 MG/5ML IJ SOLN
INTRAMUSCULAR | Status: DC | PRN
  Administered 2019-10-15: 1 mg via INTRAVENOUS

## 2019-10-15 MED ORDER — AMPICILLIN-SULBACTAM SODIUM 3 (2-1) G IJ SOLR
3.0000 g | INTRAMUSCULAR | Status: DC
  Filled 2019-10-15: qty 8

## 2019-10-15 MED ORDER — BUPIVACAINE HCL 0.5 % IJ SOLN
INTRAMUSCULAR | Status: AC
  Filled 2019-10-15: qty 1

## 2019-10-15 MED ORDER — LIDOCAINE HCL (PF) 1 % IJ SOLN
INTRAMUSCULAR | Status: DC | PRN
  Administered 2019-10-15: 10 mL

## 2019-10-15 MED ORDER — FENTANYL CITRATE (PF) 100 MCG/2ML IJ SOLN: 25.0000 ug | INTRAMUSCULAR | Status: DC | PRN

## 2019-10-15 MED ORDER — FENTANYL CITRATE (PF) 250 MCG/5ML IJ SOLN
INTRAMUSCULAR | Status: AC
  Filled 2019-10-15: qty 5

## 2019-10-15 MED ORDER — MEPERIDINE HCL 25 MG/ML IJ SOLN: 6.2500 mg | INTRAMUSCULAR | Status: DC | PRN

## 2019-10-15 SURGICAL SUPPLY — 40 items
BNDG CMPR 9X4 STRL LF SNTH (GAUZE/BANDAGES/DRESSINGS) ×1
BNDG ELASTIC 4X5.8 VLCR STR LF (GAUZE/BANDAGES/DRESSINGS) ×4 IMPLANT
BNDG ESMARK 4X9 LF (GAUZE/BANDAGES/DRESSINGS) ×2 IMPLANT
BNDG GAUZE ELAST 4 BULKY (GAUZE/BANDAGES/DRESSINGS) ×2 IMPLANT
CONT SPEC 4OZ CLIKSEAL STRL BL (MISCELLANEOUS) ×2 IMPLANT
COVER BACK TABLE 60X90IN (DRAPES) ×2 IMPLANT
CUFF TOURN SGL QUICK 18X4 (TOURNIQUET CUFF) ×2 IMPLANT
DRAPE IMP U-DRAPE 54X76 (DRAPES) ×2 IMPLANT
DRSG EMULSION OIL 3X3 NADH (GAUZE/BANDAGES/DRESSINGS) ×2 IMPLANT
ELECT REM PT RETURN 9FT ADLT (ELECTROSURGICAL) ×3
ELECTRODE REM PT RTRN 9FT ADLT (ELECTROSURGICAL) IMPLANT
GAUZE 4X4 16PLY RFD (DISPOSABLE) ×2 IMPLANT
GAUZE SPONGE 4X4 12PLY STRL (GAUZE/BANDAGES/DRESSINGS) ×2 IMPLANT
GLOVE BIO SURGEON STRL SZ7.5 (GLOVE) ×4 IMPLANT
GLOVE ECLIPSE 7.5 STRL STRAW (GLOVE) ×2 IMPLANT
GOWN STRL REUS W/ TWL LRG LVL3 (GOWN DISPOSABLE) IMPLANT
GOWN STRL REUS W/TWL LRG LVL3 (GOWN DISPOSABLE) ×9
KIT BASIN OR (CUSTOM PROCEDURE TRAY) ×2 IMPLANT
MATRIX WOUND 3-LAYER 5X5 (Tissue) ×1 IMPLANT
MICROMATRIX 1000MG (Tissue) ×3 IMPLANT
NDL HYPO 25X1 1.5 SAFETY (NEEDLE) IMPLANT
NDL SAFETY ECLIPSE 18X1.5 (NEEDLE) IMPLANT
NEEDLE HYPO 18GX1.5 SHARP (NEEDLE) ×3
NEEDLE HYPO 25X1 1.5 SAFETY (NEEDLE) ×3 IMPLANT
NS IRRIG 1000ML POUR BTL (IV SOLUTION) ×2 IMPLANT
PAD ABD 8X10 STRL (GAUZE/BANDAGES/DRESSINGS) ×2 IMPLANT
PADDING CAST ABS 4INX4YD NS (CAST SUPPLIES) ×2
PADDING CAST ABS COTTON 4X4 ST (CAST SUPPLIES) IMPLANT
PROBE DEBRIDE SONICVAC MISONIX (TIP) ×2 IMPLANT
SOLUTION PARTIC MCRMTRX 1000MG (Tissue) IMPLANT
SPONGE LAP 18X18 RF (DISPOSABLE) ×2 IMPLANT
STOCKINETTE 6  STRL (DRAPES) ×3
STOCKINETTE 6 STRL (DRAPES) IMPLANT
SUT ETHILON 2 0 FS 18 (SUTURE) ×2 IMPLANT
SUT VIC AB 0 CT1 27 (SUTURE) ×3
SUT VIC AB 0 CT1 27XBRD ANBCTR (SUTURE) IMPLANT
SYR 10ML LL (SYRINGE) ×2 IMPLANT
SYR CONTROL 10ML LL (SYRINGE) ×2 IMPLANT
TUBE IRRIGATION SET MISONIX (TUBING) ×2 IMPLANT
WOUND MATRIX 3-LAYER 5X5 (Tissue) ×1 IMPLANT

## 2019-10-15 NOTE — Progress Notes (Signed)
PROGRESS NOTE  Maurice Nguyen XFG:[id number] DOB: 1966-05-10   PCP: Bernerd Limbo, MD  Patient is from: home.  Bedbound?  DOA: 10/08/2019 LOS: 6  Brief Narrative / Interim history: 54 yo AA male with history of uncontrolled DM-2 with left foot wound, HTN, systolic HF EF 96-78%, paroxysmal A fib and debility presenting with worsening left foot infection and worsening BLE edema.  He has been followed by Dr. [name] of Podiatry, was scheduled also to consult with vascular surgery Dr. [name] on 3/18.  Patient stopped his diuretics for about 2 weeks as he was unable to get to the bathroom.   CT left foot did not reveal focal fluid collection or abscess, no CT findings of osteo.  He was also found to be in A. fib with RVR and started on Cardizem drip and IV Lasix.   He was admitted for diabetic left foot wound in the setting of possible PAD, combined CHF exacerbation and A. fib with RVR.  Podiatry, vascular surgery and cardiology following.  Patient underwent debridement of left diabetic foot ulcer by podiatry on 3/17.  Then he  underwent LLE angiogram and LHC on 3/18.  LLE angiogram revealed occlusion of posterior tibial artery in mid calf with reconstitution via peroneal which is the main runoff to the foot but no flow obstructing stenosis proximally.  LHC with moderately severe stenosis in a small posterolateral branch of RCA but no other significant CAD.  Patient had left-sided headache behind his eye and blurry vision after his catheterizations.  Also mild tenderness over left temporal area.  CRP and ESR elevated but difficult to interpret in the setting of diabetic foot infection.  Eye pain and vision improved.  Eye exam reassuring.  Subjective: Seen and examined earlier this morning.  No major events overnight or this morning.  Only complaints some right groin pain with cough.  He denies chest pain, dyspnea, GI or UTI symptoms.  He had about 6 L urine output in the last 24 hours.  Net -19 L  since admit per chart.  Objective: Vitals:   10/15/19 0500 10/15/19 0600 10/15/19 0700 10/15/19 0726  BP:    122/89  Pulse: 100  (!) 43 84  Resp: (!) _0 Temp:    98.5 F (36.9 C)  TempSrc:    Oral  SpO2: 92%  100% 98%  Weight:  (!) 186.2 kg    Height:        Intake/Output Summary (Last 24 hours) at 10/15/2019 1246 Last data filed at 10/15/2019 0836 Gross per 24 hour  Intake 790 ml  Output 3376 ml  Net -2586 ml   Filed Weights   10/13/19 0404 10/14/19 0555 10/15/19 0600  Weight: (!) 193.5 kg (!) 190.3 kg (!) 186.2 kg    Examination:  GENERAL: No acute distress.  Appears well.  HEENT: MMM.  Vision and hearing grossly intact.  NECK: Supple.  No apparent JVD.  RESP: On RA.  No IWOB.  Fair aeration bilaterally. CVS: IR.  Normal rate.  Heart sounds normal.  ABD/GI/GU: Bowel sounds present. Soft. Non tender.  MSK/EXT:  Moves extremities.  BLE stasis dermatitis.  Trace edema bilaterally. SKIN: BLE stasis dermatitis.  Dressing over left foot DCI. NEURO: Awake, alert and oriented appropriately.  No apparent focal neuro deficit. PSYCH: Calm. Normal affect.  Procedures:  3/17-left foot wound debridement by Dr. [name]  3/18-LLE angiogram revealed occlusion of posterior tibial artery in mid calf with reconstitution via peroneal which is the  main runoff to the foot but no flow obstructing stenosis proximally.    3/18-LHC with moderately severe stenosis in a small posterolateral branch of RCA but no other significant CAD.   Assessment & Plan: Diabetic foot wound/abscess with peripheral artery disease-ABI completed as an outpatient on 3/3 which revealed diminished flow to the left foot. CRP 12, sed rate 53.  Blood cultures negative so far.  Tissue culture with Proteus mirabilis. -Debridement by podiatry, Dr. [name] on 10/10/2019 -LLE angiogram as above. -Augmentin --> Vanco/zosyn> Unasyn -Plan to return to the OR for debridement and possible wound VAC this  afternoon -PT/OT eval for possible rehab placement  A. fib RVR: Likely due to fluid overload.  Rate controlled on p.o. Cardizem -On p.o. Cardizem but prefer beta-blocker in the setting of CHF-defer to cardiology -On IV heparin for anticoagulation  Acute on chronic combined systolic and diastolic heart failure: Echo with EF of 30 to 35% (unchanged from 2016), indeterminate LV diastolic and RV function, moderate LAE and ascending aortic dilation to 39 mm.  LHC as above.  Responding to IV Lasix.  About 4 L UOP in the last 24 hours.  Net -19 L since admit.  Renal function stable. -Cardiology managing-on IV Lasix 80 mg twice daily -GDMT-Coreg, Entresto -Could benefit from Hoffman. -Monitor fluid status, renal function and electrolytes -Sodium and fluid restriction -Appreciate dietitian counseling.  Left sided headache/blurry vision-resolved.  Exam reassuring.  ESR and CRP elevation unreliable in the setting of diabetic foot infection. -Continue monitoring.   Uncontrolled DM-2 with hyperglycemia and diabetic foot ulcer: A1c 11.2%.  Hyperglycemia likely due to dexamethasone he received.  Hyperglycemia improving Recent Labs    10/14/19 2110 10/15/19 0628 10/15/19 1056  GLUCAP 194* 162* 96  -Continue NPH 40 units twice daily, SSI-high and NovoLog AC 4 units -Continue statin -Could benefit from newer agents with cardiovascular benefits  AKI/azotemia: Baseline Cr 0.9-1.1> 1.45 (admit)> 1.18> 1.29>1.26> 1.07> 1.16 -Continue monitoring while on diuretics and Entresto  OSA not compliant with CPAP-refused CPAP here despite extensive counseling.  Now considering. -Order nightly CPAP  Morbid obesity: Body mass index is 52.7 kg/m. -May benefit from dietary intervention and GLP-1 inhibitors. -Reportedly bedbound  Debility/physical deconditioning-reportedly bedbound at baseline. -PT/OT -May need a wheelchair.  Constipation -Senokot-S and MiraLAX ordered.  RN to administer twice until  bowel movement        Nutrition Problem: Increased nutrient needs Etiology: wound healing  Signs/Symptoms: estimated needs  Interventions: MVI, Premier Protein, Refer to RD note for recommendations   DVT prophylaxis: On heparin for A. fib Code Status: Full code Family Communication: Updated patient's wife at bedside  Discharge barrier: Surgical debridement of LE diabetic wound, acute CHF on IV Lasix and atrial fibrillation Patient is from: Home Final disposition: To be determined after therapy.  Needs to be cleared by cardiology and podiatry.  Consultants: VVS (off) podiatry, cardiology   Microbiology summarized: COVID-19 negative MRSA PCR negative Blood cultures negative Tissue culture with Proteus mirabilis.  Sch Meds:  Scheduled Meds: . [MAR Hold] aspirin EC  81 mg Oral Daily  . [MAR Hold] atorvastatin  80 mg Oral q1800  . [MAR Hold] carvedilol  6.25 mg Oral BID WC  . Chlorhexidine Gluconate Cloth  6 each Topical Once  . [MAR Hold] furosemide  80 mg Intravenous BID  . [MAR Hold] insulin aspart  0-20 Units Subcutaneous TID WC  . [MAR Hold] insulin aspart  0-5 Units Subcutaneous QHS  . [MAR Hold] insulin aspart  4 Units Subcutaneous  TID WC  . [MAR Hold] insulin NPH Human  40 Units Subcutaneous BID AC & HS  . [MAR Hold] multivitamin with minerals  1 tablet Oral Daily  . [MAR Hold] Ensure Max Protein  11 oz Oral BID  . [MAR Hold] saccharomyces boulardii  250 mg Oral BID  . [MAR Hold] sacubitril-valsartan  1 tablet Oral BID  . [MAR Hold] sodium chloride flush  10-40 mL Intracatheter Q12H  . [MAR Hold] sodium chloride flush  3 mL Intravenous Q12H   Continuous Infusions: . [MAR Hold] sodium chloride    . [MAR Hold] ampicillin-sulbactam (UNASYN) IV 3 g (10/15/19 0657)  . heparin 3,000 Units/hr (10/15/19 0100)  . lactated ringers 10 mL/hr at 10/15/19 1125   PRN Meds:.[MAR Hold] sodium chloride, [MAR Hold] menthol-cetylpyridinium, [MAR Hold] ondansetron (ZOFRAN) IV,  [MAR Hold] oxyCODONE-acetaminophen, [MAR Hold] polyethylene glycol, [MAR Hold] senna-docusate, [MAR Hold] sodium chloride flush, [MAR Hold] sodium chloride flush  Antimicrobials: Anti-infectives (From admission, onward)   Start     Dose/Rate Route Frequency Ordered Stop   10/15/19 1245  Ampicillin-Sulbactam (UNASYN) injection 3 g  Status:  Discontinued     3 g Intravenous To Surgery 10/15/19 1243 10/15/19 1245   10/12/19 2000  [MAR Hold]  Ampicillin-Sulbactam (UNASYN) 3 g in sodium chloride 0.9 % 100 mL IVPB     (MAR Hold since Mon 10/15/2019 at 1116.Hold Reason: Transfer to a Procedural area.)   3 g 200 mL/hr over 30 Minutes Intravenous Every 6 hours 10/12/19 1602     10/10/19 0430  vancomycin (VANCOREADY) IVPB 1500 mg/300 mL  Status:  Discontinued     1,500 mg 150 mL/hr over 120 Minutes Intravenous Every 12 hours 10/09/19 1003 10/12/19 1520   10/09/19 1200  piperacillin-tazobactam (ZOSYN) IVPB 3.375 g  Status:  Discontinued     3.375 g 12.5 mL/hr over 240 Minutes Intravenous Every 8 hours 10/09/19 0946 10/12/19 1520   10/09/19 1015  vancomycin (VANCOCIN) 2,500 mg in sodium chloride 0.9 % 500 mL IVPB     2,500 mg 250 mL/hr over 120 Minutes Intravenous  Once 10/09/19 1003 10/09/19 1834   10/09/19 1000  amoxicillin-clavulanate (AUGMENTIN) 875-125 MG per tablet 1 tablet  Status:  Discontinued     1 tablet Oral Every 12 hours 10/08/19 2155 10/09/19 0946   10/08/19 2145  amoxicillin-clavulanate (AUGMENTIN) 875-125 MG per tablet 1 tablet     1 tablet Oral  Once 10/08/19 2130 10/08/19 2247       I have personally reviewed the following labs and images: CBC: Recent Labs  Lab 10/08/19 1520 10/09/19 0312 10/11/19 0356 10/12/19 0152 10/13/19 0640 10/14/19 0406 10/15/19 0534  WBC 9.3   < > 8.8 10.9* 10.0 9.7 10.2  NEUTROABS 6.4  --   --   --   --   --   --   HGB 12.9*   < > 11.7* 12.9* 12.2* 12.9* 13.2  HCT 40.9   < > 36.2* 39.6 39.1 40.9 41.5  MCV 97.8   < > 96.3 95.2 98.2 97.6 96.1   PLT 203   < > 250 266 309 326 352   < > = values in this interval not displayed.   BMP &GFR Recent Labs  Lab 10/11/19 0356 10/12/19 0152 10/13/19 0640 10/14/19 0406 10/15/19 0534  NA 137 135 136 137 139  K 3.9 4.7 4.1 4.3 3.7  CL 101 99 102 99 100  CO2 _0 GLUCOSE 190* 292* 268* 157* 160*  BUN  24* 22* 25* 24* 28*  CREATININE 1.28* 1.26* 1.16 1.07 1.16  CALCIUM 8.2* 8.3* 8.2* 8.5* 8.8*  MG 2.0 2.0 2.0 1.9 1.9  PHOS 3.8 3.1 2.9 4.2 4.8*   Estimated Creatinine Clearance: 126 mL/min (by C-G formula based on SCr of 1.16 mg/dL). Liver & Pancreas: Recent Labs  Lab 10/08/19 1520 10/08/19 1520 10/11/19 0356 10/12/19 0152 10/13/19 0640 10/14/19 0406 10/15/19 0534  AST 24  --   --   --   --   --   --   ALT 27  --   --   --   --   --   --   ALKPHOS 76  --   --   --   --   --   --   BILITOT 1.1  --   --   --   --   --   --   PROT 7.2  --   --   --   --   --   --   ALBUMIN 2.8*   < > 2.4* 2.7* 2.6* 2.6* 2.7*   < > = values in this interval not displayed.   No results for input(s): LIPASE, AMYLASE in the last 168 hours. No results for input(s): AMMONIA in the last 168 hours. Diabetic: No results for input(s): HGBA1C in the last 72 hours. Recent Labs  Lab 10/14/19 1120 10/14/19 1654 10/14/19 2110 10/15/19 0628 10/15/19 1056  GLUCAP 141* 172* 194* 162* 96   Cardiac Enzymes: No results for input(s): CKTOTAL, CKMB, CKMBINDEX, TROPONINI in the last 168 hours. No results for input(s): PROBNP in the last 8760 hours. Coagulation Profile: Recent Labs  Lab 10/11/19 0356  INR 1.3*   Thyroid Function Tests: No results for input(s): TSH, T4TOTAL, FREET4, T3FREE, THYROIDAB in the last 72 hours. Lipid Profile: No results for input(s): CHOL, HDL, LDLCALC, TRIG, CHOLHDL, LDLDIRECT in the last 72 hours. Anemia Panel: No results for input(s): VITAMINB12, FOLATE, FERRITIN, TIBC, IRON, RETICCTPCT in the last 72 hours. Urine analysis:    Component Value Date/Time    COLORURINE AMBER (A) 10/01/2015 1652   APPEARANCEUR CLOUDY (A) 10/01/2015 1652   LABSPEC 1.027 10/01/2015 1652   PHURINE 5.5 10/01/2015 1652   GLUCOSEU >1000 (A) 10/01/2015 1652   HGBUR NEGATIVE 10/01/2015 1652   BILIRUBINUR NEGATIVE 10/01/2015 1652   KETONESUR NEGATIVE 10/01/2015 1652   PROTEINUR 30 (A) 10/01/2015 1652   UROBILINOGEN 1.0 07/07/2012 1544   NITRITE NEGATIVE 10/01/2015 1652   LEUKOCYTESUR NEGATIVE 10/01/2015 1652   Sepsis Labs: Invalid input(s): PROCALCITONIN, [place]  Microbiology: Recent Results (from the past 240 hour(s))  SARS CORONAVIRUS 2 (TAT 6-24 HRS) Nasopharyngeal Nasopharyngeal Swab     Status: None   Collection Time: 10/08/19  6:08 PM   Specimen: Nasopharyngeal Swab  Result Value Ref Range Status   SARS Coronavirus 2 NEGATIVE NEGATIVE Final    Comment: (NOTE) SARS-CoV-2 target nucleic acids are NOT DETECTED. The SARS-CoV-2 RNA is generally detectable in upper and lower respiratory specimens during the acute phase of infection. Negative results do not preclude SARS-CoV-2 infection, do not rule out co-infections with other pathogens, and should not be used as the sole basis for treatment or other patient management decisions. Negative results must be combined with clinical observations, patient history, and epidemiological information. The expected result is Negative. Fact Sheet for Patients: SugarRoll.be Fact Sheet for Healthcare Providers: https://www.woods-mathews.com/ This test is not yet approved or cleared by the [place] FDA and  has been authorized for detection and/or  diagnosis of SARS-CoV-2 by FDA under an Emergency Use Authorization (EUA). This EUA will remain  in effect (meaning this test can be used) for the duration of the COVID-19 declaration under Section 56 4(b)(1) of the Act, 21 U.S.C. section 360bbb-3(b)(1), unless the authorization is terminated or revoked sooner. Performed at  [facility], [place] [address] [place], [place] [address]   Blood Cultures x 2 sites     Status: None   Collection Time: 10/08/19 10:24 PM   Specimen: BLOOD  Result Value Ref Range Status   Specimen Description BLOOD RIGHT UPPER ARM  Final   Special Requests   Final    BOTTLES DRAWN AEROBIC AND ANAEROBIC Blood Culture results may not be optimal due to an inadequate volume of blood received in culture bottles   Culture   Final    NO GROWTH 5 DAYS Performed at [facility], [place] [address], [place], [place] [address]    Report Status 10/13/2019 FINAL  Final  Blood Cultures x 2 sites     Status: None   Collection Time: 10/08/19 10:45 PM   Specimen: BLOOD  Result Value Ref Range Status   Specimen Description BLOOD LEFT ARM  Final   Special Requests   Final    BOTTLES DRAWN AEROBIC AND ANAEROBIC Blood Culture results may not be optimal due to an inadequate volume of blood received in culture bottles   Culture   Final    NO GROWTH 5 DAYS Performed at [facility], [place] [address] [place], [place] [address]    Report Status 10/13/2019 FINAL  Final  MRSA PCR Screening     Status: None   Collection Time: 10/09/19  3:12 AM   Specimen: Nasopharyngeal  Result Value Ref Range Status   MRSA by PCR NEGATIVE NEGATIVE Final    Comment:        The GeneXpert MRSA Assay (FDA approved for NASAL specimens only), is one component of a comprehensive MRSA colonization surveillance program. It is not intended to diagnose MRSA infection nor to guide or monitor treatment for MRSA infections. Performed at [facility], [place] [address] [place], [place] [address]   Aerobic/Anaerobic Culture (surgical/deep wound)     Status: None   Collection Time: 10/10/19 12:15 PM   Specimen: Wound  Result Value Ref Range Status   Specimen Description WOUND  Final   Special Requests LEFT FOOT  Final   Gram Stain   Final    FEW WBC PRESENT, PREDOMINANTLY PMN MODERATE GRAM POSITIVE COCCI      Culture   Final    FEW PROTEUS MIRABILIS FEW BACTEROIDES THETAIOTAOMICRON BETA LACTAMASE POSITIVE Performed at [facility], [place] [address], [place], [place] [address]    Report Status 10/14/2019 FINAL  Final   Organism ID, Bacteria PROTEUS MIRABILIS  Final      Susceptibility   Proteus mirabilis - MIC*    AMPICILLIN RESISTANT Resistant     CEFAZOLIN 32 INTERMEDIATE Intermediate     CEFEPIME <=0.12 SENSITIVE Sensitive     CEFTAZIDIME <=1 SENSITIVE Sensitive     CEFTRIAXONE <=0.25 SENSITIVE Sensitive     CIPROFLOXACIN <=0.25 SENSITIVE Sensitive     GENTAMICIN <=1 SENSITIVE Sensitive     IMIPENEM 2 SENSITIVE Sensitive     TRIMETH/SULFA <=20 SENSITIVE Sensitive     AMPICILLIN/SULBACTAM <=2 SENSITIVE Sensitive     PIP/TAZO <=4 SENSITIVE Sensitive     * FEW PROTEUS MIRABILIS  Surgical pcr screen  Status: None   Collection Time: 10/14/19  8:41 PM   Specimen: Nasal Mucosa; Nasal Swab  Result Value Ref Range Status   MRSA, PCR NEGATIVE NEGATIVE Final   Staphylococcus aureus NEGATIVE NEGATIVE Final    Comment: (NOTE) The Xpert SA Assay (FDA approved for NASAL specimens in patients 19 years of age and older), is one component of a comprehensive surveillance program. It is not intended to diagnose infection nor to guide or monitor treatment. Performed at [facility], [place] [address], [place], [place] [address]     Radiology Studies: No results found.   Vanden Fawaz T. [place]  If 7PM-7AM, please contact night-coverage [email or web address] Password [facility] 10/15/2019, 12:46 PM

## 2019-10-15 NOTE — Progress Notes (Signed)
ANTICOAGULATION CONSULT NOTE   Pharmacy Consult for heparin Indication: atrial fibrillation  Assessment: 71 yoF with hx AFib admitted with RVR. Pt previously on Xarelto but has not been taking recently. Pt started on IV heparin with need for various invasive procedures.   S/p cath lab and angiography with cardiology and vascular 3/18- heparin resumed.  Heparin level this am 0.79 units/ml  Goal of Therapy:  Heparin level 0.3-0.7 units/ml Monitor platelets by anticoagulation protocol: Yes   Plan:  Derease heparin to 3000 units/hr Check heparin level in 6 hours Daily heparin level and CBC Watch for signs/symptoms of bleeding  Thanks for allowing pharmacy to be a part of this patient's care.  Talbert Cage, PharmD Clinical Pharmacist  10/15/2019

## 2019-10-15 NOTE — Progress Notes (Signed)
No acute overnight events. No pain to the foot. Denies any fevers, chills, nausea, vomiting. No calf pain, chest pain, SOB. Scheduled for wound debridement and possible VAC and/or graft application. He has no questions about the surgery. He wants the foot to heel and be able to go back to walking normal. NPO. Will call his wife postop at his request. No further questions or concerns. Will proceed as scheduled. Consent signed.

## 2019-10-15 NOTE — Progress Notes (Signed)
ANTICOAGULATION CONSULT NOTE - Follow Up Consult  Pharmacy Consult for heparin Indication: atrial fibrillation  Labs: Recent Labs    10/13/19 0640 10/13/19 0717 10/14/19 0406 10/15/19 0534 10/15/19 2235  HGB 12.2*   < > 12.9* 13.2  --   HCT 39.1  --  40.9 41.5  --   PLT 309  --  326 352  --   HEPARINUNFRC  --    < > 0.65 0.79* 0.50  CREATININE 1.16  --  1.07 1.16  --    < > = values in this interval not displayed.    Assessment/Plan:  54yo male therapeutic on heparin after rate change. Will continue gtt at current rate and confirm stable with am labs.   Vernard Gambles, PharmD, BCPS  10/15/2019,11:24 PM

## 2019-10-15 NOTE — Progress Notes (Signed)
ANTICOAGULATION CONSULT NOTE   Pharmacy Consult for heparin Indication: atrial fibrillation  No Known Allergies  Patient Measurements: Height: 6\' 2"  (188 cm) Weight: (!) 410 lb 7.9 oz (186.2 kg) IBW/kg (Calculated) : 82.2 Heparin Dosing Weight: 130kg  Vital Signs: Temp: 98.6 F (37 C) (03/22 1425) Temp Source: Oral (03/22 1425) BP: 123/79 (03/22 1345) Pulse Rate: 89 (03/22 1345)  Labs: Recent Labs    10/13/19 0640 10/13/19 0640 10/13/19 0717 10/14/19 0406 10/15/19 0534  HGB 12.2*   < >  --  12.9* 13.2  HCT 39.1  --   --  40.9 41.5  PLT 309  --   --  326 352  HEPARINUNFRC  --   --  0.68 0.65 0.79*  CREATININE 1.16  --   --  1.07 1.16   < > = values in this interval not displayed.    Estimated Creatinine Clearance: 126 mL/min (by C-G formula based on SCr of 1.16 mg/dL).  Assessment: 35 yoF with hx AFib admitted with RVR. Pt previously on Xarelto but has not been taking recently. Pt started on IV heparin with  Procedures now s/p I&D. Heparin restarted at 3pm at 3000 units/hr. Plans to restart Xarelto when procedures have been completed   Goal of Therapy:  Heparin level 0.3-0.7 units/ml Monitor platelets by anticoagulation protocol: Yes   Plan:  Continue heparin at 300 units/hr Daily heparin level and CBC Heparin level in 6 hours and daily wth CBC daily  53, PharmD Clinical Pharmacist **Pharmacist phone directory can now be found on [email or web address] ([email or web address]).  Listed under [facility].

## 2019-10-15 NOTE — Plan of Care (Signed)
  Problem: Elimination: Goal: Will not experience complications related to bowel motility Outcome: Completed/Met

## 2019-10-15 NOTE — Anesthesia Preprocedure Evaluation (Addendum)
Anesthesia Evaluation  Patient identified by MRN, date of birth, ID band Patient awake    Reviewed: Allergy & Precautions, NPO status , Patient's Chart, lab work & pertinent test results  History of Anesthesia Complications (+) history of anesthetic complications  Airway Mallampati: II  TM Distance: >3 FB Neck ROM: Full    Dental no notable dental hx. (+) Dental Advisory Given   Pulmonary shortness of breath, sleep apnea , pneumonia,    Pulmonary exam normal breath sounds clear to auscultation       Cardiovascular hypertension, +CHF  Normal cardiovascular exam Rhythm:Regular Rate:Normal  Echo 09/2019 1. Left ventricular ejection fraction, by estimation, is 30 to 35%. The left ventricle has moderately decreased function. The left ventricle demonstrates global hypokinesis. The left ventricular internal cavity size  was mildly dilated. Left ventricular diastolic parameters are indeterminate.  2. Right ventricular systolic function was not well visualized. The right ventricular size is not well visualized.  3. Left atrial size was moderately dilated.  4. The mitral valve is abnormal. Trivial mitral valve regurgitation.  5. The aortic valve is tricuspid. Aortic valve regurgitation is not visualized.  6. Aortic dilatation noted. There is mild dilatation of the ascending aorta measuring 39 mm.  7. The inferior vena cava is dilated in size with <50% respiratory variability, suggesting right atrial pressure of 15 mmHg.    Neuro/Psych negative neurological ROS  negative psych ROS   GI/Hepatic negative GI ROS, Neg liver ROS,   Endo/Other  diabetesMorbid obesity  Renal/GU Renal disease     Musculoskeletal negative musculoskeletal ROS (+)   Abdominal   Peds  Hematology negative hematology ROS (+)   Anesthesia Other Findings   Reproductive/Obstetrics                                    Anesthesia Evaluation  Patient identified by MRN, date of birth, ID band Patient awake    Reviewed: Allergy & Precautions, NPO status , Patient's Chart, lab work & pertinent test results  Airway        Dental   Pulmonary shortness of breath, sleep apnea ,           Cardiovascular hypertension, +CHF    1. Left ventricular ejection fraction, by estimation, is 30 to 35%. The  left ventricle has moderately decreased function. The left ventricle  demonstrates global hypokinesis. The left ventricular internal cavity size  was mildly dilated. Left ventricular  diastolic parameters are indeterminate.  2. Right ventricular systolic function was not well visualized. The right  ventricular size is not well visualized.  3. Left atrial size was moderately dilated.  4. The mitral valve is abnormal. Trivial mitral valve regurgitation.  5. The aortic valve is tricuspid. Aortic valve regurgitation is not  visualized.  6. Aortic dilatation noted. There is mild dilatation of the ascending  aorta measuring 39 mm.  7. The inferior vena cava is dilated in size with <50% respiratory  variability, suggesting right atrial pressure of 15 mmHg.    Neuro/Psych    GI/Hepatic   Endo/Other  diabetesMorbid obesity  Renal/GU Renal disease     Musculoskeletal   Abdominal   Peds  Hematology  (+) Blood dyscrasia, anemia ,   Anesthesia Other Findings   Reproductive/Obstetrics  Anesthesia Physical Anesthesia Plan  ASA: IV  Anesthesia Plan: General   Post-op Pain Management:    Induction: Intravenous  PONV Risk Score and Plan: 2 and Ondansetron and Dexamethasone  Airway Management Planned:   Additional Equipment:   Intra-op Plan:   Post-operative Plan: Extubation in OR  Informed Consent: I have reviewed the patients History and Physical, chart, labs and discussed the procedure including the risks,  benefits and alternatives for the proposed anesthesia with the patient or authorized representative who has indicated his/her understanding and acceptance.     Dental advisory given  Plan Discussed with: CRNA and Surgeon  Anesthesia Plan Comments:         Anesthesia Quick Evaluation  Anesthesia Physical Anesthesia Plan  ASA: IV  Anesthesia Plan: MAC   Post-op Pain Management:    Induction: Intravenous  PONV Risk Score and Plan: 3 and Ondansetron, Propofol infusion, Treatment may vary due to age or medical condition and Midazolam  Airway Management Planned: Natural Airway  Additional Equipment: None  Intra-op Plan:   Post-operative Plan:   Informed Consent: I have reviewed the patients History and Physical, chart, labs and discussed the procedure including the risks, benefits and alternatives for the proposed anesthesia with the patient or authorized representative who has indicated his/her understanding and acceptance.     Dental advisory given  Plan Discussed with: CRNA  Anesthesia Plan Comments:       Anesthesia Quick Evaluation

## 2019-10-15 NOTE — Progress Notes (Addendum)
Progress Note  Patient Name: Maurice Nguyen Date of Encounter: 10/15/2019  Primary Cardiologist: Mertie Moores, MD   Subjective   Sitting up in bed. Breathing is stable.   Inpatient Medications    Scheduled Meds: . aspirin EC  81 mg Oral Daily  . atorvastatin  80 mg Oral q1800  . carvedilol  6.25 mg Oral BID WC  . Chlorhexidine Gluconate Cloth  6 each Topical Once  . furosemide  80 mg Intravenous BID  . insulin aspart  0-20 Units Subcutaneous TID WC  . insulin aspart  0-5 Units Subcutaneous QHS  . insulin aspart  4 Units Subcutaneous TID WC  . insulin NPH Human  40 Units Subcutaneous BID AC & HS  . losartan  25 mg Oral Daily  . multivitamin with minerals  1 tablet Oral Daily  . Ensure Max Protein  11 oz Oral BID  . saccharomyces boulardii  250 mg Oral BID  . sodium chloride flush  10-40 mL Intracatheter Q12H  . sodium chloride flush  3 mL Intravenous Q12H   Continuous Infusions: . sodium chloride    . ampicillin-sulbactam (UNASYN) IV 3 g (10/15/19 0657)  . heparin 3,000 Units/hr (10/15/19 7867)  . lactated ringers 10 mL/hr at 10/10/19 1138   PRN Meds: sodium chloride, menthol-cetylpyridinium, ondansetron (ZOFRAN) IV, oxyCODONE-acetaminophen, polyethylene glycol, senna-docusate, sodium chloride flush, sodium chloride flush   Vital Signs    Vitals:   10/15/19 0500 10/15/19 0600 10/15/19 0700 10/15/19 0726  BP:    122/89  Pulse: 100  (!) 43 84  Resp: (!) 21  13 17   Temp:    98.5 F (36.9 C)  TempSrc:    Oral  SpO2: 92%  100% 98%  Weight:  (!) 186.2 kg    Height:        Intake/Output Summary (Last 24 hours) at 10/15/2019 1026 Last data filed at 10/15/2019 0836 Gross per 24 hour  Intake 790 ml  Output 3376 ml  Net -2586 ml   Last 3 Weights 10/15/2019 10/14/2019 10/13/2019  Weight (lbs) 410 lb 7.9 oz 419 lb 8.6 oz 426 lb 9.4 oz  Weight (kg) 186.2 kg 190.3 kg 193.5 kg      Telemetry    Afib rate - Personally Reviewed  ECG    No new tracing this  morning.  Physical Exam  AAM, sitting up in bed.  GEN: No acute distress.   Neck: No JVD Cardiac: Irreg Irreg, no murmurs, rubs, or gallops.  Respiratory: Clear to auscultation bilaterally. GI: Soft, nontender, non-distended.  MS: No edema; No deformity. LLE wrapped.  Neuro:  Nonfocal  Psych: Normal affect   Labs    High Sensitivity Troponin:  No results for input(s): TROPONINIHS in the last 720 hours.    Chemistry Recent Labs  Lab 10/08/19 1520 10/09/19 0312 10/13/19 0640 10/14/19 0406 10/15/19 0534  NA 135   < > 136 137 139  K 4.3   < > 4.1 4.3 3.7  CL 98   < > 102 99 100  CO2 26   < > 25 28 27   GLUCOSE 127*   < > 268* 157* 160*  BUN 21*   < > 25* 24* 28*  CREATININE 1.45*   < > 1.16 1.07 1.16  CALCIUM 8.6*   < > 8.2* 8.5* 8.8*  PROT 7.2  --   --   --   --   ALBUMIN 2.8*   < > 2.6* 2.6* 2.7*  AST 24  --   --   --   --  ALT 27  --   --   --   --   ALKPHOS 76  --   --   --   --   BILITOT 1.1  --   --   --   --   GFRNONAA 54*   < > >60 >60 >60  GFRAA >60   < > >60 >60 >60  ANIONGAP 11   < > 9 10 12    < > = values in this interval not displayed.     Hematology Recent Labs  Lab 10/13/19 0640 10/14/19 0406 10/15/19 0534  WBC 10.0 9.7 10.2  RBC 3.98* 4.19* 4.32  HGB 12.2* 12.9* 13.2  HCT 39.1 40.9 41.5  MCV 98.2 97.6 96.1  MCH 30.7 30.8 30.6  MCHC 31.2 31.5 31.8  RDW 13.3 13.5 13.5  PLT 309 326 352    BNP Recent Labs  Lab 10/09/19 1755  BNP 320.8*     DDimer No results for input(s): DDIMER in the last 168 hours.   Radiology    No results found.  Cardiac Studies   Echo 10/09/19 1. Left ventricular ejection fraction, by estimation, is 30 to 35%. The  left ventricle has moderately decreased function. The left ventricle  demonstrates global hypokinesis. The left ventricular internal cavity size  was mildly dilated. Left ventricular  diastolic parameters are indeterminate.  2. Right ventricular systolic function was not well visualized. The  right  ventricular size is not well visualized.  3. Left atrial size was moderately dilated.  4. The mitral valve is abnormal. Trivial mitral valve regurgitation.  5. The aortic valve is tricuspid. Aortic valve regurgitation is not  visualized.  6. Aortic dilatation noted. There is mild dilatation of the ascending  aorta measuring 39 mm.  7. The inferior vena cava is dilated in size with <50% respiratory  variability, suggesting right atrial pressure of 15 mmHg.   Patient Profile     54 y.o. 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 2017.  Assessment & Plan    1. Acute on chronic combined heart failure: -Patient self discontinued diuretic 2 weeks ago and presented with worsening LLEand SOB>>>improved with IV Lasix  -Remains on IV Lasix 80 BID for now -Weight, 410 lb today, down from 430lb on admission  -I&O, net negative 18.3L -Echo this admission showed EF 30-35%. Echo in 2016 showed EF 30-35%and it appears patient never had ischemic evaluation at that time. -On ARB>>>will attempt Entresto transition, May also consider Bidil and spiro at a later time -continue on coreg 6.25mg  BID  2. Cardiomyopathy: -EF 30-35% per echocardiogram this admission -Coronaries assessed during vascular procedure 10/11/2019 found to have moderate to severe stenosis and a small PDA branch of the RCA not felt to be contributing to cardiomyopathy  -Continue medical management of CAD and cardiomyopathy as above  -on ASA, BB, statin   3. Nonhealing Diabetic left foot wound: -Podiatry with debridement with Dr. 10/13/2019. There was concern for stenosis on recent duplex therefore he underwent angiography with no intervention -On abx per IM  4. Paroxysmal Afib, now Afib RVR: -On admission  noted to be in Afib RVR and started on IVcardizem and IV heparin -CHADSVASC= 4(HTN, CHF, DM, PVD) -Will continue on BB, plan for rate control -Given podiatry and possible vascular procedure would continue with IV  heparin and later transition to DOAC. Complex situation with his BMI and DOAC, but suspect he would not be a good candidate for coumadin with hx of noncompliance.   5. HTN: -Stable  6. NSVT: - BB as above  7. DM2: -SSI per IM  8. HLD: -LDL, 65 -Transition to atorvastatin high intensity given moderate CAD   9. AKI -Creatinine, 1.16 today improving with diuresis -Watch with daily BMET   For questions or updates, please contact [facility] Please consult [email or web address] for contact info under        Signed, Laverda Page, NP  10/15/2019, 10:26 AM    I have personally seen and examined this patient. I agree with the assessment and plan as outlined above.  He has diuresed well but still volume overloaded. Will continue IV Lasix. Continue beta blocker. Consider changing ARB to [facility]. Will wait and transition from IV heparin to a DOAC when he is done with all of his wound procedures.   Maurice Nguyen 10/15/2019 1:51 PM

## 2019-10-15 NOTE — Anesthesia Procedure Notes (Signed)
Procedure Name: MAC Date/Time: 10/15/2019 1:00 PM Performed by: Orlie Dakin, CRNA Pre-anesthesia Checklist: Patient identified, Emergency Drugs available, Suction available and Patient being monitored Patient Re-evaluated:Patient Re-evaluated prior to induction Oxygen Delivery Method: Simple face mask Preoxygenation: Pre-oxygenation with 100% oxygen Induction Type: IV induction Placement Confirmation: positive ETCO2

## 2019-10-15 NOTE — Plan of Care (Signed)
  Problem: Education: Goal: Knowledge of General Education information will improve Description: Including pain rating scale, medication(s)/side effects and non-pharmacologic comfort measures Outcome: Progressing   Problem: Health Behavior/Discharge Planning: Goal: Ability to manage health-related needs will improve Outcome: Progressing   Problem: Clinical Measurements: Goal: Ability to maintain clinical measurements within normal limits will improve Outcome: Progressing Goal: Will remain free from infection Outcome: Progressing Goal: Diagnostic test results will improve Outcome: Progressing Goal: Respiratory complications will improve Outcome: Progressing Goal: Cardiovascular complication will be avoided Outcome: Progressing   Problem: Activity: Goal: Risk for activity intolerance will decrease Outcome: Progressing   Problem: Coping: Goal: Level of anxiety will decrease Outcome: Progressing   Problem: Pain Managment: Goal: General experience of comfort will improve Outcome: Progressing   Problem: Education: Goal: Ability to demonstrate management of disease process will improve Outcome: Progressing Goal: Ability to verbalize understanding of medication therapies will improve Outcome: Progressing   Problem: Activity: Goal: Capacity to carry out activities will improve Outcome: Progressing   Problem: Cardiac: Goal: Ability to achieve and maintain adequate cardiopulmonary perfusion will improve Outcome: Progressing   

## 2019-10-15 NOTE — Brief Op Note (Signed)
10/15/2019  1:24 PM  PATIENT:  Maurice Nguyen  54 y.o. male  PRE-OPERATIVE DIAGNOSIS:  ulceration  POST-OPERATIVE DIAGNOSIS:  ulceration  PROCEDURE:  Procedure(s): DEBRIDEMENT WOUND, POSSIBLE WOUND VAC APPLICATION (Left)  SURGEON:  Surgeon(s) and Role:    * Trula Slade, DPM - Primary  PHYSICIAN ASSISTANT:   ASSISTANTS: none   ANESTHESIA:   MAC  EBL:  Minimal    BLOOD ADMINISTERED:none  DRAINS: none   LOCAL MEDICATIONS USED:  OTHER 20 cc lidocaine and marcaine plain  SPECIMEN:  Source of Specimen:  wound culture   DISPOSITION OF SPECIMEN:  PATHOLOGY  COUNTS:  YES  TOURNIQUET:  * No tourniquets in log *  DICTATION: .Dragon Dictation  PLAN OF CARE: Admit to inpatient   PATIENT DISPOSITION:  PACU - hemodynamically stable.   Delay start of Pharmacological VTE agent (>24hrs) due to surgical blood loss or risk of bleeding: no  Intraoperative findings: Long the distal wound there was a pocket of purulence that was drainage. Debrided the tissue and the tissue to both wounds appears to be viable and bleeding after debridement. Grafted heel ulcer with Acell. Will change the bandage Wednesday. Monitor WBC and fever curve. Likely discharge to rehab.

## 2019-10-15 NOTE — Transfer of Care (Signed)
Immediate Anesthesia Transfer of Care Note  Patient: Maurice Nguyen  Procedure(s) Performed: DEBRIDEMENT WOUND, POSSIBLE WOUND VAC APPLICATION (Left Foot)  Patient Location: PACU  Anesthesia Type:MAC  Level of Consciousness: awake, oriented and patient cooperative  Airway & Oxygen Therapy: Patient Spontanous Breathing and Patient connected to face mask oxygen  Post-op Assessment: Report given to RN and Post -op Vital signs reviewed and stable  Post vital signs: Reviewed and stable  Last Vitals:  Vitals Value Taken Time  BP    Temp    Pulse    Resp    SpO2      Last Pain:  Vitals:   10/15/19 0748  TempSrc:   PainSc: 2       Patients Stated Pain Goal: 0 (54/49/20 1007)  Complications: No apparent anesthesia complications

## 2019-10-15 NOTE — Procedures (Signed)
Patient states that he has not worn CPAP at home in years.  He declined to use CPAP at this time.

## 2019-10-16 ENCOUNTER — Encounter: Payer: Self-pay | Admitting: *Deleted

## 2019-10-16 DIAGNOSIS — E871 Hypo-osmolality and hyponatremia: Secondary | ICD-10-CM

## 2019-10-16 LAB — GLUCOSE, CAPILLARY
Glucose-Capillary: 240 mg/dL — ABNORMAL HIGH (ref 70–99)
Glucose-Capillary: 238 mg/dL — ABNORMAL HIGH (ref 70–99)
Glucose-Capillary: 174 mg/dL — ABNORMAL HIGH (ref 70–99)
Glucose-Capillary: 186 mg/dL — ABNORMAL HIGH (ref 70–99)

## 2019-10-16 LAB — BASIC METABOLIC PANEL
Anion gap: 11 (ref 5–15)
BUN: 30 mg/dL — ABNORMAL HIGH (ref 6–20)
CO2: 24 mmol/L (ref 22–32)
Calcium: 8.7 mg/dL — ABNORMAL LOW (ref 8.9–10.3)
Chloride: 99 mmol/L (ref 98–111)
Creatinine, Ser: 1.29 mg/dL — ABNORMAL HIGH (ref 0.61–1.24)
GFR calc Af Amer: >60 mL/min (ref >60)
GFR calc non Af Amer: >60 mL/min (ref >60)
Glucose, Bld: 253 mg/dL — ABNORMAL HIGH (ref 70–99)
Potassium: 4.1 mmol/L (ref 3.5–5.1)
Sodium: 134 mmol/L — ABNORMAL LOW (ref 135–145)

## 2019-10-16 LAB — CBC
HCT: 40.8 % (ref 39.0–52.0)
Hemoglobin: 13.2 g/dL (ref 13.0–17.0)
MCH: 31.1 pg (ref 26.0–34.0)
MCHC: 32.4 g/dL (ref 30.0–36.0)
MCV: 96 fL (ref 80.0–100.0)
Platelets: 345 10*3/uL (ref 150–400)
RBC: 4.25 MIL/uL (ref 4.22–5.81)
RDW: 13.4 % (ref 11.5–15.5)
WBC: 12.4 10*3/uL — ABNORMAL HIGH (ref 4.0–10.5)
nRBC: 0.2 % (ref 0.0–0.2)

## 2019-10-16 LAB — HEPARIN LEVEL (UNFRACTIONATED): Heparin Unfractionated: 0.57 IU/mL (ref 0.30–0.70)

## 2019-10-16 LAB — MAGNESIUM: Magnesium: 1.9 mg/dL (ref 1.7–2.4)

## 2019-10-16 MED ORDER — VITAMIN D (ERGOCALCIFEROL) 1.25 MG (50000 UNIT) PO CAPS
50000.0000 [IU] | ORAL_CAPSULE | ORAL | Status: DC
  Administered 2019-10-16: 50000 [IU] via ORAL
  Filled 2019-10-16 (×2): qty 1

## 2019-10-16 MED ORDER — INSULIN ASPART 100 UNIT/ML ~~LOC~~ SOLN: 6.0000 [IU] | Freq: Three times a day (TID) | SUBCUTANEOUS | Status: DC

## 2019-10-16 MED ORDER — INSULIN ASPART 100 UNIT/ML ~~LOC~~ SOLN
0.0000 [IU] | Freq: Three times a day (TID) | SUBCUTANEOUS | Status: DC
  Administered 2019-10-16 (×2): 7 [IU] via SUBCUTANEOUS
  Administered 2019-10-16: 4 [IU] via SUBCUTANEOUS
  Administered 2019-10-17: 3 [IU] via SUBCUTANEOUS
  Administered 2019-10-17 – 2019-10-19 (×3): 4 [IU] via SUBCUTANEOUS

## 2019-10-16 MED ORDER — INSULIN NPH (HUMAN) (ISOPHANE) 100 UNIT/ML ~~LOC~~ SUSP: 42.0000 [IU] | Freq: Two times a day (BID) | SUBCUTANEOUS | Status: DC

## 2019-10-16 MED ORDER — INSULIN NPH (HUMAN) (ISOPHANE) 100 UNIT/ML ~~LOC~~ SUSP
42.0000 [IU] | Freq: Two times a day (BID) | SUBCUTANEOUS | Status: DC
  Administered 2019-10-16 – 2019-10-19 (×7): 42 [IU] via SUBCUTANEOUS

## 2019-10-16 MED ORDER — INSULIN ASPART 100 UNIT/ML ~~LOC~~ SOLN
6.0000 [IU] | Freq: Three times a day (TID) | SUBCUTANEOUS | Status: DC
  Administered 2019-10-16 – 2019-10-19 (×10): 6 [IU] via SUBCUTANEOUS

## 2019-10-16 MED ORDER — SPIRONOLACTONE 25 MG PO TABS
25.0000 mg | ORAL_TABLET | Freq: Every day | ORAL | Status: DC
  Administered 2019-10-16 – 2019-10-19 (×4): 25 mg via ORAL
  Filled 2019-10-16 (×4): qty 1

## 2019-10-16 MED ORDER — RIVAROXABAN 20 MG PO TABS
20.0000 mg | ORAL_TABLET | Freq: Every day | ORAL | Status: DC
  Administered 2019-10-16 – 2019-10-18 (×3): 20 mg via ORAL
  Filled 2019-10-16 (×3): qty 1

## 2019-10-16 NOTE — Progress Notes (Signed)
Pt c/o burning to right groin site. Continues to be unchanged with no redness or swelling. BLE pulses intact with cap refill less than 3 seconds. MD aware of pain and assessed during day.

## 2019-10-16 NOTE — Anesthesia Postprocedure Evaluation (Signed)
Anesthesia Post Note  Patient: Maurice Nguyen  Procedure(s) Performed: LOWER EXTREMITY ANGIOGRAM (Bilateral ) left heart cath and coronary angiography (Left )     Patient location during evaluation: PACU Anesthesia Type: General Level of consciousness: awake and patient cooperative Pain management: pain level controlled Vital Signs Assessment: post-procedure vital signs reviewed and stable Respiratory status: spontaneous breathing, nonlabored ventilation, respiratory function stable and patient connected to nasal cannula oxygen Cardiovascular status: blood pressure returned to baseline and stable Postop Assessment: no apparent nausea or vomiting Anesthetic complications: no    Last Vitals:  Vitals:   10/16/19 1200 10/16/19 1701  BP: 99/83 122/88  Pulse: 92 85  Resp: 20 16  Temp:    SpO2: 99% 97%    Last Pain:  Vitals:   10/16/19 1218  TempSrc:   PainSc: 10-Worst pain ever   Pain Goal: Patients Stated Pain Goal: 0 (10/16/19 0234)                 Will Heinkel

## 2019-10-16 NOTE — Plan of Care (Signed)
  Problem: Education: Goal: Knowledge of General Education information will improve Description: Including pain rating scale, medication(s)/side effects and non-pharmacologic comfort measures Outcome: Progressing   Problem: Health Behavior/Discharge Planning: Goal: Ability to manage health-related needs will improve Outcome: Progressing   Problem: Clinical Measurements: Goal: Ability to maintain clinical measurements within normal limits will improve Outcome: Progressing Goal: Will remain free from infection Outcome: Progressing Goal: Diagnostic test results will improve Outcome: Progressing Goal: Respiratory complications will improve Outcome: Progressing Goal: Cardiovascular complication will be avoided Outcome: Progressing   Problem: Activity: Goal: Risk for activity intolerance will decrease Outcome: Progressing   Problem: Coping: Goal: Level of anxiety will decrease Outcome: Progressing   Problem: Pain Managment: Goal: General experience of comfort will improve Outcome: Progressing   Problem: Education: Goal: Ability to demonstrate management of disease process will improve Outcome: Progressing Goal: Ability to verbalize understanding of medication therapies will improve Outcome: Progressing   Problem: Activity: Goal: Capacity to carry out activities will improve Outcome: Progressing   Problem: Cardiac: Goal: Ability to achieve and maintain adequate cardiopulmonary perfusion will improve Outcome: Progressing

## 2019-10-16 NOTE — Evaluation (Signed)
Occupational Therapy Evaluation Patient Details Name: Maurice Nguyen MRN: [id number] DOB: 1965-08-27 Today's Date: 10/16/2019    History of Present Illness Patient is a 54 y/o male who presents with worsening left foot infection and worsening BLE edema. Also found to be in A-fib with RVR. s/p debridement of left diabetic foot ulcer by podiatry on 3/17. s/p LLE angiogram and LHC on 3/18.  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 s/p wound debridement 3/22. PMH includes HTN, DM, morbid obesity, chronic back pain, combined systolic and diastolic CHF, A-fib, CKD.   Clinical Impression   Patient is a 54 year old male that lives alone in a single level home with 3 steps to enter. Patient has long term partner [name] that comes over daily, prior to ~3 weeks ago when L LE issues began patient was independent with self care. For past three weeks patient's mobility has been significantly limited mostly remaining in bed and performing sponge bathes. Instructed patient to treat L LE as NWB for now until clarification received from MD re: potential for darco shoe, ect. Patient perform bed mobility modified I with use of head rails and HOB elevated, min guard for sit to stand from elevated surface and hop x2 with rolling walker. With prolonged standing patient has increased difficulty maintaining NWB status with minimal weight placed through L LE. Currently recommending HH pending clarification on WB status for L LE, will continue to follow to maximize patient safety/independence with self care.    Follow Up Recommendations  Home health OT;Other (comment);Supervision/Assistance - 24 hour(pending WB status L LE)    Equipment Recommendations  3 in 1 bedside commode       Precautions / Restrictions Precautions Precautions: Fall Precaution Comments: wound left foot Restrictions Weight Bearing Restrictions: No      Mobility Bed Mobility Overal bed  mobility: Needs Assistance Bed Mobility: Supine to Sit;Sit to Supine     Supine to sit: Modified independent (Device/Increase time);HOB elevated Sit to supine: Modified independent (Device/Increase time);HOB elevated   General bed mobility comments: Heavy use of rails to get to EOB and return to supine. Increased time with HOB elevated.  Transfers Overall transfer level: Needs assistance Equipment used: Rolling walker (2 wheeled) Transfers: Sit to/from Stand Sit to Stand: Min guard         General transfer comment: Min guard for safety and blocking walker wheels per pt request; stood from EOB x1. Placing minimal weight through left foot. Heavy WB through BUEs.    Balance Overall balance assessment: Needs assistance Sitting-balance support: Feet supported;No upper extremity supported Sitting balance-Leahy Scale: Fair Sitting balance - Comments: supervision for safety   Standing balance support: During functional activity;Bilateral upper extremity supported Standing balance-Leahy Scale: Poor Standing balance comment: Requires Ue support, difficulty maintaining NWB LLE for long periods                           ADL either performed or assessed with clinical judgement   ADL Overall ADL's : Needs assistance/impaired Eating/Feeding: Independent   Grooming: Set up;Sitting   Upper Body Bathing: Set up;Sitting   Lower Body Bathing: Moderate assistance;Maximal assistance;Sitting/lateral leans;Sit to/from stand   Upper Body Dressing : Set up;Sitting   Lower Body Dressing: Maximal assistance;Sitting/lateral leans;Sit to/from stand   Toilet Transfer: Min Probation officer Details (indicate cue type and reason): simulated with functional mobility, patient able to take small hops and has been stand  pivot transferring with nursing to get to commode  Toileting- Clothing Manipulation and Hygiene: Moderate assistance;Sitting/lateral lean;Sit to/from  stand;Minimal assistance       Functional mobility during ADLs: Min guard;Rolling walker;Cueing for safety                    Pertinent Vitals/Pain Pain Assessment: Faces Faces Pain Scale: Hurts even more Pain Location: lower right abdomen with coughing/movement Pain Descriptors / Indicators: Grimacing;Guarding;Sore;Discomfort Pain Intervention(s): Monitored during session     Hand Dominance Right   Extremity/Trunk Assessment Upper Extremity Assessment Upper Extremity Assessment: Overall WFL for tasks assessed   Lower Extremity Assessment Lower Extremity Assessment: Defer to PT evaluation    Cervical / Trunk Assessment Cervical / Trunk Assessment: Normal   Communication Communication Communication: No difficulties   Cognition Arousal/Alertness: Awake/alert Behavior During Therapy: WFL for tasks assessed/performed Overall Cognitive Status: Within Functional Limits for tasks assessed                                 General Comments: poor understanding of medical conditions   General Comments  Partner, Juliann Pulse, present during session. Pt wanted her to stay.            Home Living Family/patient expects to be discharged to:: Private residence Living Arrangements: Alone Available Help at Discharge: Friend(s);Other (Comment)(has partner Juliann Pulse of 30 years that comes over daily) Type of Home: House Home Access: Stairs to enter [employer] of Steps: 3 Entrance Stairs-Rails: None Home Layout: One level     Bathroom Shower/Tub: Teacher, early years/pre: Standard     Home Equipment: None          Prior Functioning/Environment Level of Independence: Needs assistance  Gait / Transfers Assistance Needed: Has not been walking for 3 weeks due to foot pain. ADL's / Homemaking Assistance Needed: some assist needed for bathing.   Comments: Sleeps in a recliner at home        OT Problem List: Decreased activity  tolerance;Impaired balance (sitting and/or standing);Decreased safety awareness;Obesity      OT Treatment/Interventions: Self-care/ADL training;Therapeutic exercise;Energy conservation;DME and/or AE instruction;Therapeutic activities;Patient/family education;Balance training    OT Goals(Current goals can be found in the care plan section) Acute Rehab OT Goals Patient Stated Goal: to return to independence OT Goal Formulation: With patient Time For Goal Achievement: 10/30/19 Potential to Achieve Goals: Good  OT Frequency: Min 2X/week           Co-evaluation PT/OT/SLP Co-Evaluation/Treatment: Yes Reason for Co-Treatment: Complexity of the patient's impairments (multi-system involvement);For patient/therapist safety;To address functional/ADL transfers   OT goals addressed during session: ADL's and self-care      AM-PAC OT "6 Clicks" Daily Activity     Outcome Measure Help from another person eating meals?: None Help from another person taking care of personal grooming?: A Little Help from another person toileting, which includes using toliet, bedpan, or urinal?: A Little Help from another person bathing (including washing, rinsing, drying)?: A Lot Help from another person to put on and taking off regular upper body clothing?: A Little Help from another person to put on and taking off regular lower body clothing?: A Lot 6 Click Score: 17   End of Session Equipment Utilized During Treatment: Rolling walker Nurse Communication: Mobility status;Weight bearing status;Other (comment)(ask RN to speak with MD regarding WB status)  Activity Tolerance: Patient tolerated treatment well Patient left: in bed;with call bell/phone within  reach;with family/visitor present  OT Visit Diagnosis: Other abnormalities of gait and mobility (R26.89)                Time: 8110-3159 OT Time Calculation (min): 46 min Charges:  OT General Charges $OT Visit: 1 Visit OT Evaluation $OT Eval Moderate  Complexity: 1 Mod  Myrtie Neither OT OT office: [phone]  Carmelia Roller 10/16/2019, 1:16 PM

## 2019-10-16 NOTE — Progress Notes (Signed)
Patient refuses CPAP will not wear in hospital. Provider needs to discontinue the CPAP.

## 2019-10-16 NOTE — TOC Progression Note (Addendum)
Transition of Care [facility]) - Progression Note    Patient Details  Name: Maurice Nguyen MRN: [id number] Date of Birth: [birth year]  Transition of Care [facility]) CM/SW Contact  Maurice Haven, RN Phone Number: 10/16/2019, 5:02 PM  Clinical Narrative:    NCM spoke with patient, he was asking about a lift chair.  NCM informed him that he would have to pay for a lift chair from a medical supply store him self.  NCM informed him the recommendations from pt and ot is BSC, walker, HHPT and HHOT, He will need HHRN also.  Patient states he would like to have a cane  And crutches.  NCM called ortho tech to bring crutches to room.  NCM offered choice for [facility] services , he states he does not have a preference.  NCM made referral to Maurice Nguyen with [facility], awaiting call back.  Patient 's sister will transport him home at discharge.  He has insurance for his medications.    3/24- NCM spoke with patient, he states he spoke with Dr. Ardelle Nguyen this am and he would like for him to get some therapy for a week before he goes home.  Sounds like inpatient therapy with SNF. NCM confirmed with Dr. Ardelle Nguyen, he says patient can go home with [facility], and HHPT, MD will see him weekly also.  MD came to room and patient can not put WB on foot, no help to change dressing at home.  MD states he will need a SNF to help with wound care so he will not loose his limb.  Patient states he does not have a preference of SNF and that we can fax him out to see what offers he gets.          Expected Discharge Plan and Services                                                 Social Determinants of Health (SDOH) Interventions    Readmission Risk Interventions No flowsheet data found.

## 2019-10-16 NOTE — Progress Notes (Addendum)
ANTICOAGULATION CONSULT NOTE   Pharmacy Consult for heparin>> xarelto Indication: atrial fibrillation  No Known Allergies  Patient Measurements: Height: 6\' 2"  (188 cm) Weight: (!) 414 lb 11 oz (188.1 kg) IBW/kg (Calculated) : 82.2 Heparin Dosing Weight: 130kg  Vital Signs: Temp: 98.4 F (36.9 C) (03/23 1108) Temp Source: Oral (03/23 1108) BP: 136/79 (03/23 1108) Pulse Rate: 88 (03/23 1108)  Labs: Recent Labs    10/14/19 0406 10/14/19 0406 10/15/19 0534 10/15/19 2235 10/16/19 0407  HGB 12.9*   < > 13.2  --  13.2  HCT 40.9  --  41.5  --  40.8  PLT 326  --  352  --  345  HEPARINUNFRC 0.65   < > 0.79* 0.50 0.57  CREATININE 1.07  --  1.16  --  1.29*   < > = values in this interval not displayed.    Estimated Creatinine Clearance: 114 mL/min (A) (by C-G formula based on SCr of 1.29 mg/dL (H)).  Assessment: 34 yoF with hx AFib admitted with RVR. Pt previously on Xarelto but has not been taking recently. Pt started on IV heparin with procedures planned now s/p I&D. Plans to restart Xarelto when procedures have been completed -heparin level at goal -hg= 13.3  He is also on Unasyn D5 (Day 8 total antibiotic; was on Zosyn 3/16>>3/19) for diabetic foot ulcer now s/p I&D on 3/17 and 3/22 -WBC= 12.4, CrCl > 100 -wound cultures show proteus mirabilis and bacteriodes on 3/17 and proteus on 3/23 (3/17- sensitive to unasyn; 3/22- cultures reincubated)   Goal of Therapy:  Heparin level 0.3-0.7 units/ml Monitor platelets by anticoagulation protocol: Yes   Plan:  Continue heparin at 300 units/hr Daily heparin level and CBC Continue Unasyn 3gm IV q6h (no changes in antibiotics suggested)   4/22, PharmD Clinical Pharmacist **Pharmacist phone directory can now be found on [email or web address] ([email or web address]).  Listed under [facility].   Addendum -Pharmacy to restart xarelto today   Plan -Stop heparin at 5pm today and begin Xarelto 20mg  daily with dinner  [facility],  PharmD Clinical Pharmacist **Pharmacist phone directory can now be found on [email or web address] ([email or web address]).  Listed under [facility].

## 2019-10-16 NOTE — Progress Notes (Signed)
Progress Note  Patient Name: Maurice Nguyen Date of Encounter: 10/16/2019  Primary Cardiologist: Mertie Moores, MD   Subjective   Breathing a little better.  Complaining of pain in his right lower quadrant.  Notes that his edema in his legs is much better.  Inpatient Medications    Scheduled Meds: . aspirin EC  81 mg Oral Daily  . atorvastatin  80 mg Oral q1800  . carvedilol  6.25 mg Oral BID WC  . furosemide  80 mg Intravenous BID  . insulin aspart  0-20 Units Subcutaneous TID WC  . insulin aspart  0-5 Units Subcutaneous QHS  . insulin aspart  6 Units Subcutaneous TID WC  . insulin NPH Human  42 Units Subcutaneous BID AC & HS  . multivitamin with minerals  1 tablet Oral Daily  . Ensure Max Protein  11 oz Oral BID  . saccharomyces boulardii  250 mg Oral BID  . sacubitril-valsartan  1 tablet Oral BID  . sodium chloride flush  10-40 mL Intracatheter Q12H  . sodium chloride flush  3 mL Intravenous Q12H  . Vitamin D (Ergocalciferol)  50,000 Units Oral Q7 days   Continuous Infusions: . sodium chloride    . ampicillin-sulbactam (UNASYN) IV 3 g (10/16/19 0404)  . heparin 3,000 Units/hr (10/16/19 0043)  . lactated ringers 10 mL/hr at 10/15/19 1125   PRN Meds: sodium chloride, benzonatate, menthol-cetylpyridinium, ondansetron (ZOFRAN) IV, oxyCODONE-acetaminophen, polyethylene glycol, senna-docusate, sodium chloride flush, sodium chloride flush   Vital Signs    Vitals:   10/15/19 1620 10/15/19 1900 10/15/19 1925 10/16/19 0502  BP: (!) 141/91  (!) 121/98 117/86  Pulse: 100  (!) 36 93  Resp: (!) 21 20 20 18   Temp: 98.6 F (37 C)  98.6 F (37 C) 98.3 F (36.8 C)  TempSrc: Oral     SpO2: 93% 99% 98% 97%  Weight:    (!) 188.1 kg  Height:        Intake/Output Summary (Last 24 hours) at 10/16/2019 0814 Last data filed at 10/16/2019 0400 Gross per 24 hour  Intake 2557.49 ml  Output 2945 ml  Net -387.51 ml   Last 3 Weights 10/16/2019 10/15/2019 10/14/2019  Weight (lbs)  414 lb 11 oz 410 lb 7.9 oz 419 lb 8.6 oz  Weight (kg) 188.1 kg 186.2 kg 190.3 kg      Telemetry    Atrial fibrillation heart rate 80 to 90s - Personally Reviewed   Physical Exam  Morbidly obese male in no distress GEN: No acute distress.   Neck: No JVD Cardiac:  Irregularly irregular, no murmurs, rubs, or gallops.  Respiratory: Clear to auscultation bilaterally. GI: Soft, non-distended.  Mild tenderness right lower quadrant just above the groin area, no groin hematoma or ecchymosis noted.  No rebound or guarding. MS:  1+ lower extremity edema, left foot/ankle wrapped Neuro:  Nonfocal  Psych: Normal affect   Labs    High Sensitivity Troponin:  No results for input(s): TROPONINIHS in the last 720 hours.    Chemistry Recent Labs  Lab 10/13/19 0640 10/13/19 0640 10/14/19 0406 10/15/19 0534 10/16/19 0407  NA 136   < > 137 139 134*  K 4.1   < > 4.3 3.7 4.1  CL 102   < > 99 100 99  CO2 25   < > 28 27 24   GLUCOSE 268*   < > 157* 160* 253*  BUN 25*   < > 24* 28* 30*  CREATININE 1.16   < >  1.07 1.16 1.29*  CALCIUM 8.2*   < > 8.5* 8.8* 8.7*  ALBUMIN 2.6*  --  2.6* 2.7*  --   GFRNONAA >60   < > >60 >60 >60  GFRAA >60   < > >60 >60 >60  ANIONGAP 9   < > 10 12 11    < > = values in this interval not displayed.     Hematology Recent Labs  Lab 10/14/19 0406 10/15/19 0534 10/16/19 0407  WBC 9.7 10.2 12.4*  RBC 4.19* 4.32 4.25  HGB 12.9* 13.2 13.2  HCT 40.9 41.5 40.8  MCV 97.6 96.1 96.0  MCH 30.8 30.6 31.1  MCHC 31.5 31.8 32.4  RDW 13.5 13.5 13.4  PLT 326 352 345    BNP Recent Labs  Lab 10/09/19 1755  BNP 320.8*     DDimer No results for input(s): DDIMER in the last 168 hours.   Radiology    No results found.  Cardiac Studies   Echocardiogram 10/09/2019: IMPRESSIONS    1. Left ventricular ejection fraction, by estimation, is 30 to 35%. The  left ventricle has moderately decreased function. The left ventricle  demonstrates global hypokinesis. The left  ventricular internal cavity size  was mildly dilated. Left ventricular  diastolic parameters are indeterminate.  2. Right ventricular systolic function was not well visualized. The right  ventricular size is not well visualized.  3. Left atrial size was moderately dilated.  4. The mitral valve is abnormal. Trivial mitral valve regurgitation.  5. The aortic valve is tricuspid. Aortic valve regurgitation is not  visualized.  6. Aortic dilatation noted. There is mild dilatation of the ascending  aorta measuring 39 mm.  7. The inferior vena cava is dilated in size with <50% respiratory  variability, suggesting right atrial pressure of 15 mmHg.  Patient Profile     54 y.o. 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 2017.  Assessment & Plan    1.  Acute on chronic combined systolic and diastolic heart failure: The patient is continued with IV furosemide.  I reviewed his weights this morning and his highest weight on admission 10/08/2019 was 430 pounds.  His weight today is 415 pounds.  Yesterday's I/O's were negative about 400 cc.  The patient has been transitioned to [place].  He continues on IV furosemide 80 mg twice daily.  He is also on carvedilol.  We will add spironolactone at low-dose.  Will transition to oral diuretics tomorrow. 2.  Atrial fibrillation, persistent: Rate controlled with beta blockade.  Very low chance of maintaining sinus rhythm in this morbidly obese patient.  Rate control strategy planned.  He is on IV heparin around need for surgical debridement of a diabetic foot wound.  Transition to DOAC when no more procedures planned.  Issues around DOAC with his high body weight and morbid obesity, but he is not a candidate for warfarin because of a long history of  noncompliance. 3.  Nonhealing diabetic foot wound: Patient underwent debridement yesterday.  Appears to be doing okay.  Overall the patient appears to be improving.  We will add spironolactone today.  Continue IV diuretics today and transition to oral furosemide tomorrow.  For questions or updates, please contact [facility] Please consult [email or web address] for contact info under     Signed, [place], MD  10/16/2019,  8:14 AM

## 2019-10-16 NOTE — Progress Notes (Signed)
Nutrition Follow-up  DOCUMENTATION CODES:   Morbid obesity  INTERVENTION:   -Continue double protein portions with meals -Continue MVI with minerals daily -Continue Ensure Max po BID, each supplement provides 150 kcal and 30 grams of protein. -Provided "Low Sodium Nutrition Therapy" handout from the Academy of [employer] Dietetics; attached to AVS/ discharge summary  NUTRITION DIAGNOSIS:   Increased nutrient needs related to wound healing as evidenced by estimated needs.  Ongoing  GOAL:   Patient will meet greater than or equal to 90% of their needs  Progressing   MONITOR:   PO intake, Supplement acceptance, Diet advancement, Labs, Weight trends, Skin, I & O's  REASON FOR ASSESSMENT:   Consult Wound healing  ASSESSMENT:   Maurice Nguyen is a 54 yo AA male with past medical history significant for uncontrolled DM type 2, HTN, chronic systolic HF EF 30-35%, paroxysmal A fib who presents with worsening wound infection on plantar surface of left foot. He has been followed by Dr. Ardelle Anton of Podiatry, was scheduled also to consult with vascular surgery Dr. Darrick Penna on 3/18. He states that due to pain, he has not been taking his diuretic so he would not have to go to the bathroom as often. Now presenting with worsening peripheral edema. CT left foot did not reveal focal fluid collection or abscess, no CT findings of osteo. He was found to be in A Fib RVR, cardizem gtt started and given IV lasix.  3/17- s/p PROCEDURE:  Procedure(s): DEBRIDEMENT WOUND (Left) 3/18- s/p aortogram; s/p Left Heart Catheterization, Selective Coronary Angiography 3/22- s/p debridement  Reviewed I/O's: -388 ml x 24 hours and -19 L since admission  UOP: 2.9 L x 24 hours  Pt receiving nursing care at time of visit.   Pt with good appetite, consuming 75-100%. Pt is taking Ensure supplements per MAR.   Obesity is a complex, chronic medical condition that is optimally managed by a  multidisciplinary care team. Weight loss is not an ideal goal for an acute inpatient hospitalization. However, if further work-up for obesity is warranted, consider outpatient referral to outpatient bariatric service and/or [facility].   Labs reviewed: Na: 134, CBGS: 144-322 (inpatient orders for glycemic control are 0-20 units insulin aspart TID with meals, 0-5 units insulin aspart q HS,6 units insulin aspart TID with meals, and 42 units insulin NPH BID).   Diet Order:   Diet Order            Diet heart healthy/carb modified Room service appropriate? Yes; Fluid consistency: Thin  Diet effective now              EDUCATION NEEDS:   No education needs have been identified at this time  Skin:  Skin Assessment: Skin Integrity Issues: Skin Integrity Issues:: Other (Comment) Other: necrotic wound to lt plantar foot  Last BM:  10/14/19  Height:   Ht Readings from Last 1 Encounters:  10/11/19 6\' 2"  (1.88 m)    Weight:   Wt Readings from Last 1 Encounters:  10/16/19 (!) 188.1 kg    Ideal Body Weight:  86.4 kg  BMI:  Body mass index is 53.24 kg/m.  Estimated Nutritional Needs:   Kcal:  2400-2600  Protein:  135-150 grams  Fluid:  > 2.4 L    10/18/19, RD, LDN, CDCES Registered Dietitian II Certified Diabetes Care and Education Specialist Please refer to [facility] for RD and/or RD on-call/weekend/after hours pager

## 2019-10-16 NOTE — Progress Notes (Signed)
MD at the bedside to complete dressing change at this time.

## 2019-10-16 NOTE — Evaluation (Signed)
Physical Therapy Evaluation Patient Details Name: Maurice Nguyen MRN: [id number] DOB: 1966-02-12 Today's Date: 10/16/2019   History of Present Illness  Patient is a 54 y/o male who presents with worsening left foot infection and worsening BLE edema. Also found to be in A-fib with RVR. s/p debridement of left diabetic foot ulcer by podiatry on 3/17. s/p LLE angiogram and LHC on 3/18.  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 s/p wound debridement 3/22. PMH includes HTN, DM, morbid obesity, chronic back pain, combined systolic and diastolic CHF, A-fib, CKD.  Clinical Impression  Patient presents with abdominal pain, generalized weakness, left foot wound, impaired balance and impaired mobility s/p above. Pt lives alone and reports not being able to walk for 3 weeks due to left foot pain. Prior to 3 weeks ago, pt independent for ADLs/walking. Has a partner, Maurice Nguyen, who can assist some at d/c. Today, pt tolerated bed mobility (heavy use of rails), standing (from elevated bed height) and hopping x2 with use of RW and close min guard for safety placing minimal weight through LLE. Mobility limited as treating LLE as NWB due to wound infection. Will need to clarify WB status of LLE to better determine d/c plan. If pt not able to place any weight through LLE, may require post acute rehab pending progress as pt lives alone and has to negotiate steps to get into house. Will follow acutely to maximize independence and mobility and better determine disposition pending WB status of LLE.     Follow Up Recommendations Home health PT;Supervision for mobility/OOB;Supervision/Assistance - 24 hour(pending ability to place weight through LLE)    Equipment Recommendations  (bariatric RW)    Recommendations for Other Services       Precautions / Restrictions Precautions Precautions: Fall Precaution Comments: wound left foot Restrictions Weight Bearing  Restrictions: No      Mobility  Bed Mobility Overal bed mobility: Needs Assistance Bed Mobility: Supine to Sit;Sit to Supine     Supine to sit: Modified independent (Device/Increase time);HOB elevated Sit to supine: Modified independent (Device/Increase time);HOB elevated   General bed mobility comments: Heavy use of rails to get to EOB and return to supine. Increased time with HOB elevated.  Transfers Overall transfer level: Needs assistance Equipment used: Rolling walker (2 wheeled) Transfers: Sit to/from Stand Sit to Stand: Min guard         General transfer comment: Min guard for safety and blocking walker wheels per pt request; stood from EOB x1. Placing minimal weight through left foot. Heavy WB through BUEs.  Ambulation/Gait Ambulation/Gait assistance: Min guard;+2 safety/equipment Gait Distance (Feet): 2 Feet Assistive device: Rolling walker (2 wheeled) Gait Pattern/deviations: Wide base of support;Trunk flexed;Shuffle Gait velocity: decreased   General Gait Details: Able to perform "hop to" gait x2 with RW; shuffles right foot to move along side bed. VSS.  Stairs            Wheelchair Mobility    Modified Rankin (Stroke Patients Only)       Balance Overall balance assessment: Needs assistance Sitting-balance support: Feet supported;No upper extremity supported Sitting balance-Leahy Scale: Fair Sitting balance - Comments: supervision for safety   Standing balance support: During functional activity Standing balance-Leahy Scale: Poor Standing balance comment: Requires Ue support, difficulty maintaining NWB LLE for long periods                             Pertinent  Vitals/Pain Pain Assessment: Faces Faces Pain Scale: Hurts even more Pain Location: lower right abdomen with coughing/movement Pain Descriptors / Indicators: Grimacing;Guarding;Sore;Discomfort Pain Intervention(s): Repositioned;Monitored during session    Home Living  Family/patient expects to be discharged to:: Private residence Living Arrangements: Alone Available Help at Discharge: Friend(s)(has a partner, Maurice Nguyen, who comes over everyday, been together for 30 years.) Type of Home: House Home Access: Stairs to enter Entrance Stairs-Rails: None Entrance Stairs-Number of Steps: 3 Home Layout: One level Home Equipment: None      Prior Function Level of Independence: Needs assistance   Gait / Transfers Assistance Needed: Has not been walking for 3 weeks due to foot pain.  ADL's / Homemaking Assistance Needed: some assist needed for bathing.  Comments: Sleeps in a recliner at home     Hand Dominance   Dominant Hand: Right    Extremity/Trunk Assessment   Upper Extremity Assessment Upper Extremity Assessment: Defer to OT evaluation    Lower Extremity Assessment Lower Extremity Assessment: RLE deficits/detail;LLE deficits/detail RLE Deficits / Details: Grossly ~4/5 throughout RLE Sensation: decreased light touch LLE Deficits / Details: Grossly ~4/5 throughout LLE Sensation: decreased light touch    Cervical / Trunk Assessment Cervical / Trunk Assessment: Normal  Communication   Communication: No difficulties  Cognition Arousal/Alertness: Awake/alert Behavior During Therapy: WFL for tasks assessed/performed Overall Cognitive Status: Within Functional Limits for tasks assessed                                 General Comments: poor understanding of medical conditions      General Comments General comments (skin integrity, edema, etc.): Partner, Maurice Nguyen, present during session. Pt wanted her to stay.    Exercises     Assessment/Plan    PT Assessment Patient needs continued PT services  PT Problem List Decreased strength;Decreased mobility;Impaired sensation;Decreased balance;Decreased skin integrity;Decreased knowledge of use of DME       PT Treatment Interventions Therapeutic activities;Gait training;Therapeutic  exercise;Patient/family education;Wheelchair mobility training;Balance training;Functional mobility training    PT Goals (Current goals can be found in the Care Plan section)  Acute Rehab PT Goals Patient Stated Goal: to return to independence PT Goal Formulation: With patient Time For Goal Achievement: 10/30/19 Potential to Achieve Goals: Good    Frequency Min 3X/week   Barriers to discharge Decreased caregiver support;Inaccessible home environment lives alone; stairs    Co-evaluation PT/OT/SLP Co-Evaluation/Treatment: Yes Reason for Co-Treatment: To address functional/ADL transfers;For patient/therapist safety PT goals addressed during session: Mobility/safety with mobility         AM-PAC PT "6 Clicks" Mobility  Outcome Measure Help needed turning from your back to your side while in a flat bed without using bedrails?: A Little Help needed moving from lying on your back to sitting on the side of a flat bed without using bedrails?: A Little Help needed moving to and from a bed to a chair (including a wheelchair)?: A Little Help needed standing up from a chair using your arms (e.g., wheelchair or bedside chair)?: A Little Help needed to walk in hospital room?: A Little Help needed climbing 3-5 steps with a railing? : Total 6 Click Score: 16    End of Session   Activity Tolerance: Patient tolerated treatment well Patient left: in bed;with call bell/phone within reach;with family/visitor present Nurse Communication: Mobility status PT Visit Diagnosis: Pain;Muscle weakness (generalized) (M62.81);Unsteadiness on feet (R26.81);Difficulty in walking, not elsewhere classified (R26.2) Pain - part of  body: (abdomen)    Time: 9390-3009 PT Time Calculation (min) (ACUTE ONLY): 47 min   Charges:   PT Evaluation $PT Eval Moderate Complexity: 1 Mod PT Treatments $Therapeutic Activity: 8-22 mins        Marisa Severin, PT, DPT Acute Rehabilitation Services Pager [phone] Office  [phone]      Marguarite Arbour A Sabra Heck 10/16/2019, 11:39 AM

## 2019-10-16 NOTE — Progress Notes (Signed)
Pt c/o 10/10 sharp pain to right groin incision site with coughing. Site is unremarkable, soft to touch. RLE pulses intact with cap refill less than 3 sec. Pt stated he mentioned it to MD during day shift and pain meds were ordered. Pain med given at this time per MD orders.

## 2019-10-16 NOTE — Progress Notes (Addendum)
Tessalon perles given for cough at this time per MD orders as pt is still c/o pain with cough to right groin. Site continues to be unremarkable and soft to touch with RLE pulses intact and warm to touch.

## 2019-10-16 NOTE — Op Note (Signed)
PATIENT:  Maurice Nguyen  54 y.o. male  PRE-OPERATIVE DIAGNOSIS:  ulceration  POST-OPERATIVE DIAGNOSIS:  ulceration  PROCEDURE:  Procedure(s): DEBRIDEMENT WOUND, POSSIBLE WOUND VAC APPLICATION (Left)  SURGEON:  Surgeon(s) and Role:    * Trula Slade, DPM - Primary  PHYSICIAN ASSISTANT:   ASSISTANTS: none   ANESTHESIA:   MAC  EBL:  Minimal    BLOOD ADMINISTERED:none  DRAINS: none   LOCAL MEDICATIONS USED:  OTHER 20 cc lidocaine and marcaine plain  SPECIMEN:  Source of Specimen:  wound culture   DISPOSITION OF SPECIMEN:  PATHOLOGY  COUNTS:  YES  TOURNIQUET:  * No tourniquets in log *  DICTATION: .Dragon Dictation  PLAN OF CARE: Admit to inpatient   PATIENT DISPOSITION:  PACU - hemodynamically stable.   Delay start of Pharmacological VTE agent (>24hrs) due to surgical blood loss or risk of bleeding: no  Indications for surgery: 54 year old male was admitted to the hospital for A. fib and RVR.  He had not taken his medications in 2 weeks because he is having pain to the foot.  Upon admission there was worsening the ulceration the left foot.  CT scan showed skin defect in the plantar aspect the midfoot adjacent with fifth metatarsal with patchy soft tissue air localized the superficial soft tissues which could have been related to the ulceration.  There is no evidence of osteomyelitis on CT scan.  Given the wound recommended debridement.  He was taken the operating room for incision drainage, wound debridement.  Due to the nature of the wound recommended to return to the operating room for wound debridement possible graft and/or VAC applications.  We can discuss the surgery as well as the postoperative course.  Alternatives, risks, complications discussed.  No promises or guarantees were given at that time of the surgery.  Procedure in detail: Patient was both verbally and visually identified by myself with the nursing, anesthesia and staff.  He  was then transferred the operating via stretcher.  He was left on the stretcher for the procedure.  After adequate plane of anesthesia mixture of lidocaine, Marcaine plain was infiltrated in a regional block fashion.  Also infiltrated additional 10 cc during the procedure.  The left lower extremity was then scrubbed, prepped, draped in normal sterile fashion.  Timeout was performed.  Utilizing a #15 blade scalpel in order to debride both the ulcerations.  Hemostat was utilized and along the midfoot ulceration there was tracking along the medial aspect of foot and there was an area of purulence identified.  I utilized a 15 blade scalpel in order to excise tissue overlying the area of infection.  I then utilized a rongeur to debride the tissue.  Upon further inspection there was no further purulence identified.  I then utilized Misonix debridement in order to debride the tissue.  After debridement there was no further purulence identified the tissue appeared to be healthy and bleeding.  Further inspection did not reveal any other signs of infection.    I also sharply debrided the heel ulceration 15 with scalpel, rongeur as well as with the Misonix.  After debridement of the heel it measured 4 x 3.5 x 1.5 cm and there is no probing to bone, undermining or tunneling.  After debridement the midfoot wound and after excision of the tissue the wound measures 7 x 2.5 x 1.5 cm.  There is no infection of the heel I did utilize Acell micromatrix as well as graft.  I also do did apply  mitral matrix to the other wound but did not cover with the graft.  I used a total of 1000 mg of micromatrix and I utilized a 5 x 5 cm 3 layer Cytal graft to the heel.   To the heel petroleum jelly was applied followed by Adaptic at both of the wounds followed by dry sterile dressing.  He is awoken anesthesia and found to tolerate the procedure well.  He was transferred to PACU vital signs stable and vascular status intact.  We then IV  antibiotics.  We will plan on changing the dressing on Wednesday. Elevation. Likely discharge to SNF.

## 2019-10-16 NOTE — Progress Notes (Addendum)
Attempted to given tessalon perles but pt was asleep at this time.

## 2019-10-16 NOTE — Progress Notes (Signed)
PROGRESS NOTE  Maurice Nguyen [name]:[id number] DOB: [birth year]   PCP: Bernerd Limbo, MD  Patient is from: home.  Bedbound?  DOA: 10/08/2019 LOS: 7  Brief Narrative / Interim history: 54 yo AA male with history of uncontrolled DM-2 with left foot wound, HTN, systolic HF EF 40-34%, paroxysmal A fib and debility presenting with worsening left foot infection and worsening BLE edema.  He has been followed by Dr. [name] of Podiatry, was scheduled also to consult with vascular surgery Dr. [name] on 3/18.  Patient stopped his diuretics for about 2 weeks as he was unable to get to the bathroom.   CT left foot did not reveal focal fluid collection or abscess, no CT findings of osteo.  He was also found to be in A. fib with RVR and started on Cardizem drip and IV Lasix.   He was admitted for diabetic left foot wound in the setting of possible PAD, combined CHF exacerbation and A. fib with RVR.  Podiatry, vascular surgery and cardiology following.  Patient underwent debridement of left diabetic foot ulcer by podiatry on 3/17.  Then he  underwent LLE angiogram and LHC on 3/18.  LLE angiogram revealed occlusion of posterior tibial artery in mid calf with reconstitution via peroneal which is the main runoff to the foot but no flow obstructing stenosis proximally.  LHC with moderately severe stenosis in a small posterolateral branch of RCA but no other significant CAD.  Patient had left-sided headache behind his eye and blurry vision after his catheterizations.  Also mild tenderness over left temporal area.  CRP and ESR elevated but difficult to interpret in the setting of diabetic foot infection.  Eye pain and vision improved.  Eye exam reassuring.  Subjective: Seen and examined earlier this morning.  No major events overnight or this morning.  Continues to complain sharp pain over right groin.  Pain worse when he tries to lean backward.  No numbness or tingling in his legs.  No GI or UTI symptoms.  Denies  chest pain or dyspnea.  Objective: Vitals:   10/15/19 1925 10/16/19 0502 10/16/19 0800 10/16/19 1108  BP: (!) 121/98 117/86 (!) 118/94 136/79  Pulse: (!) 36 93 84 88  Resp: '20 18 18 ' (!) 22  Temp: 98.6 F (37 C) 98.3 F (36.8 C) 98 F (36.7 C) 98.4 F (36.9 C)  TempSrc:   Oral Oral  SpO2: 98% 97% 97% 97%  Weight:  (!) 188.1 kg    Height:        Intake/Output Summary (Last 24 hours) at 10/16/2019 1358 Last data filed at 10/16/2019 1240 Gross per 24 hour  Intake 2517.49 ml  Output 4770 ml  Net -2252.51 ml   Filed Weights   10/14/19 0555 10/15/19 0600 10/16/19 0502  Weight: (!) 190.3 kg (!) 186.2 kg (!) 188.1 kg    Examination:  GENERAL: No acute distress.  Appears well.  HEENT: MMM.  Vision and hearing grossly intact.  NECK: Supple.  No apparent JVD.  RESP: On RA.  No IWOB. Good air movement bilaterally. CVS:  RRR. Heart sounds normal.  ABD/GI/GU: Bowel sounds present. Soft.  Some tenderness over lower abdomen above inguinal fold.  No erythema or loculation.  Right groin access appears clean without drainage or bleeding. MSK/EXT: Moves extremities.  BLE stasis dermatitis.  Trace edema bilaterally. SKIN: BLE stasis dermatitis.  Dressing over left foot DTI. NEURO: Awake, alert and oriented appropriately.  No apparent focal neuro deficit. PSYCH: Calm. Normal affect.   Procedures:  3/17-left foot wound debridement by Dr. [name]  3/18-LLE angiogram revealed occlusion of posterior tibial artery in mid calf with reconstitution via peroneal which is the main runoff to the foot but no flow obstructing stenosis proximally.    3/18-LHC with moderately severe stenosis in a small posterolateral branch of RCA but no other significant CAD.  3/22-left foot wound debridement by Dr. Earleen Newport   Assessment & Plan: Diabetic foot wound/abscess with peripheral artery disease-ABI completed as an outpatient on 3/3 which revealed diminished flow to the left foot. CRP 12, sed rate 53.  Blood  cultures negative so far.  Tissue culture on 3/17 with Proteus mirabilis. -Debridement by podiatry, Dr. [name] on 3/17 and 3/27 -LLE angiogram as above. -Augmentin --> Vanco/zosyn> Unasyn -PT/OT eval for possible rehab placement  A. fib RVR: Likely due to fluid overload.  Rate controlled on p.o. Cardizem -On p.o. Coreg 6.25 mg twice daily -On IV heparin for anticoagulation  Acute on chronic combined systolic and diastolic heart failure: Echo with EF of 30 to 35% (unchanged from 2016), indeterminate LV diastolic and RV function, moderate LAE and ascending aortic dilation to 39 mm.  LHC as above.  Responding to IV Lasix.  About 2.9 L UOP in the last 24 hours.  Net -19 L since admit.  Renal function slightly up. -Cardiology managing-on IV Lasix 80 mg twice daily.  Plan to start p.o. tomorrow -GDMT-Coreg, Delene Loll -Could benefit from [place]. -Monitor fluid status, renal function and electrolytes -Sodium and fluid restriction -Appreciate dietitian counseling.  Left sided headache/blurry vision-resolved.  Exam reassuring.  ESR and CRP elevation unreliable in the setting of diabetic foot infection.  Uncontrolled DM-2 with hyperglycemia and diabetic foot ulcer: A1c 11.2%.  Hyperglycemia likely due to dexamethasone he received.  Hyperglycemia improving Recent Labs    10/15/19 2125 10/16/19 0617 10/16/19 1109  GLUCAP 322* 240* 238*  -Increase NPH from 40 to 42 units twice daily and NovoLog from 4 to 6 units daily 3 times daily -Continue SSI -Could benefit from newer agents with cardiovascular benefits  AKI/azotemia: Baseline Cr 0.9-1.1> 1.45 (admit)> 1.18> 1.29>1.26> 1.07> 1.16> 1.29 -Continue monitoring while on diuretics and Entresto  OSA not compliant with CPAP-refused CPAP here despite extensive counseling.  Now considering. -Order nightly CPAP  Morbid obesity: Body mass index is 53.24 kg/m. -May benefit from dietary intervention and GLP-1 inhibitors. -Reportedly bedbound   Debility/physical deconditioning-reportedly bedbound at baseline. -PT/OT -May need a wheelchair.  Constipation -Senokot-S and MiraLAX ordered.  RN to administer twice until bowel movement  Right groin pain-from cath access.  Exam is benign except for mild tenderness over lower abdomen above the inguinal fold.  No erythema or fluid loculation.  Seems like musculoskeletal based on his description.  Could be nerve irritation after catheterization. -Continue monitoring   Hyponatremia-corrected to normal for hyperglycemia. -Continue monitoring       Nutrition Problem: Increased nutrient needs Etiology: wound healing  Signs/Symptoms: estimated needs  Interventions: MVI, Premier Protein, Refer to RD note for recommendations   DVT prophylaxis: On heparin for A. fib Code Status: Full code Family Communication: Updated patient's wife at bedside  Discharge barrier: Surgical debridement of LE diabetic wound, acute CHF on IV Lasix and atrial fibrillation Patient is from: Home Final disposition: To be determined after therapy.  Needs to be cleared by cardiology and podiatry.  Consultants: [facility] (off) podiatry, cardiology   Microbiology summarized: COVID-19 negative MRSA PCR negative Blood cultures negative Tissue culture with Proteus mirabilis.  Sch Meds:  Scheduled Meds: .  aspirin EC  81 mg Oral Daily  . atorvastatin  80 mg Oral q1800  . carvedilol  6.25 mg Oral BID WC  . furosemide  80 mg Intravenous BID  . insulin aspart  0-20 Units Subcutaneous TID WC  . insulin aspart  0-5 Units Subcutaneous QHS  . insulin aspart  6 Units Subcutaneous TID WC  . insulin NPH Human  42 Units Subcutaneous BID AC & HS  . multivitamin with minerals  1 tablet Oral Daily  . Ensure Max Protein  11 oz Oral BID  . saccharomyces boulardii  250 mg Oral BID  . sacubitril-valsartan  1 tablet Oral BID  . sodium chloride flush  10-40 mL Intracatheter Q12H  . sodium chloride flush  3 mL Intravenous Q12H   . spironolactone  25 mg Oral Daily  . Vitamin D (Ergocalciferol)  50,000 Units Oral Q7 days   Continuous Infusions: . sodium chloride    . ampicillin-sulbactam (UNASYN) IV 3 g (10/16/19 1124)  . heparin 3,000 Units/hr (10/16/19 0930)  . lactated ringers 10 mL/hr at 10/15/19 1125   PRN Meds:.sodium chloride, benzonatate, menthol-cetylpyridinium, ondansetron (ZOFRAN) IV, oxyCODONE-acetaminophen, polyethylene glycol, senna-docusate, sodium chloride flush, sodium chloride flush  Antimicrobials: Anti-infectives (From admission, onward)   Start     Dose/Rate Route Frequency Ordered Stop   10/15/19 1300  Ampicillin-Sulbactam (UNASYN) 3 g in sodium chloride 0.9 % 100 mL IVPB     3 g 200 mL/hr over 30 Minutes Intravenous To Surgery 10/15/19 1246 10/15/19 1322   10/15/19 1245  Ampicillin-Sulbactam (UNASYN) injection 3 g  Status:  Discontinued     3 g Intravenous To Surgery 10/15/19 1243 10/15/19 1245   10/12/19 2000  Ampicillin-Sulbactam (UNASYN) 3 g in sodium chloride 0.9 % 100 mL IVPB     3 g 200 mL/hr over 30 Minutes Intravenous Every 6 hours 10/12/19 1602     10/10/19 0430  vancomycin (VANCOREADY) IVPB 1500 mg/300 mL  Status:  Discontinued     1,500 mg 150 mL/hr over 120 Minutes Intravenous Every 12 hours 10/09/19 1003 10/12/19 1520   10/09/19 1200  piperacillin-tazobactam (ZOSYN) IVPB 3.375 g  Status:  Discontinued     3.375 g 12.5 mL/hr over 240 Minutes Intravenous Every 8 hours 10/09/19 0946 10/12/19 1520   10/09/19 1015  vancomycin (VANCOCIN) 2,500 mg in sodium chloride 0.9 % 500 mL IVPB     2,500 mg 250 mL/hr over 120 Minutes Intravenous  Once 10/09/19 1003 10/09/19 1834   10/09/19 1000  amoxicillin-clavulanate (AUGMENTIN) 875-125 MG per tablet 1 tablet  Status:  Discontinued     1 tablet Oral Every 12 hours 10/08/19 2155 10/09/19 0946   10/08/19 2145  amoxicillin-clavulanate (AUGMENTIN) 875-125 MG per tablet 1 tablet     1 tablet Oral  Once 10/08/19 2130 10/08/19 2247       I  have personally reviewed the following labs and images: CBC: Recent Labs  Lab 10/12/19 0152 10/13/19 0640 10/14/19 0406 10/15/19 0534 10/16/19 0407  WBC 10.9* 10.0 9.7 10.2 12.4*  HGB 12.9* 12.2* 12.9* 13.2 13.2  HCT 39.6 39.1 40.9 41.5 40.8  MCV 95.2 98.2 97.6 96.1 96.0  PLT 266 309 326 352 345   BMP &GFR Recent Labs  Lab 10/11/19 0356 10/11/19 0356 10/12/19 0152 10/13/19 0640 10/14/19 0406 10/15/19 0534 10/16/19 0407  NA 137   < > 135 136 137 139 134*  K 3.9   < > 4.7 4.1 4.3 3.7 4.1  CL 101   < > 99  102 99 100 99  CO2 27   < > '25 25 28 27 24  ' GLUCOSE 190*   < > 292* 268* 157* 160* 253*  BUN 24*   < > 22* 25* 24* 28* 30*  CREATININE 1.28*   < > 1.26* 1.16 1.07 1.16 1.29*  CALCIUM 8.2*   < > 8.3* 8.2* 8.5* 8.8* 8.7*  MG 2.0   < > 2.0 2.0 1.9 1.9 1.9  PHOS 3.8  --  3.1 2.9 4.2 4.8*  --    < > = values in this interval not displayed.   Estimated Creatinine Clearance: 114 mL/min (A) (by C-G formula based on SCr of 1.29 mg/dL (H)). Liver & Pancreas: Recent Labs  Lab 10/11/19 0356 10/12/19 0152 10/13/19 0640 10/14/19 0406 10/15/19 0534  ALBUMIN 2.4* 2.7* 2.6* 2.6* 2.7*   No results for input(s): LIPASE, AMYLASE in the last 168 hours. No results for input(s): AMMONIA in the last 168 hours. Diabetic: No results for input(s): HGBA1C in the last 72 hours. Recent Labs  Lab 10/15/19 1336 10/15/19 1622 10/15/19 2125 10/16/19 0617 10/16/19 1109  GLUCAP 70 144* 322* 240* 238*   Cardiac Enzymes: No results for input(s): CKTOTAL, CKMB, CKMBINDEX, TROPONINI in the last 168 hours. No results for input(s): PROBNP in the last 8760 hours. Coagulation Profile: Recent Labs  Lab 10/11/19 0356  INR 1.3*   Thyroid Function Tests: No results for input(s): TSH, T4TOTAL, FREET4, T3FREE, THYROIDAB in the last 72 hours. Lipid Profile: No results for input(s): CHOL, HDL, LDLCALC, TRIG, CHOLHDL, LDLDIRECT in the last 72 hours. Anemia Panel: No results for input(s):  VITAMINB12, FOLATE, FERRITIN, TIBC, IRON, RETICCTPCT in the last 72 hours. Urine analysis:    Component Value Date/Time   COLORURINE AMBER (A) 10/01/2015 1652   APPEARANCEUR CLOUDY (A) 10/01/2015 1652   LABSPEC 1.027 10/01/2015 1652   PHURINE 5.5 10/01/2015 1652   GLUCOSEU >1000 (A) 10/01/2015 1652   HGBUR NEGATIVE 10/01/2015 1652   BILIRUBINUR NEGATIVE 10/01/2015 1652   KETONESUR NEGATIVE 10/01/2015 1652   PROTEINUR 30 (A) 10/01/2015 1652   UROBILINOGEN 1.0 07/07/2012 1544   NITRITE NEGATIVE 10/01/2015 1652   LEUKOCYTESUR NEGATIVE 10/01/2015 1652   Sepsis Labs: Invalid input(s): PROCALCITONIN, [place]  Microbiology: Recent Results (from the past 240 hour(s))  SARS CORONAVIRUS 2 (TAT 6-24 HRS) Nasopharyngeal Nasopharyngeal Swab     Status: None   Collection Time: 10/08/19  6:08 PM   Specimen: Nasopharyngeal Swab  Result Value Ref Range Status   SARS Coronavirus 2 NEGATIVE NEGATIVE Final    Comment: (NOTE) SARS-CoV-2 target nucleic acids are NOT DETECTED. The SARS-CoV-2 RNA is generally detectable in upper and lower respiratory specimens during the acute phase of infection. Negative results do not preclude SARS-CoV-2 infection, do not rule out co-infections with other pathogens, and should not be used as the sole basis for treatment or other patient management decisions. Negative results must be combined with clinical observations, patient history, and epidemiological information. The expected result is Negative. Fact Sheet for Patients: SugarRoll.be Fact Sheet for Healthcare Providers: https://www.woods-mathews.com/ This test is not yet approved or cleared by the [place] FDA and  has been authorized for detection and/or diagnosis of SARS-CoV-2 by FDA under an Emergency Use Authorization (EUA). This EUA will remain  in effect (meaning this test can be used) for the duration of the COVID-19 declaration under Section 56  4(b)(1) of the Act, 21 U.S.C. section 360bbb-3(b)(1), unless the authorization is terminated or revoked sooner. Performed at [facility]  Lab, [address] [place], [place] [address]   Blood Cultures x 2 sites     Status: None   Collection Time: 10/08/19 10:24 PM   Specimen: BLOOD  Result Value Ref Range Status   Specimen Description BLOOD RIGHT UPPER ARM  Final   Special Requests   Final    BOTTLES DRAWN AEROBIC AND ANAEROBIC Blood Culture results may not be optimal due to an inadequate volume of blood received in culture bottles   Culture   Final    NO GROWTH 5 DAYS Performed at [facility], [place] [address], [place], [place] [address]    Report Status 10/13/2019 FINAL  Final  Blood Cultures x 2 sites     Status: None   Collection Time: 10/08/19 10:45 PM   Specimen: BLOOD  Result Value Ref Range Status   Specimen Description BLOOD LEFT ARM  Final   Special Requests   Final    BOTTLES DRAWN AEROBIC AND ANAEROBIC Blood Culture results may not be optimal due to an inadequate volume of blood received in culture bottles   Culture   Final    NO GROWTH 5 DAYS Performed at [facility], [place] [address] [place], [place] [address]    Report Status 10/13/2019 FINAL  Final  MRSA PCR Screening     Status: None   Collection Time: 10/09/19  3:12 AM   Specimen: Nasopharyngeal  Result Value Ref Range Status   MRSA by PCR NEGATIVE NEGATIVE Final    Comment:        The GeneXpert MRSA Assay (FDA approved for NASAL specimens only), is one component of a comprehensive MRSA colonization surveillance program. It is not intended to diagnose MRSA infection nor to guide or monitor treatment for MRSA infections. Performed at [facility], [place] [address] [place], [place] [address]   Aerobic/Anaerobic Culture (surgical/deep wound)     Status: None   Collection Time: 10/10/19 12:15 PM   Specimen: Wound  Result Value Ref Range Status   Specimen Description WOUND  Final    Special Requests LEFT FOOT  Final   Gram Stain   Final    FEW WBC PRESENT, PREDOMINANTLY PMN MODERATE GRAM POSITIVE COCCI    Culture   Final    FEW PROTEUS MIRABILIS FEW BACTEROIDES THETAIOTAOMICRON BETA LACTAMASE POSITIVE Performed at [facility], [place] [address] [place], [place] [address]    Report Status 10/14/2019 FINAL  Final   Organism ID, Bacteria PROTEUS MIRABILIS  Final      Susceptibility   Proteus mirabilis - MIC*    AMPICILLIN RESISTANT Resistant     CEFAZOLIN 32 INTERMEDIATE Intermediate     CEFEPIME <=0.12 SENSITIVE Sensitive     CEFTAZIDIME <=1 SENSITIVE Sensitive     CEFTRIAXONE <=0.25 SENSITIVE Sensitive     CIPROFLOXACIN <=0.25 SENSITIVE Sensitive     GENTAMICIN <=1 SENSITIVE Sensitive     IMIPENEM 2 SENSITIVE Sensitive     TRIMETH/SULFA <=20 SENSITIVE Sensitive     AMPICILLIN/SULBACTAM <=2 SENSITIVE Sensitive     PIP/TAZO <=4 SENSITIVE Sensitive     * FEW PROTEUS MIRABILIS  Surgical pcr screen     Status: None   Collection Time: 10/14/19  8:41 PM   Specimen: Nasal Mucosa; Nasal Swab  Result Value Ref Range Status   MRSA, PCR NEGATIVE NEGATIVE Final   Staphylococcus aureus NEGATIVE NEGATIVE Final    Comment: (NOTE) The Xpert SA Assay (FDA approved for NASAL specimens in patients  64 years of age and older), is one component of a comprehensive surveillance program. It is not intended to diagnose infection nor to guide or monitor treatment. Performed at [facility], [place] [address], [place], [place] [address]   Aerobic/Anaerobic Culture (surgical/deep wound)     Status: None (Preliminary result)   Collection Time: 10/15/19 12:59 PM   Specimen: PATH Other; Tissue  Result Value Ref Range Status   Specimen Description FOOT WOUND LEFT  Final   Special Requests NONE  Final   Gram Stain   Final    FEW WBC PRESENT, PREDOMINANTLY PMN NO ORGANISMS SEEN Performed at [facility], [address], [place], [place] [address]    Culture   Final     RARE PROTEUS MIRABILIS CULTURE REINCUBATED FOR BETTER GROWTH    Report Status PENDING  Incomplete    Radiology Studies: No results found.    T. [place]  If 7PM-7AM, please contact night-coverage [email or web address] Password [facility] 10/16/2019, 1:58 PM

## 2019-10-17 ENCOUNTER — Inpatient Hospital Stay (HOSPITAL_COMMUNITY): Payer: Medicare HMO

## 2019-10-17 DIAGNOSIS — R10813 Right lower quadrant abdominal tenderness: Secondary | ICD-10-CM

## 2019-10-17 DIAGNOSIS — R1031 Right lower quadrant pain: Secondary | ICD-10-CM

## 2019-10-17 LAB — BASIC METABOLIC PANEL
Anion gap: 11 (ref 5–15)
BUN: 26 mg/dL — ABNORMAL HIGH (ref 6–20)
CO2: 26 mmol/L (ref 22–32)
Calcium: 8.7 mg/dL — ABNORMAL LOW (ref 8.9–10.3)
Chloride: 99 mmol/L (ref 98–111)
Creatinine, Ser: 1.14 mg/dL (ref 0.61–1.24)
GFR calc Af Amer: >60 mL/min (ref >60)
GFR calc non Af Amer: >60 mL/min (ref >60)
Glucose, Bld: 216 mg/dL — ABNORMAL HIGH (ref 70–99)
Potassium: 4 mmol/L (ref 3.5–5.1)
Sodium: 136 mmol/L (ref 135–145)

## 2019-10-17 LAB — MAGNESIUM: Magnesium: 1.8 mg/dL (ref 1.7–2.4)

## 2019-10-17 LAB — GLUCOSE, CAPILLARY
Glucose-Capillary: 178 mg/dL — ABNORMAL HIGH (ref 70–99)
Glucose-Capillary: 160 mg/dL — ABNORMAL HIGH (ref 70–99)
Glucose-Capillary: 142 mg/dL — ABNORMAL HIGH (ref 70–99)
Glucose-Capillary: 127 mg/dL — ABNORMAL HIGH (ref 70–99)

## 2019-10-17 LAB — CBC
HCT: 41.4 % (ref 39.0–52.0)
Hemoglobin: 13.2 g/dL (ref 13.0–17.0)
MCH: 30.8 pg (ref 26.0–34.0)
MCHC: 31.9 g/dL (ref 30.0–36.0)
MCV: 96.5 fL (ref 80.0–100.0)
Platelets: 354 10*3/uL (ref 150–400)
RBC: 4.29 MIL/uL (ref 4.22–5.81)
RDW: 13.8 % (ref 11.5–15.5)
WBC: 10.3 10*3/uL (ref 4.0–10.5)
nRBC: 0 % (ref 0.0–0.2)

## 2019-10-17 LAB — SARS CORONAVIRUS 2 (TAT 6-24 HRS): SARS Coronavirus 2: NEGATIVE

## 2019-10-17 MED ORDER — AMOXICILLIN-POT CLAVULANATE 875-125 MG PO TABS
1.0000 | ORAL_TABLET | Freq: Two times a day (BID) | ORAL | Status: DC
  Administered 2019-10-17 – 2019-10-19 (×4): 1 via ORAL
  Filled 2019-10-17 (×5): qty 1

## 2019-10-17 MED ORDER — CARVEDILOL 12.5 MG PO TABS
12.5000 mg | ORAL_TABLET | Freq: Two times a day (BID) | ORAL | Status: DC
  Administered 2019-10-17 – 2019-10-19 (×4): 12.5 mg via ORAL
  Filled 2019-10-17 (×4): qty 1

## 2019-10-17 MED ORDER — FUROSEMIDE 80 MG PO TABS
80.0000 mg | ORAL_TABLET | Freq: Two times a day (BID) | ORAL | Status: DC
  Administered 2019-10-17 – 2019-10-19 (×4): 80 mg via ORAL
  Filled 2019-10-17 (×4): qty 1

## 2019-10-17 MED ORDER — IOHEXOL 300 MG/ML  SOLN
100.0000 mL | Freq: Once | INTRAMUSCULAR | Status: AC | PRN
  Administered 2019-10-17: 100 mL via INTRAVENOUS

## 2019-10-17 NOTE — Progress Notes (Signed)
PROGRESS NOTE  Maurice Nguyen IDC:[id number] DOB: [birth year]   PCP: Bernerd Limbo, MD  Patient is from: home.  Bedbound?  DOA: 10/08/2019 LOS: 8  Brief Narrative / Interim history: 54 yo AA male with history of uncontrolled DM-2 with left foot wound, HTN, systolic HF EF 87-57%, paroxysmal A fib and debility presenting with worsening left foot infection and worsening BLE edema.  He has been followed by Dr. [name] of Podiatry, was scheduled also to consult with vascular surgery Dr. [name] on 3/18.  Patient stopped his diuretics for about 2 weeks as he was unable to get to the bathroom.   CT left foot did not reveal focal fluid collection or abscess, no CT findings of osteo.  He was also found to be in A. fib with RVR and started on Cardizem drip and IV Lasix.   He was admitted for diabetic left foot wound in the setting of possible PAD, combined CHF exacerbation and A. fib with RVR.  Podiatry, vascular surgery and cardiology following.  Patient underwent debridement of left diabetic foot ulcer by podiatry on 3/17.  Then he  underwent LLE angiogram and LHC on 3/18.  LLE angiogram revealed occlusion of posterior tibial artery in mid calf with reconstitution via peroneal which is the main runoff to the foot but no flow obstructing stenosis proximally.  LHC with moderately severe stenosis in a small posterolateral branch of RCA but no other significant CAD.  Patient had left-sided headache behind his eye and blurry vision after his catheterizations.  Also mild tenderness over left temporal area.  CRP and ESR elevated but difficult to interpret in the setting of diabetic foot infection.  Eye pain and vision resolved.  Subjective: Seen and examined earlier this morning.  No major events overnight or this morning.  Continues to complain pain over her right groin area.  Worse with movement and position.  Denies GI or UTI symptoms.  Denies chest pain, dyspnea or palpitation.   Objective: Vitals:   10/16/19 1701 10/16/19 2035 10/16/19 2100 10/17/19 0610  BP: 122/88 101/88  (!) 144/105  Pulse: 85 85  87  Resp: _0 Temp:  98.8 F (37.1 C)  98.6 F (37 C)  TempSrc:  Oral  Oral  SpO2: 97% 93% 92% 97%  Weight:    (!) 181.4 kg  Height:        Intake/Output Summary (Last 24 hours) at 10/17/2019 1430 Last data filed at 10/17/2019 1300 Gross per 24 hour  Intake 1600 ml  Output 4050 ml  Net -2450 ml   Filed Weights   10/15/19 0600 10/16/19 0502 10/17/19 0610  Weight: (!) 186.2 kg (!) 188.1 kg (!) 181.4 kg    Examination:  GENERAL: No acute distress.  Appears well.  HEENT: MMM.  Vision and hearing grossly intact.  NECK: Supple.  No apparent JVD.  RESP:  No IWOB. Good air movement bilaterally. CVS:  RRR. Heart sounds normal.  ABD/GI/GU: Bowel sounds present. Soft.  Very tender to light palpation over RLQ.  No notable skin erythema, loculation or drainage from femoral cath access area.  No significant tenderness over Axis area. MSK/EXT:  Moves extremities. Trace edema bilaterally SKIN: BLE stasis dermatitis.  Dressing over left foot DTI. NEURO: Awake, alert and oriented appropriately.  No apparent focal neuro deficit. PSYCH: Calm. Normal affect.  Procedures:  3/17-left foot wound debridement by Dr. [name]  3/18-LLE angiogram revealed occlusion of posterior tibial artery in mid calf with reconstitution via peroneal which  is the main runoff to the foot but no flow obstructing stenosis proximally.    3/18-LHC with moderately severe stenosis in a small posterolateral branch of RCA but no other significant CAD.  3/22-left foot wound debridement by Dr. Earleen Newport   Assessment & Plan: Diabetic foot wound/abscess with peripheral artery disease-ABI completed as an outpatient on 3/3 which revealed diminished flow to the left foot. CRP 12, sed rate 53.  Blood cultures negative so far.  Tissue culture on 3/17 with Proteus mirabilis. -Debridement by podiatry, Dr. [name] on 3/17 and  3/27 -LLE angiogram as above. -Patient will be nonweightbearing on LLE. -Augmentin --> Vanco/zosyn> Unasyn> Augmentin -PT/OT-SNF.  A. fib RVR: Likely due to fluid overload.  Rate controlled on Coreg. -Cardiology increased Coreg to 12.5 mg twice daily. -On Xarelto for anticoagulation now.  Acute on chronic combined systolic and diastolic heart failure: Echo with EF of 30 to 35% (unchanged from 2016), indeterminate LV diastolic and RV function, moderate LAE and ascending aortic dilation to 39 mm.  LHC as above.  Responding to IV Lasix.  About 4.5 L UOP in the last 24 hours.  Net -21 L.  30 pounds down since admit.  Renal function stable. -Transition to oral Lasix 40 mg twice daily by cardiology today -GDMT-Coreg, Entresto and spironolactone -We will start low-dose Jardiance at discharge. -Monitor fluid status, renal function and electrolytes -Sodium and fluid restriction -Appreciate dietitian counseling. -Cardiology signed off.  Left sided headache/blurry vision-resolved.  Exam reassuring.  ESR and CRP elevation unreliable in the setting of diabetic foot infection.  Uncontrolled DM-2 with hyperglycemia and diabetic foot ulcer: A1c 11.2%.  Hyperglycemia likely due to dexamethasone he received.  Hyperglycemia improving Recent Labs    10/16/19 2141 10/17/19 0613 10/17/19 1204  GLUCAP 186* 178* 160*  -NPH 42 units twice daily, NovoLog 6 units AC and SSI-moderate.  -Will add Jardiance at discharge. -Continue statin.  AKI/azotemia: Baseline Cr 0.9-1.1> 1.45 (admit)> 1.18> 1.29>1.26> 1.07> 1.16> 1.29> 1.14 -Continue monitoring  OSA not compliant with CPAP-refused CPAP here despite extensive counseling.   Morbid obesity: Body mass index is 51.36 kg/m. -May benefit from dietary intervention and GLP-1 inhibitors. -Reportedly bedbound  Debility/physical deconditioning -PT/OT-SNF  Constipation -Senokot-S and MiraLAX ordered.  RN to administer twice until bowel movement  RLQ  pain/tenderness-patient has significant tenderness to palpation of RLQ. Cath access site appears clean with minimal tenderness. -We will obtain CT abdomen and pelvis to exclude infectious process  Hyponatremia-resolved. -Continue monitoring       Nutrition Problem: Increased nutrient needs Etiology: wound healing  Signs/Symptoms: estimated needs  Interventions: MVI, Premier Protein, Refer to RD note for recommendations   DVT prophylaxis: On heparin for A. fib Code Status: Full code Family Communication: Updated patient's wife at bedside  Discharge barrier: Would be medically stable for discharge to SNF if CT abdomen and pelvis nonrevealing. Patient is from: Home Final disposition: SNF in the next 24 to 48 hours.  Consultants: VVS (off), podiatry (off), cardiology (off)   Microbiology summarized: COVID-19 negative MRSA PCR negative Blood cultures negative Tissue culture with Proteus mirabilis.  Sch Meds:  Scheduled Meds: . amoxicillin-clavulanate  1 tablet Oral Q12H  . atorvastatin  80 mg Oral q1800  . carvedilol  12.5 mg Oral BID WC  . furosemide  80 mg Oral BID  . insulin aspart  0-20 Units Subcutaneous TID WC  . insulin aspart  0-5 Units Subcutaneous QHS  . insulin aspart  6 Units Subcutaneous TID WC  . insulin NPH Human  42 Units Subcutaneous BID AC & HS  . multivitamin with minerals  1 tablet Oral Daily  . Ensure Max Protein  11 oz Oral BID  . rivaroxaban  20 mg Oral Q supper  . saccharomyces boulardii  250 mg Oral BID  . sacubitril-valsartan  1 tablet Oral BID  . sodium chloride flush  10-40 mL Intracatheter Q12H  . sodium chloride flush  3 mL Intravenous Q12H  . spironolactone  25 mg Oral Daily  . Vitamin D (Ergocalciferol)  50,000 Units Oral Q7 days   Continuous Infusions: . sodium chloride     PRN Meds:.sodium chloride, benzonatate, menthol-cetylpyridinium, ondansetron (ZOFRAN) IV, oxyCODONE-acetaminophen, polyethylene glycol, senna-docusate, sodium  chloride flush, sodium chloride flush  Antimicrobials: Anti-infectives (From admission, onward)   Start     Dose/Rate Route Frequency Ordered Stop   10/17/19 1800  amoxicillin-clavulanate (AUGMENTIN) 875-125 MG per tablet 1 tablet     1 tablet Oral Every 12 hours 10/17/19 1329     10/15/19 1300  Ampicillin-Sulbactam (UNASYN) 3 g in sodium chloride 0.9 % 100 mL IVPB     3 g 200 mL/hr over 30 Minutes Intravenous To Surgery 10/15/19 1246 10/15/19 1322   10/15/19 1245  Ampicillin-Sulbactam (UNASYN) injection 3 g  Status:  Discontinued     3 g Intravenous To Surgery 10/15/19 1243 10/15/19 1245   10/12/19 2000  Ampicillin-Sulbactam (UNASYN) 3 g in sodium chloride 0.9 % 100 mL IVPB  Status:  Discontinued     3 g 200 mL/hr over 30 Minutes Intravenous Every 6 hours 10/12/19 1602 10/17/19 1329   10/10/19 0430  vancomycin (VANCOREADY) IVPB 1500 mg/300 mL  Status:  Discontinued     1,500 mg 150 mL/hr over 120 Minutes Intravenous Every 12 hours 10/09/19 1003 10/12/19 1520   10/09/19 1200  piperacillin-tazobactam (ZOSYN) IVPB 3.375 g  Status:  Discontinued     3.375 g 12.5 mL/hr over 240 Minutes Intravenous Every 8 hours 10/09/19 0946 10/12/19 1520   10/09/19 1015  vancomycin (VANCOCIN) 2,500 mg in sodium chloride 0.9 % 500 mL IVPB     2,500 mg 250 mL/hr over 120 Minutes Intravenous  Once 10/09/19 1003 10/09/19 1834   10/09/19 1000  amoxicillin-clavulanate (AUGMENTIN) 875-125 MG per tablet 1 tablet  Status:  Discontinued     1 tablet Oral Every 12 hours 10/08/19 2155 10/09/19 0946   10/08/19 2145  amoxicillin-clavulanate (AUGMENTIN) 875-125 MG per tablet 1 tablet     1 tablet Oral  Once 10/08/19 2130 10/08/19 2247       I have personally reviewed the following labs and images: CBC: Recent Labs  Lab 10/13/19 0640 10/14/19 0406 10/15/19 0534 10/16/19 0407 10/17/19 0502  WBC 10.0 9.7 10.2 12.4* 10.3  HGB 12.2* 12.9* 13.2 13.2 13.2  HCT 39.1 40.9 41.5 40.8 41.4  MCV 98.2 97.6 96.1 96.0 96.5   PLT 309 326 352 345 354   BMP &GFR Recent Labs  Lab 10/11/19 0356 10/11/19 0356 10/12/19 0152 10/12/19 0152 10/13/19 0640 10/14/19 0406 10/15/19 0534 10/16/19 0407 10/17/19 0502  NA 137   < > 135   < > 136 137 139 134* 136  K 3.9   < > 4.7   < > 4.1 4.3 3.7 4.1 4.0  CL 101   < > 99   < > 102 99 100 99 99  CO2 27   < > 25   < > _0 GLUCOSE 190*   < > 292*   < >  268* 157* 160* 253* 216*  BUN 24*   < > 22*   < > 25* 24* 28* 30* 26*  CREATININE 1.28*   < > 1.26*   < > 1.16 1.07 1.16 1.29* 1.14  CALCIUM 8.2*   < > 8.3*   < > 8.2* 8.5* 8.8* 8.7* 8.7*  MG 2.0   < > 2.0   < > 2.0 1.9 1.9 1.9 1.8  PHOS 3.8  --  3.1  --  2.9 4.2 4.8*  --   --    < > = values in this interval not displayed.   Estimated Creatinine Clearance: 126.2 mL/min (by C-G formula based on SCr of 1.14 mg/dL). Liver & Pancreas: Recent Labs  Lab 10/11/19 0356 10/12/19 0152 10/13/19 0640 10/14/19 0406 10/15/19 0534  ALBUMIN 2.4* 2.7* 2.6* 2.6* 2.7*   No results for input(s): LIPASE, AMYLASE in the last 168 hours. No results for input(s): AMMONIA in the last 168 hours. Diabetic: No results for input(s): HGBA1C in the last 72 hours. Recent Labs  Lab 10/16/19 1109 10/16/19 1609 10/16/19 2141 10/17/19 0613 10/17/19 1204  GLUCAP 238* 174* 186* 178* 160*   Cardiac Enzymes: No results for input(s): CKTOTAL, CKMB, CKMBINDEX, TROPONINI in the last 168 hours. No results for input(s): PROBNP in the last 8760 hours. Coagulation Profile: Recent Labs  Lab 10/11/19 0356  INR 1.3*   Thyroid Function Tests: No results for input(s): TSH, T4TOTAL, FREET4, T3FREE, THYROIDAB in the last 72 hours. Lipid Profile: No results for input(s): CHOL, HDL, LDLCALC, TRIG, CHOLHDL, LDLDIRECT in the last 72 hours. Anemia Panel: No results for input(s): VITAMINB12, FOLATE, FERRITIN, TIBC, IRON, RETICCTPCT in the last 72 hours. Urine analysis:    Component Value Date/Time   COLORURINE AMBER (A) 10/01/2015 1652    APPEARANCEUR CLOUDY (A) 10/01/2015 1652   LABSPEC 1.027 10/01/2015 1652   PHURINE 5.5 10/01/2015 1652   GLUCOSEU >1000 (A) 10/01/2015 1652   HGBUR NEGATIVE 10/01/2015 1652   BILIRUBINUR NEGATIVE 10/01/2015 1652   KETONESUR NEGATIVE 10/01/2015 1652   PROTEINUR 30 (A) 10/01/2015 1652   UROBILINOGEN 1.0 07/07/2012 1544   NITRITE NEGATIVE 10/01/2015 1652   LEUKOCYTESUR NEGATIVE 10/01/2015 1652   Sepsis Labs: Invalid input(s): PROCALCITONIN, [place]  Microbiology: Recent Results (from the past 240 hour(s))  SARS CORONAVIRUS 2 (TAT 6-24 HRS) Nasopharyngeal Nasopharyngeal Swab     Status: None   Collection Time: 10/08/19  6:08 PM   Specimen: Nasopharyngeal Swab  Result Value Ref Range Status   SARS Coronavirus 2 NEGATIVE NEGATIVE Final    Comment: (NOTE) SARS-CoV-2 target nucleic acids are NOT DETECTED. The SARS-CoV-2 RNA is generally detectable in upper and lower respiratory specimens during the acute phase of infection. Negative results do not preclude SARS-CoV-2 infection, do not rule out co-infections with other pathogens, and should not be used as the sole basis for treatment or other patient management decisions. Negative results must be combined with clinical observations, patient history, and epidemiological information. The expected result is Negative. Fact Sheet for Patients: SugarRoll.be Fact Sheet for Healthcare Providers: https://www.woods-mathews.com/ This test is not yet approved or cleared by the [place] FDA and  has been authorized for detection and/or diagnosis of SARS-CoV-2 by FDA under an Emergency Use Authorization (EUA). This EUA will remain  in effect (meaning this test can be used) for the duration of the COVID-19 declaration under Section 56 4(b)(1) of the Act, 21 U.S.C. section 360bbb-3(b)(1), unless the authorization is terminated or revoked sooner. Performed at [facility]  Lab, [address]  [place], [place] [address]   Blood Cultures x 2 sites     Status: None   Collection Time: 10/08/19 10:24 PM   Specimen: BLOOD  Result Value Ref Range Status   Specimen Description BLOOD RIGHT UPPER ARM  Final   Special Requests   Final    BOTTLES DRAWN AEROBIC AND ANAEROBIC Blood Culture results may not be optimal due to an inadequate volume of blood received in culture bottles   Culture   Final    NO GROWTH 5 DAYS Performed at [facility], [place] [address] [place], [place] [address]    Report Status 10/13/2019 FINAL  Final  Blood Cultures x 2 sites     Status: None   Collection Time: 10/08/19 10:45 PM   Specimen: BLOOD  Result Value Ref Range Status   Specimen Description BLOOD LEFT ARM  Final   Special Requests   Final    BOTTLES DRAWN AEROBIC AND ANAEROBIC Blood Culture results may not be optimal due to an inadequate volume of blood received in culture bottles   Culture   Final    NO GROWTH 5 DAYS Performed at [facility], [place] [address], [place], [place] [address]    Report Status 10/13/2019 FINAL  Final  MRSA PCR Screening     Status: None   Collection Time: 10/09/19  3:12 AM   Specimen: Nasopharyngeal  Result Value Ref Range Status   MRSA by PCR NEGATIVE NEGATIVE Final    Comment:        The GeneXpert MRSA Assay (FDA approved for NASAL specimens only), is one component of a comprehensive MRSA colonization surveillance program. It is not intended to diagnose MRSA infection nor to guide or monitor treatment for MRSA infections. Performed at [facility], [place] [address] [place], [place] [address]   Aerobic/Anaerobic Culture (surgical/deep wound)     Status: None   Collection Time: 10/10/19 12:15 PM   Specimen: Wound  Result Value Ref Range Status   Specimen Description WOUND  Final   Special Requests LEFT FOOT  Final   Gram Stain   Final    FEW WBC PRESENT, PREDOMINANTLY PMN MODERATE GRAM POSITIVE COCCI    Culture   Final    FEW PROTEUS  MIRABILIS FEW BACTEROIDES THETAIOTAOMICRON BETA LACTAMASE POSITIVE Performed at [facility], [place] [address] [place], [place] [address]    Report Status 10/14/2019 FINAL  Final   Organism ID, Bacteria PROTEUS MIRABILIS  Final      Susceptibility   Proteus mirabilis - MIC*    AMPICILLIN RESISTANT Resistant     CEFAZOLIN 32 INTERMEDIATE Intermediate     CEFEPIME <=0.12 SENSITIVE Sensitive     CEFTAZIDIME <=1 SENSITIVE Sensitive     CEFTRIAXONE <=0.25 SENSITIVE Sensitive     CIPROFLOXACIN <=0.25 SENSITIVE Sensitive     GENTAMICIN <=1 SENSITIVE Sensitive     IMIPENEM 2 SENSITIVE Sensitive     TRIMETH/SULFA <=20 SENSITIVE Sensitive     AMPICILLIN/SULBACTAM <=2 SENSITIVE Sensitive     PIP/TAZO <=4 SENSITIVE Sensitive     * FEW PROTEUS MIRABILIS  Surgical pcr screen     Status: None   Collection Time: 10/14/19  8:41 PM   Specimen: Nasal Mucosa; Nasal Swab  Result Value Ref Range Status   MRSA, PCR NEGATIVE NEGATIVE Final   Staphylococcus aureus NEGATIVE NEGATIVE Final    Comment: (NOTE) The Xpert SA Assay (FDA approved for NASAL specimens in  patients 40 years of age and older), is one component of a comprehensive surveillance program. It is not intended to diagnose infection nor to guide or monitor treatment. Performed at [facility], [place] [address] [place], [place] [address]   Aerobic/Anaerobic Culture (surgical/deep wound)     Status: None (Preliminary result)   Collection Time: 10/15/19 12:59 PM   Specimen: PATH Other; Tissue  Result Value Ref Range Status   Specimen Description FOOT WOUND LEFT  Final   Special Requests NONE  Final   Gram Stain   Final    FEW WBC PRESENT, PREDOMINANTLY PMN NO ORGANISMS SEEN Performed at [facility], [address] [place], [place] [address]    Culture   Final    RARE PROTEUS MIRABILIS SUSCEPTIBILITIES TO FOLLOW NO ANAEROBES ISOLATED; CULTURE IN PROGRESS FOR 5 DAYS    Report Status PENDING  Incomplete     Radiology Studies: No results found.   Britnee Mcdevitt T. Wendell  If 7PM-7AM, please contact night-coverage [email or web address] Password [facility] 10/17/2019, 2:30 PM

## 2019-10-17 NOTE — TOC Progression Note (Addendum)
Transition of Care [facility]) - Progression Note    Patient Details  Name: Maurice Nguyen MRN: [id number] Date of Birth: 17-Sep-1965  [facility] CM/SW Contact  Gildardo Griffes, [place] Phone Number: 10/17/2019, 1:34 PM  Clinical Narrative:     Update: Patient agreeable to [facility], they are able to take patient to and from follow up appointments as well. Pending insurance auth at this time.  CSW notes patient interested in SNF now as he will have a difficult time home alone with dressing changes to foot. CSW faxed out referrals and presented patient with only bed offer of [facility] NO other bed offers at this time, as patient's insurance is not in network with many facilities and many facilities view his weight as a barrier in caring for him. This was explained to patient.   Patient was very aggressive towards CSW, raising voice and unhappy with only bed offer. He reports he will have to discuss with family and asked CSW to come back in an hour for his discharge decision.        Expected Discharge Plan and Services                                                 Social Determinants of Health (SDOH) Interventions    Readmission Risk Interventions No flowsheet data found.

## 2019-10-17 NOTE — Progress Notes (Signed)
Occupational Therapy Treatment Patient Details Name: Maurice Nguyen MRN: [id number] DOB: 06-Nov-1965 Today's Date: 10/17/2019    History of present illness Patient is a 54 y/o male who presents with worsening left foot infection and worsening BLE edema. Also found to be in A-fib with RVR. s/p debridement of left diabetic foot ulcer by podiatry on 3/17. s/p LLE angiogram and LHC on 3/18.  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 s/p wound debridement 3/22. PMH includes HTN, DM, morbid obesity, chronic back pain, combined systolic and diastolic CHF, A-fib, CKD.   OT comments  Pt with chief complaint of R flank pain. With encouragement, participated in bed mobility, standing and grooming activities. Pt unable to ambulate and maintain NWB on L LE, but is able to stand momentarily and off load with use of RW. Pt needing total assist for LB dressing and use of urinal in sitting. Pt is not able to perform dressing changes to L foot. Updated d/c recommendation to SNF for further rehab. Pt is in agreement.   Follow Up Recommendations  SNF;Supervision/Assistance - 24 hour    Equipment Recommendations  Other (comment)(defer to next venue)    Recommendations for Other Services      Precautions / Restrictions Precautions Precautions: Fall Precaution Comments: wound left foot Restrictions Weight Bearing Restrictions: NWB L LE       Mobility Bed Mobility Overal bed mobility: Needs Assistance Bed Mobility: Supine to Sit;Sit to Supine     Supine to sit: Supervision;HOB elevated Sit to supine: Min assist   General bed mobility comments: increased time and effort, assist for R LE back into bed  Transfers Overall transfer level: Needs assistance Equipment used: Rolling walker (2 wheeled) Transfers: Sit to/from Stand Sit to Stand: Min assist         General transfer comment: elevated bed, increased effort and use of momentum, blocked RW  as per pt request, pt unable to stand without weight bearing on L LE    Balance Overall balance assessment: Needs assistance   Sitting balance-Leahy Scale: Fair     Standing balance support: During functional activity;Bilateral upper extremity supported Standing balance-Leahy Scale: Poor Standing balance comment: reliant on B UE support, can unload L UE in static standing, but not with attempts to step                           ADL either performed or assessed with clinical judgement   ADL Overall ADL's : Needs assistance/impaired     Grooming: Wash/dry hands;Wash/dry face;Bed level;Set up               Lower Body Dressing: Total assistance;Sitting/lateral leans Lower Body Dressing Details (indicate cue type and reason): for socks     Toileting- Clothing Manipulation and Hygiene: Total assistance;Bed level Toileting - Clothing Manipulation Details (indicate cue type and reason): total assist to use urinal seated at EOB     Functional mobility during ADLs: (pt unable to walk and maintain NWB)       Vision       Perception     Praxis      Cognition Arousal/Alertness: Awake/alert Behavior During Therapy: WFL for tasks assessed/performed Overall Cognitive Status: Within Functional Limits for tasks assessed                                 General  Comments: pt asking for information about diet, sent chat message to RD        Exercises     Shoulder Instructions       General Comments      Pertinent Vitals/ Pain       Pain Assessment: Faces Faces Pain Scale: Hurts even more Pain Location: R flank Pain Descriptors / Indicators: Grimacing;Guarding;Sore;Discomfort Pain Intervention(s): Monitored during session;Repositioned  Home Living                                          Prior Functioning/Environment              Frequency  Min 2X/week        Progress Toward Goals  OT Goals(current goals can  now be found in the care plan section)  Progress towards OT goals: Not progressing toward goals - comment  Acute Rehab OT Goals Patient Stated Goal: to get L foot wounds to heal OT Goal Formulation: With patient Time For Goal Achievement: 10/30/19 Potential to Achieve Goals: [place] Discharge plan needs to be updated    Co-evaluation    PT/OT/SLP Co-Evaluation/Treatment: Yes Reason for Co-Treatment: For patient/therapist safety   OT goals addressed during session: ADL's and self-care      AM-PAC OT "6 Clicks" Daily Activity     Outcome Measure   Help from another person eating meals?: None Help from another person taking care of personal grooming?: A Little Help from another person toileting, which includes using toliet, bedpan, or urinal?: Total Help from another person bathing (including washing, rinsing, drying)?: A Lot Help from another person to put on and taking off regular upper body clothing?: A Little Help from another person to put on and taking off regular lower body clothing?: Total 6 Click Score: 14    End of Session Equipment Utilized During Treatment: Rolling walker  OT Visit Diagnosis: Other abnormalities of gait and mobility (R26.89);Pain;Muscle weakness (generalized) (M62.81)   Activity Tolerance Patient limited by pain   Patient Left in bed;with call bell/phone within reach   Nurse Communication (aware pt urinated)        Time: 1010-1040 OT Time Calculation (min): 30 min  Charges: OT General Charges $OT Visit: 1 Visit OT Treatments $Self Care/Home Management : 8-22 mins  Nestor Lewandowsky, OTR/L Acute Rehabilitation Services Pager: [phone] Office: [phone]   Malka So 10/17/2019, 11:26 AM

## 2019-10-17 NOTE — Progress Notes (Signed)
Pt states he does not wear CPAP at home and does not want to wear one here as well. Pt has declined use since order was placed, RT to dc order. Pt respiratory status is stable at this time. RT will continue to monitor.

## 2019-10-17 NOTE — Anesthesia Postprocedure Evaluation (Signed)
Anesthesia Post Note  Patient: Maurice Nguyen  Procedure(s) Performed: DEBRIDEMENT WOUND, POSSIBLE WOUND VAC APPLICATION (Left Foot)     Patient location during evaluation: PACU Anesthesia Type: MAC Level of consciousness: awake and alert Pain management: pain level controlled Vital Signs Assessment: post-procedure vital signs reviewed and stable Respiratory status: spontaneous breathing Cardiovascular status: stable Anesthetic complications: no    Last Vitals:  Vitals:   10/16/19 2100 10/17/19 0610  BP:  (!) 144/105  Pulse:  87  Resp:  20  Temp:  37 C  SpO2: 92% 97%    Last Pain:  Vitals:   10/17/19 1023  TempSrc:   PainSc: 0-No pain                 Nolon Nations

## 2019-10-17 NOTE — Progress Notes (Signed)
Physical Therapy Treatment Patient Details Name: Maurice Nguyen MRN: [id number] DOB: Jul 22, 1966 Today's Date: 10/17/2019    History of Present Illness Patient is a 54 y/o male who presents with worsening left foot infection and worsening BLE edema. Also found to be in A-fib with RVR. s/p debridement of left diabetic foot ulcer by podiatry on 3/17. s/p LLE angiogram and LHC on 3/18.  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 s/p wound debridement 3/22. PMH includes HTN, DM, morbid obesity, chronic back pain, combined systolic and diastolic CHF, A-fib, CKD.    PT Comments    Patient seen for mobility progression. Pt agreeable to participate in therapy however distracted by painful R flank and L foot. Pt now with L LE NWB status. Pt requires heavy use of bed rails and assist for bed mobility and able to stand from elevated bed height. Pt unable to maintain NWB L LE to stand but once in standing able to maintain briefly before returning to EOB. Given pt's current mobility level recommend SNF for further skilled PT services to maximize independence and safety with mobility before d/c home alone. PT will continue to follow acutely and progress as tolerated.    Follow Up Recommendations  SNF     Equipment Recommendations  Other (comment)(defer to SNF)    Recommendations for Other Services       Precautions / Restrictions Precautions Precautions: Fall Precaution Comments: wound left foot Restrictions Weight Bearing Restrictions: Yes LLE Weight Bearing: Non weight bearing    Mobility  Bed Mobility Overal bed mobility: Needs Assistance Bed Mobility: Supine to Sit;Sit to Supine     Supine to sit: Supervision;HOB elevated Sit to supine: Min assist   General bed mobility comments: use of rails; increased time and effort;  assist for R LE back into bed  Transfers Overall transfer level: Needs assistance Equipment used: Rolling walker  (2 wheeled) Transfers: Sit to/from Stand Sit to Stand: Min assist;From elevated surface         General transfer comment: attempted to give pt cues for positioning/hand placement for NWB L LE however pt moving impulsively to stand as he was very distracted by pain level; elevated bed, increased effort and use of momentum, blocked RW as per pt request, pt unable to stand without weight bearing on L LE  Ambulation/Gait                 Stairs             Wheelchair Mobility    Modified Rankin (Stroke Patients Only)       Balance Overall balance assessment: Needs assistance   Sitting balance-Leahy Scale: Fair     Standing balance support: During functional activity;Bilateral upper extremity supported Standing balance-Leahy Scale: Poor Standing balance comment: reliant on B UE support, can unload L UE in static standing, but not with attempts to step                            Cognition Arousal/Alertness: Awake/alert Behavior During Therapy: WFL for tasks assessed/performed Overall Cognitive Status: Within Functional Limits for tasks assessed(distracted by pain and moving impulsively at times)                                 General Comments: pt asking for information about diet, OT sent chat message to RD  Exercises      General Comments        Pertinent Vitals/Pain Pain Assessment: Faces Faces Pain Scale: Hurts even more Pain Location: R flank Pain Descriptors / Indicators: Grimacing;Guarding;Sore;Discomfort;Burning Pain Intervention(s): Limited activity within patient's tolerance;Monitored during session;Repositioned    Home Living                      Prior Function            PT Goals (current goals can now be found in the care plan section) Acute Rehab PT Goals Patient Stated Goal: to get L foot wounds to heal Progress towards PT goals: Not progressing toward goals - comment(limited by pain)     Frequency    Min 3X/week      PT Plan Discharge plan needs to be updated    Co-evaluation PT/OT/SLP Co-Evaluation/Treatment: Yes Reason for Co-Treatment: For patient/therapist safety PT goals addressed during session: Mobility/safety with mobility OT goals addressed during session: ADL's and self-care      AM-PAC PT "6 Clicks" Mobility   Outcome Measure  Help needed turning from your back to your side while in a flat bed without using bedrails?: A Little Help needed moving from lying on your back to sitting on the side of a flat bed without using bedrails?: A Lot Help needed moving to and from a bed to a chair (including a wheelchair)?: A Lot Help needed standing up from a chair using your arms (e.g., wheelchair or bedside chair)?: A Little Help needed to walk in hospital room?: Total Help needed climbing 3-5 steps with a railing? : Total 6 Click Score: 12    End of Session   Activity Tolerance: Patient limited by pain Patient left: in bed;with call bell/phone within reach Nurse Communication: Mobility status PT Visit Diagnosis: Pain;Muscle weakness (generalized) (M62.81);Unsteadiness on feet (R26.81);Difficulty in walking, not elsewhere classified (R26.2) Pain - Right/Left: Right Pain - part of body: (R flank and L foot)     Time: 1013-1040 PT Time Calculation (min) (ACUTE ONLY): 27 min  Charges:  $Gait Training: 8-22 mins                     Erline Levine, PTA Acute Rehabilitation Services Pager: [phone] Office: [phone]     Carolynne Edouard 10/17/2019, 12:32 PM

## 2019-10-17 NOTE — NC FL2 (Signed)
[place] MEDICAID FL2 LEVEL OF CARE SCREENING TOOL     IDENTIFICATION  Patient Name: Maurice Nguyen Birthdate: Oct 04, 1965 Sex: male Admission Date (Current Location): 10/08/2019  [facility] and [place] Number:  Producer, television/film/video and Address:  [facility], [address] [place], [place] [address]      Provider Number: [id number]  Attending Physician Name and Address:  Almon Hercules, MD  Relative Name and Phone Number:  Kathrynn Running [phone] (friend)    Current Level of Care: Hospital Recommended Level of Care: Skilled Nursing Facility Prior Approval Number:    Date Approved/Denied:   PASRR Number: [id number]  Discharge Plan: SNF    Current Diagnoses: Patient Active Problem List   Diagnosis Date Noted  . NICM (nonischemic cardiomyopathy) (HCC)   . Diabetic foot ulcer (HCC) 10/09/2019  . Diabetic ulcer of left foot (HCC) 10/08/2019  . Acute on chronic combined systolic (congestive) and diastolic (congestive) heart failure (HCC) 10/08/2019  . Hyperlipidemia associated with type 2 diabetes mellitus (HCC) 11/09/2017  . Morbid obesity with BMI of 50.0-59.9, adult (HCC) 11/09/2017  . B12 deficiency 06/09/2017  . Neuropathy associated with endocrine disorder (HCC) 05/03/2017  . Other fatigue 05/03/2017  . OSA (obstructive sleep apnea) 11/11/2016  . Need for hepatitis C screening test 12/01/2015  . Sepsis (HCC) 10/01/2015  . CAP (community acquired pneumonia) 10/01/2015  . Right foot pain 10/01/2015  . AKI (acute kidney injury) (HCC) 10/01/2015  . Hematemesis 10/01/2015  . Chronic bilateral low back pain without sciatica 09/02/2015  . Chronic ulcer of lower extremity (HCC) 04/08/2015  . Chronic combined systolic and diastolic heart failure (HCC) 04/08/2015  . Type 2 diabetes mellitus (HCC) 01/31/2015  . Anticoagulated, on Xarelto, Chads2Vas score 2 10/02/2013  . History of anticoagulant therapy 10/02/2013  . Atrial fibrillation with  RVR 09/27/2013  . Hypertensive urgency 09/27/2013  . Acute combined systolic and diastolic CHF, NYHA class 4, difficult to assess Diastolic function on echo 09/27/2013  . New onset atrial fibrillation (HCC) 08/22/2012  . 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 Obesity, morbid, BMI 50 or higher (HCC) 07/13/2012  . Back pain, chronic 07/13/2012  . Diabetes mellitus type 2 with complications, uncontrolled (HCC) 04/18/2009  . Essential hypertension 04/18/2009  . OTHER POSTSURGICAL STATUS OTHER 04/18/2009    Orientation RESPIRATION BLADDER Height & Weight     Self, Time, Situation, Place  Normal Continent Weight: (!) 181.4 kg Height:  6\' 2"  (188 cm)  BEHAVIORAL SYMPTOMS/MOOD NEUROLOGICAL BOWEL NUTRITION STATUS      Continent Diet(see dc summary)  AMBULATORY STATUS COMMUNICATION OF NEEDS Skin   Extensive Assist(NWB on left foot, uses walker) Verbally Other (Comment)(diabetic foot ulcer on left foot, has dry dressing with xeroform, guaze and ace wrap daily)                       Personal Care Assistance Level of Assistance  Bathing, Feeding, Dressing Bathing Assistance: Limited assistance Feeding assistance: Independent Dressing Assistance: Limited assistance     Functional Limitations Info  Sight, Hearing, Speech Sight Info: Adequate Hearing Info: Adequate Speech Info: Adequate    SPECIAL CARE FACTORS FREQUENCY  PT (By licensed PT), OT (By licensed OT)     PT Frequency: 5x/week OT Frequency: 5x/week            Contractures Contractures Info: Not present    Additional Factors Info  Code Status, Allergies Code Status Info: Full Allergies Info: No Known Allergies  Current Medications (10/17/2019):  This is the current hospital active medication list Current Facility-Administered Medications  Medication Dose Route Frequency Provider Last Rate Last Admin  . 0.9 %  sodium chloride infusion  250 mL Intravenous PRN Gonfa, Taye T, MD      . Ampicillin-Sulbactam  (UNASYN) 3 g in sodium chloride 0.9 % 100 mL IVPB  3 g Intravenous Q6H Gonfa, Taye T, MD 200 mL/hr at 10/17/19 0425 3 g at 10/17/19 0425  . aspirin EC tablet 81 mg  81 mg Oral Daily Wendee Beavers T, MD   81 mg at 10/17/19 0828  . atorvastatin (LIPITOR) tablet 80 mg  80 mg Oral q1800 Wendee Beavers T, MD   80 mg at 10/16/19 1701  . benzonatate (TESSALON) capsule 200 mg  200 mg Oral TID PRN Wendee Beavers T, MD   200 mg at 10/17/19 0843  . carvedilol (COREG) tablet 6.25 mg  6.25 mg Oral BID WC Gonfa, Taye T, MD   6.25 mg at 10/17/19 0828  . furosemide (LASIX) injection 80 mg  80 mg Intravenous BID Wendee Beavers T, MD   80 mg at 10/17/19 0827  . insulin aspart (novoLOG) injection 0-20 Units  0-20 Units Subcutaneous TID WC Mercy Riding, MD   4 Units at 10/17/19 [phone]  . insulin aspart (novoLOG) injection 0-5 Units  0-5 Units Subcutaneous QHS Mercy Riding, MD   4 Units at 10/15/19 2138  . insulin aspart (novoLOG) injection 6 Units  6 Units Subcutaneous TID WC Mercy Riding, MD   6 Units at 10/17/19 0827  . insulin NPH Human (NOVOLIN N) injection 42 Units  42 Units Subcutaneous BID AC & HS Mercy Riding, MD   42 Units at 10/17/19 0826  . lactated ringers infusion   Intravenous Continuous Mercy Riding, MD 10 mL/hr at 10/15/19 1125 New Bag at 10/15/19 1125  . menthol-cetylpyridinium (CEPACOL) lozenge 3 mg  1 lozenge Oral PRN Mercy Riding, MD   3 mg at 10/11/19 2227  . multivitamin with minerals tablet 1 tablet  1 tablet Oral Daily Mercy Riding, MD   1 tablet at 10/17/19 0828  . ondansetron (ZOFRAN) injection 4 mg  4 mg Intravenous Q6H PRN Gonfa, Taye T, MD      . oxyCODONE-acetaminophen (PERCOCET/ROXICET) 5-325 MG per tablet 1 tablet  1 tablet Oral Q6H PRN Mercy Riding, MD   1 tablet at 10/17/19 0503  . polyethylene glycol (MIRALAX / GLYCOLAX) packet 17 g  17 g Oral BID PRN Wendee Beavers T, MD   17 g at 10/13/19 1248  . protein supplement (ENSURE MAX) liquid  11 oz Oral BID Wendee Beavers T, MD   11 oz at 10/16/19  2120  . rivaroxaban (XARELTO) tablet 20 mg  20 mg Oral Q supper Kris Mouton, RPH   20 mg at 10/16/19 1701  . saccharomyces boulardii (FLORASTOR) capsule 250 mg  250 mg Oral BID Wendee Beavers T, MD   250 mg at 10/17/19 0828  . sacubitril-valsartan (ENTRESTO) 24-26 mg per tablet  1 tablet Oral BID Mercy Riding, MD   1 tablet at 10/17/19 0828  . senna-docusate (Senokot-S) tablet 1 tablet  1 tablet Oral BID PRN Mercy Riding, MD   1 tablet at 10/13/19 1248  . sodium chloride flush (NS) 0.9 % injection 10-40 mL  10-40 mL Intracatheter Q12H Gonfa, Taye T, MD   10 mL at 10/17/19 0830  . sodium chloride flush (NS) 0.9 %  injection 10-40 mL  10-40 mL Intracatheter PRN Gonfa, Taye T, MD      . sodium chloride flush (NS) 0.9 % injection 3 mL  3 mL Intravenous Q12H Candelaria Stagers T, MD   3 mL at 10/16/19 2120  . sodium chloride flush (NS) 0.9 % injection 3 mL  3 mL Intravenous PRN Candelaria Stagers T, MD      . spironolactone (ALDACTONE) tablet 25 mg  25 mg Oral Daily Tonny Bollman, MD   25 mg at 10/17/19 6010  . Vitamin D (Ergocalciferol) (DRISDOL) capsule 50,000 Units  50,000 Units Oral Q7 days Almon Hercules, MD   50,000 Units at 10/16/19 0804     Discharge Medications: Please see discharge summary for a list of discharge medications.  Relevant Imaging Results:  Relevant Lab Results:   Additional Information [phone]  Leone Haven, RN

## 2019-10-17 NOTE — Progress Notes (Signed)
Brief Nutrition Follow-Up Note  Notified by OT Raynelle Fanning) that is requesting diabetes diet education.   RD team has attempted multiple times to educate pt this admission (weekend RD attempted to call pt several times this weekend, but unable to reach). RD attempted to speak with pt x 2; at first attempt pt was receiving personal care. RD tried calling pt room phone at second attempt, but pt did not answer.   "Heart Healthy, Consistent Carbohydrate Nutrition Therapy" handout attached to AVS/ discharge instructions. Per therapy notes, plan to d/c to SNF, where further education can be reinforced. RD will also refer to outpatient diabetes education ([facility]) for further reinforcement after discharge.   RD will assess further education needs at follow-up.   Levada Schilling, RD, LDN, CDCES Registered Dietitian II Certified Diabetes Care and Education Specialist Please refer to [facility] for RD and/or RD on-call/weekend/after hours pager

## 2019-10-17 NOTE — Discharge Instructions (Addendum)
Heart Healthy, Consistent Carbohydrate Nutrition Therapy   A heart-healthy and consistent carbohydrate diet is recommended to manage heart disease and diabetes. To follow a heart-healthy and consistent carbohydrate diet, . Eat a balanced diet with whole grains, fruits and vegetables, and lean protein sources.  . Choose heart-healthy unsaturated fats. Limit saturated fats, trans fats, and cholesterol intake. Eat more plant-based or vegetarian meals using beans and soy foods for protein.  . Eat whole, unprocessed foods to limit the amount of sodium (salt) you eat.  . Choose a consistent amount of carbohydrate at each meal and snack. Limit refined carbohydrates especially sugar, sweets and sugar-sweetened beverages.  . If you drink alcohol, do so in moderation: one serving per day (women) and two servings per day (men). o One serving is equivalent to 12 ounces beer, 5 ounces wine, or 1.5 ounces distilled spirits  Tips Tips for Choosing Heart-Healthy Fats Choose lean protein and low-fat dairy foods to reduce saturated fat intake. . Saturated fat is usually found in animal-based protein and is associated with certain health risks. Saturated fat is the biggest contributor to raise low-density lipoprotein (LDL) cholesterol levels. Research shows that limiting saturated fat lowers unhealthy cholesterol levels. Eat no more than 7% of your total calories each day from saturated fat. Ask your RDN to help you determine how much saturated fat is right for you. . There are many foods that do not contain large amounts of saturated fats. Swapping these foods to replace foods high in saturated fats will help you limit the saturated fat you eat and improve your cholesterol levels. You can also try eating more plant-based or vegetarian meals. Instead of. Try:  Whole milk, cheese, yogurt, and ice cream 1% or skim milk, low-fat cheese, non-fat yogurt, and low-fat ice cream  Fatty, marbled beef and pork Lean beef, pork,  or venison  Poultry with skin Poultry without skin  Butter, stick margarine Reduced-fat, whipped, or liquid spreads  Coconut oil, palm oil Liquid vegetable oils: corn, canola, olive, soybean and safflower oils   Avoid foods that contain trans fats. . Trans fats increase levels of LDL-cholesterol. Hydrogenated fat in processed foods is the main source of trans fats in foods.  . Trans fats can be found in stick margarine, shortening, processed sweets, baked goods, some fried foods, and packaged foods made with hydrogenated oils. Avoid foods with "partially hydrogenated oil" on the ingredient list such as: cookies, pastries, baked goods, biscuits, crackers, microwave popcorn, and frozen dinners. Choose foods with heart healthy fats. . Polyunsaturated and monounsaturated fat are unsaturated fats that may help lower your blood cholesterol level when used in place of saturated fat in your diet. . Ask your RDN about taking a dietary supplement with plant sterols and stanols to help lower your cholesterol level. . Research shows that substituting saturated fats with unsaturated fats is beneficial to cholesterol levels. Try these easy swaps: Instead of. Try:  Butter, stick margarine, or solid shortening Reduced-fat, whipped, or liquid spreads  Beef, pork, or poultry with skin Fish and seafood  Chips, crackers, snack foods Raw or unsalted nuts and seeds or nut butters Hummus with vegetables Avocado on toast  Coconut oil, palm oil Liquid vegetable oils: corn, canola, olive, soybean and safflower oils  Limit the amount of cholesterol you eat to less than 200 milligrams per day. . Cholesterol is a substance carried through the bloodstream via lipoproteins, which are known as "transporters" of fat. Some body functions need cholesterol to work properly, but too much   cholesterol in the bloodstream can damage arteries and build up blood vessel linings (which can lead to heart attack and stroke). You should eat  less than 200 milligrams cholesterol per day. . People respond differently to eating cholesterol. There is no test available right now that can figure out which people will respond more to dietary cholesterol and which will respond less. For individuals with high intake of dietary cholesterol, different types of increase (none, small, moderate, large) in LDL-cholesterol levels are all possible.  . Food sources of cholesterol include egg yolks and organ meats such as liver, gizzards. Limit egg yolks to two to four per week and avoid organ meats like liver and gizzards to control cholesterol intake. Tips for Choosing Heart-Healthy Carbohydrates Consume a consistent amount of carbohydrate . It is important to eat foods with carbohydrates in moderation because they impact your blood glucose level. Carbohydrates can be found in many foods such as: . Grains (breads, crackers, rice, pasta, and cereals)  . Starchy Vegetables (potatoes, corn, and peas)  . Beans and legumes  . Milk, soy milk, and yogurt  . Fruit and fruit juice  . Sweets (cakes, cookies, ice cream, jam and jelly) . Your RDN will help you set a goal for how many carbohydrate servings to eat at your meals and snacks. For many adults, eating 3 to 5 servings of carbohydrate foods at each meal and 1 or 2 carbohydrate servings for each snack works well.  . Check your blood glucose level regularly. It can tell you if you need to adjust when you eat carbohydrates. . Choose foods rich in viscous (soluble) fiber . Viscous, or soluble, is found in the walls of plant cells. Viscous fiber is found only in plant-based foods. Eating foods with fiber helps to lower your unhealthy cholesterol and keep your blood glucose in range  . Rich sources of viscous fiber include vegetables (asparagus, Brussels sprouts, sweet potatoes, turnips) fruit (apricots, mangoes, oranges), legumes, and whole grains (barley, oats, and oat bran).  . As you increase your fiber  intake gradually, also increase the amount of water you drink. This will help prevent constipation.  . If you have difficulty achieving this goal, ask your RDN about fiber laxatives. Choose fiber supplements made with viscous fibers such as psyllium seed husks or methylcellulose to help lower unhealthy cholesterol.  . Limit refined carbohydrates  . There are three types of carbohydrates: starches, sugar, and fiber. Some carbohydrates occur naturally in food, like the starches in rice or corn or the sugars in fruits and milk. Refined carbohydrates--foods with high amounts of simple sugars--can raise triglyceride levels. High triglyceride levels are associated with coronary heart disease. . Some examples of refined carbohydrate foods are table sugar, sweets, and beverages sweetened with added sugar. Tips for Reducing Sodium (Salt) Although sodium is important for your body to function, too much sodium can be harmful for people with high blood pressure. As sodium and fluid buildup in your tissues and bloodstream, your blood pressure increases. High blood pressure may cause damage to other organs and increase your risk for a stroke. Even if you take a pill for blood pressure or a water pill (diuretic) to remove fluid, it is still important to have less salt in your diet. Ask your doctor and RDN what amount of sodium is right for you. . Avoid processed foods. Eat more fresh foods.  . Fresh fruits and vegetables are naturally low in sodium, as well as frozen vegetables and fruits that have   no added juices or sauces.  . Fresh meats are lower in sodium than processed meats, such as bacon, sausage, and hotdogs. Read the nutrition label or ask your butcher to help you find a fresh meat that is low in sodium. . Eat less salt--at the table and when cooking.  . A single teaspoon of table salt has 2,300 mg of sodium.  . Leave the salt out of recipes for pasta, casseroles, and soups.  . Ask your RDN how to cook your  favorite recipes without sodium . Be a smart shopper.  . Look for food packages that say "salt-free" or "sodium-free." These items contain less than 5 milligrams of sodium per serving.  . "Very low-sodium" products contain less than 35 milligrams of sodium per serving.  . "Low-sodium" products contain less than 140 milligrams of sodium per serving.  . Beware for "Unsalted" or "No Added Salt" products. These items may still be high in sodium. Check the nutrition label. . Add flavors to your food without adding sodium.  . Try lemon juice, lime juice, fruit juice or vinegar.  . Dry or fresh herbs add flavor. Try basil, bay leaf, dill, rosemary, parsley, sage, dry mustard, nutmeg, thyme, and paprika.  . Pepper, red pepper flakes, and cayenne pepper can add spice t your meals without adding sodium. Hot sauce contains sodium, but if you use just a drop or two, it will not add up to much.  . Buy a sodium-free seasoning blend or make your own at home. Additional Lifestyle Tips Achieve and maintain a healthy weight. . Talk with your RDN or your doctor about what is a healthy weight for you. . Set goals to reach and maintain that weight.  . To lose weight, reduce your calorie intake along with increasing your physical activity. A weight loss of 10 to 15 pounds could reduce LDL-cholesterol by 5 milligrams per deciliter. Participate in physical activity. . Talk with your health care team to find out what types of physical activity are best for you. Set a plan to get about 30 minutes of exercise on most days.  Foods Recommended Food Group Foods Recommended  Grains Whole grain breads and cereals, including whole wheat, barley, rye, buckwheat, corn, teff, quinoa, millet, amaranth, brown or wild rice, sorghum, and oats Pasta, especially whole wheat or other whole grain types  Brown rice, quinoa or wild rice Whole grain crackers, bread, rolls, pitas Home-made bread with reduced-sodium baking soda  Protein  Foods Lean cuts of beef and pork (loin, leg, round, extra lean hamburger)  Skinless poultry Fish Venison and other wild game Dried beans and peas Nuts and nut butters Meat alternatives made with soy or textured vegetable protein  Egg whites or egg substitute Cold cuts made with lean meat or soy protein  Dairy Nonfat (skim), low-fat, or 1%-fat milk  Nonfat or low-fat yogurt or cottage cheese Fat-free and low-fat cheese  Vegetables Fresh, frozen, or canned vegetables without added fat or salt   Fruits Fresh, frozen, canned, or dried fruit   Oils Unsaturated oils (corn, olive, peanut, soy, sunflower, canola)  Soft or liquid margarines and vegetable oil spreads  Salad dressings Seeds and nuts  Avocado   Foods Not Recommended Food Group Foods Not Recommended  Grains Breads or crackers topped with salt Cereals (hot or cold) with more than 300 mg sodium per serving Biscuits, cornbread, and other "quick" breads prepared with baking soda Bread crumbs or stuffing mix from a store High-fat bakery products, such as   doughnuts, biscuits, croissants, danish pastries, pies, cookies Instant cooking foods to which you add hot water and stir--potatoes, noodles, rice, etc. Packaged starchy foods--seasoned noodle or rice dishes, stuffing mix, macaroni and cheese dinner Snacks made with partially hydrogenated oils, including chips, cheese puffs, snack mixes, regular crackers, butter-flavored popcorn  Protein Foods Higher-fat cuts of meats (ribs, t-bone steak, regular hamburger) Bacon, sausage, or hot dogs Cold cuts, such as salami or bologna, deli meats, cured meats, corned beef Organ meats (liver, brains, gizzards, sweetbreads) Poultry with skin Fried or smoked meat, poultry, and fish Whole eggs and egg yolks (more than 2-4 per week) Salted legumes, nuts, seeds, or nut/seed butters Meat alternatives with high levels of sodium (>300 mg per serving) or saturated fat (>5 g per serving)  Dairy Whole  milk,?2% fat milk, buttermilk Whole milk yogurt or ice cream Cream Half-&-half Cream cheese Sour cream Cheese  Vegetables Canned or frozen vegetables with salt, fresh vegetables prepared with salt, butter, cheese, or cream sauce Fried vegetables Pickled vegetables such as olives, pickles, or sauerkraut  Fruits Fried fruits Fruits served with butter or cream  Oils Butter, stick margarine, shortening Partially hydrogenated oils or trans fats Tropical oils (coconut, palm, palm kernel oils)  Other Candy, sugar sweetened soft drinks and desserts Salt, sea salt, garlic salt, and seasoning mixes containing salt Bouillon cubes Ketchup, barbecue sauce, Worcestershire sauce, soy sauce, teriyaki sauce Miso Salsa Pickles, olives, relish   Heart Healthy Consistent Carbohydrate Vegetarian (Lacto-Ovo) Sample 1-Day Menu  Breakfast 1 cup oatmeal, cooked (2 carbohydrate servings)   cup blueberries (1 carbohydrate serving)  11 almonds, without salt  1 cup 1% milk (1 carbohydrate serving)  1 cup coffee  Morning Snack 1 cup fat-free plain yogurt (1 carbohydrate serving)  Lunch 1 whole wheat bun (1 carbohydrate servings)  1 black bean burger (1 carbohydrate servings)  1 slice cheddar cheese, low sodium  2 slices tomatoes  2 leaves lettuce  1 teaspoon mustard  1 small pear (1 carbohydrate servings)  1 cup green tea, unsweetened  Afternoon Snack 1/3 cup trail mix with nuts, seeds, and raisins, without salt (1 carbohydrate servinga)  Evening Meal  cup meatless chicken  2/3 cup brown rice, cooked (2 carbohydrate servings)  1 cup broccoli, cooked (2/3 carbohydrate serving)   cup carrots, cooked (1/3 carbohydrate serving)  2 teaspoons olive oil  1 teaspoon balsamic vinegar  1 whole wheat dinner roll (1 carbohydrate serving)  1 teaspoon margarine, soft, tub  1 cup 1% milk (1 carbohydrate serving)  Evening Snack 1 extra small banana (1 carbohydrate serving)  1 tablespoon peanut butter    Heart Healthy Consistent Carbohydrate Vegan Sample 1-Day Menu  Breakfast 1 cup oatmeal, cooked (2 carbohydrate servings)   cup blueberries (1 carbohydrate serving)  11 almonds, without salt  1 cup soymilk fortified with calcium, vitamin B12, and vitamin D  1 cup coffee  Morning Snack 6 ounces soy yogurt (1 carbohydrate servings)  Lunch 1 whole wheat bun(1 carbohydrate servings)  1 black bean burger (1 carbohydrate serving)  2 slices tomatoes  2 leaves lettuce  1 teaspoon mustard  1 small pear (1 carbohydrate servings)  1 cup green tea, unsweetened  Afternoon Snack 1/3 cup trail mix with nuts, seeds, and raisins, without salt (1 carbohydrate servings)  Evening Meal  cup meatless chicken  2/3 cup brown rice, cooked (2 carbohydrate servings)  1 cup broccoli, cooked (2/3 carbohydrate serving)   cup carrots, cooked (1/3 carbohydrate serving)  2 teaspoons olive oil  1   teaspoon balsamic vinegar  1 whole wheat dinner roll (1 carbohydrate serving)  1 teaspoon margarine, soft, tub  1 cup soymilk fortified with calcium, vitamin B12, and vitamin D  Evening Snack 1 extra small banana (1 carbohydrate serving)  1 tablespoon peanut butter    Heart Healthy Consistent Carbohydrate Sample 1-Day Menu  Breakfast 1 cup cooked oatmeal (2 carbohydrate servings)  3/4 cup blueberries (1 carbohydrate serving)  1 ounce almonds  1 cup skim milk (1 carbohydrate serving)  1 cup coffee  Morning Snack 1 cup sugar-free nonfat yogurt (1 carbohydrate serving)  Lunch 2 slices whole-wheat bread (2 carbohydrate servings)  2 ounces lean turkey breast  1 ounce low-fat Swiss cheese  1 teaspoon mustard  1 slice tomato  1 lettuce leaf  1 small pear (1 carbohydrate serving)  1 cup skim milk (1 carbohydrate serving)  Afternoon Snack 1 ounce trail mix with unsalted nuts, seeds, and raisins (1 carbohydrate serving)  Evening Meal 3 ounces salmon  2/3 cup cooked brown rice (2 carbohydrate servings)  1 teaspoon  soft margarine  1 cup cooked broccoli with 1/2 cup cooked carrots (1 carbohydrate serving  Carrots, cooked, boiled, drained, without salt  1 cup lettuce  1 teaspoon olive oil with vinegar for dressing  1 small whole grain roll (1 carbohydrate serving)  1 teaspoon soft margarine  1 cup unsweetened tea  Evening Snack 1 extra-small banana (1 carbohydrate serving)  Copyright 2020  [employer]. All rights reserved.   Information on my medicine - XARELTO (Rivaroxaban)  Why was Xarelto prescribed for you? Xarelto was prescribed for you to reduce the risk of a blood clot forming that can cause a stroke if you have a medical condition called atrial fibrillation (a type of irregular heartbeat).  What do you need to know about xarelto ? Take your Xarelto ONCE DAILY at the same time every day with your evening meal. If you have difficulty swallowing the tablet whole, you may crush it and mix in applesauce just prior to taking your dose.  Take Xarelto exactly as prescribed by your doctor and DO NOT stop taking Xarelto without talking to the doctor who prescribed the medication.  Stopping without other stroke prevention medication to take the place of Xarelto may increase your risk of developing a clot that causes a stroke.  Refill your prescription before you run out.  After discharge, you should have regular check-up appointments with your healthcare provider that is prescribing your Xarelto.  In the future your dose may need to be changed if your kidney function or weight changes by a significant amount.  What do you do if you miss a dose? If you are taking Xarelto ONCE DAILY and you miss a dose, take it as soon as you remember on the same day then continue your regularly scheduled once daily regimen the next day. Do not take two doses of Xarelto at the same time or on the same day.   Important Safety Information A possible side effect of Xarelto is bleeding. You  should call your healthcare provider right away if you experience any of the following: ? Bleeding from an injury or your nose that does not stop. ? Unusual colored urine (red or dark brown) or unusual colored stools (red or black). ? Unusual bruising for unknown reasons. ? A serious fall or if you hit your head (even if there is no bleeding).  Some medicines may interact with Xarelto and might increase your risk of   bleeding while on Xarelto. To help avoid this, consult your healthcare provider or pharmacist prior to using any new prescription or non-prescription medications, including herbals, vitamins, non-steroidal anti-inflammatory drugs (NSAIDs) and supplements.  This website has more information on Xarelto: [email or web address].[email or web address].   

## 2019-10-17 NOTE — Progress Notes (Signed)
CCMD notified of Vtach. Nurse at bedside. Pt was pushing self up in bed at this time. No distress noted.

## 2019-10-17 NOTE — Progress Notes (Signed)
Subjective: POD #2 return to the OR s/p left foot wound debridement, incision and drainage, graft application to the heel.  No pain to the foot.  He is still complaining of pain to the right groin.  No other concerns today.  Denies any fevers, chills, nausea, vomiting.  No calf pain, chest pain or shortness of breath.  Objective: AAO x3, NAD Status post debridement of ulcerations x2 on the plantar foot. It appears that the graft of the heel is taking.  Granular tissue to the midfoot ulceration with micomatrix intact.  There is no purulence identified only bloody drainage on the bandage.  There is no warmth of the foot and no significant swelling there is no erythema.  There is no area fluctuance or crepitation.  No obvious signs of infection noted. Healed ulceration on the right anterior ankle without any surrounding erythema, ascending cellulitis.  No drainage or pus.  No pain with calf compression, warmth, erythema.             Assessment: POD # 2 s/p return to the OR for left lower extremity wound debridement/ I&D/ heel wound graft  Plan: WBC elevated normalized today and is afebrile.  No obvious signs of infection to the lower extremities.  Today I did change the bandage.  Which time jelly, Adaptic applied to the heel wound followed by dry sterile dressing to be other ulceration in heel.  Recommend continue daily dressing changes.  Continue to recommend nonweightbearing and he will likely be discharged to skilled nursing facility.  Still having pain in the right groin.  Per primary team.  Ovid Curd, DPM O: [phone] C: [phone]

## 2019-10-17 NOTE — Progress Notes (Signed)
Pt is having short periods of apnea while asleep but is refusing to use CPAP. O2 sat maintaining above 92% on RA at this time.

## 2019-10-17 NOTE — Progress Notes (Signed)
Subjective: POD #1 return to the OR s/p left foot wound debridement, incision and drainage, graft application to the heel. No pain to the foot. He reports some pain to the right groin and when he coughs it hurts. No chest pain, SOB, leg pain. No fevers, chills, nausea, vomiting. Reports leg swelling much improved.   Objective: AAO x3, NAD Status post debridement of ulcerations x2 on the plantar foot. I did remove the dressing due to WBC. There is blood drainage on the bandage. There is no drainage noted from the ulcerations. No purulence. No fluctuation, crepitation. The area where he has some discomfort previously has resolved. I am not able to appreciate any fluid collection. Graft intact to the heel. The wound on the lateral/central midfoot with granulation tissue and Acell micromatrix present. There is some darkened discoloration/burising around the distal plantar foot. There is no drainage to this area. There was some tunneling in this area during the surgery. The femoral artery access site is soft without any hematoma.  No pain with calf compression, warmth, erythema.       Assessment: POD # 1 s/p return to the OR for left lower extremity wound debridement/ I&D/ heel wound graft  Plan: WBC elevated today but afebrile- likely a response to surgery. I did remove the bandage today and not able to appreciate any infection. Repeat wound culture (from the midfoot ulcer) growing rare proteus as well. Continue antibiotics. I continue to recommend NWB. I do not think a wedge shoe will be helpful. In order to help get these wounds to heal I recommend NWB. I do think rehab would be beneficial for him but will await PT assessment on this. From my standpoint as long as WBC decreases and remains afebrile he can be discharged with close follow up. He understands he is at high risk of limb loss but will continue aggressive local wound care, antibiotics.   Monitor the right groin site. If no improvement  consider ultrasound?  Ovid Curd, DPM O: [phone] C: [phone]

## 2019-10-17 NOTE — Progress Notes (Addendum)
Progress Note  Patient Name: Maurice Nguyen Date of Encounter: 10/17/2019  Primary Cardiologist: Kristeen Miss, MD   Subjective   C/o right lower quad abd pain. Otherwise doing well.   Inpatient Medications    Scheduled Meds: . aspirin EC  81 mg Oral Daily  . atorvastatin  80 mg Oral q1800  . carvedilol  6.25 mg Oral BID WC  . furosemide  80 mg Intravenous BID  . insulin aspart  0-20 Units Subcutaneous TID WC  . insulin aspart  0-5 Units Subcutaneous QHS  . insulin aspart  6 Units Subcutaneous TID WC  . insulin NPH Human  42 Units Subcutaneous BID AC & HS  . multivitamin with minerals  1 tablet Oral Daily  . Ensure Max Protein  11 oz Oral BID  . rivaroxaban  20 mg Oral Q supper  . saccharomyces boulardii  250 mg Oral BID  . sacubitril-valsartan  1 tablet Oral BID  . sodium chloride flush  10-40 mL Intracatheter Q12H  . sodium chloride flush  3 mL Intravenous Q12H  . spironolactone  25 mg Oral Daily  . Vitamin D (Ergocalciferol)  50,000 Units Oral Q7 days   Continuous Infusions: . sodium chloride    . ampicillin-sulbactam (UNASYN) IV 3 g (10/17/19 0425)  . lactated ringers 10 mL/hr at 10/15/19 1125   PRN Meds: sodium chloride, benzonatate, menthol-cetylpyridinium, ondansetron (ZOFRAN) IV, oxyCODONE-acetaminophen, polyethylene glycol, senna-docusate, sodium chloride flush, sodium chloride flush   Vital Signs    Vitals:   10/16/19 1701 10/16/19 2035 10/16/19 2100 10/17/19 0610  BP: 122/88 101/88  (!) 144/105  Pulse: 85 85  87  Resp: 16 17  20   Temp:  98.8 F (37.1 C)  98.6 F (37 C)  TempSrc:  Oral  Oral  SpO2: 97% 93% 92% 97%  Weight:    (!) 181.4 kg  Height:        Intake/Output Summary (Last 24 hours) at 10/17/2019 1039 Last data filed at 10/17/2019 0600 Gross per 24 hour  Intake 1600 ml  Output 3775 ml  Net -2175 ml   Last 3 Weights 10/17/2019 10/16/2019 10/15/2019  Weight (lbs) 400 lb 414 lb 11 oz 410 lb 7.9 oz  Weight (kg) 181.439 kg 188.1 kg  186.2 kg      Telemetry    Afib rate controlled - Personally Reviewed  ECG    No new tracing.   Physical Exam  Obese male, sitting up in bed.  GEN: No acute distress.   Neck: No JVD Cardiac: Irreg Irreg, no murmurs, rubs, or gallops.  Respiratory: Clear to auscultation bilaterally. GI: Soft, nontender, non-distended  MS: No edema; No deformity. Neuro:  Nonfocal  Psych: Normal affect   Labs    High Sensitivity Troponin:  No results for input(s): TROPONINIHS in the last 720 hours.    Chemistry Recent Labs  Lab 10/13/19 0640 10/13/19 0640 10/14/19 0406 10/14/19 0406 10/15/19 0534 10/16/19 0407 10/17/19 0502  NA 136   < > 137   < > 139 134* 136  K 4.1   < > 4.3   < > 3.7 4.1 4.0  CL 102   < > 99   < > 100 99 99  CO2 25   < > 28   < > 27 24 26   GLUCOSE 268*   < > 157*   < > 160* 253* 216*  BUN 25*   < > 24*   < > 28* 30* 26*  CREATININE 1.16   < >  1.07   < > 1.16 1.29* 1.14  CALCIUM 8.2*   < > 8.5*   < > 8.8* 8.7* 8.7*  ALBUMIN 2.6*  --  2.6*  --  2.7*  --   --   GFRNONAA >60   < > >60   < > >60 >60 >60  GFRAA >60   < > >60   < > >60 >60 >60  ANIONGAP 9   < > 10   < > 12 11 11    < > = values in this interval not displayed.     Hematology Recent Labs  Lab 10/15/19 0534 10/16/19 0407 10/17/19 0502  WBC 10.2 12.4* 10.3  RBC 4.32 4.25 4.29  HGB 13.2 13.2 13.2  HCT 41.5 40.8 41.4  MCV 96.1 96.0 96.5  MCH 30.6 31.1 30.8  MCHC 31.8 32.4 31.9  RDW 13.5 13.4 13.8  PLT 352 345 354    BNPNo results for input(s): BNP, PROBNP in the last 168 hours.   DDimer No results for input(s): DDIMER in the last 168 hours.   Radiology    No results found.  Cardiac Studies   Echocardiogram 10/09/2019: IMPRESSIONS    1. Left ventricular ejection fraction, by estimation, is 30 to 35%. The  left ventricle has moderately decreased function. The left ventricle  demonstrates global hypokinesis. The left ventricular internal cavity size  was mildly dilated. Left  ventricular  diastolic parameters are indeterminate.  2. Right ventricular systolic function was not well visualized. The right  ventricular size is not well visualized.  3. Left atrial size was moderately dilated.  4. The mitral valve is abnormal. Trivial mitral valve regurgitation.  5. The aortic valve is tricuspid. Aortic valve regurgitation is not  visualized.  6. Aortic dilatation noted. There is mild dilatation of the ascending  aorta measuring 39 mm.  7. The inferior vena cava is dilated in size with <50% respiratory  variability, suggesting right atrial pressure of 15 mmHg.   Patient Profile     54 y.o. 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 Maylene Roes.  Assessment & Plan    1.Acute on chronic combined heart failure: Patientself discontinueddiuretic 2 weeks agoand presented withworsening LLEandSOB>>>improved with IV Lasix. Weight is down and he is net - 21L. Will transition to lasix PO 80mg  BID today.  -Echo this admission showed EF 30-35%. Echo in 2016 showed EF 30-35%and it appears patient never had ischemic evaluation at that time. -- Tolerating BB, Entresto, spiro.   2.Cardiomyopathy: -EF 30-35%per echocardiogramthis admission -Coronaries assessed during vascular procedure 10/11/2019 found to have moderate to severe stenosis and a small PDA branch of the RCA not felt to be contributing to cardiomyopathy -Continue medical management of CAD and cardiomyopathyas above  -on ASA, BB, statin  3.Nonhealing Diabetic left foot wound: Podiatrywithdebridement with Dr. [name] x2. There was concern forstenosis on recent duplextherefore he underwentangiography withnointervention -On abx per IM  4.Paroxysmal Afib, now Afib RVR: On admission noted  to be in Afib RVR and started on IVcardizem and IV heparin. CHADSVASC= 4(HTN, CHF, DM, PVD). Continue on BB and transitioned to [place] with Xarelto. Complex situation with his BMI and DOAC, but suspect he would not be a good candidate for coumadin with hx of noncompliance.  -- will further increase coreg to 12.5mg  BID  5.HTN: Stable  6.DM2: -  SSI per IM  7.HLD: LDL, 65 -Transitioned to atorvastatin high intensity given moderate CAD  8.AKI -Creatinine, 1.16 todayimproving with diuresis -Watch with daily BMET  For questions or updates, please contact [facility] Please consult [email or web address] for contact info under   Signed, Laverda Page, NP  10/17/2019, 10:39 AM    I have personally seen and examined this patient. I agree with the assessment and plan as outlined above.  He is feeling better. Volume status is much better. He is negative 21 liters. Will change to po Lasix today.  He is on appropriate medical therapy. We will sign off for now. We will arrange f/u with Dr. Elease Hashimoto. Please call with questions.   Verne Carrow 10/17/2019 12:39 PM

## 2019-10-17 NOTE — Plan of Care (Signed)
  Problem: Education: Goal: Knowledge of General Education information will improve Description: Including pain rating scale, medication(s)/side effects and non-pharmacologic comfort measures Outcome: Progressing   Problem: Health Behavior/Discharge Planning: Goal: Ability to manage health-related needs will improve Outcome: Progressing   Problem: Clinical Measurements: Goal: Ability to maintain clinical measurements within normal limits will improve Outcome: Progressing Goal: Will remain free from infection Outcome: Progressing Goal: Diagnostic test results will improve Outcome: Progressing Goal: Respiratory complications will improve Outcome: Progressing Goal: Cardiovascular complication will be avoided Outcome: Progressing   Problem: Activity: Goal: Risk for activity intolerance will decrease Outcome: Progressing   Problem: Coping: Goal: Level of anxiety will decrease Outcome: Progressing   Problem: Pain Managment: Goal: General experience of comfort will improve Outcome: Progressing   Problem: Education: Goal: Ability to demonstrate management of disease process will improve Outcome: Progressing Goal: Ability to verbalize understanding of medication therapies will improve Outcome: Progressing   Problem: Activity: Goal: Capacity to carry out activities will improve Outcome: Progressing   Problem: Cardiac: Goal: Ability to achieve and maintain adequate cardiopulmonary perfusion will improve Outcome: Progressing   

## 2019-10-18 ENCOUNTER — Telehealth: Payer: Self-pay | Admitting: Podiatry

## 2019-10-18 DIAGNOSIS — S301XXA Contusion of abdominal wall, initial encounter: Secondary | ICD-10-CM

## 2019-10-18 DIAGNOSIS — I482 Chronic atrial fibrillation, unspecified: Secondary | ICD-10-CM

## 2019-10-18 DIAGNOSIS — N289 Disorder of kidney and ureter, unspecified: Secondary | ICD-10-CM

## 2019-10-18 LAB — BASIC METABOLIC PANEL
Anion gap: 11 (ref 5–15)
BUN: 32 mg/dL — ABNORMAL HIGH (ref 6–20)
CO2: 27 mmol/L (ref 22–32)
Calcium: 8.9 mg/dL (ref 8.9–10.3)
Chloride: 100 mmol/L (ref 98–111)
Creatinine, Ser: 1.16 mg/dL (ref 0.61–1.24)
GFR calc Af Amer: >60 mL/min (ref >60)
GFR calc non Af Amer: >60 mL/min (ref >60)
Glucose, Bld: 86 mg/dL (ref 70–99)
Potassium: 3.9 mmol/L (ref 3.5–5.1)
Sodium: 138 mmol/L (ref 135–145)

## 2019-10-18 LAB — GLUCOSE, CAPILLARY
Glucose-Capillary: 104 mg/dL — ABNORMAL HIGH (ref 70–99)
Glucose-Capillary: 92 mg/dL (ref 70–99)
Glucose-Capillary: 104 mg/dL — ABNORMAL HIGH (ref 70–99)
Glucose-Capillary: 183 mg/dL — ABNORMAL HIGH (ref 70–99)

## 2019-10-18 LAB — CBC
HCT: 41.8 % (ref 39.0–52.0)
Hemoglobin: 13.4 g/dL (ref 13.0–17.0)
MCH: 31.3 pg (ref 26.0–34.0)
MCHC: 32.1 g/dL (ref 30.0–36.0)
MCV: 97.7 fL (ref 80.0–100.0)
Platelets: 357 10*3/uL (ref 150–400)
RBC: 4.28 MIL/uL (ref 4.22–5.81)
RDW: 13.8 % (ref 11.5–15.5)
WBC: 10.8 10*3/uL — ABNORMAL HIGH (ref 4.0–10.5)
nRBC: 0.2 % (ref 0.0–0.2)

## 2019-10-18 LAB — MAGNESIUM: Magnesium: 1.9 mg/dL (ref 1.7–2.4)

## 2019-10-18 MED ORDER — SACUBITRIL-VALSARTAN 24-26 MG PO TABS: 1.0000 | ORAL_TABLET | Freq: Two times a day (BID) | ORAL | Status: AC

## 2019-10-18 MED ORDER — ENSURE MAX PROTEIN PO LIQD: 11.0000 [oz_av] | Freq: Two times a day (BID) | ORAL | Status: DC

## 2019-10-18 MED ORDER — SPIRONOLACTONE 25 MG PO TABS: 25.0000 mg | ORAL_TABLET | Freq: Every day | ORAL | Status: DC

## 2019-10-18 MED ORDER — ADULT MULTIVITAMIN W/MINERALS CH: 1.0000 | ORAL_TABLET | Freq: Every day | ORAL | Status: AC

## 2019-10-18 MED ORDER — CARVEDILOL 12.5 MG PO TABS: 12.5000 mg | ORAL_TABLET | Freq: Two times a day (BID) | ORAL | Status: AC

## 2019-10-18 MED ORDER — AMOXICILLIN-POT CLAVULANATE 875-125 MG PO TABS: 1.0000 | ORAL_TABLET | Freq: Two times a day (BID) | ORAL | 0 refills | Status: AC

## 2019-10-18 MED ORDER — GABAPENTIN 300 MG PO CAPS
300.0000 mg | ORAL_CAPSULE | Freq: Two times a day (BID) | ORAL | Status: DC
  Administered 2019-10-18 – 2019-10-19 (×3): 300 mg via ORAL
  Filled 2019-10-18 (×3): qty 1

## 2019-10-18 MED ORDER — VITAMIN D (ERGOCALCIFEROL) 1.25 MG (50000 UNIT) PO CAPS: 50000.0000 [IU] | ORAL_CAPSULE | ORAL | Status: AC

## 2019-10-18 MED ORDER — SACCHAROMYCES BOULARDII 250 MG PO CAPS: 250.0000 mg | ORAL_CAPSULE | Freq: Two times a day (BID) | ORAL | Status: AC

## 2019-10-18 MED ORDER — FUROSEMIDE 80 MG PO TABS: 80.0000 mg | ORAL_TABLET | Freq: Two times a day (BID) | ORAL | Status: AC

## 2019-10-18 MED ORDER — INSULIN ASPART 100 UNIT/ML ~~LOC~~ SOLN: 0.0000 [IU] | Freq: Three times a day (TID) | SUBCUTANEOUS | 11 refills | Status: AC

## 2019-10-18 MED ORDER — POLYETHYLENE GLYCOL 3350 17 G PO PACK: 17.0000 g | PACK | Freq: Two times a day (BID) | ORAL | 0 refills | Status: AC | PRN

## 2019-10-18 MED ORDER — JARDIANCE 10 MG PO TABS: 10.0000 mg | ORAL_TABLET | Freq: Every day | ORAL | 1 refills | Status: DC

## 2019-10-18 MED ORDER — RIVAROXABAN 20 MG PO TABS: 20.0000 mg | ORAL_TABLET | Freq: Every day | ORAL | Status: AC

## 2019-10-18 MED ORDER — ATORVASTATIN CALCIUM 80 MG PO TABS: 80.0000 mg | ORAL_TABLET | Freq: Every day | ORAL | Status: AC

## 2019-10-18 MED ORDER — BISACODYL 5 MG PO TBEC
5.0000 mg | DELAYED_RELEASE_TABLET | Freq: Every day | ORAL | Status: DC | PRN
  Administered 2019-10-18 – 2019-10-19 (×2): 5 mg via ORAL
  Filled 2019-10-18 (×3): qty 1

## 2019-10-18 MED ORDER — METFORMIN HCL ER 500 MG PO TB24: 1000.0000 mg | ORAL_TABLET | Freq: Two times a day (BID) | ORAL | Status: DC

## 2019-10-18 MED ORDER — INSULIN NPH (HUMAN) (ISOPHANE) 100 UNIT/ML ~~LOC~~ SUSP: 35.0000 [IU] | Freq: Two times a day (BID) | SUBCUTANEOUS | 11 refills | Status: AC

## 2019-10-18 MED ORDER — GABAPENTIN 300 MG PO CAPS: 300.0000 mg | ORAL_CAPSULE | Freq: Two times a day (BID) | ORAL | 2 refills | Status: AC

## 2019-10-18 MED ORDER — ACETAMINOPHEN 500 MG PO TABS: 1000.0000 mg | ORAL_TABLET | Freq: Three times a day (TID) | ORAL | 1 refills | Status: AC

## 2019-10-18 MED ORDER — BENZONATATE 200 MG PO CAPS: 200.0000 mg | ORAL_CAPSULE | Freq: Three times a day (TID) | ORAL | 0 refills | Status: DC | PRN

## 2019-10-18 NOTE — Progress Notes (Signed)
Paged MD on call to notify CT abd/pelvis results.

## 2019-10-18 NOTE — TOC Transition Note (Addendum)
Transition of Care [facility]) - CM/SW Discharge Note   Patient Details  Name: Maurice Nguyen MRN: [id number] Date of Birth: 12/30/1965  [facility] CM/SW Contact:  Terrial Rhodes, LCSWA Phone Number: 10/18/2019, 1:13 PM   Clinical Narrative:     Patient will DC to: [facility]  Anticipated DC date: 10/19/2019  Family notified: [name]   Transport by: Sharin Mons  ?  Per MD patient ready for DC to [facility] . RN, patient, patient's family, and facility notified of DC. Discharge Summary sent to facility. RN given number for report tele# [phone]. DC packet on chart. Ambulance transport requested for patient.  CSW signing off.    Final next level of care: Skilled Nursing Facility Barriers to Discharge: No Barriers Identified   Patient Goals and CMS Choice Patient states their goals for this hospitalization and ongoing recovery are:: to go to skilled nursing facility CMS Medicare.gov Compare Post Acute Care list provided to:: Patient Choice offered to / list presented to : Patient  Discharge Placement              Patient chooses bed at: [facility] Patient to be transferred to facility by: [facility] Name of family member notified: [name] [phone] Patient and family notified of of transfer: 10/18/19  Discharge Plan and Services                                     Social Determinants of Health (SDOH) Interventions     Readmission Risk Interventions No flowsheet data found.

## 2019-10-18 NOTE — Discharge Summary (Signed)
Physician Discharge Summary  Maurice Nguyen ASN:[id number] DOB: 1965/11/29 DOA: 10/08/2019  PCP: Bernerd Limbo, MD  Admit date: 10/08/2019 Discharge date: 10/18/2019  Admitted From: Home. Disposition: SNF  Recommendations for Outpatient Follow-up:  1. Follow ups as below. 2. Please obtain CBC/BMP/Mag in 3 days 3. Please follow up on the following pending results: None  Discharge Condition: Stable CODE STATUS: Full code   Contact information for follow-up providers    [name], PA-C Follow up on 10/30/2019.   Specialties: Cardiology, Physician Assistant Why: at 11:15am for your follow up appt.  Contact information: [address] [address] [phone]            Contact information for after-discharge care    Destination    [facility] Preferred SNF .   Service: Skilled Nursing Contact information: [address] Pricilla Handler [place] [facility] Course: 54 yo AA male with history of uncontrolled DM-2 with left foot wound, HTN, systolic HF EF 26-83%, paroxysmal A fib and debility presenting with worsening left foot infection and worsening BLE edema.  He has been followed by Dr. [name] of Podiatry, was scheduled also to consult with vascular surgery Dr. [name] on 3/18.  Patient stopped his diuretics for about 2 weeks as he was unable to get to the bathroom.   CT left foot did not reveal focal fluid collection or abscess, no CT findings of osteo.  He was also found to be in A. fib with RVR and started on Cardizem drip and IV Lasix.   Patient was admitted for diabetic left foot wound in the setting of possible PAD, combined CHF exacerbation and A. fib with RVR.  Podiatry, vascular surgery and cardiology following.  Patient underwent debridement of left diabetic foot ulcer by podiatry on 3/17 and 3/22.  He underwent LLE angiogram on 3/18 that revealed occlusion of  posterior tibial artery in mid calf with reconstitution via peroneal which is the main runoff to the foot but no flow obstructing stenosis proximally. He was on vancomycin and Zosyn and transitioned to Unasyn and then to Augmentin prior to discharge.  Tissue culture grew Proteus mirabilis.  Discharged on Augmentin for 10 more days.  He will be on probiotics as well.  Patient will be nonweightbearing on her left leg.  Podiatry recommended daily dressing changes.  Close follow-up with podiatry in 1 week.  In regards to his CHF, echo with EF of 30 to 35% (stable).  He is followed by cardiology.  He was diuresed with IV Lasix and had net -23 L. He also underwent LLE angiogram and LHC on 3/18. LHC with moderately severe stenosis in a small posterolateral branch of RCA but no other significant CAD.  He was discharged on Lasix 80 mg twice daily, Entresto, Coreg and Aldactone as below.  We have also started low-dose Jardiance.  Cardiology to arrange outpatient follow-up.  Patient had left-sided headache behind his eye and blurry vision after his catheterizations.  Also mild tenderness over left temporal area.  CRP and ESR elevated but difficult to interpret in the setting of diabetic foot infection.  Eye pain and vision resolved.  Recommend outpatient follow-up with ophthalmology for routine eye exam given history of diabetes.  Patient developed "right groin pain".  CT abdomen and pelvis on 3/24 with right rectus sheath hematoma measuring 9.9 x 8.6 x 8.0  cm and adjacent extraperitoneal hemorrhage in the right pelvis with mild mass-effect on the urinary bladder.  Discussed this with vascular surgery, Dr. [name] who didn't think the hematoma is related to catheterization.  Patient is on Xarelto for A. fib.  Given stable hemoglobin, it was decided to continue Xarelto and monitor.  Please recheck CBC in 3 days or sooner if needed.   Patient was evaluated by PT/OT who recommended SNF.  See individual problem list below  for more hospital course. Discharge Diagnoses:  Diabetic foot wound/abscess with peripheral artery disease-ABI completed as an outpatient on 3/3 which revealed diminished flow to the left foot. CRP 12, sed rate 53.  Blood cultures negative so far.  Tissue cultures with Proteus mirabilis. -Debridement by podiatry, Dr. [name] on 3/17 and 3/22 -LLE angiogram as above. -Daily dressing and nonweightbearing on left foot. -Augmentin--> Vanco/zosyn> Unasyn> Augmentin-for 10 more days -Outpatient follow-up with podiatry  A. fib RVR: Likely due to fluid overload.  Rate controlled on Coreg. -Cardiology increased Coreg to 12.5 mg twice daily. -Continue Xarelto 20 mg  Acute on chronic combined systolic and diastolic heart failure: Echo with EF of 30 to 35% (unchanged from 2016), indeterminate LV diastolic and RV function, moderate LAE and ascending aortic dilation to 39 mm.  LHC as above.  Responding to IV Lasix.  Net -23 L since admission.  Lost about 30 pounds.  Discharge weight about 400 pounds.  Renal function stable.  Stable on room air. -Lasix 80 mg twice daily.  Coreg, Entresto and Aldactone as below. -Started low-dose Jardiance -Sodium and fluid restriction. -Recheck renal function and magnesium in 3 days -Cardiology to arrange outpatient follow-up.  Left sided headache/blurry vision-resolved.  Exam reassuring.  ESR and CRP elevation unreliable in the setting of diabetic foot infection.  Recommend outpatient follow-up for eye exam given history of uncontrolled diabetes.  Uncontrolled DM-2 with hyperglycemia and diabetic foot ulcer: A1c 11.2%.  Recent Labs    10/17/19 2105 10/18/19 0627 10/18/19 1133  GLUCAP 127* 104* 92  -Reduced NPH from 42 units to 35 units twice daily.  Discontinue NovoLog AC -Continue SSI-high -Resumed home Metformin.  Added low-dose Jardiance -Continue Lipitor 80 mg daily  AKI/azotemia: Baseline Cr 0.9-1.1> 1.45 (admit)> 1.18> 1.29>1.26> 1.07> 1.16> 1.29>  1.15 -Recheck in 3 days  Rectus sheath hematoma-noted on CT abdomen and pelvis.  No signs of active bleed.  -Recheck CBC in 3 days  OSA not compliant with CPAP-refused CPAP here  Morbid obesity: Body mass index is 51.36 kg/m. -Encourage lifestyle change to lose weight  Debility/physical deconditioning -PT/OT-SNF  Constipation -Senokot-S and MiraLAX ordered. -Metformin might help  Hyponatremia-resolved. -Continue monitoring   Nutrition Problem: Increased nutrient needs Etiology: wound healing  Signs/Symptoms: estimated needs  Interventions: MVI, Premier Protein, Refer to RD note for recommendations  Family communication -Updated patient's wife at bedside.    Discharge Instructions  Discharge Instructions    Amb Referral to Nutrition and Diabetic E   Complete by: As directed    Diet - low sodium heart healthy   Complete by: As directed    Fluid restriction to less than 1500 cc a day Sodium restriction to less than 2000 mg daily   Diet Carb Modified   Complete by: As directed    Increase activity slowly   Complete by: As directed      Allergies as of 10/18/2019   No Known Allergies     Medication List    STOP taking these medications   ammonium lactate  12 % cream Commonly known as: AMLACTIN   BD Pen Needle Nano 2nd Gen 32G X 4 MM Misc Generic drug: Insulin Pen Needle   naproxen sodium 220 MG tablet Commonly known as: ALEVE   NovoLIN 70/30 FlexPen (70-30) 100 UNIT/ML KwikPen Generic drug: insulin isophane & regular human   oxyCODONE-acetaminophen 5-325 MG tablet Commonly known as: PERCOCET/ROXICET   Santyl ointment Generic drug: collagenase   torsemide 20 MG tablet Commonly known as: DEMADEX     TAKE these medications   acetaminophen 500 MG tablet Commonly known as: TYLENOL Take 2 tablets (1,000 mg total) by mouth every 8 (eight) hours.   amoxicillin-clavulanate 875-125 MG tablet Commonly known as: AUGMENTIN Take 1 tablet by  mouth every 12 (twelve) hours for 10 days.   atorvastatin 80 MG tablet Commonly known as: LIPITOR Take 1 tablet (80 mg total) by mouth daily at 6 PM.   benzonatate 200 MG capsule Commonly known as: TESSALON Take 1 capsule (200 mg total) by mouth 3 (three) times daily as needed for cough.   carvedilol 12.5 MG tablet Commonly known as: COREG Take 1 tablet (12.5 mg total) by mouth 2 (two) times daily with a meal.   Ensure Max Protein Liqd Take 330 mLs (11 oz total) by mouth 2 (two) times daily.   furosemide 80 MG tablet Commonly known as: LASIX Take 1 tablet (80 mg total) by mouth 2 (two) times daily.   gabapentin 300 MG capsule Commonly known as: Neurontin Take 1 capsule (300 mg total) by mouth 2 (two) times daily.   insulin aspart 100 UNIT/ML injection Commonly known as: novoLOG Inject 0-20 Units into the skin 3 (three) times daily with meals. CBG 70 - 120: 0 units CBG 121 - 150: 3 units CBG 151 - 200: 4 units CBG 201 - 250: 7 units CBG 251 - 300: 11 units CBG 301 - 350: 15 units CBG 351 - 400: 20 units CBG > 400: call MD and obtain STAT lab verification   insulin NPH Human 100 UNIT/ML injection Commonly known as: NOVOLIN N Inject 0.35 mLs (35 Units total) into the skin 2 (two) times daily at 8 am and 10 pm.   Jardiance 10 MG Tabs tablet Generic drug: empagliflozin Take 10 mg by mouth daily before breakfast.   metFORMIN 500 MG 24 hr tablet Commonly known as: GLUCOPHAGE-XR Take 2 tablets (1,000 mg total) by mouth in the morning and at bedtime.   multivitamin with minerals Tabs tablet Take 1 tablet by mouth daily.   nitroGLYCERIN 0.4 MG SL tablet Commonly known as: NITROSTAT Place 1 tablet (0.4 mg total) under the tongue every 5 (five) minutes x 3 doses as needed for chest pain.   polyethylene glycol 17 g packet Commonly known as: MIRALAX / GLYCOLAX Take 17 g by mouth 2 (two) times daily as needed for mild constipation.   rivaroxaban 20 MG Tabs tablet Commonly  known as: XARELTO Take 1 tablet (20 mg total) by mouth daily with supper.   saccharomyces boulardii 250 MG capsule Commonly known as: Florastor Take 1 capsule (250 mg total) by mouth 2 (two) times daily.   sacubitril-valsartan 24-26 MG Commonly known as: ENTRESTO Take 1 tablet by mouth 2 (two) times daily.   spironolactone 25 MG tablet Commonly known as: ALDACTONE Take 1 tablet (25 mg total) by mouth daily.   Vitamin D (Ergocalciferol) 1.25 MG (50000 UNIT) Caps capsule Commonly known as: DRISDOL Take 1 capsule (50,000 Units total) by mouth every 7 (seven) days. Start  taking on: October 23, 2019            Durable Medical Equipment  (From admission, onward)         Start     Ordered   10/16/19 1710  For home use only DME Cane  Once     10/16/19 1709   10/16/19 1709  For home use only DME Bedside commode  Once    Comments: bariatric  Question:  Patient needs a bedside commode to treat with the following condition  Answer:  Weakness   10/16/19 1709   10/16/19 1708  For home use only DME Walker rolling  Once    Comments: Bariatric  Question Answer Comment  Walker: With 5 Inch Wheels   Patient needs a walker to treat with the following condition Weakness      10/16/19 1708          Consultations:  Podiatry  Vascular surgery  Cardiology  Procedures/Studies: 3/17-left foot wound debridement by Dr. [name]  3/18-LLE angiogram revealed occlusion of posterior tibial artery in mid calf with reconstitution via peroneal which is the main runoff to the foot but no flow obstructing stenosis proximally.    3/18-LHC with moderately severe stenosis in a small posterolateral branch of RCA but no other significant CAD.  3/22-left foot wound debridement by Dr. Earleen Newport   2D Echo on 10/09/2019 1. Left ventricular ejection fraction, by estimation, is 30 to 35%. The  left ventricle has moderately decreased function. The left ventricle  demonstrates global hypokinesis. The  left ventricular internal cavity size  was mildly dilated. Left ventricular  diastolic parameters are indeterminate.  2. Right ventricular systolic function was not well visualized. The right  ventricular size is not well visualized.  3. Left atrial size was moderately dilated.  4. The mitral valve is abnormal. Trivial mitral valve regurgitation.  5. The aortic valve is tricuspid. Aortic valve regurgitation is not  visualized.  6. Aortic dilatation noted. There is mild dilatation of the ascending  aorta measuring 39 mm.  7. The inferior vena cava is dilated in size with <50% respiratory  variability, suggesting right atrial pressure of 15 mmHg.    DG Chest 2 View  Result Date: 10/08/2019 CLINICAL DATA:  54 year old male with history of shortness of breath. EXAM: CHEST - 2 VIEW COMPARISON:  Chest x-ray 10/01/2015. FINDINGS: There is cephalization of the pulmonary vasculature and slight indistinctness of the interstitial markings suggestive of mild pulmonary edema. No definite pleural effusions. Mild cardiomegaly. Upper mediastinal contours are within normal limits. IMPRESSION: 1. The appearance of the chest suggests mild congestive heart failure, as above Electronically Signed   By: Vinnie Langton M.D.   On: 10/08/2019 15:55   CT ABDOMEN PELVIS W CONTRAST  Result Date: 10/17/2019 CLINICAL DATA:  Abdominal abscess/infection suspected. Review of electronic records demonstrates patient complaining of right groin pain. Recent lower extremity angiogram. EXAM: CT ABDOMEN AND PELVIS WITH CONTRAST TECHNIQUE: Multidetector CT imaging of the abdomen and pelvis was performed using the standard protocol following bolus administration of intravenous contrast. CONTRAST:  167m OMNIPAQUE IOHEXOL 300 MG/ML  SOLN COMPARISON:  Abdominopelvic CT 10/02/2015 FINDINGS: Lower chest: Mild dependent atelectasis. Mild cardiomegaly with coronary artery calcifications. Hepatobiliary: Hepatic steatosis without  evidence of focal hepatic lesion. Gallbladder physiologically distended, no calcified stone. No biliary dilatation. Pancreas: No ductal dilatation or inflammation. Spleen: Normal in size without focal abnormality. Adrenals/Urinary Tract: No adrenal nodule. No hydronephrosis or perinephric edema. Homogeneous renal enhancement with symmetric excretion on delayed  phase imaging. 2.1 cm simple cyst in the mid left kidney. Urinary bladder is partially distended without wall thickening. Bladder is displaced posteriorly by right rectus sheath hematoma. Stomach/Bowel: No evidence of bowel obstruction or acute bowel inflammation. Normal appendix courses posterior to the cecum. Ingested material within the stomach. There is colonic tortuosity with moderate volume of stool throughout the entire colon. Stool distends the rectum. No abnormal rectal wall thickening. Vascular/Lymphatic: Heterogeneous enlargement of the lower right rectus abdominus muscles consistent with rectus sheath hematoma. There is adjacent hemorrhage in the pelvis that appears extraperitoneal. No obvious active extravasation. Mild aortic atherosclerosis. No aortic aneurysm. Scattered bilateral inguinal lymph nodes likely reactive. Reproductive: Prostate is unremarkable. Other: Lower right rectus sheath hematoma measuring at least 9.9 x 8.6 x 8.0 cm. There is adjacent extraperitoneal hemorrhage in the right pelvis. No free air. Musculoskeletal: Multilevel degenerative change in the thoracic spine. 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 This is unchanged from prior exam. IMPRESSION: 1. Right rectus sheath hematoma measuring at least 9.9 x 8.6 x 8.0 cm. Adjacent extraperitoneal hemorrhage in the right pelvis which causes mild displacement and mass effect on the urinary bladder. 2. Hepatic steatosis. 3. Colonic tortuosity with moderate volume of stool throughout the colon and  stool distending the rectum, suggesting constipation. No bowel obstruction or inflammation. Aortic Atherosclerosis (ICD10-I70.0). Electronically Signed   By: Keith Rake M.D.   On: 10/17/2019 15:38   CT FOOT LEFT W CONTRAST  Result Date: 10/08/2019 CLINICAL DATA:  Lower extremity abscess. EXAM: CT OF THE LOWER LEFT EXTREMITY WITH CONTRAST TECHNIQUE: Multidetector CT imaging of the lower left extremity was performed according to the standard protocol following intravenous contrast administration. COMPARISON:  Foot radiograph earlier today. CONTRAST:  100 cc Omnipaque 300 IV FINDINGS: Bones/Joint/Cartilage No bony destruction or periosteal reaction. No fracture or dislocation. Suggestion of pes planus. No ankle joint effusion. Ligaments Suboptimally assessed by CT. Muscles and Tendons No intramuscular fluid collection. There is fatty atrophy of the intrinsic musculature of the foot. Soft tissues Skin defect about the plantar aspect of the midfoot subjacent to the fifth metatarsal. Patchy soft tissue air in the subcutaneous tissues of the foot remains localized to the superficial compartment. There is associated skin thickening. No focal fluid collection or abscess. No radiopaque foreign body. Prominent generalized subcutaneous soft tissue edema is noted. Age advanced vascular calcifications. IMPRESSION: 1. Skin defect about the plantar aspect of the midfoot subjacent to the fifth metatarsal. Patchy soft tissue air remains localized to the superficial soft tissues of the plantar foot. This is likely related ulcer, however recommend clinical correlation for gangrene. No focal fluid collection or abscess. No radiopaque foreign body. No CT findings of osteomyelitis. 2. Prominent generalized subcutaneous soft tissue edema about the foot. 3. Age advanced vascular calcifications. Electronically Signed   By: Keith Rake M.D.   On: 10/08/2019 21:06   DG Foot Complete Left  Result Date: 10/08/2019 CLINICAL  DATA:  Short of breath EXAM: LEFT FOOT - COMPLETE 3+ VIEW COMPARISON:  09/03/2019 FINDINGS: Pes planus. Negative for fracture or arthropathy.  Arterial calcification. IMPRESSION: No acute abnormality Electronically Signed   By: Franchot Gallo M.D.   On: 10/08/2019 15:55   VAS [place] ABI WITH/WO TBI  Result Date: 09/26/2019 LOWER EXTREMITY DOPPLER STUDY Indications: Ulceration, and patient is not able to ambulate much due to the              ulcer  on the mid plantar portion of the left foot. High Risk Factors: Hypertension, hyperlipidemia, Diabetes, no history of                    smoking.  Comparison Study: In 04/2017, an arterial Doppler performed at Cardiovascular                   Imaging at [facility] showed non-compressible vessels,                   bilaterally. Performing Technologist: Sharlett Iles RVT  Examination Guidelines: A complete evaluation includes at minimum, Doppler waveform signals and systolic blood pressure reading at the level of bilateral brachial, anterior tibial, and posterior tibial arteries, when vessel segments are accessible. Bilateral testing is considered an integral part of a complete examination. Photoelectric Plethysmograph (PPG) waveforms and toe systolic pressure readings are included as required and additional duplex testing as needed. Limited examinations for reoccurring indications may be performed as noted.  ABI Findings: +-------+------------+-----------+----------------+------------+ ABI/TBIToday's ABI Today's TBIPrevious ABI    Previous TBI +-------+------------+-----------+----------------+------------+ Right  not assessed1.11       non-compressible.97          +-------+------------+-----------+----------------+------------+ Left   not assessed1.10       non-compressible.98          +-------+------------+-----------+----------------+------------+ TOES Findings: +----------+---------------+--------+-------+ Right ToesPressure (mmHg)WaveformComment  +----------+---------------+--------+-------+ 1st Digit                Normal          +----------+---------------+--------+-------+ 2nd Digit                Normal          +----------+---------------+--------+-------+ 3rd Digit                Normal          +----------+---------------+--------+-------+ 4th Digit                Normal          +----------+---------------+--------+-------+ 5th Digit                Normal          +----------+---------------+--------+-------+ Difficulty assessing toe waveforms due to A-fib; waveforms appear to be normal. +---------+---------------+--------+-------+ Left ToesPressure (mmHg)WaveformComment +---------+---------------+--------+-------+ 1st Digit               Normal          +---------+---------------+--------+-------+ 2nd Digit               Normal          +---------+---------------+--------+-------+ 3rd Digit               Normal          +---------+---------------+--------+-------+ 4th Digit               Normal          +---------+---------------+--------+-------+ 5th Digit               Normal          +---------+---------------+--------+-------+ Difficulty assessing toe waveforms due to A-fib; waveforms appear to be normal.   Summary: Right: Unable to document waveforms/pressures due to body habitus. Excessive harden, cracked skin prevents CW Doppler penetration. Left: Unable to document waveforms/pressures due to body habitus. Excessive harden, cracked skin prevents CW Doppler penetration.  *See table(s) above for measurements and observations. See LE Arterial duplex report. Electronically signed by Ida Rogue MD on 09/26/2019  at 10:01:41 PM.    Final    ECHOCARDIOGRAM COMPLETE  Result Date: 10/09/2019    ECHOCARDIOGRAM REPORT   Patient Name:   BRITTEN SEYFRIED Date of Exam: 10/09/2019 Medical Rec #:  [id number]          Height:       74.0 in Accession #:    [id number]         Weight:       384.3 lb  Date of Birth:  20-Sep-1965           BSA:          2.869 m Patient Age:    54 years           BP:           112/72 mmHg Patient Gender: M                  HR:           94 bpm. Exam Location:  Inpatient Procedure: 2D Echo, Cardiac Doppler and Color Doppler Indications:    Atrial Fibrillation 427.31  History:        Patient has prior history of Echocardiogram examinations, most                 recent 01/31/2015. Arrythmias:Atrial Fibrillation; Risk                 Factors:Hypertension, Diabetes, Dyslipidemia and Non-Smoker.  Sonographer:    Vickie Epley RDCS Referring Phys: [phone] JARED M GARDNER  Sonographer Comments: Patient is morbidly obese and suboptimal apical window. Image acquisition challenging due to uncooperative patient. Patient refused definity. IMPRESSIONS  1. Left ventricular ejection fraction, by estimation, is 30 to 35%. The left ventricle has moderately decreased function. The left ventricle demonstrates global hypokinesis. The left ventricular internal cavity size was mildly dilated. Left ventricular diastolic parameters are indeterminate.  2. Right ventricular systolic function was not well visualized. The right ventricular size is not well visualized.  3. Left atrial size was moderately dilated.  4. The mitral valve is abnormal. Trivial mitral valve regurgitation.  5. The aortic valve is tricuspid. Aortic valve regurgitation is not visualized.  6. Aortic dilatation noted. There is mild dilatation of the ascending aorta measuring 39 mm.  7. The inferior vena cava is dilated in size with <50% respiratory variability, suggesting right atrial pressure of 15 mmHg. FINDINGS  Left Ventricle: Left ventricular ejection fraction, by estimation, is 30 to 35%. The left ventricle has moderately decreased function. The left ventricle demonstrates global hypokinesis. The left ventricular internal cavity size was mildly dilated. There is no left ventricular hypertrophy. Left ventricular diastolic parameters are  indeterminate. Right Ventricle: The right ventricular size is not well visualized. Right vetricular wall thickness was not assessed. Right ventricular systolic function was not well visualized. Left Atrium: Left atrial size was moderately dilated. Right Atrium: Right atrial size was normal in size. Pericardium: There is no evidence of pericardial effusion. Mitral Valve: The mitral valve is abnormal. Mild mitral annular calcification. Trivial mitral valve regurgitation. Tricuspid Valve: The tricuspid valve is not well visualized. Tricuspid valve regurgitation is trivial. Aortic Valve: The aortic valve is tricuspid. Aortic valve regurgitation is not visualized. Pulmonic Valve: The pulmonic valve was normal in structure. Pulmonic valve regurgitation is not visualized. Aorta: Aortic dilatation noted. There is mild dilatation of the ascending aorta measuring 39 mm. Venous: The inferior vena cava is dilated in size with less than 50% respiratory variability, suggesting right  atrial pressure of 15 mmHg. IAS/Shunts: The interatrial septum was not well visualized.  LEFT VENTRICLE PLAX 2D LVIDd:         5.79 cm LVIDs:         4.88 cm LV PW:         0.95 cm LV IVS:        0.95 cm LVOT diam:     2.60 cm LV SV:         70 LV SV Index:   24 LVOT Area:     5.31 cm  RIGHT VENTRICLE TAPSE (M-mode): 1.4 cm LEFT ATRIUM              Index       RIGHT ATRIUM           Index LA diam:        5.10 cm  1.78 cm/m  RA Area:     26.80 cm LA Vol (A2C):   94.6 ml  32.98 ml/m RA Volume:   92.80 ml  32.35 ml/m LA Vol (A4C):   109.0 ml 38.00 ml/m LA Biplane Vol: 106.0 ml 36.95 ml/m  AORTIC VALVE LVOT Vmax:   77.00 cm/s LVOT Vmean:  50.700 cm/s LVOT VTI:    0.131 m  AORTA Ao Root diam: 4.00 cm Ao Asc diam:  3.90 cm  SHUNTS Systemic VTI:  0.13 m Systemic Diam: 2.60 cm Lyman Bishop MD Electronically signed by Lyman Bishop MD Signature Date/Time: 10/09/2019/3:43:30 PM    Final    VAS [place] LOWER EXTREMITY ARTERIAL DUPLEX  Result Date:  09/26/2019 LOWER EXTREMITY ARTERIAL DUPLEX STUDY Indications: Ulceration, and patient is not able to ambulate much due to the              ulcer on the mid plantar portion of the left foot. High Risk Factors: Hypertension, hyperlipidemia, Diabetes, no history of                    smoking.  Current ABI: not assessed, see ABI report. Comparison Study: NA Performing Technologist: Sharlett Iles RVT  Examination Guidelines: A complete evaluation includes B-mode imaging, spectral Doppler, color Doppler, and power Doppler as needed of all accessible portions of each vessel. Bilateral testing is considered an integral part of a complete examination. Limited examinations for reoccurring indications may be performed as noted.  +----------+--------+-----+---------------+--------------+---------------------+ RIGHT     PSV cm/sRatioStenosis       Waveform      Comments              +----------+--------+-----+---------------+--------------+---------------------+ CFA Prox  126                         triphasic     heterogenous          +----------+--------+-----+---------------+--------------+---------------------+ DFA       68                          triphasic     heterogenous          +----------+--------+-----+---------------+--------------+---------------------+ SFA Prox  194          30-49% stenosismultiphasic   medial calcification,  velocities distal to                                                      the ostium            +----------+--------+-----+---------------+--------------+---------------------+ SFA Mid   116                                       medial calcification,                                                     hyperemic flow        +----------+--------+-----+---------------+--------------+---------------------+ SFA Distal138                                       medial calcification,                                                      hyperemic flow        +----------+--------+-----+---------------+--------------+---------------------+ POP Prox  152          30-49% stenosis              medial calcification,                                                     hyperemic flow        +----------+--------+-----+---------------+--------------+---------------------+ POP Mid   99                                        medial calcification,                                                     hyperemic flow        +----------+--------+-----+---------------+--------------+---------------------+ POP Distal141                                       medial calcification,                                                     hyperemic flow        +----------+--------+-----+---------------+--------------+---------------------+ TP Trunk  132  medial calcification,                                                     hyperemic flow        +----------+--------+-----+---------------+--------------+---------------------+ ATA Prox  145                         hyperemic flow                      +----------+--------+-----+---------------+--------------+---------------------+ ATA Mid   62                          hyperemic flow                      +----------+--------+-----+---------------+--------------+---------------------+ ATA Distal52                          biphasic                            +----------+--------+-----+---------------+--------------+---------------------+ PTA Prox  91                                        hyperemic flow        +----------+--------+-----+---------------+--------------+---------------------+ PTA Mid   87                          multiphasic                         +----------+--------+-----+---------------+--------------+---------------------+ PTA  Distal52                          multiphasic                         +----------+--------+-----+---------------+--------------+---------------------+ A focal velocity elevation of 194 cm/s was obtained at proximal SFA, distal to the ostium with a VR of 1.4. Findings are characteristic of 30-49% stenosis. A 2nd focal velocity elevation was visualized, measuring 152 cm/s at above-the-knee popliteal artery with a VR of 1.1. Findings are characteristic of 30-49% stenosis. Avascular cystic structure with minimum internal debris, measuring 2.5 x .98 x 1.30 cm.  +----------+--------+-----+---------------+--------------+---------------------+ LEFT      PSV cm/sRatioStenosis       Waveform      Comments              +----------+--------+-----+---------------+--------------+---------------------+ CFA Prox  140                         multiphasic   heterogenous          +----------+--------+-----+---------------+--------------+---------------------+ DFA       66                          multiphasic   heterogenous          +----------+--------+-----+---------------+--------------+---------------------+ SFA Prox  175          30-49% stenosishyperemic flowheterogenous,  velocities distal to                                                      the ostium            +----------+--------+-----+---------------+--------------+---------------------+ SFA Mid   172          30-49% stenosis              medial calcification,                                                     hyperemic flow        +----------+--------+-----+---------------+--------------+---------------------+ SFA Distal219          30-49% stenosis              medial calcification,                                                     hyperemic flow and                                                        stenosis based on                                                          velocity ratio        +----------+--------+-----+---------------+--------------+---------------------+ POP Prox  190          30-49% stenosis              medial calcification,                                                     hyperemic flow        +----------+--------+-----+---------------+--------------+---------------------+ POP Mid   187          30-49% stenosis              medial calcification,                                                     hyperemic flow        +----------+--------+-----+---------------+--------------+---------------------+ POP Distal158          30-49% stenosis              medial calcification,  hyperemic flow        +----------+--------+-----+---------------+--------------+---------------------+ TP Trunk  188          30-49% stenosis              medial calcification,                                                     hyperemic flow        +----------+--------+-----+---------------+--------------+---------------------+ ATA Prox  78                          hyperemic flow                      +----------+--------+-----+---------------+--------------+---------------------+ ATA Mid   135                         hyperemic flow                      +----------+--------+-----+---------------+--------------+---------------------+ ATA Distal101                         hyperemic flow                      +----------+--------+-----+---------------+--------------+---------------------+ PTA Mid   37                          hyperemic flow                      +----------+--------+-----+---------------+--------------+---------------------+ PERO Mid  106                         hyperemic flow                      +----------+--------+-----+---------------+--------------+---------------------+ A focal  velocity elevation of 175 cm/s was obtained at proximal SFA, distal to the ostium with a VR of 1.1. Findings are characteristic of 30-49% stenosis. A 2nd focal velocity elevation was visualized, measuring 219 cm/s at distal SFA with a VR of 1.49.  Findings are characteristic of 30-49% stenosis. Enlarged lymph node noted in the groin, measuring 5.1 x 1.5 x 4.9 cm.  Summary: Right: 30-49% stenosis in the proximal SFA, distal to the ostium. 30-49% stenosis in the proximal popliteal artery. Left: 30-49% stenosis in the proximal and mid SFA. 30-49% stenosis in the distal SFA, per velocity ratio. 30-49% stenosis in the proximal-distal popliteal vein. 30-49% stenosis in the TPT. Medial calcifications and hyperemic flow noted, bilaterally. Technically challenging exam due to body habitus and hardening of the lower legs.  See table(s) above for measurements and observations. Vascular consult recommended if clinically indicated. Electronically signed by Ida Rogue MD on 09/26/2019 at 9:43:52 PM.    Final    VAS [place] LOWER EXTREMITY VENOUS (DVT) (ONLY [facility] & [facility])  Result Date: 10/08/2019  Lower Venous DVT Study Indications: Swelling.  Risk Factors: None identified. Limitations: Body habitus, poor ultrasound/tissue interface and patient positioning. Comparison Study: No prior studies. Performing Technologist: Oliver Hum RVT  Examination Guidelines: A complete evaluation includes B-mode imaging, spectral Doppler, color Doppler, and power Doppler as needed of all accessible portions  of each vessel. Bilateral testing is considered an integral part of a complete examination. Limited examinations for reoccurring indications may be performed as noted. The reflux portion of the exam is performed with the patient in reverse Trendelenburg.  +-----+---------------+---------+-----------+----------+--------------+ RIGHTCompressibilityPhasicitySpontaneityPropertiesThrombus Aging  +-----+---------------+---------+-----------+----------+--------------+ CFV  Full           Yes      Yes                                 +-----+---------------+---------+-----------+----------+--------------+   +---------+---------------+---------+-----------+----------+-------------------+ LEFT     CompressibilityPhasicitySpontaneityPropertiesThrombus Aging      +---------+---------------+---------+-----------+----------+-------------------+ CFV      Full           Yes      Yes                                      +---------+---------------+---------+-----------+----------+-------------------+ SFJ      Full                                                             +---------+---------------+---------+-----------+----------+-------------------+ FV Prox  Full                                                             +---------+---------------+---------+-----------+----------+-------------------+ FV Mid   Full           Yes      Yes                                      +---------+---------------+---------+-----------+----------+-------------------+ FV Distal               Yes      Yes                                      +---------+---------------+---------+-----------+----------+-------------------+ POP      Full           Yes      Yes                                      +---------+---------------+---------+-----------+----------+-------------------+ PTV                                                   Patency shown with  color doppler       +---------+---------------+---------+-----------+----------+-------------------+ PERO                                                  Patency shown with                                                        color doppler       +---------+---------------+---------+-----------+----------+-------------------+     Summary: RIGHT: - No  evidence of common femoral vein obstruction.  LEFT: - There is no evidence of deep vein thrombosis in the lower extremity. However, portions of this examination were limited- see technologist comments above.  - No cystic structure found in the popliteal fossa.  *See table(s) above for measurements and observations. Electronically signed by Ruta Hinds MD on 10/08/2019 at 7:02:07 PM.    Final    HYBRID OR IMAGING ([place])  Result Date: 10/11/2019 There is no interpretation for this exam.  This order is for images obtained during a surgical procedure.  Please See "Surgeries" Tab for more information regarding the procedure.       Discharge Exam: Vitals:   10/18/19 0424 10/18/19 0425  BP:    Pulse:    Resp: 12 20  Temp:    SpO2:      GENERAL: No acute distress.  Appears well.  HEENT: MMM.  Vision and hearing grossly intact.  NECK: Supple.  No apparent JVD.  RESP:  No IWOB. Good air movement bilaterally. CVS: RRR.  Normal rate.Marland Kitchen Heart sounds normal.  ABD/GI/GU: Bowel sounds present. Soft.  RLQ tenderness.  MSK/EXT:  Moves extremities.  Stasis dermatitis bilaterally.  Dressing over left foot DCI. SKIN: Stasis dermatitis bilaterally.  Dressing over left foot DCI. NEURO: Awake, alert and oriented appropriately.  No apparent focal neuro deficit. PSYCH: Calm. Normal affect.   The results of significant diagnostics from this hospitalization (including imaging, microbiology, ancillary and laboratory) are listed below for reference.     Microbiology: Recent Results (from the past 240 hour(s))  SARS CORONAVIRUS 2 (TAT 6-24 HRS) Nasopharyngeal Nasopharyngeal Swab     Status: None   Collection Time: 10/08/19  6:08 PM   Specimen: Nasopharyngeal Swab  Result Value Ref Range Status   SARS Coronavirus 2 NEGATIVE NEGATIVE Final    Comment: (NOTE) SARS-CoV-2 target nucleic acids are NOT DETECTED. The SARS-CoV-2 RNA is generally detectable in upper and lower respiratory specimens during the  acute phase of infection. Negative results do not preclude SARS-CoV-2 infection, do not rule out co-infections with other pathogens, and should not be used as the sole basis for treatment or other patient management decisions. Negative results must be combined with clinical observations, patient history, and epidemiological information. The expected result is Negative. Fact Sheet for Patients: SugarRoll.be Fact Sheet for Healthcare Providers: https://www.woods-mathews.com/ This test is not yet approved or cleared by the [place] FDA and  has been authorized for detection and/or diagnosis of SARS-CoV-2 by FDA under an Emergency Use Authorization (EUA). This EUA will remain  in effect (meaning this test can be used) for the duration of the COVID-19 declaration under Section 56 4(b)(1) of the Act, 21 U.S.C. section 360bbb-3(b)(1), unless  the authorization is terminated or revoked sooner. Performed at [facility], [place] [address] [place], [place] [address]   Blood Cultures x 2 sites     Status: None   Collection Time: 10/08/19 10:24 PM   Specimen: BLOOD  Result Value Ref Range Status   Specimen Description BLOOD RIGHT UPPER ARM  Final   Special Requests   Final    BOTTLES DRAWN AEROBIC AND ANAEROBIC Blood Culture results may not be optimal due to an inadequate volume of blood received in culture bottles   Culture   Final    NO GROWTH 5 DAYS Performed at [facility], [place] [address] [place], [place] [address]    Report Status 10/13/2019 FINAL  Final  Blood Cultures x 2 sites     Status: None   Collection Time: 10/08/19 10:45 PM   Specimen: BLOOD  Result Value Ref Range Status   Specimen Description BLOOD LEFT ARM  Final   Special Requests   Final    BOTTLES DRAWN AEROBIC AND ANAEROBIC Blood Culture results may not be optimal due to an inadequate volume of blood received in culture bottles   Culture   Final    NO GROWTH 5  DAYS Performed at [facility], [place] [address] [place], [place] [address]    Report Status 10/13/2019 FINAL  Final  MRSA PCR Screening     Status: None   Collection Time: 10/09/19  3:12 AM   Specimen: Nasopharyngeal  Result Value Ref Range Status   MRSA by PCR NEGATIVE NEGATIVE Final    Comment:        The GeneXpert MRSA Assay (FDA approved for NASAL specimens only), is one component of a comprehensive MRSA colonization surveillance program. It is not intended to diagnose MRSA infection nor to guide or monitor treatment for MRSA infections. Performed at [facility], [place] [address], [place], [place] [address]   Aerobic/Anaerobic Culture (surgical/deep wound)     Status: None   Collection Time: 10/10/19 12:15 PM   Specimen: Wound  Result Value Ref Range Status   Specimen Description WOUND  Final   Special Requests LEFT FOOT  Final   Gram Stain   Final    FEW WBC PRESENT, PREDOMINANTLY PMN MODERATE GRAM POSITIVE COCCI    Culture   Final    FEW PROTEUS MIRABILIS FEW BACTEROIDES THETAIOTAOMICRON BETA LACTAMASE POSITIVE Performed at [facility], [place] [address] [place],  [address]    Report Status 10/14/2019 FINAL  Final   Organism ID, Bacteria PROTEUS MIRABILIS  Final      Susceptibility   Proteus mirabilis - MIC*    AMPICILLIN RESISTANT Resistant     CEFAZOLIN 32 INTERMEDIATE Intermediate     CEFEPIME <=0.12 SENSITIVE Sensitive     CEFTAZIDIME <=1 SENSITIVE Sensitive     CEFTRIAXONE <=0.25 SENSITIVE Sensitive     CIPROFLOXACIN <=0.25 SENSITIVE Sensitive     GENTAMICIN <=1 SENSITIVE Sensitive     IMIPENEM 2 SENSITIVE Sensitive     TRIMETH/SULFA <=20 SENSITIVE Sensitive     AMPICILLIN/SULBACTAM <=2 SENSITIVE Sensitive     PIP/TAZO <=4 SENSITIVE Sensitive     * FEW PROTEUS MIRABILIS  Surgical pcr screen     Status: None   Collection Time: 10/14/19  8:41 PM   Specimen: Nasal Mucosa; Nasal Swab  Result Value Ref Range Status   MRSA, PCR  NEGATIVE NEGATIVE Final   Staphylococcus aureus NEGATIVE NEGATIVE Final  Comment: (NOTE) The Xpert SA Assay (FDA approved for NASAL specimens in patients 56 years of age and older), is one component of a comprehensive surveillance program. It is not intended to diagnose infection nor to guide or monitor treatment. Performed at [facility], [place] [address] [place], [place] [address]   Aerobic/Anaerobic Culture (surgical/deep wound)     Status: None (Preliminary result)   Collection Time: 10/15/19 12:59 PM   Specimen: PATH Other; Tissue  Result Value Ref Range Status   Specimen Description FOOT WOUND LEFT  Final   Special Requests NONE  Final   Gram Stain   Final    FEW WBC PRESENT, PREDOMINANTLY PMN NO ORGANISMS SEEN Performed at [facility], [address] [place], [place] [address]    Culture   Final    RARE PROTEUS MIRABILIS NO ANAEROBES ISOLATED; CULTURE IN PROGRESS FOR 5 DAYS    Report Status PENDING  Incomplete   Organism ID, Bacteria PROTEUS MIRABILIS  Final      Susceptibility   Proteus mirabilis - MIC*    AMPICILLIN <=2 SENSITIVE Sensitive     CEFAZOLIN <=4 SENSITIVE Sensitive     CEFEPIME <=0.12 SENSITIVE Sensitive     CEFTAZIDIME <=1 SENSITIVE Sensitive     CEFTRIAXONE <=0.25 SENSITIVE Sensitive     CIPROFLOXACIN <=0.25 SENSITIVE Sensitive     GENTAMICIN <=1 SENSITIVE Sensitive     IMIPENEM 1 SENSITIVE Sensitive     TRIMETH/SULFA <=20 SENSITIVE Sensitive     AMPICILLIN/SULBACTAM <=2 SENSITIVE Sensitive     PIP/TAZO <=4 SENSITIVE Sensitive     * RARE PROTEUS MIRABILIS  SARS CORONAVIRUS 2 (TAT 6-24 HRS) Nasopharyngeal Nasopharyngeal Swab     Status: None   Collection Time: 10/17/19 10:57 AM   Specimen: Nasopharyngeal Swab  Result Value Ref Range Status   SARS Coronavirus 2 NEGATIVE NEGATIVE Final    Comment: (NOTE) SARS-CoV-2 target nucleic acids are NOT DETECTED. The SARS-CoV-2 RNA is generally detectable in upper and lower respiratory  specimens during the acute phase of infection. Negative results do not preclude SARS-CoV-2 infection, do not rule out co-infections with other pathogens, and should not be used as the sole basis for treatment or other patient management decisions. Negative results must be combined with clinical observations, patient history, and epidemiological information. The expected result is Negative. Fact Sheet for Patients: SugarRoll.be Fact Sheet for Healthcare Providers: https://www.woods-mathews.com/ This test is not yet approved or cleared by the [place] FDA and  has been authorized for detection and/or diagnosis of SARS-CoV-2 by FDA under an Emergency Use Authorization (EUA). This EUA will remain  in effect (meaning this test can be used) for the duration of the COVID-19 declaration under Section 56 4(b)(1) of the Act, 21 U.S.C. section 360bbb-3(b)(1), unless the authorization is terminated or revoked sooner. Performed at [facility], [place] [address] [place], [place] [address]      Labs: BNP (last 3 results) Recent Labs    10/09/19 1755  BNP 267.1*   Basic Metabolic Panel: Recent Labs  Lab 10/12/19 0152 10/12/19 0152 10/13/19 0640 10/13/19 0640 10/14/19 0406 10/15/19 0534 10/16/19 0407 10/17/19 0502 10/18/19 0403  NA 135   < > 136   < > 137 139 134* 136 138  K 4.7   < > 4.1   < > 4.3 3.7 4.1 4.0 3.9  CL 99   < > 102   < > 99 100 99 99 100  CO2 25   < >  25   < > '28 27 24 26 27  ' GLUCOSE 292*   < > 268*   < > 157* 160* 253* 216* 86  BUN 22*   < > 25*   < > 24* 28* 30* 26* 32*  CREATININE 1.26*   < > 1.16   < > 1.07 1.16 1.29* 1.14 1.16  CALCIUM 8.3*   < > 8.2*   < > 8.5* 8.8* 8.7* 8.7* 8.9  MG 2.0   < > 2.0   < > 1.9 1.9 1.9 1.8 1.9  PHOS 3.1  --  2.9  --  4.2 4.8*  --   --   --    < > = values in this interval not displayed.   Liver Function Tests: Recent Labs  Lab 10/12/19 0152 10/13/19 0640 10/14/19 0406  10/15/19 0534  ALBUMIN 2.7* 2.6* 2.6* 2.7*   No results for input(s): LIPASE, AMYLASE in the last 168 hours. No results for input(s): AMMONIA in the last 168 hours. CBC: Recent Labs  Lab 10/14/19 0406 10/15/19 0534 10/16/19 0407 10/17/19 0502 10/18/19 0403  WBC 9.7 10.2 12.4* 10.3 10.8*  HGB 12.9* 13.2 13.2 13.2 13.4  HCT 40.9 41.5 40.8 41.4 41.8  MCV 97.6 96.1 96.0 96.5 97.7  PLT 326 352 345 354 357   Cardiac Enzymes: No results for input(s): CKTOTAL, CKMB, CKMBINDEX, TROPONINI in the last 168 hours. BNP: Invalid input(s): POCBNP CBG: Recent Labs  Lab 10/17/19 1204 10/17/19 1612 10/17/19 2105 10/18/19 0627 10/18/19 1133  GLUCAP 160* 142* 127* 104* 92   D-Dimer No results for input(s): DDIMER in the last 72 hours. Hgb A1c No results for input(s): HGBA1C in the last 72 hours. Lipid Profile No results for input(s): CHOL, HDL, LDLCALC, TRIG, CHOLHDL, LDLDIRECT in the last 72 hours. Thyroid function studies No results for input(s): TSH, T4TOTAL, T3FREE, THYROIDAB in the last 72 hours.  Invalid input(s): FREET3 Anemia work up No results for input(s): VITAMINB12, FOLATE, FERRITIN, TIBC, IRON, RETICCTPCT in the last 72 hours. Urinalysis    Component Value Date/Time   COLORURINE AMBER (A) 10/01/2015 1652   APPEARANCEUR CLOUDY (A) 10/01/2015 1652   LABSPEC 1.027 10/01/2015 1652   PHURINE 5.5 10/01/2015 1652   GLUCOSEU >1000 (A) 10/01/2015 1652   HGBUR NEGATIVE 10/01/2015 1652   BILIRUBINUR NEGATIVE 10/01/2015 1652   KETONESUR NEGATIVE 10/01/2015 1652   PROTEINUR 30 (A) 10/01/2015 1652   UROBILINOGEN 1.0 07/07/2012 1544   NITRITE NEGATIVE 10/01/2015 1652   LEUKOCYTESUR NEGATIVE 10/01/2015 1652   Sepsis Labs Invalid input(s): PROCALCITONIN,  WBC,  LACTICIDVEN   Time coordinating discharge: 50 minutes  SIGNED:  Mercy Riding, MD  [facility] 10/18/2019, 11:59 AM  If 7PM-7AM, please contact night-coverage [email or web address] Password [email or web address]

## 2019-10-18 NOTE — Telephone Encounter (Signed)
Pt says he would like to speak to Dr. Ardelle Anton. He says it's urgent. Please advise.

## 2019-10-18 NOTE — Telephone Encounter (Signed)
Pt's ftr, Maurice Nguyen, Sr. states they are going to send pt home for 2 days to be taken care of by his friend and then someone is to come into the house for 3 days, pt was in hospital for internal bleeding and woke during the surgery and he doesn't understand why. Pt is asking for surgical note, but is being told he can't get them today, but will be mailed to him. Pt's ftr, states pt weighs 300lb and has never used crutches and can't use them.

## 2019-10-18 NOTE — Telephone Encounter (Signed)
Pt states they are trying to discharge him to rehab in [place], pt is on Room 3East 25, and he can not even stand up on the foot.

## 2019-10-18 NOTE — Telephone Encounter (Signed)
I have called the patients wife as she answered the phone. He is appealing his discharge.

## 2019-10-18 NOTE — Care Management (Signed)
Spoke to patient and significant other Maurice Nguyen at bedside.  Patient has order to DC to SNF, bed offer present to patient by CSW, and patient is refusing to leave today.  He is refusing to leave tonight and called [employer] to appeal DC. Patient states that he is agreeable to DC tomorrow to [facility]. Rutherford Nail CSW, has spoken w SNF and they state they will try to have a bed tomorrow.   We discussed the alternative of DC to home w [facility] services. It was explained to him, and verbalized understanding that [facility] would not be able to come to his house daily to change a dressing. It was explained to him that dressing changes, if left not done, or done incorrectly, would lead to worsening of infection that could potentially lead to amputation or even septic shock.

## 2019-10-18 NOTE — TOC Progression Note (Addendum)
Transition of Care [facility]) - Progression Note    Patient Details  Name: Maurice Nguyen MRN: [id number] Date of Birth: 1965/12/25  [facility]) CM/SW Contact  Terrial Rhodes, LCSWA Phone Number: 10/18/2019, 2:07 PM  Clinical Narrative:     CSWs saw patient and patients significant other [name] at bedside. Patient and [name] were upset that patient was being discharged today. They informed CSW that no one had let them know that they were ready for discharge today. CSW explained to patient and [name] that the doctors notes say he is medically ready for discharge. Patient told CSW that he still has internal bleeding and that he feels he does not need to be discharged today. [name] told CSW she is going to call the telephone number that was given to her to appeal him being discharged today. Patient told CSW he does not want to go to [facility] that he would like to go to [facility]. Patients says if he can not get in there he wants to go home with home health.  CSW left the room to see another patient and [name] patients significant other tried to come into the room. CSW told her to leave and this was not acceptable Catherine's behavior was aggressive and not appropriate. CSW went back to see [name] and patient and [name] apologized several times.   CSW followed up with [facility] and they do not have a bed that accomodates his weight. CSW,  RN, and case manager is making plans for him to discharge home with home health.      Barriers to Discharge: No Barriers Identified  Expected Discharge Plan and Services           Expected Discharge Date: 10/18/19                                     Social Determinants of Health (SDOH) Interventions    Readmission Risk Interventions No flowsheet data found.

## 2019-10-18 NOTE — Progress Notes (Signed)
   I evaluated patient reviewed CT scan.  Appears to have moderate sized rectus sheath hematoma likely unrelated to procedure more likely spontaneous or from low-grade trauma.  Hemoglobin and hematocrit has been stable can likely remain on Xarelto and should be okay from discharge from a vascular standpoint.  Aarik Blank C. Randie Heinz, MD [facility] Office: [phone] Pager: [phone]

## 2019-10-18 NOTE — TOC Progression Note (Addendum)
Transition of Care [facility]) - Progression Note    Patient Details  Name: Maurice Nguyen MRN: [id number] Date of Birth: [birth year]  Transition of Care [facility]) CM/SW [place], [place] Phone Number: 10/18/2019, 2:07 PM  Clinical Narrative:      CSW and colleague Ebony Hail CSW met with patient at bedside as family has requested to speak with CSW once again. CSW has met with family multiple times to prepare for this discharge today. Patient's friend Belenda Cruise at bedside very irate about discharge, both her and patient refusing discharge at this time stating patient is bleeding inside and in pain and cannot discharge today. CSW reiterated that MD has signed off on discharge and that patient has bed at [facility] which is also able to accommodate transferring him to and from his podiatrist appointments which was very important to patient. Belenda Cruise requests CSW double check if [facility] can take patient, if not they state they may end up going home with home health. Barnetta Chapel also stating she will call [employer] to appeal discharge.   CSW and Ebony Hail left patient room to go to another patient room for assessment and Barnetta Chapel followed into patient's room. CSW reiterated to her this was inappropriate behavior and she is not allowed to follow CSWs into other patient rooms.   Unit secretary has been informed and calling security. Patient and Barnetta Chapel extremely upset regarding discharge currently, although CSW has met with patient multiple times in the last three days to prepare for today's discharge to [facility].    CSW has followed up with [facility] and they are unsure if they are able to accommodate patient's weight and special bed needed (patient's weight barrier has been discussed with patient numerous times as far as SNF bed offers barrier). [facility] will get back with CSW and let me know if they can accommodate that and if they have the appropriate bariatric bed  he needs.   Update: [facility] reports they do not have the beds to accommodate patients weight at this time.       Barriers to Discharge: No Barriers Identified  Expected Discharge Plan and Services           Expected Discharge Date: 10/18/19                                     Social Determinants of Health (SDOH) Interventions    Readmission Risk Interventions No flowsheet data found.

## 2019-10-19 LAB — GLUCOSE, CAPILLARY: Glucose-Capillary: 183 mg/dL — ABNORMAL HIGH (ref 70–99)

## 2019-10-19 MED ORDER — LACTULOSE ENEMA
300.0000 mL | Freq: Once | ORAL | Status: AC
  Administered 2019-10-19: 300 mL via RECTAL
  Filled 2019-10-19: qty 300

## 2019-10-19 MED ORDER — MAGNESIUM HYDROXIDE 400 MG/5ML PO SUSP: 15.0000 mL | Freq: Every day | ORAL | Status: DC

## 2019-10-19 NOTE — TOC Transition Note (Signed)
Transition of Care [facility]) - CM/SW Discharge Note   Patient Details  Name: Maurice Nguyen MRN: [id number] Date of Birth: 07-11-1966  Transition of Care [facility]) CM/SW Contact:  Doy Hutching, LCSW Phone Number: 10/19/2019, 10:44 AM   Clinical Narrative:    Pt stable for discharge.  All paperwork sent thru hub, admissions confirmed with Lorrie at [facility]. Auth information confirmed w/ [employer]. Auth ID #[id number] Plan Auth #[id number] Updates to be sent to Deborah at [phone]. Approved for an initial 5 days 3/25-3/29.   Final next level of care: Skilled Nursing Facility Barriers to Discharge: Barriers Resolved   Patient Goals and CMS Choice Patient states their goals for this hospitalization and ongoing recovery are:: to go to skilled nursing facility CMS Medicare.gov Compare Post Acute Care list provided to:: Patient Choice offered to / list presented to : Patient  Discharge Placement PASRR number recieved: 10/17/19            Patient chooses bed at: [facility] and Rehab Patient to be transferred to facility by: [facility] Name of family member notified: pt responsible for self, he and his partner notified by Minna Merritts this morning Patient and family notified of of transfer: 10/19/19   Readmission Risk Interventions No flowsheet data found.

## 2019-10-19 NOTE — TOC Progression Note (Addendum)
Transition of Care [facility]) - Progression Note    Patient Details  Name: Maurice Nguyen MRN: [id number] Date of Birth: 1965/08/22  Transition of Care [facility]) CM/SW Contact  Leone Haven, RN Phone Number: 10/19/2019, 8:51 AM  Clinical Narrative:    NCM spoke with patient and his friend in the room they want to cancel the appeal.  His friend called [employer] to cancel the appeal. She left vm to cancel it. NCM was there in the room with them and listened to the call when they canceled it.      Barriers to Discharge: No Barriers Identified  Expected Discharge Plan and Services           Expected Discharge Date: 10/18/19                                     Social Determinants of Health (SDOH) Interventions    Readmission Risk Interventions No flowsheet data found.

## 2019-10-19 NOTE — Progress Notes (Signed)
Subjective: POD #3 return to the OR s/p left foot wound debridement, incision and drainage, graft application to the heel.  No pain currently to the foot.  Still having groin pain.  Had CT scan revealed hematoma.  Vascular surgery has seen the patient and does not feel is coming from the procedure.  Patient initially was upset today about being discharged without knowing what is going on however he states that now he understands he is amenable to discharge with appropriate follow-up.  He was worried about going to [facility] and not getting appropriate follow-up.  Denies any fevers, chills, nausea, vomiting.  No calf pain, chest pain, shortness of breath.  His eye pain resolved.  Objective: AAO x3, NAD Status post debridement of ulcerations x2 on the plantar foot.  Graft to the plantar aspect of heel which appears to be adhering.  Along the midfoot ulceration granular tissue is present.  Minimal tenderness there is no purulence identified any drainage.  There is no surrounding erythema, ascending cellulitis.  No fluctuation crepitation.  There is no malodor. No pain with calf compression, warmth, erythema.        Assessment: POD # 3 s/p return to the OR for left lower extremity wound debridement/ I&D/ heel wound graft  Plan: Change the dressing today.  K-Y jelly followed by Adaptic applied to the heel and Adaptic overlying the midfoot ulceration followed by 4 x 4's, Kerlix, Ace.  He will need this done daily as well when he goes to rehab.  Still recommend nonweightbearing.  Continue antibiotics for now until follow-up.  Monitor closely for signs or symptoms of infection.  We will follow-up with him in the office next week upon discharge.  He is amenable to discharge he has no further questions or concerns for me.  If I do not see the patient before discharge tomorrow we will have nursing change the bandage prior.  Ovid Curd, DPM O: [phone] C: [phone]

## 2019-10-19 NOTE — Care Management Important Message (Signed)
Important Message  Patient Details  Name: Maurice Nguyen MRN: [id number] Date of Birth: 03/21/1966   Medicare Important Message Given:  Yes     Renie Ora 10/19/2019, 9:30 AM

## 2019-10-19 NOTE — Progress Notes (Signed)
Patient was discharged yesterday but decided to stay overnight. No major events overnight or this morning. Has no complaints. No changes on exam. Discharge summary from yesterday stays in effect.

## 2019-10-19 NOTE — Social Work (Signed)
Clinical Social Worker facilitated patient discharge including contacting patient family and facility to confirm patient discharge plans.  Clinical information faxed to facility and family agreeable with plan.  CSW arranged ambulance transport via [facility] to [facility]. RN to call [phone]  with report prior to discharge.  Clinical Social Worker will sign off for now as social work intervention is no longer needed. Please consult [place] again if new need arises.  Octavio Graves, MSW, LCSW Clinical Social Worker

## 2019-10-20 LAB — AEROBIC/ANAEROBIC CULTURE W GRAM STAIN (SURGICAL/DEEP WOUND)

## 2019-10-23 ENCOUNTER — Ambulatory Visit: Payer: Medicare HMO | Admitting: Podiatry

## 2019-10-23 ENCOUNTER — Telehealth: Payer: Self-pay | Admitting: Podiatry

## 2019-10-23 NOTE — Telephone Encounter (Signed)
Pt called and he is stating that he is very unhappy in the facillity that hes in and  not very happy he was told that they couldn't bring him to his appt today that there vehicle is in the shop he feel like they arent taking care of his foot properly or him for that matter pt stated that he is highly upset and wants to leave the facillity. Maurice Nguyen wanted to know if Dr.Wagoner would call him asap he will also be trying to make it to his appt today

## 2019-10-23 NOTE — Telephone Encounter (Signed)
I called the patient to go over his concerns. He states they are giving him foods that have a lot of sugar and his blood sugar was originally in the 200's but it has been better now that he does not accept the food. His blood sugar this morning was 88 and at last check it was 128.   They have been changing the bandage but wants to come in to have his foot checked. He was suppose to come in today but they could not bring him due to the vehicle is in the shop. They said it has been looking good. He denies any fevers, chills or any other issues.   He said they are giving him all of his pills and he thinks he is getting the antibiotic.   Overall, he is not happy with the facility. He has expressed his concerns with them and today he states they have been better today. He is frustrated and just wants to do everything possible to avoid amputation.   He is coming in Thursday at 8:15 for follow up.

## 2019-10-24 ENCOUNTER — Telehealth: Payer: Self-pay | Admitting: *Deleted

## 2019-10-24 NOTE — Telephone Encounter (Signed)
I informed Soyla Dryer, PT of Dr. Gabriel Rung

## 2019-10-24 NOTE — Telephone Encounter (Signed)
He is nonweightbearing. I do not want weight on the foot but will reassess tomorrow.

## 2019-10-24 NOTE — Telephone Encounter (Signed)
Soyla Dryer, PT would like to know pt's weight bearing status.

## 2019-10-25 ENCOUNTER — Ambulatory Visit (INDEPENDENT_AMBULATORY_CARE_PROVIDER_SITE_OTHER): Payer: Medicare HMO | Admitting: Podiatry

## 2019-10-25 ENCOUNTER — Encounter: Payer: Self-pay | Admitting: Podiatry

## 2019-10-25 ENCOUNTER — Other Ambulatory Visit: Payer: Self-pay

## 2019-10-25 DIAGNOSIS — L97529 Non-pressure chronic ulcer of other part of left foot with unspecified severity: Secondary | ICD-10-CM

## 2019-10-25 DIAGNOSIS — E1142 Type 2 diabetes mellitus with diabetic polyneuropathy: Secondary | ICD-10-CM

## 2019-10-25 NOTE — Patient Instructions (Signed)
Continue current dressing changes daily Do not put weight on foot yet Keep legs elevated Continue antibiotics

## 2019-10-29 NOTE — Progress Notes (Signed)
Cardiology Office Note:    Date:  10/30/2019   ID:  Maurice Nguyen, DOB 1965-08-06, MRN [id number]  PCP:  Tracey Harries, MD  Cardiologist:  Kristeen Miss, MD   Electrophysiologist:  None   Referring MD: Tracey Harries, MD   Chief Complaint:  Hospitalization Follow-up (Admx w L foot wound, a/c CHF, AFib w/ RVR)    Patient Profile:    Maurice Nguyen is a 54 y.o. male with:   Combined systolic and diastolic CHF  Non-ischemic cardiomyopathy   Cath 09/2019: mild non-obs dz except for severe dz in a small caliber PL branch - med Rx   Paroxysmal atrial fibrillation   CHA2DS2-VASc=3 (HTN, CHF, Diab) >> Rivaroxaban    Hypertension   Diabetes mellitus  Peripheral Arterial Disease     A-gram 09/2019: L PTA occluded - med Rx  Morbid obesity   Hx of rupture appendix in 2013 - managed conservatively   Prior CV studies: Cardiac catheterization 10-23-19 LAD mid 30 LCx prox mild dz RCA large dominant; PL branch small caliber w/ moderately severe stenosis Med Rx  Echocardiogram 10/09/2019 EF 30-35, mod LAE, trivial MR, mild Ao dilation (39 mm)  LE Art [place] 09/26/2019 R prox SFA 30-49; prox popliteal 30-49 L prox and mid SFA 30-49, dist SFA 30-49, popliteal prox - dist 30-49, TPT 30-49  Echocardiogram 01/31/15 EF 30-35  History of Present Illness:    [name] has had intermittent follow up with Dr. Elease Hashimoto in clinic.  He was last seen in the office in 09/2017.  He was admitted 3/15-3/25 with a non-healing diabetic L foot wound in the setting of decompensated heart failure and AFib with RVR.  He diuresed 23 L and lost 30 lbs with IV Furosemide.  He underwent debridement of his L foot wound and was seen by vascular surgery.  His angiogram demonstrated an occluded L PTA.  He did not require intervention.  Cardiac catheterization demonstrated severe dz in a small PL branch.  Cardiomyopathy is out of proportion to coronary artery disease (non-ischemic cardiomyopathy).   Echocardiogram demonstrated and EF 30-35%.  Of note, he was noted to have a rectus sheath hematoma on CT.  There was no active sign of bleeding and Rivaroxaban was continued.    He returns for follow-up.  He is here alone.  He is currently at [facility] in [place].  He notes continued issues with shortness of breath.  He sleeps on an incline.  His lower extremity swelling remains improved.  He has not had chest pain or syncope.  He notes significant issues at the skilled nursing facility.  He notes issues with the diet he is being served.  He also notes that the staff is not willing to help him get up out of bed when he needs it.  He is worried about injuring his foot further.  He is significantly concerned about returning to this facility.     Past Medical History:  Diagnosis Date  . A-fib (HCC)   . Anticoagulated, on Xarelto, Chads2Vas score 2 10/02/2013  . Back pain, chronic 07/13/2012  . CHF (congestive heart failure) (HCC)   . 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 CKD (chronic kidney disease), stage II   . Complication of anesthesia    " THEY LOST ME AND BROUGHT ME BACK "  . Hypertension 07/13/2012  . Irregular heart beat   . Lumbar herniated disc   . Morbid obesity (HCC)   .  Obesity, morbid, BMI 50 or higher ([place]) 07/13/2012  . 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 Renal insufficiency 10/08/2019  . Shortness of breath   . Type II diabetes mellitus ([place]) 07/13/2012    Current Medications: Current Meds  Medication Sig  . acetaminophen (TYLENOL) 500 MG tablet Take 2 tablets (1,000 mg total) by mouth every 8 (eight) hours.  Marland Kitchen atorvastatin (LIPITOR) 80 MG tablet Take 1 tablet (80 mg total) by mouth daily at 6 PM.  . benzonatate (TESSALON) 200 MG capsule Take 1 capsule (200 mg total) by mouth 3 (three) times daily as needed for cough.  . carvedilol (COREG) 12.5 MG tablet Take 1 tablet (12.5 mg total) by mouth 2 (two) times daily with a meal.  .  empagliflozin (JARDIANCE) 10 MG TABS tablet Take 10 mg by mouth daily before breakfast.  . Ensure Max Protein (ENSURE MAX PROTEIN) LIQD Take 330 mLs (11 oz total) by mouth 2 (two) times daily.  . furosemide (LASIX) 80 MG tablet Take 1 tablet (80 mg total) by mouth 2 (two) times daily.  Marland Kitchen gabapentin (NEURONTIN) 300 MG capsule Take 1 capsule (300 mg total) by mouth 2 (two) times daily.  . insulin aspart (NOVOLOG) 100 UNIT/ML injection Inject 0-20 Units into the skin 3 (three) times daily with meals. CBG 70 - 120: 0 units CBG 121 - 150: 3 units CBG 151 - 200: 4 units CBG 201 - 250: 7 units CBG 251 - 300: 11 units CBG 301 - 350: 15 units CBG 351 - 400: 20 units CBG > 400: call MD and obtain STAT lab verification  . insulin NPH Human (NOVOLIN N) 100 UNIT/ML injection Inject 0.35 mLs (35 Units total) into the skin 2 (two) times daily at 8 am and 10 pm.  . metFORMIN (GLUCOPHAGE-XR) 500 MG 24 hr tablet Take 2 tablets (1,000 mg total) by mouth in the morning and at bedtime.  . Multiple Vitamin (MULTIVITAMIN WITH MINERALS) TABS tablet Take 1 tablet by mouth daily.  . nitroGLYCERIN (NITROSTAT) 0.4 MG SL tablet Place 1 tablet (0.4 mg total) under the tongue every 5 (five) minutes x 3 doses as needed for chest pain.  . polyethylene glycol (MIRALAX / GLYCOLAX) 17 g packet Take 17 g by mouth 2 (two) times daily as needed for mild constipation.  . rivaroxaban (XARELTO) 20 MG TABS tablet Take 1 tablet (20 mg total) by mouth daily with supper.  . saccharomyces boulardii (FLORASTOR) 250 MG capsule Take 1 capsule (250 mg total) by mouth 2 (two) times daily.  . sacubitril-valsartan (ENTRESTO) 24-26 MG Take 1 tablet by mouth 2 (two) times daily.  . Vitamin D, Ergocalciferol, (DRISDOL) 1.25 MG (50000 UNIT) CAPS capsule Take 1 capsule (50,000 Units total) by mouth every 7 (seven) days.  . [DISCONTINUED] spironolactone (ALDACTONE) 25 MG tablet Take 1 tablet (25 mg total) by mouth daily.     Allergies:   Patient has  no known allergies.   Social History   Tobacco Use  . Smoking status: Never Smoker  . Smokeless tobacco: Never Used  Substance Use Topics  . Alcohol use: No    Comment: rarely  . Drug use: No     Family Hx: The patient's family history is negative for Heart disease.  ROS   EKGs/Labs/Other Test Reviewed:    EKG:  EKG is   ordered today.  The ekg ordered today demonstrates atrial fibrillation, HR 95, left axis deviation, inferior Q waves, anteroseptal Q waves, QTC 437, no change from prior tracing  Recent Labs: 10/08/2019: ALT 27 10/09/2019: B Natriuretic Peptide 320.8 10/18/2019: BUN 32; Creatinine, Ser 1.16; Hemoglobin 13.4; Magnesium 1.9; Platelets 357; Potassium 3.9; Sodium 138   Recent Lipid Panel Lab Results  Component Value Date/Time   CHOL 109 10/10/2019 04:42 AM   TRIG 49 10/10/2019 04:42 AM   HDL 34 (L) 10/10/2019 04:42 AM   CHOLHDL 3.2 10/10/2019 04:42 AM   LDLCALC 65 10/10/2019 04:42 AM    Physical Exam:    VS:  BP 105/68   Pulse 87   Ht 6\' 2"  (1.88 m)   Wt (!) 402 lb 8 oz (182.6 kg) Comment: unable to get weight patient not abkle to stand  SpO2 98%   BMI 51.68 kg/m     Wt Readings from Last 3 Encounters:  10/30/19 (!) 402 lb 8 oz (182.6 kg)  10/19/19 (!) 405 lb 6.8 oz (183.9 kg)  10/07/17 (!) 431 lb (195.5 kg)     Constitutional:      Appearance: Healthy appearance. Not in distress.  Pulmonary:     Breath sounds: No wheezing. No rales.  Cardiovascular:     Normal rate. Irregularly irregular rhythm.     Murmurs: There is no murmur.  Edema:    Pretibial: bilateral trace edema of the pretibial area. Abdominal:     Palpations: Abdomen is soft.  Musculoskeletal:     Cervical back: Neck supple.     Comments: L foot wrapped Skin:    General: Skin is cool and dry.  Neurological:     General: No focal deficit present.     Mental Status: Alert and oriented to person, place and time.  Psychiatric:        Mood and Affect: Affect is tearful.        ASSESSMENT & PLAN:    1. Chronic systolic CHF (congestive heart failure) (HCC) 2. Shortness of breath EF 30-35 by echocardiogram March 2021.  Nonischemic cardiomyopathy.  NYHA IIb-IIIa.  Overall, his volume status appears stable.  His weight is actually down a couple pounds since leaving the hospital.  He remains short of breath but this seems to be related to deconditioning as well as wearing a mask.  I will obtain a follow-up BMET, BNP today.  If his BNP is significantly elevated, I will adjust his furosemide.  His blood pressure will not tolerate further adjustments in his CHF medications.  Continue current dose of carvedilol and Entresto.    3. Coronary artery disease involving native coronary artery of native heart without angina pectoris Nonobstructive coronary disease by cardiac catheterization.  He is not having angina.  He is not on aspirin as he is on Rivaroxaban.  Continue current dose of atorvastatin.  4.  Permanent atrial fibrillation Rate well controlled.  Continue current dose of carvedilol.  Continue current dose of rivaroxaban.  5. Essential hypertension The patient's blood pressure is controlled on his current regimen.  Continue current therapy.    Dispo:  Return in about 4 weeks (around 11/27/2019) for Routine Follow Up, w/ Dr. 01/27/2020, or Elease Hashimoto, PA-C, in person.   Medication Adjustments/Labs and Tests Ordered: Current medicines are reviewed at length with the patient today.  Concerns regarding medicines are outlined above.  Tests Ordered: Orders Placed This Encounter  Procedures  . Basic metabolic panel  . Pro b natriuretic peptide (BNP)  . EKG 12-Lead   Medication Changes: No orders of the defined types were placed in this encounter.   Signed, Tereso Newcomer, PA-C  10/30/2019 11:59 AM  [facility] [address], [place], [place]  [address] Phone: [phone]; Fax: [phone]

## 2019-10-30 ENCOUNTER — Ambulatory Visit: Payer: Medicare HMO | Admitting: Podiatry

## 2019-10-30 ENCOUNTER — Ambulatory Visit (INDEPENDENT_AMBULATORY_CARE_PROVIDER_SITE_OTHER): Payer: Medicare HMO | Admitting: Physician Assistant

## 2019-10-30 ENCOUNTER — Encounter: Payer: Self-pay | Admitting: Physician Assistant

## 2019-10-30 ENCOUNTER — Telehealth: Payer: Self-pay | Admitting: Licensed Clinical Social Worker

## 2019-10-30 ENCOUNTER — Other Ambulatory Visit: Payer: Self-pay

## 2019-10-30 ENCOUNTER — Ambulatory Visit (INDEPENDENT_AMBULATORY_CARE_PROVIDER_SITE_OTHER): Payer: Medicare HMO | Admitting: Podiatry

## 2019-10-30 VITALS — Temp 98.1°F

## 2019-10-30 VITALS — BP 105/68 | HR 87 | Ht 74.0 in | Wt >= 6400 oz

## 2019-10-30 DIAGNOSIS — I480 Paroxysmal atrial fibrillation: Secondary | ICD-10-CM | POA: Diagnosis not present

## 2019-10-30 DIAGNOSIS — I10 Essential (primary) hypertension: Secondary | ICD-10-CM | POA: Diagnosis not present

## 2019-10-30 DIAGNOSIS — E1142 Type 2 diabetes mellitus with diabetic polyneuropathy: Secondary | ICD-10-CM

## 2019-10-30 DIAGNOSIS — R0602 Shortness of breath: Secondary | ICD-10-CM | POA: Diagnosis not present

## 2019-10-30 DIAGNOSIS — I2510 Atherosclerotic heart disease of native coronary artery without angina pectoris: Secondary | ICD-10-CM | POA: Diagnosis not present

## 2019-10-30 DIAGNOSIS — L97529 Non-pressure chronic ulcer of other part of left foot with unspecified severity: Secondary | ICD-10-CM

## 2019-10-30 DIAGNOSIS — I5022 Chronic systolic (congestive) heart failure: Secondary | ICD-10-CM | POA: Diagnosis not present

## 2019-10-30 MED ORDER — AMOXICILLIN-POT CLAVULANATE 875-125 MG PO TABS: 1.0000 | ORAL_TABLET | Freq: Two times a day (BID) | ORAL | 0 refills | Status: DC

## 2019-10-30 NOTE — Progress Notes (Signed)
Subjective: 54 year old male presents the office today for post hospital discharge follow-up as well as for follow-up after undergoing left foot wound debridement, incision and drainage as well as graft application of the heel.  He states he is doing well he has no pain to the foot.  He states the facility that he is at has been doing better.  He has not been putting weight on his foot.  He is on the antibiotics he assumes. Denies any systemic complaints such as fevers, chills, nausea, vomiting. No acute changes since last appointment, and no other complaints at this time.   Objective: AAO x3, NAD DP/PT pulses palpable bilaterally, CRT less than 3 seconds Much improved edema to his legs.  Along the plantar aspect the left foot on the lateral midfoot wound the wound is granular and filled in about halfway.  There are sutures intact from retention sutures.  Along the plantar heel graft still intact with Acell.  There is no edema, erythema, drainage or pus.  There is no pain.  No fluctuation crepitation. No open lesions or pre-ulcerative lesions.  No pain with calf compression, swelling, warmth, erythema  Assessment: Status post left foot incision drainage, wound debridement, graft application  Plan: -All treatment options discussed with the patient including all alternatives, risks, complications.  -Clinically there is no signs of infection.  I cleaned the wounds today.  Saline wet-to-dry dressing was applied to the midfoot wound.  Adaptic was applied to the heel wound followed by dry sterile dressing.  Continue to recommend early dressing changes with similar dressing.  Continue to recommend nonweightbearing for now.  Elevation.  Finish course of antibiotics.  Discussed glucose control. -Patient encouraged to call the office with any questions, concerns, change in symptoms.   Return in about 1 week (around 11/01/2019).  [name]

## 2019-10-30 NOTE — Telephone Encounter (Signed)
CSW received referral from provider stating that patient had shared during visit that there are a lot of issues going on at the SNF where he is in rehab and would like to go to another facility. The patient is worried about going back and was sobbing in the room and visibly frightened about going back. CSW contacted patient who shared that he is staying at [facility] in [place] and states "I am in fear for my life". CSW asked about what is happening and he reports that he waits a long time when ringing the bell to use the bathroom, the mattress was falling off his bed last night and no staff responded to his needs and that his meals are not for a diabetic. CSW asked if patient he had shared his concerns with anyone at the facility and he reports that he requested to be transferred from the facility yesterday. CSW provided patient with the number for the Ombudsman Office to file a compliant and that CSW would contact the Child psychotherapist at the facility and advocate on his behalf. Patient grateful for the support and will follow up. Lasandra Beech, LCSW, CCSW-MCS [phone]

## 2019-10-30 NOTE — Telephone Encounter (Signed)
CSW received return call from [facility] Child psychotherapist at [facility] where patient is currently residing. She states that patient requested a transfer and the paperwork was completed yesterday although it takes some time to find another facility to accept patient. CSW shared concerns patient raised with his care at the facility. Facility Child psychotherapist will follow up with concerns to her Administrator and return call to CSW/Patient. CSW will return call to inform patient of conversation with facility SW. CSW available as needed. Lasandra Beech, LCSW, CCSW-MCS [phone]

## 2019-10-30 NOTE — Patient Instructions (Addendum)
Medication Instructions:   Your physician recommends that you continue on your current medications as directed. Please refer to the Current Medication list given to you today.  *If you need a refill on your cardiac medications before your next appointment, please call your pharmacy*  Lab Work:  You will have labs drawn today: BMET/BNP  If you have labs (blood work) drawn today and your tests are completely normal, you will receive your results only by: Marland Kitchen MyChart Message (if you have MyChart) OR . A paper copy in the mail If you have any lab test that is abnormal or we need to change your treatment, we will call you to review the results.  Testing/Procedures:  None ordered today  Follow-Up: At [facility], you and your health needs are our priority.  As part of our continuing mission to provide you with exceptional heart care, we have created designated Provider Care Teams.  These Care Teams include your primary Cardiologist (physician) and Advanced Practice Providers (APPs -  Physician Assistants and Nurse Practitioners) who all work together to provide you with the care you need, when you need it.  We recommend signing up for the patient portal called "MyChart".  Sign up information is provided on this After Visit Summary.  MyChart is used to connect with patients for Virtual Visits (Telemedicine).  Patients are able to view lab/test results, encounter notes, upcoming appointments, etc.  Non-urgent messages can be sent to your provider as well.   To learn more about what you can do with MyChart, go to [email or web address].    Your next appointment:    On 11/30/19 at 11:20AM with Kristeen Miss, MD

## 2019-10-31 LAB — BASIC METABOLIC PANEL
BUN/Creatinine Ratio: 26 — ABNORMAL HIGH (ref 9–20)
BUN: 29 mg/dL — ABNORMAL HIGH (ref 6–24)
CO2: 25 mmol/L (ref 20–29)
Calcium: 9 mg/dL (ref 8.7–10.2)
Chloride: 103 mmol/L (ref 96–106)
Creatinine, Ser: 1.11 mg/dL (ref 0.76–1.27)
GFR calc Af Amer: 86 mL/min/{1.73_m2} (ref >59)
GFR calc non Af Amer: 74 mL/min/{1.73_m2} (ref >59)
Glucose: 165 mg/dL — ABNORMAL HIGH (ref 65–99)
Potassium: 4.6 mmol/L (ref 3.5–5.2)
Sodium: 142 mmol/L (ref 134–144)

## 2019-10-31 LAB — PRO B NATRIURETIC PEPTIDE: NT-Pro BNP: 723 pg/mL — ABNORMAL HIGH (ref 0–210)

## 2019-11-01 ENCOUNTER — Telehealth: Payer: Self-pay | Admitting: *Deleted

## 2019-11-01 DIAGNOSIS — L97529 Non-pressure chronic ulcer of other part of left foot with unspecified severity: Secondary | ICD-10-CM

## 2019-11-01 DIAGNOSIS — I739 Peripheral vascular disease, unspecified: Secondary | ICD-10-CM

## 2019-11-01 DIAGNOSIS — E1142 Type 2 diabetes mellitus with diabetic polyneuropathy: Secondary | ICD-10-CM

## 2019-11-01 NOTE — Progress Notes (Signed)
Subjective: 54 year old male presents the office today for post hospital discharge follow-up as well as for follow-up after undergoing left foot wound debridement, incision and drainage as well as graft application of the heel.  He is concerned that the facility where he is currently residing is not wrapping his foot correctly and he is unhappy with the services.  He has not been eating because of the food they have been providing.  Otherwise from a foot standpoint he has been feeling well.  No significant pain.  Has not seen any increase in swelling or redness.  He is not sure what medications he is taking. Denies any systemic complaints such as fevers, chills, nausea, vomiting. No acute changes since last appointment, and no other complaints at this time.   Objective: AAO x3, NAD DP/PT pulses palpable bilaterally, CRT less than 3 seconds Edema continues to be improved to the legs.  Graft is present to the plantar heel as well as on the midfoot wound granulation tissue is present.  That has filled in with granulation tissue a little more than halfway today.  There is no drainage or pus.  No edema, erythema to the foot and there is no warmth around the wounds. No open lesions or pre-ulcerative lesions.  No pain with calf compression, swelling, warmth, erythema  Assessment: Status post left foot incision drainage, wound debridement, graft application  Plan: -All treatment options discussed with the patient including all alternatives, risks, complications.  -Continue daily dressing changes.  For the heel wound Adaptic followed by petroleum jelly.  For the midfoot wound saline wet-to-dry followed by a dry sterile dressing. -Continue to recommend nonweightbearing.  Given his weight and his heel wound if he puts too much weight on the foot is clinically healing.  I like for the wound to be healed more before starting weightbearing.  Hopefully next week we can start weightbearing for short amount of time  for transition. -He is awaiting to hear back from [facility].  Also there is nothing I can do to help transfer facilities.  Return in about 1 week (around 11/06/2019).  [name]

## 2019-11-01 NOTE — Telephone Encounter (Signed)
I spoke with [facility] - Darel Hong, she states admission coordinator should be contact around 8:45am prior to staff meeting and will out of her office and around the compound until about 1:00pm.

## 2019-11-01 NOTE — Telephone Encounter (Signed)
-----   Message from Vivi Barrack, DPM sent at 11/01/2019  7:35 AM EDT ----- This patient is currently at [facility] in [place] but feels that he is not getting the care he needs. He has called [facility] to see about transferring. He was asking if there was anything we can do. Could you call [facility] to see if there is even an opening? If there is something that I can do to help, let me know. I would be happy to complete the paperwork if there is anything. Thanks.

## 2019-11-01 NOTE — Telephone Encounter (Signed)
I called [facility] and let the [phone] ring for over a minute and there was no answer.

## 2019-11-01 NOTE — Telephone Encounter (Signed)
Left message informing [facility] - Lajean Manes, Admissions Coordinator and Marketing, that I was looking for information to have pt transferred from his current rehab facility to [place].

## 2019-11-02 NOTE — Telephone Encounter (Signed)
Maurice Nguyen transferred to Maurice Nguyen, and I was transferred to [facility], I left a message informing Social Service I was needing to speak with someone concerning our pt being transferred from his current facility to [place].

## 2019-11-02 NOTE — Telephone Encounter (Signed)
Faxed required form, SnapShot with clinicals and Medicare card copy to [facility].

## 2019-11-02 NOTE — Telephone Encounter (Signed)
[facility] - Lajean Manes called and I informed that pt was being discharged to home.

## 2019-11-02 NOTE — Telephone Encounter (Signed)
I informed pt, I had been trying get a call back from [place] after multiple calls. Pt states he is going to be discharged from [facility] and they have not helped him with a walker, bedside commode that converts to a shower chair and he wants a cane, and he doesn't have HHC and he wants to know when his appt is on Monday and when would all of this be done. I told pt I would place the orders for the equipment and send the request HHC with agencies that accept his insurance. Pt wanted to know when all of that would be done and I told him would work on it as soon as I got off the phone.

## 2019-11-02 NOTE — Telephone Encounter (Signed)
For the heel it is adaptic with petroleum jelly and for the midfoot wound it is a saline wet to dry. Cover both with a dry sterile dressing.   Are they not going to set anything up for him upon discharge???

## 2019-11-02 NOTE — Telephone Encounter (Signed)
Thank you :)

## 2019-11-02 NOTE — Telephone Encounter (Signed)
Faxed required form, clinicals and demographics to [facility] with orders of 11/02/2019.

## 2019-11-02 NOTE — Telephone Encounter (Signed)
[facility] - Vernona Rieger states they do not have staffing for pt.

## 2019-11-02 NOTE — Telephone Encounter (Signed)
Faxed required form, clinicals and demographics to [facility]. 

## 2019-11-02 NOTE — Telephone Encounter (Signed)
Thanks. Will you please just let the patient know that we are trying.

## 2019-11-04 DIAGNOSIS — L97529 Non-pressure chronic ulcer of other part of left foot with unspecified severity: Secondary | ICD-10-CM

## 2019-11-04 DIAGNOSIS — E1142 Type 2 diabetes mellitus with diabetic polyneuropathy: Secondary | ICD-10-CM

## 2019-11-05 ENCOUNTER — Ambulatory Visit: Payer: Medicare HMO | Admitting: Podiatry

## 2019-11-05 ENCOUNTER — Ambulatory Visit (INDEPENDENT_AMBULATORY_CARE_PROVIDER_SITE_OTHER): Payer: Medicare HMO | Admitting: Podiatry

## 2019-11-05 ENCOUNTER — Other Ambulatory Visit: Payer: Self-pay

## 2019-11-05 ENCOUNTER — Telehealth: Payer: Self-pay

## 2019-11-05 DIAGNOSIS — E1142 Type 2 diabetes mellitus with diabetic polyneuropathy: Secondary | ICD-10-CM

## 2019-11-05 DIAGNOSIS — L97529 Non-pressure chronic ulcer of other part of left foot with unspecified severity: Secondary | ICD-10-CM

## 2019-11-05 NOTE — Telephone Encounter (Signed)
Maurice Nguyen from [place] [facility] left a message. They went to the patient's home on Sunday 11/04/2019. She did not have the Adaptic that was ordered for dressing change, so she used iodoform gauze instead. Was this OK?  She has the order for the adaptic, but she needs an order for the iodoform to cover the visit on Sunday. Please fax the order to [phone] Patient has office visit today

## 2019-11-06 NOTE — Telephone Encounter (Signed)
That is OK to send over the order for Sunday.   I have called Kathie Rhodes myself yesterday and gave verbal orders for the dressing change. She will do it M,W,F. He  is going to come in on T,Th for right now for dressing changes as he cannot do it himself.

## 2019-11-08 ENCOUNTER — Ambulatory Visit (INDEPENDENT_AMBULATORY_CARE_PROVIDER_SITE_OTHER): Payer: Medicare HMO | Admitting: Podiatry

## 2019-11-08 ENCOUNTER — Other Ambulatory Visit: Payer: Self-pay

## 2019-11-08 DIAGNOSIS — L97529 Non-pressure chronic ulcer of other part of left foot with unspecified severity: Secondary | ICD-10-CM

## 2019-11-08 DIAGNOSIS — E1142 Type 2 diabetes mellitus with diabetic polyneuropathy: Secondary | ICD-10-CM

## 2019-11-12 NOTE — Progress Notes (Signed)
Subjective: 54 year old male presents the office today for dressing change to his left foot.  Home health is been changing the bandage every other day he presents today to have the nurse change the dressing. Denies any fevers, chills, nausea, vomiting.  No calf pain, chest pain, shortness of breath.  Objective: AAO x3, NAD Neurovascular status unchanged.  Crepitus present to the heel which is starting to incorporate more this granulation tissue present along the periphery.  Wound plantar lateral midfoot is about the same as when I saw him earlier in the week.  There is no drainage or pus.  No surrounding erythema.  There is some macerated tissue present today to the plantar aspect of the foot. No open lesions or pre-ulcerative lesions.  No pain with calf compression, swelling, warmth, erythema  Assessment: Status post left foot incision drainage, wound debridement, graft application-improving  Plan: -All treatment options discussed with the patient including all alternatives, risks, complications -Wounds were cleaned today with saline and the dressings were reapplied. For the heel wound Adaptic followed by petroleum jelly.  For the midfoot wound saline wet-to-dry followed by a dry sterile dressing.  Follow-up as scheduled or sooner if needed  [name]

## 2019-11-12 NOTE — Progress Notes (Signed)
Subjective: 54 year old male presents the office today for follow-up evaluation of wounds to the bottom of his left foot.  Since I last saw him he has been discharged from rehab and is back at home.  He presents today in a wheelchair with his wife.  He has no pain in the foot denies any increase in swelling or redness.  Denies any fevers, chills, nausea, vomiting.  No calf pain, chest pain, shortness of breath.  Objective: AAO x3, NAD DP/PT pulses palpable bilaterally, CRT less than 3 seconds Much improved edema to bilateral legs.  Crepitus still present the plantar heel and appears to be incorporating more.  Around the graft there is granulation tissue present.  On the midfoot lateral aspect wound continues to appear to be about the same size is more granular.  There is significant dry skin present the bottom of the foot which is able to debride and there is no ongoing ulceration identified otherwise. No open lesions or pre-ulcerative lesions.  No pain with calf compression, swelling, warmth, erythema  Assessment: Status post left foot incision drainage, wound debridement, graft application-improving  Plan: -All treatment options discussed with the patient including all alternatives, risks, complications.  -Continue daily dressing changes.  For the heel wound Adaptic followed by petroleum jelly.  For the midfoot wound saline wet-to-dry followed by a dry sterile dressing. -Cam boot was dispensed.  Encouraged nonweightbearing is much as possible but now that he is home with a boot and weight-bear as tolerated for the transitions only.  Encouraged elevation.  I spoke to his home health nurse and gave verbal orders for dressing changes.  They will do them 3 times a week and, the office 2 days a week for right now  Return in about 1 week (around 11/12/2019).  [name]

## 2019-11-13 ENCOUNTER — Ambulatory Visit (INDEPENDENT_AMBULATORY_CARE_PROVIDER_SITE_OTHER): Payer: Medicare HMO | Admitting: Podiatry

## 2019-11-13 ENCOUNTER — Other Ambulatory Visit: Payer: Self-pay

## 2019-11-13 DIAGNOSIS — L97529 Non-pressure chronic ulcer of other part of left foot with unspecified severity: Secondary | ICD-10-CM

## 2019-11-13 DIAGNOSIS — E1142 Type 2 diabetes mellitus with diabetic polyneuropathy: Secondary | ICD-10-CM

## 2019-11-14 NOTE — Progress Notes (Addendum)
Subjective: 54 year old male presents the office today for follow-up evaluation of wounds to the bottom of his left foot.  He states he is doing well he denies any pus or increase in swelling or redness of the foot.  He has no new concerns today. Denies any fevers, chills, nausea, vomiting.  No calf pain, chest pain, shortness of breath.  Objective: AAO x3, NAD Neurovascular status unchanged.   The graft is incorporated to the heel.  This granulation tissue present with small meta fibrotic tissue.  Also granular tissue present along the midfoot wound.  Today the wounds measurements are as follows  Midfoot lateral ulcer: 7 x 2 x 0.5 cm Heel: 4 x 3.8 x 0.5 cm. Neither the wounds probe to bone.  There is no exposed tendon.  There is no surrounding erythema, ascending cellulitis but there is no fluctuance or crepitation but there is no malodor.  No purulence.  Small amount of bloody drainage on the bandage. No open lesions or pre-ulcerative lesions.  No pain with calf compression, swelling, warmth, erythema  Assessment: Status post left foot incision drainage, wound debridement, graft application-improving  Plan: -All treatment options discussed with the patient including all alternatives, risks, complications -Lightly debrided the wounds today.  Measurements are above.  Continue limit weightbearing use cam boot for offloading.  Given the nature of the wounds and he is at high risk of amputation if the wounds do not heal I do think that grafting will be beneficial for the patient.  We will check insurance coverage for him. -Continue wet-to-dry dressing changes for now. -Monitor for any clinical signs or symptoms of infection and directed to call the office immediately should any occur or go to the ER.  No follow-ups on file.  [name]

## 2019-11-15 ENCOUNTER — Other Ambulatory Visit: Payer: Self-pay

## 2019-11-15 ENCOUNTER — Ambulatory Visit (INDEPENDENT_AMBULATORY_CARE_PROVIDER_SITE_OTHER): Payer: Medicare HMO | Admitting: Podiatry

## 2019-11-15 DIAGNOSIS — L97529 Non-pressure chronic ulcer of other part of left foot with unspecified severity: Secondary | ICD-10-CM

## 2019-11-15 DIAGNOSIS — E1142 Type 2 diabetes mellitus with diabetic polyneuropathy: Secondary | ICD-10-CM

## 2019-11-15 MED ORDER — AMPICILLIN 500 MG PO CAPS: 500.0000 mg | ORAL_CAPSULE | Freq: Four times a day (QID) | ORAL | 0 refills | Status: DC

## 2019-11-20 ENCOUNTER — Other Ambulatory Visit: Payer: Self-pay

## 2019-11-20 ENCOUNTER — Ambulatory Visit (INDEPENDENT_AMBULATORY_CARE_PROVIDER_SITE_OTHER): Payer: Medicare HMO | Admitting: Podiatry

## 2019-11-20 ENCOUNTER — Encounter: Payer: Self-pay | Admitting: Sports Medicine

## 2019-11-20 DIAGNOSIS — E1142 Type 2 diabetes mellitus with diabetic polyneuropathy: Secondary | ICD-10-CM

## 2019-11-20 DIAGNOSIS — L97529 Non-pressure chronic ulcer of other part of left foot with unspecified severity: Secondary | ICD-10-CM | POA: Diagnosis not present

## 2019-11-21 ENCOUNTER — Encounter: Payer: Self-pay | Admitting: Podiatry

## 2019-11-21 NOTE — Progress Notes (Signed)
Subjective:  Patient ID: Maurice Nguyen, male    DOB: 1965/12/08,  MRN: [id number]  Maurice Nguyen is a diabetic patient with h/o Charcot and  is seen for follow up of ulceration to the lateral aspect left midfoot and plantar aspect left heel. Family member present during the visit. She states home health nurse concerned about tissue on heel wound which has green tinge to it.   Maurice Nguyen denies any fever, chills, night sweats, nausea or vomiting.   Wound location: left lateral midfoot and plantar aspect left heel.  Past Medical History:  Diagnosis Date  . A-fib (HCC)   . Anticoagulated, on Xarelto, Chads2Vas score 2 10/02/2013  . Back pain, chronic 07/13/2012  . CHF (congestive heart failure) (HCC)   . 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 CKD (chronic kidney disease), stage II   . Complication of anesthesia    " THEY LOST ME AND BROUGHT ME BACK "  . Hypertension 07/13/2012  . Irregular heart beat   . Lumbar herniated disc   . Morbid obesity (HCC)   . Obesity, morbid, BMI 50 or higher (HCC) 07/13/2012  . 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 Renal insufficiency 10/08/2019  . Shortness of breath   . Type II diabetes mellitus (HCC) 07/13/2012     Past Surgical History:  Procedure Laterality Date  . ABSCESS DRAINAGE    . COLONOSCOPY  08/22/2012   Procedure: COLONOSCOPY;  Surgeon: Shirley Friar, MD;  Location: [facility];  Service: Endoscopy;  Laterality: N/A;  . CORONARY STENT PLACEMENT Left 10/11/2019   Procedure: left heart cath and coronary angiography;  Surgeon: Kathleene Hazel, MD;  Location: [facility] OR;  Service: Cardiovascular;  Laterality: Left;  . DENTAL SURGERY    . LOWER EXTREMITY ANGIOGRAM Bilateral 10/11/2019   Procedure: LOWER EXTREMITY ANGIOGRAM;  Surgeon: Maeola Harman, MD;  Location: [facility] OR;  Service: Vascular;  Laterality: Bilateral;  . WOUND DEBRIDEMENT Left 10/10/2019   Procedure: DEBRIDEMENT WOUND;  Surgeon:  Vivi Barrack, DPM;  Location: [facility] OR;  Service: Podiatry;  Laterality: Left;  . WOUND DEBRIDEMENT Left 10/15/2019   Procedure: DEBRIDEMENT WOUND, POSSIBLE WOUND VAC APPLICATION;  Surgeon: Vivi Barrack, DPM;  Location: [facility] OR;  Service: Podiatry;  Laterality: Left;     Current Outpatient Medications on File Prior to Visit  Medication Sig Dispense Refill  . acetaminophen (TYLENOL) 500 MG tablet Take 2 tablets (1,000 mg total) by mouth every 8 (eight) hours. 180 tablet 1  . amoxicillin-clavulanate (AUGMENTIN) 875-125 MG tablet Take 1 tablet by mouth 2 (two) times daily. 20 tablet 0  . ampicillin (PRINCIPEN) 500 MG capsule Take 1 capsule (500 mg total) by mouth 4 (four) times daily. 40 capsule 0  . atorvastatin (LIPITOR) 80 MG tablet Take 1 tablet (80 mg total) by mouth daily at 6 PM.    . benzonatate (TESSALON) 200 MG capsule Take 1 capsule (200 mg total) by mouth 3 (three) times daily as needed for cough. 20 capsule 0  . carvedilol (COREG) 12.5 MG tablet Take 1 tablet (12.5 mg total) by mouth 2 (two) times daily with a meal.    . empagliflozin (JARDIANCE) 10 MG TABS tablet Take 10 mg by mouth daily before breakfast. 90 tablet 1  . Ensure Max Protein (ENSURE MAX PROTEIN) LIQD Take 330 mLs (11 oz total) by mouth 2 (two) times daily.    . furosemide (LASIX) 80 MG tablet Take 1 tablet (80 mg total) by  mouth 2 (two) times daily.    Marland Kitchen gabapentin (NEURONTIN) 300 MG capsule Take 1 capsule (300 mg total) by mouth 2 (two) times daily.  2  . insulin aspart (NOVOLOG) 100 UNIT/ML injection Inject 0-20 Units into the skin 3 (three) times daily with meals. CBG 70 - 120: 0 units CBG 121 - 150: 3 units CBG 151 - 200: 4 units CBG 201 - 250: 7 units CBG 251 - 300: 11 units CBG 301 - 350: 15 units CBG 351 - 400: 20 units CBG > 400: call MD and obtain STAT lab verification 10 mL 11  . insulin NPH Human (NOVOLIN N) 100 UNIT/ML injection Inject 0.35 mLs (35 Units total) into the skin 2 (two) times daily at  8 am and 10 pm. 10 mL 11  . metFORMIN (GLUCOPHAGE-XR) 500 MG 24 hr tablet Take 2 tablets (1,000 mg total) by mouth in the morning and at bedtime.    . Multiple Vitamin (MULTIVITAMIN WITH MINERALS) TABS tablet Take 1 tablet by mouth daily.    . nitroGLYCERIN (NITROSTAT) 0.4 MG SL tablet Place 1 tablet (0.4 mg total) under the tongue every 5 (five) minutes x 3 doses as needed for chest pain. 30 tablet 12  . polyethylene glycol (MIRALAX / GLYCOLAX) 17 g packet Take 17 g by mouth 2 (two) times daily as needed for mild constipation. 14 each 0  . rivaroxaban (XARELTO) 20 MG TABS tablet Take 1 tablet (20 mg total) by mouth daily with supper. 30 tablet   . saccharomyces boulardii (FLORASTOR) 250 MG capsule Take 1 capsule (250 mg total) by mouth 2 (two) times daily.    . sacubitril-valsartan (ENTRESTO) 24-26 MG Take 1 tablet by mouth 2 (two) times daily. 60 tablet   . Vitamin D, Ergocalciferol, (DRISDOL) 1.25 MG (50000 UNIT) CAPS capsule Take 1 capsule (50,000 Units total) by mouth every 7 (seven) days. 7 capsule    No current facility-administered medications on file prior to visit.     No Known Allergies   Family History  Problem Relation Age of Onset  . Heart disease Neg Hx      Social History   Occupational History  . Occupation: N/A  Tobacco Use  . Smoking status: Never Smoker  . Smokeless tobacco: Never Used  Substance and Sexual Activity  . Alcohol use: No    Comment: rarely  . Drug use: No  . Sexual activity: Yes     Immunization History  Administered Date(s) Administered  . Pneumococcal Polysaccharide-23 10/07/2015  . Tdap 03/30/2016     Review of systems: Positive Findings in bold print.  Constitutional:  chills, fatigue, fever, sweats, weight change Communication: Nurse, learning disability, sign Presenter, broadcasting, hand writing, iPad/Android device Head: head injury Eyes: changes in vision, eye pain, glaucoma, cataracts, macular degeneration, diplopia, glare, light sensitivity,  eyeglasses or contacts, blindness Ears nose mouth throat: hearing impaired, hearing aids,  ringing in ears, deaf, sign language,  vertigo, nosebleeds,  rhinitis,  cold sores, snoring, swollen glands Cardiovascular: HTN, edema, arrhythmia, pacemaker in place, defibrillator in place, chest pain/tightness, chronic anticoagulation, blood clot, heart failure, MI Peripheral Vascular: leg cramps, varicose veins, blood clots, lymphedema, varicosities Respiratory:  asthma, difficulty breathing, denies congestion, SOB, wheezing, cough, emphysema, oxygen dependent Gastrointestinal: change in appetite or weight, abdominal pain, constipation, diarrhea, nausea, vomiting, vomiting blood, change in bowel habits, abdominal pain, jaundice, rectal bleeding, hemorrhoids, GERD Genitourinary:  nocturia,  pain on urination, polyuria,  blood in urine, Foley catheter, urinary urgency, CKD, ESRD on hemodialysis Musculoskeletal:  amputation(s), cramping, stiff joints, painful joints, decreased joint motion, fractures, OA, gout, hemiplegia, paraplegia, uses cane, wheelchair bound, uses walker, uses rollator Skin: +changes in toenails, color change, dryness, itching, mole changes,  rash, wound(s) Neurological: headaches, numbness in feet, paresthesias in feet, burning in feet, fainting,  seizures, change in speech. denies headaches, memory problems/poor historian, cerebral palsy, weakness, paralysis, CVA, TIA, tremors Endocrine: diabetes, hypothyroidism, hyperthyroidism,  goiter, dry mouth, flushing, heat intolerance,  cold intolerance,  excessive thirst, denies polyuria,  nocturia Hematological:  easy bleeding, excessive bleeding, easy bruising, enlarged lymph nodes, on long term blood thinner, history of past transusions Allergy/immunological:  hives, eczema, frequent infections, multiple drug allergies, seasonal allergies, transplant recipient, multiple food allergies Psychiatric:  anxiety, depression, mood disorder, suicidal  ideations, hallucinations, insomnia   Objective:  Physical Exam: There were no vitals filed for this visit.   54 y.o.pleasant AA male morbidly obese, in NAD. AAO x 3.  Capillary fill time to digits <3 seconds b/l. Palpable DP pulses b/l. Palpable PT pulses b/l. Pedal hair absent b/l Skin temperature gradient within normal limits b/l. Nonpitting edema noted b/l LE.  Pedal skin with normal turgor, texture and tone bilaterally. No interdigital macerations bilaterally.   Ulceration #1: Ulceration located plantar aspect left heel. Predebridement measurements carried out today of 4.5 x 4.0 x 0.4 cm.  No periulcerative erythema, no edema, no drainage.  No flocculence, no malodor.  Postdebridement measurements are 4.5 x 4.0 x 0.4 cm cm. Postdebridement, there is no undermining, no tunneling, no visible joint or bone exposure, no probing to bone, no odor. Base of ulcer is noted to be granular with small area of devitalized fibrotic tissue at 9 o'clock position. Fibrous tissue has greenish tinge to it. Wound Base: Fibrogranular/Healthy with slight greenish tinge to fibrous tissue Peri-wound: Normal Exudate: None: wound tissue dry Blood Loss during debridement: 0 cc's. Description of tissue removed from ulceration today:  Fibrotic Signs of clinical infection are absent. Material in wound which inhibits healing/promotes adjacent tissue breakdown:  Fibrotic tissue Foot is  without warmth, erythema, or signs of acute infection.  Ulceration #2: Wound Location: lateral aspect left midfoot Devitalized tissue absent. Wound Measurement: 4.0 x 3.0 x 0.5 cm Postdebridement Wound Measurement: Wound did not require debridement on today's visit. Wound Base: Granular/Healthy Peri-wound: Normal Exudate: None: wound tissue dry Blood Loss during debridement: 0 cc's. Description of tissue removed from ulceration today:  None. Signs of clinical infection are absent. Material in wound which inhibits  healing/promotes adjacent tissue breakdown:  None. wound without warmth, erythema, signs of acute infection and appears improved compared to last recheck.  Normal muscle strength 5/5 to all lower extremity muscle groups bilaterally. No pain crepitus or joint limitation noted with ROM b/l. Charcot deformity noted plantar aspect left heel.  Protective sensation diminished with 10g monofilament b/l. Vibratory sensation diminished b/l. Babinski reflex negative b/l. Clonus negative b/l.  Hemoglobin A1C Latest Ref Rng & Units 10/08/2019  HGBA1C 4.8 - 5.6 % 11.2(H)  Some recent data might be hidden    Lab Results  Component Value Date   HGBA1C 11.2 (H) 10/08/2019     Assessment:   1. Ulcer of left foot, unspecified ulcer stage (HCC)   2. Diabetic peripheral neuropathy associated with type 2 diabetes mellitus (HCC)     Plan:  -Patient was evaluated and treated and all questions answered. Patient educated on diagnosis and treatment plan of routine ulcer debridement/wound care.  -Left heel ulcer debrided of fibrotic tissue left heel utilizing:  sterile scalpel blade. -Amount of devitalized tissue removed: small amount of fibrotic tissue -Today's ulcer size left heel post-debridement: 4.5 x 4.0 x 0.4 cm. -Left lateral foot ulcer not debrided today.  -Both ulcers cleansed with wound cleanser.  -Wound responded favorably  to today's debridement. -Suspect wound is too moist and is contaminated with Pseudomonas. Discontinue wet to dry dressings for now. Start Iodosorb Gel dressings to both ulcers until re-evaluated by Dr. [name]. Verbal orders given to [facility] as she is to visit Maurice Nguyen on tomorrow. -Patient to report any pedal injuries to medical professional immediately. -Patient to continue [place]. Nurse to visit on tomorrow.  -Patient/POA to call should there be question/concern in the interim. -Patient risk factors affecting healing of ulcer: uncontrolled diabetes, neuropathy,  morbid obesity, CHF, hyperlipidemia. -Frequency of debridement needed to achieve healing: weekly -Maurice Nguyen will follow up with Dr. [name] on this Thursday, November 22, 2019.

## 2019-11-22 ENCOUNTER — Other Ambulatory Visit: Payer: Self-pay

## 2019-11-22 ENCOUNTER — Encounter: Payer: Self-pay | Admitting: Podiatry

## 2019-11-22 ENCOUNTER — Ambulatory Visit (INDEPENDENT_AMBULATORY_CARE_PROVIDER_SITE_OTHER): Payer: Medicare HMO | Admitting: Podiatry

## 2019-11-22 DIAGNOSIS — L97529 Non-pressure chronic ulcer of other part of left foot with unspecified severity: Secondary | ICD-10-CM

## 2019-11-22 DIAGNOSIS — E1142 Type 2 diabetes mellitus with diabetic polyneuropathy: Secondary | ICD-10-CM

## 2019-11-27 ENCOUNTER — Other Ambulatory Visit: Payer: Self-pay

## 2019-11-27 ENCOUNTER — Telehealth: Payer: Self-pay | Admitting: *Deleted

## 2019-11-27 ENCOUNTER — Ambulatory Visit (INDEPENDENT_AMBULATORY_CARE_PROVIDER_SITE_OTHER): Payer: Medicare HMO | Admitting: Podiatry

## 2019-11-27 DIAGNOSIS — E1142 Type 2 diabetes mellitus with diabetic polyneuropathy: Secondary | ICD-10-CM

## 2019-11-27 DIAGNOSIS — L97529 Non-pressure chronic ulcer of other part of left foot with unspecified severity: Secondary | ICD-10-CM

## 2019-11-27 NOTE — Telephone Encounter (Signed)
Thanks. I think his wife is changing it on the weekend

## 2019-11-27 NOTE — Telephone Encounter (Signed)
They are coming in today and I will discuss with them .

## 2019-11-27 NOTE — Progress Notes (Signed)
Subjective: 54 year old male presents the office today for follow-up evaluation of wounds to the bottom of his left foot.  States he has been doing well.  No significant pain.  Home health is continue to change the bandage every other day. Denies any pus or increase in swelling or redness of the foot.  He has no new concerns today. Denies any fevers, chills, nausea, vomiting.  No calf pain, chest pain, shortness of breath.  Objective: AAO x3, NAD Neurovascular status unchanged.   The graft is incorporated to the heel.  This granulation tissue present with small amounts of fibrotic tissue.  Also granular tissue present along the midfoot wound.  Today the wounds measurements are as follows  Midfoot lateral ulcer: 6 x 1.5 x 0.3 cm Heel: 4 x 3.5 x 0.5 cm. Neither the wounds probe to bone.  There is no exposed tendon.  There is no surrounding erythema, ascending cellulitis but there is no fluctuance or crepitation but there is no malodor.  No purulence.  Small amount of bloody drainage on the bandage. No open lesions or pre-ulcerative lesions.  No pain with calf compression, swelling, warmth, erythema  Assessment: Status post left foot incision drainage, wound debridement, graft application-improving  Plan: -All treatment options discussed with the patient including all alternatives, risks, complications -Sharply debrided the wounds today.  Measurements are above.  Continue limit weightbearing use cam boot for offloading.  Given the nature of the wounds and he is at high risk of amputation if the wounds do not heal I do think that grafting will be beneficial for the patient.  We will check insurance coverage for him. -Continue wet-to-dry dressing changes for now. -Monitor for any clinical signs or symptoms of infection and directed to call the office immediately should any occur or go to the ER.  No follow-ups on file.  [name]

## 2019-11-27 NOTE — Progress Notes (Signed)
Subjective: 54 year old male presents the office today for follow-up evaluation of wounds to the bottom of his left foot.  Home health continue to change the bandage every other day with saline wet-to-dry dressing.  There has been some drainage on the bandage.  Denies any fevers, chills, nausea, vomiting.  No calf pain, chest pain or shortness of breath.    Objective: AAO x3, NAD Neurovascular status unchanged.   The graft is incorporated to the heel.  There is granulation tissue present to both of the ulcerations and there is no probing to bone, undermining or tunneling.  There is no surrounding erythema, ascending cellulitis.  No fluctuation crepitation.  Is malodor.  After debridement measurements Midfoot lateral ulcer: 6.8 x 2 x 0.3 cm Heel: 4 x 3.5 x 0.5 cm. Neither the wounds probe to bone.  There is no exposed tendon.  There is no surrounding erythema, ascending cellulitis but there is no fluctuance or crepitation but there is no malodor.  No purulence.  Small amount of bloody drainage on the bandage. No open lesions or pre-ulcerative lesions.  No pain with calf compression, swelling, warmth, erythema  Assessment: Status post left foot incision drainage, wound debridement, graft application-improving  Plan: -All treatment options discussed with the patient including all alternatives, risks, complications -Lightly debrided the wounds today.  Measurements are above.  Recommend continue limit weightbearing.  Continue with Santyl with a dry dressing changes for now.  Discussed using left saline to help dry the area it does not need to moist macerated. -Due to the drainage will restart antibiotics.  Prescribed ampicillin. -Elevation.. -Monitor for any clinical signs or symptoms of infection and directed to call the office immediately should any occur or go to the ER.  No follow-ups on file.  [name]

## 2019-11-27 NOTE — Telephone Encounter (Signed)
[facility] called to inform Dr. Ardelle Anton, pt's dressing had not been changed since 11/23/2019 in-office.

## 2019-11-27 NOTE — Telephone Encounter (Signed)
As of message 11/05/2019 Maurice Nguyen will only be able to perform [facility] Monday, Wednesday and Friday, pt will come to office Tuesday, and Thursday.

## 2019-11-27 NOTE — Telephone Encounter (Signed)
Are they not coming out to change it???

## 2019-11-29 ENCOUNTER — Encounter: Payer: Self-pay | Admitting: Podiatry

## 2019-11-29 ENCOUNTER — Ambulatory Visit (INDEPENDENT_AMBULATORY_CARE_PROVIDER_SITE_OTHER): Payer: Medicare HMO | Admitting: Podiatry

## 2019-11-29 ENCOUNTER — Other Ambulatory Visit: Payer: Self-pay

## 2019-11-29 DIAGNOSIS — E1142 Type 2 diabetes mellitus with diabetic polyneuropathy: Secondary | ICD-10-CM | POA: Diagnosis not present

## 2019-11-29 DIAGNOSIS — L97529 Non-pressure chronic ulcer of other part of left foot with unspecified severity: Secondary | ICD-10-CM | POA: Diagnosis not present

## 2019-11-29 NOTE — Patient Instructions (Signed)
Pre-Operative Instructions  Congratulations, you have decided to take an important step towards improving your quality of life.  You can be assured that the doctors and staff at [facility] will be with you every step of the way.  Here are some important things you should know:  1. Plan to be at the surgery center/hospital at least 1 (one) hour prior to your scheduled time, unless otherwise directed by the surgical center/hospital staff.  You must have a responsible adult accompany you, remain during the surgery and drive you home.  Make sure you have directions to the surgical center/hospital to ensure you arrive on time. 2. If you are having surgery at [facility] or  hospitals, you will need a copy of your medical history and physical form from your family physician within one month prior to the date of surgery. We will give you a form for your primary physician to complete.  3. We make every effort to accommodate the date you request for surgery.  However, there are times where surgery dates or times have to be moved.  We will contact you as soon as possible if a change in schedule is required.   4. No aspirin/ibuprofen for one week before surgery.  If you are on aspirin, any non-steroidal anti-inflammatory medications (Mobic, Aleve, Ibuprofen) should not be taken seven (7) days prior to your surgery.  You make take Tylenol for pain prior to surgery.  5. Medications - If you are taking daily heart and blood pressure medications, seizure, reflux, allergy, asthma, anxiety, pain or diabetes medications, make sure you notify the surgery center/hospital before the day of surgery so they can tell you which medications you should take or avoid the day of surgery. 6. No food or drink after midnight the night before surgery unless directed otherwise by surgical center/hospital staff. 7. No alcoholic beverages 24-hours prior to surgery.  No smoking 24-hours prior or 24-hours after  surgery. 8. Wear loose pants or shorts. They should be loose enough to fit over bandages, boots, and casts. 9. Don't wear slip-on shoes. Sneakers are preferred. 10. Bring your boot with you to the surgery center/hospital.  Also bring crutches or a walker if your physician has prescribed it for you.  If you do not have this equipment, it will be provided for you after surgery. 11. If you have not been contacted by the surgery center/hospital by the day before your surgery, call to confirm the date and time of your surgery. 12. Leave-time from work may vary depending on the type of surgery you have.  Appropriate arrangements should be made prior to surgery with your employer. 13. Prescriptions will be provided immediately following surgery by your doctor.  Fill these as soon as possible after surgery and take the medication as directed. Pain medications will not be refilled on weekends and must be approved by the doctor. 14. Remove nail polish on the operative foot and avoid getting pedicures prior to surgery. 15. Wash the night before surgery.  The night before surgery wash the foot and leg well with water and the antibacterial soap provided. Be sure to pay special attention to beneath the toenails and in between the toes.  Wash for at least three (3) minutes. Rinse thoroughly with water and dry well with a towel.  Perform this wash unless told not to do so by your physician.  Enclosed: 1 Ice pack (please put in freezer the night before surgery)   1 Hibiclens skin cleaner     Pre-op instructions  If you have any questions regarding the instructions, please do not hesitate to call our office.  [place]: [address], [place], [place] [address] -- [phone]  [place]: [address] [place], [place] [address] -- [phone]  [place]: [address], [place], [place] [address] -- [phone]   Website: [email or web address] 

## 2019-11-30 ENCOUNTER — Ambulatory Visit (INDEPENDENT_AMBULATORY_CARE_PROVIDER_SITE_OTHER): Payer: Medicare HMO | Admitting: Cardiovascular Disease

## 2019-11-30 ENCOUNTER — Encounter: Payer: Self-pay | Admitting: Cardiovascular Disease

## 2019-11-30 DIAGNOSIS — Z5329 Procedure and treatment not carried out because of patient's decision for other reasons: Secondary | ICD-10-CM

## 2019-11-30 DIAGNOSIS — Z91199 Patient's noncompliance with other medical treatment and regimen due to unspecified reason: Secondary | ICD-10-CM | POA: Insufficient documentation

## 2019-11-30 NOTE — Progress Notes (Signed)
No show

## 2019-12-03 ENCOUNTER — Other Ambulatory Visit (HOSPITAL_COMMUNITY)
Admission: RE | Admit: 2019-12-03 | Discharge: 2019-12-03 | Disposition: A | Discharge status: Home / Self Care | Payer: Medicare HMO | Source: Ambulatory Visit | Attending: Podiatry | Admitting: Podiatry

## 2019-12-03 DIAGNOSIS — Z20822 Contact with and (suspected) exposure to covid-19: Secondary | ICD-10-CM | POA: Insufficient documentation

## 2019-12-03 DIAGNOSIS — Z01812 Encounter for preprocedural laboratory examination: Secondary | ICD-10-CM | POA: Diagnosis present

## 2019-12-03 LAB — SARS CORONAVIRUS 2 (TAT 6-24 HRS): SARS Coronavirus 2: NEGATIVE

## 2019-12-04 MED ORDER — DEXTROSE 5 % IV SOLN
3.0000 g | INTRAVENOUS | Status: AC
  Administered 2019-12-05: 3 g via INTRAVENOUS
  Filled 2019-12-04: qty 3000
  Filled 2019-12-04: qty 3

## 2019-12-04 NOTE — Progress Notes (Signed)
Anesthesia Chart Review:  Follows with cardiology for hx of combined systolic and diastolic CHF, NICM (cath 10/13/2019: Mild nonobstructive disease except for severe disease in a small caliber PL branch-med Rx), paroxysmal A. fib on Xarelto, hypertension, PAD (arteriogram 10/13/2019: L PTA occluded-med Rx).  Last seen 10/30/2019 by Tereso Newcomer, PA-C.  Per note NYHA IIb-IIIa, volume status appeared stable.  A. fib noted to be rate controlled.  Shortness of breath felt related to deconditioning.  Patient was a no-show for his appointment with Dr. Elease Hashimoto on 5/7.  Recently admitted 3/15-3/25 with a non-healing diabetic L foot wound in the setting of decompensated heart failure and AFib with RVR.  He diuresed 23 L and lost 30 lbs with IV Furosemide.  He underwent debridement of his L foot wound and was seen by vascular surgery.  His angiogram demonstrated an occluded L PTA.  He did not require intervention.  Echocardiogram demonstrated and EF 30-35%.    IDDM 2, last A1c 11.2 on 10/08/2019.  Will need DOS labs and eval.   EKG 10/30/2019: A. fib.  Rate 95.  Inferior and anteroseptal Q waves.  LAD.  No changes since last tracing.  CT abdomen/pelvis 10/17/19: IMPRESSION: 1. Right rectus sheath hematoma measuring at least 9.9 x 8.6 x 8.0 cm. Adjacent extraperitoneal hemorrhage in the right pelvis which causes mild displacement and mass effect on the urinary bladder. 2. Hepatic steatosis. 3. Colonic tortuosity with moderate volume of stool throughout the colon and stool distending the rectum, suggesting constipation. No bowel obstruction or inflammation.  Cardiac catheterization 10/11/2019 LAD mid 30 LCx prox mild dz RCA large dominant; PL branch small caliber w/ moderately severe stenosis Med Rx  Echocardiogram 10/09/2019 EF 30-35, mod LAE, trivial MR, mild Ao dilation (39 mm)  LE Art [place] 09/26/2019 R prox SFA 30-49; prox popliteal 30-49 L prox and mid SFA 30-49, dist SFA 30-49, popliteal prox - dist  30-49, TPT 30-49   [name]/Anesthesiology Phone [phone] 12/04/2019 4:47 PM

## 2019-12-04 NOTE — Progress Notes (Signed)
Unable to reach pt by phone for pre-op call. Left detailed instructions on pt's voicemail and also gave instructions to his father.   Covid test done 12/03/19.

## 2019-12-04 NOTE — Anesthesia Preprocedure Evaluation (Addendum)
Anesthesia Evaluation  Patient identified by MRN, date of birth, ID band Patient awake    Reviewed: Allergy & Precautions, NPO status , Patient's Chart, lab work & pertinent test results  History of Anesthesia Complications Negative for: history of anesthetic complications  Airway Mallampati: III  TM Distance: >3 FB Neck ROM: Full    Dental  (+) Teeth Intact, Dental Advisory Given   Pulmonary sleep apnea ,    breath sounds clear to auscultation       Cardiovascular hypertension, Pt. on home beta blockers and Pt. on medications +CHF  + dysrhythmias Atrial Fibrillation  Rhythm:Regular Rate:Normal   '21 TTE - EF 30 to 35%. LV has moderately decreased function. The left ventricle demonstrates global hypokinesis. The left ventricular internal cavity size  was mildly dilated. Left ventricular diastolic parameters are indeterminate. LA was moderately dilated. Trivial mitral valve regurgitation. Mild dilatation of the ascending  aorta measuring 39 mm.     Neuro/Psych  Neuromuscular disease (neuropathy) negative psych ROS   GI/Hepatic negative GI ROS, Neg liver ROS,   Endo/Other  diabetes, Type 2, Oral Hypoglycemic Agents, Insulin DependentMorbid obesity  Renal/GU CRFRenal disease     Musculoskeletal negative musculoskeletal ROS (+)   Abdominal (+) + obese,   Peds  Hematology  On xarelto    Anesthesia Other Findings Covid neg 5/10  Reproductive/Obstetrics                           Anesthesia Physical Anesthesia Plan  ASA: III  Anesthesia Plan: MAC   Post-op Pain Management:    Induction: Intravenous  PONV Risk Score and Plan: 2 and Treatment may vary due to age or medical condition, Ondansetron and Midazolam  Airway Management Planned: Natural Airway and Simple Face Mask  Additional Equipment: None  Intra-op Plan:   Post-operative Plan:   Informed Consent: I have reviewed the  patients History and Physical, chart, labs and discussed the procedure including the risks, benefits and alternatives for the proposed anesthesia with the patient or authorized representative who has indicated his/her understanding and acceptance.     Dental advisory given  Plan Discussed with: CRNA and Anesthesiologist  Anesthesia Plan Comments: (See PAT note by Karoline Caldwell, PA-C: Follows with cardiology for hx of combined systolic and diastolic CHF, NICM (cath 07/27/5850: Mild nonobstructive disease except for severe disease in a small caliber PL branch-med Rx), paroxysmal A. fib on Xarelto, hypertension, PAD (arteriogram 10/13/2019: L PTA occluded-med Rx).  Last seen 10/30/2019 by Richardson Dopp, PA-C.  Per note NYHA IIb-IIIa, volume status appeared stable.  A. fib noted to be rate controlled.  Shortness of breath felt related to deconditioning.  Patient was a no-show for his appointment with Dr. Acie Fredrickson on 5/7.  Recently admitted 3/15-3/25 with a non-healing diabetic L foot wound in the setting of decompensated heart failure and AFib with RVR.  He diuresed 23 L and lost 30 lbs with IV Furosemide.  He underwent debridement of his L foot wound and was seen by vascular surgery.  His angiogram demonstrated an occluded L PTA.  He did not require intervention.  Echocardiogram demonstrated and EF 30-35%.   IDDM 2, last A1c 11.2 on 10/08/2019.  EKG 10/30/2019: A. fib.  Rate 95.  Inferior and anteroseptal Q waves.  LAD.  No changes since last tracing.  Cardiac catheterization 10/11/2019 LAD mid 30 LCx prox mild dz RCA large dominant; PL branch small caliber w/ moderately severe stenosis Med Rx  Echocardiogram  10/09/2019 EF 30-35, mod LAE, trivial MR, mild Ao dilation (39 mm))      Anesthesia Quick Evaluation

## 2019-12-05 ENCOUNTER — Encounter: Payer: Self-pay | Admitting: Podiatry

## 2019-12-05 ENCOUNTER — Other Ambulatory Visit: Payer: Self-pay

## 2019-12-05 ENCOUNTER — Ambulatory Visit (HOSPITAL_COMMUNITY): Payer: Medicare HMO | Admitting: Physician Assistant

## 2019-12-05 ENCOUNTER — Ambulatory Visit (HOSPITAL_COMMUNITY)
Admission: RE | Admit: 2019-12-05 | Discharge: 2019-12-05 | Disposition: A | Discharge status: Home / Self Care | Payer: Medicare HMO | Attending: Podiatry | Admitting: Podiatry

## 2019-12-05 ENCOUNTER — Encounter (HOSPITAL_COMMUNITY): Payer: Self-pay | Admitting: Podiatry

## 2019-12-05 ENCOUNTER — Encounter (HOSPITAL_COMMUNITY)
Admission: RE | Disposition: A | Discharge status: Home / Self Care | Payer: Self-pay | Source: Home / Self Care | Attending: Podiatry

## 2019-12-05 DIAGNOSIS — Z794 Long term (current) use of insulin: Secondary | ICD-10-CM | POA: Diagnosis not present

## 2019-12-05 DIAGNOSIS — L97529 Non-pressure chronic ulcer of other part of left foot with unspecified severity: Secondary | ICD-10-CM | POA: Diagnosis not present

## 2019-12-05 DIAGNOSIS — G4730 Sleep apnea, unspecified: Secondary | ICD-10-CM | POA: Diagnosis not present

## 2019-12-05 DIAGNOSIS — I4891 Unspecified atrial fibrillation: Secondary | ICD-10-CM | POA: Diagnosis not present

## 2019-12-05 DIAGNOSIS — E1140 Type 2 diabetes mellitus with diabetic neuropathy, unspecified: Secondary | ICD-10-CM | POA: Diagnosis not present

## 2019-12-05 DIAGNOSIS — E11621 Type 2 diabetes mellitus with foot ulcer: Secondary | ICD-10-CM | POA: Insufficient documentation

## 2019-12-05 DIAGNOSIS — Z79899 Other long term (current) drug therapy: Secondary | ICD-10-CM | POA: Insufficient documentation

## 2019-12-05 DIAGNOSIS — I5042 Chronic combined systolic (congestive) and diastolic (congestive) heart failure: Secondary | ICD-10-CM | POA: Insufficient documentation

## 2019-12-05 DIAGNOSIS — I428 Other cardiomyopathies: Secondary | ICD-10-CM | POA: Insufficient documentation

## 2019-12-05 DIAGNOSIS — Z6841 Body Mass Index (BMI) 40.0 and over, adult: Secondary | ICD-10-CM | POA: Diagnosis not present

## 2019-12-05 DIAGNOSIS — I2510 Atherosclerotic heart disease of native coronary artery without angina pectoris: Secondary | ICD-10-CM | POA: Diagnosis not present

## 2019-12-05 DIAGNOSIS — Z7901 Long term (current) use of anticoagulants: Secondary | ICD-10-CM | POA: Diagnosis not present

## 2019-12-05 DIAGNOSIS — E1151 Type 2 diabetes mellitus with diabetic peripheral angiopathy without gangrene: Secondary | ICD-10-CM | POA: Diagnosis not present

## 2019-12-05 DIAGNOSIS — I110 Hypertensive heart disease with heart failure: Secondary | ICD-10-CM | POA: Insufficient documentation

## 2019-12-05 DIAGNOSIS — E1142 Type 2 diabetes mellitus with diabetic polyneuropathy: Secondary | ICD-10-CM

## 2019-12-05 HISTORY — PX: INCISION AND DRAINAGE OF WOUND: SHX1803

## 2019-12-05 HISTORY — PX: APPLICATION OF A-CELL OF EXTREMITY: SHX6303

## 2019-12-05 LAB — CBC
HCT: 43.1 % (ref 39.0–52.0)
Hemoglobin: 13.8 g/dL (ref 13.0–17.0)
MCH: 31.4 pg (ref 26.0–34.0)
MCHC: 32 g/dL (ref 30.0–36.0)
MCV: 98 fL (ref 80.0–100.0)
Platelets: 206 10*3/uL (ref 150–400)
RBC: 4.4 MIL/uL (ref 4.22–5.81)
RDW: 14.1 % (ref 11.5–15.5)
WBC: 6.7 10*3/uL (ref 4.0–10.5)
nRBC: 0 % (ref 0.0–0.2)

## 2019-12-05 LAB — BASIC METABOLIC PANEL
Anion gap: 12 (ref 5–15)
BUN: 22 mg/dL — ABNORMAL HIGH (ref 6–20)
CO2: 28 mmol/L (ref 22–32)
Calcium: 8.6 mg/dL — ABNORMAL LOW (ref 8.9–10.3)
Chloride: 101 mmol/L (ref 98–111)
Creatinine, Ser: 1.26 mg/dL — ABNORMAL HIGH (ref 0.61–1.24)
GFR calc Af Amer: >60 mL/min (ref >60)
GFR calc non Af Amer: >60 mL/min (ref >60)
Glucose, Bld: 181 mg/dL — ABNORMAL HIGH (ref 70–99)
Potassium: 3.4 mmol/L — ABNORMAL LOW (ref 3.5–5.1)
Sodium: 141 mmol/L (ref 135–145)

## 2019-12-05 LAB — PROTIME-INR
INR: 1.1 (ref 0.8–1.2)
Prothrombin Time: 13.8 seconds (ref 11.4–15.2)

## 2019-12-05 LAB — GLUCOSE, CAPILLARY
Glucose-Capillary: 173 mg/dL — ABNORMAL HIGH (ref 70–99)
Glucose-Capillary: 139 mg/dL — ABNORMAL HIGH (ref 70–99)

## 2019-12-05 SURGERY — IRRIGATION AND DEBRIDEMENT WOUND
Anesthesia: Monitor Anesthesia Care | Site: Foot | Laterality: Left

## 2019-12-05 MED ORDER — BUPIVACAINE HCL (PF) 0.5 % IJ SOLN
INTRAMUSCULAR | Status: AC
  Filled 2019-12-05: qty 30

## 2019-12-05 MED ORDER — LIDOCAINE 2% (20 MG/ML) 5 ML SYRINGE
INTRAMUSCULAR | Status: AC
  Filled 2019-12-05: qty 5

## 2019-12-05 MED ORDER — PROPOFOL 500 MG/50ML IV EMUL
INTRAVENOUS | Status: DC | PRN
  Administered 2019-12-05: 50 ug/kg/min via INTRAVENOUS

## 2019-12-05 MED ORDER — MIDAZOLAM HCL 2 MG/2ML IJ SOLN
INTRAMUSCULAR | Status: AC
  Filled 2019-12-05: qty 2

## 2019-12-05 MED ORDER — PHENYLEPHRINE 40 MCG/ML (10ML) SYRINGE FOR IV PUSH (FOR BLOOD PRESSURE SUPPORT)
PREFILLED_SYRINGE | INTRAVENOUS | Status: AC
  Filled 2019-12-05: qty 20

## 2019-12-05 MED ORDER — PHENYLEPHRINE 40 MCG/ML (10ML) SYRINGE FOR IV PUSH (FOR BLOOD PRESSURE SUPPORT)
PREFILLED_SYRINGE | INTRAVENOUS | Status: DC | PRN
  Administered 2019-12-05 (×2): 80 ug via INTRAVENOUS
  Administered 2019-12-05 (×2): 120 ug via INTRAVENOUS

## 2019-12-05 MED ORDER — ONDANSETRON HCL 4 MG/2ML IJ SOLN
INTRAMUSCULAR | Status: AC
  Filled 2019-12-05: qty 2

## 2019-12-05 MED ORDER — SODIUM CHLORIDE (PF) 0.9 % IJ SOLN
INTRAMUSCULAR | Status: DC | PRN
  Administered 2019-12-05: 200 mL

## 2019-12-05 MED ORDER — BUPIVACAINE HCL (PF) 0.5 % IJ SOLN
INTRAMUSCULAR | Status: DC | PRN
  Administered 2019-12-05: 5 mL

## 2019-12-05 MED ORDER — HYDROCODONE-ACETAMINOPHEN 5-325 MG PO TABS: 1.0000 | ORAL_TABLET | Freq: Four times a day (QID) | ORAL | 0 refills | Status: AC | PRN

## 2019-12-05 MED ORDER — ONDANSETRON HCL 4 MG/2ML IJ SOLN
INTRAMUSCULAR | Status: DC | PRN
  Administered 2019-12-05: 4 mg via INTRAVENOUS

## 2019-12-05 MED ORDER — EPHEDRINE 5 MG/ML INJ
INTRAVENOUS | Status: AC
  Filled 2019-12-05: qty 20

## 2019-12-05 MED ORDER — CHLORHEXIDINE GLUCONATE CLOTH 2 % EX PADS: 6.0000 | MEDICATED_PAD | Freq: Once | CUTANEOUS | Status: DC

## 2019-12-05 MED ORDER — LACTATED RINGERS IV SOLN: INTRAVENOUS | Status: DC

## 2019-12-05 MED ORDER — MIDAZOLAM HCL 5 MG/5ML IJ SOLN
INTRAMUSCULAR | Status: DC | PRN
  Administered 2019-12-05: 2 mg via INTRAVENOUS

## 2019-12-05 MED ORDER — LIDOCAINE HCL (PF) 1 % IJ SOLN
INTRAMUSCULAR | Status: DC | PRN
  Administered 2019-12-05: 5 mL

## 2019-12-05 MED ORDER — VANCOMYCIN HCL 1000 MG IV SOLR
INTRAVENOUS | Status: AC
  Filled 2019-12-05: qty 1000

## 2019-12-05 MED ORDER — LIDOCAINE 2% (20 MG/ML) 5 ML SYRINGE
INTRAMUSCULAR | Status: DC | PRN
  Administered 2019-12-05 (×2): 50 mg via INTRAVENOUS

## 2019-12-05 MED ORDER — LIDOCAINE HCL (PF) 1 % IJ SOLN
INTRAMUSCULAR | Status: AC
  Filled 2019-12-05: qty 30

## 2019-12-05 SURGICAL SUPPLY — 45 items
APL PRP STRL LF DISP 70% ISPRP (MISCELLANEOUS) ×1
BNDG CMPR 75X41 PLY ABS (GAUZE/BANDAGES/DRESSINGS) ×1
BNDG ELASTIC 6X5.8 VLCR STR LF (GAUZE/BANDAGES/DRESSINGS) ×2 IMPLANT
BNDG STRETCH 4X75 NS LF (GAUZE/BANDAGES/DRESSINGS) ×2 IMPLANT
CHLORAPREP W/TINT 26 (MISCELLANEOUS) ×3 IMPLANT
COVER SURGICAL LIGHT HANDLE (MISCELLANEOUS) ×3 IMPLANT
COVER WAND RF STERILE (DRAPES) ×3 IMPLANT
DRAPE U-SHAPE 47X51 STRL (DRAPES) ×3 IMPLANT
DRSG CUTIMED SORBACT 7X9 (GAUZE/BANDAGES/DRESSINGS) ×2 IMPLANT
DRSG PAD ABDOMINAL 8X10 ST (GAUZE/BANDAGES/DRESSINGS) ×2 IMPLANT
ELECT CAUTERY BLADE 6.4 (BLADE) ×3 IMPLANT
ELECT REM PT RETURN 9FT ADLT (ELECTROSURGICAL) ×3
ELECTRODE REM PT RTRN 9FT ADLT (ELECTROSURGICAL) ×1 IMPLANT
GAUZE SPONGE 4X4 12PLY STRL LF (GAUZE/BANDAGES/DRESSINGS) ×2 IMPLANT
GLOVE BIO SURGEON STRL SZ7.5 (GLOVE) ×3 IMPLANT
GLOVE BIOGEL PI IND STRL 8 (GLOVE) ×1 IMPLANT
GLOVE BIOGEL PI INDICATOR 8 (GLOVE) ×2
GOWN STRL REUS W/ TWL LRG LVL3 (GOWN DISPOSABLE) ×1 IMPLANT
GOWN STRL REUS W/ TWL XL LVL3 (GOWN DISPOSABLE) ×1 IMPLANT
GOWN STRL REUS W/TWL LRG LVL3 (GOWN DISPOSABLE) ×3
GOWN STRL REUS W/TWL XL LVL3 (GOWN DISPOSABLE) ×3
KIT BASIN OR (CUSTOM PROCEDURE TRAY) ×3 IMPLANT
KIT TURNOVER KIT B (KITS) ×3 IMPLANT
MANIFOLD NEPTUNE II (INSTRUMENTS) ×3 IMPLANT
MATRIX WOUND 3-LAYER 5X5 (Tissue) ×1 IMPLANT
MICROMATRIX 1000MG (Tissue) ×3 IMPLANT
NDL HYPO 25GX1X1/2 BEV (NEEDLE) IMPLANT
NEEDLE HYPO 25GX1X1/2 BEV (NEEDLE) ×3 IMPLANT
NS IRRIG 1000ML POUR BTL (IV SOLUTION) ×3 IMPLANT
PACK ORTHO EXTREMITY (CUSTOM PROCEDURE TRAY) ×3 IMPLANT
PAD ARMBOARD 7.5X6 YLW CONV (MISCELLANEOUS) ×6 IMPLANT
PROBE DEBRIDE SONICVAC MISONIX (TIP) ×2 IMPLANT
SOL PREP POV-IOD 4OZ 10% (MISCELLANEOUS) ×6 IMPLANT
SOLUTION PARTIC MCRMTRX 1000MG (Tissue) IMPLANT
SUT VIC AB 2-0 SH 27 (SUTURE) ×6
SUT VIC AB 2-0 SH 27X BRD (SUTURE) IMPLANT
SUT VIC AB 2-0 SH 27XBRD (SUTURE) IMPLANT
SYR CONTROL 10ML LL (SYRINGE) ×2 IMPLANT
TOWEL GREEN STERILE (TOWEL DISPOSABLE) ×3 IMPLANT
TOWEL GREEN STERILE FF (TOWEL DISPOSABLE) ×3 IMPLANT
TUBE CONNECTING 12'X1/4 (SUCTIONS) ×1
TUBE CONNECTING 12X1/4 (SUCTIONS) ×2 IMPLANT
TUBE IRRIGATION SET MISONIX (TUBING) ×2 IMPLANT
WOUND MATRIX 3-LAYER 5X5 (Tissue) ×1 IMPLANT
YANKAUER SUCT BULB TIP NO VENT (SUCTIONS) ×3 IMPLANT

## 2019-12-05 NOTE — Anesthesia Postprocedure Evaluation (Signed)
Anesthesia Post Note  Patient: [name]  Procedure(s) Performed: IRRIGATION AND DEBRIDEMENT LEFT FOOT  WOUND AND APPLICATION GRAFT (Left Foot) Application Of A-Cell Of Extremity (Left Foot)     Patient location during evaluation: PACU Anesthesia Type: MAC Level of consciousness: awake and alert Pain management: pain level controlled Vital Signs Assessment: post-procedure vital signs reviewed and stable Respiratory status: spontaneous breathing, nonlabored ventilation, respiratory function stable and patient connected to nasal cannula oxygen Cardiovascular status: stable and blood pressure returned to baseline Postop Assessment: no apparent nausea or vomiting Anesthetic complications: no    Last Vitals:  Vitals:   12/05/19 1555 12/05/19 1610  BP: 113/75 116/75  Pulse: 89 93  Resp: 18 16  Temp: 36.8 C   SpO2: 99% 100%    Last Pain:  Vitals:   12/05/19 1525  TempSrc:   PainSc: 0-No pain                 Effie Berkshire

## 2019-12-05 NOTE — Discharge Instructions (Signed)
Resume all home medications as prior to surgery Stay off of your foot as much as possible Elevate your feet.  See written instructions

## 2019-12-05 NOTE — H&P (Signed)
See PCP note for full H&P.   Patient seen in preop. He is scheduled for wound debridement/graft application of the left foot. He denies any fevers, chills, nausea vomiting. No increase in swelling. He has no questions or concerns. NPO. Surgical consent signed. Again reviewed risks of surgery and alternatives. He wishes to proceed.   Ovid Curd, DPM O: [phone] C: [phone]

## 2019-12-05 NOTE — Transfer of Care (Signed)
Immediate Anesthesia Transfer of Care Note  Patient: Maurice Nguyen  Procedure(s) Performed: IRRIGATION AND DEBRIDEMENT LEFT FOOT  WOUND AND APPLICATION GRAFT (Left Foot)  Patient Location: PACU  Anesthesia Type:MAC  Level of Consciousness: patient cooperative and responds to stimulation  Airway & Oxygen Therapy: Patient Spontanous Breathing and Patient connected to face mask oxygen  Post-op Assessment: Report given to RN and Post -op Vital signs reviewed and stable  Post vital signs: Reviewed and stable  Last Vitals:  Vitals Value Taken Time  BP 111/67 12/05/19 1524  Temp    Pulse 90 12/05/19 1527  Resp 23 12/05/19 1527  SpO2 100 % 12/05/19 1527  Vitals shown include unvalidated device data.  Last Pain:  Vitals:   12/05/19 1224  TempSrc:   PainSc: 6       Patients Stated Pain Goal: 2 (11/88/67 7373)  Complications: No apparent anesthesia complications

## 2019-12-05 NOTE — Progress Notes (Signed)
Subjective: 54 year old male presents the office today for follow-up evaluation of wounds to the bottom of his left foot.  Unfortunately paratracheally.  The office was not covered by insurance.  He states that overall the drainage has improved he has been continuing Iodosorb dressing changes daily and try to limit weightbearing. Denies any fevers, chills, nausea, vomiting.  No calf pain, chest pain, shortness of breath.  Objective: AAO x3, NAD Neurovascular status unchanged.   The graft is incorporated to the heel.  This granulation tissue present with still a small small amounts of fibrotic tissue within the wound.  Also granular tissue present along the midfoot wound.  Today the wounds measurements are as follows  Midfoot lateral ulcer: 5 x 1.2 x 0.2cm Heel: 3.8 x 3.5 x 0.5 cm. Neither the wounds probe to bone.  There is no exposed tendon.  There is no surrounding erythema, ascending cellulitis but there is no fluctuance or crepitation but there is no malodor.  No purulence.  Small amount of bloody drainage on the bandage. No open lesions or pre-ulcerative lesions.  No pain with calf compression, swelling, warmth, erythema  Assessment: Status post left foot incision drainage, wound debridement, graft application-improving  Plan: -All treatment options discussed with the patient including all alternatives, risks, complications -Sharply debrided the wounds today.  Measurements are above.  Continue limit weightbearing use cam boot for offloading.  --Asked to apply the office was not covered by insurance discussed with him return the operating room for wound debridement, graft application to the heel.  The midfoot lateral wound appears to be doing well.  Possible graft in this as well.  Healed wants to proceed he is scheduled for surgery next week.  We will plan for wound debridement, graft application. -The incision placement as well as the postoperative course was discussed with the patient. I  discussed risks of the surgery which include, but not limited to, infection, bleeding, pain, swelling, need for further surgery, delayed or nonhealing, painful or ugly scar, numbness or sensation changes, recurrence, transfer lesions, further deformity,DVT/PE, loss of toe/foot. Patient understands these risks and wishes to proceed with surgery. The surgical consent was reviewed with the patient all 3 pages were signed. No promises or guarantees were given to the outcome of the procedure. All questions were answered to the best of my ability. Before the surgery the patient was encouraged to call the office if there is any further questions. The surgery will be performed at [facility] on an outpatient basis.  Return for post op.  [name]

## 2019-12-05 NOTE — Brief Op Note (Signed)
12/05/2019  3:22 PM  PATIENT:  Maurice Nguyen  54 y.o. male  PRE-OPERATIVE DIAGNOSIS:  ULCER LEFT FOOT  POST-OPERATIVE DIAGNOSIS:  * No post-op diagnosis entered *  PROCEDURE:  Procedure(s): IRRIGATION AND DEBRIDEMENT LEFT FOOT  WOUND AND APPLICATION GRAFT (Left)  SURGEON:  Surgeon(s) and Role:    * Trula Slade, DPM - Primary  PHYSICIAN ASSISTANT:   ASSISTANTS: none   ANESTHESIA:   MAC  EBL:  5 cc   BLOOD ADMINISTERED:none  DRAINS: none   LOCAL MEDICATIONS USED:  OTHER 10 cc lidocaine and marcaine plain  SPECIMEN:  No Specimen  DISPOSITION OF SPECIMEN:  N/A  COUNTS:  YES  TOURNIQUET:  * No tourniquets in log *  DICTATION: .Dragon Dictation  PLAN OF CARE: Discharge to home after PACU  PATIENT DISPOSITION:  PACU - hemodynamically stable.   Delay start of Pharmacological VTE agent (>24hrs) due to surgical blood loss or risk of bleeding: no

## 2019-12-10 ENCOUNTER — Ambulatory Visit (INDEPENDENT_AMBULATORY_CARE_PROVIDER_SITE_OTHER): Payer: Medicare HMO | Admitting: Podiatry

## 2019-12-10 ENCOUNTER — Other Ambulatory Visit: Payer: Self-pay

## 2019-12-10 ENCOUNTER — Ambulatory Visit: Payer: Medicare HMO

## 2019-12-10 DIAGNOSIS — L97529 Non-pressure chronic ulcer of other part of left foot with unspecified severity: Secondary | ICD-10-CM

## 2019-12-10 DIAGNOSIS — M869 Osteomyelitis, unspecified: Secondary | ICD-10-CM

## 2019-12-10 NOTE — Progress Notes (Signed)
Subjective: 54 year old male presents the office today for follow-up evaluation of wounds to the bottom of his left foot.  He follows with Dr. [name] and there was noticed to be increased drainage as well as green drainage on the bandage.  They have switched to Iodosorb dressing changes.  Denies any fevers, chills, nausea, vomiting.  No calf pain, chest pain or shortness of breath.    Objective: AAO x3, NAD Neurovascular status unchanged.   The graft is incorporated to the heel.  There is granulation tissue present to both of the ulcerations and there is no probing to bone, undermining or tunneling.  There is no surrounding erythema, ascending cellulitis.  No fluctuation crepitation.  Is malodor.  After debridement measurements Midfoot lateral ulcer: 6 x 1.5 x 0.3 cm Heel: 4 x 3.5 x 0.5 cm. Neither the wounds probe to bone.  There is no exposed tendon.  There is no surrounding erythema, ascending cellulitis but there is no fluctuance or crepitation but there is no malodor.  No purulence.  Small amount of bloody drainage on the bandage. No open lesions or pre-ulcerative lesions.  No pain with calf compression, swelling, warmth, erythema  Assessment: Status post left foot incision drainage, wound debridement, graft application-improving  Plan: -All treatment options discussed with the patient including all alternatives, risks, complications -Lightly debrided the wounds today.  Measurements are above.  Recommend continue limit weightbearing and continue in cam boot.  Continue with Iodosorb with a dry dressing changes for now.  Discussed using left saline to help dry the area it does not need to moist macerated. -Finish course of antibiotics -Elevation.. -Monitor for any clinical signs or symptoms of infection and directed to call the office immediately should any occur or go to the ER.  No follow-ups on file.  [name]

## 2019-12-11 ENCOUNTER — Telehealth: Payer: Self-pay | Admitting: *Deleted

## 2019-12-11 ENCOUNTER — Emergency Department (HOSPITAL_COMMUNITY): Payer: Medicare HMO

## 2019-12-11 ENCOUNTER — Other Ambulatory Visit: Payer: Self-pay

## 2019-12-11 ENCOUNTER — Encounter (HOSPITAL_COMMUNITY): Payer: Self-pay | Admitting: Emergency Medicine

## 2019-12-11 ENCOUNTER — Emergency Department (HOSPITAL_COMMUNITY)
Admission: EM | Admit: 2019-12-11 | Discharge: 2019-12-11 | Disposition: A | Discharge status: Home / Self Care | Payer: Medicare HMO | Attending: Emergency Medicine | Admitting: Emergency Medicine

## 2019-12-11 ENCOUNTER — Ambulatory Visit: Payer: Medicare HMO | Admitting: Podiatry

## 2019-12-11 DIAGNOSIS — Z79899 Other long term (current) drug therapy: Secondary | ICD-10-CM | POA: Insufficient documentation

## 2019-12-11 DIAGNOSIS — I130 Hypertensive heart and chronic kidney disease with heart failure and stage 1 through stage 4 chronic kidney disease, or unspecified chronic kidney disease: Secondary | ICD-10-CM | POA: Insufficient documentation

## 2019-12-11 DIAGNOSIS — N182 Chronic kidney disease, stage 2 (mild): Secondary | ICD-10-CM | POA: Diagnosis not present

## 2019-12-11 DIAGNOSIS — R519 Headache, unspecified: Secondary | ICD-10-CM | POA: Diagnosis not present

## 2019-12-11 DIAGNOSIS — M542 Cervicalgia: Secondary | ICD-10-CM | POA: Diagnosis not present

## 2019-12-11 DIAGNOSIS — M545 Low back pain: Secondary | ICD-10-CM | POA: Insufficient documentation

## 2019-12-11 DIAGNOSIS — E11621 Type 2 diabetes mellitus with foot ulcer: Secondary | ICD-10-CM | POA: Insufficient documentation

## 2019-12-11 DIAGNOSIS — Z794 Long term (current) use of insulin: Secondary | ICD-10-CM | POA: Diagnosis not present

## 2019-12-11 DIAGNOSIS — E1122 Type 2 diabetes mellitus with diabetic chronic kidney disease: Secondary | ICD-10-CM | POA: Insufficient documentation

## 2019-12-11 DIAGNOSIS — L97509 Non-pressure chronic ulcer of other part of unspecified foot with unspecified severity: Secondary | ICD-10-CM | POA: Diagnosis not present

## 2019-12-11 DIAGNOSIS — I5042 Chronic combined systolic (congestive) and diastolic (congestive) heart failure: Secondary | ICD-10-CM | POA: Insufficient documentation

## 2019-12-11 DIAGNOSIS — Z7901 Long term (current) use of anticoagulants: Secondary | ICD-10-CM

## 2019-12-11 LAB — CBC WITH DIFFERENTIAL/PLATELET
Abs Immature Granulocytes: 0.01 10*3/uL (ref 0.00–0.07)
Basophils Absolute: 0 10*3/uL (ref 0.0–0.1)
Basophils Relative: 0 %
Eosinophils Absolute: 0.2 10*3/uL (ref 0.0–0.5)
Eosinophils Relative: 3 %
HCT: 43.3 % (ref 39.0–52.0)
Hemoglobin: 13.6 g/dL (ref 13.0–17.0)
Immature Granulocytes: 0 %
Lymphocytes Relative: 30 %
Lymphs Abs: 2 10*3/uL (ref 0.7–4.0)
MCH: 31.1 pg (ref 26.0–34.0)
MCHC: 31.4 g/dL (ref 30.0–36.0)
MCV: 99.1 fL (ref 80.0–100.0)
Monocytes Absolute: 0.8 10*3/uL (ref 0.1–1.0)
Monocytes Relative: 12 %
Neutro Abs: 3.6 10*3/uL (ref 1.7–7.7)
Neutrophils Relative %: 55 %
Platelets: 224 10*3/uL (ref 150–400)
RBC: 4.37 MIL/uL (ref 4.22–5.81)
RDW: 14.2 % (ref 11.5–15.5)
WBC: 6.5 10*3/uL (ref 4.0–10.5)
nRBC: 0 % (ref 0.0–0.2)

## 2019-12-11 LAB — COMPREHENSIVE METABOLIC PANEL
ALT: 22 U/L (ref 0–44)
AST: 23 U/L (ref 15–41)
Albumin: 3.1 g/dL — ABNORMAL LOW (ref 3.5–5.0)
Alkaline Phosphatase: 56 U/L (ref 38–126)
Anion gap: 15 (ref 5–15)
BUN: 20 mg/dL (ref 6–20)
CO2: 23 mmol/L (ref 22–32)
Calcium: 8.8 mg/dL — ABNORMAL LOW (ref 8.9–10.3)
Chloride: 101 mmol/L (ref 98–111)
Creatinine, Ser: 1.15 mg/dL (ref 0.61–1.24)
GFR calc Af Amer: >60 mL/min (ref >60)
GFR calc non Af Amer: >60 mL/min (ref >60)
Glucose, Bld: 206 mg/dL — ABNORMAL HIGH (ref 70–99)
Potassium: 3.3 mmol/L — ABNORMAL LOW (ref 3.5–5.1)
Sodium: 139 mmol/L (ref 135–145)
Total Bilirubin: 1 mg/dL (ref 0.3–1.2)
Total Protein: 7.1 g/dL (ref 6.5–8.1)

## 2019-12-11 LAB — CBG MONITORING, ED: Glucose-Capillary: 175 mg/dL — ABNORMAL HIGH (ref 70–99)

## 2019-12-11 NOTE — Telephone Encounter (Signed)
We can hold home health for now. I will change the bandage
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
## 2019-12-11 NOTE — Discharge Instructions (Signed)
Please take Tylenol (acetaminophen) to relieve your pain.  You may take tylenol, up to 1,000 mg (two extra strength pills).  Do not take more than 3,000 mg tylenol in a 24 hour period.  Please check all medication labels as many medications such as pain and cold medications may contain tylenol. Please do not drink alcohol while taking this medication.  ° °

## 2019-12-11 NOTE — Telephone Encounter (Signed)
Maurice Nguyen states pt had surgery for skin graft and she did not have current wound care orders, pt has an appt 12/13/2019, she request orders until pt is seen if appropriate.
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
## 2019-12-11 NOTE — Telephone Encounter (Signed)
I spoke with Maurice Nguyen and informed that Dr. Ardelle Anton would perform the wound care Thursday and will give orders later.

## 2019-12-11 NOTE — Telephone Encounter (Signed)
Left message to call for orders.

## 2019-12-11 NOTE — Telephone Encounter (Signed)
Maurice Nguyen returned call for orders.

## 2019-12-12 NOTE — Op Note (Signed)
PATIENT:  Maurice Nguyen  54 y.o. male  PRE-OPERATIVE DIAGNOSIS:  ULCER LEFT FOOT  POST-OPERATIVE DIAGNOSIS:  * No post-op diagnosis entered *  PROCEDURE:  Procedure(s): IRRIGATION AND DEBRIDEMENT LEFT FOOT  WOUND AND APPLICATION GRAFT (Left)  SURGEON:  Surgeon(s) and Role:    * Trula Slade, DPM - Primary  PHYSICIAN ASSISTANT:   ASSISTANTS: none   ANESTHESIA:   MAC  EBL:  5 cc   BLOOD ADMINISTERED:none  DRAINS: none   LOCAL MEDICATIONS USED:  OTHER 10 cc lidocaine and marcaine plain  SPECIMEN:  No Specimen  DISPOSITION OF SPECIMEN:  N/A  COUNTS:  YES  TOURNIQUET:  * No tourniquets in log *  DICTATION: .Dragon Dictation  PLAN OF CARE: Discharge to home after PACU  PATIENT DISPOSITION:  PACU - hemodynamically stable.   Delay start of Pharmacological VTE agent (>24hrs) due to surgical blood loss or risk of bleeding: no  Indications for surgery: 54 year old male previously underwent wound debridement due to left heel ulcerations.  He has undergone local wound care In the Left Lateral Midfoot Wound Appears to Be Healing However the Heel Wound Still.  Due to this discussed with him surgical debridement, repeat ACell application.  We discussed the surgery as well as postoperative course.  Alternatives, risks, complications were discussed.  No promises or guarantees were given effective the procedure all questions were answered to the best my ability.  Procedure in detail: The patient was both verbally inpatient identified by myself, the nursing staff, the anesthesia staff preoperatively.  He was transferred the operating room via stretcher.  After an adequate plane of anesthesia was performed a mixture of 10 cc of lidocaine, Marcaine plain was infiltrated in a regional block fashion of the surgical site.  The left lower extremities and scrubbed, prepped, draped in normal sterile fashion  At this time a timeout was performed again.  Attention was  directed to the plantar heel wound in which there was a small amount of fibrotic tissue and granulation tissue present.  After debridement of the wound it measured 3.9 x 3 x 0.5 cm and was a 100% granular.  Prior to the debridement the wound measured approximately 3.6 x 2.9 x 0.3 cm.  After debridement there is no exposed bone or tendon.  The wound was cups irrigated with saline and hemostasis achieved.  At this time I utilized 1000 mL of micromatrix and I used a 4 x 4 sheet of Acell crystal matrix.  Nonadherent dressings applied followed by petroleum jelly and dry sterile dressing.  Prior to application of dressing I also sharply debrided the lateral midfoot wound present.  The wound was under percent granular and superficial.  There is no signs of infection to either ulcers.  He was woken anesthesia and tolerated procedure well.  Transferred to PACU with VSS and VSI.   Continue in cam boot and limit weightbearing.  Hurts elevation.  I will see him back in the office next week for dressing change.

## 2019-12-13 ENCOUNTER — Other Ambulatory Visit: Payer: Self-pay

## 2019-12-13 ENCOUNTER — Ambulatory Visit (INDEPENDENT_AMBULATORY_CARE_PROVIDER_SITE_OTHER): Payer: Medicare HMO | Admitting: Podiatry

## 2019-12-13 DIAGNOSIS — E1142 Type 2 diabetes mellitus with diabetic polyneuropathy: Secondary | ICD-10-CM

## 2019-12-13 DIAGNOSIS — L97529 Non-pressure chronic ulcer of other part of left foot with unspecified severity: Secondary | ICD-10-CM

## 2019-12-14 ENCOUNTER — Telehealth: Payer: Self-pay | Admitting: *Deleted

## 2019-12-14 NOTE — Telephone Encounter (Signed)
Called patient on 12-05-2019 stating that I was calling to check on patient after having surgery with Dr Ardelle Anton and patient stated that there was not any chills or fever and not any nausea and felt alright and the pain was ok and did take the pain medicine 3 hours ago and the nerve block was good and I have iced and elevated and I stated to call the office if any concerns or questions. Misty Stanley

## 2019-12-15 NOTE — Progress Notes (Signed)
Subjective: NALIN MAZZOCCO is a 54 y.o. is seen today in office s/p left heel wound debridement, ACell application.  States he has been doing well.  No significant pain.  Denies any fevers, chills, nausea, vomiting.  No calf pain, chest pain, shortness of breath.  No other concerns today.   Objective: General: No acute distress, AAOx3  DP/PT pulses palpable 2/4, CRT < 3 sec to all digits.  LEFT foot: Plantar midfoot lateral wound superficial almost healed.  Graft seems to be incorporating to the plantar heel.  There is no surrounding erythema, ascending cellulitis there is no fluctuation crepitation there is no malodor.  No other open lesions are identified. No other areas of tenderness to bilateral lower extremities.  No other open lesions or pre-ulcerative lesions.  No pain with calf compression, swelling, warmth, erythema.   Assessment and Plan:  Status post left foot wound debridement, ACell application, doing well with no complications   -Treatment options discussed including all alternatives, risks, and complications -Dressing was changed today.  Wound to be doing well in signs of infection.  Applied petroleum jelly followed by dry dressing over the area of the Adaptic on the heel.  Also a small challenge was applied to the lateral midfoot wound.  Continue elevation try stay off the foot is much as possible. -Monitor for any clinical signs or symptoms of infection and directed to call the office immediately should any occur or go to the ER.  No follow-ups on file.  [name]

## 2019-12-15 NOTE — Progress Notes (Signed)
Subjective: Maurice Nguyen is a 54 y.o. is seen today in office s/p left heel wound debridement, ACell application.  States he has been doing well.  Gets some occasional discomfort but currently no pain.  He has no new concerns today.  Presents for dressing change. Denies any fevers, chills, nausea, vomiting.  No calf pain, chest pain, shortness of breath.  No other concerns today.   Objective: General: No acute distress, AAOx3 -wife is present DP/PT pulses palpable 2/4, CRT < 3 sec to all digits.  LEFT foot: Plantar lateral midfoot wound superficial granular base.  There is no surrounding erythema, ascending cellulitis but there is no drainage or pus there is no fluctuation crepitation.  Wound is minimal.  Along the plantar heel the graft is incorporated but there is still some fibrotic tissue which is the graft incorporated into the wound and the rest the wound is granular.  There is no probing or undermining or tunneling wound appears to be filling in slowly.  There is no surrounding erythema, ascending cellulitis there is no fluctuation crepitation.  No malodor there is no other signs of infection noted. No other open lesions or pre-ulcerative lesions.  No pain with calf compression, swelling, warmth, erythema.   Assessment and Plan:  Status post left foot wound debridement, ACell application, doing well with no complications   -Treatment options discussed including all alternatives, risks, and complications -Dressing was changed today with petroleum jelly, Adaptic and a dry dressing to the wounds.  With the dressing clean, dry, intact.  The patient Betadine around the wounds to help avoid any maceration.  Continue cam boot and offloading elevation is much as possible.  No follow-ups on file.  [name]

## 2019-12-18 ENCOUNTER — Other Ambulatory Visit: Payer: Self-pay

## 2019-12-18 ENCOUNTER — Encounter: Payer: Medicare HMO | Admitting: Physician Assistant

## 2019-12-18 ENCOUNTER — Ambulatory Visit (INDEPENDENT_AMBULATORY_CARE_PROVIDER_SITE_OTHER): Payer: Medicare HMO | Admitting: Podiatry

## 2019-12-18 DIAGNOSIS — E1142 Type 2 diabetes mellitus with diabetic polyneuropathy: Secondary | ICD-10-CM

## 2019-12-18 DIAGNOSIS — L97529 Non-pressure chronic ulcer of other part of left foot with unspecified severity: Secondary | ICD-10-CM

## 2019-12-18 NOTE — Progress Notes (Signed)
The patient was identified using 2 identifiers.  Date:  12/18/2019   ID:  Maurice Nguyen, DOB 08/10/1965, MRN [id number]     PCP:  Bernerd Limbo, MD  Cardiologist:  Mertie Moores, MD  Electrophysiologist:  None   Evaluation Performed:    Chief Complaint:    Patient Profile: Maurice Nguyen is a 54 y.o. male with:  Combined systolic and diastolic CHF ? Non-ischemic cardiomyopathy  ? Echo 09/2019: EF 30-35 ? Cath 09/2019: mild non-obs dz except for severe dz in a small caliber PL branch - med Rx   Paroxysmal atrial fibrillation  ? CHA2DS2-VASc=3 (HTN, CHF, Diab) >> Rivaroxaban    Hypertension   Diabetes mellitus  Peripheral Arterial Disease    ? A-gram 09/2019: L PTA occluded - med Rx  Morbid obesity   Hx of rupture appendix in 2013 - managed conservatively   Prior CV studies: Cardiac catheterization 11/04/2019 LAD mid 30 LCx prox mild dz RCA large dominant; PL branch small caliber w/ moderately severe stenosis Med Rx  Echocardiogram 10/09/2019 EF 30-35, mod LAE, trivial MR, mild Ao dilation (39 mm)  LE Art [place] 09/26/2019 R prox SFA 30-49; prox popliteal 30-49 L prox and mid SFA 30-49, dist SFA 30-49, popliteal prox - dist 30-49, TPT 30-49  Echocardiogram 01/31/15 EF 30-35   History of Present Illness:   [name] was last seen in clinic 10/30/2019.  He had been admitted to the hospital with a nonhealing diabetic left foot wound in the setting of decompensated heart failure and atrial fibrillation with rapid rate.  He had debridement of his left foot.  He had a cardiac catheterization which demonstrated severe disease in a small posterolateral branch.  His cardiomyopathy was felt to be nonischemic and his cardiomyopathy was out of proportion to his coronary artery disease.  He was discharged to a skilled nursing facility in [place].  When I saw him he was quite upset about his care at the facility and we try to arrange some help with our social work.  He  had an appointment with Dr. Acie Fredrickson 11/30/2019 but did not show for his appointment.     Past Medical History:  Diagnosis Date  . A-fib ([place])   . Anticoagulated, on Xarelto, Chads2Vas score 2 10/02/2013  . Back pain, chronic 07/13/2012  . CHF (congestive heart failure) ([place])   . 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 CKD (chronic kidney disease), stage II   . Complication of anesthesia    " THEY LOST ME AND BROUGHT ME BACK "  . Hypertension 07/13/2012  . Irregular heart beat   . Lumbar herniated disc   . Morbid obesity ([place])   . Obesity, morbid, BMI 50 or higher ([place]) 07/13/2012  . 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 Renal insufficiency 10/08/2019  . Shortness of breath   . Type II diabetes mellitus ([place]) 07/13/2012   Past Surgical History:  Procedure Laterality Date  . ABSCESS DRAINAGE    . APPLICATION OF A-CELL OF EXTREMITY Left 12/05/2019   Procedure: Application Of A-Cell Of Extremity;  Surgeon: Trula Slade, DPM;  Location: [facility];  Service: Podiatry;  Laterality: Left;  . COLONOSCOPY  08/22/2012   Procedure: COLONOSCOPY;  Surgeon: Lear Ng, MD;  Location: [facility];  Service: Endoscopy;  Laterality: N/A;  . CORONARY STENT PLACEMENT Left 11/04/2019   Procedure: left heart cath and coronary angiography;  Surgeon: Burnell Blanks, MD;  Location: [place];  Service: Cardiovascular;  Laterality: Left;  . DENTAL SURGERY    . INCISION AND DRAINAGE OF WOUND Left 12/05/2019   Procedure: IRRIGATION AND DEBRIDEMENT LEFT FOOT  WOUND AND APPLICATION GRAFT;  Surgeon: Vivi Barrack, DPM;  Location: [facility] OR;  Service: Podiatry;  Laterality: Left;  . LOWER EXTREMITY ANGIOGRAM Bilateral 10/11/2019   Procedure: LOWER EXTREMITY ANGIOGRAM;  Surgeon: Maeola Harman, MD;  [facility] OR;  Service: Vascular;  Laterality: Bilateral;  . WOUND DEBRIDEMENT Left 10/10/2019   Procedure: DEBRIDEMENT WOUND;  Surgeon: Vivi Barrack, DPM;  Location:  [facility] OR;  Service: Podiatry;  Laterality: Left;  . WOUND DEBRIDEMENT Left 10/15/2019   Procedure: DEBRIDEMENT WOUND, POSSIBLE WOUND VAC APPLICATION;  Surgeon: Vivi Barrack, DPM;  Location: [facility] OR;  Service: Podiatry;  Laterality: Left;     No outpatient medications have been marked as taking for the 12/18/19 encounter (Appointment) with Tereso Newcomer T, PA-C.     Allergies:   Patient has no known allergies.   Social History   Tobacco Use  . Smoking status: Never Smoker  . Smokeless tobacco: Never Used  Substance Use Topics  . Alcohol use: No    Comment: rarely  . Drug use: No     Family Hx: The patient's family history is negative for Heart disease.  ROS:   Please see the history of present illness.     All other systems reviewed and are negative.   Prior CV studies:   The following studies were reviewed today:    Labs/Other Tests and Data Reviewed:    EKG:    Recent Labs: 10/09/2019: B Natriuretic Peptide 320.8 10/18/2019: Magnesium 1.9 10/30/2019: NT-Pro BNP 723 12/11/2019: ALT 22; BUN 20; Creatinine, Ser 1.15; Hemoglobin 13.6; Platelets 224; Potassium 3.3; Sodium 139   Recent Lipid Panel Lab Results  Component Value Date/Time   CHOL 109 10/10/2019 04:42 AM   TRIG 49 10/10/2019 04:42 AM   HDL 34 (L) 10/10/2019 04:42 AM   CHOLHDL 3.2 10/10/2019 04:42 AM   LDLCALC 65 10/10/2019 04:42 AM    Wt Readings from Last 3 Encounters:  12/05/19 (!) 408 lb (185.1 kg)  10/30/19 (!) 402 lb 8 oz (182.6 kg)  10/19/19 (!) 405 lb 6.8 oz (183.9 kg)     Objective:    Vital Signs:  There were no vitals taken for this visit.     ASSESSMENT & PLAN:     1. Chronic systolic CHF (congestive heart failure) (HCC) 2. Shortness of breath EF 30-35 by echocardiogram March 2021.  Nonischemic cardiomyopathy.  NYHA IIb-IIIa.  Overall, his volume status appears stable.  His weight is actually down a couple pounds since leaving the hospital.  He remains short of breath but this seems  to be related to deconditioning as well as wearing a mask.  I will obtain a follow-up BMET, BNP today.  If his BNP is significantly elevated, I will adjust his furosemide.  His blood pressure will not tolerate further adjustments in his CHF medications.  Continue current dose of carvedilol and Entresto.    3. Coronary artery disease involving native coronary artery of native heart without angina pectoris Nonobstructive coronary disease by cardiac catheterization.  He is not having angina.  He is not on aspirin as he is on Rivaroxaban.  Continue current dose of atorvastatin.  4.  Permanent atrial fibrillation Rate well controlled.  Continue current dose of carvedilol.  Continue current dose of rivaroxaban.  5. Essential hypertension The patient's blood pressure is controlled  on his current regimen.  Continue current therapy.    COVID-19 Education: The signs and symptoms of COVID-19 were discussed with the patient and how to seek care for testing (follow up with PCP or arrange E-visit).  The importance of social distancing was discussed today.  Time:   Today, I have spent  minutes with the patient with telehealth technology discussing the above problems.     Medication Adjustments/Labs and Tests Ordered: Current medicines are reviewed at length with the patient today.  Concerns regarding medicines are outlined above.   Tests Ordered: No orders of the defined types were placed in this encounter.   Medication Changes: No orders of the defined types were placed in this encounter.   Follow Up:     SignedTereso Newcomer, PA-C  12/18/2019 1:24 PM    [facility] This encounter was created in error - please disregard.

## 2019-12-19 ENCOUNTER — Telehealth: Payer: Self-pay | Admitting: *Deleted

## 2019-12-19 NOTE — Telephone Encounter (Signed)
-----   Message from Matthew R Wagoner, DPM sent at 12/18/2019  4:58 PM EDT ----- Can you put back in home health orders? We put it on hold after surgery To the heel wound apply adaptic followed by petroleum jelly and a dry dressing. To the midfoot wound it would be adaptic and dry dressing   

## 2019-12-19 NOTE — Telephone Encounter (Signed)
Called and spoke with [facility] health nurse and relayed the verbal orders on the dressing changes and the nurse is going to go out once a week and then we will see patient in the office by Dr Ardelle Anton. Misty Stanley

## 2019-12-20 ENCOUNTER — Telehealth: Payer: Self-pay | Admitting: *Deleted

## 2019-12-20 NOTE — Telephone Encounter (Signed)
Entered in error

## 2019-12-20 NOTE — Telephone Encounter (Signed)
-----   Message from Vivi Barrack, DPM sent at 12/18/2019  4:58 PM EDT ----- Can you put back in home health orders? We put it on hold after surgery To the heel wound apply adaptic followed by petroleum jelly and a dry dressing. To the midfoot wound it would be adaptic and dry dressing

## 2019-12-25 ENCOUNTER — Encounter: Payer: Self-pay | Admitting: Podiatry

## 2019-12-25 ENCOUNTER — Other Ambulatory Visit: Payer: Self-pay

## 2019-12-25 ENCOUNTER — Ambulatory Visit (INDEPENDENT_AMBULATORY_CARE_PROVIDER_SITE_OTHER): Payer: Medicare HMO | Admitting: Podiatry

## 2019-12-25 DIAGNOSIS — E1142 Type 2 diabetes mellitus with diabetic polyneuropathy: Secondary | ICD-10-CM

## 2019-12-25 DIAGNOSIS — L97529 Non-pressure chronic ulcer of other part of left foot with unspecified severity: Secondary | ICD-10-CM

## 2019-12-26 NOTE — Progress Notes (Signed)
Subjective: Maurice Nguyen is a 54 y.o. is seen today in office s/p left heel wound debridement, ACell application.  Overall states he is doing well.  Some occasional discomfort but is intermittent.  Denies any fevers, chills, nausea or vomiting.  No calf pain, chest pain, shortness breath.  Has been using the cam boot when ambulating but tries to offload as much as possible. Denies any fevers, chills, nausea, vomiting.  No calf pain, chest pain, shortness of breath.  No other concerns today.   Objective: General: No acute distress, AAOx3 -wife is present DP/PT pulses palpable 2/4, CRT < 3 sec to all digits.  LEFT foot: Plantar lateral midfoot wound superficial granular base.  Plantar midfoot wound is much more superficial and almost healed.  To the heel wound some fibrotic appearing tissue consistent with the graft incorporating is evident but is starting to fill in although slowly.  Granular wound base is evident otherwise.  There is no probing to bone, undermining or tunneling.  There is no exposed bone or tendon.  There is no edema, erythema.  No fluctuation crepitation of the wounds and no malodor. No other open lesions or pre-ulcerative lesions.  No pain with calf compression, swelling, warmth, erythema.   Assessment and Plan:  Status post left foot wound debridement, ACell application, doing well with no complications   -Treatment options discussed including all alternatives, risks, and complications -Dressing was changed today with petroleum jelly, Adaptic and a dry dressing to the wounds.  With the dressing clean, dry, intact.  Orders given for home health nurse to change later this week and I will see him back next week.  Continue cam boot and offloading elevation is much as possible.  No follow-ups on file.  [name]

## 2019-12-27 NOTE — Progress Notes (Signed)
Subjective: Maurice Nguyen is a 54 y.o. is seen today in office s/p left heel wound debridement, ACell application.  He presents today for dressing change.  Has been doing well.  Denies any increased swelling or redness and denies any fevers, chills, nausea, vomiting.  No calf pain, chest pain, shortness of breath.   Objective: General: No acute distress, AAOx3 -wife is present DP/PT pulses palpable 2/4, CRT < 3 sec to all digits.  LEFT foot: Plantar lateral midfoot wound appears to be healed and is mild callus formation along the area.  On the plantar heel wound measures 3 x 3 cm with still some fibrotic tissue where the graft is incorporating but overall appears to be filling in as well although slowly.  There is no probing to bone, undermining or tunneling.  There is no surrounding erythema, ascending cellulitis but there is no fluctuation crepitation but there is no malodor. No other open lesions or pre-ulcerative lesions.  No pain with calf compression, swelling, warmth, erythema.   Assessment and Plan:  Status post left foot wound debridement, ACell application, doing well with no complications   -Treatment options discussed including all alternatives, risks, and complications -Dressing was changed today with petroleum jelly, Adaptic and a dry dressing to the wounds.  With the dressing clean, dry, intact.  Home health care nurse will change the bandage later this week and I will see him back next week for dressing change and wound evaluation.  Continue cam boot and offloading elevation is much as possible.  No follow-ups on file.  [name]

## 2020-01-01 ENCOUNTER — Other Ambulatory Visit: Payer: Self-pay

## 2020-01-01 ENCOUNTER — Ambulatory Visit (INDEPENDENT_AMBULATORY_CARE_PROVIDER_SITE_OTHER): Payer: Medicare HMO | Admitting: Podiatry

## 2020-01-01 DIAGNOSIS — L97529 Non-pressure chronic ulcer of other part of left foot with unspecified severity: Secondary | ICD-10-CM

## 2020-01-01 DIAGNOSIS — E1142 Type 2 diabetes mellitus with diabetic polyneuropathy: Secondary | ICD-10-CM

## 2020-01-06 NOTE — Progress Notes (Signed)
Subjective: Maurice Nguyen is a 54 y.o. is seen today in office s/p left heel wound debridement, ACell application.  States he has been doing well with any new concerns.  He is transcatheter.  He is in the cam boot.  He is eager to start to put weight on the foot.  Blood sugar is also improving.  Denies any increased swelling or redness and denies any fevers, chills, nausea, vomiting.  No calf pain, chest pain, shortness of breath.   Objective: General: No acute distress, AAOx3 -wife is present DP/PT pulses palpable 2/4, CRT < 3 sec to all digits.  LEFT foot: Plantar lateral midfoot wound appears to be healed still with some callus formation.  On the plantar heel wound measures 3 x 2.7 cm with still some fibrotic tissue where the graft is incorporating but overall appears to be filling in.  It seems that actually good improvement compared to last appointment.  There is no probing to bone, undermining or tunneling.  There is no surrounding erythema, ascending cellulitis but there is no fluctuation crepitation but there is no malodor. No other open lesions or pre-ulcerative lesions.  No pain with calf compression, swelling, warmth, erythema.   Assessment and Plan:  Status post left foot wound debridement, ACell application, doing well with no complications   -Treatment options discussed including all alternatives, risks, and complications -Dressing was changed today with petroleum jelly, Adaptic and a dry dressing to the wounds.  With the dressing clean, dry, intact.  Home health care nurse will change the bandage later this week and I will see him back next week for dressing change and wound evaluation.  Continue cam boot and offloading elevation is much as possible.  No follow-ups on file.  [name]

## 2020-01-08 ENCOUNTER — Other Ambulatory Visit: Payer: Self-pay

## 2020-01-08 ENCOUNTER — Ambulatory Visit (INDEPENDENT_AMBULATORY_CARE_PROVIDER_SITE_OTHER): Payer: Medicare HMO | Admitting: Podiatry

## 2020-01-08 DIAGNOSIS — L97529 Non-pressure chronic ulcer of other part of left foot with unspecified severity: Secondary | ICD-10-CM

## 2020-01-08 DIAGNOSIS — E1142 Type 2 diabetes mellitus with diabetic polyneuropathy: Secondary | ICD-10-CM

## 2020-01-14 NOTE — Progress Notes (Signed)
Subjective: Maurice Nguyen is a 54 y.o. is seen today in office s/p left heel wound debridement, ACell application.  States he been doing well.  He is having some occasional discomfort but overall improved.  Denies any drainage or pus or increase in swelling or redness of the foot. Denies any fevers, chills, nausea, vomiting.  No calf pain, chest pain, shortness of breath.   Objective: General: No acute distress, AAOx3 -wife is present DP/PT pulses palpable 2/4, CRT < 3 sec to all digits.  LEFT foot: Plantar lateral midfoot wound appears to be healed with minimal hyperkeratotic tissue.  The wound on the heel appears to be filling in and is more superficial today.  Measurements are 3 x 2.5 cm.  Some fibrotic tissue is evident where the graft is incorporating.  There is no surrounding erythema, ascending cellulitis there is no fluctuation capitation.  There is no malodor.  No other open lesion on the left side. RIGHT: Mild callus formation on the anterior ankle without any ulceration.  No drainage or pus. No other open lesions or pre-ulcerative lesions.  No pain with calf compression, swelling, warmth, erythema.   Assessment and Plan:  Status post left foot wound debridement, ACell application, doing well with no complications   -Treatment options discussed including all alternatives, risks, and complications -Dressing was changed today with petroleum jelly, Adaptic and a dry dressing to the wounds.  -Continue in cam boot and offloading.  Continue to elevate the foot tries to offload as much as possible.  The wound on the heel is doing better.   No follow-ups on file.  [name]

## 2020-01-16 ENCOUNTER — Other Ambulatory Visit: Payer: Self-pay

## 2020-01-16 ENCOUNTER — Ambulatory Visit (INDEPENDENT_AMBULATORY_CARE_PROVIDER_SITE_OTHER): Payer: Medicare HMO | Admitting: Podiatry

## 2020-01-16 DIAGNOSIS — L97529 Non-pressure chronic ulcer of other part of left foot with unspecified severity: Secondary | ICD-10-CM

## 2020-01-18 NOTE — Progress Notes (Signed)
Subjective: Maurice Nguyen is a 54 y.o. is seen today in office s/p left heel wound debridement, ACell application.  Some occasional discomfort but overall minimal and not taking anything for it.  Although this change the bandage since I last saw him he has no new concerns.  Denies any systemic concerns including fevers, chills, nausea, vomiting.  No calf pain, chest pain, shortness of breath.   Objective: General: No acute distress, AAOx3 -wife is present DP/PT pulses palpable 2/4, CRT < 3 sec to all digits.  LEFT foot: Plantar lateral midfoot wound appears to be healed with minimal hyperkeratotic tissue.  Left heel slowly filling in.  Small meta fibrotic tissue present.  Wound.  Very about the same in diameter.  No exposed tendon or bone.  There is no surrounding erythema, ascending cellulitis.  There is no fluctuation of the patient.  There is no malodor.   No other open lesions or pre-ulcerative lesions.  No pain with calf compression, swelling, warmth, erythema.   Assessment and Plan:  Status post left foot wound debridement, ACell application, doing well with no complications   -Treatment options discussed including all alternatives, risks, and complications -Debrided some of the fibrotic tissue off the wound today lacerated tissue numbers both 312 with scalpel down to bleeding tissue.  Wound was cleaned with saline.  Adaptic followed by petroleum jelly was applied.  Dry sterile dressing was then applied.  Continue with surgical boot and offloading.  See him back next week.  Consider return to the operating room for possible further grafting.  Return in about 1 week (around 01/23/2020).  [name]

## 2020-01-23 ENCOUNTER — Ambulatory Visit (INDEPENDENT_AMBULATORY_CARE_PROVIDER_SITE_OTHER): Payer: Medicare HMO | Admitting: Podiatry

## 2020-01-23 ENCOUNTER — Other Ambulatory Visit: Payer: Self-pay

## 2020-01-23 DIAGNOSIS — E1142 Type 2 diabetes mellitus with diabetic polyneuropathy: Secondary | ICD-10-CM

## 2020-01-23 DIAGNOSIS — L97529 Non-pressure chronic ulcer of other part of left foot with unspecified severity: Secondary | ICD-10-CM

## 2020-01-25 ENCOUNTER — Ambulatory Visit: Payer: Medicare HMO | Admitting: Podiatry

## 2020-01-30 ENCOUNTER — Other Ambulatory Visit: Payer: Self-pay

## 2020-01-30 ENCOUNTER — Encounter: Payer: Self-pay | Admitting: Podiatry

## 2020-01-30 ENCOUNTER — Ambulatory Visit (INDEPENDENT_AMBULATORY_CARE_PROVIDER_SITE_OTHER): Payer: Medicare HMO | Admitting: Podiatry

## 2020-01-30 DIAGNOSIS — E1142 Type 2 diabetes mellitus with diabetic polyneuropathy: Secondary | ICD-10-CM

## 2020-01-30 DIAGNOSIS — I739 Peripheral vascular disease, unspecified: Secondary | ICD-10-CM

## 2020-01-30 DIAGNOSIS — L97529 Non-pressure chronic ulcer of other part of left foot with unspecified severity: Secondary | ICD-10-CM | POA: Diagnosis not present

## 2020-01-30 NOTE — Progress Notes (Signed)
°  Subjective:  Patient ID: Maurice Nguyen, male    DOB: 05-Jul-1966,  MRN: [id number]  Chief Complaint  Patient presents with   Wound Check    Pt states healing well without any concerns. Denies fever/chills/nausea/vomiting.    54 y.o. male presents with the above complaint. History confirmed with patient.  Seen today on behalf of Dr. Ardelle Anton.  Feels well has been changing dressing at home.  Had some pain.  Thinks it is improved.  Objective:  Physical Exam: warm, good capillary refill and ulceration at inferior left heel. Left Foot: Full-thickness ulceration measuring 19 mm x 21 mm, with primarily granular wound bed with fibrotic plug on lateral portion.  Progressive epithelialization of wound margins since last visit see photo below      Assessment:   1. Ulcer of left foot, unspecified ulcer stage (HCC)   2. Diabetic peripheral neuropathy associated with type 2 diabetes mellitus (HCC)   3. PAD (peripheral artery disease) (HCC)      Plan:  Patient was evaluated and treated and all questions answered.  Wound debrided as per procedure note below.  Continue current home wound care plan. Follow-up with Dr. Loreta Ave next week  Ulcer left foot -Debridement as below. -Dressed with Iodosorb, DSD. -Continue off-loading with surgical shoe.  Procedure: Excisional Debridement of Wound Rationale: Removal of non-viable soft tissue from the wound to promote healing.  Anesthesia: none Pre-Debridement Wound Measurements: 1.9 cm x 2.1 cm x 0.3 cm  Post-Debridement Wound Measurements: 1.9 cm x 2.1 cm x 0.3 cm  Type of Debridement: Sharp selective Tissue Removed: Non-viable soft tissue Depth of Debridement: subcutaneous tissue. Technique: Sharp selective debridement to bleeding, viable wound base.  Dressing: Dry, sterile, compression dressing. Disposition: Patient tolerated procedure well. Patient to return in 1 week for follow-up.  Return in about 1 week (around 02/06/2020) for With Dr.  Loreta Ave.

## 2020-02-03 NOTE — Progress Notes (Signed)
Subjective: Maurice Nguyen is a 54 y.o. is seen today in office s/p left heel wound debridement, ACell application.  His wife feels that the wound is healing.  Denies any drainage or pus or any swelling.  Gets occasional discomfort. Denies any systemic concerns including fevers, chills, nausea, vomiting.  No calf pain, chest pain, shortness of breath.   Objective: General: No acute distress, AAOx3 -wife is present DP/PT pulses palpable 2/4, CRT < 3 sec to all digits.  LEFT foot: Plantar lateral midfoot wound has healed.  The wound on the plantar aspect of the heel is doing better.  See measurements, picture below.  Small meta fibrotic tissue.  There is no surrounding erythema, ascending cellulitis there is no fluctuation crepitation there is no malodor. No other open lesions or pre-ulcerative lesions.  No pain with calf compression, swelling, warmth, erythema.       Assessment and Plan:  Status post left foot wound debridement, ACell application, doing well with no complications   -Treatment options discussed including all alternatives, risks, and complications -Debrided some of the fibrotic tissue off the wound today utilizing a tissue nipper down to bleeding tissue.  It was redressed today with Adaptic and a small amount of petroleum jelly.  A dry sterile dressing was then applied.  Continue with surgical boot, elevation.  He is in a follow-up next week with Dr. Lilian Kapur if there is no improvement will schedule for repeat graft application otherwise I will see him back the following week. -Monitor for any clinical signs or symptoms of infection and directed to call the office immediately should any occur or go to the ER.  [name]

## 2020-02-04 ENCOUNTER — Ambulatory Visit: Payer: Medicare HMO | Admitting: Podiatry

## 2020-02-04 ENCOUNTER — Other Ambulatory Visit: Payer: Self-pay

## 2020-02-04 ENCOUNTER — Encounter: Payer: Self-pay | Admitting: Podiatry

## 2020-02-04 ENCOUNTER — Ambulatory Visit (INDEPENDENT_AMBULATORY_CARE_PROVIDER_SITE_OTHER): Payer: Medicare HMO | Admitting: Podiatry

## 2020-02-04 DIAGNOSIS — E1142 Type 2 diabetes mellitus with diabetic polyneuropathy: Secondary | ICD-10-CM

## 2020-02-04 DIAGNOSIS — L97529 Non-pressure chronic ulcer of other part of left foot with unspecified severity: Secondary | ICD-10-CM | POA: Diagnosis not present

## 2020-02-06 ENCOUNTER — Telehealth: Payer: Self-pay | Admitting: Podiatry

## 2020-02-06 NOTE — Telephone Encounter (Signed)
Jocelyn from home health called and needed frequency of changes please assist

## 2020-02-06 NOTE — Telephone Encounter (Signed)
Maurice Nguyen- can you please call her? I would like it done 3 times a week but I have been seeing him once a week and they have been coming once a week as well.

## 2020-02-06 NOTE — Telephone Encounter (Signed)
Jocelyn from home health called back and stated that pt in desperate need of supplies home health needs order faxed needing frequency of changes wound asesments duration of needas well as pt [place] information faxed to [phone] jocelyn santos [phone] please assist

## 2020-02-11 ENCOUNTER — Other Ambulatory Visit: Payer: Self-pay

## 2020-02-11 ENCOUNTER — Ambulatory Visit (INDEPENDENT_AMBULATORY_CARE_PROVIDER_SITE_OTHER): Payer: Medicare HMO | Admitting: Podiatry

## 2020-02-11 DIAGNOSIS — E1142 Type 2 diabetes mellitus with diabetic polyneuropathy: Secondary | ICD-10-CM

## 2020-02-11 DIAGNOSIS — L97529 Non-pressure chronic ulcer of other part of left foot with unspecified severity: Secondary | ICD-10-CM

## 2020-02-11 NOTE — Progress Notes (Signed)
Subjective: Maurice Nguyen is a 54 y.o. is seen today in office s/p left heel wound debridement, ACell application.  He has been doing well.  Denies any drainage or pus or any swelling or redness.  Gets occasional discomfort still but overall feels that it is improving.  Denies any systemic concerns including fevers, chills, nausea, vomiting.  No calf pain, chest pain, shortness of breath.   Objective: General: No acute distress, AAOx3 -wife is present DP/PT pulses palpable 2/4, CRT < 3 sec to all digits.  LEFT foot: Plantar lateral midfoot wound has healed.  Ulceration to the heel remains but appears to be more superficial.  There is still a fibrotic plug evident.  There is no surrounding erythema, ascending cellulitis but there is no drainage or pus.  This area.  There is too much moisture on the bandage.  Appears that there was a lot of petroleum jelly. No other open lesions or pre-ulcerative lesions.  No pain with calf compression, swelling, warmth, erythema.         Assessment and Plan:  Status post left foot wound debridement, ACell application, doing well with no complications   -Treatment options discussed including all alternatives, risks, and complications -I was able to debride some of the fibrotic tissue without any complications none healthy, bleeding, viable tissue utilizing a tissue nipper as well as 312 with scalpel.  Continue with Adaptic and a small amount of petroleum dressing changes daily.  Continue offloading. Monitor for any clinical signs or symptoms of infection and directed to call the office immediately should any occur or go to the ER.  Return in about 1 week (around 02/11/2020).   [name]

## 2020-02-13 ENCOUNTER — Telehealth: Payer: Self-pay | Admitting: Podiatry

## 2020-02-13 NOTE — Telephone Encounter (Signed)
Called and spoke with tucker at [facility] and gave the measurements, and the ID [facility] card and how often to change which is daily and the supplies will be ordered. Misty Stanley

## 2020-02-13 NOTE — Telephone Encounter (Signed)
Maurice Nguyen from [facility] has called stating he needs information missing on order please assist

## 2020-02-17 NOTE — Progress Notes (Signed)
Subjective: Maurice Nguyen is a 54 y.o. is seen today in office s/p left heel wound debridement, ACell application.  Continues to do well no new concerns.  Home health is still coming out.  Denies any fevers, chills, nausea, vomiting.  Objective: General: No acute distress, AAOx3 -wife is present LEFT foot: Plantar lateral midfoot wound has healed.  Ulceration to the heel still evident with a small amount of fibrotic tissue but the rest of the wound is granular.  Appear to be smaller in size.  More superficial.  No surrounding erythema, ascending cellulitis.  No fluctuance or crepitation.  There is no malodor.  No other open lesions or pre-ulcerative lesions.  No pain with calf compression, swelling, warmth, erythema.       Assessment and Plan:  Status post left foot wound debridement, ACell application, doing well with no complications   -Treatment options discussed including all alternatives, risks, and complications -I sharply debrided the wound today to try to remove fibrotic tissue utilizing the 312 with scalpel as well as a tissue nipper down to healthy, viable, bleeding tissue without complications.  Tolerated well.  Continue the same therapy with Adaptic and small amount of petroleum jelly.  Continue offloading. -Monitor for any clinical signs or symptoms of infection and directed to call the office immediately should any occur or go to the ER.  Return in about 1 week (around 02/17/2020).  [name]

## 2020-02-18 ENCOUNTER — Inpatient Hospital Stay (HOSPITAL_COMMUNITY): Payer: Medicare HMO

## 2020-02-18 ENCOUNTER — Encounter (HOSPITAL_COMMUNITY): Admission: EM | Disposition: E | Payer: Self-pay | Source: Home / Self Care | Attending: Pulmonary Disease

## 2020-02-18 ENCOUNTER — Ambulatory Visit: Payer: Medicare HMO | Admitting: Podiatry

## 2020-02-18 ENCOUNTER — Inpatient Hospital Stay (HOSPITAL_COMMUNITY)
Admission: EM | Admit: 2020-02-18 | Discharge: 2020-03-26 | DRG: 907 | Disposition: E | Payer: Medicare HMO | Attending: Internal Medicine | Admitting: Internal Medicine

## 2020-02-18 ENCOUNTER — Emergency Department (HOSPITAL_COMMUNITY): Payer: Medicare HMO

## 2020-02-18 DIAGNOSIS — E785 Hyperlipidemia, unspecified: Secondary | ICD-10-CM | POA: Diagnosis present

## 2020-02-18 DIAGNOSIS — J690 Pneumonitis due to inhalation of food and vomit: Secondary | ICD-10-CM | POA: Diagnosis not present

## 2020-02-18 DIAGNOSIS — J95851 Ventilator associated pneumonia: Secondary | ICD-10-CM | POA: Diagnosis not present

## 2020-02-18 DIAGNOSIS — T17908A Unspecified foreign body in respiratory tract, part unspecified causing other injury, initial encounter: Secondary | ICD-10-CM

## 2020-02-18 DIAGNOSIS — Z7189 Other specified counseling: Secondary | ICD-10-CM | POA: Diagnosis not present

## 2020-02-18 DIAGNOSIS — I480 Paroxysmal atrial fibrillation: Secondary | ICD-10-CM | POA: Diagnosis present

## 2020-02-18 DIAGNOSIS — Z8669 Personal history of other diseases of the nervous system and sense organs: Secondary | ICD-10-CM

## 2020-02-18 DIAGNOSIS — G9341 Metabolic encephalopathy: Secondary | ICD-10-CM | POA: Diagnosis present

## 2020-02-18 DIAGNOSIS — F1410 Cocaine abuse, uncomplicated: Secondary | ICD-10-CM | POA: Diagnosis present

## 2020-02-18 DIAGNOSIS — G931 Anoxic brain damage, not elsewhere classified: Secondary | ICD-10-CM | POA: Diagnosis present

## 2020-02-18 DIAGNOSIS — I493 Ventricular premature depolarization: Secondary | ICD-10-CM | POA: Diagnosis not present

## 2020-02-18 DIAGNOSIS — Z66 Do not resuscitate: Secondary | ICD-10-CM | POA: Diagnosis not present

## 2020-02-18 DIAGNOSIS — I4891 Unspecified atrial fibrillation: Secondary | ICD-10-CM | POA: Diagnosis not present

## 2020-02-18 DIAGNOSIS — I469 Cardiac arrest, cause unspecified: Secondary | ICD-10-CM

## 2020-02-18 DIAGNOSIS — J9690 Respiratory failure, unspecified, unspecified whether with hypoxia or hypercapnia: Secondary | ICD-10-CM

## 2020-02-18 DIAGNOSIS — I130 Hypertensive heart and chronic kidney disease with heart failure and stage 1 through stage 4 chronic kidney disease, or unspecified chronic kidney disease: Secondary | ICD-10-CM | POA: Diagnosis present

## 2020-02-18 DIAGNOSIS — T405X1A Poisoning by cocaine, accidental (unintentional), initial encounter: Secondary | ICD-10-CM | POA: Diagnosis present

## 2020-02-18 DIAGNOSIS — Z20822 Contact with and (suspected) exposure to covid-19: Secondary | ICD-10-CM | POA: Diagnosis present

## 2020-02-18 DIAGNOSIS — I472 Ventricular tachycardia: Secondary | ICD-10-CM | POA: Diagnosis present

## 2020-02-18 DIAGNOSIS — I4901 Ventricular fibrillation: Secondary | ICD-10-CM | POA: Diagnosis present

## 2020-02-18 DIAGNOSIS — E1165 Type 2 diabetes mellitus with hyperglycemia: Secondary | ICD-10-CM | POA: Diagnosis present

## 2020-02-18 DIAGNOSIS — L97529 Non-pressure chronic ulcer of other part of left foot with unspecified severity: Secondary | ICD-10-CM | POA: Diagnosis present

## 2020-02-18 DIAGNOSIS — Z452 Encounter for adjustment and management of vascular access device: Secondary | ICD-10-CM

## 2020-02-18 DIAGNOSIS — J9602 Acute respiratory failure with hypercapnia: Secondary | ICD-10-CM | POA: Diagnosis not present

## 2020-02-18 DIAGNOSIS — Y92009 Unspecified place in unspecified non-institutional (private) residence as the place of occurrence of the external cause: Secondary | ICD-10-CM

## 2020-02-18 DIAGNOSIS — E1122 Type 2 diabetes mellitus with diabetic chronic kidney disease: Secondary | ICD-10-CM | POA: Diagnosis present

## 2020-02-18 DIAGNOSIS — N179 Acute kidney failure, unspecified: Secondary | ICD-10-CM | POA: Diagnosis not present

## 2020-02-18 DIAGNOSIS — G4733 Obstructive sleep apnea (adult) (pediatric): Secondary | ICD-10-CM | POA: Diagnosis present

## 2020-02-18 DIAGNOSIS — Z9861 Coronary angioplasty status: Secondary | ICD-10-CM

## 2020-02-18 DIAGNOSIS — Z794 Long term (current) use of insulin: Secondary | ICD-10-CM

## 2020-02-18 DIAGNOSIS — Z711 Person with feared health complaint in whom no diagnosis is made: Secondary | ICD-10-CM

## 2020-02-18 DIAGNOSIS — R6339 Other feeding difficulties: Secondary | ICD-10-CM

## 2020-02-18 DIAGNOSIS — G40901 Epilepsy, unspecified, not intractable, with status epilepticus: Secondary | ICD-10-CM | POA: Diagnosis not present

## 2020-02-18 DIAGNOSIS — K7200 Acute and subacute hepatic failure without coma: Secondary | ICD-10-CM | POA: Diagnosis present

## 2020-02-18 DIAGNOSIS — Z4659 Encounter for fitting and adjustment of other gastrointestinal appliance and device: Secondary | ICD-10-CM

## 2020-02-18 DIAGNOSIS — R569 Unspecified convulsions: Secondary | ICD-10-CM | POA: Diagnosis not present

## 2020-02-18 DIAGNOSIS — J9601 Acute respiratory failure with hypoxia: Secondary | ICD-10-CM

## 2020-02-18 DIAGNOSIS — I428 Other cardiomyopathies: Secondary | ICD-10-CM | POA: Diagnosis present

## 2020-02-18 DIAGNOSIS — D649 Anemia, unspecified: Secondary | ICD-10-CM | POA: Diagnosis not present

## 2020-02-18 DIAGNOSIS — Z8701 Personal history of pneumonia (recurrent): Secondary | ICD-10-CM

## 2020-02-18 DIAGNOSIS — R570 Cardiogenic shock: Secondary | ICD-10-CM | POA: Diagnosis present

## 2020-02-18 DIAGNOSIS — Z79899 Other long term (current) drug therapy: Secondary | ICD-10-CM

## 2020-02-18 DIAGNOSIS — J9600 Acute respiratory failure, unspecified whether with hypoxia or hypercapnia: Secondary | ICD-10-CM

## 2020-02-18 DIAGNOSIS — I4892 Unspecified atrial flutter: Secondary | ICD-10-CM | POA: Diagnosis present

## 2020-02-18 DIAGNOSIS — I77810 Thoracic aortic ectasia: Secondary | ICD-10-CM | POA: Diagnosis present

## 2020-02-18 DIAGNOSIS — R680 Hypothermia, not associated with low environmental temperature: Secondary | ICD-10-CM | POA: Diagnosis present

## 2020-02-18 DIAGNOSIS — E8809 Other disorders of plasma-protein metabolism, not elsewhere classified: Secondary | ICD-10-CM | POA: Diagnosis present

## 2020-02-18 DIAGNOSIS — A419 Sepsis, unspecified organism: Secondary | ICD-10-CM | POA: Diagnosis not present

## 2020-02-18 DIAGNOSIS — N170 Acute kidney failure with tubular necrosis: Secondary | ICD-10-CM | POA: Diagnosis not present

## 2020-02-18 DIAGNOSIS — I5043 Acute on chronic combined systolic (congestive) and diastolic (congestive) heart failure: Secondary | ICD-10-CM | POA: Diagnosis present

## 2020-02-18 DIAGNOSIS — I468 Cardiac arrest due to other underlying condition: Secondary | ICD-10-CM | POA: Diagnosis present

## 2020-02-18 DIAGNOSIS — E870 Hyperosmolality and hypernatremia: Secondary | ICD-10-CM | POA: Diagnosis not present

## 2020-02-18 DIAGNOSIS — N182 Chronic kidney disease, stage 2 (mild): Secondary | ICD-10-CM | POA: Diagnosis present

## 2020-02-18 DIAGNOSIS — E1140 Type 2 diabetes mellitus with diabetic neuropathy, unspecified: Secondary | ICD-10-CM | POA: Diagnosis present

## 2020-02-18 DIAGNOSIS — E872 Acidosis: Secondary | ICD-10-CM | POA: Diagnosis present

## 2020-02-18 DIAGNOSIS — Z6841 Body Mass Index (BMI) 40.0 and over, adult: Secondary | ICD-10-CM | POA: Diagnosis not present

## 2020-02-18 DIAGNOSIS — E876 Hypokalemia: Secondary | ICD-10-CM | POA: Diagnosis not present

## 2020-02-18 DIAGNOSIS — E11621 Type 2 diabetes mellitus with foot ulcer: Secondary | ICD-10-CM | POA: Diagnosis present

## 2020-02-18 DIAGNOSIS — G9340 Encephalopathy, unspecified: Secondary | ICD-10-CM | POA: Diagnosis not present

## 2020-02-18 DIAGNOSIS — E1169 Type 2 diabetes mellitus with other specified complication: Secondary | ICD-10-CM | POA: Diagnosis present

## 2020-02-18 DIAGNOSIS — E1151 Type 2 diabetes mellitus with diabetic peripheral angiopathy without gangrene: Secondary | ICD-10-CM | POA: Diagnosis present

## 2020-02-18 DIAGNOSIS — L8990 Pressure ulcer of unspecified site, unspecified stage: Secondary | ICD-10-CM | POA: Insufficient documentation

## 2020-02-18 DIAGNOSIS — Z515 Encounter for palliative care: Secondary | ICD-10-CM | POA: Diagnosis not present

## 2020-02-18 DIAGNOSIS — I878 Other specified disorders of veins: Secondary | ICD-10-CM | POA: Diagnosis present

## 2020-02-18 HISTORY — PX: TEMPORARY PACEMAKER: CATH118268

## 2020-02-18 LAB — POCT I-STAT 7, (LYTES, BLD GAS, ICA,H+H)
Acid-base deficit: 3 mmol/L — ABNORMAL HIGH (ref 0.0–2.0)
Bicarbonate: 22 mmol/L (ref 20.0–28.0)
Calcium, Ion: 1.04 mmol/L — ABNORMAL LOW (ref 1.15–1.40)
HCT: 45 % (ref 39.0–52.0)
Hemoglobin: 15.3 g/dL (ref 13.0–17.0)
O2 Saturation: 99 %
Patient temperature: 36.2
Potassium: 4.6 mmol/L (ref 3.5–5.1)
Sodium: 138 mmol/L (ref 135–145)
TCO2: 23 mmol/L (ref 22–32)
pCO2 arterial: 38.4 mmHg (ref 32.0–48.0)
pH, Arterial: 7.362 (ref 7.350–7.450)
pO2, Arterial: 117 mmHg — ABNORMAL HIGH (ref 83.0–108.0)

## 2020-02-18 LAB — COMPREHENSIVE METABOLIC PANEL
ALT: 641 U/L — ABNORMAL HIGH (ref 0–44)
AST: 785 U/L — ABNORMAL HIGH (ref 15–41)
Albumin: 2.9 g/dL — ABNORMAL LOW (ref 3.5–5.0)
Alkaline Phosphatase: 124 U/L (ref 38–126)
Anion gap: 18 — ABNORMAL HIGH (ref 5–15)
BUN: 25 mg/dL — ABNORMAL HIGH (ref 6–20)
CO2: 17 mmol/L — ABNORMAL LOW (ref 22–32)
Calcium: 8.2 mg/dL — ABNORMAL LOW (ref 8.9–10.3)
Chloride: 101 mmol/L (ref 98–111)
Creatinine, Ser: 1.61 mg/dL — ABNORMAL HIGH (ref 0.61–1.24)
GFR calc Af Amer: 55 mL/min — ABNORMAL LOW (ref >60)
GFR calc non Af Amer: 47 mL/min — ABNORMAL LOW (ref >60)
Glucose, Bld: 306 mg/dL — ABNORMAL HIGH (ref 70–99)
Potassium: 5.5 mmol/L — ABNORMAL HIGH (ref 3.5–5.1)
Sodium: 136 mmol/L (ref 135–145)
Total Bilirubin: 1.7 mg/dL — ABNORMAL HIGH (ref 0.3–1.2)
Total Protein: 6.1 g/dL — ABNORMAL LOW (ref 6.5–8.1)

## 2020-02-18 LAB — ECHOCARDIOGRAM COMPLETE
Height: 74 in
S' Lateral: 4 cm
Weight: 6529.14 oz

## 2020-02-18 LAB — CBC
HCT: 44 % (ref 39.0–52.0)
Hemoglobin: 14.4 g/dL (ref 13.0–17.0)
MCH: 31.4 pg (ref 26.0–34.0)
MCHC: 32.7 g/dL (ref 30.0–36.0)
MCV: 95.9 fL (ref 80.0–100.0)
Platelets: 214 10*3/uL (ref 150–400)
RBC: 4.59 MIL/uL (ref 4.22–5.81)
RDW: 13.7 % (ref 11.5–15.5)
WBC: 18 10*3/uL — ABNORMAL HIGH (ref 4.0–10.5)
nRBC: 0.1 % (ref 0.0–0.2)

## 2020-02-18 LAB — TROPONIN I (HIGH SENSITIVITY)
Troponin I (High Sensitivity): 169 ng/L (ref <18)
Troponin I (High Sensitivity): 7310 ng/L (ref <18)

## 2020-02-18 LAB — HEMOGLOBIN A1C
Hgb A1c MFr Bld: 8.1 % — ABNORMAL HIGH (ref 4.8–5.6)
Mean Plasma Glucose: 185.77 mg/dL

## 2020-02-18 LAB — CBC WITH DIFFERENTIAL/PLATELET
Abs Immature Granulocytes: 2.21 10*3/uL — ABNORMAL HIGH (ref 0.00–0.07)
Basophils Absolute: 0.2 10*3/uL — ABNORMAL HIGH (ref 0.0–0.1)
Basophils Relative: 1 %
Eosinophils Absolute: 0.2 10*3/uL (ref 0.0–0.5)
Eosinophils Relative: 1 %
HCT: 46.8 % (ref 39.0–52.0)
Hemoglobin: 14.5 g/dL (ref 13.0–17.0)
Immature Granulocytes: 8 %
Lymphocytes Relative: 29 %
Lymphs Abs: 7.7 10*3/uL — ABNORMAL HIGH (ref 0.7–4.0)
MCH: 32.2 pg (ref 26.0–34.0)
MCHC: 31 g/dL (ref 30.0–36.0)
MCV: 103.8 fL — ABNORMAL HIGH (ref 80.0–100.0)
Monocytes Absolute: 0.4 10*3/uL (ref 0.1–1.0)
Monocytes Relative: 2 %
Neutro Abs: 15.7 10*3/uL — ABNORMAL HIGH (ref 1.7–7.7)
Neutrophils Relative %: 59 %
Platelets: 221 10*3/uL (ref 150–400)
RBC: 4.51 MIL/uL (ref 4.22–5.81)
RDW: 14 % (ref 11.5–15.5)
WBC: 26.4 10*3/uL — ABNORMAL HIGH (ref 4.0–10.5)
nRBC: 0.5 % — ABNORMAL HIGH (ref 0.0–0.2)

## 2020-02-18 LAB — I-STAT ARTERIAL BLOOD GAS, ED
Acid-base deficit: 4 mmol/L — ABNORMAL HIGH (ref 0.0–2.0)
Bicarbonate: 24.2 mmol/L (ref 20.0–28.0)
Calcium, Ion: 1.12 mmol/L — ABNORMAL LOW (ref 1.15–1.40)
HCT: 45 % (ref 39.0–52.0)
Hemoglobin: 15.3 g/dL (ref 13.0–17.0)
O2 Saturation: 99 %
Patient temperature: 83.4
Potassium: 4.3 mmol/L (ref 3.5–5.1)
Sodium: 136 mmol/L (ref 135–145)
TCO2: 26 mmol/L (ref 22–32)
pCO2 arterial: 36.5 mmHg (ref 32.0–48.0)
pH, Arterial: 7.385 (ref 7.350–7.450)
pO2, Arterial: 123 mmHg — ABNORMAL HIGH (ref 83.0–108.0)

## 2020-02-18 LAB — GLUCOSE, CAPILLARY
Glucose-Capillary: 282 mg/dL — ABNORMAL HIGH (ref 70–99)
Glucose-Capillary: 226 mg/dL — ABNORMAL HIGH (ref 70–99)

## 2020-02-18 LAB — BRAIN NATRIURETIC PEPTIDE: B Natriuretic Peptide: 158.1 pg/mL — ABNORMAL HIGH (ref 0.0–100.0)

## 2020-02-18 LAB — BASIC METABOLIC PANEL
Anion gap: 14 (ref 5–15)
BUN: 35 mg/dL — ABNORMAL HIGH (ref 6–20)
CO2: 23 mmol/L (ref 22–32)
Calcium: 8.3 mg/dL — ABNORMAL LOW (ref 8.9–10.3)
Chloride: 101 mmol/L (ref 98–111)
Creatinine, Ser: 2.08 mg/dL — ABNORMAL HIGH (ref 0.61–1.24)
GFR calc Af Amer: 40 mL/min — ABNORMAL LOW (ref >60)
GFR calc non Af Amer: 35 mL/min — ABNORMAL LOW (ref >60)
Glucose, Bld: 305 mg/dL — ABNORMAL HIGH (ref 70–99)
Potassium: 3.7 mmol/L (ref 3.5–5.1)
Sodium: 138 mmol/L (ref 135–145)

## 2020-02-18 LAB — I-STAT CHEM 8, ED
BUN: 41 mg/dL — ABNORMAL HIGH (ref 6–20)
Calcium, Ion: 0.96 mmol/L — ABNORMAL LOW (ref 1.15–1.40)
Chloride: 101 mmol/L (ref 98–111)
Creatinine, Ser: 1.3 mg/dL — ABNORMAL HIGH (ref 0.61–1.24)
Glucose, Bld: 312 mg/dL — ABNORMAL HIGH (ref 70–99)
HCT: 48 % (ref 39.0–52.0)
Hemoglobin: 16.3 g/dL (ref 13.0–17.0)
Potassium: 5.4 mmol/L — ABNORMAL HIGH (ref 3.5–5.1)
Sodium: 138 mmol/L (ref 135–145)
TCO2: 21 mmol/L — ABNORMAL LOW (ref 22–32)

## 2020-02-18 LAB — APTT: aPTT: 33 seconds (ref 24–36)

## 2020-02-18 LAB — SARS CORONAVIRUS 2 BY RT PCR (HOSPITAL ORDER, PERFORMED IN ~~LOC~~ HOSPITAL LAB): SARS Coronavirus 2: NEGATIVE

## 2020-02-18 LAB — PROTIME-INR
INR: 1.4 — ABNORMAL HIGH (ref 0.8–1.2)
Prothrombin Time: 17 seconds — ABNORMAL HIGH (ref 11.4–15.2)

## 2020-02-18 LAB — LACTIC ACID, PLASMA
Lactic Acid, Venous: 7.1 mmol/L (ref 0.5–1.9)
Lactic Acid, Venous: 2.9 mmol/L (ref 0.5–1.9)

## 2020-02-18 LAB — CBG MONITORING, ED: Glucose-Capillary: 197 mg/dL — ABNORMAL HIGH (ref 70–99)

## 2020-02-18 LAB — HEPARIN LEVEL (UNFRACTIONATED): Heparin Unfractionated: <0.1 IU/mL — ABNORMAL LOW (ref 0.30–0.70)

## 2020-02-18 SURGERY — TEMPORARY PACEMAKER
Anesthesia: LOCAL

## 2020-02-18 MED ORDER — PROPOFOL 1000 MG/100ML IV EMUL: 25.0000 ug/kg/min | INTRAVENOUS | Status: DC

## 2020-02-18 MED ORDER — MIDAZOLAM 50MG/50ML (1MG/ML) PREMIX INFUSION: 2.0000 mg/h | INTRAVENOUS | Status: DC

## 2020-02-18 MED ORDER — FENTANYL CITRATE (PF) 100 MCG/2ML IJ SOLN: 100.0000 ug | Freq: Once | INTRAMUSCULAR | Status: DC

## 2020-02-18 MED ORDER — ETOMIDATE 2 MG/ML IV SOLN
INTRAVENOUS | Status: AC | PRN
  Administered 2020-02-18: 25 mg via INTRAVENOUS

## 2020-02-18 MED ORDER — FENTANYL 2500MCG IN NS 250ML (10MCG/ML) PREMIX INFUSION
0.0000 ug/h | INTRAVENOUS | Status: DC
  Administered 2020-02-19 (×2): 25 ug/h via INTRAVENOUS
  Filled 2020-02-18 (×2): qty 250

## 2020-02-18 MED ORDER — VANCOMYCIN HCL IN DEXTROSE 1-5 GM/200ML-% IV SOLN: 1000.0000 mg | Freq: Once | INTRAVENOUS | Status: DC

## 2020-02-18 MED ORDER — SODIUM CHLORIDE 0.9% FLUSH: 3.0000 mL | INTRAVENOUS | Status: DC | PRN

## 2020-02-18 MED ORDER — SODIUM CHLORIDE 0.9 % IV SOLN: INTRAVENOUS | Status: DC

## 2020-02-18 MED ORDER — ORAL CARE MOUTH RINSE
15.0000 mL | OROMUCOSAL | Status: DC
  Administered 2020-02-18 – 2020-02-27 (×87): 15 mL via OROMUCOSAL

## 2020-02-18 MED ORDER — PANTOPRAZOLE SODIUM 40 MG PO PACK
40.0000 mg | PACK | Freq: Every day | ORAL | Status: DC
  Administered 2020-02-19 – 2020-02-20 (×2): 40 mg
  Filled 2020-02-18 (×3): qty 20

## 2020-02-18 MED ORDER — LIDOCAINE HCL (PF) 1 % IJ SOLN
INTRAMUSCULAR | Status: DC | PRN
  Administered 2020-02-18: 5 mL via INTRADERMAL

## 2020-02-18 MED ORDER — LIDOCAINE HCL (PF) 1 % IJ SOLN
INTRAMUSCULAR | Status: AC
  Filled 2020-02-18: qty 30

## 2020-02-18 MED ORDER — HEPARIN (PORCINE) 25000 UT/250ML-% IV SOLN: 1050.0000 [IU]/h | INTRAVENOUS | Status: DC

## 2020-02-18 MED ORDER — NOREPINEPHRINE 4 MG/250ML-% IV SOLN
INTRAVENOUS | Status: AC
  Administered 2020-02-18: 10 ug/min via INTRAVENOUS
  Filled 2020-02-18: qty 250

## 2020-02-18 MED ORDER — FENTANYL 2500MCG IN NS 250ML (10MCG/ML) PREMIX INFUSION
0.0000 ug/h | INTRAVENOUS | Status: DC
  Filled 2020-02-18: qty 250

## 2020-02-18 MED ORDER — FENTANYL BOLUS VIA INFUSION
50.0000 ug | INTRAVENOUS | Status: DC | PRN
  Filled 2020-02-18: qty 50

## 2020-02-18 MED ORDER — ASPIRIN 81 MG PO CHEW: 81.0000 mg | CHEWABLE_TABLET | ORAL | Status: DC

## 2020-02-18 MED ORDER — FENTANYL 2500MCG IN NS 250ML (10MCG/ML) PREMIX INFUSION: 100.0000 ug/h | INTRAVENOUS | Status: DC

## 2020-02-18 MED ORDER — NOREPINEPHRINE 4 MG/250ML-% IV SOLN: 0.0000 ug/min | INTRAVENOUS | Status: DC

## 2020-02-18 MED ORDER — FENTANYL 2500MCG IN NS 250ML (10MCG/ML) PREMIX INFUSION
50.0000 ug/h | INTRAVENOUS | Status: DC
  Administered 2020-02-18: 50 ug/h via INTRAVENOUS

## 2020-02-18 MED ORDER — HEPARIN (PORCINE) IN NACL 1000-0.9 UT/500ML-% IV SOLN
INTRAVENOUS | Status: AC
  Filled 2020-02-18: qty 500

## 2020-02-18 MED ORDER — NOREPINEPHRINE 4 MG/250ML-% IV SOLN
0.0000 ug/min | INTRAVENOUS | Status: DC
  Filled 2020-02-18: qty 250

## 2020-02-18 MED ORDER — INSULIN ASPART 100 UNIT/ML ~~LOC~~ SOLN
0.0000 [IU] | SUBCUTANEOUS | Status: DC
  Administered 2020-02-18 (×2): 2 [IU] via SUBCUTANEOUS
  Administered 2020-02-18: 3 [IU] via SUBCUTANEOUS
  Administered 2020-02-19 (×2): 1 [IU] via SUBCUTANEOUS

## 2020-02-18 MED ORDER — MIDAZOLAM HCL 2 MG/2ML IJ SOLN: 1.0000 mg | Freq: Once | INTRAMUSCULAR | Status: DC

## 2020-02-18 MED ORDER — SODIUM CHLORIDE 0.9 % IV SOLN: 250.0000 mL | INTRAVENOUS | Status: DC

## 2020-02-18 MED ORDER — CHLORHEXIDINE GLUCONATE 0.12% ORAL RINSE (MEDLINE KIT)
15.0000 mL | Freq: Two times a day (BID) | OROMUCOSAL | Status: DC
  Administered 2020-02-18 – 2020-02-27 (×18): 15 mL via OROMUCOSAL

## 2020-02-18 MED ORDER — SODIUM CHLORIDE 0.9 % IV SOLN
2.0000 g | Freq: Three times a day (TID) | INTRAVENOUS | Status: DC
  Administered 2020-02-18 – 2020-02-19 (×2): 2 g via INTRAVENOUS
  Filled 2020-02-18 (×4): qty 2

## 2020-02-18 MED ORDER — CHLORHEXIDINE GLUCONATE CLOTH 2 % EX PADS
6.0000 | MEDICATED_PAD | Freq: Every day | CUTANEOUS | Status: DC
  Administered 2020-02-18 – 2020-02-27 (×11): 6 via TOPICAL

## 2020-02-18 MED ORDER — POLYETHYLENE GLYCOL 3350 17 G PO PACK: 17.0000 g | PACK | Freq: Every day | ORAL | Status: DC

## 2020-02-18 MED ORDER — ASPIRIN 300 MG RE SUPP: 300.0000 mg | RECTAL | Status: DC

## 2020-02-18 MED ORDER — HEPARIN (PORCINE) 25000 UT/250ML-% IV SOLN
2300.0000 [IU]/h | INTRAVENOUS | Status: DC
  Administered 2020-02-18: 1050 [IU]/h via INTRAVENOUS
  Administered 2020-02-19: 1300 [IU]/h via INTRAVENOUS
  Administered 2020-02-20: 1500 [IU]/h via INTRAVENOUS
  Administered 2020-02-21 – 2020-02-23 (×4): 1650 [IU]/h via INTRAVENOUS
  Administered 2020-02-23: 1850 [IU]/h via INTRAVENOUS
  Administered 2020-02-24: 2100 [IU]/h via INTRAVENOUS
  Administered 2020-02-24: 1850 [IU]/h via INTRAVENOUS
  Administered 2020-02-25: 2100 [IU]/h via INTRAVENOUS
  Administered 2020-02-26 – 2020-02-27 (×2): 2200 [IU]/h via INTRAVENOUS
  Filled 2020-02-18 (×16): qty 250

## 2020-02-18 MED ORDER — VANCOMYCIN HCL 1250 MG/250ML IV SOLN
1250.0000 mg | Freq: Two times a day (BID) | INTRAVENOUS | Status: DC
  Administered 2020-02-18: 1250 mg via INTRAVENOUS
  Filled 2020-02-18 (×2): qty 250

## 2020-02-18 MED ORDER — HEPARIN SODIUM (PORCINE) 5000 UNIT/ML IJ SOLN: 5000.0000 [IU] | Freq: Three times a day (TID) | INTRAMUSCULAR | Status: DC

## 2020-02-18 MED ORDER — NOREPINEPHRINE 4 MG/250ML-% IV SOLN: 2.0000 ug/min | INTRAVENOUS | Status: DC

## 2020-02-18 MED ORDER — DOCUSATE SODIUM 50 MG/5ML PO LIQD
100.0000 mg | Freq: Two times a day (BID) | ORAL | Status: DC
  Administered 2020-02-18: 100 mg via ORAL
  Filled 2020-02-18 (×3): qty 10

## 2020-02-18 MED ORDER — SODIUM BICARBONATE 8.4 % IV SOLN: 150.0000 meq | Freq: Once | INTRAVENOUS | Status: AC

## 2020-02-18 MED ORDER — SODIUM CHLORIDE 0.9% FLUSH
10.0000 mL | Freq: Two times a day (BID) | INTRAVENOUS | Status: DC
  Administered 2020-02-18 – 2020-02-20 (×5): 10 mL
  Administered 2020-02-21: 30 mL
  Administered 2020-02-21 – 2020-02-22 (×2): 10 mL
  Administered 2020-02-22: 30 mL
  Administered 2020-02-23 – 2020-02-26 (×4): 10 mL
  Administered 2020-02-27: 40 mL

## 2020-02-18 MED ORDER — SODIUM CHLORIDE 0.9% FLUSH
3.0000 mL | Freq: Two times a day (BID) | INTRAVENOUS | Status: DC
  Administered 2020-02-18 – 2020-02-22 (×9): 3 mL via INTRAVENOUS

## 2020-02-18 MED ORDER — HEPARIN SODIUM (PORCINE) 5000 UNIT/ML IJ SOLN
5000.0000 [IU] | Freq: Three times a day (TID) | INTRAMUSCULAR | Status: DC
Start: 2020-02-18 — End: 2020-02-18

## 2020-02-18 MED ORDER — MIDAZOLAM BOLUS VIA INFUSION
1.0000 mg | INTRAVENOUS | Status: DC | PRN
  Filled 2020-02-18: qty 1

## 2020-02-18 MED ORDER — SODIUM CHLORIDE 0.9 % IV SOLN
2.0000 g | Freq: Once | INTRAVENOUS | Status: AC
  Administered 2020-02-18: 2 g via INTRAVENOUS
  Filled 2020-02-18: qty 2

## 2020-02-18 MED ORDER — ROCURONIUM BROMIDE 50 MG/5ML IV SOLN
INTRAVENOUS | Status: AC | PRN
  Administered 2020-02-18: 100 mg via INTRAVENOUS

## 2020-02-18 MED ORDER — FENTANYL BOLUS VIA INFUSION
25.0000 ug | INTRAVENOUS | Status: DC | PRN
  Filled 2020-02-18: qty 25

## 2020-02-18 MED ORDER — HEPARIN (PORCINE) IN NACL 1000-0.9 UT/500ML-% IV SOLN
INTRAVENOUS | Status: DC | PRN
  Administered 2020-02-18: 500 mL

## 2020-02-18 MED ORDER — SODIUM CHLORIDE 0.9 % IV BOLUS
1000.0000 mL | Freq: Once | INTRAVENOUS | Status: AC
  Administered 2020-02-18: 1000 mL via INTRAVENOUS

## 2020-02-18 MED ORDER — VANCOMYCIN HCL 10 G IV SOLR
2500.0000 mg | Freq: Once | INTRAVENOUS | Status: AC
  Administered 2020-02-18: 2500 mg via INTRAVENOUS
  Filled 2020-02-18: qty 2500

## 2020-02-18 MED ORDER — CALCIUM GLUCONATE 10 % IV SOLN
1.0000 g | Freq: Once | INTRAVENOUS | Status: AC
  Administered 2020-02-18: 1 g via INTRAVENOUS

## 2020-02-18 MED ORDER — SODIUM CHLORIDE 0.9 % IV SOLN: 250.0000 mL | INTRAVENOUS | Status: DC | PRN

## 2020-02-18 MED ORDER — POLYETHYLENE GLYCOL 3350 17 G PO PACK: 17.0000 g | PACK | Freq: Every day | ORAL | Status: DC | PRN

## 2020-02-18 MED ORDER — NOREPINEPHRINE 16 MG/250ML-% IV SOLN
0.0000 ug/min | INTRAVENOUS | Status: DC
  Administered 2020-02-18: 25 ug/min via INTRAVENOUS
  Administered 2020-02-20: 18 ug/min via INTRAVENOUS
  Administered 2020-02-20: 20 ug/min via INTRAVENOUS
  Administered 2020-02-21: 40 ug/min via INTRAVENOUS
  Administered 2020-02-21: 41 ug/min via INTRAVENOUS
  Administered 2020-02-22: 27 ug/min via INTRAVENOUS
  Administered 2020-02-22: 26 ug/min via INTRAVENOUS
  Administered 2020-02-22: 27 ug/min via INTRAVENOUS
  Administered 2020-02-23: 23 ug/min via INTRAVENOUS
  Administered 2020-02-24: 4.053 ug/min via INTRAVENOUS
  Filled 2020-02-18 (×11): qty 250

## 2020-02-18 MED ORDER — DOCUSATE SODIUM 100 MG PO CAPS: 100.0000 mg | ORAL_CAPSULE | Freq: Two times a day (BID) | ORAL | Status: DC | PRN

## 2020-02-18 MED ORDER — SODIUM BICARBONATE 8.4 % IV SOLN
INTRAVENOUS | Status: AC
  Administered 2020-02-18: 150 meq via INTRAVENOUS
  Filled 2020-02-18: qty 150

## 2020-02-18 MED ORDER — SODIUM CHLORIDE 0.9% FLUSH: 10.0000 mL | INTRAVENOUS | Status: DC | PRN

## 2020-02-18 MED ORDER — FAMOTIDINE 40 MG/5ML PO SUSR
20.0000 mg | Freq: Two times a day (BID) | ORAL | Status: DC
  Filled 2020-02-18 (×2): qty 2.5

## 2020-02-18 MED ORDER — METRONIDAZOLE IN NACL 5-0.79 MG/ML-% IV SOLN
500.0000 mg | Freq: Once | INTRAVENOUS | Status: DC
  Filled 2020-02-18: qty 100

## 2020-02-18 MED ORDER — FENTANYL CITRATE (PF) 100 MCG/2ML IJ SOLN: 50.0000 ug | Freq: Once | INTRAMUSCULAR | Status: DC

## 2020-02-18 SURGICAL SUPPLY — 10 items
CATH S G BIP PACING (CATHETERS) ×1 IMPLANT
HOVERMATT SINGLE USE (MISCELLANEOUS) ×1 IMPLANT
PACK CARDIAC CATHETERIZATION (CUSTOM PROCEDURE TRAY) ×1 IMPLANT
PINNACLE LONG 6F 25CM (SHEATH) ×2
PROTECTION STATION PRESSURIZED (MISCELLANEOUS) ×2
SHEATH INTRO PINNACLE 6F 25CM (SHEATH) IMPLANT
SHEATH PROBE COVER 6X72 (BAG) ×1 IMPLANT
SLEEVE REPOSITIONING LENGTH 30 (MISCELLANEOUS) ×1 IMPLANT
STATION PROTECTION PRESSURIZED (MISCELLANEOUS) IMPLANT
WIRE EMERALD 3MM-J .035X150CM (WIRE) ×1 IMPLANT

## 2020-02-18 NOTE — Procedures (Signed)
Central Venous Catheter Insertion Procedure Note  NAOKI MIGLIACCIO  [id number]  12/20/1965  Date:24-Feb-2020  Time:3:33 PM   Provider Performing:Meiko Stranahan Ephriam Knuckles Supervising Provider: Joneen Roach; Attending provider: Coralyn Helling  Procedure: Insertion of Non-tunneled Central Venous [phone]) with US guidance ([id number])   Indication(s) Medication administration  Consent Risks of the procedure as well as the alternatives and risks of each were explained to the patient and/or caregiver.  Consent for the procedure was obtained and is signed in the bedside chart  Anesthesia Topical only with 1% lidocaine   Timeout Verified patient identification, verified procedure, site/side was marked, verified correct patient position, special equipment/implants available, medications/allergies/relevant history reviewed, required imaging and test results available.  Sterile Technique Maximal sterile technique including full sterile barrier drape, hand hygiene, sterile gown, sterile gloves, mask, hair covering, sterile ultrasound probe cover (if used).  Procedure Description Area of catheter insertion was cleaned with chlorhexidine and draped in sterile fashion.  With real-time ultrasound guidance a central venous catheter was placed into the left internal jugular vein. Nonpulsatile blood flow and easy flushing noted in all ports.  The catheter was sutured in place and sterile dressing applied.  Complications/Tolerance None; patient tolerated the procedure well. Chest X-ray is ordered to verify placement for internal jugular or subclavian cannulation.   Chest x-ray is not ordered for femoral cannulation.  EBL Minimal  Specimen(s) None  Elige Radon, MD Internal Medicine Resident PGY2 February 24, 2020 3:34 PM

## 2020-02-18 NOTE — Progress Notes (Signed)
Notified bedside nurse of need to draw repeat lactic acid and pt needs fluid bolus (received 2 liters per EMS) but needs total 5553 cc per sepsis protocol.

## 2020-02-18 NOTE — ED Notes (Signed)
Placed 16 fr temp foley patient

## 2020-02-18 NOTE — Progress Notes (Signed)
   2020-02-25 1140  Clinical Encounter Type  Visited With Health care provider;Family  Visit Type ED;Code  Referral From Nurse  Consult/Referral To Chaplain   Chaplain responded to code cool. Family present in ED consult A. Family notified that pt is being prepped for transport to [facility] ICU and then family would be allowed to see pt. Chaplain provided wife and one other family member ginger ale upon request. Chaplains available for support as needs arise.   Chaplain Resident, Amado Coe, M Div [phone] on-call pager

## 2020-02-18 NOTE — ED Notes (Addendum)
Phlebotomy made aware of code sepsis, pt is a very hard stick, delay in hanging antibiotics due to this, will start antibiotics after first blood culture drawn

## 2020-02-18 NOTE — ED Notes (Signed)
First set of blood cultures drawn at 1141, phlebotomy working on second set now

## 2020-02-18 NOTE — Progress Notes (Signed)
  Echocardiogram 2D Echocardiogram has been performed.  Maurice Nguyen 02/22/2020, 5:54 PM

## 2020-02-18 NOTE — Interval H&P Note (Signed)
History and Physical Interval Note:  01/25/2020 1:36 PM  Maurice Nguyen  has presented today for surgery, with the diagnosis of cardiac arrest.  The various methods of treatment have been discussed with the patient and family. After consideration of risks, benefits and other options for treatment, the patient has consented to  Procedure(s): TEMPORARY PACEMAKER (N/A) as a surgical intervention.  The patient's history has been reviewed, patient examined, no change in status, stable for surgery.  I have reviewed the patient's chart and labs.  Questions were answered to the patient's satisfaction.     Theron Arista [facility] 02/07/2020 1:36 PM

## 2020-02-18 NOTE — ED Triage Notes (Signed)
Pt from home with ems with cardiac arrest. Wife last saw pt at 730 this morning, pt went back to bed and then wife found pt unresponsive in bed. Fire arrived pt was agonal but had pulses. Pt then went pulseless, CPR started at 0837 by fire. EMS arrived, pt asystole on the monitor.  King airway and IO placed.  Meds given:  8 epi total 2G Mag 450 Amio total 1 bicarb  2L saline bolus  During code pt went into v fib, shocked 4 times total.  ROSC achieved at 0921.

## 2020-02-18 NOTE — Progress Notes (Signed)
ANTICOAGULATION CONSULT NOTE  Pharmacy Consult for heparin Indication: atrial fibrillation  Heparin Dosing Weight: 127.5 kg  Labs: Recent Labs    02/10/2020 1014 02/04/2020 1014 02/20/2020 1022 01/24/2020 1022 02/02/2020 1040 02/05/2020 1126 02/04/2020 1126 01/30/2020 1313 01/25/2020 1627 01/28/2020 1741  HGB 14.5   < > 16.3   < >  --  15.3   < > 15.3  --  14.4  HCT 46.8   < > 48.0   < >  --  45.0  --  45.0  --  44.0  PLT 221  --   --   --   --   --   --   --   --  214  APTT  --   --   --   --   --   --   --   --  33  --   LABPROT  --   --   --   --  17.0*  --   --   --   --   --   INR  --   --   --   --  1.4*  --   --   --   --   --   HEPARINUNFRC  --   --   --   --   --   --   --   --  <0.10*  --   CREATININE 1.61*  --  1.30*  --   --   --   --   --  2.08*  --    < > = values in this interval not displayed.    Assessment: 54 yom on Xarelto PTA for afib presenting s/p CPR with ROSC, intubated and code cool initiated. Pharmacy consulted to dose heparin for afib. CBC wnl, SCr 1.3. No active bleed issues reported. Patient previously had heparin SQ for VTE prophylaxis ordered this admit - no doses given.  Significant other states she does not know exactly which medications Mr. Zeoli takes, but he took meds at 0730 on 7/26. However, Xarelto not filled since 30-day supply in April 2021.  Baseline hep lvl < 0.1 indicating non compliance to xarelto  Goal of Therapy:  Heparin level 0.3-0.7 units/ml aPTT 66-102 seconds Monitor platelets by anticoagulation protocol: Yes   Plan:  Heparin 1050 units/hr Initial hep lvl 0030  Elmer Sow, PharmD, BCPS, BCCCP Clinical Pharmacist [phone]  Please check AMION for all [facility] Pharmacy numbers  01/27/2020 6:32 PM

## 2020-02-18 NOTE — Progress Notes (Signed)
Pharmacy Antibiotic Note  Maurice Nguyen is a 54 y.o. male admitted on 01/26/2020 with sepsis.  Pharmacy has been consulted for vancomycin/cefepime dosing. Low temp, WBC 26.4. SCr 1.3 on admit (baseline ~1.0-1.3), normalized CrCl ~65.  Plan: Cefepime 2g IV x 1; then 2g IV q8h Vancomycin 2500mg  IV x1; then Vancomycin 1250 mg IV Q 12 hrs Flagyl 500mg  IV x 1 per EDP - f/u if to continue Monitor clinical progress, c/s, renal function F/u de-escalation plan/LOT, vancomycin levels as indicated   Height: 6\' 2"  (188 cm) Weight: (!) 185.1 kg (408 lb 1.1 oz) IBW/kg (Calculated) : 82.2  Temp (24hrs), Avg:83.4 F (28.6 C), Min:83.4 F (28.6 C), Max:83.4 F (28.6 C)  Recent Labs  Lab 02/10/2020 1014 02/19/2020 1022  WBC 26.4*  --   CREATININE  --  1.30*    Estimated Creatinine Clearance: 110.7 mL/min (A) (by C-G formula based on SCr of 1.3 mg/dL (H)).    No Known Allergies  Antimicrobials this admission: 7/26 vancomycin >>  7/26 cefepime >>  7/26 flagyl x 1  Dose adjustments this admission:   Microbiology results:   8/26, PharmD, BCPS Please check AMION for all [facility] Pharmacy contact numbers Clinical Pharmacist 02/02/2020 11:17 AM

## 2020-02-18 NOTE — Progress Notes (Signed)
vLTM started  Neurology notified  Cooling protocol  Event button tested

## 2020-02-18 NOTE — Progress Notes (Signed)
Notified bedside nurse of need to administer antibiotics (vancomycin) and get repeat lactate.

## 2020-02-18 NOTE — Progress Notes (Signed)
RT NOTE: Pt transported from ER to 2H 25. No complications noted.

## 2020-02-18 NOTE — Consult Note (Addendum)
Cardiology Consultation:   Patient ID: Maurice Nguyen MRN: [id number]; DOB: Dec 24, 1965  Admit date: 02/11/2020 Date of Consult: 02/12/2020  Primary Care Provider: Tracey Harries, MD [facility] Cardiologist: Kristeen Miss, MD  [facility] Electrophysiologist:  None    Patient Profile:   Maurice Nguyen is a 54 y.o. male with a hx of combined systolic and diastolic congestive heart failure, atrial fibrillation, morbid obesity who is being seen today for the evaluation of  S/p cardiac arrest  at the request of  Dr. . Craige Cotta. Marland Kitchen  History of Present Illness:   Maurice Nguyen is a 54 year old gentleman who have seen in the past.  He was last seen in our office in March, 2019.  He had an appointment this year but did not make it to the appointment.  History was provided by wife and other healthcare providers.  Patient is unable to give any history. The wife noted that when she woke up at 7:00 this morning her husband was okay.  He got up and had some breakfast and went back to sleep.  She woke back up at 730 and noticed agonal breathing and he was unresponsive.  EMS was called.  They noted that he had a pulse at that time but then he developed asystole.  He required 8 rounds of CPR.  He transitioned into V. fib arrest and responded to 1 shock.  Upon presentation to the emergency room he was noted to be hypothermic with a temperature of 83.4. Head CT scan was negative.  Currently on high-dose Levophed.  Blood pressure is 80/60.  Heart rate is 116.  He has a very high lactic acid level.     Past Medical History:  Diagnosis Date  . A-fib (HCC)   . Anticoagulated, on Xarelto, Chads2Vas score 2 10/02/2013  . Back pain, chronic 07/13/2012  . CHF (congestive heart failure) (HCC)   . 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 CKD (chronic kidney disease), stage II   . Complication of anesthesia    " THEY LOST ME AND BROUGHT ME BACK "  . Hypertension 07/13/2012  .  Irregular heart beat   . Lumbar herniated disc   . Morbid obesity (HCC)   . Obesity, morbid, BMI 50 or higher (HCC) 07/13/2012  . 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 Renal insufficiency 10/08/2019  . Shortness of breath   . Type II diabetes mellitus (HCC) 07/13/2012    Past Surgical History:  Procedure Laterality Date  . ABSCESS DRAINAGE    . APPLICATION OF A-CELL OF EXTREMITY Left 12/05/2019   Procedure: Application Of A-Cell Of Extremity;  Surgeon: Vivi Barrack, DPM;  Location: [facility] OR;  Service: Podiatry;  Laterality: Left;  . COLONOSCOPY  08/22/2012   Procedure: COLONOSCOPY;  Surgeon: Shirley Friar, MD;  Location: [facility] ENDOSCOPY;  Service: Endoscopy;  Laterality: N/A;  . CORONARY STENT PLACEMENT Left 10/11/2019   Procedure: left heart cath and coronary angiography;  Surgeon: Kathleene Hazel, MD;  Location: [facility] OR;  Service: Cardiovascular;  Laterality: Left;  . DENTAL SURGERY    . INCISION AND DRAINAGE OF WOUND Left 12/05/2019   Procedure: IRRIGATION AND DEBRIDEMENT LEFT FOOT  WOUND AND APPLICATION GRAFT;  Surgeon: Vivi Barrack, DPM;  Location: [facility] OR;  Service: Podiatry;  Laterality: Left;  . LOWER EXTREMITY ANGIOGRAM Bilateral 10/11/2019   Procedure: LOWER EXTREMITY ANGIOGRAM;  Surgeon: Maeola Harman, MD;  Location: [facility] OR;  Service: Vascular;  Laterality: Bilateral;  .  WOUND DEBRIDEMENT Left 10/10/2019   Procedure: DEBRIDEMENT WOUND;  Surgeon: Vivi Barrack, DPM;  Location: [facility] OR;  Service: Podiatry;  Laterality: Left;  . WOUND DEBRIDEMENT Left 10/15/2019   Procedure: DEBRIDEMENT WOUND, POSSIBLE WOUND VAC APPLICATION;  Surgeon: Vivi Barrack, DPM;  Location: [facility] OR;  Service: Podiatry;  Laterality: Left;     Home Medications:  Prior to Admission medications   Medication Sig Start Date End Date Taking? Authorizing Provider  atorvastatin (LIPITOR) 80 MG tablet Take 1 tablet (80 mg total) by mouth daily at 6 PM. 10/18/19  Yes Gonfa, Boyce Medici, MD    carvedilol (COREG) 12.5 MG tablet Take 1 tablet (12.5 mg total) by mouth 2 (two) times daily with a meal. 10/18/19  Yes Gonfa, Taye T, MD  furosemide (LASIX) 80 MG tablet Take 1 tablet (80 mg total) by mouth 2 (two) times daily. 10/18/19  Yes Almon Hercules, MD  gabapentin (NEURONTIN) 300 MG capsule Take 1 capsule (300 mg total) by mouth 2 (two) times daily. 10/18/19 02/17/21 Yes Almon Hercules, MD  insulin aspart protamine - aspart (NOVOLOG MIX 70/30 FLEXPEN) (70-30) 100 UNIT/ML FlexPen Inject 60 Units into the skin 2 (two) times daily. 11/14/19  Yes [provider]  nitroGLYCERIN (NITROSTAT) 0.4 MG SL tablet Place 1 tablet (0.4 mg total) under the tongue every 5 (five) minutes x 3 doses as needed for chest pain. 02/03/15  Yes [name], MD  rivaroxaban (XARELTO) 20 MG TABS tablet Take 1 tablet (20 mg total) by mouth daily with supper. 10/18/19  Yes Almon Hercules, MD  sildenafil (VIAGRA) 25 MG tablet Take 50 mg by mouth daily as needed for erectile dysfunction.  01/03/20  Yes [provider]  SYNJARDY XR 11-998 MG TB24 Take 1 tablet by mouth 2 (two) times daily with a meal.  11/17/19  Yes [provider]  Accu-Chek FastClix Lancets MISC 1 each by Other route 3 (three) times daily. 11/14/19   [provider]  amoxicillin-clavulanate (AUGMENTIN) 875-125 MG tablet Take 1 tablet by mouth 2 (two) times daily. Patient not taking: Reported on 02/15/2020 10/30/19   Vivi Barrack, DPM  ampicillin (PRINCIPEN) 500 MG capsule Take 1 capsule (500 mg total) by mouth 4 (four) times daily. Patient not taking: Reported on 02/04/2020 11/15/19   Vivi Barrack, DPM  benzonatate (TESSALON) 200 MG capsule Take 1 capsule (200 mg total) by mouth 3 (three) times daily as needed for cough. Patient not taking: Reported on 02/11/2020 10/18/19   Almon Hercules, MD  empagliflozin (JARDIANCE) 10 MG TABS tablet Take 10 mg by mouth daily before breakfast. Patient not taking: Reported on 02/22/2020  10/18/19   Almon Hercules, MD  Ensure Max Protein (ENSURE MAX PROTEIN) LIQD Take 330 mLs (11 oz total) by mouth 2 (two) times daily. 10/18/19   Almon Hercules, MD  glucose blood (ACCU-CHEK GUIDE) test strip 1 each by Other route 3 (three) times daily before meals. 11/14/19 11/13/20  [provider]  HYDROcodone-acetaminophen (NORCO/VICODIN) 5-325 MG tablet Take 1 tablet by mouth every 6 (six) hours as needed. Patient not taking: Reported on 02/15/2020 12/05/19   Vivi Barrack, DPM  insulin aspart (NOVOLOG) 100 UNIT/ML injection Inject 0-20 Units into the skin 3 (three) times daily with meals. CBG 70 - 120: 0 units CBG 121 - 150: 3 units CBG 151 - 200: 4 units CBG 201 - 250: 7 units CBG 251 - 300: 11 units CBG 301 - 350: 15 units  CBG 351 - 400: 20 units CBG > 400: call MD and obtain STAT lab verification Patient not taking: Reported on Feb 19, 2020 10/18/19   Almon Hercules, MD  insulin NPH Human (NOVOLIN N) 100 UNIT/ML injection Inject 0.35 mLs (35 Units total) into the skin 2 (two) times daily at 8 am and 10 pm. Patient not taking: Reported on 2020-02-19 10/18/19   Almon Hercules, MD  metFORMIN (GLUCOPHAGE-XR) 500 MG 24 hr tablet Take 2 tablets (1,000 mg total) by mouth in the morning and at bedtime. Patient not taking: Reported on 02/19/20 10/18/19   Almon Hercules, MD  Multiple Vitamin (MULTIVITAMIN WITH MINERALS) TABS tablet Take 1 tablet by mouth daily. 10/18/19   Almon Hercules, MD  oxyCODONE-acetaminophen (PERCOCET/ROXICET) 5-325 MG tablet Take 1 tablet by mouth every 8 (eight) hours as needed. 12/03/19   [provider]  polyethylene glycol (MIRALAX / GLYCOLAX) 17 g packet Take 17 g by mouth 2 (two) times daily as needed for mild constipation. 10/18/19   Almon Hercules, MD  saccharomyces boulardii (FLORASTOR) 250 MG capsule Take 1 capsule (250 mg total) by mouth 2 (two) times daily. 10/18/19   Almon Hercules, MD  sacubitril-valsartan (ENTRESTO) 24-26 MG Take 1 tablet by mouth 2 (two)  times daily. 10/18/19   Almon Hercules, MD  Vitamin D, Ergocalciferol, (DRISDOL) 1.25 MG (50000 UNIT) CAPS capsule Take 1 capsule (50,000 Units total) by mouth every 7 (seven) days. 10/23/19   Almon Hercules, MD    Inpatient Medications: Scheduled Meds: . docusate  100 mg Oral BID  . insulin aspart  0-6 Units Subcutaneous Q4H  . pantoprazole sodium  40 mg Per Tube Daily  . polyethylene glycol  17 g Oral Daily   Continuous Infusions: . sodium chloride    . sodium chloride    . ceFEPime (MAXIPIME) IV    . norepinephrine (LEVOPHED) Adult infusion    . vancomycin    . [START ON 02/19/2020] vancomycin     PRN Meds: docusate sodium, polyethylene glycol  Allergies:   No Known Allergies  Social History:   Social History   Socioeconomic History  . Marital status: Single    Spouse name: Not on file  . Number of children: Not on file  . Years of education: Not on file  . Highest education level: Not on file  Occupational History  . Occupation: N/A  Tobacco Use  . Smoking status: Never Smoker  . Smokeless tobacco: Never Used  Vaping Use  . Vaping Use: Never used  Substance and Sexual Activity  . Alcohol use: No    Comment: rarely  . Drug use: No  . Sexual activity: Yes  Other Topics Concern  . Not on file  Social History Narrative   Denies caffeine use    Social Determinants of Health   Financial Resource Strain:   . Difficulty of Paying Living Expenses:   Food Insecurity:   . Worried About Programme researcher, broadcasting/film/video in the Last Year:   . Barista in the Last Year:   Transportation Needs:   . Freight forwarder (Medical):   Marland Kitchen Lack of Transportation (Non-Medical):   Physical Activity:   . Days of Exercise per Week:   . Minutes of Exercise per Session:   Stress:   . Feeling of Stress :   Social Connections:   . Frequency of Communication with Friends and Family:   . Frequency of Social Gatherings with Friends and Family:   .  Attends Religious Services:   . Active  Member of Clubs or Organizations:   . Attends Banker Meetings:   Marland Kitchen Marital Status:   Intimate Partner Violence:   . Fear of Current or Ex-Partner:   . Emotionally Abused:   Marland Kitchen Physically Abused:   . Sexually Abused:     Family History:    Family History  Problem Relation Age of Onset  . Heart disease Neg Hx      ROS:  Please see the history of present illness.   All other ROS reviewed and negative.     Physical Exam/Data:   Vitals:   2020/02/19 1121 2020/02/19 1127 02-19-2020 1130 02/19/20 1229  BP: (!) 92/53 (!) 98/33 (!) 111/47   Pulse:    83  Resp:    17  Temp: 97.8 F (36.6 C)     TempSrc:      SpO2:      Weight:      Height:       No intake or output data in the 24 hours ending February 19, 2020 1233 Last 3 Weights 2020/02/19 12/05/2019 10/30/2019  Weight (lbs) 408 lb 1.1 oz 408 lb 402 lb 8 oz  Weight (kg) 185.1 kg 185.068 kg 182.573 kg     Body mass index is 52.39 kg/m.  General: Morbidly obese gentleman, currently intubated, unresponsive HEENT: normal Lymph: no adenopathy Neck: no JVD Endocrine:  No thryomegaly Vascular: No carotid bruits; FA pulses 2+ bilaterally without bruits  Cardiac:  normal S1, S2; regular rate, distant heart sounds Lungs: Currently on the ventilator. Abd: Morbidly obese. Ext: no edema.  Very weak pulses. Musculoskeletal: Unable to assess strength. Skin: He is extremely cool. Neuro: Sedated, unresponsive Psych: Unresponsive  EKG:  The EKG was personally reviewed and demonstrates: Atrial flutter with variable block. Telemetry:  Telemetry was personally reviewed and demonstrates: Atrial flutter with 2-1 block.  Relevant CV Studies:   Laboratory Data:  High Sensitivity Troponin:   Recent Labs  Lab 02/19/2020 1014  TROPONINIHS 169*     Chemistry Recent Labs  Lab 19-Feb-2020 1014 Feb 19, 2020 1022 02/19/20 1126  NA 136 138 136  K 5.5* 5.4* 4.3  CL 101 101  --   CO2 17*  --   --   GLUCOSE 306* 312*  --   BUN 25* 41*  --     CREATININE 1.61* 1.30*  --   CALCIUM 8.2*  --   --   GFRNONAA 47*  --   --   GFRAA 55*  --   --   ANIONGAP 18*  --   --     Recent Labs  Lab 02-19-20 1014  PROT 6.1*  ALBUMIN 2.9*  AST 785*  ALT 641*  ALKPHOS 124  BILITOT 1.7*   Hematology Recent Labs  Lab 02-19-2020 1014 19-Feb-2020 1022 02-19-2020 1126  WBC 26.4*  --   --   RBC 4.51  --   --   HGB 14.5 16.3 15.3  HCT 46.8 48.0 45.0  MCV 103.8*  --   --   MCH 32.2  --   --   MCHC 31.0  --   --   RDW 14.0  --   --   PLT 221  --   --    BNP Recent Labs  Lab Feb 19, 2020 1030  BNP 158.1*    DDimer No results for input(s): DDIMER in the last 168 hours.   Radiology/Studies:  CT Head Wo Contrast  Result Date: 2020/02/19 CLINICAL DATA:  Cardiac arrest with altered mental status EXAM: CT HEAD WITHOUT CONTRAST TECHNIQUE: Contiguous axial images were obtained from the base of the skull through the vertex without intravenous contrast. COMPARISON:  Dec 11, 2019 FINDINGS: Brain: Ventricles are normal in size and configuration. There is mild frontal and parietal lobe atrophy, stable. There is no intracranial mass, hemorrhage, extra-axial fluid collection, or midline shift. There is evidence of a prior infarct in the inferior most aspect of the left occipital lobe, stable. There is slight small vessel disease in the centra semiovale bilaterally. No acute infarct is appreciable. Vascular: No hyperdense vessel. There are foci of calcification in each carotid siphon region. Skull: The bony calvarium appears intact. Sinuses/Orbits: Patient is intubated. There is mucosal thickening in multiple ethmoid air cells. There is mucosal thickening in the anterior sphenoid sinus. There is opacification in posterior nares regions, likely a function of the intubation. Orbits appear symmetric bilaterally. Other: Mastoid air cells are clear. IMPRESSION: Mild frontal and parietal atrophy, stable. Prior infarct inferior left occipital lobe, stable. Mild  periventricular small vessel disease, likewise stable. No acute infarct demonstrable. No mass or hemorrhage. Foci of arterial vascular calcification noted. Foci of paranasal sinus disease noted. Patient intubated. Electronically Signed   By: Bretta Bang III M.D.   On: 2020-02-24 11:15   DG CHEST PORT 1 VIEW  Result Date: 02-24-20 CLINICAL DATA:  ETT advancement EXAM: PORTABLE CHEST 1 VIEW COMPARISON:  24-Feb-2020 at 1022 hours FINDINGS: Interval advancement of endotracheal tube which now terminates 2.5 cm above the carina. Enteric tube courses below the diaphragm with distal tip beyond the inferior margin of the film. Low lung volumes. Stable cardiomegaly. Pulmonary vascular congestion without overt edema. No lobar consolidation. Left costophrenic angle was excluded from the field of view. No pneumothorax. IMPRESSION: 1. Interval advancement of endotracheal tube which now terminates 2.5 cm above the carina. 2. Otherwise, stable chest. Electronically Signed   By: Duanne Guess D.O.   On: 2020/02/24 12:15   DG Chest Portable 1 View  Result Date: 2020/02/24 CLINICAL DATA:  Hypoxia EXAM: PORTABLE CHEST 1 VIEW COMPARISON:  October 08, 2019 FINDINGS: Endotracheal tube tip is at the T1 level, 8.2 cm above the carina. Nasogastric tube tip and side port in stomach. No pneumothorax. There is cardiomegaly with pulmonary venous hypertension. There is no evident edema or airspace opacity. No adenopathy appreciable by radiography. IMPRESSION: Tube positions as described without pneumothorax. It may be prudent to consider advancing endotracheal tube 4-5 cm. There is cardiomegaly with pulmonary vascular congestion. No edema or airspace opacity. Electronically Signed   By: Bretta Bang III M.D.   On: Feb 24, 2020 10:40    Assessment and Plan:   1. Cardiac arrest: The patient presents several hours after cardiac arrest.  He was last noted to be well around 730.  His wife went into the bedroom and heard agonal  respirations.  He was unresponsive.  EMS was called.  He was found to have asystole initially.  He received 8 rounds of epinephrine and CPR.  Eventually he was found to have ventricular fibrillation and was cardioverted.  He now is in atrial flutter.  At this time we do not have any explanation.  He is in cardiogenic shock and requiring high doses of Levophed.  There is a hint of ST segment elevation in the chest leads.  I think that we should proceed to the Cath Lab for diagnostic heart catheterization.  He may also need an assist device.  2.  Acute on  chronic combined systolic and diastolic congestive heart failure: Continue aggressive approach for now.   3.  Bradycardia:  Pt developed severe bradycardia . Is now being transcutaneously paced.  No underlying rhythm when pacer is off     For questions or updates, please contact [facility] Please consult [email or web address] for contact info under    Signed, Kristeen Miss, MD  2020-03-14 12:33 PM     Addendum:   We were able to find a cath note from October 11, 2019 Dr. Clifton James performed a cath during a PV procedure with Dr. Randie Heinz. Under Notes tab. He has no obstructive CAD     Kristeen Miss, MD  Mar 14, 2020 1:25 PM    [facility] [address]  [address] [place], [place]  [address] Phone: [phone]; Fax: [phone]

## 2020-02-18 NOTE — Progress Notes (Signed)
EEG complete - results pending 

## 2020-02-18 NOTE — Progress Notes (Signed)
Wasted: 140cc fentanyl Location: stericycle in soiled utility B 2H Witness: Elayne Guerin, RN

## 2020-02-18 NOTE — Progress Notes (Signed)
Notified bedside nurse of need to administer fluid bolus, pt needs 5553 cc fluid per protocol.

## 2020-02-18 NOTE — Code Documentation (Signed)
Faint pulses present  

## 2020-02-18 NOTE — Progress Notes (Signed)
RT note-PEEP+8

## 2020-02-18 NOTE — Code Documentation (Signed)
Difficulty gaining IV access, EDP attempting US guided IV.

## 2020-02-18 NOTE — Procedures (Signed)
Patient Name: Maurice Nguyen  MRN: [id number]  Epilepsy Attending: Charlsie Quest  Referring Physician/Provider: Dr Peter [place] Date: 02/03/2020 Duration: 21.02 mins  Patient history: 54yo M s/p cardiac arrest on TTM. EEG to evaluate for seizure.   Level of alertness:  comatose  AEDs during EEG study: None  Technical aspects: This EEG study was done with scalp electrodes positioned according to the 10-20 International system of electrode placement. Electrical activity was acquired at a sampling rate of 500Hz  and reviewed with a high frequency filter of 70Hz  and a low frequency filter of 1Hz . EEG data were recorded continuously and digitally stored.   Description:  EEG showed continuous generalized suppression. EEG was not reactive to tactile stimuli. In the beginning of recording, burst of generalized polyspikes were also noted. Hyperventilation and photic stimulation were not performed.     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 

## 2020-02-18 NOTE — H&P (View-Only) (Signed)
Cardiology Consultation:   Patient ID: Anthany A Kosanke MRN: [id number]; DOB: 09/28/1965  Admit date: 02/16/2020 Date of Consult: 01/25/2020  Primary Care Provider: Bouska, David, MD [facility] Cardiologist: Jacarius Handel, MD  [facility] Electrophysiologist:  None    Patient Profile:   Keith A Stipe is a 54 y.o. male with a hx of combined systolic and diastolic congestive heart failure, atrial fibrillation, morbid obesity who is being seen today for the evaluation of  S/p cardiac arrest  at the request of  Dr. . Sood. .  History of Present Illness:   Mr. Waldrip is a 54-year-old gentleman who have seen in the past.  He was last seen in our office in March, 2019.  He had an appointment this year but did not make it to the appointment.  History was provided by wife and other healthcare providers.  Patient is unable to give any history. The wife noted that when she woke up at 7:00 this morning her husband was okay.  He got up and had some breakfast and went back to sleep.  She woke back up at 730 and noticed agonal breathing and he was unresponsive.  EMS was called.  They noted that he had a pulse at that time but then he developed asystole.  He required 8 rounds of CPR.  He transitioned into V. fib arrest and responded to 1 shock.  Upon presentation to the emergency room he was noted to be hypothermic with a temperature of 83.4. Head CT scan was negative.  Currently on high-dose Levophed.  Blood pressure is 80/60.  Heart rate is 116.  He has a very high lactic acid level.     Past Medical History:  Diagnosis Date  . A-fib (HCC)   . Anticoagulated, on Xarelto, Chads2Vas score 2 10/02/2013  . Back pain, chronic 07/13/2012  . CHF (congestive heart failure) (HCC)   . 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 CKD (chronic kidney disease), stage II   . Complication of anesthesia    " THEY LOST ME AND BROUGHT ME BACK "  . Hypertension 07/13/2012  .  Irregular heart beat   . Lumbar herniated disc   . Morbid obesity (HCC)   . Obesity, morbid, BMI 50 or higher (HCC) 07/13/2012  . 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 Renal insufficiency 10/08/2019  . Shortness of breath   . Type II diabetes mellitus (HCC) 07/13/2012    Past Surgical History:  Procedure Laterality Date  . ABSCESS DRAINAGE    . APPLICATION OF A-CELL OF EXTREMITY Left 12/05/2019   Procedure: Application Of A-Cell Of Extremity;  Surgeon: Wagoner, Matthew R, DPM;  Location: [facility];  Service: Podiatry;  Laterality: Left;  . COLONOSCOPY  08/22/2012   Procedure: COLONOSCOPY;  Surgeon: Vincent C. Schooler, MD;  Location: [facility];  Service: Endoscopy;  Laterality: N/A;  . CORONARY STENT PLACEMENT Left 10/11/2019   Procedure: left heart cath and coronary angiography;  Surgeon: McAlhany, Christopher D, MD;  Location: [facility] OR;  Service: Cardiovascular;  Laterality: Left;  . DENTAL SURGERY    . INCISION AND DRAINAGE OF WOUND Left 12/05/2019   Procedure: IRRIGATION AND DEBRIDEMENT LEFT FOOT  WOUND AND APPLICATION GRAFT;  Surgeon: Wagoner, Matthew R, DPM;  Location: [facility] OR;  Service: Podiatry;  Laterality: Left;  . LOWER EXTREMITY ANGIOGRAM Bilateral 10/11/2019   Procedure: LOWER EXTREMITY ANGIOGRAM;  Surgeon: Cain, Brandon Christopher, MD;  Location: [facility] OR;  Service: Vascular;  Laterality: Bilateral;  .   WOUND DEBRIDEMENT Left 10/10/2019   Procedure: DEBRIDEMENT WOUND;  Surgeon: Wagoner, Matthew R, DPM;  Location: [facility];  Service: Podiatry;  Laterality: Left;  . WOUND DEBRIDEMENT Left 10/15/2019   Procedure: DEBRIDEMENT WOUND, POSSIBLE WOUND VAC APPLICATION;  Surgeon: Wagoner, Matthew R, DPM;  Location: [facility];  Service: Podiatry;  Laterality: Left;     Home Medications:  Prior to Admission medications   Medication Sig Start Date End Date Taking? Authorizing Provider  atorvastatin (LIPITOR) 80 MG tablet Take 1 tablet (80 mg total) by mouth daily at 6 PM. 10/18/19  Yes Gonfa, Taye T, MD    carvedilol (COREG) 12.5 MG tablet Take 1 tablet (12.5 mg total) by mouth 2 (two) times daily with a meal. 10/18/19  Yes Gonfa, Taye T, MD  furosemide (LASIX) 80 MG tablet Take 1 tablet (80 mg total) by mouth 2 (two) times daily. 10/18/19  Yes Gonfa, Taye T, MD  gabapentin (NEURONTIN) 300 MG capsule Take 1 capsule (300 mg total) by mouth 2 (two) times daily. 10/18/19 02/17/21 Yes Gonfa, Taye T, MD  insulin aspart protamine - aspart (NOVOLOG MIX 70/30 FLEXPEN) (70-30) 100 UNIT/ML FlexPen Inject 60 Units into the skin 2 (two) times daily. 11/14/19  Yes [provider]  nitroGLYCERIN (NITROSTAT) 0.4 MG SL tablet Place 1 tablet (0.4 mg total) under the tongue every 5 (five) minutes x 3 doses as needed for chest pain. 02/03/15  Yes Zamora, Ezequiel, MD  rivaroxaban (XARELTO) 20 MG TABS tablet Take 1 tablet (20 mg total) by mouth daily with supper. 10/18/19  Yes Gonfa, Taye T, MD  sildenafil (VIAGRA) 25 MG tablet Take 50 mg by mouth daily as needed for erectile dysfunction.  01/03/20  Yes [provider]  SYNJARDY XR 11-998 MG TB24 Take 1 tablet by mouth 2 (two) times daily with a meal.  11/17/19  Yes [provider]  Accu-Chek FastClix Lancets MISC 1 each by Other route 3 (three) times daily. 11/14/19   [provider]  amoxicillin-clavulanate (AUGMENTIN) 875-125 MG tablet Take 1 tablet by mouth 2 (two) times daily. Patient not taking: Reported on 01/24/2020 10/30/19   Wagoner, Matthew R, DPM  ampicillin (PRINCIPEN) 500 MG capsule Take 1 capsule (500 mg total) by mouth 4 (four) times daily. Patient not taking: Reported on 02/13/2020 11/15/19   Wagoner, Matthew R, DPM  benzonatate (TESSALON) 200 MG capsule Take 1 capsule (200 mg total) by mouth 3 (three) times daily as needed for cough. Patient not taking: Reported on 02/12/2020 10/18/19   Gonfa, Taye T, MD  empagliflozin (JARDIANCE) 10 MG TABS tablet Take 10 mg by mouth daily before breakfast. Patient not taking: Reported on 02/05/2020  10/18/19   Gonfa, Taye T, MD  Ensure Max Protein (ENSURE MAX PROTEIN) LIQD Take 330 mLs (11 oz total) by mouth 2 (two) times daily. 10/18/19   Gonfa, Taye T, MD  glucose blood (ACCU-CHEK GUIDE) test strip 1 each by Other route 3 (three) times daily before meals. 11/14/19 11/13/20  [provider]  HYDROcodone-acetaminophen (NORCO/VICODIN) 5-325 MG tablet Take 1 tablet by mouth every 6 (six) hours as needed. Patient not taking: Reported on 02/07/2020 12/05/19   Wagoner, Matthew R, DPM  insulin aspart (NOVOLOG) 100 UNIT/ML injection Inject 0-20 Units into the skin 3 (three) times daily with meals. CBG 70 - 120: 0 units CBG 121 - 150: 3 units CBG 151 - 200: 4 units CBG 201 - 250: 7 units CBG 251 - 300: 11 units CBG 301 - 350: 15 units   CBG 351 - 400: 20 units CBG > 400: call MD and obtain STAT lab verification Patient not taking: Reported on 02/15/2020 10/18/19   Gonfa, Taye T, MD  insulin NPH Human (NOVOLIN N) 100 UNIT/ML injection Inject 0.35 mLs (35 Units total) into the skin 2 (two) times daily at 8 am and 10 pm. Patient not taking: Reported on 02/11/2020 10/18/19   Gonfa, Taye T, MD  metFORMIN (GLUCOPHAGE-XR) 500 MG 24 hr tablet Take 2 tablets (1,000 mg total) by mouth in the morning and at bedtime. Patient not taking: Reported on 02/09/2020 10/18/19   Gonfa, Taye T, MD  Multiple Vitamin (MULTIVITAMIN WITH MINERALS) TABS tablet Take 1 tablet by mouth daily. 10/18/19   Gonfa, Taye T, MD  oxyCODONE-acetaminophen (PERCOCET/ROXICET) 5-325 MG tablet Take 1 tablet by mouth every 8 (eight) hours as needed. 12/03/19   [provider]  polyethylene glycol (MIRALAX / GLYCOLAX) 17 g packet Take 17 g by mouth 2 (two) times daily as needed for mild constipation. 10/18/19   Gonfa, Taye T, MD  saccharomyces boulardii (FLORASTOR) 250 MG capsule Take 1 capsule (250 mg total) by mouth 2 (two) times daily. 10/18/19   Gonfa, Taye T, MD  sacubitril-valsartan (ENTRESTO) 24-26 MG Take 1 tablet by mouth 2 (two)  times daily. 10/18/19   Gonfa, Taye T, MD  Vitamin D, Ergocalciferol, (DRISDOL) 1.25 MG (50000 UNIT) CAPS capsule Take 1 capsule (50,000 Units total) by mouth every 7 (seven) days. 10/23/19   Gonfa, Taye T, MD    Inpatient Medications: Scheduled Meds: . docusate  100 mg Oral BID  . insulin aspart  0-6 Units Subcutaneous Q4H  . pantoprazole sodium  40 mg Per Tube Daily  . polyethylene glycol  17 g Oral Daily   Continuous Infusions: . sodium chloride    . sodium chloride    . ceFEPime (MAXIPIME) IV    . norepinephrine (LEVOPHED) Adult infusion    . vancomycin    . [START ON 02/19/2020] vancomycin     PRN Meds: docusate sodium, polyethylene glycol  Allergies:   No Known Allergies  Social History:   Social History   Socioeconomic History  . Marital status: Single    Spouse name: Not on file  . Number of children: Not on file  . Years of education: Not on file  . Highest education level: Not on file  Occupational History  . Occupation: N/A  Tobacco Use  . Smoking status: Never Smoker  . Smokeless tobacco: Never Used  Vaping Use  . Vaping Use: Never used  Substance and Sexual Activity  . Alcohol use: No    Comment: rarely  . Drug use: No  . Sexual activity: Yes  Other Topics Concern  . Not on file  Social History Narrative   Denies caffeine use    Social Determinants of Health   Financial Resource Strain:   . Difficulty of Paying Living Expenses:   Food Insecurity:   . Worried About Running Out of Food in the Last Year:   . Ran Out of Food in the Last Year:   Transportation Needs:   . Lack of Transportation (Medical):   . Lack of Transportation (Non-Medical):   Physical Activity:   . Days of Exercise per Week:   . Minutes of Exercise per Session:   Stress:   . Feeling of Stress :   Social Connections:   . Frequency of Communication with Friends and Family:   . Frequency of Social Gatherings with Friends and Family:   .   Attends Religious Services:   . Active  Member of Clubs or Organizations:   . Attends Club or Organization Meetings:   . Marital Status:   Intimate Partner Violence:   . Fear of Current or Ex-Partner:   . Emotionally Abused:   . Physically Abused:   . Sexually Abused:     Family History:    Family History  Problem Relation Age of Onset  . Heart disease Neg Hx      ROS:  Please see the history of present illness.   All other ROS reviewed and negative.     Physical Exam/Data:   Vitals:   01/31/2020 1121 02/17/2020 1127 02/21/2020 1130 02/03/2020 1229  BP: (!) 92/53 (!) 98/33 (!) 111/47   Pulse:    83  Resp:    17  Temp: 97.8 F (36.6 C)     TempSrc:      SpO2:      Weight:      Height:       No intake or output data in the 24 hours ending 02/08/2020 1233 Last 3 Weights 02/17/2020 12/05/2019 10/30/2019  Weight (lbs) 408 lb 1.1 oz 408 lb 402 lb 8 oz  Weight (kg) 185.1 kg 185.068 kg 182.573 kg     Body mass index is 52.39 kg/m.  General: Morbidly obese gentleman, currently intubated, unresponsive HEENT: normal Lymph: no adenopathy Neck: no JVD Endocrine:  No thryomegaly Vascular: No carotid bruits; FA pulses 2+ bilaterally without bruits  Cardiac:  normal S1, S2; regular rate, distant heart sounds Lungs: Currently on the ventilator. Abd: Morbidly obese. Ext: no edema.  Very weak pulses. Musculoskeletal: Unable to assess strength. Skin: He is extremely cool. Neuro: Sedated, unresponsive Psych: Unresponsive  EKG:  The EKG was personally reviewed and demonstrates: Atrial flutter with variable block. Telemetry:  Telemetry was personally reviewed and demonstrates: Atrial flutter with 2-1 block.  Relevant CV Studies:   Laboratory Data:  High Sensitivity Troponin:   Recent Labs  Lab 01/25/2020 1014  TROPONINIHS 169*     Chemistry Recent Labs  Lab 02/04/2020 1014 02/19/2020 1022 02/17/2020 1126  NA 136 138 136  K 5.5* 5.4* 4.3  CL 101 101  --   CO2 17*  --   --   GLUCOSE 306* 312*  --   BUN 25* 41*  --     CREATININE 1.61* 1.30*  --   CALCIUM 8.2*  --   --   GFRNONAA 47*  --   --   GFRAA 55*  --   --   ANIONGAP 18*  --   --     Recent Labs  Lab 01/26/2020 1014  PROT 6.1*  ALBUMIN 2.9*  AST 785*  ALT 641*  ALKPHOS 124  BILITOT 1.7*   Hematology Recent Labs  Lab 02/02/2020 1014 02/07/2020 1022 02/22/2020 1126  WBC 26.4*  --   --   RBC 4.51  --   --   HGB 14.5 16.3 15.3  HCT 46.8 48.0 45.0  MCV 103.8*  --   --   MCH 32.2  --   --   MCHC 31.0  --   --   RDW 14.0  --   --   PLT 221  --   --    BNP Recent Labs  Lab 02/07/2020 1030  BNP 158.1*    DDimer No results for input(s): DDIMER in the last 168 hours.   Radiology/Studies:  CT Head Wo Contrast  Result Date: 02/17/2020 CLINICAL DATA:    Cardiac arrest with altered mental status EXAM: CT HEAD WITHOUT CONTRAST TECHNIQUE: Contiguous axial images were obtained from the base of the skull through the vertex without intravenous contrast. COMPARISON:  Dec 11, 2019 FINDINGS: Brain: Ventricles are normal in size and configuration. There is mild frontal and parietal lobe atrophy, stable. There is no intracranial mass, hemorrhage, extra-axial fluid collection, or midline shift. There is evidence of a prior infarct in the inferior most aspect of the left occipital lobe, stable. There is slight small vessel disease in the centra semiovale bilaterally. No acute infarct is appreciable. Vascular: No hyperdense vessel. There are foci of calcification in each carotid siphon region. Skull: The bony calvarium appears intact. Sinuses/Orbits: Patient is intubated. There is mucosal thickening in multiple ethmoid air cells. There is mucosal thickening in the anterior sphenoid sinus. There is opacification in posterior nares regions, likely a function of the intubation. Orbits appear symmetric bilaterally. Other: Mastoid air cells are clear. IMPRESSION: Mild frontal and parietal atrophy, stable. Prior infarct inferior left occipital lobe, stable. Mild  periventricular small vessel disease, likewise stable. No acute infarct demonstrable. No mass or hemorrhage. Foci of arterial vascular calcification noted. Foci of paranasal sinus disease noted. Patient intubated. Electronically Signed   By: Carmeron  Woodruff III M.D.   On: 01/28/2020 11:15   DG CHEST PORT 1 VIEW  Result Date: 02/02/2020 CLINICAL DATA:  ETT advancement EXAM: PORTABLE CHEST 1 VIEW COMPARISON:  02/17/2020 at 1022 hours FINDINGS: Interval advancement of endotracheal tube which now terminates 2.5 cm above the carina. Enteric tube courses below the diaphragm with distal tip beyond the inferior margin of the film. Low lung volumes. Stable cardiomegaly. Pulmonary vascular congestion without overt edema. No lobar consolidation. Left costophrenic angle was excluded from the field of view. No pneumothorax. IMPRESSION: 1. Interval advancement of endotracheal tube which now terminates 2.5 cm above the carina. 2. Otherwise, stable chest. Electronically Signed   By: Nicholas  Plundo D.O.   On: 02/05/2020 12:15   DG Chest Portable 1 View  Result Date: 01/30/2020 CLINICAL DATA:  Hypoxia EXAM: PORTABLE CHEST 1 VIEW COMPARISON:  October 08, 2019 FINDINGS: Endotracheal tube tip is at the T1 level, 8.2 cm above the carina. Nasogastric tube tip and side port in stomach. No pneumothorax. There is cardiomegaly with pulmonary venous hypertension. There is no evident edema or airspace opacity. No adenopathy appreciable by radiography. IMPRESSION: Tube positions as described without pneumothorax. It may be prudent to consider advancing endotracheal tube 4-5 cm. There is cardiomegaly with pulmonary vascular congestion. No edema or airspace opacity. Electronically Signed   By: Fermin  Woodruff III M.D.   On: 02/17/2020 10:40    Assessment and Plan:   1. Cardiac arrest: The patient presents several hours after cardiac arrest.  He was last noted to be well around 730.  His wife went into the bedroom and heard agonal  respirations.  He was unresponsive.  EMS was called.  He was found to have asystole initially.  He received 8 rounds of epinephrine and CPR.  Eventually he was found to have ventricular fibrillation and was cardioverted.  He now is in atrial flutter.  At this time we do not have any explanation.  He is in cardiogenic shock and requiring high doses of Levophed.  There is a hint of ST segment elevation in the chest leads.  I think that we should proceed to the Cath Lab for diagnostic heart catheterization.  He may also need an assist device.  2.  Acute on   chronic combined systolic and diastolic congestive heart failure: Continue aggressive approach for now.   3.  Bradycardia:  Pt developed severe bradycardia . Is now being transcutaneously paced.  No underlying rhythm when pacer is off     For questions or updates, please contact [facility] Please consult [email or web address] for contact info under    Signed, Louie Meaders, MD  02/10/2020 12:33 PM     Addendum:   We were able to find a cath note from October 11, 2019 Dr. Mcalhany performed a cath during a PV procedure with Dr. Cain. Under Notes tab. He has no obstructive CAD     Castor Gittleman, MD  01/28/2020 1:25 PM    [facility] [address],  [address] [place], [place]  [address] Phone: [phone]; Fax: [phone]     

## 2020-02-18 NOTE — Progress Notes (Signed)
ANTICOAGULATION CONSULT NOTE  Pharmacy Consult for heparin Indication: atrial fibrillation  Heparin Dosing Weight: 127.5 kg  Labs: Recent Labs    02/21/2020 1014 02/05/2020 1014 02/02/2020 1022 02/23/2020 1040 02/22/2020 1126  HGB 14.5   < > 16.3  --  15.3  HCT 46.8  --  48.0  --  45.0  PLT 221  --   --   --   --   LABPROT  --   --   --  17.0*  --   INR  --   --   --  1.4*  --   CREATININE 1.61*  --  1.30*  --   --    < > = values in this interval not displayed.    Assessment: 54 yom on Xarelto PTA for afib presenting s/p CPR with ROSC, intubated and code cool initiated. Pharmacy consulted to dose heparin for afib. CBC wnl, SCr 1.3. No active bleed issues reported. Patient previously had heparin SQ for VTE prophylaxis ordered this admit - no doses given.  Significant other states she does not know exactly which medications Mr. Thrall takes, but he took meds at 0730 on 7/26. However, Xarelto not filled since 30-day supply in April 2021.  Will f/u baseline heparin level prior to initiating heparin IV due to uncertainty surrounding Xarelto history PTA. If level is normal, will start heparin now. If elevated, will begin tomorrow and monitor heparin using aPTT while Xarelto would be expected to influence heparin levels.  Goal of Therapy:  Heparin level 0.3-0.7 units/ml aPTT 66-102 seconds Monitor platelets by anticoagulation protocol: Yes   Plan:  D/c heparin SQ order Check baseline aPTT/heparin level to determine when to start heparin IV (PTA timing of last dose uncertain) When appropriate to start heparin - No bolus. Begin heparin at 1050 units/hr (8 units/kg/hr) per hypothermia protocol Check 6hr aPTT from start Monitor daily heparin level/aPTT/CBC, s/sx bleeding F/u likely will need large rate increases once begins rewarming    Babs Bertin, PharmD, BCPS Clinical Pharmacist 02/12/2020 12:18 PM

## 2020-02-18 NOTE — Progress Notes (Signed)
Called to see if pt is available.unable to get pt due to  Pt in cath lab. Will try back later

## 2020-02-18 NOTE — Progress Notes (Signed)
  Echocardiogram 2D Echocardiogram was attempted but patient was having a procedure done in the room. We will try again as the schedule permits.   Tye Savoy 02/17/2020, 3:17 PM

## 2020-02-18 NOTE — Procedures (Signed)
Arterial Catheter Insertion Procedure Note  Maurice Nguyen  [id number]  24-Sep-1965  Date:2020-03-09  Time:1:13 PM    Provider Performing: Newt Lukes    Procedure: Insertion of Arterial Line (11155) without US guidance  Indication(s) Blood pressure monitoring and/or need for frequent ABGs  Consent Unable to obtain consent due to emergent nature of procedure.  Anesthesia None   Time Out Verified patient identification, verified procedure, site/side was marked, verified correct patient position, special equipment/implants available, medications/allergies/relevant history reviewed, required imaging and test results available.   Sterile Technique Maximal sterile technique including full sterile barrier drape, hand hygiene, sterile gown, sterile gloves, mask, hair covering, sterile ultrasound probe cover (if used).   Procedure Description Area of catheter insertion was cleaned with chlorhexidine and draped in sterile fashion. Without real-time ultrasound guidance an arterial catheter was placed into the right radial artery.  Appropriate arterial tracings confirmed on monitor.     Complications/Tolerance None; patient tolerated the procedure well.   EBL Minimal   Specimen(s) None

## 2020-02-18 NOTE — ED Notes (Signed)
Date and time results received: 02/16/2020 1133 (use smartphrase ".now" to insert current time)  Test: troponin Critical Value: 169  Name of Provider Notified: CCM  Orders Received? Or Actions Taken?: Actions Taken: no new orders

## 2020-02-18 NOTE — Progress Notes (Signed)
Notified provider of need to administer fluid bolus per sepsis protocol.

## 2020-02-18 NOTE — H&P (Signed)
NAME:  Maurice Nguyen, MRN:  [id number], DOB:  06-15-1966, LOS: 0 ADMISSION DATE:  02/22/2020, CONSULTATION DATE:  02/23/2020 REFERRING MD:  Langston Masker, CHIEF COMPLAINT:  Cardiac arrest  Brief History   [id number] s/p asystolic cardiac arrest. 46OLM downtime.   History of present illness   20yom with NICM, HFrEF, afib, HTN, T2DM who presented via EMS s/p cardiac arrest. Wife notes that she woke up around 0700 at which time her husband was ok. They got up and ate some breakfast and when back to sleep. When she woke back up around 0730, she noted that he had agonal breathing and was non-responsive. EMS was called and pt was noted to have a pulse at that time. Shortly thereafter, he developed asystole, required 8 rounds of cpr. He transitioned into vfib arrest which responded appropriately to one shock. On arrival to the ED, he was noted to be hypothermic with temp of 83.30F. Labs showing trop of 167, K 5.4. Head CT negative for acute process.   Past Medical History  NICM, HFrEF (30-35%), afib, HTN, PAD, T2DM  Significant Hospital Events   7/26 admission  Consults:  Cardiology  Procedures:  7/26 ETT  Significant Diagnostic Tests:  7/26 CXR>>pulm vascular congestion 7/26 head CT>>no acute findings  Micro Data:  7/26 blood cultures>> 7/26 urine culture>> 7/26 resp culture>>  Antimicrobials:  7/26 vancomycin 7/26 cefepime 7/26 flagyl  Interim history/subjective:  See above  Objective   Blood pressure (!) 111/47, pulse 97, temperature 97.8 F (36.6 C), resp. rate 16, height '6\' 2"'  (1.88 m), weight (!) 185.1 kg, SpO2 92 %.    Vent Mode: PRVC FiO2 (%):  [100 %] 100 % Set Rate:  [16 bmp] 16 bmp Vt Set:  [650 mL] 650 mL PEEP:  [5 cmH20] 5 cmH20  No intake or output data in the 24 hours ending 02/08/2020 1138 Filed Weights   02/01/2020 1045  Weight: (!) 185.1 kg    Examination: General: critically and chronically ill appear male HENT: ETT Lungs: diffuse crackles  bilaterally Cardiovascular: tachycardic rate, regular rhythm, extremities cold. Chronic venous stasis changes bilaterally Abdomen: obese, bs active Extremities: no significant LE edema. Boot on left foot. Neuro: non-responsive. Sluggish pupillary response. Does not withdraw to pain. No gag reflex. Skin: chronic venous insufficiency changes on LE. No rash.   Resolved Hospital Problem list   n/a  Assessment & Plan:   Asystolic cardiac arrest/Vfib cardiac arrest. ROSC achieved around 15mn. Unclear cause at this time. Possibly precipitated by respiratory arrest as his wife heard agonal breathing prior to losing pulse. Primary concerns would be respiratory failure vs arrhythmia vs PE vs MI vs electrolyte derangement.  NICM with HFrEF. Last echo in March 2021 showing EF of 30-35%. Heart cath 09/2019 showed mild non-obstructive disease except for severe disease in a small PL branch. Paroxysmal atrial fibrillation. CHADSVASc=5. Anticoagulated on xarelto. Shock--cardiogenic vs septic--favor cardiogenic. Currently on levo '@16mcg'  Plan TTM protocol to 36 degrees Vasopressor support with levo to be titrated to maintain MAP>65 F/u echocardiogram Cardiology consulted. Hold home antihypertensives Heparin gtt  Acute encephalopathy. Primary concern right now is hypoxic injury. Low suspicion for anoxic as pulse was lost while being evaluated by EMS. No acute findings on head CT on admission.  Plan: hold sedation. Further neuroprognostication to follow after 72h post rewarming.  Acute respiratory failure requiring mechanical ventilation. Unclear if this may have precipitated the cardiac arrest. CXR post intubation showed need to progress tube. Repeat CXR pending. ABG looks suprisingly good. Plan F/u  repeat cxr F/u repeat ABG at 1300  Sepsis criteria met. Pt with history of chronic foot infection. Wife denies recent s/s of infection. Plan: follow blood/urine cultures. Broad spectrum abx coverage with  vanc, cefepime and flagyl. Narrow pending culture results. Will hold off on fluids right now in the setting of HFrEF.  T2DM. q4h SSI with CBGs--adjust accordingly.  Liver shock 2/2 cardiac arrest. Continue to follow  Acute kidney injury likely related to hypoperfusion. Continue to monitor. Pressor support to maintain perfusion.   Best practice:  Diet: NPO Pain/Anxiety/Delirium protocol (if indicated): n/a VAP protocol (if indicated): yes DVT prophylaxis: heparin gtt GI prophylaxis: pepcid Glucose control: SSI Mobility: BR Code Status: Full Family Communication: updated Disposition: ICU  Labs   CBC: Recent Labs  Lab 02/05/2020 1014 01/31/2020 1022 02/11/2020 1126  WBC 26.4*  --   --   NEUTROABS PENDING  --   --   HGB 14.5 16.3 15.3  HCT 46.8 48.0 45.0  MCV 103.8*  --   --   PLT 221  --   --     Basic Metabolic Panel: Recent Labs  Lab 02/17/2020 1014 02/21/2020 1022 02/07/2020 1126  NA 136 138 136  K 5.5* 5.4* 4.3  CL 101 101  --   CO2 17*  --   --   GLUCOSE 306* 312*  --   BUN 25* 41*  --   CREATININE 1.61* 1.30*  --   CALCIUM 8.2*  --   --    GFR: Estimated Creatinine Clearance: 110.7 mL/min (A) (by C-G formula based on SCr of 1.3 mg/dL (H)). Recent Labs  Lab 02/13/2020 1014  WBC 26.4*    Liver Function Tests: Recent Labs  Lab 02/19/2020 1014  AST 785*  ALT 641*  ALKPHOS 124  BILITOT 1.7*  PROT 6.1*  ALBUMIN 2.9*   No results for input(s): LIPASE, AMYLASE in the last 168 hours. No results for input(s): AMMONIA in the last 168 hours.  ABG    Component Value Date/Time   PHART 7.385 02/05/2020 1126   PCO2ART 36.5 02/15/2020 1126   PO2ART 123 (H) 02/20/2020 1126   HCO3 24.2 02/17/2020 1126   TCO2 26 01/30/2020 1126   ACIDBASEDEF 4.0 (H) 01/25/2020 1126   O2SAT 99.0 02/13/2020 1126     Coagulation Profile: Recent Labs  Lab 01/31/2020 1040  INR 1.4*    Cardiac Enzymes: No results for input(s): CKTOTAL, CKMB, CKMBINDEX, TROPONINI in the last 168  hours.  HbA1C: Hgb A1c MFr Bld  Date/Time Value Ref Range Status  10/08/2019 10:24 PM 11.2 (H) 4.8 - 5.6 % Final    Comment:    (NOTE) Pre diabetes:          5.7%-6.4% Diabetes:              >6.4% Glycemic control for   <7.0% adults with diabetes   11/07/2014 05:34 PM 8.6 (H) 4.8 - 5.6 % Final    Comment:    (NOTE)         Pre-diabetes: 5.7 - 6.4         Diabetes: >6.4         Glycemic control for adults with diabetes: <7.0     CBG: Recent Labs  Lab 01/30/2020 1127  GLUCAP 197*    Review of Systems:   Unable to obtain due to critical illness  Past Medical History  He,  has a past medical history of A-fib ([place]), Anticoagulated, on Xarelto, Chads2Vas score 2 (10/02/2013), Back pain,  chronic (07/13/2012), CHF (congestive heart failure) ([place]), Chronic ulcer of lower extremity ([place]), CKD (chronic kidney disease), stage II, Complication of anesthesia, Hypertension (07/13/2012), Irregular heart beat, Lumbar herniated disc, Morbid obesity ([place]), Obesity, morbid, BMI 50 or higher ([place]) (07/13/2012), 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   Surgical History    Past Surgical History:  Procedure Laterality Date  . ABSCESS DRAINAGE    . APPLICATION OF A-CELL OF EXTREMITY Left 12/05/2019   Procedure: Application Of A-Cell Of Extremity;  Surgeon: Trula Slade, DPM;  Location: [facility];  Service: Podiatry;  Laterality: Left;  . COLONOSCOPY  08/22/2012   Procedure: COLONOSCOPY;  Surgeon: Lear Ng, MD;  Location: [facility];  Service: Endoscopy;  Laterality: N/A;  . CORONARY STENT PLACEMENT Left 10/11/2019   Procedure: left heart cath and coronary angiography;  Surgeon: Burnell Blanks, MD;  Location: [facility];  Service: Cardiovascular;  Laterality: Left;  . DENTAL SURGERY    . INCISION AND DRAINAGE OF WOUND Left 12/05/2019   Procedure: IRRIGATION AND DEBRIDEMENT LEFT FOOT   WOUND AND APPLICATION GRAFT;  Surgeon: Trula Slade, DPM;  Location: [facility];  Service: Podiatry;  Laterality: Left;  . LOWER EXTREMITY ANGIOGRAM Bilateral 10/11/2019   Procedure: LOWER EXTREMITY ANGIOGRAM;  Surgeon: Waynetta Sandy, MD;  Location: [facility];  Service: Vascular;  Laterality: Bilateral;  . WOUND DEBRIDEMENT Left 10/10/2019   Procedure: DEBRIDEMENT WOUND;  Surgeon: Trula Slade, DPM;  Location: [facility];  Service: Podiatry;  Laterality: Left;  . WOUND DEBRIDEMENT Left 10/15/2019   Procedure: DEBRIDEMENT WOUND, POSSIBLE WOUND VAC APPLICATION;  Surgeon: Trula Slade, DPM;  Location: [facility];  Service: Podiatry;  Laterality: Left;     Social History   reports that he has never smoked. He has never used smokeless tobacco. He reports that he does not drink alcohol and does not use drugs.   Family History   His family history is negative for Heart disease.   Allergies No Known Allergies   Home Medications  Prior to Admission medications   Medication Sig Start Date End Date Taking? Authorizing Provider  Accu-Chek FastClix Lancets MISC 1 each by Other route 3 (three) times daily. 11/14/19   [provider]  amoxicillin-clavulanate (AUGMENTIN) 875-125 MG tablet Take 1 tablet by mouth 2 (two) times daily. Patient not taking: Reported on 01/26/2020 10/30/19   Trula Slade, DPM  ampicillin (PRINCIPEN) 500 MG capsule Take 1 capsule (500 mg total) by mouth 4 (four) times daily. Patient not taking: Reported on 02/20/2020 11/15/19   Trula Slade, DPM  atorvastatin (LIPITOR) 80 MG tablet Take 1 tablet (80 mg total) by mouth daily at 6 PM. 10/18/19   Gonfa, Charlesetta Ivory, MD  benzonatate (TESSALON) 200 MG capsule Take 1 capsule (200 mg total) by mouth 3 (three) times daily as needed for cough. Patient not taking: Reported on 01/26/2020 10/18/19   Mercy Riding, MD  carvedilol (COREG) 12.5 MG tablet Take 1 tablet (12.5 mg total) by mouth 2 (two) times daily with a meal.  10/18/19   Mercy Riding, MD  empagliflozin (JARDIANCE) 10 MG TABS tablet Take 10 mg by mouth daily before breakfast. 10/18/19   Mercy Riding, MD  Ensure Max Protein (ENSURE MAX PROTEIN) LIQD Take 330 mLs (11 oz total) by mouth 2 (two) times daily. 10/18/19   Mercy Riding, MD  furosemide (LASIX) 80 MG tablet Take 1 tablet (80 mg  total) by mouth 2 (two) times daily. 10/18/19   Mercy Riding, MD  gabapentin (NEURONTIN) 300 MG capsule Take 1 capsule (300 mg total) by mouth 2 (two) times daily. 10/18/19 12/17/19  Mercy Riding, MD  glucose blood (ACCU-CHEK GUIDE) test strip 1 each by Other route 3 (three) times daily before meals. 11/14/19 11/13/20  [provider]  HYDROcodone-acetaminophen (NORCO/VICODIN) 5-325 MG tablet Take 1 tablet by mouth every 6 (six) hours as needed. Patient not taking: Reported on 02/05/2020 12/05/19   Trula Slade, DPM  insulin aspart (NOVOLOG) 100 UNIT/ML injection Inject 0-20 Units into the skin 3 (three) times daily with meals. CBG 70 - 120: 0 units CBG 121 - 150: 3 units CBG 151 - 200: 4 units CBG 201 - 250: 7 units CBG 251 - 300: 11 units CBG 301 - 350: 15 units CBG 351 - 400: 20 units CBG > 400: call MD and obtain STAT lab verification 10/18/19   Mercy Riding, MD  insulin aspart protamine - aspart (NOVOLOG MIX 70/30 FLEXPEN) (70-30) 100 UNIT/ML FlexPen Inject 60 Units into the skin 2 (two) times daily. 11/14/19   [provider]  insulin NPH Human (NOVOLIN N) 100 UNIT/ML injection Inject 0.35 mLs (35 Units total) into the skin 2 (two) times daily at 8 am and 10 pm. 10/18/19   Mercy Riding, MD  metFORMIN (GLUCOPHAGE-XR) 500 MG 24 hr tablet Take 2 tablets (1,000 mg total) by mouth in the morning and at bedtime. 10/18/19   Mercy Riding, MD  Multiple Vitamin (MULTIVITAMIN WITH MINERALS) TABS tablet Take 1 tablet by mouth daily. 10/18/19   Mercy Riding, MD  nitroGLYCERIN (NITROSTAT) 0.4 MG SL tablet Place 1 tablet (0.4 mg total) under the tongue every 5  (five) minutes x 3 doses as needed for chest pain. 02/03/15   Kelvin Cellar, MD  oxyCODONE-acetaminophen (PERCOCET/ROXICET) 5-325 MG tablet Take 1 tablet by mouth every 8 (eight) hours as needed. 12/03/19   [provider]  polyethylene glycol (MIRALAX / GLYCOLAX) 17 g packet Take 17 g by mouth 2 (two) times daily as needed for mild constipation. 10/18/19   Mercy Riding, MD  rivaroxaban (XARELTO) 20 MG TABS tablet Take 1 tablet (20 mg total) by mouth daily with supper. 10/18/19   Mercy Riding, MD  saccharomyces boulardii (FLORASTOR) 250 MG capsule Take 1 capsule (250 mg total) by mouth 2 (two) times daily. 10/18/19   Mercy Riding, MD  sacubitril-valsartan (ENTRESTO) 24-26 MG Take 1 tablet by mouth 2 (two) times daily. 10/18/19   Mercy Riding, MD  sildenafil (VIAGRA) 25 MG tablet Take by mouth. 01/03/20   [provider]  SYNJARDY XR 11-998 MG TB24 Take 1 tablet by mouth 2 (two) times daily. 11/17/19   [provider]  Vitamin D, Ergocalciferol, (DRISDOL) 1.25 MG (50000 UNIT) CAPS capsule Take 1 capsule (50,000 Units total) by mouth every 7 (seven) days. 10/23/19   Mercy Riding, MD     Mitzi Hansen, MD INTERNAL MEDICINE RESIDENT PGY-2 02/05/2020  11:38 AM

## 2020-02-18 NOTE — Progress Notes (Addendum)
Called eLink to discuss the following;  On TTM, goal 36C, water temp trends indicate pt shivering, temp 37.2C despite counterwarming. Pt grimaces and bilat UE movement/flexion at wrist and raises arms into air, to minimal stimulation. Does not open eyes or follow commands.  Does not have anything for pain or sedation.   HR 120-130s, Hx of Afib but presently appears sinus with PVCs, varying perfusion on aline even with sinus beats, BP labile, titrating levo - see MAR. Will set up and measure CVP.   Decr urine output - 15 mL. Foley in place, flushed, patent. Will bladder scan.   ELink RN to discuss with Dr Arsenio Loader.

## 2020-02-18 NOTE — Progress Notes (Signed)
Called to see if pt is available. Pt is getting lines

## 2020-02-18 NOTE — ED Provider Notes (Signed)
[facility] EMERGENCY DEPARTMENT Provider Note   CSN: [id number] Arrival date & time: 02/21/2020  5462     History Chief Complaint  Patient presents with  . Cardiac Arrest    Maurice Nguyen is a 54 y.o. male w/ hx of CHF, diabetes, morbid obesity, presenting by EMS s/p asystole cardiac arrest with ROSC in the field.  Patient arrives with king airway LMA in place and unresponsive.  No immediate family available on arrival.  EMS reports they were called to the house by the patient's wife who found him obtunded in the bed this morning, after he had been seen awake and going about his business earlier that morning.  There was no report of any recent illness or symptoms from the family.  EMS reports patient had pulses with agonal breathing on their arrival, and subsequent went into asystole.  They performed CPR (initiated around [phone] by fire resuce), placed a left tibial IO, and give 8 rounds of epinephrine in total, with conversion from asystole into "fine V-tach," and subsequently defibrillated the patient into sinus tachycardia with regain of pulses.  They gave the patient 2L IO fluids, IO amiodarone, and IO sodium bicarbonate in the field.  End tidal CO2 was > 30 on their monitor with their LMA airway.  HPI     Past Medical History:  Diagnosis Date  . A-fib (HCC)   . Anticoagulated, on Xarelto, Chads2Vas score 2 10/02/2013  . Back pain, chronic 07/13/2012  . CHF (congestive heart failure) (HCC)   . 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 CKD (chronic kidney disease), stage II   . Complication of anesthesia    " THEY LOST ME AND BROUGHT ME BACK "  . Hypertension 07/13/2012  . Irregular heart beat   . Lumbar herniated disc   . Morbid obesity (HCC)   . Obesity, morbid, BMI 50 or higher (HCC) 07/13/2012  . 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 Renal insufficiency 10/08/2019  . Shortness of breath   . Type II diabetes mellitus (HCC) 07/13/2012      Patient Active Problem List   Diagnosis Date Noted  . Cardiac arrest (HCC) 02/11/2020  . Asystole (HCC)   . Acute respiratory failure (HCC)   . No-show for appointment 11/30/2019  . NICM (nonischemic cardiomyopathy) (HCC)   . Diabetic foot ulcer (HCC) 10/09/2019  . Diabetic ulcer of left foot (HCC) 10/08/2019  . Acute on chronic combined systolic (congestive) and diastolic (congestive) heart failure (HCC) 10/08/2019  . Hyperlipidemia associated with type 2 diabetes mellitus (HCC) 11/09/2017  . Morbid obesity with BMI of 50.0-59.9, adult (HCC) 11/09/2017  . B12 deficiency 06/09/2017  . Neuropathy associated with endocrine disorder (HCC) 05/03/2017  . Other fatigue 05/03/2017  . OSA (obstructive sleep apnea) 11/11/2016  . Need for hepatitis C screening test 12/01/2015  . Sepsis (HCC) 10/01/2015  . CAP (community acquired pneumonia) 10/01/2015  . Right foot pain 10/01/2015  . AKI (acute kidney injury) (HCC) 10/01/2015  . Hematemesis 10/01/2015  . Chronic bilateral low back pain without sciatica 09/02/2015  . Chronic ulcer of lower extremity (HCC) 04/08/2015  . Chronic combined systolic and diastolic heart failure (HCC) 04/08/2015  . Type 2 diabetes mellitus (HCC) 01/31/2015  . Anticoagulated, on Xarelto, Chads2Vas score 2 10/02/2013  . History of anticoagulant therapy 10/02/2013  . Atrial fibrillation with RVR 09/27/2013  . Hypertensive urgency 09/27/2013  . Acute combined systolic and diastolic CHF, NYHA class 4, difficult to  assess Diastolic function on echo 09/27/2013  . New onset atrial fibrillation (HCC) 08/22/2012  . 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 Obesity, morbid, BMI 50 or higher (HCC) 07/13/2012  . Back pain, chronic 07/13/2012  . Diabetes mellitus type 2 with complications, uncontrolled (HCC) 04/18/2009  . Essential hypertension 04/18/2009  . OTHER POSTSURGICAL STATUS OTHER 04/18/2009    Past Surgical History:  Procedure Laterality Date  . ABSCESS DRAINAGE     . APPLICATION OF A-CELL OF EXTREMITY Left 12/05/2019   Procedure: Application Of A-Cell Of Extremity;  Surgeon: Vivi Barrack, DPM;  Location: [facility] OR;  Service: Podiatry;  Laterality: Left;  . COLONOSCOPY  08/22/2012   Procedure: COLONOSCOPY;  Surgeon: Shirley Friar, MD;  Location: [facility] ENDOSCOPY;  Service: Endoscopy;  Laterality: N/A;  . CORONARY STENT PLACEMENT Left 10/11/2019   Procedure: left heart cath and coronary angiography;  Surgeon: Kathleene Hazel, MD;  Location: [facility] OR;  Service: Cardiovascular;  Laterality: Left;  . DENTAL SURGERY    . INCISION AND DRAINAGE OF WOUND Left 12/05/2019   Procedure: IRRIGATION AND DEBRIDEMENT LEFT FOOT  WOUND AND APPLICATION GRAFT;  Surgeon: Vivi Barrack, DPM;  Location: [facility] OR;  Service: Podiatry;  Laterality: Left;  . LOWER EXTREMITY ANGIOGRAM Bilateral 10/11/2019   Procedure: LOWER EXTREMITY ANGIOGRAM;  Surgeon: Maeola Harman, MD;  Location: [facility] OR;  Service: Vascular;  Laterality: Bilateral;  . WOUND DEBRIDEMENT Left 10/10/2019   Procedure: DEBRIDEMENT WOUND;  Surgeon: Vivi Barrack, DPM;  Location: [facility] OR;  Service: Podiatry;  Laterality: Left;  . WOUND DEBRIDEMENT Left 10/15/2019   Procedure: DEBRIDEMENT WOUND, POSSIBLE WOUND VAC APPLICATION;  Surgeon: Vivi Barrack, DPM;  Location: [facility] OR;  Service: Podiatry;  Laterality: Left;       Family History  Problem Relation Age of Onset  . Heart disease Neg Hx     Social History   Tobacco Use  . Smoking status: Never Smoker  . Smokeless tobacco: Never Used  Vaping Use  . Vaping Use: Never used  Substance Use Topics  . Alcohol use: No    Comment: rarely  . Drug use: No    Home Medications Prior to Admission medications   Medication Sig Start Date End Date Taking? Authorizing Provider  atorvastatin (LIPITOR) 80 MG tablet Take 1 tablet (80 mg total) by mouth daily at 6 PM. 10/18/19  Yes Gonfa, Boyce Medici, MD  carvedilol (COREG) 12.5 MG tablet Take 1 tablet  (12.5 mg total) by mouth 2 (two) times daily with a meal. 10/18/19  Yes Gonfa, Taye T, MD  furosemide (LASIX) 80 MG tablet Take 1 tablet (80 mg total) by mouth 2 (two) times daily. 10/18/19  Yes Almon Hercules, MD  gabapentin (NEURONTIN) 300 MG capsule Take 1 capsule (300 mg total) by mouth 2 (two) times daily. 10/18/19 02/17/21 Yes Almon Hercules, MD  insulin aspart protamine - aspart (NOVOLOG MIX 70/30 FLEXPEN) (70-30) 100 UNIT/ML FlexPen Inject 60 Units into the skin 2 (two) times daily. 11/14/19  Yes [provider]  nitroGLYCERIN (NITROSTAT) 0.4 MG SL tablet Place 1 tablet (0.4 mg total) under the tongue every 5 (five) minutes x 3 doses as needed for chest pain. 02/03/15  Yes [name], MD  oxyCODONE-acetaminophen (PERCOCET/ROXICET) 5-325 MG tablet Take 1 tablet by mouth every 8 (eight) hours as needed for moderate pain.  12/03/19  Yes [provider]  rivaroxaban (XARELTO) 20 MG TABS tablet Take 1 tablet (20 mg total) by mouth daily with supper. 10/18/19  Yes Almon Hercules, MD  sildenafil (VIAGRA) 25 MG tablet Take 50 mg by mouth daily as needed for erectile dysfunction.  01/03/20  Yes [provider]  SYNJARDY XR 11-998 MG TB24 Take 1 tablet by mouth 2 (two) times daily with a meal.  11/17/19  Yes [provider]  Accu-Chek FastClix Lancets MISC 1 each by Other route 3 (three) times daily. 11/14/19   [provider]  amoxicillin-clavulanate (AUGMENTIN) 875-125 MG tablet Take 1 tablet by mouth 2 (two) times daily. Patient not taking: Reported on 02/14/2020 10/30/19   Vivi Barrack, DPM  ampicillin (PRINCIPEN) 500 MG capsule Take 1 capsule (500 mg total) by mouth 4 (four) times daily. Patient not taking: Reported on 02/07/2020 11/15/19   Vivi Barrack, DPM  benzonatate (TESSALON) 200 MG capsule Take 1 capsule (200 mg total) by mouth 3 (three) times daily as needed for cough. Patient not taking: Reported on 01/24/2020 10/18/19   Almon Hercules, MD    empagliflozin (JARDIANCE) 10 MG TABS tablet Take 10 mg by mouth daily before breakfast. Patient not taking: Reported on 02/07/2020 10/18/19   Almon Hercules, MD  Ensure Max Protein (ENSURE MAX PROTEIN) LIQD Take 330 mLs (11 oz total) by mouth 2 (two) times daily. 10/18/19   Almon Hercules, MD  glucose blood (ACCU-CHEK GUIDE) test strip 1 each by Other route 3 (three) times daily before meals. 11/14/19 11/13/20  [provider]  HYDROcodone-acetaminophen (NORCO/VICODIN) 5-325 MG tablet Take 1 tablet by mouth every 6 (six) hours as needed. Patient not taking: Reported on 02/04/2020 12/05/19   Vivi Barrack, DPM  insulin aspart (NOVOLOG) 100 UNIT/ML injection Inject 0-20 Units into the skin 3 (three) times daily with meals. CBG 70 - 120: 0 units CBG 121 - 150: 3 units CBG 151 - 200: 4 units CBG 201 - 250: 7 units CBG 251 - 300: 11 units CBG 301 - 350: 15 units CBG 351 - 400: 20 units CBG > 400: call MD and obtain STAT lab verification Patient not taking: Reported on 01/24/2020 10/18/19   Almon Hercules, MD  insulin NPH Human (NOVOLIN N) 100 UNIT/ML injection Inject 0.35 mLs (35 Units total) into the skin 2 (two) times daily at 8 am and 10 pm. Patient not taking: Reported on 02/16/2020 10/18/19   Almon Hercules, MD  metFORMIN (GLUCOPHAGE-XR) 500 MG 24 hr tablet Take 2 tablets (1,000 mg total) by mouth in the morning and at bedtime. Patient not taking: Reported on 01/24/2020 10/18/19   Almon Hercules, MD  Multiple Vitamin (MULTIVITAMIN WITH MINERALS) TABS tablet Take 1 tablet by mouth daily. 10/18/19   Almon Hercules, MD  polyethylene glycol (MIRALAX / GLYCOLAX) 17 g packet Take 17 g by mouth 2 (two) times daily as needed for mild constipation. 10/18/19   Almon Hercules, MD  saccharomyces boulardii (FLORASTOR) 250 MG capsule Take 1 capsule (250 mg total) by mouth 2 (two) times daily. 10/18/19   Almon Hercules, MD  sacubitril-valsartan (ENTRESTO) 24-26 MG Take 1 tablet by mouth 2 (two) times daily. 10/18/19    Almon Hercules, MD  Vitamin D, Ergocalciferol, (DRISDOL) 1.25 MG (50000 UNIT) CAPS capsule Take 1 capsule (50,000 Units total) by mouth every 7 (seven) days. 10/23/19   Almon Hercules, MD    Allergies    Patient has no known allergies.  Review of Systems   Review of Systems  Unable to perform ROS: Patient unresponsive (level 5 caveat)  Physical Exam Updated Vital Signs BP (!) 84/61   Pulse (!) 116   Temp 97.8 F (36.6 C)   Resp 16   Ht  (1.88 m)   Wt (!) 185.1 kg   SpO2 100%   BMI 52.39 kg/m   Physical Exam Vitals and nursing note reviewed.  Constitutional:      Appearance: He is well-developed. He is obese.     Comments: Obtunded, LMA airway in place  HENT:     Head: Normocephalic and atraumatic.  Eyes:     Comments: Pupils nonreactive to light  Cardiovascular:     Rate and Rhythm: Regular rhythm. Tachycardia present.     Comments: Palpable femoral pulses, thready radial pulses Pulmonary:     Comments: 100% O2 via LMA bagging, coarse breath sounds bilaterally Abdominal:     Tenderness: There is no abdominal tenderness.  Musculoskeletal:     Cervical back: Neck supple.  Skin:    General: Skin is dry.     Comments: Extremities cool to touch Left foot in Cam boot on arrival, diabetic foot ulcer noted  Neurological:     Comments: GCS 3     ED Results / Procedures / Treatments   Labs (all labs ordered are listed, but only abnormal results are displayed) Labs Reviewed  CBC WITH DIFFERENTIAL/PLATELET - Abnormal; Notable for the following components:      Result Value   WBC 26.4 (*)    MCV 103.8 (*)    nRBC 0.5 (*)    Neutro Abs 15.7 (*)    Lymphs Abs 7.7 (*)    Basophils Absolute 0.2 (*)    Abs Immature Granulocytes 2.21 (*)    All other components within normal limits  BRAIN NATRIURETIC PEPTIDE - Abnormal; Notable for the following components:   B Natriuretic Peptide 158.1 (*)    All other components within normal limits  COMPREHENSIVE METABOLIC  PANEL - Abnormal; Notable for the following components:   Potassium 5.5 (*)    CO2 17 (*)    Glucose, Bld 306 (*)    BUN 25 (*)    Creatinine, Ser 1.61 (*)    Calcium 8.2 (*)    Total Protein 6.1 (*)    Albumin 2.9 (*)    AST 785 (*)    ALT 641 (*)    Total Bilirubin 1.7 (*)    GFR calc non Af Amer 47 (*)    GFR calc Af Amer 55 (*)    Anion gap 18 (*)    All other components within normal limits  PROTIME-INR - Abnormal; Notable for the following components:   Prothrombin Time 17.0 (*)    INR 1.4 (*)    All other components within normal limits  LACTIC ACID, PLASMA - Abnormal; Notable for the following components:   Lactic Acid, Venous 7.1 (*)    All other components within normal limits  I-STAT CHEM 8, ED - Abnormal; Notable for the following components:   Potassium 5.4 (*)    BUN 41 (*)    Creatinine, Ser 1.30 (*)    Glucose, Bld 312 (*)    Calcium, Ion 0.96 (*)    TCO2 21 (*)    All other components within normal limits  I-STAT ARTERIAL BLOOD GAS, ED - Abnormal; Notable for the following components:   pO2, Arterial 123 (*)    Acid-base deficit 4.0 (*)    Calcium, Ion 1.12 (*)    All other components within normal limits  CBG  MONITORING, ED - Abnormal; Notable for the following components:   Glucose-Capillary 197 (*)    All other components within normal limits  POCT I-STAT 7, (LYTES, BLD GAS, ICA,H+H) - Abnormal; Notable for the following components:   pO2, Arterial 117 (*)    Acid-base deficit 3.0 (*)    Calcium, Ion 1.04 (*)    All other components within normal limits  TROPONIN I (HIGH SENSITIVITY) - Abnormal; Notable for the following components:   Troponin I (High Sensitivity) 169 (*)    All other components within normal limits  SARS CORONAVIRUS 2 BY RT PCR (HOSPITAL ORDER, PERFORMED IN [facility])  CULTURE, BLOOD (ROUTINE X 2)  CULTURE, BLOOD (ROUTINE X 2)  URINE CULTURE  URINE CULTURE  CULTURE, RESPIRATORY  BLOOD GAS, ARTERIAL  LACTIC  ACID, PLASMA  URINALYSIS, ROUTINE W REFLEX MICROSCOPIC  BLOOD GAS, ARTERIAL  BLOOD GAS, ARTERIAL  HEMOGLOBIN A1C  BASIC METABOLIC PANEL  BASIC METABOLIC PANEL  CBC  HEPARIN LEVEL (UNFRACTIONATED)  APTT  RAPID URINE DRUG SCREEN, HOSP PERFORMED  LACTIC ACID, PLASMA  LACTIC ACID, PLASMA  TROPONIN I (HIGH SENSITIVITY)    EKG None  Radiology CT Head Wo Contrast  Result Date: 02-19-2020 CLINICAL DATA:  Cardiac arrest with altered mental status EXAM: CT HEAD WITHOUT CONTRAST TECHNIQUE: Contiguous axial images were obtained from the base of the skull through the vertex without intravenous contrast. COMPARISON:  Dec 11, 2019 FINDINGS: Brain: Ventricles are normal in size and configuration. There is mild frontal and parietal lobe atrophy, stable. There is no intracranial mass, hemorrhage, extra-axial fluid collection, or midline shift. There is evidence of a prior infarct in the inferior most aspect of the left occipital lobe, stable. There is slight small vessel disease in the centra semiovale bilaterally. No acute infarct is appreciable. Vascular: No hyperdense vessel. There are foci of calcification in each carotid siphon region. Skull: The bony calvarium appears intact. Sinuses/Orbits: Patient is intubated. There is mucosal thickening in multiple ethmoid air cells. There is mucosal thickening in the anterior sphenoid sinus. There is opacification in posterior nares regions, likely a function of the intubation. Orbits appear symmetric bilaterally. Other: Mastoid air cells are clear. IMPRESSION: Mild frontal and parietal atrophy, stable. Prior infarct inferior left occipital lobe, stable. Mild periventricular small vessel disease, likewise stable. No acute infarct demonstrable. No mass or hemorrhage. Foci of arterial vascular calcification noted. Foci of paranasal sinus disease noted. Patient intubated. Electronically Signed   By: Bretta Bang III M.D.   On: 02/19/20 11:15   CARDIAC  CATHETERIZATION  Result Date: 2020-02-19 Successful placement of a temporary transvenous pacemaker into the RV.  DG CHEST PORT 1 VIEW  Result Date: 2020-02-19 CLINICAL DATA:  Status post central line placement today. EXAM: PORTABLE CHEST 1 VIEW COMPARISON:  Single-view of the chest earlier today. FINDINGS: The patient has a new left IJ approach central venous catheter with the tip projecting in the mid superior vena cava. No pneumothorax. NG tube and endotracheal tube remain in place in good position. Lung volumes are low but the lungs appear clear. IMPRESSION: New left IJ approach central venous catheter tip projects in the mid superior vena cava. No pneumothorax. Endotracheal tube and NG tube in good position. Lungs appear clear. Electronically Signed   By: Drusilla Kanner M.D.   On: February 19, 2020 15:46   DG CHEST PORT 1 VIEW  Result Date: 02/19/20 CLINICAL DATA:  ETT advancement EXAM: PORTABLE CHEST 1 VIEW COMPARISON:  02-19-2020 at 1022 hours FINDINGS: Interval  advancement of endotracheal tube which now terminates 2.5 cm above the carina. Enteric tube courses below the diaphragm with distal tip beyond the inferior margin of the film. Low lung volumes. Stable cardiomegaly. Pulmonary vascular congestion without overt edema. No lobar consolidation. Left costophrenic angle was excluded from the field of view. No pneumothorax. IMPRESSION: 1. Interval advancement of endotracheal tube which now terminates 2.5 cm above the carina. 2. Otherwise, stable chest. Electronically Signed   By: Duanne Guess D.O.   On: 02/17/2020 12:15   DG Chest Portable 1 View  Result Date: 01/29/2020 CLINICAL DATA:  Hypoxia EXAM: PORTABLE CHEST 1 VIEW COMPARISON:  October 08, 2019 FINDINGS: Endotracheal tube tip is at the T1 level, 8.2 cm above the carina. Nasogastric tube tip and side port in stomach. No pneumothorax. There is cardiomegaly with pulmonary venous hypertension. There is no evident edema or airspace opacity. No  adenopathy appreciable by radiography. IMPRESSION: Tube positions as described without pneumothorax. It may be prudent to consider advancing endotracheal tube 4-5 cm. There is cardiomegaly with pulmonary vascular congestion. No edema or airspace opacity. Electronically Signed   By: Bretta Bang III M.D.   On: 02/21/2020 10:40    Procedures .Critical Care Performed by: Terald Sleeper, MD Authorized by: Terald Sleeper, MD   Critical care provider statement:    Critical care time (minutes):  55   Critical care was necessary to treat or prevent imminent or life-threatening deterioration of the following conditions:  Cardiac failure, respiratory failure and sepsis   Critical care was time spent personally by me on the following activities:  Discussions with consultants, evaluation of patient's response to treatment, examination of patient, ordering and performing treatments and interventions, ordering and review of laboratory studies, ordering and review of radiographic studies, pulse oximetry, re-evaluation of patient's condition, obtaining history from patient or surrogate and review of old charts Comments:     PEA arrest requiring frequent bedside reassessment, management of hyperkalemia, consultation with critical care Sepsis alert with IV antibiotics   (including critical care time)  Medications Ordered in ED Medications  docusate (COLACE) 50 MG/5ML liquid 100 mg ( Oral MAR Unhold 02/15/2020 1425)  polyethylene glycol (MIRALAX / GLYCOLAX) packet 17 g ( Oral MAR Unhold 01/30/2020 1425)  vancomycin (VANCOCIN) 2,500 mg in sodium chloride 0.9 % 500 mL IVPB ( Intravenous MAR Unhold 02/04/2020 1425)  ceFEPIme (MAXIPIME) 2 g in sodium chloride 0.9 % 100 mL IVPB ( Intravenous MAR Unhold 02/21/2020 1425)  docusate sodium (COLACE) capsule 100 mg ( Oral MAR Unhold 02/07/2020 1425)  polyethylene glycol (MIRALAX / GLYCOLAX) packet 17 g ( Oral MAR Unhold 02/08/2020 1425)  0.9 %  sodium chloride infusion (has no  administration in time range)  vancomycin (VANCOREADY) IVPB 1250 mg/250 mL ( Intravenous MAR Unhold 02/12/2020 1425)  0.9 %  sodium chloride infusion (has no administration in time range)  insulin aspart (novoLOG) injection 0-6 Units ( Subcutaneous MAR Unhold 02/10/2020 1425)  norepinephrine (LEVOPHED) 4mg  in premix infusion ( Intravenous MAR Unhold 02/01/2020 1425)  pantoprazole sodium (PROTONIX) 40 mg/20 mL oral suspension 40 mg ( Per Tube MAR Unhold 02/08/2020 1425)  heparin ADULT infusion 100 units/mL (25000 units/28mL sodium chloride 0.45%) (has no administration in time range)  etomidate (AMIDATE) injection (25 mg Intravenous Given 02/11/2020 1013)  rocuronium (ZEMURON) injection (100 mg Intravenous Given 02/22/2020 1013)  calcium gluconate inj 10% (1 g) URGENT USE ONLY! (1 g Intravenous Given 02/09/2020 1020)  ceFEPIme (MAXIPIME) 2 g in sodium chloride 0.9 %  100 mL IVPB (0 g Intravenous Stopped Mar 12, 2020 1330)  sodium bicarbonate injection 150 mEq (150 mEq Intravenous Given Mar 12, 2020 1350)    ED Course  I have reviewed the triage vital signs and the nursing notes.  Pertinent labs & imaging results that were available during my care of the patient were reviewed by me and considered in my medical decision making (see chart for details).  54 yo diabetic male who is morbidly obses presenting s/p asystole cardiac arrest with conversion to V Tach and then into sinus tachycardia in the field, with 30+ minutes of CPR and multiple rounds of epinephrine given, as well as sodium bicarb and amiodarone in the field.    On arrival BP was stable and he was oxygenating well with the LMA with good end tidal.  His GCS was 3.  An additional proximal IV was placed in the left [facility] and he was intubated with RSI medications (25 mg etomidate and 100 mg rocuronium) for airway protection.  The immediate cause of his collapse was not clear on his arrival.  His ECG did not show STEMI, and his normal blood pressure and oxygenation  levels did not suggest a massive PE.  I ordered calcium gluconate to give empirically to cover for the possibility of hyperkalemia (K+ subsequently returned at 5.4).  He had already received 2L NS prior to arrival by EMS, and this history did not seem consistent with hypovolemic shock.  CTH showed no acute brain bleed or SAH.  Labs did eventually demonstrate a leukocytosis of 26.4 in setting of his hypothermia, which prompted a full sepsis w/u and BS antibiotics initiated.  At this point critical care team assumed care of the patient.  I personally reviewed the patient's labs as noted above, his prior history and his ECG  Clinical Course as of Feb 18 1635  Mon 03-12-2020  1038 Hypotensive 80/60 now on peripheral levophed   [MT]  1055 Crit care consulted, patient pending CTH now, hypothermic on arrival - does not require immediate cardiac cooling.  History remains inconsistent with sepsis.   [MT]  1107 Critical care team at the bedside, after a discussion we agreed to initiate IV antibiotics and a CODE sepsis empirically.  The crit care MD is talking to the patient's wife now.  Plan for ICU admission.   [MT]    Clinical Course User Index [MT] Delon Revelo, Kermit Balo, MD    Final Clinical Impression(s) / ED Diagnoses Final diagnoses:  Acute respiratory failure [facility])  Cardiac arrest [facility])    Rx / DC Orders ED Discharge Orders    None       Terald Sleeper, MD 03/12/20 [phone]

## 2020-02-18 NOTE — Progress Notes (Addendum)
eLink Physician-Brief Progress Note Patient Name: Maurice Nguyen DOB: [birth year] MRN: [id number]   Date of Service  01/28/2020  HPI/Events of Note  Oliguria - Sinus tachycardia - HD = 115.   eICU Interventions  Plan: 1. Monitor CVP now and Q 4 hours.  2. Bolus with 0.9 NaCl 1 liter IV over 1 hour now.      Intervention Category Major Interventions: Arrhythmia - evaluation and management;Other:  Lenell Antu 01/28/2020, 10:46 PM

## 2020-02-18 NOTE — Code Documentation (Signed)
Intubated at 25 at the lip

## 2020-02-18 NOTE — Progress Notes (Signed)
eLink Physician-Brief Progress Note Patient Name: Maurice Nguyen DOB: April 22, 1966 MRN: [id number]   Date of Service  02/06/2020  HPI/Events of Note  Agitation - Request for sedation.   eICU Interventions  Plan: 1. Fentanyl IV infusion. Titrate to RASS = 0 to -1.      Intervention Category Major Interventions: Delirium, psychosis, severe agitation - evaluation and management  Adelise Buswell Eugene 02/17/2020, 11:52 PM

## 2020-02-18 NOTE — Progress Notes (Signed)
Notified bedside nurse of need to draw lactic acid and blood cultures and give antibiotics. 

## 2020-02-19 ENCOUNTER — Inpatient Hospital Stay (HOSPITAL_COMMUNITY): Payer: Medicare HMO

## 2020-02-19 ENCOUNTER — Encounter (HOSPITAL_COMMUNITY): Payer: Medicare HMO

## 2020-02-19 ENCOUNTER — Encounter (HOSPITAL_COMMUNITY): Payer: Self-pay | Admitting: Cardiology

## 2020-02-19 DIAGNOSIS — N179 Acute kidney failure, unspecified: Secondary | ICD-10-CM

## 2020-02-19 LAB — POCT I-STAT 7, (LYTES, BLD GAS, ICA,H+H)
Acid-Base Excess: 1 mmol/L (ref 0.0–2.0)
Bicarbonate: 27.4 mmol/L (ref 20.0–28.0)
Calcium, Ion: 1.06 mmol/L — ABNORMAL LOW (ref 1.15–1.40)
HCT: 47 % (ref 39.0–52.0)
Hemoglobin: 16 g/dL (ref 13.0–17.0)
O2 Saturation: 97 %
Patient temperature: 97.9
Potassium: 4.4 mmol/L (ref 3.5–5.1)
Sodium: 140 mmol/L (ref 135–145)
TCO2: 29 mmol/L (ref 22–32)
pCO2 arterial: 46.6 mmHg (ref 32.0–48.0)
pH, Arterial: 7.375 (ref 7.350–7.450)
pO2, Arterial: 97 mmHg (ref 83.0–108.0)

## 2020-02-19 LAB — URINALYSIS, ROUTINE W REFLEX MICROSCOPIC
Bilirubin Urine: NEGATIVE
Glucose, UA: >=500 mg/dL — AB
Ketones, ur: NEGATIVE mg/dL
Nitrite: NEGATIVE
Protein, ur: 100 mg/dL — AB
RBC / HPF: >50 RBC/hpf — ABNORMAL HIGH (ref 0–5)
Specific Gravity, Urine: 1.017 (ref 1.005–1.030)
WBC, UA: >50 WBC/hpf — ABNORMAL HIGH (ref 0–5)
pH: 5 (ref 5.0–8.0)

## 2020-02-19 LAB — HEPARIN LEVEL (UNFRACTIONATED)
Heparin Unfractionated: 0.12 IU/mL — ABNORMAL LOW (ref 0.30–0.70)
Heparin Unfractionated: 0.44 IU/mL (ref 0.30–0.70)
Heparin Unfractionated: 0.46 IU/mL (ref 0.30–0.70)

## 2020-02-19 LAB — LACTIC ACID, PLASMA: Lactic Acid, Venous: 4.7 mmol/L (ref 0.5–1.9)

## 2020-02-19 LAB — BASIC METABOLIC PANEL
Anion gap: 15 (ref 5–15)
Anion gap: 16 — ABNORMAL HIGH (ref 5–15)
BUN: 34 mg/dL — ABNORMAL HIGH (ref 6–20)
BUN: 38 mg/dL — ABNORMAL HIGH (ref 6–20)
CO2: 19 mmol/L — ABNORMAL LOW (ref 22–32)
CO2: 19 mmol/L — ABNORMAL LOW (ref 22–32)
Calcium: 8 mg/dL — ABNORMAL LOW (ref 8.9–10.3)
Calcium: 8.3 mg/dL — ABNORMAL LOW (ref 8.9–10.3)
Chloride: 103 mmol/L (ref 98–111)
Chloride: 103 mmol/L (ref 98–111)
Creatinine, Ser: 2.48 mg/dL — ABNORMAL HIGH (ref 0.61–1.24)
Creatinine, Ser: 2.74 mg/dL — ABNORMAL HIGH (ref 0.61–1.24)
GFR calc Af Amer: 29 mL/min — ABNORMAL LOW (ref >60)
GFR calc Af Amer: 32 mL/min — ABNORMAL LOW (ref >60)
GFR calc non Af Amer: 25 mL/min — ABNORMAL LOW (ref >60)
GFR calc non Af Amer: 28 mL/min — ABNORMAL LOW (ref >60)
Glucose, Bld: 211 mg/dL — ABNORMAL HIGH (ref 70–99)
Glucose, Bld: 229 mg/dL — ABNORMAL HIGH (ref 70–99)
Potassium: 4.2 mmol/L (ref 3.5–5.1)
Potassium: 4.5 mmol/L (ref 3.5–5.1)
Sodium: 137 mmol/L (ref 135–145)
Sodium: 138 mmol/L (ref 135–145)

## 2020-02-19 LAB — GLUCOSE, CAPILLARY
Glucose-Capillary: 202 mg/dL — ABNORMAL HIGH (ref 70–99)
Glucose-Capillary: 192 mg/dL — ABNORMAL HIGH (ref 70–99)
Glucose-Capillary: 155 mg/dL — ABNORMAL HIGH (ref 70–99)
Glucose-Capillary: 156 mg/dL — ABNORMAL HIGH (ref 70–99)
Glucose-Capillary: 142 mg/dL — ABNORMAL HIGH (ref 70–99)
Glucose-Capillary: 174 mg/dL — ABNORMAL HIGH (ref 70–99)

## 2020-02-19 LAB — CBC
HCT: 46.1 % (ref 39.0–52.0)
Hemoglobin: 14.9 g/dL (ref 13.0–17.0)
MCH: 31.3 pg (ref 26.0–34.0)
MCHC: 32.3 g/dL (ref 30.0–36.0)
MCV: 96.8 fL (ref 80.0–100.0)
Platelets: 176 10*3/uL (ref 150–400)
RBC: 4.76 MIL/uL (ref 4.22–5.81)
RDW: 13.9 % (ref 11.5–15.5)
WBC: 17.8 10*3/uL — ABNORMAL HIGH (ref 4.0–10.5)
nRBC: 0 % (ref 0.0–0.2)

## 2020-02-19 LAB — COOXEMETRY PANEL
Carboxyhemoglobin: 0.5 % (ref 0.5–1.5)
Methemoglobin: 0.6 % (ref 0.0–1.5)
O2 Saturation: 60.4 %
Total hemoglobin: 14.6 g/dL (ref 12.0–16.0)

## 2020-02-19 LAB — RAPID URINE DRUG SCREEN, HOSP PERFORMED
Amphetamines: NOT DETECTED
Barbiturates: NOT DETECTED
Benzodiazepines: NOT DETECTED
Cocaine: POSITIVE — AB
Opiates: NOT DETECTED
Tetrahydrocannabinol: NOT DETECTED

## 2020-02-19 LAB — MRSA PCR SCREENING: MRSA by PCR: NEGATIVE

## 2020-02-19 LAB — MAGNESIUM: Magnesium: 1.7 mg/dL (ref 1.7–2.4)

## 2020-02-19 LAB — APTT: aPTT: 88 seconds — ABNORMAL HIGH (ref 24–36)

## 2020-02-19 LAB — PHOSPHORUS: Phosphorus: 4.1 mg/dL (ref 2.5–4.6)

## 2020-02-19 MED ORDER — PROSOURCE TF PO LIQD
90.0000 mL | Freq: Three times a day (TID) | ORAL | Status: DC
  Administered 2020-02-19 – 2020-02-25 (×18): 90 mL
  Filled 2020-02-19 (×18): qty 90

## 2020-02-19 MED ORDER — DIGOXIN 0.25 MG/ML IJ SOLN
0.1250 mg | Freq: Once | INTRAMUSCULAR | Status: AC | PRN
  Administered 2020-02-19: 0.125 mg via INTRAVENOUS
  Filled 2020-02-19: qty 2

## 2020-02-19 MED ORDER — SODIUM CHLORIDE 0.9 % IV SOLN
2.0000 g | Freq: Two times a day (BID) | INTRAVENOUS | Status: DC
  Administered 2020-02-19 – 2020-02-22 (×7): 2 g via INTRAVENOUS
  Filled 2020-02-19 (×9): qty 2

## 2020-02-19 MED ORDER — POLYETHYLENE GLYCOL 3350 17 G PO PACK
17.0000 g | PACK | Freq: Every day | ORAL | Status: DC
  Administered 2020-02-21 – 2020-02-27 (×5): 17 g
  Filled 2020-02-19 (×5): qty 1

## 2020-02-19 MED ORDER — VANCOMYCIN HCL 1750 MG/350ML IV SOLN
1750.0000 mg | INTRAVENOUS | Status: DC
  Administered 2020-02-19: 1750 mg via INTRAVENOUS
  Filled 2020-02-19: qty 350

## 2020-02-19 MED ORDER — MILRINONE LACTATE IN DEXTROSE 20-5 MG/100ML-% IV SOLN
0.1250 ug/kg/min | INTRAVENOUS | Status: DC
  Administered 2020-02-19 – 2020-02-23 (×7): 0.125 ug/kg/min via INTRAVENOUS
  Filled 2020-02-19 (×6): qty 100

## 2020-02-19 MED ORDER — DOCUSATE SODIUM 50 MG/5ML PO LIQD: 100.0000 mg | Freq: Two times a day (BID) | ORAL | Status: DC | PRN

## 2020-02-19 MED ORDER — DEXMEDETOMIDINE HCL IN NACL 400 MCG/100ML IV SOLN
0.4000 ug/kg/h | INTRAVENOUS | Status: DC
  Administered 2020-02-19: 0.4 ug/kg/h via INTRAVENOUS
  Filled 2020-02-19: qty 100

## 2020-02-19 MED ORDER — VITAL AF 1.2 CAL PO LIQD
1000.0000 mL | ORAL | Status: DC
  Administered 2020-02-19 – 2020-02-24 (×3): 1000 mL

## 2020-02-19 MED ORDER — INSULIN ASPART 100 UNIT/ML ~~LOC~~ SOLN
0.0000 [IU] | SUBCUTANEOUS | Status: DC
  Administered 2020-02-19 (×2): 3 [IU] via SUBCUTANEOUS
  Administered 2020-02-19: 2 [IU] via SUBCUTANEOUS
  Administered 2020-02-20: 8 [IU] via SUBCUTANEOUS
  Administered 2020-02-20: 3 [IU] via SUBCUTANEOUS
  Administered 2020-02-20: 2 [IU] via SUBCUTANEOUS
  Administered 2020-02-20: 5 [IU] via SUBCUTANEOUS
  Administered 2020-02-20: 3 [IU] via SUBCUTANEOUS
  Administered 2020-02-20: 5 [IU] via SUBCUTANEOUS
  Administered 2020-02-21 (×4): 3 [IU] via SUBCUTANEOUS
  Administered 2020-02-21 – 2020-02-22 (×2): 2 [IU] via SUBCUTANEOUS
  Administered 2020-02-22 (×4): 3 [IU] via SUBCUTANEOUS
  Administered 2020-02-22 – 2020-02-24 (×9): 2 [IU] via SUBCUTANEOUS
  Administered 2020-02-24: 3 [IU] via SUBCUTANEOUS
  Administered 2020-02-24 – 2020-02-25 (×6): 2 [IU] via SUBCUTANEOUS
  Administered 2020-02-25 – 2020-02-26 (×6): 3 [IU] via SUBCUTANEOUS
  Administered 2020-02-26: 2 [IU] via SUBCUTANEOUS
  Administered 2020-02-27: 3 [IU] via SUBCUTANEOUS
  Administered 2020-02-27: 2 [IU] via SUBCUTANEOUS
  Administered 2020-02-27 (×3): 3 [IU] via SUBCUTANEOUS

## 2020-02-19 MED ORDER — POLYETHYLENE GLYCOL 3350 17 G PO PACK: 17.0000 g | PACK | Freq: Every day | ORAL | Status: DC | PRN

## 2020-02-19 MED ORDER — DOCUSATE SODIUM 50 MG/5ML PO LIQD
100.0000 mg | Freq: Two times a day (BID) | ORAL | Status: DC
  Administered 2020-02-19 – 2020-02-27 (×13): 100 mg
  Filled 2020-02-19 (×15): qty 10

## 2020-02-19 MED ORDER — MAGNESIUM SULFATE 2 GM/50ML IV SOLN
2.0000 g | Freq: Once | INTRAVENOUS | Status: AC
  Administered 2020-02-19: 2 g via INTRAVENOUS
  Filled 2020-02-19: qty 50

## 2020-02-19 NOTE — Progress Notes (Signed)
Initial Nutrition Assessment  DOCUMENTATION CODES:   Morbid obesity  INTERVENTION:   Tube feeding:  -Vital AF 1.2 @ 20 ml/hr via OG -Increase by 10 ml Q4 hours to goal rate of 65 ml/hr (1560 ml) -90 ml ProSource TID  At goal rate TF provides: 2112 kcals, 183 grams protein, 1265 ml free water.   NUTRITION DIAGNOSIS:   Increased nutrient needs related to acute illness as evidenced by estimated needs.  GOAL:   Patient will meet greater than or equal to 90% of their needs  MONITOR:   Weight trends, Diet advancement, Vent status, Skin, TF tolerance, Labs, I & O's  REASON FOR ASSESSMENT:   Ventilator, Consult Enteral/tube feeding initiation and management  ASSESSMENT:   Patient with PMH significant for HTN, PAD, CHF, and DM. Presents this admission after cardiac arrest.   Pt discussed during ICU rounds and with RN.   Started on TTM36. Continuous EEG. Pressor requirement decreasing. Okay to start feeding per CCM.   Per wife, pt had great appetite PTA. Consumed 2-3 meals daily with snacks and likes [place] products. States pt is seen by wound clinic for chronic L foot infection.   Wife unsure of UBW. Denies recent weight loss. Records shows decline in weight. Unable to determine dry wt loss vs fluid fluctuation given CHF hx. Utilize EDW of 168.7 kg for now.   Patient is currently intubated on ventilator support MV: 11.1 L/min Temp (24hrs), Avg:97.5 F (36.4 C), Min:96.4 F (35.8 C), Max:99 F (37.2 C)  I/O: +2,550 ml since admit UOP: 80 ml x 24 hrs   Drips: milrinone Medications: colace, SS novolog, miralax Labs: CBG 156-312  NUTRITION - FOCUSED PHYSICAL EXAM:    Most Recent Value  Orbital Region No depletion  Upper Arm Region No depletion  Thoracic and Lumbar Region Unable to assess  Buccal Region No depletion  Temple Region No depletion  Clavicle Bone Region No depletion  Clavicle and Acromion Bone Region No depletion  Scapular Bone Region Unable to assess   Dorsal Hand No depletion  Patellar Region No depletion  Anterior Thigh Region Unable to assess  Posterior Calf Region No depletion  Edema (RD Assessment) Moderate  Hair Reviewed  Eyes Reviewed  Mouth Reviewed  Skin Reviewed  Nails Reviewed     Diet Order:   Diet Order    None      EDUCATION NEEDS:   Not appropriate for education at this time  Skin:  Skin Assessment: Skin Integrity Issues: Skin Integrity Issues:: Diabetic Ulcer Diabetic Ulcer: L foot- chronic  Last BM:  7/26  Height:   Ht Readings from Last 1 Encounters:  21-Feb-2020 6\' 2"  (1.88 m)    Weight:   Wt Readings from Last 1 Encounters:  02/19/20 (!) 168.7 kg    Ideal Body Weight:  86.4 kg  BMI:  Body mass index is 47.75 kg/m.  Estimated Nutritional Needs:   Kcal:  02/21/20 kcal  Protein:  173-216 grams  Fluid:  >/= 1.8 L/day   [id number] RD, LDN Clinical Nutrition Pager listed in AMION

## 2020-02-19 NOTE — Progress Notes (Signed)
Progress Note  Patient Name: Maurice Nguyen Date of Encounter: 02/19/2020  [facility] Cardiologist: Mertie Moores, MD    Subjective   54 year old gentleman with a history of morbid obesity, diabetes mellitus was admitted yesterday with cardiac arrest.  UDS is positive for cocaine.  Is no longer having bradycardia.  Is in atrial flutter with rapid V response BP is still marginal   CVP is  9-10 Urine output is low.   Received IV NS bolus overnight   CXR shows massive cardiomegaly  Will try a dose of lasix      Inpatient Medications    Scheduled Meds: . chlorhexidine gluconate (MEDLINE KIT)  15 mL Mouth Rinse BID  . Chlorhexidine Gluconate Cloth  6 each Topical Daily  . docusate  100 mg Oral BID  . insulin aspart  0-6 Units Subcutaneous Q4H  . mouth rinse  15 mL Mouth Rinse 10 times per day  . pantoprazole sodium  40 mg Per Tube Daily  . polyethylene glycol  17 g Oral Daily  . sodium chloride flush  10-40 mL Intracatheter Q12H  . sodium chloride flush  3 mL Intravenous Q12H   Continuous Infusions: . sodium chloride    . sodium chloride 10 mL/hr at 02/19/20 0800  . sodium chloride    . ceFEPime (MAXIPIME) IV Stopped (02/19/20 0444)  . fentaNYL infusion INTRAVENOUS 150 mcg/hr (02/19/20 0800)  . heparin 1,300 Units/hr (02/19/20 0800)  . norepinephrine (LEVOPHED) Adult infusion Stopped (02/19/20 0413)  . vancomycin Stopped (02/19/20 0047)   PRN Meds: sodium chloride, docusate sodium, polyethylene glycol, sodium chloride flush, sodium chloride flush   Vital Signs    Vitals:   02/19/20 0645 02/19/20 0700 02/19/20 0800 02/19/20 0814  BP:  (!) 129/91 (!) 138/104   Pulse:   64 (!) 130  Resp: _0 Temp:   (!) 96.8 F (36 C)   TempSrc:   Bladder   SpO2: 100% 99% 100% 100%  Weight:      Height:        Intake/Output Summary (Last 24 hours) at 02/19/2020 0851 Last data filed at 02/19/2020 0800 Gross per 24 hour  Intake 2974.56 ml  Output 580 ml    Net 2394.56 ml   Last 3 Weights 02/19/2020 02/03/2020 12/05/2019  Weight (lbs) 371 lb 14.7 oz 408 lb 1.1 oz 408 lb  Weight (kg) 168.7 kg 185.1 kg 185.068 kg      Telemetry    Atrial flutter with RVR of 115 - Personally Reviewed  ECG     - Personally Reviewed  Physical Exam   GEN:  middle age male,  Morbidly obese.    Neck: No JVD Cardiac: Irreg. Tachy .  Respiratory: Clear to auscultation bilaterally. GI: Soft, nontender, non-distended  MS: No edema; No deformity. Neuro:   on the vent , unresponsive  Psych:  Unable to assess.   Labs    High Sensitivity Troponin:   Recent Labs  Lab 02/08/2020 1014 01/30/2020 1627  TROPONINIHS 169* 7,310*      Chemistry Recent Labs  Lab 01/31/2020 1014 02/04/2020 1022 02/06/2020 1627 02/16/2020 1627 02/19/2020 2341 02/19/20 0412 02/19/20 0414  NA 136   < > 138   < > 137 138 140  K 5.5*   < > 3.7   < > 4.2 4.5 4.4  CL 101   < > 101  --  103 103  --   CO2 17*   < > 23  --  19* 19*  --   GLUCOSE 306*   < > 305*  --  229* 211*  --   BUN 25*   < > 35*  --  34* 38*  --   CREATININE 1.61*   < > 2.08*  --  2.48* 2.74*  --   CALCIUM 8.2*   < > 8.3*  --  8.0* 8.3*  --   PROT 6.1*  --   --   --   --   --   --   ALBUMIN 2.9*  --   --   --   --   --   --   AST 785*  --   --   --   --   --   --   ALT 641*  --   --   --   --   --   --   ALKPHOS 124  --   --   --   --   --   --   BILITOT 1.7*  --   --   --   --   --   --   GFRNONAA 47*   < > 35*  --  28* 25*  --   GFRAA 55*   < > 40*  --  32* 29*  --   ANIONGAP 18*   < > 14  --  15 16*  --    < > = values in this interval not displayed.     Hematology Recent Labs  Lab 01/28/2020 1014 02/19/2020 1022 02/14/2020 1741 02/19/20 0412 02/19/20 0414  WBC 26.4*  --  18.0* 17.8*  --   RBC 4.51  --  4.59 4.76  --   HGB 14.5   < > 14.4 14.9 16.0  HCT 46.8   < > 44.0 46.1 47.0  MCV 103.8*  --  95.9 96.8  --   MCH 32.2  --  31.4 31.3  --   MCHC 31.0  --  32.7 32.3  --   RDW 14.0  --  13.7 13.9  --   PLT  221  --  214 176  --    < > = values in this interval not displayed.    BNP Recent Labs  Lab 02/05/2020 1030  BNP 158.1*     DDimer No results for input(s): DDIMER in the last 168 hours.   Radiology    CT Head Wo Contrast  Result Date: 01/30/2020 CLINICAL DATA:  Cardiac arrest with altered mental status EXAM: CT HEAD WITHOUT CONTRAST TECHNIQUE: Contiguous axial images were obtained from the base of the skull through the vertex without intravenous contrast. COMPARISON:  Dec 11, 2019 FINDINGS: Brain: Ventricles are normal in size and configuration. There is mild frontal and parietal lobe atrophy, stable. There is no intracranial mass, hemorrhage, extra-axial fluid collection, or midline shift. There is evidence of a prior infarct in the inferior most aspect of the left occipital lobe, stable. There is slight small vessel disease in the centra semiovale bilaterally. No acute infarct is appreciable. Vascular: No hyperdense vessel. There are foci of calcification in each carotid siphon region. Skull: The bony calvarium appears intact. Sinuses/Orbits: Patient is intubated. There is mucosal thickening in multiple ethmoid air cells. There is mucosal thickening in the anterior sphenoid sinus. There is opacification in posterior nares regions, likely a function of the intubation. Orbits appear symmetric bilaterally. Other: Mastoid air cells are clear. IMPRESSION: Mild frontal and parietal atrophy, stable. Prior infarct inferior  left occipital lobe, stable. Mild periventricular small vessel disease, likewise stable. No acute infarct demonstrable. No mass or hemorrhage. Foci of arterial vascular calcification noted. Foci of paranasal sinus disease noted. Patient intubated. Electronically Signed   By: Lowella Grip III M.D.   On: 02/12/2020 11:15   CARDIAC CATHETERIZATION  Result Date: 02/01/2020 Successful placement of a temporary transvenous pacemaker into the RV.  DG Chest Port 1 View  Result Date:  02/19/2020 CLINICAL DATA:  Respiratory failure. EXAM: PORTABLE CHEST 1 VIEW COMPARISON:  02/09/2020. FINDINGS: Endotracheal tube, NG tube, left IJ line stable position. Cardiomegaly. Pulmonary venous congestion. Low lung volumes. Mild bilateral interstitial prominence suggesting mild CHF. Mild right base alveolar infiltrates/edema noted on today's exam. No pneumothorax. IMPRESSION: 1.  Lines and tubes in stable position. 2. Cardiomegaly with pulmonary venous congestion. Low lung volumes with mild bilateral interstitial prominence suggesting mild CHF noted on today's exam. 3.  Mild right base alveolar infiltrate/edema noted on today's exam. Electronically Signed   By: Marcello Moores  Register   On: 02/19/2020 06:41   DG CHEST PORT 1 VIEW  Result Date: 02/05/2020 CLINICAL DATA:  Status post central line placement today. EXAM: PORTABLE CHEST 1 VIEW COMPARISON:  Single-view of the chest earlier today. FINDINGS: The patient has a new left IJ approach central venous catheter with the tip projecting in the mid superior vena cava. No pneumothorax. NG tube and endotracheal tube remain in place in good position. Lung volumes are low but the lungs appear clear. IMPRESSION: New left IJ approach central venous catheter tip projects in the mid superior vena cava. No pneumothorax. Endotracheal tube and NG tube in good position. Lungs appear clear. Electronically Signed   By: Inge Rise M.D.   On: 02/07/2020 15:46   DG CHEST PORT 1 VIEW  Result Date: 02/13/2020 CLINICAL DATA:  ETT advancement EXAM: PORTABLE CHEST 1 VIEW COMPARISON:  02/04/2020 at 1022 hours FINDINGS: Interval advancement of endotracheal tube which now terminates 2.5 cm above the carina. Enteric tube courses below the diaphragm with distal tip beyond the inferior margin of the film. Low lung volumes. Stable cardiomegaly. Pulmonary vascular congestion without overt edema. No lobar consolidation. Left costophrenic angle was excluded from the field of view. No  pneumothorax. IMPRESSION: 1. Interval advancement of endotracheal tube which now terminates 2.5 cm above the carina. 2. Otherwise, stable chest. Electronically Signed   By: Davina Poke D.O.   On: 02/08/2020 12:15   DG Chest Portable 1 View  Result Date: 02/12/2020 CLINICAL DATA:  Hypoxia EXAM: PORTABLE CHEST 1 VIEW COMPARISON:  October 08, 2019 FINDINGS: Endotracheal tube tip is at the T1 level, 8.2 cm above the carina. Nasogastric tube tip and side port in stomach. No pneumothorax. There is cardiomegaly with pulmonary venous hypertension. There is no evident edema or airspace opacity. No adenopathy appreciable by radiography. IMPRESSION: Tube positions as described without pneumothorax. It may be prudent to consider advancing endotracheal tube 4-5 cm. There is cardiomegaly with pulmonary vascular congestion. No edema or airspace opacity. Electronically Signed   By: Lowella Grip III M.D.   On: 02/11/2020 10:40   EEG adult  Result Date: 02/09/2020 Lora Havens, MD     01/27/2020  5:09 PM Patient Name: Maurice Nguyen MRN: [id number] Epilepsy Attending: Lora Havens Referring Physician/Provider: Dr Peter Martinique Date: 02/08/2020 Duration: 21.02 mins Patient history: 54yo M s/p cardiac arrest on TTM. EEG to evaluate for seizure. Level of alertness:  comatose AEDs during EEG study: None Technical aspects: This EEG  study was done with scalp electrodes positioned according to the 10-20 International system of electrode placement. Electrical activity was acquired at a sampling rate of _0  and reviewed with a high frequency filter of _1  and a low frequency filter of _2 . EEG data were recorded continuously and digitally stored. Description:  EEG showed continuous generalized suppression. EEG was not reactive to tactile stimuli. In the beginning of recording, burst of generalized polyspikes were also noted. Hyperventilation and photic stimulation were not performed.   ABNORMALITY - EEG  suppression, generalized - Polyspikes, generalized IMPRESSION: This study showed evidence of generalized epileptogenicity as well as profound diffuse encephalopathy, nonspecific etiology but likely related to anoxic/hypoxic brain injury Lora Havens   ECHOCARDIOGRAM COMPLETE  Result Date: 02/05/2020    ECHOCARDIOGRAM REPORT   Patient Name:   Maurice Nguyen Date of Exam: 02/09/2020 Medical Rec #:  [id number]          Height:       74.0 in Accession #:    [id number]         Weight:       408.1 lb Date of Birth:  [birth year]           BSA:          2.943 m Patient Age:    50 years           BP:           84/61 mmHg Patient Gender: M                  HR:           109 bpm. Exam Location:  Inpatient Procedure: 2D Echo Indications:    cardiac arrest  History:        Patient has prior history of Echocardiogram examinations, most                 recent 10/09/2019. Abnormal ECG, Arrythmias:Atrial Fibrillation;                 Risk Factors:Dyslipidemia, Hypertension, Diabetes and Sleep                 Apnea.  Sonographer:    Johny Chess Referring Phys: [id number] MATTHEW J TRIFAN  Sonographer Comments: Echo performed with patient supine and on artificial respirator. IMPRESSIONS  1. Left ventricular ejection fraction, by estimation, is 20 to 25%. The left ventricle has severely decreased function. The left ventricle demonstrates global hypokinesis. There is mild concentric left ventricular hypertrophy. Left ventricular diastolic  function could not be evaluated.  2. Right ventricular systolic function is moderately reduced. The right ventricular size is moderately enlarged.  3. The mitral valve is grossly normal. No evidence of mitral valve regurgitation. No evidence of mitral stenosis.  4. The aortic valve is tricuspid. Aortic valve regurgitation is not visualized. No aortic stenosis is present.  5. Aortic dilatation noted. There is mild dilatation of the aortic root measuring 41 mm. Comparison(s): Changes from  prior study are noted. EF is now 20-25% with global HK. FINDINGS  Left Ventricle: Left ventricular ejection fraction, by estimation, is 20 to 25%. The left ventricle has severely decreased function. The left ventricle demonstrates global hypokinesis. The left ventricular internal cavity size was normal in size. There is mild concentric left ventricular hypertrophy. Left ventricular diastolic function could not be evaluated due to atrial fibrillation. Left ventricular diastolic function could not be evaluated. Right Ventricle: The right ventricular size is moderately enlarged. No increase in  right ventricular wall thickness. Right ventricular systolic function is moderately reduced. Left Atrium: Left atrial size was normal in size. Right Atrium: Right atrial size was normal in size. Pericardium: Trivial pericardial effusion is present. Mitral Valve: The mitral valve is grossly normal. Mild mitral annular calcification. No evidence of mitral valve regurgitation. No evidence of mitral valve stenosis. Tricuspid Valve: The tricuspid valve is grossly normal. Tricuspid valve regurgitation is not demonstrated. No evidence of tricuspid stenosis. Aortic Valve: The aortic valve is tricuspid. Aortic valve regurgitation is not visualized. No aortic stenosis is present. Pulmonic Valve: The pulmonic valve was grossly normal. Pulmonic valve regurgitation is not visualized. No evidence of pulmonic stenosis. Aorta: Aortic dilatation noted. There is mild dilatation of the aortic root measuring 41 mm. Venous: IVC assessment for right atrial pressure unable to be performed due to mechanical ventilation. IAS/Shunts: The atrial septum is grossly normal. Additional Comments: A venous catheter is visualized in the inferior vena cava, right atrium and right ventricle.  LEFT VENTRICLE PLAX 2D LVIDd:         4.70 cm LVIDs:         4.00 cm LV PW:         1.40 cm LV IVS:        1.20 cm LVOT diam:     2.60 cm LVOT Area:     5.31 cm  IVC IVC  diam: 2.20 cm LEFT ATRIUM              Index       RIGHT ATRIUM           Index LA diam:        4.10 cm  1.39 cm/m  RA Area:     16.60 cm LA Vol (A2C):   100.0 ml 33.98 ml/m RA Volume:   41.20 ml  14.00 ml/m LA Vol (A4C):   65.2 ml  22.15 ml/m LA Biplane Vol: 84.3 ml  28.65 ml/m   AORTA Ao Root diam: 4.10 cm Ao Asc diam:  3.60 cm  SHUNTS Systemic Diam: 2.60 cm Eleonore Chiquito MD Electronically signed by Eleonore Chiquito MD Signature Date/Time: 02/11/2020/6:34:09 PM    Final     Cardiac Studies     Patient Profile     54 y.o. male with acute on chronic combined CHF admitted after cardiac arrest.    Assessment & Plan    1.   Cardiac arrest:   In the setting of cocaine abuse EF is acutely depressed .  Is now 20% compared to 35% in March, 2021 On the vent,   Cooling scheduled to end today EEG in progress  2.   acute on chronic combined systolic and diastolic congestive heart failure: The patient was noted to have an EF of 30 to 35% in March, 2021.  Echocardiogram from yesterday reveals EF of 20 to 25%.  There is global hypokinesis.  We were unable to evaluate diastolic function.  The right ventricle is moderately enlarged.  There is mild dilatation of the aortic root at 41 mm.  Will check Co-ox. He may benefit from milrinone - dosing may be difficulty given his worsening renal function.  Will start at 0.125 mcg / kg / min    3.   Respiratory failure:   Appreciate PCCM's assistance with this .         For questions or updates, please contact [place] Please consult [email or web address] for contact info under        Signed, Arnette Norris  Blayn Whetsell, MD  02/19/2020, 8:51 AM

## 2020-02-19 NOTE — Progress Notes (Addendum)
NAME:  Maurice Nguyen, MRN:  [id number], DOB:  [birth year], LOS: 1 ADMISSION DATE:  02/03/2020, CONSULTATION DATE:  02/14/2020 REFERRING MD:  Langston Masker, CHIEF COMPLAINT:  Cardiac arrest  Brief History   80KLK s/p asystolic cardiac arrest. 91PHX downtime.   History of present illness   31yom with NICM, HFrEF, afib, HTN, T2DM who presented via EMS s/p cardiac arrest. Wife notes that she woke up around 0700 at which time her husband was ok. They got up and ate some breakfast and when back to sleep. When she woke back up around 0730, she noted that he had agonal breathing and was non-responsive. EMS was called and pt was noted to have a pulse at that time. Shortly thereafter, he developed asystole, required 8 rounds of cpr. He transitioned into vfib arrest which responded appropriately to one shock. On arrival to the ED, he was noted to be hypothermic with temp of 83.32F. Labs showing trop of 167, K 5.4. Head CT negative for acute process.   Past Medical History  NICM, HFrEF (30-35%), afib, HTN, PAD, T2DM  Significant Hospital Events   7/26 admission  Consults:  Cardiology  Procedures:  7/26 ETT  Significant Diagnostic Tests:  7/26 CXR>>pulm vascular congestion 7/26 head CT>>no acute findings 7/26 Echo>> EF 20-25%, LV with severely decreased function/ global hypokinesis LV hypertrophy, RV function is moderately reduced, RV is moderately enlarged Aortic Dilation 55m Micro Data:  7/26 blood cultures>> 7/26 urine culture>> 7/26 resp culture>>  Antimicrobials:  7/26 vancomycin 7/26 cefepime 7/26 flagyl  Interim history/subjective:  Decreased UO overnight, CVP 9-11 Tachy  + 2300 cc's Creatinine up to 2.74 WBC 17.8 Last lactate 4.7 ( 7/26 2300) CXR 7/27>> Cardiomegaly with pulmonary venous congestion. Low lung volumes with mild bilateral interstitial prominence suggesting mild CHF noted on today's exam. Mild right base alveolar infiltrate/edema noted on today's exam. UDS is  positive for Cocaine 7/26  Objective   Blood pressure (!) 129/91, pulse (!) 130, temperature (!) 96.6 F (35.9 C), temperature source Bladder, resp. rate 16, height '6\' 2"'  (1.88 m), weight (!) 168.7 kg, SpO2 100 %. CVP:  [9 mmHg-11 mmHg] 10 mmHg  Vent Mode: PRVC FiO2 (%):  [50 %-100 %] 50 % Set Rate:  [16 bmp] 16 bmp Vt Set:  [650 mL] 650 mL PEEP:  [5 cmH20-8 cmH20] 8 cmH20 Plateau Pressure:  [16 cmH20-26 cmH20] 17 cmH20   Intake/Output Summary (Last 24 hours) at 02/19/2020 0818 Last data filed at 02/19/2020 0700 Gross per 24 hour  Intake 2936.54 ml  Output 580 ml  Net 2356.54 ml   Filed Weights   02/17/2020 1045 02/19/20 0400  Weight: (!) 185.1 kg (!) 168.7 kg    Examination: General: critically and chronically ill appearing obese male in NAD HENT: NCAT, thick neck, ETT is secure and intact Lungs: Bilateral chest excursion, crackles noted bilaterally Cardiovascular: tachycardic rate, regular rhythm, extremities cool to touch. Chronic venous stasis changes bilaterally Abdomen: obese, bs active, Body mass index is 47.75 kg/m. Extremities: no significant LE edema. Boot on left foot. Neuro: non-responsive. Sluggish pupillary response. Does not withdraw to pain. + gag and cough, overbreathing vent Skin: chronic venous insufficiency changes on LE. No rash.   Resolved Hospital Problem list   n/a  Assessment & Plan:   Asystolic cardiac arrest/Vfib cardiac arrest. ROSC achieved around 337m. Unclear cause at this time. Possibly precipitated by respiratory arrest as his wife heard agonal breathing prior to losing pulse. Primary concerns would be respiratory failure vs arrhythmia vs  PE vs MI vs electrolyte derangement.  NICM with HFrEF. Last echo in March 2021 showing EF of 30-35%. Heart cath 09/2019 showed mild non-obstructive disease except for severe disease in a small PL branch. Paroxysmal atrial fibrillation. CHADSVASc=5. Anticoagulated on xarelto.>> Now on heparin Shock--cardiogenic  vs septic--favor cardiogenic. Levo is off EF 20-25% UDS + for Cocaine Plan TTM protocol to 36 degrees Vasopressor support with levo to be titrated to maintain MAP>65, currently off Echocardiogram as above Appreciate Cardiology assist Hold home antihypertensives for now Heparin gtt Trend Co-Ox per cards Consider milrinone as renal function allows   Acute encephalopathy. Primary concern right now is hypoxic injury. Low suspicion for anoxic as pulse was lost while being evaluated by EMS. No acute findings on head CT on admission.  Plan:  Fentanyl gtt,  Continuous EEG,  Further neuroprognostication to follow after 72h post rewarming.  Acute respiratory failure requiring mechanical ventilation. Unclear if this may have precipitated the cardiac arrest. CXR post intubation showed need to progress tube. Repeat CXR with pulm vasc congestion and infiltrate. ABG looks good Plan Trend CXR Trend  ABG  Titrate oxygen and PEEP for sats > 92% Full support while being cooled Consider Lasix 40 mg per cards Continue current antibiotic regimen to cover infiltrate  Sepsis criteria met.  Pt with history of chronic foot infection. Wife denies recent s/s of infection. Plan:  follow blood/urine cultures.  Broad spectrum abx coverage with vanc, cefepime and flagyl.  Narrow pending culture results.  Will trend lactate  Will hold off on fluids right now in the setting of HFrEF. Trend Co-Ox per cards  T2DM.  CBG's 155- 202 Plan Q4h SSI with CBGs--  Will adjust SSI to moderate scale  Liver shock 2/2 cardiac arrest.  Plan Trend LFT's  Acute kidney injury likely related to hypoperfusion. Plan  Continue to monitor.  Pressor support to maintain perfusion. Monitor UO   Nutrition Plan Will start TF once patient is re-warmed later today 7/27 after 1:30 pm  Best practice:  Diet: NPO Pain/Anxiety/Delirium protocol (if indicated): n/a VAP protocol (if indicated): yes DVT prophylaxis: heparin  gtt GI prophylaxis: pepcid Glucose control: SSI Mobility: BR Code Status: Full Family Communication: updated Disposition: ICU  Labs   CBC: Recent Labs  Lab 01/26/2020 1014 02/20/2020 1022 02/08/2020 1126 01/26/2020 1313 02/12/2020 1741 02/19/20 0412 02/19/20 0414  WBC 26.4*  --   --   --  18.0* 17.8*  --   NEUTROABS 15.7*  --   --   --   --   --   --   HGB 14.5   < > 15.3 15.3 14.4 14.9 16.0  HCT 46.8   < > 45.0 45.0 44.0 46.1 47.0  MCV 103.8*  --   --   --  95.9 96.8  --   PLT 221  --   --   --  214 176  --    < > = values in this interval not displayed.    Basic Metabolic Panel: Recent Labs  Lab 02/17/2020 1014 01/25/2020 1014 02/04/2020 1022 02/11/2020 1126 02/07/2020 1313 02/23/2020 1627 01/26/2020 2341 02/19/20 0412 02/19/20 0414  NA 136   < > 138   < > 138 138 137 138 140  K 5.5*   < > 5.4*   < > 4.6 3.7 4.2 4.5 4.4  CL 101  --  101  --   --  101 103 103  --   CO2 17*  --   --   --   --  23 19* 19*  --   GLUCOSE 306*  --  312*  --   --  305* 229* 211*  --   BUN 25*  --  41*  --   --  35* 34* 38*  --   CREATININE 1.61*  --  1.30*  --   --  2.08* 2.48* 2.74*  --   CALCIUM 8.2*  --   --   --   --  8.3* 8.0* 8.3*  --   MG  --   --   --   --   --   --   --  1.7  --   PHOS  --   --   --   --   --   --   --  4.1  --    < > = values in this interval not displayed.   GFR: Estimated Creatinine Clearance: 49.7 mL/min (A) (by C-G formula based on SCr of 2.74 mg/dL (H)). Recent Labs  Lab 02/17/2020 1014 01/27/2020 1119 01/28/2020 1544 02/02/2020 1741 02/14/2020 2312 02/19/20 0412  WBC 26.4*  --   --  18.0*  --  17.8*  LATICACIDVEN  --  7.1* 2.9*  --  4.7*  --     Liver Function Tests: Recent Labs  Lab 02/11/2020 1014  AST 785*  ALT 641*  ALKPHOS 124  BILITOT 1.7*  PROT 6.1*  ALBUMIN 2.9*   No results for input(s): LIPASE, AMYLASE in the last 168 hours. No results for input(s): AMMONIA in the last 168 hours.  ABG    Component Value Date/Time   PHART 7.375 02/19/2020 0414    PCO2ART 46.6 02/19/2020 0414   PO2ART 97 02/19/2020 0414   HCO3 27.4 02/19/2020 0414   TCO2 29 02/19/2020 0414   ACIDBASEDEF 3.0 (H) 02/10/2020 1313   O2SAT 97.0 02/19/2020 0414     Coagulation Profile: Recent Labs  Lab 02/02/2020 1040  INR 1.4*    Cardiac Enzymes: No results for input(s): CKTOTAL, CKMB, CKMBINDEX, TROPONINI in the last 168 hours.  HbA1C: Hgb A1c MFr Bld  Date/Time Value Ref Range Status  01/30/2020 04:27 PM 8.1 (H) 4.8 - 5.6 % Final    Comment:    (NOTE) Pre diabetes:          5.7%-6.4%  Diabetes:              >6.4%  Glycemic control for   <7.0% adults with diabetes   10/08/2019 10:24 PM 11.2 (H) 4.8 - 5.6 % Final    Comment:    (NOTE) Pre diabetes:          5.7%-6.4% Diabetes:              >6.4% Glycemic control for   <7.0% adults with diabetes     CBG: Recent Labs  Lab 01/30/2020 1656 02/21/2020 2036 02/20/2020 2321 02/19/20 0418 02/19/20 0759  GLUCAP 282* 226* 202* 192* 155*    Past Medical History  He,  has a past medical history of A-fib ([place]), Anticoagulated, on Xarelto, Chads2Vas score 2 (10/02/2013), Back pain, chronic (07/13/2012), CHF (congestive heart failure) ([place]), Chronic ulcer of lower extremity ([place]), CKD (chronic kidney disease), stage II, Complication of anesthesia, Hypertension (07/13/2012), Irregular heart beat, Lumbar herniated disc, Morbid obesity ([place]), Obesity, morbid, BMI 50 or higher ([place]) (07/13/2012), 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   Surgical History    Past Surgical History:  Procedure Laterality Date  .  ABSCESS DRAINAGE    . APPLICATION OF A-CELL OF EXTREMITY Left 12/05/2019   Procedure: Application Of A-Cell Of Extremity;  Surgeon: Trula Slade, DPM;  Location: [facility];  Service: Podiatry;  Laterality: Left;  . COLONOSCOPY  08/22/2012   Procedure: COLONOSCOPY;  Surgeon: Lear Ng, MD;  Location: [facility]  END[facility]COPY;  Service: Endoscopy;  Laterality: N/A;  . CORONARY STENT PLACEMENT Left 10/11/2019   Procedure: left heart cath and coronary angiography;  Surgeon: Burnell Blanks, MD;  Location: [facility];  Service: Cardiovascular;  Laterality: Left;  . DENTAL SURGERY    . INCISION AND DRAINAGE OF WOUND Left 12/05/2019   Procedure: IRRIGATION AND DEBRIDEMENT LEFT FOOT  WOUND AND APPLICATION GRAFT;  Surgeon: Trula Slade, DPM;  Location: [facility];  Service: Podiatry;  Laterality: Left;  . LOWER EXTREMITY ANGIOGRAM Bilateral 10/11/2019   Procedure: LOWER EXTREMITY ANGIOGRAM;  Surgeon: Waynetta Sandy, MD;  Location: [facility];  Service: Vascular;  Laterality: Bilateral;  . TEMPORARY PACEMAKER N/A 02/23/2020   Procedure: TEMPORARY PACEMAKER;  Surgeon: Martinique, Peter M, MD;  Location: [facility];  Service: Cardiovascular;  Laterality: N/A;  . WOUND DEBRIDEMENT Left 10/10/2019   Procedure: DEBRIDEMENT WOUND;  Surgeon: Trula Slade, DPM;  Location: [place];  Service: Podiatry;  Laterality: Left;  . WOUND DEBRIDEMENT Left 10/15/2019   Procedure: DEBRIDEMENT WOUND, POSSIBLE WOUND VAC APPLICATION;  Surgeon: Trula Slade, DPM;  Location: [facility];  Service: Podiatry;  Laterality: Left;     Social History   reports that he has never smoked. He has never used smokeless tobacco. He reports that he does not drink alcohol and does not use drugs.   Family History   His family history is negative for Heart disease.   Allergies No Known Allergies   Home Medications  Prior to Admission medications   Medication Sig Start Date End Date Taking? Authorizing Provider  Accu-Chek FastClix Lancets MISC 1 each by Other route 3 (three) times daily. 11/14/19   [provider]  amoxicillin-clavulanate (AUGMENTIN) 875-125 MG tablet Take 1 tablet by mouth 2 (two) times daily. Patient not taking: Reported on 01/31/2020 10/30/19   Trula Slade, DPM  ampicillin (PRINCIPEN) 500 MG capsule Take  1 capsule (500 mg total) by mouth 4 (four) times daily. Patient not taking: Reported on 02/07/2020 11/15/19   Trula Slade, DPM  atorvastatin (LIPITOR) 80 MG tablet Take 1 tablet (80 mg total) by mouth daily at 6 PM. 10/18/19   Gonfa, Charlesetta Ivory, MD  benzonatate (TESSALON) 200 MG capsule Take 1 capsule (200 mg total) by mouth 3 (three) times daily as needed for cough. Patient not taking: Reported on 02/21/2020 10/18/19   Mercy Riding, MD  carvedilol (COREG) 12.5 MG tablet Take 1 tablet (12.5 mg total) by mouth 2 (two) times daily with a meal. 10/18/19   Mercy Riding, MD  empagliflozin (JARDIANCE) 10 MG TABS tablet Take 10 mg by mouth daily before breakfast. 10/18/19   Mercy Riding, MD  Ensure Max Protein (ENSURE MAX PROTEIN) LIQD Take 330 mLs (11 oz total) by mouth 2 (two) times daily. 10/18/19   Mercy Riding, MD  furosemide (LASIX) 80 MG tablet Take 1 tablet (80 mg total) by mouth 2 (two) times daily. 10/18/19   Mercy Riding, MD  gabapentin (NEURONTIN) 300 MG capsule Take 1 capsule (300 mg total) by mouth 2 (two) times daily. 10/18/19 12/17/19  Mercy Riding, MD  glucose blood (ACCU-CHEK GUIDE)  test strip 1 each by Other route 3 (three) times daily before meals. 11/14/19 11/13/20  [provider]  HYDROcodone-acetaminophen (NORCO/VICODIN) 5-325 MG tablet Take 1 tablet by mouth every 6 (six) hours as needed. Patient not taking: Reported on 02/10/2020 12/05/19   Trula Slade, DPM  insulin aspart (NOVOLOG) 100 UNIT/ML injection Inject 0-20 Units into the skin 3 (three) times daily with meals. CBG 70 - 120: 0 units CBG 121 - 150: 3 units CBG 151 - 200: 4 units CBG 201 - 250: 7 units CBG 251 - 300: 11 units CBG 301 - 350: 15 units CBG 351 - 400: 20 units CBG > 400: call MD and obtain STAT lab verification 10/18/19   Mercy Riding, MD  insulin aspart protamine - aspart (NOVOLOG MIX 70/30 FLEXPEN) (70-30) 100 UNIT/ML FlexPen Inject 60 Units into the skin 2 (two) times daily. 11/14/19   [provider]  insulin NPH Human (NOVOLIN N) 100 UNIT/ML injection Inject 0.35 mLs (35 Units total) into the skin 2 (two) times daily at 8 am and 10 pm. 10/18/19   Mercy Riding, MD  metFORMIN (GLUCOPHAGE-XR) 500 MG 24 hr tablet Take 2 tablets (1,000 mg total) by mouth in the morning and at bedtime. 10/18/19   Mercy Riding, MD  Multiple Vitamin (MULTIVITAMIN WITH MINERALS) TABS tablet Take 1 tablet by mouth daily. 10/18/19   Mercy Riding, MD  nitroGLYCERIN (NITROSTAT) 0.4 MG SL tablet Place 1 tablet (0.4 mg total) under the tongue every 5 (five) minutes x 3 doses as needed for chest pain. 02/03/15   Kelvin Cellar, MD  oxyCODONE-acetaminophen (PERCOCET/ROXICET) 5-325 MG tablet Take 1 tablet by mouth every 8 (eight) hours as needed. 12/03/19   [provider]  polyethylene glycol (MIRALAX / GLYCOLAX) 17 g packet Take 17 g by mouth 2 (two) times daily as needed for mild constipation. 10/18/19   Mercy Riding, MD  rivaroxaban (XARELTO) 20 MG TABS tablet Take 1 tablet (20 mg total) by mouth daily with supper. 10/18/19   Mercy Riding, MD  saccharomyces boulardii (FLORASTOR) 250 MG capsule Take 1 capsule (250 mg total) by mouth 2 (two) times daily. 10/18/19   Mercy Riding, MD  sacubitril-valsartan (ENTRESTO) 24-26 MG Take 1 tablet by mouth 2 (two) times daily. 10/18/19   Mercy Riding, MD  sildenafil (VIAGRA) 25 MG tablet Take by mouth. 01/03/20   [provider]  SYNJARDY XR 11-998 MG TB24 Take 1 tablet by mouth 2 (two) times daily. 11/17/19   [provider]  Vitamin D, Ergocalciferol, (DRISDOL) 1.25 MG (50000 UNIT) CAPS capsule Take 1 capsule (50,000 Units total) by mouth every 7 (seven) days. 10/23/19   Mercy Riding, MD    Magdalen Spatz, MSN, AGACNP-BC [place] for personal pager PCCM on call pager [phone] 02/19/20  8:18 AM  Critical Care APP Time : 45 minutes

## 2020-02-19 NOTE — Progress Notes (Signed)
ANTICOAGULATION CONSULT NOTE  Pharmacy Consult for heparin Indication: atrial fibrillation  Assessment: 54 yom on Xarelto PTA for afib presenting s/p CPR with ROSC, intubated and code cool initiated. Pharmacy consulted to dose heparin for afib. CBC wnl, SCr 1.3. No active bleed issues reported. Patient previously had heparin SQ for VTE prophylaxis ordered this admit - no doses given.  Significant other states she does not know exactly which medications Maurice Nguyen takes, but he took meds at 0730 on 7/26. However, Xarelto not filled since 30-day supply in April 2021.  Baseline hep lvl < 0.1 indicating non compliance to xarelto Heparin level now 0.12 units/ml  Goal of Therapy:  Heparin level 0.3-0.7 units/ml aPTT 66-102 seconds Monitor platelets by anticoagulation protocol: Yes   Plan:  Increase heparin to 1300 units/hr Check heparin level in 6 hours  Thanks for allowing pharmacy to be a part of this patient's care.  Talbert Cage, PharmD Clinical Pharmacist 02/19/2020 12:54 AM

## 2020-02-19 NOTE — Progress Notes (Signed)
vLTM EEG maintainance complete. No skin breakdown at electrode site FP1 FP2 F7 F8

## 2020-02-19 NOTE — Progress Notes (Signed)
ANTICOAGULATION CONSULT NOTE  Pharmacy Consult for heparin Indication: atrial fibrillation  Assessment: 54 yom on Xarelto PTA for afib presenting s/p CPR with ROSC, intubated and code cool initiated. Pharmacy consulted to dose heparin for afib.  Question compliance with xarelto. Significant other states she does not know exactly which medications Maurice Nguyen takes, but he took meds at 0730 on 7/26. However, Xarelto not filled since 30-day supply in April 2021. His initial anti-xa level was undetectable lending to further questions on compliance.   Heparin level at goal this am, aptt also within range and correlating. CBC stable. No bleeding issues noted.   Goal of Therapy:  Heparin level 0.3-0.7 units/ml Monitor platelets by anticoagulation protocol: Yes   Plan:  Continue heparin at 1300 units/hr Check heparin level in 6 hours to confirm  Thanks for allowing pharmacy to be a part of this patient's care.  Maurice Nguyen PharmD., BCPS Clinical Pharmacist 02/19/2020 9:22 AM

## 2020-02-19 NOTE — Progress Notes (Signed)
eLink Physician-Brief Progress Note Patient Name: Maurice Nguyen DOB: October 01, 1965 MRN: [id number]   Date of Service  02/19/2020  HPI/Events of Note  Notified of afib RVR 140s. On norepinephrine. Bradycardic on presentation  eICU Interventions  Ordered a one time dose of digoxin 0.125     Intervention Category Major Interventions: Arrhythmia - evaluation and management  Rosalie Gums Tahiry Spicer 02/19/2020, 10:53 PM

## 2020-02-19 NOTE — Plan of Care (Signed)
Remains in cooling phase (36C) of TTM for neuroprotection s/p cardiac arrest. Difficulty maintaining temp at beginning of shift - counter warming and fentanyl gtt, improved. On vent, sats stable.   Non purposeful movement when stimulated, opens eyes, does not track or focus, does not follow commands, + cough/gag, breathes over vent.   Tachycardia 110-130, Aflutter, Hx of Afib, intermittently irregular. EKG x 2 note possible lead reversal verified at time of EKG NOT reversed.   Decr urine output despite 1 L IVF bolus. CVP 9-11.   Abx as ordered. Heparin gtt, following Hep levels (pending).    Problem: Clinical Measurements: Goal: Ability to maintain clinical measurements within normal limits will improve Outcome: Progressing Goal: Will remain free from infection Outcome: Progressing Goal: Respiratory complications will improve Outcome: Progressing Goal: Cardiovascular complication will be avoided Outcome: Progressing   Problem: Elimination: Goal: Will not experience complications related to urinary retention Outcome: Progressing   Problem: Pain Managment: Goal: General experience of comfort will improve Outcome: Progressing   Problem: Safety: Goal: Ability to remain free from injury will improve Outcome: Progressing   Problem: Skin Integrity: Goal: Risk for impaired skin integrity will decrease Outcome: Progressing

## 2020-02-19 NOTE — Procedures (Addendum)
Patient Name: Maurice Nguyen  MRN: [id number]  Epilepsy Attending: Charlsie Quest  Referring Physician/Provider: Dr Peter [place] Duration:  01/27/2020 1655 to 02/19/2020 1655  Patient history: 54yo M s/p cardiac arrest on TTM. EEG to evaluate for seizure.   Level of alertness:  comatose  AEDs during EEG study: None  Technical aspects: This EEG study was done with scalp electrodes positioned according to the 10-20 International system of electrode placement. Electrical activity was acquired at a sampling rate of 500Hz  and reviewed with a high frequency filter of 70Hz  and a low frequency filter of 1Hz . EEG data were recorded continuously and digitally stored.   Description:  EEG initially showed continuous generalized suppression.  Gradually EEG showed continuous generalized sharply contoured 3 to 5 Hz theta-delta slowing.  Intermittently generalized periodic epileptiform discharges with triphasic morphology were also noted at 0.5 to 1 Hz.  Hyperventilation and photic stimulation were not performed.     Patient event button was pressed on 02/09/2020 at 1724.  On video patient was noted to have generalized whole body stiffening and right arm elevation.  Concomitant EEG before, during and after the event did not show any EEG changes suggest seizure.  ABNORMALITY - EEG suppression, generalized  - Continuous slow, generalized -Periodic epileptiform discharges with triphasic morphology, generalized  IMPRESSION: This study showed evidence of generalized epileptogenicity as well as severe diffuse encephalopathy, nonspecific etiology but likely related to anoxic/hypoxic brain injury  EEG appears to be improving compared to previous day.  [name] 

## 2020-02-20 ENCOUNTER — Inpatient Hospital Stay (HOSPITAL_COMMUNITY): Payer: Medicare HMO

## 2020-02-20 DIAGNOSIS — I5043 Acute on chronic combined systolic (congestive) and diastolic (congestive) heart failure: Secondary | ICD-10-CM

## 2020-02-20 DIAGNOSIS — G9340 Encephalopathy, unspecified: Secondary | ICD-10-CM

## 2020-02-20 LAB — HEPARIN LEVEL (UNFRACTIONATED)
Heparin Unfractionated: 0.26 [IU]/mL — ABNORMAL LOW (ref 0.30–0.70)
Heparin Unfractionated: 0.29 IU/mL — ABNORMAL LOW (ref 0.30–0.70)

## 2020-02-20 LAB — HEMOGLOBIN AND HEMATOCRIT, BLOOD
HCT: 35.3 % — ABNORMAL LOW (ref 39.0–52.0)
Hemoglobin: 11.7 g/dL — ABNORMAL LOW (ref 13.0–17.0)

## 2020-02-20 LAB — COMPREHENSIVE METABOLIC PANEL
ALT: 275 U/L — ABNORMAL HIGH (ref 0–44)
AST: 95 U/L — ABNORMAL HIGH (ref 15–41)
Albumin: 2.7 g/dL — ABNORMAL LOW (ref 3.5–5.0)
Alkaline Phosphatase: 82 U/L (ref 38–126)
Anion gap: 16 — ABNORMAL HIGH (ref 5–15)
BUN: 49 mg/dL — ABNORMAL HIGH (ref 6–20)
CO2: 22 mmol/L (ref 22–32)
Calcium: 8.3 mg/dL — ABNORMAL LOW (ref 8.9–10.3)
Chloride: 100 mmol/L (ref 98–111)
Creatinine, Ser: 3.75 mg/dL — ABNORMAL HIGH (ref 0.61–1.24)
GFR calc Af Amer: 20 mL/min — ABNORMAL LOW (ref >60)
GFR calc non Af Amer: 17 mL/min — ABNORMAL LOW (ref >60)
Glucose, Bld: 243 mg/dL — ABNORMAL HIGH (ref 70–99)
Potassium: 4.7 mmol/L (ref 3.5–5.1)
Sodium: 138 mmol/L (ref 135–145)
Total Bilirubin: 1.4 mg/dL — ABNORMAL HIGH (ref 0.3–1.2)
Total Protein: 6.8 g/dL (ref 6.5–8.1)

## 2020-02-20 LAB — LACTIC ACID, PLASMA: Lactic Acid, Venous: 3.1 mmol/L (ref 0.5–1.9)

## 2020-02-20 LAB — GLUCOSE, CAPILLARY
Glucose-Capillary: 134 mg/dL — ABNORMAL HIGH (ref 70–99)
Glucose-Capillary: 153 mg/dL — ABNORMAL HIGH (ref 70–99)
Glucose-Capillary: 170 mg/dL — ABNORMAL HIGH (ref 70–99)
Glucose-Capillary: 175 mg/dL — ABNORMAL HIGH (ref 70–99)
Glucose-Capillary: 211 mg/dL — ABNORMAL HIGH (ref 70–99)
Glucose-Capillary: 230 mg/dL — ABNORMAL HIGH (ref 70–99)
Glucose-Capillary: 233 mg/dL — ABNORMAL HIGH (ref 70–99)

## 2020-02-20 LAB — CBC
HCT: 42.3 % (ref 39.0–52.0)
Hemoglobin: 13.9 g/dL (ref 13.0–17.0)
MCH: 31.8 pg (ref 26.0–34.0)
MCHC: 32.9 g/dL (ref 30.0–36.0)
MCV: 96.8 fL (ref 80.0–100.0)
Platelets: 149 K/uL — ABNORMAL LOW (ref 150–400)
RBC: 4.37 MIL/uL (ref 4.22–5.81)
RDW: 14.1 % (ref 11.5–15.5)
WBC: 20.3 K/uL — ABNORMAL HIGH (ref 4.0–10.5)
nRBC: 0.1 % (ref 0.0–0.2)

## 2020-02-20 LAB — MAGNESIUM: Magnesium: 1.8 mg/dL (ref 1.7–2.4)

## 2020-02-20 LAB — COOXEMETRY PANEL
Carboxyhemoglobin: 0.6 % (ref 0.5–1.5)
Methemoglobin: 0.6 % (ref 0.0–1.5)
O2 Saturation: 65.5 %
Total hemoglobin: 14.2 g/dL (ref 12.0–16.0)

## 2020-02-20 MED ORDER — FENTANYL 2500MCG IN NS 250ML (10MCG/ML) PREMIX INFUSION
0.0000 ug/h | INTRAVENOUS | Status: DC
  Administered 2020-02-20: 25 ug/h via INTRAVENOUS

## 2020-02-20 MED ORDER — AMIODARONE HCL 200 MG PO TABS: 200.0000 mg | ORAL_TABLET | Freq: Two times a day (BID) | ORAL | Status: DC

## 2020-02-20 MED ORDER — SODIUM CHLORIDE 0.9 % IV SOLN: 2000.0000 mg | Freq: Two times a day (BID) | INTRAVENOUS | Status: DC

## 2020-02-20 MED ORDER — AMIODARONE HCL IN DEXTROSE 360-4.14 MG/200ML-% IV SOLN
30.0000 mg/h | INTRAVENOUS | Status: DC
  Administered 2020-02-20 – 2020-02-27 (×15): 30 mg/h via INTRAVENOUS
  Filled 2020-02-20 (×16): qty 200

## 2020-02-20 MED ORDER — LACTATED RINGERS IV BOLUS
500.0000 mL | Freq: Once | INTRAVENOUS | Status: AC
  Administered 2020-02-20: 500 mL via INTRAVENOUS

## 2020-02-20 MED ORDER — FENTANYL CITRATE (PF) 100 MCG/2ML IJ SOLN: 25.0000 ug | INTRAMUSCULAR | Status: DC | PRN

## 2020-02-20 MED ORDER — LEVETIRACETAM IN NACL 1000 MG/100ML IV SOLN: 1000.0000 mg | Freq: Two times a day (BID) | INTRAVENOUS | Status: DC

## 2020-02-20 MED ORDER — LEVETIRACETAM IN NACL 500 MG/100ML IV SOLN
500.0000 mg | Freq: Two times a day (BID) | INTRAVENOUS | Status: DC
  Administered 2020-02-20 – 2020-02-27 (×14): 500 mg via INTRAVENOUS
  Filled 2020-02-20 (×14): qty 100

## 2020-02-20 MED ORDER — AMIODARONE HCL IN DEXTROSE 360-4.14 MG/200ML-% IV SOLN
60.0000 mg/h | INTRAVENOUS | Status: AC
  Administered 2020-02-20: 60 mg/h via INTRAVENOUS
  Filled 2020-02-20: qty 200

## 2020-02-20 MED ORDER — LORAZEPAM 2 MG/ML IJ SOLN
INTRAMUSCULAR | Status: AC
  Filled 2020-02-20: qty 1

## 2020-02-20 MED ORDER — AMIODARONE HCL 200 MG PO TABS: 200.0000 mg | ORAL_TABLET | Freq: Every day | ORAL | Status: DC

## 2020-02-20 MED ORDER — LEVETIRACETAM IN NACL 500 MG/100ML IV SOLN: 500.0000 mg | Freq: Two times a day (BID) | INTRAVENOUS | Status: DC

## 2020-02-20 MED ORDER — DEXMEDETOMIDINE HCL IN NACL 400 MCG/100ML IV SOLN
0.4000 ug/kg/h | INTRAVENOUS | Status: DC
  Administered 2020-02-20: 0.02 ug/kg/h via INTRAVENOUS
  Administered 2020-02-22: 0.2 ug/kg/h via INTRAVENOUS
  Administered 2020-02-23: 1 ug/kg/h via INTRAVENOUS
  Administered 2020-02-23: 0.3 ug/kg/h via INTRAVENOUS
  Administered 2020-02-23: 0.8 ug/kg/h via INTRAVENOUS
  Administered 2020-02-23: 1 ug/kg/h via INTRAVENOUS
  Administered 2020-02-23: 0.8 ug/kg/h via INTRAVENOUS
  Administered 2020-02-23 – 2020-02-24 (×2): 0.4 ug/kg/h via INTRAVENOUS
  Administered 2020-02-24: 1 ug/kg/h via INTRAVENOUS
  Filled 2020-02-20 (×11): qty 100

## 2020-02-20 MED ORDER — AMIODARONE HCL IN DEXTROSE 360-4.14 MG/200ML-% IV SOLN
INTRAVENOUS | Status: AC
  Administered 2020-02-20: 60 mg/h via INTRAVENOUS
  Filled 2020-02-20: qty 200

## 2020-02-20 MED ORDER — LEVETIRACETAM IN NACL 1000 MG/100ML IV SOLN
1000.0000 mg | Freq: Once | INTRAVENOUS | Status: AC
  Administered 2020-02-20: 1000 mg via INTRAVENOUS
  Filled 2020-02-20: qty 100

## 2020-02-20 MED ORDER — PANTOPRAZOLE SODIUM 40 MG IV SOLR
40.0000 mg | Freq: Two times a day (BID) | INTRAVENOUS | Status: DC
  Administered 2020-02-20 – 2020-02-27 (×14): 40 mg via INTRAVENOUS
  Filled 2020-02-20 (×14): qty 40

## 2020-02-20 MED ORDER — AMIODARONE LOAD VIA INFUSION
150.0000 mg | Freq: Once | INTRAVENOUS | Status: AC
  Administered 2020-02-20: 150 mg via INTRAVENOUS
  Filled 2020-02-20: qty 83.34

## 2020-02-20 MED ORDER — VANCOMYCIN VARIABLE DOSE PER UNSTABLE RENAL FUNCTION (PHARMACIST DOSING): Status: DC

## 2020-02-20 MED ORDER — INSULIN DETEMIR 100 UNIT/ML ~~LOC~~ SOLN
10.0000 [IU] | Freq: Every day | SUBCUTANEOUS | Status: DC
  Administered 2020-02-20 – 2020-02-27 (×8): 10 [IU] via SUBCUTANEOUS
  Filled 2020-02-20 (×9): qty 0.1

## 2020-02-20 MED ORDER — LORAZEPAM 2 MG/ML IJ SOLN
1.0000 mg | INTRAMUSCULAR | Status: DC | PRN
  Administered 2020-02-20 – 2020-02-24 (×2): 2 mg via INTRAVENOUS
  Filled 2020-02-20 (×2): qty 1

## 2020-02-20 MED ORDER — ACETAMINOPHEN 160 MG/5ML PO SOLN
650.0000 mg | ORAL | Status: DC | PRN
  Administered 2020-02-20 – 2020-02-25 (×5): 650 mg
  Filled 2020-02-20 (×6): qty 20.3

## 2020-02-20 MED ORDER — LEVETIRACETAM IN NACL 500 MG/100ML IV SOLN: 500.0000 mg | Freq: Once | INTRAVENOUS | Status: DC

## 2020-02-20 NOTE — Progress Notes (Signed)
Paged overnight regarding worsening AF w/ worsening ventricular rates to 160s, pressor requirements increasing overnight with rewarming.  Critical care service administered digoxin with little response.  Reviewed.  Paroxysmal AF since presentation.  Ischemic hepatopathy at presentation.  Plan - Start amiodarone load and infusion. - Monitor liver enzymes closely; if worsening transaminitis despite improving shock then would discontinue promptly.

## 2020-02-20 NOTE — Consult Note (Signed)
Neurology Consultation Reason for Consult: Seizures Referring Physician: Dr. Cyril Mourning  CC: Cardiac arrest  History is obtained from: Chart review and patient's wife at bedside  HPI: Maurice Nguyen is a 54 y.o. male with past medical history of nonischemic cardiomyopathy, congestive heart failure with preserved ejection fraction, atrial fibrillation, hypertension and type 2 diabetes who presented on 02/02/2020 after cardiac arrest.  Patient's wife reported that she woke up around 7 AM and had some breakfast with her husband after which he went back to sleep.  She woke up again at around 730 and noted that her husband was having agonal breathing and was not responsive.  EMS was called and patient was noted to have a pulse at that time however shortly thereafter he went into asystole requiring 8 rounds of CPR.  Total downtime about 30 minutes.  On arrival to the hospital, he was noted to be hypothermic and started on targeted temperature management.  EEG was started as part of hypothermia protocol and showed worsening generalized periodic epileptiform discharges after rewarming.  Therefore, neurology was consulted.  ROS: Unable to obtain due to altered mental status.   Past Medical History:  Diagnosis Date  . A-fib (HCC)   . Anticoagulated, on Xarelto, Chads2Vas score 2 10/02/2013  . Back pain, chronic 07/13/2012  . CHF (congestive heart failure) (HCC)   . 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 CKD (chronic kidney disease), stage II   . Complication of anesthesia    " THEY LOST ME AND BROUGHT ME BACK "  . Hypertension 07/13/2012  . Irregular heart beat   . Lumbar herniated disc   . Morbid obesity (HCC)   . Obesity, morbid, BMI 50 or higher (HCC) 07/13/2012  . 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 Renal insufficiency 10/08/2019  . Shortness of breath   . Type II diabetes mellitus (HCC) 07/13/2012    Family History  Problem Relation Age of Onset  . Heart disease  Neg Hx     Social History: Per chart review, he has never smoked. He has never used smokeless tobacco. He reports that he does not drink alcohol and does not use drugs.  Exam: Current vital signs: BP 109/81 (BP Location: Left Wrist)   Pulse (!) 106   Temp 98.1 F (36.7 C) (Bladder)   Resp 18   Ht 6\' 2"  (1.88 m)   Wt (!) 171.4 kg   SpO2 100%   BMI 48.52 kg/m  Vital signs in last 24 hours: Temp:  [96.3 F (35.7 C)-99.9 F (37.7 C)] 98.1 F (36.7 C) (07/28 1200) Pulse Rate:  [34-155] 106 (07/28 1200) Resp:  [17-30] 18 (07/28 1200) BP: (88-133)/(68-111) 109/81 (07/28 1200) SpO2:  [100 %] 100 % (07/28 1200) Arterial Line BP: (81-123)/(49-70) 84/49 (07/28 1200) FiO2 (%):  [40 %] 40 % (07/28 1200) Weight:  [171.4 kg] 171.4 kg (07/28 0347)   Physical Exam  Constitutional: Laying in bed, not in apparent distress Psych: Unable to assess secondary intubation Head: Normocephalic, atraumatic Cardiovascular: Normal rate and regular rhythm.  Respiratory: Intubated, decreased breath sounds bilaterally GI: Soft.  No distension. There is no tenderness.  Skin: Cold extremities, no apparent rash Neuro: Comatose, did not open eyes to noxious stimuli, pupils equally round and reactive, corneal reflex intact, gag reflex intact, withdraws to noxious stimuli with minimal movement in right upper extremity and bilateral lower extremities, no movement in left upper extremity  I have reviewed labs in epic and the  results pertinent to this consultation are: WBC 20.3 Platelets 149 BUN 49 with serum creatinine 3.75, GFR 20 Calcium 8.3 Albumin 2.7 AST 95 ALT 275 Total bilirubin 1.4 Lactic acid 3.1  I have reviewed the images obtained: CT head without contrast 2020-03-09: Mild frontal and parietal atrophy, stable. Prior infarct inferior left occipital lobe, stable. Mild periventricular small vessel disease, likewise stable. No acute infarct demonstrable. No mass or Hemorrhage. Foci of arterial  vascular calcification noted. Foci of paranasal sinus disease noted. Patient intubated.  ASSESSMENT/PLAN: 54 year old male with multiple comorbidities as noted above presented after cardiac arrest status post TTM, total downtime about 30 minutes. Continuous EEG showed generalized periodic epileptiform discharges, currently on Keppra.  Cardiac arrest status post TTM Suspected anoxic/hypoxic brain injury Hypocalcemia Hypoalbuminemia Transaminitis Hyperbilirubinemia Lactic acidosis AKI -Continuous EEG overnight showed worsening epileptiform discharges  Recommendations -We will give 1000 mg IV Keppra load and start on 500 mg twice daily (renally dosed) -If EEG worsens or patient has any clinical seizures, will add valproic acid -Continue LTM EEG while we adjust seizure medications -We will plan to obtain MRI brain without contrast to look for anoxic/hypoxic brain injury most likely tomorrow -Patient's wife and father were at bedside. Discussed that I am concerned patient has sustained hypoxic/anoxic brain injury. I also discussed that patient is starting to have epileptiform discharges and is being started on antiseizure medication. Patient's wife became tearful. -Management of rest of comorbidities per primary team  CRITICAL CARE Performed by: Charlsie Quest   Total critical care time: 45 minutes  Critical care time was exclusive of separately billable procedures and treating other patients.  Critical care was necessary to treat or prevent imminent or life-threatening deterioration.  Critical care was time spent personally by me on the following activities: development of treatment plan with patient and/or surrogate as well as nursing, discussions with consultants, evaluation of patient's response to treatment, examination of patient, obtaining history from patient or surrogate, ordering and performing treatments and interventions, ordering and review of laboratory studies, ordering  and review of radiographic studies, pulse oximetry and re-evaluation of patient's condition.  Lindie Spruce Epilepsy [facility]

## 2020-02-20 NOTE — Progress Notes (Signed)
Pharmacy Antibiotic Note  Maurice Nguyen is a 54 y.o. male admitted on 02/17/2020 with sepsis.  Pharmacy has been consulted for vancomycin/cefepime dosing.   AFebrile, WBC down to 20. AKI worsening to 3.7 this morning. Will adjust cefepime. Vancomycin given last night, will hold further dosing and consider random level in am if to continue.   Plan: Cefepime 2g IV q24 with AKI Vancomycin changed to 1750 q24 given last night 7/27, will hold further doses Consider random level prior to redosing vs q48h schedule Monitor clinical progress, c/s, renal function F/u de-escalation plan/LOT, vancomycin levels as indicated   Height: 6\' 2"  (188 cm) Weight: (!) 171.4 kg (377 lb 13.9 oz) IBW/kg (Calculated) : 82.2  Temp (24hrs), Avg:98.5 F (36.9 C), Min:96.3 F (35.7 C), Max:99.9 F (37.7 C)  Recent Labs  Lab 01/27/2020 1014 02/21/2020 1014 02/17/2020 1022 02/08/2020 1119 02/09/2020 1544 02/16/2020 1627 02/12/2020 1741 02/12/2020 2312 02/01/2020 2341 02/19/20 0412 02/20/20 0405 02/20/20 0407  WBC 26.4*  --   --   --   --   --  18.0*  --   --  17.8* 20.3*  --   CREATININE 1.61*   < > 1.30*  --   --  2.08*  --   --  2.48* 2.74* 3.75*  --   LATICACIDVEN  --   --   --  7.1* 2.9*  --   --  4.7*  --   --   --  3.1*   < > = values in this interval not displayed.    Estimated Creatinine Clearance: 36.7 mL/min (A) (by C-G formula based on SCr of 3.75 mg/dL (H)).    No Known Allergies  Antimicrobials this admission: 7/26 vancomycin >>  7/26 cefepime >>   Microbiology results: 7/27 urine - sent 7/26 bld-ngtd 7/26 TA - sent  8/26 PharmD., BCPS Clinical Pharmacist 02/20/2020 2:25 PM

## 2020-02-20 NOTE — Consult Note (Addendum)
WOC Nurse Consult Note: Reason for Consult: Consult requested for left foot wound.  Family at bedside states patient is followed by [facility] as an outpatient and was using a "blue dressing twice a week."  Informed them we do not have this product in the [facility] formulary and will substutute a similar product and they verbalized understanding.  Pt is critically ill with multiple systemic factors which can impair healing, including pressors.  Wound type: Chronic full thickness wound to left plantar foot in the heel area; 1X1X.5cm, red and moist with small amt tan drainage, no odor, white macerated skin and pink scar tissue surrounding the wound. Dressing procedure/placement/frequency: Topical treatment orders provided for bedside nurses to perform as follows to provide antimicrobial benefits and absorb drainage: Change dressing to left foot Q MON/WED/FRI as follows: cut piece of Aquacel and tuck into left foot wound and cover with gauze and kerlex and ace wrap. Moisten previous dressing with NS to remove.  Prevalon boots to reduce pressure to BLE.  Please re-consult if further assistance is needed.  Thank-you,  Cammie Mcgee MSN, RN, CWOCN, [place], CNS [phone]

## 2020-02-20 NOTE — Progress Notes (Signed)
eLink Physician-Brief Progress Note Patient Name: Maurice Nguyen DOB: May 20, 1966 MRN: [id number]   Date of Service  02/20/2020  HPI/Events of Note  Camera for hypotension stable on Levo 26/milrinone/amio/heparin gtt. Coffee ground earlier in the shift. Last Hg > 12. In synchrony 40%. CVP low < 4. + 3 lit. EF 30%.  eICU Interventions  - LR 500 ml bolus - stat Hg - Protonix 40 q12 hr. ? GIB upper. - transfuse if Hg dropping and GI consult.      Intervention Category Major Interventions: Hypotension - evaluation and management  Ranee Gosselin 02/20/2020, 11:30 PM

## 2020-02-20 NOTE — Procedures (Addendum)
Patient Name:Maurice Nguyen [name]:[id number] Epilepsy Attending:Devyn Sheerin Annabelle Harman Referring Physician/Provider:Dr Peter [place] Duration: 02/19/2020 1655 to 02/20/2020 1655  Patient history:54yo M s/p cardiac arrest on TTM. EEG to evaluate for seizure.  Level of alertness:comatose  AEDs during EEG study:None  Technical aspects: This EEG study was done with scalp electrodes positioned according to the 10-20 International system of electrode placement. Electrical activity was acquired at a sampling rate of 500Hz  and reviewed with a high frequency filter of 70Hz  and a low frequency filter of 1Hz . EEG data were recorded continuously and digitally stored.   Description: EEG showed continuous generalized sharply contoured 3 to 5 Hz theta-delta slowing. Initially EEG was showing generalized periodic epileptiform discharges with triphasic morphology at 0.5 to 1 Hz which gradually increased in frequency to 1.5 to 2 Hz.  At times, the discharges appeared rhythmic and were seen in run of 5 to 6 seconds without definite evolution consistent with brief ictal-interictal rhythmic discharges ( BIRDs). Hyperventilation and photic stimulation were not performed.   Event button was pressed on 02/20/2020 at 0954 for whole body shaking.  Concomitant EEG showed generalized periodic epileptiform discharges at 3 Hz which gradually evolved to 1.5 to 2 Hz, lasting about 30 seconds  ABNORMALITY -Seizure, generalized - Continuous slow, generalized - Periodic epileptiform discharges, generalized - BIRDs  IMPRESSION: This studyshowed one generalized tonic-clonic seizure on 02/20/2020 at 0954 with generalized onset, lasting for 30 seconds.  There is also evidence of generalized epileptogenicity with brief ictal-interictal rhythmic discharges which are on the ictal-interictal continuum. Additionally there is evidence of profound diffuse encephalopathy, nonspecific etiology but likely related to  anoxic/hypoxic brain injury.  EEG appears to be worsening compared to previous day.  [name] 

## 2020-02-20 NOTE — Progress Notes (Signed)
LTM maint complete - no skin breakdown under: GY6,Z9,D3,T7

## 2020-02-20 NOTE — Progress Notes (Signed)
Called emergently to room pt awake but not following commands. Gagging/coughing on ett with audible cuff leak and copious tube feed appearing secretions from ett.   -Tf held -bolused with fentanyl -pt tachycardic and hypertensive on norepi -weaned norepi from 16 to 12 mcg -cxr pending -og to suction -reassured family   Once pt calm have d/w RN would prefer precedex infusion and prn fentanyl to optimize neuro exams.

## 2020-02-20 NOTE — Progress Notes (Signed)
CDS referral made. #[id number]. Caitlyn RN notified to expect a phone call from the coordinator.

## 2020-02-20 NOTE — Progress Notes (Signed)
ANTICOAGULATION CONSULT NOTE - Follow Up Consult  Pharmacy Consult for heparin Indication: atrial fibrillation  Labs: Recent Labs    02/09/2020 1014 02/04/2020 1014 02/20/2020 1022 02/11/2020 1040 01/25/2020 1126 02/06/2020 1627 02/16/2020 1627 02/10/2020 1741 02/14/2020 1741 02/11/2020 2341 02/02/2020 2341 02/19/20 0412 02/19/20 0412 02/19/20 0414 02/19/20 0615 02/19/20 1100 02/20/20 0405  HGB 14.5   < >   < >  --    < >  --   --  14.4   < >  --   --  14.9   < > 16.0  --   --  13.9  HCT 46.8   < >   < >  --    < >  --   --  44.0   < >  --   --  46.1  --  47.0  --   --  42.3  PLT 221   < >  --   --   --   --   --  214  --   --   --  176  --   --   --   --  149*  APTT  --   --   --   --   --  33  --   --   --   --   --   --   --   --  88*  --   --   LABPROT  --   --   --  17.0*  --   --   --   --   --   --   --   --   --   --   --   --   --   INR  --   --   --  1.4*  --   --   --   --   --   --   --   --   --   --   --   --   --   HEPARINUNFRC  --   --   --   --   --  <0.10*   < >  --   --  0.12*   < >  --   --   --  0.44 0.46 0.26*  CREATININE 1.61*  --    < >  --    < > 2.08*   < >  --   --  2.48*  --  2.74*  --   --   --   --  3.75*  TROPONINIHS 169*  --   --   --   --  7,310*  --   --   --   --   --   --   --   --   --   --   --    < > = values in this interval not displayed.    Assessment: 54yo male subtherapeutic on heparin after two levels at goal; no gtt issues or signs of bleeding per RN.  Goal of Therapy:  Heparin level 0.3-0.7 units/ml   Plan:  Will increase heparin gtt by 1-2 units/kgABW/hr to 1500 units/hr and check level in 8 hours.    Vernard Gambles, PharmD, BCPS  02/20/2020,5:00 AM

## 2020-02-20 NOTE — Progress Notes (Signed)
Progress Note  Patient Name: Maurice Nguyen Date of Encounter: 02/20/2020  [facility] Cardiologist: Mertie Moores, MD    Subjective   54 year old gentleman with a history of morbid obesity, diabetes mellitus was admitted 7/26  with cardiac arrest.  UDS is positive for cocaine.  Is no longer having bradycardia.  Is in atrial flutter with rapid V response BP is still marginal   CVP is  9-10 Urine output is low.   Received IV NS bolus overnight    Creatinine is up to 3.75  Co-ox is 65 Opens his eyes No purposefull responses   Was started on amio last night for rapid AF  Is back on high dose Norepi    Inpatient Medications    Scheduled Meds: . amiodarone  200 mg Per Tube Q12H   Followed by  . [START ON 02/27/2020] amiodarone  200 mg Per Tube Daily  . chlorhexidine gluconate (MEDLINE KIT)  15 mL Mouth Rinse BID  . Chlorhexidine Gluconate Cloth  6 each Topical Daily  . docusate  100 mg Per Tube BID  . feeding supplement (PROSource TF)  90 mL Per Tube TID  . insulin aspart  0-15 Units Subcutaneous Q4H  . insulin detemir  10 Units Subcutaneous Daily  . mouth rinse  15 mL Mouth Rinse 10 times per day  . pantoprazole sodium  40 mg Per Tube Daily  . polyethylene glycol  17 g Per Tube Daily  . sodium chloride flush  10-40 mL Intracatheter Q12H  . sodium chloride flush  3 mL Intravenous Q12H  . vancomycin variable dose per unstable renal function (pharmacist dosing)   Does not apply See admin instructions   Continuous Infusions: . sodium chloride    . sodium chloride Stopped (02/20/20 0021)  . sodium chloride    . amiodarone 30 mg/hr (02/20/20 0710)  . ceFEPime (MAXIPIME) IV Stopped (02/19/20 2325)  . feeding supplement (VITAL AF 1.2 CAL) 1,000 mL (02/19/20 1426)  . heparin 1,500 Units/hr (02/20/20 0800)  . milrinone 0.125 mcg/kg/min (02/20/20 0800)  . norepinephrine (LEVOPHED) Adult infusion 30 mcg/min (02/20/20 0800)   PRN Meds: sodium chloride, docusate,  fentaNYL (SUBLIMAZE) injection, polyethylene glycol, sodium chloride flush, sodium chloride flush   Vital Signs    Vitals:   02/20/20 0700 02/20/20 0748 02/20/20 0800 02/20/20 0833  BP: (!) 114/91 (!) 125/98 (!) 126/97   Pulse: 101 (!) 128 (!) 111   Resp: 19 (!) 30 20   Temp:    98.4 F (36.9 C)  TempSrc:      SpO2: 100% 100% 100%   Weight:      Height:        Intake/Output Summary (Last 24 hours) at 02/20/2020 0907 Last data filed at 02/20/2020 0800 Gross per 24 hour  Intake 2295.76 ml  Output 510 ml  Net 1785.76 ml   Last 3 Weights 02/20/2020 02/19/2020 02/02/2020  Weight (lbs) 377 lb 13.9 oz 371 lb 14.7 oz 408 lb 1.1 oz  Weight (kg) 171.4 kg 168.7 kg 185.1 kg      Telemetry    Atrial fib with RVR  - Personally Reviewed  ECG     - Personally Reviewed  Physical Exam   Physical Exam: Blood pressure (!) 126/97, pulse (!) 111, temperature 98.4 F (36.9 C), resp. rate 20, height '6\' 2"'  (1.88 m), weight (!) 171.4 kg, SpO2 100 %.  GEN:  Morbidly obese male,   On the vent  HEENT: Normal NECK: No JVD; No carotid bruits  LYMPHATICS: No lymphadenopathy CARDIAC: irreg irreg.  RESPIRATORY:  Vent  ABDOMEN: obese, + BS  MUSCULOSKELETAL:  No edema; No deformity  SKIN: Warm and dry NEUROLOGIC:  Unresponsive  .   Labs    High Sensitivity Troponin:   Recent Labs  Lab 02/15/2020 1014 02/03/2020 1627  TROPONINIHS 169* 7,310*      Chemistry Recent Labs  Lab 01/24/2020 1014 01/26/2020 1022 02/10/2020 2341 02/07/2020 2341 02/19/20 0412 02/19/20 0414 02/20/20 0405  NA 136   < > 137   < > 138 140 138  K 5.5*   < > 4.2   < > 4.5 4.4 4.7  CL 101   < > 103  --  103  --  100  CO2 17*   < > 19*  --  19*  --  22  GLUCOSE 306*   < > 229*  --  211*  --  243*  BUN 25*   < > 34*  --  38*  --  49*  CREATININE 1.61*   < > 2.48*  --  2.74*  --  3.75*  CALCIUM 8.2*   < > 8.0*  --  8.3*  --  8.3*  PROT 6.1*  --   --   --   --   --  6.8  ALBUMIN 2.9*  --   --   --   --   --  2.7*  AST 785*   --   --   --   --   --  95*  ALT 641*  --   --   --   --   --  275*  ALKPHOS 124  --   --   --   --   --  82  BILITOT 1.7*  --   --   --   --   --  1.4*  GFRNONAA 47*   < > 28*  --  25*  --  17*  GFRAA 55*   < > 32*  --  29*  --  20*  ANIONGAP 18*   < > 15  --  16*  --  16*   < > = values in this interval not displayed.     Hematology Recent Labs  Lab 02/23/2020 1741 02/22/2020 1741 02/19/20 0412 02/19/20 0414 02/20/20 0405  WBC 18.0*  --  17.8*  --  20.3*  RBC 4.59  --  4.76  --  4.37  HGB 14.4   < > 14.9 16.0 13.9  HCT 44.0   < > 46.1 47.0 42.3  MCV 95.9  --  96.8  --  96.8  MCH 31.4  --  31.3  --  31.8  MCHC 32.7  --  32.3  --  32.9  RDW 13.7  --  13.9  --  14.1  PLT 214  --  176  --  149*   < > = values in this interval not displayed.    BNP Recent Labs  Lab 01/27/2020 1030  BNP 158.1*     DDimer No results for input(s): DDIMER in the last 168 hours.   Radiology    CT Head Wo Contrast  Result Date: 02/13/2020 CLINICAL DATA:  Cardiac arrest with altered mental status EXAM: CT HEAD WITHOUT CONTRAST TECHNIQUE: Contiguous axial images were obtained from the base of the skull through the vertex without intravenous contrast. COMPARISON:  Dec 11, 2019 FINDINGS: Brain: Ventricles are normal in size and configuration. There is mild frontal and parietal  lobe atrophy, stable. There is no intracranial mass, hemorrhage, extra-axial fluid collection, or midline shift. There is evidence of a prior infarct in the inferior most aspect of the left occipital lobe, stable. There is slight small vessel disease in the centra semiovale bilaterally. No acute infarct is appreciable. Vascular: No hyperdense vessel. There are foci of calcification in each carotid siphon region. Skull: The bony calvarium appears intact. Sinuses/Orbits: Patient is intubated. There is mucosal thickening in multiple ethmoid air cells. There is mucosal thickening in the anterior sphenoid sinus. There is opacification in  posterior nares regions, likely a function of the intubation. Orbits appear symmetric bilaterally. Other: Mastoid air cells are clear. IMPRESSION: Mild frontal and parietal atrophy, stable. Prior infarct inferior left occipital lobe, stable. Mild periventricular small vessel disease, likewise stable. No acute infarct demonstrable. No mass or hemorrhage. Foci of arterial vascular calcification noted. Foci of paranasal sinus disease noted. Patient intubated. Electronically Signed   By: Lowella Grip III M.D.   On: 02/23/2020 11:15   CARDIAC CATHETERIZATION  Result Date: 02/10/2020 Successful placement of a temporary transvenous pacemaker into the RV.  DG CHEST PORT 1 VIEW  Result Date: 02/20/2020 CLINICAL DATA:  Endotracheal tube present. EXAM: PORTABLE CHEST 1 VIEW COMPARISON:  February 19, 2020. FINDINGS: Stable cardiomegaly. Nasogastric tube is seen entering the stomach. Endotracheal tube is partially visualized. Left internal jugular catheter is unchanged. No pneumothorax is noted. Central pulmonary vascular congestion is noted with bibasilar atelectasis or edema. Bony thorax is unremarkable. IMPRESSION: Stable cardiomegaly. Central pulmonary vascular congestion is noted with bibasilar atelectasis or edema. Stable support apparatus. No pneumothorax is noted. Electronically Signed   By: Marijo Conception M.D.   On: 02/20/2020 08:12   DG Chest Port 1 View  Result Date: 02/19/2020 CLINICAL DATA:  Respiratory failure. EXAM: PORTABLE CHEST 1 VIEW COMPARISON:  02/13/2020. FINDINGS: Endotracheal tube, NG tube, left IJ line stable position. Cardiomegaly. Pulmonary venous congestion. Low lung volumes. Mild bilateral interstitial prominence suggesting mild CHF. Mild right base alveolar infiltrates/edema noted on today's exam. No pneumothorax. IMPRESSION: 1.  Lines and tubes in stable position. 2. Cardiomegaly with pulmonary venous congestion. Low lung volumes with mild bilateral interstitial prominence suggesting  mild CHF noted on today's exam. 3.  Mild right base alveolar infiltrate/edema noted on today's exam. Electronically Signed   By: Marcello Moores  Register   On: 02/19/2020 06:41   DG CHEST PORT 1 VIEW  Result Date: 02/21/2020 CLINICAL DATA:  Status post central line placement today. EXAM: PORTABLE CHEST 1 VIEW COMPARISON:  Single-view of the chest earlier today. FINDINGS: The patient has a new left IJ approach central venous catheter with the tip projecting in the mid superior vena cava. No pneumothorax. NG tube and endotracheal tube remain in place in good position. Lung volumes are low but the lungs appear clear. IMPRESSION: New left IJ approach central venous catheter tip projects in the mid superior vena cava. No pneumothorax. Endotracheal tube and NG tube in good position. Lungs appear clear. Electronically Signed   By: Inge Rise M.D.   On: 02/04/2020 15:46   DG CHEST PORT 1 VIEW  Result Date: 02/05/2020 CLINICAL DATA:  ETT advancement EXAM: PORTABLE CHEST 1 VIEW COMPARISON:  01/31/2020 at 1022 hours FINDINGS: Interval advancement of endotracheal tube which now terminates 2.5 cm above the carina. Enteric tube courses below the diaphragm with distal tip beyond the inferior margin of the film. Low lung volumes. Stable cardiomegaly. Pulmonary vascular congestion without overt edema. No lobar consolidation. Left costophrenic  angle was excluded from the field of view. No pneumothorax. IMPRESSION: 1. Interval advancement of endotracheal tube which now terminates 2.5 cm above the carina. 2. Otherwise, stable chest. Electronically Signed   By: Davina Poke D.O.   On: 02/03/2020 12:15   DG Chest Portable 1 View  Result Date: 01/31/2020 CLINICAL DATA:  Hypoxia EXAM: PORTABLE CHEST 1 VIEW COMPARISON:  October 08, 2019 FINDINGS: Endotracheal tube tip is at the T1 level, 8.2 cm above the carina. Nasogastric tube tip and side port in stomach. No pneumothorax. There is cardiomegaly with pulmonary venous  hypertension. There is no evident edema or airspace opacity. No adenopathy appreciable by radiography. IMPRESSION: Tube positions as described without pneumothorax. It may be prudent to consider advancing endotracheal tube 4-5 cm. There is cardiomegaly with pulmonary vascular congestion. No edema or airspace opacity. Electronically Signed   By: Lowella Grip III M.D.   On: 01/27/2020 10:40   EEG adult  Result Date: 02/01/2020 Lora Havens, MD     02/17/2020  5:09 PM Patient Name: AHMERE HEMENWAY MRN: [id number] Epilepsy Attending: Lora Havens Referring Physician/Provider: Dr Peter Martinique Date: 02/01/2020 Duration: 21.02 mins Patient history: 54yo M s/p cardiac arrest on TTM. EEG to evaluate for seizure. Level of alertness:  comatose AEDs during EEG study: None Technical aspects: This EEG study was done with scalp electrodes positioned according to the 10-20 International system of electrode placement. Electrical activity was acquired at a sampling rate of '500Hz'  and reviewed with a high frequency filter of '70Hz'  and a low frequency filter of '1Hz' . EEG data were recorded continuously and digitally stored. Description:  EEG showed continuous generalized suppression. EEG was not reactive to tactile stimuli. In the beginning of recording, burst of generalized polyspikes were also noted. Hyperventilation and photic stimulation were not performed.   ABNORMALITY - EEG suppression, generalized - Polyspikes, generalized IMPRESSION: This study showed evidence of generalized epileptogenicity as well as profound diffuse encephalopathy, nonspecific etiology but likely related to anoxic/hypoxic brain injury [place]   Overnight EEG with video  Result Date: 02/19/2020 Lora Havens, MD     02/19/2020  9:01 AM Patient Name: TIRTH COTHRON MRN: [id number] Epilepsy Attending: Lora Havens Referring Physician/Provider: Dr Peter Martinique Duration:  02/10/2020 1655 to 02/19/2020 0900  Patient history:  54yo M s/p cardiac arrest on TTM. EEG to evaluate for seizure.  Level of alertness:  comatose  AEDs during EEG study: None  Technical aspects: This EEG study was done with scalp electrodes positioned according to the 10-20 International system of electrode placement. Electrical activity was acquired at a sampling rate of '500Hz'  and reviewed with a high frequency filter of '70Hz'  and a low frequency filter of '1Hz' . EEG data were recorded continuously and digitally stored.  Description:  EEG initially showed continuous generalized suppression.  Gradually EEG showed continuous generalized sharply contoured 3 to 5 Hz theta-delta slowing.  Intermittently generalized periodic epileptiform discharges with triphasic morphology were also noted at 0.5 to 1 Hz.  Hyperventilation and photic stimulation were not performed.   Patient event button was pressed on 02/10/2020 at 1724.  On video patient was noted to have generalized whole body stiffening and right arm elevation.  Concomitant EEG before, during and after the event did not show any EEG changes suggest seizure.  ABNORMALITY - EEG suppression, generalized -continuous slow, generalized -Periodic epileptiform discharges with triphasic morphology, generalized  IMPRESSION: This study showed evidence of generalized epileptogenicity as well as severe diffuse encephalopathy, nonspecific  etiology but likely related to anoxic/hypoxic brain injury EEG appears to be improving compared to previous day.  Lora Havens   ECHOCARDIOGRAM COMPLETE  Result Date: 01/31/2020    ECHOCARDIOGRAM REPORT   Patient Name:   Maurice Nguyen Date of Exam: 02/06/2020 Medical Rec #:  [id number]          Height:       74.0 in Accession #:    [id number]         Weight:       408.1 lb Date of Birth:  Sep 19, 1965           BSA:          2.943 m Patient Age:    13 years           BP:           84/61 mmHg Patient Gender: M                  HR:           109 bpm. Exam Location:  Inpatient Procedure:  2D Echo Indications:    cardiac arrest  History:        Patient has prior history of Echocardiogram examinations, most                 recent 10/09/2019. Abnormal ECG, Arrythmias:Atrial Fibrillation;                 Risk Factors:Dyslipidemia, Hypertension, Diabetes and Sleep                 Apnea.  Sonographer:    Johny Chess Referring Phys: [id number] MATTHEW J TRIFAN  Sonographer Comments: Echo performed with patient supine and on artificial respirator. IMPRESSIONS  1. Left ventricular ejection fraction, by estimation, is 20 to 25%. The left ventricle has severely decreased function. The left ventricle demonstrates global hypokinesis. There is mild concentric left ventricular hypertrophy. Left ventricular diastolic  function could not be evaluated.  2. Right ventricular systolic function is moderately reduced. The right ventricular size is moderately enlarged.  3. The mitral valve is grossly normal. No evidence of mitral valve regurgitation. No evidence of mitral stenosis.  4. The aortic valve is tricuspid. Aortic valve regurgitation is not visualized. No aortic stenosis is present.  5. Aortic dilatation noted. There is mild dilatation of the aortic root measuring 41 mm. Comparison(s): Changes from prior study are noted. EF is now 20-25% with global HK. FINDINGS  Left Ventricle: Left ventricular ejection fraction, by estimation, is 20 to 25%. The left ventricle has severely decreased function. The left ventricle demonstrates global hypokinesis. The left ventricular internal cavity size was normal in size. There is mild concentric left ventricular hypertrophy. Left ventricular diastolic function could not be evaluated due to atrial fibrillation. Left ventricular diastolic function could not be evaluated. Right Ventricle: The right ventricular size is moderately enlarged. No increase in right ventricular wall thickness. Right ventricular systolic function is moderately reduced. Left Atrium: Left atrial size was  normal in size. Right Atrium: Right atrial size was normal in size. Pericardium: Trivial pericardial effusion is present. Mitral Valve: The mitral valve is grossly normal. Mild mitral annular calcification. No evidence of mitral valve regurgitation. No evidence of mitral valve stenosis. Tricuspid Valve: The tricuspid valve is grossly normal. Tricuspid valve regurgitation is not demonstrated. No evidence of tricuspid stenosis. Aortic Valve: The aortic valve is tricuspid. Aortic valve regurgitation is not visualized. No aortic stenosis is present. Pulmonic Valve:  The pulmonic valve was grossly normal. Pulmonic valve regurgitation is not visualized. No evidence of pulmonic stenosis. Aorta: Aortic dilatation noted. There is mild dilatation of the aortic root measuring 41 mm. Venous: IVC assessment for right atrial pressure unable to be performed due to mechanical ventilation. IAS/Shunts: The atrial septum is grossly normal. Additional Comments: A venous catheter is visualized in the inferior vena cava, right atrium and right ventricle.  LEFT VENTRICLE PLAX 2D LVIDd:         4.70 cm LVIDs:         4.00 cm LV PW:         1.40 cm LV IVS:        1.20 cm LVOT diam:     2.60 cm LVOT Area:     5.31 cm  IVC IVC diam: 2.20 cm LEFT ATRIUM              Index       RIGHT ATRIUM           Index LA diam:        4.10 cm  1.39 cm/m  RA Area:     16.60 cm LA Vol (A2C):   100.0 ml 33.98 ml/m RA Volume:   41.20 ml  14.00 ml/m LA Vol (A4C):   65.2 ml  22.15 ml/m LA Biplane Vol: 84.3 ml  28.65 ml/m   AORTA Ao Root diam: 4.10 cm Ao Asc diam:  3.60 cm  SHUNTS Systemic Diam: 2.60 cm Eleonore Chiquito MD Electronically signed by Eleonore Chiquito MD Signature Date/Time: 02/05/2020/6:34:09 PM    Final     Cardiac Studies     Patient Profile     54 y.o. male with acute on chronic combined CHF admitted after cardiac arrest.    Assessment & Plan    1.   Cardiac arrest:   In the setting of cocaine abuse Has been rewarmed. Cont current  support  EEG stil in progress.   2.   acute on chronic combined systolic and diastolic congestive heart failure: The patient was noted to have an EF of 30 to 35% in March, 2021.  Echocardiogram from yesterday reveals EF of 20 to 25%.  There is global hypokinesis.  We were unable to evaluate diastolic function.  The right ventricle is moderately enlarged.  There is mild dilatation of the aortic root at 41 mm.  Co-ox has improved on milrinone. Cont supportive cre   3.   Respiratory failure:   Appreciate PCCM's assistance with this .   4.  Acute kidney injury:  Creatinine continues to increase Making very little urine  Further management per PCCM        For questions or updates, please contact [place] Please consult [email or web address] for contact info under        Signed, Mertie Moores, MD  02/20/2020, 9:07 AM

## 2020-02-20 NOTE — Progress Notes (Addendum)
NAME:  Maurice Nguyen, MRN:  [id number], DOB:  [birth year], LOS: 2 ADMISSION DATE:  02/23/2020, CONSULTATION DATE:  02/01/2020 REFERRING MD:  Renaye Rakers, CHIEF COMPLAINT:  Cardiac arrest  Brief History   54yom s/p asystolic cardiac arrest. downtime.   History of present illness   54yom with NICM, HFrEF, afib, HTN, T2DM who presented via EMS s/p cardiac arrest. Wife notes that she woke up around 0700 at which time her husband was ok. They got up and ate some breakfast and when back to sleep. When she woke back up around 0730, she noted that he had agonal breathing and was non-responsive. EMS was called and pt was noted to have a pulse at that time. Shortly thereafter, he developed asystole, required 8 rounds of cpr. He transitioned into vfib arrest which responded appropriately to one shock. On arrival to the ED, he was noted to be hypothermic with temp of 83.57F. Labs showing trop of 167, K 5.4. Head CT negative for acute process.   Past Medical History  NICM, HFrEF (30-35%), afib, HTN, PAD, T2DM  Significant Hospital Events   7/26 admission 7/27 rewarmed  Consults:  Cardiology  Procedures:  7/26 ETT >> 7/26 [facility] >>  Significant Diagnostic Tests:  7/26 CXR>>pulm vascular congestion 7/26 head CT>>no acute findings 7/26 Echo>> EF 20-25%, LV with severely decreased function/ global hypokinesis LV hypertrophy, RV function is moderately reduced, RV is moderately enlarged Aortic Dilation 29mm  LTM EEG 7/26 EEG suppression, generalized  , Polyspikes, generalized                  7/27 >> EEG suppression, generalized ,continuous slow, generalized, Periodic epileptiform discharges with triphasic morphology, generalized  7/26 UDS ++ cocaine Micro Data:  7/26 blood cultures>>ng 7/26 urine culture>> 7/26 resp culture>>  Antimicrobials:  7/26 vancomycin 7/26 cefepime 7/26 flagyl  Interim history/subjective:   Remains critically ill. Levophed requirements increased  overnight. Developed atrial fibrillation with RVR For urine output. Rewarmed, water temperature  Objective   Blood pressure (!) 125/98, pulse (!) 128, temperature 98.8 F (37.1 C), resp. rate (!) 30, height 6\' 2"  (1.88 m), weight (!) 171.4 kg, SpO2 100 %. CVP:  [11 mmHg-29 mmHg] 15 mmHg  Vent Mode: PRVC FiO2 (%):  [40 %-50 %] 40 % Set Rate:  [16 bmp] 16 bmp Vt Set:  [650 mL] 650 mL PEEP:  [8 cmH20] 8 cmH20 Pressure Support:  [5 cmH20] 5 cmH20 Plateau Pressure:  [17 cmH20-24 cmH20] 24 cmH20   Intake/Output Summary (Last 24 hours) at 02/20/2020 02/22/2020 Last data filed at 02/20/2020 0600 Gross per 24 hour  Intake 2085.96 ml  Output 335 ml  Net 1750.96 ml   Filed Weights   02/16/2020 1045 02/19/20 0400 02/20/20 0347  Weight: (!) 185.1 kg (!) 168.7 kg (!) 171.4 kg    Examination: General: critically and chronically ill appearing obese male in NAD HENT: NCAT, thick neck, ETT is secure and intact Lungs: Bilateral chest excursion, no accessory muscle use, decreased breath sounds bilateral Cardiovascular: S1-S2 regular, tacky extremities cool to touch. Chronic venous stasis changes bilaterally Abdomen: obese, bs active, Body mass index is 48.52 kg/m. Extremities: no significant LE edema. Boot on left foot and wrapped Neuro: Eyes open, does not follow commands sluggish pupillary response. Does not withdraw to pain. + gag and cough, overbreathing vent Skin: chronic venous insufficiency changes on LE. No rash.   Chest x-ray personally reviewed 7/28 interstitial prominence, bibasilar infiltrates. Labs show increasing creatinine, increased glucose, leukocytosis, lactate 3.1,  decreasing LFTs  Resolved Hospital Problem list   n/a  Assessment & Plan:   Asystolic cardiac arrest/Vfib cardiac arrest. ROSC achieved around . Unclear cause at this time. Possibly precipitated by respiratory arrest as his wife heard agonal breathing prior to losing pulse. Primary concerns would be respiratory  failure vs arrhythmia vs PE vs MI vs electrolyte derangement.  NICM with HFrEF. Last echo in March 2021 showing EF of 30-35%. Heart cath 09/2019 showed mild non-obstructive disease except for severe disease in a small PL branch. Shock--cardiogenic vs septic--favor cardiogenic.  EF 20-25% UDS + for Cocaine Plan Continue Levophed, goal MAP 65 Co-ox 65% acceptable on current dose of milrinone 0.125   Paroxysmal atrial fibrillation. CHADSVASc=5. Anticoagulated on xarelto.>> Now on heparin Amiodarone added for RVR, monitor LFTs  Acute encephalopathy. Primary concern right now is hypoxic injury.  Head CT negative Plan:  Rewarmed after TTM 36, continue pads to avoid fever, use Tylenol as needed LTM EEG DC fentanyl drip, did not tolerate Precedex due to hypotension, use fentanyl as needed Further neuroprognostication to follow , currently guarded prognosis  Acute respiratory failure requiring mechanical ventilation. Unclear if this may have precipitated the cardiac arrest. Plan Not ready for spontaneous breathing trials yet but can start once Levophed requirements decreased  Sepsis -?  Aspiration Pt with history of chronic LLE wound, but doubt this is source Plan:  Broad spectrum abx coverage with vanc, cefepime -can narrow based on respiratory culture. Wound care consult   T2DM.  Plan Start levemir 10 , on NPH bid at home  SSI to moderate scale  Liver shock 2/2 cardiac arrest.  Plan LFTs trending down -Monitor while on amiodarone -Tube feeds at goal  Acute kidney injury likely ATN related to hypoperfusion. Plan  Monitor UO , avoid nephrotoxins    Best practice:  Diet: NPO Pain/Anxiety/Delirium protocol (if indicated): Fentanyl as needed, goal RASS 0 VAP protocol (if indicated): yes DVT prophylaxis: heparin gtt GI prophylaxis: pepcid Glucose control: SSI Mobility: BR Code Status: Full Family Communication: wife & father at bedside, guarded prognosis given for neurologic  recovery Disposition: ICU  The patient is critically ill with multiple organ systems failure and requires high complexity decision making for assessment and support, frequent evaluation and titration of therapies, application of advanced monitoring technologies and extensive interpretation of multiple databases. Critical Care Time devoted to patient care services described in this note independent of APP/resident  time is 35 minutes.    Cyril Mourning MD. Tonny Bollman. [facility]  If no response to pager , please call [phone]   02/20/2020

## 2020-02-20 NOTE — Progress Notes (Signed)
ANTICOAGULATION CONSULT NOTE  Pharmacy Consult for heparin Indication: atrial fibrillation  Assessment: 54 yom on Xarelto PTA for afib presenting s/p CPR with ROSC, intubated and code cool initiated. Pharmacy consulted to dose heparin for afib.   Heparin level still below goal this afternoon. Hgb very labile over the past few days so hard to really assess true trend, was 14.4 on 7/26 now down to 13.9 this am. No bleeding issues have been noted.   Goal of Therapy:  Heparin level 0.3-0.7 units/ml Monitor platelets by anticoagulation protocol: Yes   Plan:  Increase heparin to 1650 units/hr Check heparin level in 6 hours to confirm  Thanks for allowing pharmacy to be a part of this patient's care.  Sheppard Coil PharmD., BCPS Clinical Pharmacist 02/20/2020 2:07 PM

## 2020-02-20 NOTE — Progress Notes (Signed)
  Amiodarone Drug - Drug Interaction Consult Note  Recommendations: No specific interactions currently.  Amiodarone is metabolized by the cytochrome P450 system and therefore has the potential to cause many drug interactions. Amiodarone has an average plasma half-life of 50 days (range 20 to 100 days).   There is potential for drug interactions to occur several weeks or months after stopping treatment and the onset of drug interactions may be slow after initiating amiodarone.   []  Statins: Increased risk of myopathy. Simvastatin- restrict dose to 20mg  daily. Other statins: counsel patients to report any muscle pain or weakness immediately.  []  Anticoagulants: Amiodarone can increase anticoagulant effect. Consider warfarin dose reduction. Patients should be monitored closely and the dose of anticoagulant altered accordingly, remembering that amiodarone levels take several weeks to stabilize.  []  Antiepileptics: Amiodarone can increase plasma concentration of phenytoin, the dose should be reduced. Note that small changes in phenytoin dose can result in large changes in levels. Monitor patient and counsel on signs of toxicity.  []  Beta blockers: increased risk of bradycardia, AV block and myocardial depression. Sotalol - avoid concomitant use.  []   Calcium channel blockers (diltiazem and verapamil): increased risk of bradycardia, AV block and myocardial depression.  []   Cyclosporine: Amiodarone increases levels of cyclosporine. Reduced dose of cyclosporine is recommended.  []  Digoxin dose should be halved when amiodarone is started.  []  Diuretics: increased risk of cardiotoxicity if hypokalemia occurs.  []  Oral hypoglycemic agents (glyburide, glipizide, glimepiride): increased risk of hypoglycemia. Patient's glucose levels should be monitored closely when initiating amiodarone therapy.   []  Drugs that prolong the QT interval:  Torsades de pointes risk may be increased with concurrent use -  avoid if possible.  Monitor QTc, also keep magnesium/potassium WNL if concurrent therapy can't be avoided. Antibiotics: e.g. fluoroquinolones, erythromycin. . Antiarrhythmics: e.g. quinidine, procainamide, disopyramide, sotalol. . Antipsychotics: e.g. phenothiazines, haloperidol.  . Lithium, tricyclic antidepressants, and methadone.  Thank you, , PharmD, BCPS 02/20/2020 1:30 AM

## 2020-02-21 ENCOUNTER — Inpatient Hospital Stay (HOSPITAL_COMMUNITY): Payer: Medicare HMO

## 2020-02-21 ENCOUNTER — Other Ambulatory Visit: Payer: Self-pay

## 2020-02-21 DIAGNOSIS — F1410 Cocaine abuse, uncomplicated: Secondary | ICD-10-CM

## 2020-02-21 DIAGNOSIS — I469 Cardiac arrest, cause unspecified: Secondary | ICD-10-CM | POA: Diagnosis not present

## 2020-02-21 DIAGNOSIS — G40901 Epilepsy, unspecified, not intractable, with status epilepticus: Secondary | ICD-10-CM

## 2020-02-21 LAB — POCT I-STAT 7, (LYTES, BLD GAS, ICA,H+H)
Acid-Base Excess: 3 mmol/L — ABNORMAL HIGH (ref 0.0–2.0)
Bicarbonate: 28.1 mmol/L — ABNORMAL HIGH (ref 20.0–28.0)
Calcium, Ion: 1.11 mmol/L — ABNORMAL LOW (ref 1.15–1.40)
HCT: 37 % — ABNORMAL LOW (ref 39.0–52.0)
Hemoglobin: 12.6 g/dL — ABNORMAL LOW (ref 13.0–17.0)
O2 Saturation: 98 %
Patient temperature: 36.9
Potassium: 3.7 mmol/L (ref 3.5–5.1)
Sodium: 140 mmol/L (ref 135–145)
TCO2: 29 mmol/L (ref 22–32)
pCO2 arterial: 42.8 mmHg (ref 32.0–48.0)
pH, Arterial: 7.424 (ref 7.350–7.450)
pO2, Arterial: 99 mmHg (ref 83.0–108.0)

## 2020-02-21 LAB — COMPREHENSIVE METABOLIC PANEL
ALT: 176 U/L — ABNORMAL HIGH (ref 0–44)
AST: 39 U/L (ref 15–41)
Albumin: 2.4 g/dL — ABNORMAL LOW (ref 3.5–5.0)
Alkaline Phosphatase: 69 U/L (ref 38–126)
Anion gap: 15 (ref 5–15)
BUN: 56 mg/dL — ABNORMAL HIGH (ref 6–20)
CO2: 24 mmol/L (ref 22–32)
Calcium: 8.3 mg/dL — ABNORMAL LOW (ref 8.9–10.3)
Chloride: 101 mmol/L (ref 98–111)
Creatinine, Ser: 3.69 mg/dL — ABNORMAL HIGH (ref 0.61–1.24)
GFR calc Af Amer: 20 mL/min — ABNORMAL LOW (ref >60)
GFR calc non Af Amer: 17 mL/min — ABNORMAL LOW (ref >60)
Glucose, Bld: 182 mg/dL — ABNORMAL HIGH (ref 70–99)
Potassium: 3.8 mmol/L (ref 3.5–5.1)
Sodium: 140 mmol/L (ref 135–145)
Total Bilirubin: 1.3 mg/dL — ABNORMAL HIGH (ref 0.3–1.2)
Total Protein: 6.5 g/dL (ref 6.5–8.1)

## 2020-02-21 LAB — CBC
HCT: 35.7 % — ABNORMAL LOW (ref 39.0–52.0)
Hemoglobin: 11.8 g/dL — ABNORMAL LOW (ref 13.0–17.0)
MCH: 31.6 pg (ref 26.0–34.0)
MCHC: 33.1 g/dL (ref 30.0–36.0)
MCV: 95.5 fL (ref 80.0–100.0)
Platelets: 133 10*3/uL — ABNORMAL LOW (ref 150–400)
RBC: 3.74 MIL/uL — ABNORMAL LOW (ref 4.22–5.81)
RDW: 14.1 % (ref 11.5–15.5)
WBC: 13.1 10*3/uL — ABNORMAL HIGH (ref 4.0–10.5)
nRBC: 0.2 % (ref 0.0–0.2)

## 2020-02-21 LAB — COOXEMETRY PANEL
Carboxyhemoglobin: 1.3 % (ref 0.5–1.5)
Methemoglobin: 1 % (ref 0.0–1.5)
O2 Saturation: 84.9 %
Total hemoglobin: 12.1 g/dL (ref 12.0–16.0)

## 2020-02-21 LAB — PHOSPHORUS: Phosphorus: 5.9 mg/dL — ABNORMAL HIGH (ref 2.5–4.6)

## 2020-02-21 LAB — PREPARE RBC (CROSSMATCH)

## 2020-02-21 LAB — POTASSIUM: Potassium: 3.8 mmol/L (ref 3.5–5.1)

## 2020-02-21 LAB — GLUCOSE, CAPILLARY
Glucose-Capillary: 148 mg/dL — ABNORMAL HIGH (ref 70–99)
Glucose-Capillary: 149 mg/dL — ABNORMAL HIGH (ref 70–99)
Glucose-Capillary: 161 mg/dL — ABNORMAL HIGH (ref 70–99)
Glucose-Capillary: 163 mg/dL — ABNORMAL HIGH (ref 70–99)
Glucose-Capillary: 166 mg/dL — ABNORMAL HIGH (ref 70–99)
Glucose-Capillary: 169 mg/dL — ABNORMAL HIGH (ref 70–99)

## 2020-02-21 LAB — URINE CULTURE: Culture: NO GROWTH

## 2020-02-21 LAB — HEPARIN LEVEL (UNFRACTIONATED): Heparin Unfractionated: 0.32 IU/mL (ref 0.30–0.70)

## 2020-02-21 LAB — MAGNESIUM: Magnesium: 1.7 mg/dL (ref 1.7–2.4)

## 2020-02-21 MED ORDER — VALPROATE SODIUM 500 MG/5ML IV SOLN
2000.0000 mg | Freq: Once | INTRAVENOUS | Status: AC
  Administered 2020-02-21: 2000 mg via INTRAVENOUS
  Filled 2020-02-21: qty 20

## 2020-02-21 MED ORDER — CALCIUM GLUCONATE-NACL 1-0.675 GM/50ML-% IV SOLN
1.0000 g | Freq: Once | INTRAVENOUS | Status: AC
  Administered 2020-02-21: 1000 mg via INTRAVENOUS
  Filled 2020-02-21: qty 50

## 2020-02-21 MED ORDER — LORAZEPAM 2 MG/ML IJ SOLN
2.0000 mg | INTRAMUSCULAR | Status: AC
  Administered 2020-02-21: 2 mg via INTRAVENOUS

## 2020-02-21 MED ORDER — MAGNESIUM SULFATE 2 GM/50ML IV SOLN
2.0000 g | Freq: Once | INTRAVENOUS | Status: AC
  Administered 2020-02-21: 2 g via INTRAVENOUS
  Filled 2020-02-21: qty 50

## 2020-02-21 MED ORDER — VALPROIC ACID 250 MG/5ML PO SOLN
500.0000 mg | Freq: Two times a day (BID) | ORAL | Status: DC
  Administered 2020-02-21 – 2020-02-27 (×12): 500 mg via ORAL
  Filled 2020-02-21 (×13): qty 10

## 2020-02-21 MED ORDER — POTASSIUM CHLORIDE 10 MEQ/50ML IV SOLN
10.0000 meq | Freq: Once | INTRAVENOUS | Status: AC
  Administered 2020-02-21: 10 meq via INTRAVENOUS
  Filled 2020-02-21: qty 50
  Filled 2020-02-21: qty 100

## 2020-02-21 NOTE — Progress Notes (Signed)
eLink Physician-Brief Progress Note Patient Name: Maurice Nguyen DOB: 10/28/1965 MRN: [id number]   Date of Service  02/21/2020  HPI/Events of Note  Low mag, ion calcium and K 3.7  eICU Interventions  Kcl 10 meq IV/Mag 2 gm IV and Calcium gluconate 1 gm ordered.      Intervention Category Intermediate Interventions: Electrolyte abnormality - evaluation and management  Ranee Gosselin 02/21/2020, 6:33 AM

## 2020-02-21 NOTE — Progress Notes (Signed)
Patient was transported to MRI & back on the ventilator with no problems.

## 2020-02-21 NOTE — Progress Notes (Signed)
Chaplain engaged in follow-up visit with Rohin's father, Mr. Stoneking Sr.  During visit, chaplain worked to get to know Mr. Mckillop and Rudie through his father's perspective. Mr. Quinney immediately spoke of how smart his son was in high school.  He stated that Chrissie Noa finished all of his high school credits much earlier than his peers and siblings but remained at school to graduate with his class. Mr. Strahm also detailed creating a childhood for Lavaughn, known as Isaias Cowman by those closest to him, and his siblings that was filled with yearly vacations, family, love, security and stability.    Mr. Mogel also shared how close he is to Vyom, who is his youngest son.  He stated that he spoke to [place] every single day.  They were able to bond over football and would often call each other when games were happening to discuss scores and engage in banter.  He expressed some grief over not being able to call him recently as he watched a football game.   Within hearing Mr. Livecchi story of his upbringing, time in the army, going to [employer], working within the [employer], and raising his children, he also detailed that he has experienced losing a child before.    Chaplain offered the ministries of presence, listening and prayer.  Mr. Jarnigan was able to verbalize to chaplain that his son is not doing well today, stating that he has squeezed or raised up his hands in the past and that now there is no movement or awareness.  He also was clear about his faith in God, "God's will," and God's ability to heal his son.  Chaplain wanted to offer Mr. Salemi the space to just talk.  He spent time recounting his life and experiences intertwined with the love he has for his children, and specifically for Luisalberto at this time.    Chaplain will continue to follow-up and offer care and support.

## 2020-02-21 NOTE — Progress Notes (Signed)
Progress Note  Patient Name: Maurice Nguyen Date of Encounter: 02/21/2020  [facility] Cardiologist: Mertie Moores, MD    Subjective   54 year old gentleman with a history of morbid obesity, diabetes mellitus was admitted 7/26  with cardiac arrest.  UDS is positive for cocaine.  Is no longer having bradycardia.  Is in atrial flutter with rapid V response BP is still marginal   CVP is  9-10 Urine output is low.   Received IV NS bolus overnight    Creatinine is up to 3.75  Co-ox is 65 Opens his eyes No purposefull responses   EEG shows signs of anoxic brain injury.  Remains unresponsive   Inpatient Medications    Scheduled Meds: . chlorhexidine gluconate (MEDLINE KIT)  15 mL Mouth Rinse BID  . Chlorhexidine Gluconate Cloth  6 each Topical Daily  . docusate  100 mg Per Tube BID  . feeding supplement (PROSource TF)  90 mL Per Tube TID  . insulin aspart  0-15 Units Subcutaneous Q4H  . insulin detemir  10 Units Subcutaneous Daily  . LORazepam  2 mg Intravenous STAT  . mouth rinse  15 mL Mouth Rinse 10 times per day  . pantoprazole (PROTONIX) IV  40 mg Intravenous Q12H  . polyethylene glycol  17 g Per Tube Daily  . sodium chloride flush  10-40 mL Intracatheter Q12H  . sodium chloride flush  3 mL Intravenous Q12H   Continuous Infusions: . sodium chloride    . sodium chloride Stopped (02/20/20 0021)  . sodium chloride    . amiodarone 30 mg/hr (02/21/20 0634)  . calcium gluconate    . ceFEPime (MAXIPIME) IV Stopped (02/20/20 2304)  . dexmedetomidine (PRECEDEX) IV infusion 0.05 mcg/kg/hr (02/21/20 3299)  . feeding supplement (VITAL AF 1.2 CAL) Stopped (02/20/20 1029)  . fentaNYL infusion INTRAVENOUS Stopped (02/21/20 0559)  . heparin 1,650 Units/hr (02/21/20 2426)  . levETIRAcetam Stopped (02/20/20 2212)  . milrinone 0.125 mcg/kg/min (02/21/20 0634)  . norepinephrine (LEVOPHED) Adult infusion 41 mcg/min (02/21/20 0634)  . valproate sodium     PRN  Meds: sodium chloride, acetaminophen (TYLENOL) oral liquid 160 mg/5 mL, docusate, fentaNYL (SUBLIMAZE) injection, LORazepam, polyethylene glycol, sodium chloride flush, sodium chloride flush   Vital Signs    Vitals:   02/21/20 0645 02/21/20 0700 02/21/20 0811 02/21/20 0821  BP: 125/66 121/73    Pulse: 97     Resp: 16     Temp:  98.6 F (37 C) 98.4 F (36.9 C)   TempSrc:  Bladder Bladder   SpO2: 100%   100%  Weight:      Height:        Intake/Output Summary (Last 24 hours) at 02/21/2020 0927 Last data filed at 02/21/2020 8341 Gross per 24 hour  Intake 2672.42 ml  Output 2560 ml  Net 112.42 ml   Last 3 Weights 02/21/2020 02/20/2020 02/19/2020  Weight (lbs) 370 lb 6 oz 377 lb 13.9 oz 371 lb 14.7 oz  Weight (kg) 168 kg 171.4 kg 168.7 kg      Telemetry    afib with RVR  ,  Episodes of nonsustained VT  - Personally Reviewed  ECG     - Personally Reviewed  Physical Exam   Physical Exam: Blood pressure 121/73, pulse 97, temperature 98.4 F (36.9 C), temperature source Bladder, resp. rate 16, height _0  (1.88 m), weight (!) 168 kg, SpO2 100 %.  GEN:   Middle age male,  On vent , unresponsive, obese  HEENT: Normal  NECK: No JVD; No carotid bruits LYMPHATICS: No lymphadenopathy CARDIAC:  Irreg. Irreg. , tachy RESPIRATORY:  Clear to auscultation without rales, wheezing or rhonchi  ABDOMEN: Soft, non-tender, non-distended MUSCULOSKELETAL:  No edema; No deformity  SKIN: Warm and dry NEUROLOGIC:  Unresponsive    Labs    High Sensitivity Troponin:   Recent Labs  Lab 02/16/2020 1014 01/24/2020 1627  TROPONINIHS 169* 7,310*      Chemistry Recent Labs  Lab 02/11/2020 1014 01/25/2020 1022 02/19/20 0412 02/19/20 0414 02/20/20 0405 02/20/20 0405 02/21/20 0221 02/21/20 0518 02/21/20 0525  NA 136   < > 138   < > 138  --  140  --  140  K 5.5*   < > 4.5   < > 4.7   < > 3.8 3.8 3.7  CL 101   < > 103  --  100  --  101  --   --   CO2 17*   < > 19*  --  22  --  24  --   --    GLUCOSE 306*   < > 211*  --  243*  --  182*  --   --   BUN 25*   < > 38*  --  49*  --  56*  --   --   CREATININE 1.61*   < > 2.74*  --  3.75*  --  3.69*  --   --   CALCIUM 8.2*   < > 8.3*  --  8.3*  --  8.3*  --   --   PROT 6.1*  --   --   --  6.8  --  6.5  --   --   ALBUMIN 2.9*  --   --   --  2.7*  --  2.4*  --   --   AST 785*  --   --   --  95*  --  39  --   --   ALT 641*  --   --   --  275*  --  176*  --   --   ALKPHOS 124  --   --   --  82  --  69  --   --   BILITOT 1.7*  --   --   --  1.4*  --  1.3*  --   --   GFRNONAA 47*   < > 25*  --  17*  --  17*  --   --   GFRAA 55*   < > 29*  --  20*  --  20*  --   --   ANIONGAP 18*   < > 16*  --  16*  --  15  --   --    < > = values in this interval not displayed.     Hematology Recent Labs  Lab 02/19/20 0412 02/19/20 0414 02/20/20 0405 02/20/20 0405 02/20/20 2335 02/21/20 0221 02/21/20 0525  WBC 17.8*  --  20.3*  --   --  13.1*  --   RBC 4.76  --  4.37  --   --  3.74*  --   HGB 14.9   < > 13.9   < > 11.7* 11.8* 12.6*  HCT 46.1   < > 42.3   < > 35.3* 35.7* 37.0*  MCV 96.8  --  96.8  --   --  95.5  --   MCH 31.3  --  31.8  --   --  31.6  --   MCHC 32.3  --  32.9  --   --  33.1  --   RDW 13.9  --  14.1  --   --  14.1  --   PLT 176  --  149*  --   --  133*  --    < > = values in this interval not displayed.    BNP Recent Labs  Lab 01/28/2020 1030  BNP 158.1*     DDimer No results for input(s): DDIMER in the last 168 hours.   Radiology    DG Abd 1 View  Result Date: 02/20/2020 CLINICAL DATA:  Orogastric tube placement EXAM: ABDOMEN - 1 VIEW COMPARISON:  10/17/2019 FINDINGS: Examination is limited secondary to poor penetration from patient body habitus. Enteric tube courses below the diaphragm with distal tip and side port terminating within the level of the gastric body. Included bowel gas pattern appears nonobstructive. IMPRESSION: Enteric tube courses below the diaphragm with distal tip and side port terminating at the  level of the gastric body. Electronically Signed   By: Davina Poke D.O.   On: 02/20/2020 15:14   DG Chest Port 1 View  Result Date: 02/21/2020 CLINICAL DATA:  Intubation.  Respiratory failure. EXAM: PORTABLE CHEST 1 VIEW COMPARISON:  02/20/2020. FINDINGS: Endotracheal tube, left IJ line, NG 2 in stable position. Again noted is cardiomegaly with pulmonary venous congestion and bilateral interstitial prominence consistent with CHF. Small left pleural effusion. Similar findings noted on prior exam no pneumothorax. IMPRESSION: 1.  Lines and tubes stable position. 2. Again noted is cardiomegaly with pulmonary venous congestion, bilateral interstitial prominence, and small left pleural effusion. Findings consistent with CHF. Similar findings noted on prior exam. Electronically Signed   By: Flanagan   On: 02/21/2020 07:36   DG CHEST PORT 1 VIEW  Result Date: 02/20/2020 CLINICAL DATA:  Possible aspiration. EXAM: PORTABLE CHEST 1 VIEW COMPARISON:  02/20/2020 FINDINGS: NG tube enters the stomach. Left central line is unchanged. Cardiomegaly with vascular congestion. Bilateral lower lobe airspace opacities are similar to prior study. No acute bony abnormality. IMPRESSION: Stable cardiomegaly with vascular congestion and bilateral lower lobe atelectasis or infiltrates. Electronically Signed   By: Rolm Baptise M.D.   On: 02/20/2020 11:27   DG CHEST PORT 1 VIEW  Result Date: 02/20/2020 CLINICAL DATA:  Endotracheal tube present. EXAM: PORTABLE CHEST 1 VIEW COMPARISON:  February 19, 2020. FINDINGS: Stable cardiomegaly. Nasogastric tube is seen entering the stomach. Endotracheal tube is partially visualized. Left internal jugular catheter is unchanged. No pneumothorax is noted. Central pulmonary vascular congestion is noted with bibasilar atelectasis or edema. Bony thorax is unremarkable. IMPRESSION: Stable cardiomegaly. Central pulmonary vascular congestion is noted with bibasilar atelectasis or edema. Stable  support apparatus. No pneumothorax is noted. Electronically Signed   By: Marijo Conception M.D.   On: 02/20/2020 08:12    Cardiac Studies     Patient Profile     54 y.o. male with acute on chronic combined CHF admitted after cardiac arrest.    Assessment & Plan    1.   Cardiac arrest:   In the setting of cocaine abuse Has been rewarmed. Cont current support  EEG stil in progress.   2.   acute on chronic combined systolic and diastolic congestive heart failure: The patient was noted to have an EF of 30 to 35% in March, 2021.  Echocardiogram from yesterday reveals EF of 20 to 25%.  There is global hypokinesis.  We  were unable to evaluate diastolic function.  The right ventricle is moderately enlarged.  There is mild dilatation of the aortic root at 41 mm.  Co-ox has improved on milrinone. Cont supportive cre   3.   Respiratory failure:   Appreciate PCCM's assistance with this .   4.  Acute kidney injury:  Creatinine continues to increase Making very little urine  Further management per PCCM   5.  Non sustained VT:   Cont amio. Given his anoxic brain injury, it sounds like his long term prognosis is poor .        For questions or updates, please contact [place] Please consult [email or web address] for contact info under        Signed, Mertie Moores, MD  02/21/2020, 9:27 AM

## 2020-02-21 NOTE — Progress Notes (Addendum)
°  Follow up conversation with family (pt partner [name] and pt father Garion Wempe.)  Patient's partner, [name], is increasingly frustrated regarding established hospital visitation policies. I have offered emotional support with visitation constraints but frustrations have continued to escalate. [name] refuses to further discuss at this time unless numerous additional family members can be present in person, and is refusing video conferencing and phone calls with family members-- offered multiple times by myself.   I will discuss these developments with Palliative Care, who has been consulted and likely will see 7/30  -Remains Full Code, full scope at this time _____  [facility] Family Communication Note  Updated patient's significant other and father at bedside. Discussed unfortunate findings of EEG, concerns for anoxic injury and poor prognosis. Discussed multisystem organ failure.   We discussed plan for MRI, which may not be feasible due to worsening shock.   Family understandably expresses grief over news/updates, and frustration (particularly regarding visitation constraints and possibility of acuity of illness precluding ability to safely obtain imaging).   Family expresses wish for other family members to be included in decision making process  & father expresses faith in [place] provision. I approached the topic of GOC/ QOL, but goals and acceptable outcomes are not clear at this time.   I will consult palliative care and ask for assistance.  I will also plan to re-visit with family later today in hopes of addressing code status.     Tessie Fass MSN, AGACNP-BC [place] [facility] [phone] If no answer, [phone] 02/21/2020, 11:24 AM

## 2020-02-21 NOTE — Progress Notes (Signed)
NAME:  Maurice Nguyen, MRN:  [id number], DOB:  1965-11-20, LOS: 3 ADMISSION DATE:  02/20/2020, CONSULTATION DATE:  02/17/2020 REFERRING MD:  Renaye Rakers, CHIEF COMPLAINT:  Cardiac arrest  Brief History   54yom s/p asystolic cardiac arrest. downtime.   History of present illness   54yom with NICM, HFrEF, afib, HTN, T2DM who presented via EMS s/p cardiac arrest. Wife notes that she woke up around 0700 at which time her husband was ok. They got up and ate some breakfast and when back to sleep. When she woke back up around 0730, she noted that he had agonal breathing and was non-responsive. EMS was called and pt was noted to have a pulse at that time. Shortly thereafter, he developed asystole, required 8 rounds of cpr. He transitioned into vfib arrest which responded appropriately to one shock. On arrival to the ED, he was noted to be hypothermic with temp of 83.50F. Labs showing trop of 167, K 5.4. Head CT negative for acute process.   Past Medical History  NICM, HFrEF (30-35%), afib, HTN, PAD, T2DM  Significant Hospital Events   7/26 admission 7/27 rewarmed 7/28 worsening epileptiform discharges -- concerning for anoxic brain injury  Consults:  Cardiology Neurology   Procedures:  7/26 ETT >> 7/26 [facility] >>  Significant Diagnostic Tests:  7/26 CXR>>pulm vascular congestion 7/26 head CT>>no acute findings 7/26 Echo>> EF 20-25%, LV with severely decreased function/ global hypokinesis LV hypertrophy, RV function is moderately reduced, RV is moderately enlarged Aortic Dilation 66mm  LTM EEG 7/26 EEG suppression, generalized  , Polyspikes, generalized                  7/27 >> EEG suppression, generalized ,continuous slow, generalized, Periodic epileptiform discharges with triphasic morphology, generalized       7/28 >> worsening epileptiform discharges   7/26 UDS ++ cocaine Micro Data:  7/26 blood cultures>>ng 7/26 urine culture>> 7/26 resp culture>> abundant WBC 7/26 MRSA > neg    Antimicrobials:  7/26 vancomycin - 7/29 7/26 cefepime >  7/26 flagyl  Interim history/subjective:   Yesterday, worsening epileptiform discharges captured on EEG   Having brief runs of VT. Electrolytes optimized overnight. Remains on amio gtt.   Remains on NE, milrinone, on minimal precedex and intubated  Does initiate spontaneous breaths  Coox improvement this morning with scvo2 85% from 65%  Objective   Blood pressure 121/73, pulse 97, temperature 98.6 F (37 C), temperature source Bladder, resp. rate 16, height 6\' 2"  (1.88 m), weight (!) 168 kg, SpO2 100 %. CVP:  [0 mmHg-16 mmHg] 4 mmHg  Vent Mode: PRVC FiO2 (%):  [40 %] 40 % Set Rate:  [16 bmp] 16 bmp Vt Set:  [650 mL] 650 mL PEEP:  [5 cmH20-8 cmH20] 5 cmH20 Plateau Pressure:  [14 cmH20-19 cmH20] 15 cmH20   Intake/Output Summary (Last 24 hours) at 02/21/2020 0742 Last data filed at 02/21/2020 02/23/2020 Gross per 24 hour  Intake 2882.22 ml  Output 2735 ml  Net 147.22 ml   Filed Weights   02/19/20 0400 02/20/20 0347 02/21/20 0400  Weight: (!) 168.7 kg (!) 171.4 kg (!) 168 kg    Examination: General: Morbidly obese middle aged M, critically ill appearing on pressors, mechanical ventilation, LTM  HENT: NCAT. EEG on place. ETT secure. OGT with brown colored output.  Lungs: Diminished bilateral breath sounds. Mechanically ventilated. No adventitious sounds  Cardiovascular: irir, s1s2. Cap refill < 3 seconds.  Abdomen: Obese, round, soft  Extremities: No obvious joint deformity.  No cyanosis or clubbing  Neuro: Rhythmic eyelid twitching. Does not follow commands. PERRL 13mm. Does not track. Overbreathing vent +gag  + cough  Skin: Cutaneous changes from venous insufficiency. Skin warm dry. Arctic sun pads in place   Resolved Hospital Problem list   n/a  Assessment & Plan:   Asystolic cardiac arrest/Vfib cardiac arrest. ROSC achieved around . Unclear cause at this time. Possibly precipitated by respiratory arrest as his  wife heard agonal breathing prior to losing pulse. Primary concerns would be respiratory failure vs arrhythmia vs PE vs MI vs electrolyte derangement.  NICM with HFrEF. Last echo in March 2021 showing EF of 30-35%. Heart cath 09/2019 showed mild non-obstructive disease except for severe disease in a small PL branch. Shock--cardiogenic vs septic--favor cardiogenic.  EF 20-25% UDS + for Cocaine 7/28 Coox improvement from 65% to 85% on 0.125 milrinone  Increasing titration of NE  Plan Continue NE for MAP > 65 On Milrinone 0.125. Coox has improved from 65% to 84.9%; Consider weaning milrinone  Paroxysmal atrial fibrillation. CHADSVASc=5. Anticoagulated on xarelto Intermittent VTach  Plan Continue heparin Continue amio Mag goal > 2, K goal >4 Trene BMP mag ionized calcium   Acute encephalopathy.  Primary concern right now is hypoxic injury.  Head CT negative Epileptiform discharges   On EEG. Neurology has discussed c/f anoxic injury with family.  Plan:  Appreciate neurology assistance Keppra  LTM EEG MRI brain wo  On minimal precedex    Acute respiratory failure requiring intubation Unclear if this may have precipitated the cardiac arrest. Plan MV support VAP, PAD  Does initiate spontaneous breaths, have not pursued SBTs yet-- awaiting MRI brain   Sepsis -?  Aspiration Pt with history of chronic LLE wound, but doubt this is source Plan:  Continue cefepime empirically for suspected aspiration PNA. Dc vanc. Flagyl previously discontinued  WOC  Transaminitis due to shock liver In setting of cardiac arrest  Down-trending LFTs Plan Trend LFTs, coags PRN  Acute kidney injury likely ATN related to hypoperfusion. Cr uptrending but continues to make urine, no indication for RRT at present Plan  Continue foley catheter with strict I/O  Trend renal indices   Hypocalcemia Borderline hypokalemia Plan S/p replacement   T2DM.  Plan SSI + levemir   Best practice:  Diet:  NPO Pain/Anxiety/Delirium protocol (if indicated): Precedex PRN ativan PRN fent  VAP protocol (if indicated): yes DVT prophylaxis: heparin gtt GI prophylaxis: pepcid Glucose control: SSI + basal  Mobility: BR Code Status: Full Family Communication: pending 7/28 Disposition: ICU  CRITICAL CARE Performed by: Lanier Clam   Total critical care time: 40 minutes  Critical care time was exclusive of separately billable procedures and treating other patients.  Critical care was necessary to treat or prevent imminent or life-threatening deterioration.  Critical care was time spent personally by me on the following activities: development of treatment plan with patient and/or surrogate as well as nursing, discussions with consultants, evaluation of patient's response to treatment, examination of patient, obtaining history from patient or surrogate, ordering and performing treatments and interventions, ordering and review of laboratory studies, ordering and review of radiographic studies, pulse oximetry and re-evaluation of patient's condition.  Tessie Fass MSN, AGACNP-BC [facility] [phone] If no answer, [phone] 02/21/2020, 7:43 AM

## 2020-02-21 NOTE — Progress Notes (Cosign Needed)
NAME:  Maurice Nguyen, MRN:  [id number], DOB:  March 26, 1966, LOS: 3 ADMISSION DATE:  02/11/2020, CONSULTATION DATE:  02/08/2020 REFERRING MD:  Renaye Rakers, CHIEF COMPLAINT:  Cardiac arrest  Brief History   54yom s/p asystolic cardiac arrest. downtime.   History of present illness   54yom with NICM, HFrEF, afib, HTN, T2DM who presented via EMS s/p cardiac arrest. Wife notes that she woke up around 0700 at which time her husband was ok. They got up and ate some breakfast and when back to sleep. When she woke back up around 0730, she noted that he had agonal breathing and was non-responsive. EMS was called and pt was noted to have a pulse at that time. Shortly thereafter, he developed asystole, required 8 rounds of cpr. He transitioned into vfib arrest which responded appropriately to one shock. On arrival to the ED, he was noted to be hypothermic with temp of 83.12F. Labs showing trop of 167, K 5.4. Head CT negative for acute process.   Past Medical History  NICM, HFrEF (30-35%), afib, HTN, PAD, T2DM  Significant Hospital Events   7/26 admission 7/27 rewarmed  Consults:  Cardiology Neurology  Procedures:  7/26 ETT >> 7/26 [facility] >>  Significant Diagnostic Tests:  7/29 CXR >> cardiomegaly with pulmonary venous congestion, bilateral interstitial prominence, and small left pleural effusion. Findings consistent with CHF. Similar findings noted on prior exam.  7/26 Head CT>>no acute findings  7/26 Echo>> EF 20-25%, LV with severely decreased function/ global hypokinesis LV hypertrophy, RV function is moderately reduced, RV is moderately enlarged. Aortic Dilation 67mm  7/26 EEG > Suppression, generalized  , Polyspikes, generalized 7/27 EEG > Suppression, generalized ,continuous slow, generalized, Periodic epileptiform discharges with triphasic morphology, generalized  7/26 UDS ++ cocaine  Micro Data:  7/26 blood cultures>> NGTD x 3 7/26 urine culture>> Awaiting 7/26 resp culture>> Rare  gram negative and variable rods 7/27 MRSA PCR >> Negative  Antimicrobials:  Vancomycin 7/26 > Cefepime 7/26 > Flagyl 7/26 > 7/26   Interim history/subjective:  EEG showing evidence of epileptogenicity and diffuse encephalopathy.  Had to have tube feeding held for vomiting. Additionally, he was hypotensive with increased norepinephrine requirement. Replaced electrolytes this morning following some ectopy.  Objective   Blood pressure 121/73, pulse 97, temperature 98.6 F (37 C), temperature source Bladder, resp. rate 16, height 6\' 2"  (1.88 m), weight (!) 168 kg, SpO2 100 %. CVP:  [0 mmHg-16 mmHg] 4 mmHg  Vent Mode: PRVC FiO2 (%):  [40 %] 40 % Set Rate:  [16 bmp] 16 bmp Vt Set:  [650 mL] 650 mL PEEP:  [5 cmH20-8 cmH20] 5 cmH20 Plateau Pressure:  [14 cmH20-19 cmH20] 15 cmH20   Intake/Output Summary (Last 24 hours) at 02/21/2020 0740 Last data filed at 02/21/2020 02/23/2020 Gross per 24 hour  Intake 2882.22 ml  Output 2735 ml  Net 147.22 ml   Filed Weights   02/19/20 0400 02/20/20 0347 02/21/20 0400  Weight: (!) 168.7 kg (!) 171.4 kg (!) 168 kg    Examination: General: Obese african american male laying in bed on MV HENT: NCAT. ETT in place 27 ATT. MMM. Non-icteric sclera.  Lungs: Bilateral rhonchi throughout. No accessory muscle use. No crepitus Cardiovascular: Heart tones auscultated. No murmurs or rubs. Appropriate arterial waveform on monitor, normotensive on vasoactives. Irregular rhythm on monitor Abdomen: Obese, not distended, not tender to palpation. Soft Extremities: No joint deformities, normal muscle tone and bulk Neuro: Eyes closed. Does not withdraw to pain. No corneal response. PERRL  Skin: Appropriate for ethnicity, warm, dry, no obvious bleeding or bruising.  Resolved Hospital Problem list   N/A  Assessment & Plan:   Asystolic cardiac arrest/Vfib cardiac arrest NICM with HFrEF Shock, undifferentiated ROSC achieved after 30 minutes CPR. Last echo in March 2021  showing EF 30-35%. Heart cath in March showed mild non-obstructing disease except for severe disease in a small PL branch. Etiology of shock likely cardiogenic vs septic. His ScVO2 is 84.9 today but is requiring increasing amounts of norepinephrine so I am inclined to think this is more of a septic process. -Continue norepinephrine for MAP goal >65 -Would consider trying him off milrinone given improvement in ScVO2. -Cardiology consulted. No need for cath given non-obstructing CAD in March -Given hemodynamic instability with increasing vasopressor requirements, continue to hold home ARNI, BB, and loop.  Paroxysmal atrial fibrillation.  CHADSVASc=4. Anticoagulated on rivaroxaban at home.  Worsening rates into 160's on 7/28. -Continue heparin while inpatient -Continue amiodarone infusion per Cardiology  Acute encephalopathy Primary concern right now is hypoxic injury. Head CT was negative although EEG has showed some diffuse encephalopathy concerning for hypoxia/anoxia. We need to proceed with an MRI to evaluate to establish GOC. -Continue arctic sun pads to avoid fevers. Would avoid tylenol in setting of transaminitis -Continue LTM EEG -Would discontinue dexmedetomidine and favor using fentanyl pushes PRN for agitation / dyssynchrony -Will place order for MRI Head  Acute respiratory failure requiring mechanical ventilation Unclear if this may have precipitated the cardiac arrest given non-obstructing CAD and history of HFrEF -Not ready for spontaneous breathing trials given suspected anoxic injury -VAP bundle -Wean to minimal settings as tolerated  Suspected sepsis  Unsure of etiology, questionable aspiration event which precipitated cardiac arrest. Blood cultures have NGTD but respiratory culture showing gram negative rods. He has a chronic LLE wound but doubtful source. -Wound care consult for LLE wound -Continue cefepime 7-10 days -Discontinue Vanc given MRSA negative -Follow fever  curve and WBC  T2DM.  Hemoglobin A1c 8.1. He takes NPH BID at home. -Continue basal and SSI -Consider diabetes coordinator if he survives this hospitalization  Transaminitis Improving. Likely secondary to shock liver in setting of cardiac arrest. -Monitor while on amiodarone -Tube feeds at goal  Acute kidney injury  Likely ATN related to hypoperfusion in setting of cardiac arrest.  -Monitor UO. He has had 2.7L output / 24 hours -Avoid nephrotoxins -Trend renal indices -Will assess for return of renal function. Would need better neuroprognostication and GOC discussion with family before considering dialyzing   Hypomagnesemia Hypocalcemia -Given 4g mag today, recheck tomorrow morning or with continued ectopy -Given 2g calcium for hypocalcemia   Goals for today: -Remove milrinone -Obtain MRI -GOC discussion once MRI results obtained -Wean norepinephrine as tolerated  Best practice:  Diet: Vital 1.2 TF Pain/Anxiety/Delirium protocol (if indicated): Fentanyl PRN VAP protocol (if indicated): Yes DVT prophylaxis: Heparin drip GI prophylaxis: Protonix Glucose control: SSI + Basal Mobility: BR Code Status: Full Family Communication: Pending Disposition: ICU  Rise Mu, AGACNP Student, 02/21/2020 0900

## 2020-02-21 NOTE — Progress Notes (Signed)
LTM EEG discontinued - no skin breakdown at unhook.   

## 2020-02-21 NOTE — Progress Notes (Signed)
Chaplain engaged in follow-up visit with [name], [name], and his partner, Maurice Nguyen.  Maurice Nguyen and Maurice Nguyen voiced the need for a medical professional to have an in-person conversation with them and Maurice Nguyen's siblings around making decisions regarding Maurice Nguyen's health and quality of life.  They are essentially requesting a "family meeting" that will allow them to process decisions together within a consult or conference room.  They prefer to do a meeting after 6:00 pm or on the weekend.  Maurice Nguyen would like to have a discussion soon in recognition of Maurice Nguyen's current condition.  Both Maurice Nguyen and Maurice Nguyen voiced that utilizing technology is challenging for them and they do not understand video-chatting.  Chaplain could assess that Maurice Nguyen and Maurice Nguyen are at a point of frustration and grief concerning making major decisions and are seeking out family support.    Maurice Nguyen was also apologetic of her reaction towards NP Delorise Shiner, acknowledging that she had come to a place of frustration.  Chaplain offered support and understanding.  Chaplain also let them know that they could speak with palliative care tomorrow to obtain more clarity about obtaining a "family meeting."  Chaplain will follow-up and is willing to be present with family.

## 2020-02-21 NOTE — Progress Notes (Signed)
eLink Physician-Brief Progress Note Patient Name: Maurice Nguyen DOB: 1965/09/29 MRN: [id number]   Date of Service  02/21/2020  HPI/Events of Note  Frequent ectopy. Early AM from 2 was ok.   eICU Interventions  Get Potassium, Mag, po4 , ion calcium level.     Intervention Category Intermediate Interventions: Arrhythmia - evaluation and management  Ranee Gosselin 02/21/2020, 5:06 AM

## 2020-02-21 NOTE — Progress Notes (Signed)
eLink Physician-Brief Progress Note Patient Name: Maurice Nguyen DOB: [birth year] MRN: [id number]   Date of Service  02/21/2020  HPI/Events of Note  Hg 11.6, down from 16 to 13 now to < 11.6 [place] emesis earlier. On pressors. Risk for Upper GI bleeding.   eICU Interventions  - follow Hg - Type and screen/cross match one unit. Notified bed side RN.      Intervention Category Intermediate Interventions: Other:  Ranee Gosselin 02/21/2020, 12:21 AM

## 2020-02-21 NOTE — Progress Notes (Addendum)
Subjective: Patient had worsening seizures this morning.  Discussed this with patient's significant other and father at bedside.   ROS: Unable to obtain due to poor mental status  Examination  Vital signs in last 24 hours: Temp:  [98.1 F (36.7 C)-98.6 F (37 C)] 98.4 F (36.9 C) (07/29 0811) Pulse Rate:  [37-131] 97 (07/29 0645) Resp:  [7-22] 16 (07/29 0645) BP: (80-168)/(47-131) 121/73 (07/29 0700) SpO2:  [100 %] 100 % (07/29 0821) Arterial Line BP: (73-165)/(42-77) 103/53 (07/29 0645) FiO2 (%):  [40 %] 40 % (07/29 0821) Weight:  [938 kg] 168 kg (07/29 0400)  General: lying in bed, not in apparent distress CVS: pulse-normal rate and rhythm RS: intubated, initiating spontaneous breaths Extremities: normal, warm Neuro: Comatose, does not open eyes to noxious stimuli, pupils equally round and reactive, corneal reflex intact, gag reflex intact, withdraws to noxious stimuli in bilateral upper extremities, no movement in bilateral lower extremities.  Basic Metabolic Panel: Recent Labs  Lab 01/24/2020 1627 02/02/2020 1627 02/06/2020 2341 02/09/2020 2341 02/19/20 0412 02/19/20 0412 02/19/20 0414 02/20/20 0405 02/20/20 0905 02/21/20 0221 02/21/20 0518 02/21/20 0525  NA 138   < > 137   < > 138  --  140 138  --  140  --  140  K 3.7   < > 4.2   < > 4.5   < > 4.4 4.7  --  3.8 3.8 3.7  CL 101  --  103  --  103  --   --  100  --  101  --   --   CO2 23  --  19*  --  19*  --   --  22  --  24  --   --   GLUCOSE 305*  --  229*  --  211*  --   --  243*  --  182*  --   --   BUN 35*  --  34*  --  38*  --   --  49*  --  56*  --   --   CREATININE 2.08*  --  2.48*  --  2.74*  --   --  3.75*  --  3.69*  --   --   CALCIUM 8.3*   < > 8.0*   < > 8.3*  --   --  8.3*  --  8.3*  --   --   MG  --   --   --   --  1.7  --   --   --  1.8  --  1.7  --   PHOS  --   --   --   --  4.1  --   --   --   --   --  5.9*  --    < > = values in this interval not displayed.    CBC: Recent Labs  Lab 02/04/2020 1014  02/19/2020 1022 02/06/2020 1741 02/21/2020 1741 02/19/20 0412 02/19/20 0412 02/19/20 0414 02/20/20 0405 02/20/20 2335 02/21/20 0221 02/21/20 0525  WBC 26.4*  --  18.0*  --  17.8*  --   --  20.3*  --  13.1*  --   NEUTROABS 15.7*  --   --   --   --   --   --   --   --   --   --   HGB 14.5   < > 14.4   < > 14.9   < > 16.0 13.9 11.7* 11.8* 12.6*  HCT 46.8   < > 44.0   < > 46.1   < > 47.0 42.3 35.3* 35.7* 37.0*  MCV 103.8*  --  95.9  --  96.8  --   --  96.8  --  95.5  --   PLT 221  --  214  --  176  --   --  149*  --  133*  --    < > = values in this interval not displayed.     Coagulation Studies: No results for input(s): LABPROT, INR in the last 72 hours.  Imaging MRI brain ordered and pending    ASSESSMENT AND PLAN: 54 year old male with multiple comorbidities as noted above presented after cardiac arrest status post TTM, total downtime about 30 minutes. Continuous EEG showed generalized periodic epileptiform discharges, currently on Keppra.  Status epilepticus Cardiac arrest status post TTM Suspected anoxic/hypoxic brain injury -Continuous EEG now showing subclinical status epilepticus.  Recommendations -Ordered IV Ativan 2 mg stat and a loading dose of valproic acid 2000 mg.  Continue Keppra 500 mg twice daily (renally dosed) -We will discontinue LTM EEG to obtain an MRI brain without contrast and look for extent of anoxic brain injury. -I discussed with patient significant other and father my concern that patient has likely sustained severe anoxic brain injury.  Patient's significant other and her father stated that they will discuss with patient's rest of the family regarding further goals of care -Recommend palliative care consult to facilitate goals of care discussion. -As needed IV Ativan for clinical seizure-like activity. -Of note, patient's brother called me this morning requesting to come see the patient.  I explained to him that due to Covid, there are visitor  restrictions and to contact the critical care team for visitor guidelines. However, patient's brother replied that I became a doctor for money and did not care about my patient.  I informed him that unfortunately the restrictions in place are due to Covid and I will inform his primary team that he would like to come see the patient but I do not have the authority to allow him to visit right away.  I informed patient's nurse Archie Patten who then explained the visit restrictions to patient's family in room.  -Management of rest of comorbidities per primary team  CRITICAL CARE Performed by: Charlsie Quest   Total critical care time: 50 minutes  Critical care time was exclusive of separately billable procedures and treating other patients.  Critical care was necessary to treat or prevent imminent or life-threatening deterioration.  Critical care was time spent personally by me on the following activities: development of treatment plan with patient and/or surrogate as well as nursing, discussions with consultants, evaluation of patient's response to treatment, examination of patient, obtaining history from patient or surrogate, ordering and performing treatments and interventions, ordering and review of laboratory studies, ordering and review of radiographic studies, pulse oximetry and re-evaluation of patient's condition.     Lindie Spruce Ep[facility] For questions after 5pm please refer to AMION to reach the Neurologist on call

## 2020-02-21 NOTE — Plan of Care (Signed)
  Problem: Elimination: Goal: Will not experience complications related to urinary retention Outcome: Progressing   Problem: Safety: Goal: Ability to remain free from injury will improve Outcome: Progressing   Problem: Education: Goal: Knowledge of General Education information will improve Description: Including pain rating scale, medication(s)/side effects and non-pharmacologic comfort measures Outcome: Not Progressing   Problem: Health Behavior/Discharge Planning: Goal: Ability to manage health-related needs will improve Outcome: Not Progressing   Problem: Clinical Measurements: Goal: Ability to maintain clinical measurements within normal limits will improve Outcome: Not Progressing Goal: Will remain free from infection Outcome: Not Progressing Goal: Diagnostic test results will improve Outcome: Not Progressing Goal: Respiratory complications will improve Outcome: Not Progressing Goal: Cardiovascular complication will be avoided Outcome: Not Progressing   Problem: Activity: Goal: Risk for activity intolerance will decrease Outcome: Not Progressing   Problem: Nutrition: Goal: Adequate nutrition will be maintained Outcome: Not Progressing   Problem: Coping: Goal: Level of anxiety will decrease Outcome: Not Progressing   Problem: Elimination: Goal: Will not experience complications related to bowel motility Outcome: Not Progressing   Problem: Pain Managment: Goal: General experience of comfort will improve Outcome: Not Progressing   Problem: Skin Integrity: Goal: Risk for impaired skin integrity will decrease Outcome: Not Progressing

## 2020-02-21 NOTE — Procedures (Addendum)
Patient Name:Maurice Nguyen IOX:[id number] Epilepsy Attending:Xylah Early Annabelle Harman Referring Physician/Provider:Dr Peter [place] Duration:02/20/2020 2683 to7/29/[id number]  Patient history:54yo M s/p cardiac arrest on TTM. EEG to evaluate for seizure.  Level of alertness:comatose  AEDs during EEG study:keppra  Technical aspects: This EEG study was done with scalp electrodes positioned according to the 10-20 International system of electrode placement. Electrical activity was acquired at a sampling rate of 500Hz  and reviewed with a high frequency filter of 70Hz  and a low frequency filter of 1Hz . EEG data were recorded continuously and digitally stored.   Description: EEG showed generalized periodic epileptiform discharges at 2 to 2.5 Hz which gradually worsened to 2.5 to 3 Hz after around 0730 on 02/21/2020.  Continuous generalized 3 to 5 Hz theta-delta slowing was also noted.  Event button was pressed on 02/21/1020 at 0758 for unclear reason.  Concomitant EEG showed generalized periodic epileptiform discharges at 3 Hz.  ABNORMALITY -Nonconvulsive status epilepticus, generalized - Continuous slow, generalized - Periodic epileptiform discharges with triphasic morphology, generalized   IMPRESSION: This studyinitially showed evidence of generalized epileptogenicity  with periodic epileptiform discharges at 2-2.5Hz .  After around 0730 on 02/20/2020, EEG showed generalized periodic epileptiform discharges at 2.5 to 3 Hz without any clinical symptoms consistent with nonconvulsive status epilepticus.  Additionally, there is evidence of profound diffuse encephalopathy, nonspecific etiology but likely related to anoxic/hypoxic brain injury.  EEG appears to be worsening compared to previous day.  Rosemary Mossbarger 02/23/2020

## 2020-02-22 ENCOUNTER — Encounter (HOSPITAL_COMMUNITY): Payer: Self-pay | Admitting: *Deleted

## 2020-02-22 DIAGNOSIS — I469 Cardiac arrest, cause unspecified: Secondary | ICD-10-CM | POA: Diagnosis not present

## 2020-02-22 DIAGNOSIS — J9601 Acute respiratory failure with hypoxia: Secondary | ICD-10-CM | POA: Diagnosis not present

## 2020-02-22 DIAGNOSIS — Z711 Person with feared health complaint in whom no diagnosis is made: Secondary | ICD-10-CM

## 2020-02-22 DIAGNOSIS — I5043 Acute on chronic combined systolic (congestive) and diastolic (congestive) heart failure: Secondary | ICD-10-CM | POA: Diagnosis not present

## 2020-02-22 DIAGNOSIS — Z7189 Other specified counseling: Secondary | ICD-10-CM

## 2020-02-22 DIAGNOSIS — Z515 Encounter for palliative care: Secondary | ICD-10-CM

## 2020-02-22 LAB — GLUCOSE, CAPILLARY
Glucose-Capillary: 131 mg/dL — ABNORMAL HIGH (ref 70–99)
Glucose-Capillary: 139 mg/dL — ABNORMAL HIGH (ref 70–99)
Glucose-Capillary: 151 mg/dL — ABNORMAL HIGH (ref 70–99)
Glucose-Capillary: 152 mg/dL — ABNORMAL HIGH (ref 70–99)
Glucose-Capillary: 159 mg/dL — ABNORMAL HIGH (ref 70–99)
Glucose-Capillary: 163 mg/dL — ABNORMAL HIGH (ref 70–99)

## 2020-02-22 LAB — BASIC METABOLIC PANEL
Anion gap: 12 (ref 5–15)
BUN: 52 mg/dL — ABNORMAL HIGH (ref 6–20)
CO2: 26 mmol/L (ref 22–32)
Calcium: 8.5 mg/dL — ABNORMAL LOW (ref 8.9–10.3)
Chloride: 104 mmol/L (ref 98–111)
Creatinine, Ser: 2.7 mg/dL — ABNORMAL HIGH (ref 0.61–1.24)
GFR calc Af Amer: 29 mL/min — ABNORMAL LOW (ref >60)
GFR calc non Af Amer: 25 mL/min — ABNORMAL LOW (ref >60)
Glucose, Bld: 187 mg/dL — ABNORMAL HIGH (ref 70–99)
Potassium: 3.9 mmol/L (ref 3.5–5.1)
Sodium: 142 mmol/L (ref 135–145)

## 2020-02-22 LAB — CBC
HCT: 34.2 % — ABNORMAL LOW (ref 39.0–52.0)
Hemoglobin: 11.2 g/dL — ABNORMAL LOW (ref 13.0–17.0)
MCH: 31.6 pg (ref 26.0–34.0)
MCHC: 32.7 g/dL (ref 30.0–36.0)
MCV: 96.6 fL (ref 80.0–100.0)
Platelets: 131 10*3/uL — ABNORMAL LOW (ref 150–400)
RBC: 3.54 MIL/uL — ABNORMAL LOW (ref 4.22–5.81)
RDW: 14.1 % (ref 11.5–15.5)
WBC: 10.4 10*3/uL (ref 4.0–10.5)
nRBC: 0.4 % — ABNORMAL HIGH (ref 0.0–0.2)

## 2020-02-22 LAB — HEPARIN LEVEL (UNFRACTIONATED): Heparin Unfractionated: 0.24 IU/mL — ABNORMAL LOW (ref 0.30–0.70)

## 2020-02-22 LAB — PHOSPHORUS: Phosphorus: 4.6 mg/dL (ref 2.5–4.6)

## 2020-02-22 LAB — COOXEMETRY PANEL
Carboxyhemoglobin: 0.9 % (ref 0.5–1.5)
Methemoglobin: 0.8 % (ref 0.0–1.5)
O2 Saturation: 91.7 %
Total hemoglobin: 11.8 g/dL — ABNORMAL LOW (ref 12.0–16.0)

## 2020-02-22 LAB — MAGNESIUM: Magnesium: 1.8 mg/dL (ref 1.7–2.4)

## 2020-02-22 MED ORDER — MAGNESIUM SULFATE 2 GM/50ML IV SOLN
2.0000 g | Freq: Once | INTRAVENOUS | Status: AC
  Administered 2020-02-22: 2 g via INTRAVENOUS
  Filled 2020-02-22: qty 50

## 2020-02-22 MED ORDER — ALTEPLASE 2 MG IJ SOLR
2.0000 mg | Freq: Once | INTRAMUSCULAR | Status: AC
  Administered 2020-02-22: 2 mg
  Filled 2020-02-22: qty 2

## 2020-02-22 NOTE — Progress Notes (Signed)
Subjective: No clinical seizures overnight. Had family meeting today with brother, sisters, father and significant other [name]  ROS: Unable to obtain due to poor mental status  Examination  Vital signs in last 24 hours: Temp:  [98.2 F (36.8 C)-98.8 F (37.1 C)] 98.6 F (37 C) (07/30 1555) Pulse Rate:  [81-130] 101 (07/30 1610) Resp:  [3-26] 21 (07/30 1610) BP: (105-191)/(68-123) 163/85 (07/30 1610) SpO2:  [98 %-100 %] 98 % (07/30 1610) Arterial Line BP: (88-166)/(69-145) 127/82 (07/30 1500) FiO2 (%):  [40 %] 40 % (07/30 1610) Weight:  [168.8 kg] 168.8 kg (07/30 0500)  General: lying in bed, not in apparent distress CVS: pulse-normal rate and rhythm RS: intubated, initiating spontaneous breaths Extremities: normal, warm Neuro: Comatose, does not open eyes to noxious stimuli, pupils small but equally round and reactive, corneal reflex intact, gag reflex intact, withdraws to noxious stimuli in bilateral upper extremities, no movement in bilateral lower extremities.  Basic Metabolic Panel: Recent Labs  Lab 02/19/2020 2341 02/16/2020 2341 02/19/20 0412 02/19/20 0412 02/19/20 0414 02/20/20 0405 02/20/20 0905 02/21/20 0221 02/21/20 0518 02/21/20 0525 02/22/20 0531  NA 137   < > 138   < > 140 138  --  140  --  140 142  K 4.2   < > 4.5   < > 4.4 4.7  --  3.8 3.8 3.7 3.9  CL 103  --  103  --   --  100  --  101  --   --  104  CO2 19*  --  19*  --   --  22  --  24  --   --  26  GLUCOSE 229*  --  211*  --   --  243*  --  182*  --   --  187*  BUN 34*  --  38*  --   --  49*  --  56*  --   --  52*  CREATININE 2.48*  --  2.74*  --   --  3.75*  --  3.69*  --   --  2.70*  CALCIUM 8.0*   < > 8.3*   < >  --  8.3*  --  8.3*  --   --  8.5*  MG  --   --  1.7  --   --   --  1.8  --  1.7  --  1.8  PHOS  --   --  4.1  --   --   --   --   --  5.9*  --  4.6   < > = values in this interval not displayed.    CBC: Recent Labs  Lab 02/01/2020 1014 02/09/2020 1022 01/25/2020 1741 02/13/2020 1741  02/19/20 0412 02/19/20 0414 02/20/20 0405 02/20/20 2335 02/21/20 0221 02/21/20 0525 02/22/20 0531  WBC 26.4*   < > 18.0*  --  17.8*  --  20.3*  --  13.1*  --  10.4  NEUTROABS 15.7*  --   --   --   --   --   --   --   --   --   --   HGB 14.5   < > 14.4   < > 14.9   < > 13.9 11.7* 11.8* 12.6* 11.2*  HCT 46.8   < > 44.0   < > 46.1   < > 42.3 35.3* 35.7* 37.0* 34.2*  MCV 103.8*   < > 95.9  --  96.8  --  96.8  --  95.5  --  96.6  PLT 221   < > 214  --  176  --  149*  --  133*  --  131*   < > = values in this interval not displayed.     Coagulation Studies: No results for input(s): LABPROT, INR in the last 72 hours.  Imaging CT head wo contrast: No CT evidence of acute intracranial abnormality. Please note brain MRI would have greater sensitivity for acute hypoxic/ischemic injury. 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 Stable mild chronic small vessel ischemic disease.  ASSESSMENT AND PLAN: 54 year old male with multiple comorbidities as noted above presented after cardiac arrest status post TTM,total downtime about 30 minutes. Continuous EEG showed generalized periodic epileptiform discharges, currently on Keppra.  Status epilepticus Cardiac arrest status post TTM Suspected anoxic/hypoxic brain injury - Given patient;s prolonged downtime, neuro exam, eeg findings, I am concerned that patient has suffered irreversible devastating anoxic/hypoxic neurologic injury with slim to no chances of meaningful neurologic recovery.  Recommendations - Family meeting today to discuss GOC with family, ICU team, Palliative care team and RN Explained neurologic prognosis to family. Patient's family wants to wait 48 hours and if no improvement, will proceed with terminal extubation.  -Continue keppra and VPA - IV Ativan 2mg  for any clinical seizure like activity -Management of rest of comorbidities per primary team  CRITICAL CARE Performed by:    Total critical care time:58minutes  Critical care time was exclusive of separately billable procedures and treating other patients.  Critical care was necessary to treat or prevent imminent or life-threatening deterioration.  Critical care was time spent personally by me on the following activities: development of treatment plan with patient and/or surrogate as well as nursing, discussions with consultants, evaluation of patient's response to treatment, examination of patient, obtaining history from patient or surrogate, ordering and performing treatments and interventions, ordering and review of laboratory studies, ordering and review of radiographic studies, pulse oximetry and re-evaluation of patient's condition.  45m Epilepsy [facility] For questions after 5pm please refer to AMION to reach the Neurologist on call

## 2020-02-22 NOTE — Progress Notes (Signed)
Family conference again today with all siblings and father, and significant other.  After discussion of patient's potential for meaningful recovery, family have agreed to 48 hours to ensure he has no clinical improvement during that time. After 48 hours ( 02/24/2020) if no clinical improvement, they will meet again with palliation and critical care  to discuss comfort measures. The family were in agreement with making the patient a limited Code ( Pt is already intubated) and letting him pass away should he have a cardiac arrest or catastrophic event prior to the 48 hour window discussed.  The family are being supported by chaplain and nursing as they are all being given the chance to spent 10 minutes with the patient.   I appreciate the collaboration of the palliative care team, nursing  and Dr. Melynda Ripple in working with Maurice Nguyen family.   Maurice Ngo, MSN, AGACNP-BC [place] [facility] See Amion for personal pager PCCM on call pager [phone] 02/22/2020 2:54 PM

## 2020-02-22 NOTE — Progress Notes (Signed)
Family conference for goals of care with Patient significant other, and father. Sisters were present via telephone. Discussed patient 's potential for meaningful life / recovery after cardiac arrest with hypoxic brain injury. We reviewed CT Head results as well as EEG and other neuro prognostication. It has been 72 hours with minimal sedation and patient does not follow commands, withdraws to pain, does breath over vent.  Palliation participated in the conference as well. We discussed that the path beyond the hospital in patient's with anoxic  brain injury can include tracheostomy,  feeding tube and long tern care facility. We discussed whether any of the family had discussed end of life issues with the patient. It appears nobody had this conversation. We asked them to think about  what he would consider quality of life in making decisions for him as he cannot make these decision on his own.  Sisters who were meeting via phone asked for some time to process this information.  Plan is to meet with Palliative care and family members in person at 2 pm. Today.  This meeting was productive , and everyone was calm . It is important that this family receives the same messaging at all times. Meeting in conjunction/ coordination  with Neuro and Palliation was helpful.  We will meet again at 2 pm  Bevelyn Ngo, MSN, AGACNP-BC [facility] Pulmonary/Critical Care Medicine See Amion for personal pager PCCM on call pager [phone] 02/22/2020 11:11 AM

## 2020-02-22 NOTE — Progress Notes (Addendum)
NAME:  BIANCA VESTER, MRN:  [id number], DOB:  Mar 28, 1966, LOS: 4 ADMISSION DATE:  Mar 04, 2020, CONSULTATION DATE:  Mar 04, 2020 REFERRING MD:  Renaye Rakers, CHIEF COMPLAINT:  Cardiac arrest  Brief History   54yom s/p asystolic cardiac arrest. downtime.   History of present illness   54yom with NICM, HFrEF, afib, HTN, T2DM who presented via EMS s/p cardiac arrest. Wife notes that she woke up around 0700 at which time her husband was ok. They got up and ate some breakfast and when back to sleep. When she woke back up around 0730, she noted that he had agonal breathing and was non-responsive. EMS was called and pt was noted to have a pulse at that time. Shortly thereafter, he developed asystole, required 8 rounds of cpr. He transitioned into vfib arrest which responded appropriately to one shock. On arrival to the ED, he was noted to be hypothermic with temp of 83.41F. Labs showing trop of 167, K 5.4. Head CT negative for acute process.   Past Medical History  NICM, HFrEF (30-35%), afib, HTN, PAD, T2DM  Significant Hospital Events   7/26 admission 7/27 rewarmed 7/28 worsening epileptiform discharges -- concerning for anoxic brain injury  Consults:  Cardiology Neurology   Procedures:  7/26 ETT >> 7/26 [facility] >>  Significant Diagnostic Tests:  7/26 CXR>>pulm vascular congestion 7/26 head CT>>no acute findings 7/26 Echo>> EF 20-25%, LV with severely decreased function/ global hypokinesis LV hypertrophy, RV function is moderately reduced, RV is moderately enlarged Aortic Dilation 71mm  LTM EEG 7/26 EEG suppression, generalized  , Polyspikes, generalized                  7/27 >> EEG suppression, generalized ,continuous slow, generalized, Periodic epileptiform discharges with triphasic morphology, generalized       7/28 >> worsening epileptiform discharges   7/26 UDS ++ cocaine 7/29 CT Head without contrast>> No CT evidence of acute intracranial abnormality chronic cortically based infarct  within the lateral left occipital lobe. Stable mild chronic small vessel ischemic disease Micro Data:  7/26 blood cultures>>ng 7/26 urine culture>> 7/26 resp culture>> abundant WBC 7/26 MRSA > neg   Antimicrobials:  7/26 vancomycin - 7/29 7/26 cefepime >  7/26 flagyl  Interim history/subjective:   02/21/2020 worsening epileptiform discharges captured on EEG   Run of VT 7/30 am..Remains on amio gtt. Mag 1.8, K 3.7  Remains on NE, milrinone, on minimal precedex and intubated  Does initiate spontaneous breaths , but decordicate to noxious stimuli MAP > 65 on Levo at 27 mcg Unable to do MRI as patient Girth too large for scanner. CT head without done instead. Family discussion set for this afternoon with Neuro Unable to pull back on Central line and A line, but good waveform and infusions running without problem  + 3 L WBC is 10.4 T Max 98.8 Objective   Blood pressure 105/74, pulse 98, temperature 98.8 F (37.1 C), temperature source Bladder, resp. rate 16, height 6\' 2"  (1.88 m), weight (!) 168.8 kg, SpO2 99 %. CVP:  [0 mmHg-10 mmHg] 10 mmHg  Vent Mode: PRVC FiO2 (%):  [40 %] 40 % Set Rate:  [16 bmp] 16 bmp Vt Set:  [650 mL] 650 mL PEEP:  [5 cmH20] 5 cmH20 Plateau Pressure:  [14 cmH20-17 cmH20] 14 cmH20   Intake/Output Summary (Last 24 hours) at 02/22/2020 0811 Last data filed at 02/22/2020 0700 Gross per 24 hour  Intake 2420.19 ml  Output 3185 ml  Net -764.81 ml   02/24/2020  Weights   02/20/20 0347 02/21/20 0400 02/22/20 0500  Weight: (!) 171.4 kg (!) 168 kg (!) 168.8 kg    Examination: General: Morbidly obese middle aged M, critically ill appearing on pressors, mechanical ventilation, LTM  HENT: NCAT. EEG on place. ETT secure. OGT with brown colored output.  Lungs: Bilateral chest excursion, coarse rhonchi noted throughout, diminished per bases Mechanically ventilated. No adventitious sounds  Cardiovascular: irir, s1s2. Cap refill < 3 seconds, Ectopy per EKG.  Abdomen:  Obese, round, soft  Extremities: No obvious joint deformity. No cyanosis or clubbing  Neuro: No eye twitching this am. Does not follow commands.Decordicate to noxious stimuli PERRL 71mm. Does not track. Overbreathing vent +gag  + cough  Skin: Cutaneous changes from venous insufficiency. Skin warm dry. Arctic sun pads in place   Resolved Hospital Problem list   n/a  Assessment & Plan:   Asystolic cardiac arrest/Vfib cardiac arrest. ROSC achieved around . Unclear cause at this time. Possibly precipitated by respiratory arrest as his wife heard agonal breathing prior to losing pulse. Primary concerns would be respiratory failure vs arrhythmia vs PE vs MI vs electrolyte derangement.  NICM with HFrEF. Last echo in March 2021 showing EF of 30-35%. Heart cath 09/2019 showed mild non-obstructive disease except for severe disease in a small PL branch. Shock--cardiogenic vs septic--favor cardiogenic.  EF 20-25% UDS + for Cocaine 7/28 Coox improvement from 65% to 85% on 0.125 milrinone  Neo weaned to 27 mcg on 7/30 Plan Continue NE for MAP > 65 On Milrinone 0.125.    Paroxysmal atrial fibrillation. CHADSVASc=5. Anticoagulated on xarelto Intermittent VTach  Plan Continue heparin Continue amio Mag goal > 2, K goal >4 Trend BMP, mag, ionized calcium   Acute encephalopathy.  Primary concern right now is hypoxic injury.  Head CT negative Epileptiform discharges   On EEG. Neurology has discussed c/f anoxic injury with family Unable to do MRI brain due to patient girth.  Plan:  Appreciate neurology assistance Continue Keppra  On minimal precedex   Monitor for seizures  Acute respiratory failure requiring intubation Unclear if this may have precipitated the cardiac arrest. Currently on .40, 16, 5, 650 Plan MV support VAP, PAD  Does initiate spontaneous breaths, have not pursued SBTs yet- Awaiting assessment of anoxic injury CXR prn  Sepsis -?  Aspiration Pt with history of chronic  LLE wound, but doubt this is source Plan:  Continue cefepime empirically for suspected aspiration PNA. Dc vanc.  Flagyl previously discontinued  WOC  Transaminitis due to shock liver In setting of cardiac arrest  Down-trending LFTs Plan Trend LFTs, coags PRN  Acute kidney injury likely ATN related to hypoperfusion. Cr with slight down trend  ,  continues to make urine, no indication for RRT at present Plan  Continue foley catheter with strict I/O  Trend renal indices   Hypocalcemia Borderline hypokalemia Plan S/p replacement 7/29 Corrects to 9.8 once corrected for albumin Continue to trend and repelete as needed  T2DM.  Plan CBG's SSI + levemir  For palliation and goals of care discussion this afternoon with Neuro.  Palliation was consulted 7/29, most likely will see today.  Best practice:  Diet: NPO>> TF Pain/Anxiety/Delirium protocol (if indicated): Precedex PRN ativan PRN fent  VAP protocol (if indicated): yes DVT prophylaxis: heparin gtt GI prophylaxis: pepcid Glucose control: SSI + basal  Mobility: BR Code Status: Full Family Communication: pending 7/28 Disposition: ICU   CC APP time 30 minutes  Bevelyn Ngo, MSN, AGACNP-BC [place] [facility]/Critical Care  Medicine See Loretha Stapler for personal pager PCCM on call pager [phone] 02/22/2020, 8:11 AM

## 2020-02-22 NOTE — Consult Note (Signed)
Consultation Note Date: 02/22/2020   Patient Name: Maurice Nguyen  DOB: 1965/11/06  MRN: [id number]  Age / Sex: 54 y.o., male  PCP: Bernerd Limbo, MD Referring Physician: Rigoberto Noel, MD  Reason for Consultation: Establishing goals of care  HPI/Patient Profile: 54 y.o. male  with past medical history of DM, hypertension, CKD, CHF, atrial fibrillation, and morbid obesity admitted on 02/11/2020 with cardiogenic shock from cardiac arrest in the setting of cocaine use, acute respiratory failure with hypoxia in setting of cardiac arrest, bradycardia.   Family face treatment option decisions, advanced directive decisions, and anticipatory care needs.   Clinical Assessment and Goals of Care: I have reviewed medical records including EPIC notes, labs, and imaging. Received report from primary RN, Dr.Groce/CCM, and Dr. Kem Boroughs.   Dr. Hortense Ramal, Dr. Elie Confer, primary RN, and myself met with patient's significant other/Cathy and father/Ark at bedside as well as other family members via speakerphone. CCM and neuro provided updates around patient's current medical situation. Conversation was had around what the future of his recovery looks like given current information. Family requested time to process the information given to them this morning. A family meeting was scheduled for 2:00pm this afternoon.  At 2:00pm: Dr. Hortense Ramal, Dr. Elie Confer, primary RN, and myself held a family meeting with Deborah/sister, Aaron/brother, Angela/sister, Dorothy/sister, Cathy/SO, and Jaleen/father - Chaplain waited outside the conference room to provide compassionate emotional support to family when meeting was completed. We met to discuss diagnosis, prognosis, GOC, EOL wishes, disposition, and options.  I introduced Palliative Medicine as specialized medical care for people living with serious illness. It focuses on providing relief from  the symptoms and stress of a serious illness. The goal is to improve quality of life for both the patient and the family.   We discussed patient's current illness and what it means in the larger context of the patient's on-going co-morbidities. Dr. Hortense Ramal and Dr. Elie Confer reviewed and provided updates regarding the patient's current medical situation. Education was provided on current medical interventions and patient's current clinical signs and symptoms - all questions and concerns were addressed. The family expressed understanding that the patient is not showing signs of having a meaningful recovery. The patient's seizure activity as well as brain stem reflex activity, but not purposeful movements, were discussed and explained in detail. Natural disease trajectory and expectations at EOL were discussed. I attempted to elicit values and goals of care important to the patient and family. The difference between aggressive medical intervention and comfort care was considered in light of the patient's goals of care.   The family all expressed that they did not want the patient to suffer. They did not want to prolong his life if he is not going to have a meaningful recovery. They do not want to try and send him to a long term care facility if he shows no meaningful improvements. The family did express their deep desire to have more time for watchful waiting to assess for improvement. Expressed understanding and encouraged a time limited approach  if that was their desire. 48 hours were recommended by CCM and family was agreeable to this. They understand that within this 48 hours, we are assessing for meaningful improvement vs continual decline. Family understands that as of today, the patient has had a steady and gradual decline. If they see no meaningful improvements by Sunday, they will look at transitioning to comfort care at that time. The family present at this meeting expressed concern that other family members  want to see the patient before life support is weaned. EOL visitation policy was reviewed and they expressed understanding.  Concepts specific to code status and artificial feeding were considered and discussed. The family was not interested in pursuing a PEG tube to prolong the patient's life if he shows no signs of a meaningful recovery. Medical recommendation was given for DNR in light of the patient's current medical condition with education provided - family was agreeable to DNR. Code status was updated by CCM to partial code since patient is already intubated.  We discussed a brief life review of the patient. Mr. 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 Kyren/father explained that the patient loved sports, especially football, and they both spoke to each other over the phone daily to discuss different sporting events.   The family states that they are all very close to each other. They expressed how hard this situation is/has been for them and were tearful throughout the meeting.   RN leadership agreed to allow family members present at the meeting to visit the patient, two at a time, with my presence for today only. The chaplain and myself were present during their visits with the patient. Family again were tearful and emotional. Emotional support was provided. Family expressed gratitude to be able to visit with the patient today.  Discussed with family the importance of continued conversation with each other and the medical providers regarding overall plan of care and treatment options, ensuring decisions are within the context of the patient's values and GOCs.    Questions and concerns were addressed. The family was encouraged to call with questions or concerns. PMT card was provided.   OTHER Delphia Grates - significant other of 23 years. She is in close contact with the patient's father and siblings regarding GOC decisions  SUMMARY OF  RECOMMENDATIONS   -Continue current medical treatment -Partial code status initiated: patient is already intubated - no other resuscitation efforts -Family is agreeable to 48 hours of watchful waiting -After 48 hours, if the patient continues to show no signs of a meaningful recovery, they will look at moving forward with transition to comfort measures -Family does not want to pursue trach, PEG, or LTACH -PMT will continue to follow holistically  Code Status/Advance Care Planning:  Limited code - patient is already intubated - no other resuscitation efforts   Palliative Prophylaxis:   Aspiration, Bowel Regimen, Delirium Protocol, Frequent Pain Assessment, Oral Care and Turn Reposition  Additional Recommendations (Limitations, Scope, Preferences):  Full Scope Treatment, No Artificial Feeding and No Tracheostomy  Psycho-social/Spiritual:   Desire for further Chaplaincy support:yes  Additional Recommendations: Grief/Bereavement Support   Created space and opportunity for family to express thoughts and feelings regarding patient's current medical situation.   Emotional support provided  Prognosis:   < 2 weeks  Discharge Planning: Anticipated Hospital Death      Primary Diagnoses: Present on Admission: . Cardiac arrest ([place])   I have reviewed the medical record, interviewed the patient  and family, and examined the patient. The following aspects are pertinent.  Past Medical History:  Diagnosis Date  . A-fib ([place])   . Anticoagulated, on Xarelto, Chads2Vas score 2 10/02/2013  . Back pain, chronic 07/13/2012  . CHF (congestive heart failure) ([place])   . 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 CKD (chronic kidney disease), stage II   . Complication of anesthesia    " THEY LOST ME AND BROUGHT ME BACK "  . Hypertension 07/13/2012  . Irregular heart beat   . Lumbar herniated disc   . Morbid obesity ([place])   . Obesity, morbid, BMI 50 or higher ([place])  07/13/2012  . 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 Renal insufficiency 10/08/2019  . Shortness of breath   . Type II diabetes mellitus ([place]) 07/13/2012   Social History   Socioeconomic History  . Marital status: Single    Spouse name: Not on file  . Number of children: Not on file  . Years of education: Not on file  . Highest education level: Not on file  Occupational History  . Occupation: N/A  Tobacco Use  . Smoking status: Never Smoker  . Smokeless tobacco: Never Used  Vaping Use  . Vaping Use: Never used  Substance and Sexual Activity  . Alcohol use: No    Comment: rarely  . Drug use: No  . Sexual activity: Yes  Other Topics Concern  . Not on file  Social History Narrative   Denies caffeine use    Social Determinants of Health   Financial Resource Strain:   . Difficulty of Paying Living Expenses:   Food Insecurity:   . Worried About Charity fundraiser in the Last Year:   . Arboriculturist in the Last Year:   Transportation Needs:   . Film/video editor (Medical):   Marland Kitchen Lack of Transportation (Non-Medical):   Physical Activity:   . Days of Exercise per Week:   . Minutes of Exercise per Session:   Stress:   . Feeling of Stress :   Social Connections:   . Frequency of Communication with Friends and Family:   . Frequency of Social Gatherings with Friends and Family:   . Attends Religious Services:   . Active Member of Clubs or Organizations:   . Attends Archivist Meetings:   Marland Kitchen Marital Status:    Family History  Problem Relation Age of Onset  . Heart disease Neg Hx    Scheduled Meds: . alteplase  2 mg Intracatheter Once  . chlorhexidine gluconate (MEDLINE KIT)  15 mL Mouth Rinse BID  . Chlorhexidine Gluconate Cloth  6 each Topical Daily  . docusate  100 mg Per Tube BID  . feeding supplement (PROSource TF)  90 mL Per Tube TID  . insulin aspart  0-15 Units Subcutaneous Q4H  . insulin detemir  10 Units Subcutaneous Daily  . mouth rinse  15 mL  Mouth Rinse 10 times per day  . pantoprazole (PROTONIX) IV  40 mg Intravenous Q12H  . polyethylene glycol  17 g Per Tube Daily  . sodium chloride flush  10-40 mL Intracatheter Q12H  . sodium chloride flush  3 mL Intravenous Q12H  . valproic acid  500 mg Oral BID   Continuous Infusions: . sodium chloride    . sodium chloride Stopped (02/20/20 0021)  . sodium chloride    . amiodarone 30 mg/hr (02/22/20 0700)  . ceFEPime (MAXIPIME) IV Stopped (02/21/20  2313)  . dexmedetomidine (PRECEDEX) IV infusion 0.2 mcg/kg/hr (02/22/20 0700)  . feeding supplement (VITAL AF 1.2 CAL) Stopped (02/20/20 1029)  . fentaNYL infusion INTRAVENOUS Stopped (02/21/20 0559)  . heparin 1,650 Units/hr (02/22/20 0903)  . levETIRAcetam Stopped (02/21/20 2212)  . magnesium sulfate bolus IVPB    . milrinone 0.125 mcg/kg/min (02/22/20 0700)  . norepinephrine (LEVOPHED) Adult infusion 27 mcg/min (02/22/20 0700)   PRN Meds:.sodium chloride, acetaminophen (TYLENOL) oral liquid 160 mg/5 mL, docusate, fentaNYL (SUBLIMAZE) injection, LORazepam, polyethylene glycol, sodium chloride flush, sodium chloride flush Medications Prior to Admission:  Prior to Admission medications   Medication Sig Start Date End Date Taking? Authorizing Provider  atorvastatin (LIPITOR) 80 MG tablet Take 1 tablet (80 mg total) by mouth daily at 6 PM. 10/18/19  Yes Gonfa, Charlesetta Ivory, MD  carvedilol (COREG) 12.5 MG tablet Take 1 tablet (12.5 mg total) by mouth 2 (two) times daily with a meal. 10/18/19  Yes Gonfa, Taye T, MD  furosemide (LASIX) 80 MG tablet Take 1 tablet (80 mg total) by mouth 2 (two) times daily. 10/18/19  Yes Mercy Riding, MD  gabapentin (NEURONTIN) 300 MG capsule Take 1 capsule (300 mg total) by mouth 2 (two) times daily. 10/18/19 02/17/21 Yes Mercy Riding, MD  insulin aspart protamine - aspart (NOVOLOG MIX 70/30 FLEXPEN) (70-30) 100 UNIT/ML FlexPen Inject 60 Units into the skin 2 (two) times daily. 11/14/19  Yes [provider]    sacubitril-valsartan (ENTRESTO) 24-26 MG Take 1 tablet by mouth 2 (two) times daily. 10/18/19  Yes Mercy Riding, MD  SYNJARDY XR 11-998 MG TB24 Take 1 tablet by mouth 2 (two) times daily with a meal.  11/17/19  Yes [provider]  Vitamin D, Ergocalciferol, (DRISDOL) 1.25 MG (50000 UNIT) CAPS capsule Take 1 capsule (50,000 Units total) by mouth every 7 (seven) days. 10/23/19  Yes Mercy Riding, MD  Accu-Chek FastClix Lancets MISC 1 each by Other route 3 (three) times daily. 11/14/19   [provider]  glucose blood (ACCU-CHEK GUIDE) test strip 1 each by Other route 3 (three) times daily before meals. 11/14/19 11/13/20  [provider]  HYDROcodone-acetaminophen (NORCO/VICODIN) 5-325 MG tablet Take 1 tablet by mouth every 6 (six) hours as needed. 12/05/19   Trula Slade, DPM  insulin aspart (NOVOLOG) 100 UNIT/ML injection Inject 0-20 Units into the skin 3 (three) times daily with meals. CBG 70 - 120: 0 units CBG 121 - 150: 3 units CBG 151 - 200: 4 units CBG 201 - 250: 7 units CBG 251 - 300: 11 units CBG 301 - 350: 15 units CBG 351 - 400: 20 units CBG > 400: call MD and obtain STAT lab verification 10/18/19   Mercy Riding, MD  insulin NPH Human (NOVOLIN N) 100 UNIT/ML injection Inject 0.35 mLs (35 Units total) into the skin 2 (two) times daily at 8 am and 10 pm. 10/18/19   Mercy Riding, MD  Multiple Vitamin (MULTIVITAMIN WITH MINERALS) TABS tablet Take 1 tablet by mouth daily. 10/18/19   Mercy Riding, MD  nitroGLYCERIN (NITROSTAT) 0.4 MG SL tablet Place 1 tablet (0.4 mg total) under the tongue every 5 (five) minutes x 3 doses as needed for chest pain. 02/03/15   Kelvin Cellar, MD  oxyCODONE-acetaminophen (PERCOCET/ROXICET) 5-325 MG tablet Take 1 tablet by mouth every 8 (eight) hours as needed for moderate pain.  12/03/19   [provider]  polyethylene glycol (MIRALAX / GLYCOLAX) 17 g packet Take 17 g by  mouth 2 (two) times daily as needed for mild  constipation. 10/18/19   Mercy Riding, MD  rivaroxaban (XARELTO) 20 MG TABS tablet Take 1 tablet (20 mg total) by mouth daily with supper. 10/18/19   Mercy Riding, MD  saccharomyces boulardii (FLORASTOR) 250 MG capsule Take 1 capsule (250 mg total) by mouth 2 (two) times daily. 10/18/19   Mercy Riding, MD  sildenafil (VIAGRA) 25 MG tablet Take 50 mg by mouth daily as needed for erectile dysfunction.  01/03/20   [provider]   No Known Allergies Review of Systems  Unable to perform ROS: Intubated    Physical Exam Vitals and nursing note reviewed.  Constitutional:      General: He is not in acute distress.    Appearance: He is ill-appearing.     Interventions: He is sedated and intubated.  Pulmonary:     Effort: No respiratory distress. He is intubated.  Skin:    General: Skin is warm and dry.     Vital Signs: BP 105/74   Pulse 98   Temp 98.8 F (37.1 C) (Bladder)   Resp 16   Ht '6\' 2"'  (1.88 m)   Wt (!) 168.8 kg   SpO2 99%   BMI 47.78 kg/m  Pain Scale: CPOT   Pain Score: 0-No pain   SpO2: SpO2: 99 % O2 Device:SpO2: 99 % O2 Flow Rate: .   IO: Intake/output summary:   Intake/Output Summary (Last 24 hours) at 02/22/2020 0931 Last data filed at 02/22/2020 0700 Gross per 24 hour  Intake 2190.94 ml  Output 3185 ml  Net -994.06 ml    LBM: Last BM Date: 02/14/2020 Baseline Weight: Weight: (!) 185.1 kg Most recent weight: Weight: (!) 168.8 kg     Palliative Assessment/Data: PPS 10%   Discussed case with primary RN, Agricultural consultant, patient's family, Chaplain, CCM, Neurology  Time In: 1400 Time Out: 1510 Time Total: 70 minutes  Greater than 50%  of this time was spent counseling and coordinating care related to the above assessment and plan.  Signed by: Lin Landsman, NP   Please contact Palliative Medicine Team phone at [phone] for questions and concerns.  For individual provider: See Shea Evans

## 2020-02-22 NOTE — Progress Notes (Signed)
Pharmacy Antibiotic Note  Maurice Nguyen is a 54 y.o. male admitted on 20-Feb-2020 with sepsis.  Pharmacy has been consulted for cefepime dosing (currenly day 5) -WBC= 10.4, afebrile, SCr= 2.7 (trend down, baseline ~ 1.1) -trach cultures- GNR, gram variable rods    Plan: Change cefepime to 2gm IV q12h Monitor clinical progress, c/s, renal function    Height: 6\' 2"  (188 cm) Weight: (!) 168.8 kg (372 lb 2.2 oz) IBW/kg (Calculated) : 82.2  Temp (24hrs), Avg:98.5 F (36.9 C), Min:98.2 F (36.8 C), Max:98.8 F (37.1 C)  Recent Labs  Lab  0000 20-Feb-2020 1119 20-Feb-2020 1544 02/20/20 1627 02/20/20 1741 2020/02/20 2312 02-20-2020 2341 02/19/20 0412 02/20/20 0405 02/20/20 0407 02/21/20 0221 02/22/20 0531  WBC   < >  --   --   --  18.0*  --   --  17.8* 20.3*  --  13.1* 10.4  CREATININE  --   --   --    < >  --   --  2.48* 2.74* 3.75*  --  3.69* 2.70*  LATICACIDVEN  --  7.1* 2.9*  --   --  4.7*  --   --   --  3.1*  --   --    < > = values in this interval not displayed.    Estimated Creatinine Clearance: 50.5 mL/min (A) (by C-G formula based on SCr of 2.7 mg/dL (H)).    No Known Allergies  Antimicrobials this admission: 7/26 vancomycin >>  7/29 7/26 cefepime >>   Microbiology results: 7/27 urine - neg 7/26 bld-ngtd 7/26 TA - GNR, GVR 7/27 MRSA PCR- neg  8/27, PharmD Clinical Pharmacist **Pharmacist phone directory can now be found on [email or web address] ([email or web address]).  Listed under [facility].

## 2020-02-22 NOTE — Progress Notes (Signed)
Progress Note  Patient Name: Maurice Nguyen Date of Encounter: 02/22/2020  [facility] Cardiologist: Mertie Moores, MD    Subjective   54 year old gentleman with a history of morbid obesity, diabetes mellitus was admitted 7/26  with cardiac arrest.  UDS is positive for cocaine.  Is no longer having bradycardia.  Is in atrial flutter with rapid V response BP is still marginal   CVP is  9-10 Urine output is low.   Received IV NS bolus overnight   The patient could not get the MRI because he did not fit in the MRI scanner tube Creatinine is down slighlty  Mag is slighlty low K is 3.9 No co-ox available,  Unable to draw blood  Had a 30 beat run of nonsustained VT   Inpatient Medications    Scheduled Meds:  chlorhexidine gluconate (MEDLINE KIT)  15 mL Mouth Rinse BID   Chlorhexidine Gluconate Cloth  6 each Topical Daily   docusate  100 mg Per Tube BID   feeding supplement (PROSource TF)  90 mL Per Tube TID   insulin aspart  0-15 Units Subcutaneous Q4H   insulin detemir  10 Units Subcutaneous Daily   mouth rinse  15 mL Mouth Rinse 10 times per day   pantoprazole (PROTONIX) IV  40 mg Intravenous Q12H   polyethylene glycol  17 g Per Tube Daily   sodium chloride flush  10-40 mL Intracatheter Q12H   sodium chloride flush  3 mL Intravenous Q12H   valproic acid  500 mg Oral BID   Continuous Infusions:  sodium chloride     sodium chloride Stopped (02/20/20 0021)   sodium chloride     amiodarone 30 mg/hr (02/22/20 0700)   ceFEPime (MAXIPIME) IV Stopped (02/21/20 2313)   dexmedetomidine (PRECEDEX) IV infusion 0.2 mcg/kg/hr (02/22/20 0700)   feeding supplement (VITAL AF 1.2 CAL) Stopped (02/20/20 1029)   fentaNYL infusion INTRAVENOUS Stopped (02/21/20 0559)   heparin 1,650 Units/hr (02/22/20 0700)   levETIRAcetam Stopped (02/21/20 2212)   milrinone 0.125 mcg/kg/min (02/22/20 0700)   norepinephrine (LEVOPHED) Adult infusion 27 mcg/min (02/22/20  0700)   PRN Meds: sodium chloride, acetaminophen (TYLENOL) oral liquid 160 mg/5 mL, docusate, fentaNYL (SUBLIMAZE) injection, LORazepam, polyethylene glycol, sodium chloride flush, sodium chloride flush   Vital Signs    Vitals:   02/22/20 0600 02/22/20 0700 02/22/20 0747 02/22/20 0754  BP: (!) 150/90 (!) 140/78  105/74  Pulse: (!) 112 90  98  Resp: _0 Temp:   98.8 F (37.1 C)   TempSrc:   Bladder   SpO2: 100% 100%  99%  Weight:      Height:        Intake/Output Summary (Last 24 hours) at 02/22/2020 0820 Last data filed at 02/22/2020 0700 Gross per 24 hour  Intake 2420.19 ml  Output 3185 ml  Net -764.81 ml   Last 3 Weights 02/22/2020 02/21/2020 02/20/2020  Weight (lbs) 372 lb 2.2 oz 370 lb 6 oz 377 lb 13.9 oz  Weight (kg) 168.8 kg 168 kg 171.4 kg      Telemetry    Afib.   HR is better controlled.   30 beat run of nonsustained VT - Personally Reviewed  ECG     - Personally Reviewed  Physical Exam   Physical Exam: Blood pressure 105/74, pulse 98, temperature 98.8 F (37.1 C), temperature source Bladder, resp. rate 16, height _1  (1.88 m), weight (!) 168.8 kg, SpO2 99 %.  GEN:  Morbidly obese  male,  On vent, unresponsive HEENT: Normal NECK: No JVD; No carotid bruits LYMPHATICS: No lymphadenopathy CARDIAC:Irreg. Irreg.  RESPIRATORY:   vent ABDOMEN: Soft, non-tender, non-distended MUSCULOSKELETAL:  No edema; No deformity  SKIN: Warm and dry NEUROLOGIC:  Unresponsive     Labs    High Sensitivity Troponin:   Recent Labs  Lab 01/31/2020 1014 01/31/2020 1627  TROPONINIHS 169* 7,310*      Chemistry Recent Labs  Lab 02/22/2020 1014 02/19/2020 1022 02/20/20 0405 02/20/20 0405 02/21/20 0221 02/21/20 0221 02/21/20 0518 02/21/20 0525 02/22/20 0531  NA 136   < > 138   < > 140  --   --  140 142  K 5.5*   < > 4.7   < > 3.8   < > 3.8 3.7 3.9  CL 101   < > 100  --  101  --   --   --  104  CO2 17*   < > 22  --  24  --   --   --  26  GLUCOSE 306*   < > 243*   --  182*  --   --   --  187*  BUN 25*   < > 49*  --  56*  --   --   --  52*  CREATININE 1.61*   < > 3.75*  --  3.69*  --   --   --  2.70*  CALCIUM 8.2*   < > 8.3*  --  8.3*  --   --   --  8.5*  PROT 6.1*  --  6.8  --  6.5  --   --   --   --   ALBUMIN 2.9*  --  2.7*  --  2.4*  --   --   --   --   AST 785*  --  95*  --  39  --   --   --   --   ALT 641*  --  275*  --  176*  --   --   --   --   ALKPHOS 124  --  82  --  69  --   --   --   --   BILITOT 1.7*  --  1.4*  --  1.3*  --   --   --   --   GFRNONAA 47*   < > 17*  --  17*  --   --   --  25*  GFRAA 55*   < > 20*  --  20*  --   --   --  29*  ANIONGAP 18*   < > 16*  --  15  --   --   --  12   < > = values in this interval not displayed.     Hematology Recent Labs  Lab 02/20/20 0405 02/20/20 2335 02/21/20 0221 02/21/20 0525 02/22/20 0531  WBC 20.3*  --  13.1*  --  10.4  RBC 4.37  --  3.74*  --  3.54*  HGB 13.9   < > 11.8* 12.6* 11.2*  HCT 42.3   < > 35.7* 37.0* 34.2*  MCV 96.8  --  95.5  --  96.6  MCH 31.8  --  31.6  --  31.6  MCHC 32.9  --  33.1  --  32.7  RDW 14.1  --  14.1  --  14.1  PLT 149*  --  133*  --  131*   < > =  values in this interval not displayed.    BNP Recent Labs  Lab 01/26/2020 1030  BNP 158.1*     DDimer No results for input(s): DDIMER in the last 168 hours.   Radiology    DG Abd 1 View  Result Date: 02/20/2020 CLINICAL DATA:  Orogastric tube placement EXAM: ABDOMEN - 1 VIEW COMPARISON:  10/17/2019 FINDINGS: Examination is limited secondary to poor penetration from patient body habitus. Enteric tube courses below the diaphragm with distal tip and side port terminating within the level of the gastric body. Included bowel gas pattern appears nonobstructive. IMPRESSION: Enteric tube courses below the diaphragm with distal tip and side port terminating at the level of the gastric body. Electronically Signed   By: Davina Poke D.O.   On: 02/20/2020 15:14   CT HEAD WO CONTRAST  Result Date:  02/21/2020 CLINICAL DATA:  Anoxic brain damage. Additional provided: Cardiac arrest. EXAM: CT HEAD WITHOUT CONTRAST TECHNIQUE: Contiguous axial images were obtained from the base of the skull through the vertex without intravenous contrast. COMPARISON:  Head CT examinations 02/01/2020 and 12/11/2019. 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 No definite loss of gray-white differentiation is identified elsewhere. Stable, mild chronic small vessel ischemic disease within the cerebral white matter. This includes a small chronic lacunar infarct within the right centrum semiovale (series 3, image 26). There is no acute intracranial hemorrhage. No extra-axial fluid collection. No evidence of intracranial mass. No midline shift. Vascular: No hyperdense vessel.  Atherosclerotic calcifications. Skull: Normal. Negative for fracture or focal lesion. Sinuses/Orbits: Visualized orbits show no acute finding. Small right maxillary sinus mucous retention cyst. Trace fluid within left mastoid air cells. IMPRESSION: No CT evidence of acute intracranial abnormality. Please note brain MRI would have greater sensitivity for acute hypoxic/ischemic injury. 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 Stable mild chronic small vessel ischemic disease. Electronically Signed   By: Kellie Simmering DO   On: 02/21/2020 19:37   DG Chest Port 1 View  Result Date: 02/21/2020 CLINICAL DATA:  Intubation.  Respiratory failure. EXAM: PORTABLE CHEST 1 VIEW COMPARISON:  02/20/2020. FINDINGS: Endotracheal tube, left IJ line, NG 2 in stable position. Again noted is cardiomegaly with pulmonary venous congestion and bilateral interstitial prominence consistent with CHF. Small left pleural effusion. Similar findings noted on prior exam no pneumothorax. IMPRESSION: 1.  Lines and tubes stable position. 2. Again noted is cardiomegaly with pulmonary venous congestion,  bilateral interstitial prominence, and small left pleural effusion. Findings consistent with CHF. Similar findings noted on prior exam. Electronically Signed   By: [place]   On: 02/21/2020 07:36   DG CHEST PORT 1 VIEW  Result Date: 02/20/2020 CLINICAL DATA:  Possible aspiration. EXAM: PORTABLE CHEST 1 VIEW COMPARISON:  02/20/2020 FINDINGS: NG tube enters the stomach. Left central line is unchanged. Cardiomegaly with vascular congestion. Bilateral lower lobe airspace opacities are similar to prior study. No acute bony abnormality. IMPRESSION: Stable cardiomegaly with vascular congestion and bilateral lower lobe atelectasis or infiltrates. Electronically Signed   By: Rolm Baptise M.D.   On: 02/20/2020 11:27    Cardiac Studies     Patient Profile     54 y.o. male with acute on chronic combined CHF admitted after cardiac arrest.    Assessment & Plan    1.   Cardiac arrest:   In the setting of cocaine abuse Has been rewarmed. Has significant anoxic brain injury clinically ,  We are unable to get  MRI because he is too large to fit into the MRI scanner.  Neuro to see later today - a family conference has been planned.    2.   acute on chronic combined systolic and diastolic congestive heart failure:  Cont levofed. Unable to get co-ox drawn - unable to draw blood from central line   3.   Respiratory failure:   Remains on the vent   4.  Acute kidney injury:  Creatinine continues to increase Making very little urine  Further management per PCCM   5.  Non sustained VT:   Cont amio. Cont amio for now. Replaced mag   6.  Hypomagnesiumia:   Replaced.   For questions or updates, please contact [place] Please consult [email or web address] for contact info under        Signed, Mertie Moores, MD  02/22/2020, 8:20 AM

## 2020-02-22 NOTE — Progress Notes (Signed)
Chaplain provided support to Maurice Nguyen wife and siblings as they prepared for family meeting and when visiting Maurice Nguyen at bedside. Maurice Nguyen shared how grateful she was that the meeting was able to occur so that Maurice Nguyen's siblings could truly see the condition he is in right now.  Maurice Nguyen expressed the amount of stress she has been enduring and also highlighted not wanting to continue to see [name] in that condition.  Chaplain Airam Runions will continue to follow-up.

## 2020-02-23 ENCOUNTER — Encounter (HOSPITAL_COMMUNITY): Payer: Self-pay

## 2020-02-23 ENCOUNTER — Encounter (HOSPITAL_COMMUNITY): Payer: Self-pay | Admitting: Emergency Medicine

## 2020-02-23 ENCOUNTER — Inpatient Hospital Stay (HOSPITAL_COMMUNITY): Payer: Medicare HMO

## 2020-02-23 DIAGNOSIS — I4901 Ventricular fibrillation: Secondary | ICD-10-CM

## 2020-02-23 LAB — CBC
HCT: 35.6 % — ABNORMAL LOW (ref 39.0–52.0)
Hemoglobin: 11.4 g/dL — ABNORMAL LOW (ref 13.0–17.0)
MCH: 31.2 pg (ref 26.0–34.0)
MCHC: 32 g/dL (ref 30.0–36.0)
MCV: 97.5 fL (ref 80.0–100.0)
Platelets: 128 10*3/uL — ABNORMAL LOW (ref 150–400)
RBC: 3.65 MIL/uL — ABNORMAL LOW (ref 4.22–5.81)
RDW: 14.3 % (ref 11.5–15.5)
WBC: 9.7 10*3/uL (ref 4.0–10.5)
nRBC: 0.5 % — ABNORMAL HIGH (ref 0.0–0.2)

## 2020-02-23 LAB — COOXEMETRY PANEL
Carboxyhemoglobin: 1.4 % (ref 0.5–1.5)
Carboxyhemoglobin: 0.9 % (ref 0.5–1.5)
Methemoglobin: 1.1 % (ref 0.0–1.5)
Methemoglobin: 0.9 % (ref 0.0–1.5)
O2 Saturation: 89.3 %
O2 Saturation: 81.6 %
Total hemoglobin: 12.1 g/dL (ref 12.0–16.0)
Total hemoglobin: 12.6 g/dL (ref 12.0–16.0)

## 2020-02-23 LAB — BASIC METABOLIC PANEL
Anion gap: 12 (ref 5–15)
BUN: 35 mg/dL — ABNORMAL HIGH (ref 6–20)
CO2: 26 mmol/L (ref 22–32)
Calcium: 8.7 mg/dL — ABNORMAL LOW (ref 8.9–10.3)
Chloride: 108 mmol/L (ref 98–111)
Creatinine, Ser: 1.74 mg/dL — ABNORMAL HIGH (ref 0.61–1.24)
GFR calc Af Amer: 50 mL/min — ABNORMAL LOW (ref >60)
GFR calc non Af Amer: 43 mL/min — ABNORMAL LOW (ref >60)
Glucose, Bld: 163 mg/dL — ABNORMAL HIGH (ref 70–99)
Potassium: 3.8 mmol/L (ref 3.5–5.1)
Sodium: 146 mmol/L — ABNORMAL HIGH (ref 135–145)

## 2020-02-23 LAB — COMPREHENSIVE METABOLIC PANEL
ALT: 88 U/L — ABNORMAL HIGH (ref 0–44)
AST: 21 U/L (ref 15–41)
Albumin: 2.4 g/dL — ABNORMAL LOW (ref 3.5–5.0)
Alkaline Phosphatase: 64 U/L (ref 38–126)
Anion gap: 11 (ref 5–15)
BUN: 42 mg/dL — ABNORMAL HIGH (ref 6–20)
CO2: 26 mmol/L (ref 22–32)
Calcium: 8.7 mg/dL — ABNORMAL LOW (ref 8.9–10.3)
Chloride: 107 mmol/L (ref 98–111)
Creatinine, Ser: 2.13 mg/dL — ABNORMAL HIGH (ref 0.61–1.24)
GFR calc Af Amer: 39 mL/min — ABNORMAL LOW (ref >60)
GFR calc non Af Amer: 34 mL/min — ABNORMAL LOW (ref >60)
Glucose, Bld: 144 mg/dL — ABNORMAL HIGH (ref 70–99)
Potassium: 3.6 mmol/L (ref 3.5–5.1)
Sodium: 144 mmol/L (ref 135–145)
Total Bilirubin: 1.2 mg/dL (ref 0.3–1.2)
Total Protein: 6.8 g/dL (ref 6.5–8.1)

## 2020-02-23 LAB — CULTURE, BLOOD (ROUTINE X 2)
Culture: NO GROWTH
Culture: NO GROWTH
Special Requests: ADEQUATE

## 2020-02-23 LAB — GLUCOSE, CAPILLARY
Glucose-Capillary: 114 mg/dL — ABNORMAL HIGH (ref 70–99)
Glucose-Capillary: 130 mg/dL — ABNORMAL HIGH (ref 70–99)
Glucose-Capillary: 129 mg/dL — ABNORMAL HIGH (ref 70–99)
Glucose-Capillary: 146 mg/dL — ABNORMAL HIGH (ref 70–99)
Glucose-Capillary: 118 mg/dL — ABNORMAL HIGH (ref 70–99)
Glucose-Capillary: 138 mg/dL — ABNORMAL HIGH (ref 70–99)

## 2020-02-23 LAB — CULTURE, RESPIRATORY W GRAM STAIN

## 2020-02-23 LAB — HEPARIN LEVEL (UNFRACTIONATED): Heparin Unfractionated: 0.22 IU/mL — ABNORMAL LOW (ref 0.30–0.70)

## 2020-02-23 LAB — MAGNESIUM
Magnesium: 1.8 mg/dL (ref 1.7–2.4)
Magnesium: 1.8 mg/dL (ref 1.7–2.4)

## 2020-02-23 MED ORDER — MAGNESIUM SULFATE 2 GM/50ML IV SOLN
2.0000 g | Freq: Once | INTRAVENOUS | Status: AC
  Administered 2020-02-23: 2 g via INTRAVENOUS
  Filled 2020-02-23: qty 50

## 2020-02-23 MED ORDER — POTASSIUM CHLORIDE 20 MEQ/15ML (10%) PO SOLN
40.0000 meq | Freq: Once | ORAL | Status: AC
  Administered 2020-02-23: 40 meq
  Filled 2020-02-23: qty 30

## 2020-02-23 MED ORDER — SODIUM CHLORIDE 0.9 % IV SOLN
2.0000 g | Freq: Three times a day (TID) | INTRAVENOUS | Status: DC
  Administered 2020-02-23: 2 g via INTRAVENOUS
  Filled 2020-02-23 (×3): qty 2

## 2020-02-23 MED ORDER — POTASSIUM CHLORIDE 20 MEQ PO PACK
40.0000 meq | PACK | Freq: Once | ORAL | Status: AC
  Administered 2020-02-23: 40 meq via ORAL
  Filled 2020-02-23: qty 2

## 2020-02-23 NOTE — Progress Notes (Signed)
Pt having episodes of tachypnea.  Weaning ended and pt placed back in full support mode, PRVC.  Total weaning time in PSV mode was 7.75 hours.

## 2020-02-23 NOTE — Progress Notes (Signed)
NAME:  Maurice Nguyen, MRN:  [id number], DOB:  [birth year], LOS: 5 ADMISSION DATE:  02/13/2020, CONSULTATION DATE:  02/07/2020 REFERRING MD:  Renaye Rakers, CHIEF COMPLAINT:  Cardiac arrest  Brief History   54yom s/p asystolic cardiac arrest. downtime.   Hx of NICM, HFrEF, afib, HTN, T2DM who presented via EMS s/p cardiac arrest. Wife notes that she woke up around 0700 at which time her husband was ok. They got up and ate some breakfast and when back to sleep. When she woke back up around 0730, she noted that he had agonal breathing and was non-responsive. EMS was called and pt was noted to have a pulse at that time. Shortly thereafter, he developed asystole, required 8 rounds of cpr. He transitioned into vfib arrest which responded appropriately to one shock. On arrival to the ED, he was noted to be hypothermic with temp of 83.81F. Labs showing trop of 167, K 5.4. Head CT negative for acute process.   Past Medical History  NICM, HFrEF (30-35%), afib, HTN, PAD, T2DM  Significant Hospital Events   7/26 admission 7/27 rewarmed 7/28 worsening epileptiform discharges -- concerning for anoxic brain injury  7/30 Run of VT, Remains on amio, NE, milrinone. Family meeting > plan to reassess in 24-48 hours 7/31 Does not follow commands, non-purposeful movement  Consults:  Cardiology Neurology   Procedures:  7/26 ETT >> 7/26 [facility] >>  Significant Diagnostic Tests:  7/26 CXR>>pulm vascular congestion 7/26 UDS ++ cocaine 7/26 head CT>>no acute findings 7/26 Echo>> EF 20-25%, LV with severely decreased function/ global hypokinesis. LV hypertrophy, RV function is moderately reduced, RV is moderately enlarged. 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 Stable mild chronic small vessel ischemic disease  Micro Data:  7/26 blood cultures >> negative 7/26 urine culture >> negative 7/26 resp culture>> few staph epidermis >> R-erythromycin, oxacillin 7/26 MRSA > neg   Antimicrobials:  7/26 vancomycin - 7/29 7/26 cefepime >>  7/26 flagyl >>  Interim history/subjective:  Tmax 100.1, tylenol given per RN  PSV weaning  On precedex, amiodarone, milrinone, levo RN reports periods of non-purposeful movement overnight  Objective   Blood pressure (!) 150/84, pulse 96, temperature 100.1 F (37.8 C), temperature source Oral, resp. rate (!) 25, height 6\' 2"  (1.88 m), weight (!) 168.3 kg, SpO2 99 %. CVP:  [7 mmHg-10 mmHg] 7 mmHg  Vent Mode: PSV;CPAP FiO2 (%):  [40 %] 40 % Set Rate:  [16 bmp] 16 bmp Vt Set:  [650 mL] 650 mL PEEP:  [5 cmH20] 5 cmH20 Plateau Pressure:  [15 cmH20] 15 cmH20   Intake/Output Summary (Last 24 hours) at 02/23/2020 1430 Last data filed at 02/23/2020 1311 Gross per 24 hour  Intake 2167.21 ml  Output 4155 ml  Net -1987.79 ml   Filed Weights   02/21/20 0400 02/22/20 0500 02/23/20 0400  Weight: (!) 168 kg (!) 168.8 kg (!) 168.3 kg    Examination: General: critically ill appearing adult male lying in bed in NAD HEENT: MM pink/moist, ETT, upward gaze intermittently, no eye contact, does not blink to threat, pupils 7mm Neuro: eyes open, no follow commands, spontaneous movement noted of RUE  CV: s1s2 RRR, no m/r/g PULM: non-labored on PSV,  tachypnea, lungs clear bilaterally, diminished bases  GI: soft, bsx4 active  Extremities: warm/dry, BLE edema and changes c/w chronic venous stasis. LLE with ace wrap  Skin: no rashes or lesions  CXR 7/31 - images personally reviewed, cardiomegaly, right layering effusion, interstitial edema   Resolved Hospital Problem list      Assessment & Plan:    Asystolic cardiac arrest/Vfib cardiac arrest.  ROSC achieved around . Possibly precipitated by respiratory arrest as his wife heard agonal breathing prior to losing pulse. Note UDS positive for cocaine.  NICM with HFrEF Last echo in March 2021 showing EF of 30-35%, down to 20-25%. Heart cath 09/2019 showed mild non-obstructive disease except for severe disease in a small PL branch. Cardiogenic Shock  -continue milrinone, amiodarone, levophed  -appreciate Cardiology assistance  -tele monitoring   Paroxysmal atrial fibrillation CHADSVASc=5. Anticoagulated on xarelto Intermittent VTach  -continue heparin gtt  -follow electrolytes closely, goal K>4, M>2  -amiodarone as above  Acute Anoxic Encephalopathy.  Primary concern right now is hypoxic injury.  Head CT negative Epileptiform Discharges   On EEG. Neurology has discussed c/f anoxic injury with family. Unable to do MRI brain due to patient girth.  -appreciate Neurology assistance  -continue keppra, precedex -follow for seizure activity  -frequent neuro exams  Acute respiratory failure requiring intubation Right Pleural Effusion  -PRVC 8cc/kg as rest mode -Daily PSV but mental status remains barrier for extubation  -follow CXR   Sepsis -?  Aspiration Pt with history of chronic LLE wound, but doubt this is source -continue abx as above -WOC following   Anemia -follow CBC   Transaminitis due to shock liver In setting of cardiac arrest  -follow LFT's   Acute kidney injury  Likely ATN related to hypoperfusion. -Trend BMP / urinary output -Replace electrolytes as indicated -Avoid nephrotoxic agents, ensure adequate renal perfusion  T2DM.  -SSI + levemir   Best practice:  Diet: TF Pain/Anxiety/Delirium protocol (if indicated): Precedex PRN ativan PRN fent  VAP protocol (if indicated): yes DVT prophylaxis: heparin gtt GI prophylaxis: pepcid Glucose control: SSI + basal  Mobility: BR Code Status: Full Family  Communication: updated at bedside 7/31 (wife and father). Disposition: ICU   CC Time: 32 minutes   Canary Brim, MSN, NP-C  Pulmonary & Critical Care 02/23/2020, 2:45 PM   Please see [email or web address] for pager details.

## 2020-02-23 NOTE — Progress Notes (Signed)
Progress Note  Patient Name: Maurice Nguyen Date of Encounter: 02/23/2020  Primary Cardiologist: Mertie Moores, MD   Subjective   Remains intubated off of sedation  Inpatient Medications    Scheduled Meds: . chlorhexidine gluconate (MEDLINE KIT)  15 mL Mouth Rinse BID  . Chlorhexidine Gluconate Cloth  6 each Topical Daily  . docusate  100 mg Per Tube BID  . feeding supplement (PROSource TF)  90 mL Per Tube TID  . insulin aspart  0-15 Units Subcutaneous Q4H  . insulin detemir  10 Units Subcutaneous Daily  . mouth rinse  15 mL Mouth Rinse 10 times per day  . pantoprazole (PROTONIX) IV  40 mg Intravenous Q12H  . polyethylene glycol  17 g Per Tube Daily  . sodium chloride flush  10-40 mL Intracatheter Q12H  . sodium chloride flush  3 mL Intravenous Q12H  . valproic acid  500 mg Oral BID   Continuous Infusions: . sodium chloride    . sodium chloride Stopped (02/20/20 0021)  . sodium chloride    . amiodarone 30 mg/hr (02/23/20 0700)  . ceFEPime (MAXIPIME) IV    . dexmedetomidine (PRECEDEX) IV infusion 0.3 mcg/kg/hr (02/23/20 0700)  . feeding supplement (VITAL AF 1.2 CAL) Stopped (02/20/20 1029)  . fentaNYL infusion INTRAVENOUS Stopped (02/21/20 0559)  . heparin 1,850 Units/hr (02/23/20 0819)  . levETIRAcetam Stopped (02/22/20 2249)  . milrinone 0.125 mcg/kg/min (02/23/20 0700)  . norepinephrine (LEVOPHED) Adult infusion 25 mcg/min (02/23/20 0700)   PRN Meds: sodium chloride, acetaminophen (TYLENOL) oral liquid 160 mg/5 mL, docusate, fentaNYL (SUBLIMAZE) injection, LORazepam, polyethylene glycol, sodium chloride flush, sodium chloride flush   Vital Signs    Vitals:   02/23/20 0500 02/23/20 0600 02/23/20 0700 02/23/20 0720  BP:  (!) 191/95    Pulse: 103 101 104   Resp: '16 16 17   ' Temp:      TempSrc:      SpO2: 100% 100% 99% 100%  Weight:      Height:        Intake/Output Summary (Last 24 hours) at 02/23/2020 7782 Last data filed at 02/23/2020 0800 Gross per 24  hour  Intake 2556.2 ml  Output 3900 ml  Net -1343.8 ml   Filed Weights   02/21/20 0400 02/22/20 0500 02/23/20 0400  Weight: (!) 168 kg (!) 168.8 kg (!) 168.3 kg    Telemetry    Atrial fib with a CVR/RVR - Personally Reviewed  ECG    none - Personally Reviewed  Physical Exam   GEN: intubated Neck: ujnable to assess JVD Cardiac: IRRR, no murmurs, rubs, or gallops.  Respiratory: scattered rales GI: Soft, nontender, non-distended  MS: No edema; No deformity. Neuro:  Nonfocal  Psych: Normal affect   Labs    Chemistry Recent Labs  Lab 02/20/20 0405 02/20/20 0405 02/21/20 0221 02/21/20 0518 02/21/20 0525 02/22/20 0531 02/23/20 0357  NA 138   < > 140   < > 140 142 144  K 4.7   < > 3.8   < > 3.7 3.9 3.6  CL 100   < > 101  --   --  104 107  CO2 22   < > 24  --   --  26 26  GLUCOSE 243*   < > 182*  --   --  187* 144*  BUN 49*   < > 56*  --   --  52* 42*  CREATININE 3.75*   < > 3.69*  --   --  2.70* 2.13*  CALCIUM 8.3*   < > 8.3*  --   --  8.5* 8.7*  PROT 6.8  --  6.5  --   --   --  6.8  ALBUMIN 2.7*  --  2.4*  --   --   --  2.4*  AST 95*  --  39  --   --   --  21  ALT 275*  --  176*  --   --   --  88*  ALKPHOS 82  --  69  --   --   --  64  BILITOT 1.4*  --  1.3*  --   --   --  1.2  GFRNONAA 17*   < > 17*  --   --  25* 34*  GFRAA 20*   < > 20*  --   --  29* 39*  ANIONGAP 16*   < > 15  --   --  12 11   < > = values in this interval not displayed.     Hematology Recent Labs  Lab 02/21/20 0221 02/21/20 0221 02/21/20 0525 02/22/20 0531 02/23/20 0357  WBC 13.1*  --   --  10.4 9.7  RBC 3.74*  --   --  3.54* 3.65*  HGB 11.8*   < > 12.6* 11.2* 11.4*  HCT 35.7*   < > 37.0* 34.2* 35.6*  MCV 95.5  --   --  96.6 97.5  MCH 31.6  --   --  31.6 31.2  MCHC 33.1  --   --  32.7 32.0  RDW 14.1  --   --  14.1 14.3  PLT 133*  --   --  131* 128*   < > = values in this interval not displayed.    Cardiac EnzymesNo results for input(s): TROPONINI in the last 168 hours. No  results for input(s): TROPIPOC in the last 168 hours.   BNP Recent Labs  Lab 02/23/2020 1030  BNP 158.1*     DDimer No results for input(s): DDIMER in the last 168 hours.   Radiology    CT HEAD WO CONTRAST  Result Date: 02/21/2020 CLINICAL DATA:  Anoxic brain damage. Additional provided: Cardiac arrest. EXAM: CT HEAD WITHOUT CONTRAST TECHNIQUE: Contiguous axial images were obtained from the base of the skull through the vertex without intravenous contrast. COMPARISON:  Head CT examinations 01/28/2020 and 12/11/2019. 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 No definite loss of gray-white differentiation is identified elsewhere. Stable, mild chronic small vessel ischemic disease within the cerebral white matter. This includes a small chronic lacunar infarct within the right centrum semiovale (series 3, image 26). There is no acute intracranial hemorrhage. No extra-axial fluid collection. No evidence of intracranial mass. No midline shift. Vascular: No hyperdense vessel.  Atherosclerotic calcifications. Skull: Normal. Negative for fracture or focal lesion. Sinuses/Orbits: Visualized orbits show no acute finding. Small right maxillary sinus mucous retention cyst. Trace fluid within left mastoid air cells. IMPRESSION: No CT evidence of acute intracranial abnormality. Please note brain MRI would have greater sensitivity for acute hypoxic/ischemic injury. 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 Stable mild chronic small vessel ischemic disease. Electronically Signed   By: Kellie Simmering DO   On: 02/21/2020 19:37    Cardiac Studies   none  Patient Profile     54 y.o. male admitted with a VF arrest, prolonged down time, anoxic encephalopathy, positive UDS for cocaine.  Assessment & Plan    1. VF arrest - no additional ventricular arrhythmias. Continue supportive care. 2. Anoxic encephalopathy - neuro  service noted with plans for full support for 48 hours and if no clinical improvement consider withdrawal of care. 3. VDRF - he will remain intubated. No plans for weaning. 4. Atrial fib - his VR is reasonably well controlled 100-120. Could add some IV beta blocker if HR's go over 120.      For questions or updates, please contact [place] Please consult [email or web address] for contact info under Cardiology/STEMI.      Signed, Cristopher Peru, MD  02/23/2020, 9:03 AM  Patient ID: Maurice Nguyen, male   DOB: 25-Jul-1966, 54 y.o.   MRN: [id number]

## 2020-02-23 NOTE — Progress Notes (Signed)
ANTICOAGULATION CONSULT NOTE  Pharmacy Consult for heparin Indication: atrial fibrillation  Assessment: 54 yom on Xarelto PTA for afib presenting s/p CPR with ROSC, intubated and code cool initiated. Pharmacy consulted to dose heparin for afib.   Heparin level still below goal this morning. Hgb stable in 11s. No bleeding issues have been noted.   Goal of Therapy:  Heparin level 0.3-0.7 units/ml Monitor platelets by anticoagulation protocol: Yes   Plan:  Increase heparin to 1850 units/hr Check heparin level in am  Thanks for allowing pharmacy to be a part of this patient's care.  Sheppard Coil PharmD., BCPS Clinical Pharmacist 02/23/2020 7:35 AM

## 2020-02-24 ENCOUNTER — Inpatient Hospital Stay (HOSPITAL_COMMUNITY): Payer: Medicare HMO

## 2020-02-24 ENCOUNTER — Encounter (HOSPITAL_COMMUNITY): Payer: Self-pay | Admitting: Pulmonary Disease

## 2020-02-24 ENCOUNTER — Other Ambulatory Visit: Payer: Self-pay

## 2020-02-24 LAB — BASIC METABOLIC PANEL
Anion gap: 10 (ref 5–15)
BUN: 29 mg/dL — ABNORMAL HIGH (ref 6–20)
CO2: 27 mmol/L (ref 22–32)
Calcium: 8.7 mg/dL — ABNORMAL LOW (ref 8.9–10.3)
Chloride: 109 mmol/L (ref 98–111)
Creatinine, Ser: 1.64 mg/dL — ABNORMAL HIGH (ref 0.61–1.24)
GFR calc Af Amer: 53 mL/min — ABNORMAL LOW (ref >60)
GFR calc non Af Amer: 46 mL/min — ABNORMAL LOW (ref >60)
Glucose, Bld: 150 mg/dL — ABNORMAL HIGH (ref 70–99)
Potassium: 3.9 mmol/L (ref 3.5–5.1)
Sodium: 146 mmol/L — ABNORMAL HIGH (ref 135–145)

## 2020-02-24 LAB — CBC
HCT: 37.2 % — ABNORMAL LOW (ref 39.0–52.0)
Hemoglobin: 11.7 g/dL — ABNORMAL LOW (ref 13.0–17.0)
MCH: 31.1 pg (ref 26.0–34.0)
MCHC: 31.5 g/dL (ref 30.0–36.0)
MCV: 98.9 fL (ref 80.0–100.0)
Platelets: 124 10*3/uL — ABNORMAL LOW (ref 150–400)
RBC: 3.76 MIL/uL — ABNORMAL LOW (ref 4.22–5.81)
RDW: 14.6 % (ref 11.5–15.5)
WBC: 9.3 10*3/uL (ref 4.0–10.5)
nRBC: 0.3 % — ABNORMAL HIGH (ref 0.0–0.2)

## 2020-02-24 LAB — GLUCOSE, CAPILLARY
Glucose-Capillary: 131 mg/dL — ABNORMAL HIGH (ref 70–99)
Glucose-Capillary: 124 mg/dL — ABNORMAL HIGH (ref 70–99)
Glucose-Capillary: 147 mg/dL — ABNORMAL HIGH (ref 70–99)
Glucose-Capillary: 150 mg/dL — ABNORMAL HIGH (ref 70–99)
Glucose-Capillary: 144 mg/dL — ABNORMAL HIGH (ref 70–99)
Glucose-Capillary: 161 mg/dL — ABNORMAL HIGH (ref 70–99)

## 2020-02-24 LAB — COOXEMETRY PANEL
Carboxyhemoglobin: 1.5 % (ref 0.5–1.5)
Methemoglobin: 0.9 % (ref 0.0–1.5)
O2 Saturation: 78.9 %
Total hemoglobin: 12.3 g/dL (ref 12.0–16.0)

## 2020-02-24 LAB — HEPARIN LEVEL (UNFRACTIONATED): Heparin Unfractionated: 0.26 IU/mL — ABNORMAL LOW (ref 0.30–0.70)

## 2020-02-24 MED ORDER — MIDAZOLAM HCL 2 MG/2ML IJ SOLN: 2.0000 mg | INTRAMUSCULAR | Status: DC | PRN

## 2020-02-24 MED ORDER — MIDAZOLAM HCL 2 MG/2ML IJ SOLN
INTRAMUSCULAR | Status: AC
  Administered 2020-02-24: 2 mg via INTRAVENOUS
  Filled 2020-02-24: qty 2

## 2020-02-24 MED ORDER — MIDAZOLAM HCL 2 MG/2ML IJ SOLN
2.0000 mg | INTRAMUSCULAR | Status: DC | PRN
  Administered 2020-02-24 – 2020-02-27 (×7): 2 mg via INTRAVENOUS
  Filled 2020-02-24 (×7): qty 2

## 2020-02-24 MED ORDER — FENTANYL CITRATE (PF) 100 MCG/2ML IJ SOLN
INTRAMUSCULAR | Status: AC
  Administered 2020-02-24: 25 ug
  Filled 2020-02-24: qty 2

## 2020-02-24 NOTE — Progress Notes (Signed)
eLink Physician-Brief Progress Note Patient Name: Maurice Nguyen DOB: 11-01-1965 MRN: [id number]   Date of Service  02/24/2020  HPI/Events of Note  Agitation - Very agitated and fighting ventilator post bath. Nursing request for sedation.   eICU Interventions  Plan: 1. Versed 2 mg IV Q 2 hours PRN agitation, sedation or seizures.      Intervention Category Major Interventions: Delirium, psychosis, severe agitation - evaluation and management  [name] 02/24/2020, 9:30 PM

## 2020-02-24 NOTE — Progress Notes (Signed)
Daily Progress Note   Patient Name: Maurice Nguyen       Date: 02/24/2020 DOB: 31-May-1966  Age: 54 y.o. MRN#: [id number] Attending Physician: Rigoberto Noel, MD Primary Care Physician: Bernerd Limbo, MD Admit Date: 01/27/2020  Reason for Consultation/Follow-up: Establishing goals of care  Subjective:does not follow commands, s/o and father at bedside.   Length of Stay: 6  Current Medications: Scheduled Meds:  . chlorhexidine gluconate (MEDLINE KIT)  15 mL Mouth Rinse BID  . Chlorhexidine Gluconate Cloth  6 each Topical Daily  . docusate  100 mg Per Tube BID  . feeding supplement (PROSource TF)  90 mL Per Tube TID  . insulin aspart  0-15 Units Subcutaneous Q4H  . insulin detemir  10 Units Subcutaneous Daily  . mouth rinse  15 mL Mouth Rinse 10 times per day  . pantoprazole (PROTONIX) IV  40 mg Intravenous Q12H  . polyethylene glycol  17 g Per Tube Daily  . sodium chloride flush  10-40 mL Intracatheter Q12H  . valproic acid  500 mg Oral BID    Continuous Infusions: . sodium chloride    . sodium chloride 10 mL/hr at 02/24/20 1442  . amiodarone 30 mg/hr (02/24/20 1400)  . feeding supplement (VITAL AF 1.2 CAL) 30 mL/hr at 02/24/20 1200  . heparin 2,100 Units/hr (02/24/20 1400)  . levETIRAcetam Stopped (02/24/20 0954)  . norepinephrine (LEVOPHED) Adult infusion 4.053 mcg/min (02/24/20 1412)    PRN Meds: acetaminophen (TYLENOL) oral liquid 160 mg/5 mL, docusate, LORazepam, polyethylene glycol, sodium chloride flush  Physical Exam Constitutional:      Comments: Unable to f/c, sedation off, no purposeful movement  Pulmonary:     Comments: intubated Skin:    General: Skin is warm and dry.             Vital Signs: BP 116/77   Pulse 82   Temp 100 F (37.8 C) (Oral)   Resp (!) 32   Ht  '6\' 2"'  (1.88 m)   Wt (!) 164.5 kg   SpO2 99%   BMI 46.56 kg/m  SpO2: SpO2: 99 % O2 Device: O2 Device: Ventilator O2 Flow Rate:    Intake/output summary:   Intake/Output Summary (Last 24 hours) at 02/24/2020 1604 Last data filed at 02/24/2020 1400 Gross per 24 hour  Intake 1885.69 ml  Output 4345 ml  Net -2459.31 ml   LBM: Last BM Date: 02/03/2020 Baseline Weight: Weight: (!) 185.1 kg Most recent weight: Weight: (!) 164.5 kg       Palliative Assessment/Data: PPS 10%      Patient Active Problem List   Diagnosis Date Noted  . Palliative care by specialist   . Goals of care, counseling/discussion   . DNR (do not resuscitate) discussion   . Concern about end of life   . Cardiac arrest ([place]) 01/29/2020  . Asystole ([place])   . Acute respiratory failure ([place])   . No-show for appointment 11/30/2019  . NICM (nonischemic cardiomyopathy) ([place])   . Diabetic foot ulcer ([place]) 10/09/2019  . Diabetic ulcer of left foot ([place]) 10/08/2019  . Acute on chronic combined systolic (congestive) and diastolic (congestive) heart failure ([place]) 10/08/2019  . Hyperlipidemia associated with type 2  diabetes mellitus ([place]) 11/09/2017  . Morbid obesity with BMI of 50.0-59.9, adult ([place]) 11/09/2017  . B12 deficiency 06/09/2017  . Neuropathy associated with endocrine disorder ([place]) 05/03/2017  . Other fatigue 05/03/2017  . OSA (obstructive sleep apnea) 11/11/2016  . Need for hepatitis C screening test 12/01/2015  . Sepsis ([place]) 10/01/2015  . CAP (community acquired pneumonia) 10/01/2015  . Right foot pain 10/01/2015  . AKI (acute kidney injury) ([place]) 10/01/2015  . Hematemesis 10/01/2015  . Chronic bilateral low back pain without sciatica 09/02/2015  . Chronic ulcer of lower extremity ([place]) 04/08/2015  . Chronic combined systolic and diastolic heart failure ([place]) 04/08/2015  . Type 2 diabetes mellitus ([place]) 01/31/2015  . Anticoagulated, on Xarelto, Chads2Vas score 2 10/02/2013  . History of  anticoagulant therapy 10/02/2013  . Atrial fibrillation with RVR 09/27/2013  . Hypertensive urgency 09/27/2013  . Acute combined systolic and diastolic CHF, NYHA class 4, difficult to assess Diastolic function on echo 09/27/2013  . New onset atrial fibrillation ([place]) 08/22/2012  . 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 Obesity, morbid, BMI 50 or higher ([place]) 07/13/2012  . Back pain, chronic 07/13/2012  . Diabetes mellitus type 2 with complications, uncontrolled ([place]) 04/18/2009  . Essential hypertension 04/18/2009  . OTHER POSTSURGICAL STATUS OTHER 04/18/2009    Palliative Care Assessment & Plan   HPI: 54 y.o. male  with past medical history of DM, hypertension, CKD, CHF, atrial fibrillation, and morbid obesity admitted on 02/06/2020 with cardiogenic shock from cardiac arrest in the setting of cocaine use, acute respiratory failure with hypoxia in setting of cardiac arrest, bradycardia.   Family face treatment option decisions, advanced directive decisions, and anticipatory care needs.  Assessment: Spoke to patient's significant other Cathy. We discussed Ms. Staten's lack of improvement. She tells me they have also discussed this with critical care team and they discussed a plan to wait until Wednesday to see how Mr. Brockman does prior to freeing him from medical equipment. For now, they would like to continue all other interventions. On Wednesday, if patient remains the same they plan to transition to comfort measures and allow others to come visit prior to compassionate extubation.  All questions and concerns addressed.   Recommendations/Plan:  Family considering compassionate extubation Wednesday  Palliative team to follow  Code Status:  Limited code  Prognosis:   < 2 weeks  Discharge Planning:  Anticipated Hospital Death  Care plan was discussed with RN and patient's significant other  Thank you for allowing the Palliative Medicine Team to assist in the care of this  patient.   Total Time 15 minutes Prolonged Time Billed  no       Greater than 50%  of this time was spent counseling and coordinating care related to the above assessment and plan.  Juel Burrow, DNP, [facility] Palliative Medicine Team Team Phone # [phone]  Pager [phone]

## 2020-02-24 NOTE — Progress Notes (Signed)
NAME:  Maurice Nguyen, MRN:  [id number], DOB:  21-Jul-1966, LOS: 6 ADMISSION DATE:  2020-02-25, CONSULTATION DATE:  Feb 25, 2020 REFERRING MD:  Renaye Rakers, CHIEF COMPLAINT:  Cardiac arrest  Brief History   54yom s/p asystolic cardiac arrest. downtime.   Hx of NICM, HFrEF, afib, HTN, T2DM who presented via EMS s/p cardiac arrest. Wife notes that she woke up around 0700 at which time her husband was ok. They got up and ate some breakfast and when back to sleep. When she woke back up around 0730, she noted that he had agonal breathing and was non-responsive. EMS was called and pt was noted to have a pulse at that time. Shortly thereafter, he developed asystole, required 8 rounds of cpr. He transitioned into vfib arrest which responded appropriately to one shock. On arrival to the ED, he was noted to be hypothermic with temp of 83.51F. Labs showing trop of 167, K 5.4. Head CT negative for acute process.   Past Medical History  NICM, HFrEF (30-35%), afib, HTN, PAD, T2DM  Significant Hospital Events   7/26 admission 7/27 rewarmed 7/28 worsening epileptiform discharges -- concerning for anoxic brain injury  7/30 Run of VT, Remains on amio, NE, milrinone. Family meeting > plan to reassess in 24-48 hours 7/31 Does not follow commands, non-purposeful movement. Tmax 100.1, tachypnea ? Neuro pattern  Consults:  Cardiology Neurology   Procedures:  7/26 ETT >> 7/26 [facility] >>  Significant Diagnostic Tests:  7/26 CXR>>pulm vascular congestion 7/26 UDS ++ cocaine 7/26 head CT>>no acute findings 7/26 Echo>> EF 20-25%, LV with severely decreased function/ global hypokinesis. LV hypertrophy, RV function is moderately reduced, RV is moderately enlarged. Aortic Dilation 59mm 7/26 LTM EEG >> suppression, generalized  , Polyspikes, generalized 7/27 >> EEG suppression, generalized ,continuous slow, generalized, Periodic epileptiform discharges with triphasic morphology, generalized 7/28 EEG >> worsening  epileptiform discharges  7/29 CT Head without contrast>> No CT evidence of acute intracranial abnormality chronic cortically based infarct within the lateral left occipital lobe. Stable mild chronic small vessel ischemic disease  Micro Data:  7/26 blood cultures >> negative 7/26 urine culture >> negative 7/26 resp culture>> few staph epidermis >> R-erythromycin, oxacillin 7/26 MRSA > neg   Antimicrobials:  Vancomycin 7/26 >> 7/29 Cefepime 7/26 >> 7/31 Flagyl 7/26 x1  Interim history/subjective:  RN reports no acute events overnight.  Sedation turned off at shift change.  Tmax 100.4 I/O UOP 4.7L, -3.4L  Off milrinone, SvO2 78%  Objective   Blood pressure (!) 153/83, pulse 83, temperature (!) 100.4 F (38 C), temperature source Oral, resp. rate 14, height 6\' 2"  (1.88 m), weight (!) 164.5 kg, SpO2 98 %. CVP:  [6 mmHg-11 mmHg] 11 mmHg  Vent Mode: PRVC FiO2 (%):  [40 %] 40 % Set Rate:  [16 bmp] 16 bmp Vt Set:  [650 mL] 650 mL PEEP:  [5 cmH20] 5 cmH20 Plateau Pressure:  [16 cmH20-19 cmH20] 16 cmH20   Intake/Output Summary (Last 24 hours) at 02/24/2020 04/25/2020 Last data filed at 02/24/2020 04/25/2020 Gross per 24 hour  Intake 2272.97 ml  Output 5720 ml  Net -3447.03 ml   Filed Weights   02/22/20 0500 02/23/20 0400 02/24/20 0427  Weight: (!) 168.8 kg (!) 168.3 kg (!) 164.5 kg    Examination: General: obese adult male lying in bed in NAD on vent, critically ill appearing   HEENT: MM pink/moist, ETT Neuro: no response to stimulation, sedation turned off CV: s1s2 irr irr, AF in 70's on monitor, no m/r/g  PULM: non-labored on vent, lungs bilaterally coarse  GI: soft, bsx4 active, OGT in place Extremities: warm/dry, BLE chronic edema / changes consistent with venous stasis   Skin: no rashes or lesions  PCXR 8/1 >> rotated film, cardiomegaly, mild edema  Resolved Hospital Problem list      Assessment & Plan:   Asystolic cardiac arrest/Vfib cardiac arrest NICM with HFrEF Cardiogenic  Shock ROSC achieved around . Possibly precipitated by respiratory arrest as his wife heard agonal breathing prior to losing pulse. Note UDS positive for cocaine. Last echo in March 2021 showing EF of 30-35%, down to 20-25%. Heart cath 09/2019 showed mild non-obstructive disease except for severe disease in a small PL branch. -continue amiodarone, levophed for MAP >65 -tele monitoring  -appreciate Cardiology assistance  Paroxysmal atrial fibrillation Intermittent VTach  CHADSVASc=5. Anticoagulated on xarelto -heparin gtt per pharmacy  -amiodarone as above, tele monitoring  -goal K>4, Mg >2  Acute Anoxic Encephalopathy Epileptiform Discharges  Primary concern right now is hypoxic injury.  Head CT negative.  Neurology has discussed c/f anoxic injury with family. Unable to do MRI brain due to patient size -appreciate Neurology input  -continue keppra -turn off all sedation to allow for neuro prognostication  -follow for seizure activity  -frequent neuro exams   Acute respiratory failure requiring intubation Right Pleural Effusion  -PRVC 8cc/kg as rest mode  -PSV wean but mental status barrier to extubation  -follow intermittent CXR  -advance ETT 1cm   Sepsis -?  Aspiration Pt with history of chronic LLE wound, but doubt this is source -abx as above  -WOC following   Anemia -trend CBC   Transaminitis due to shock liver In setting of cardiac arrest  -follow LFT's   Acute kidney injury  Likely ATN related to hypoperfusion. -Trend BMP / urinary output -Replace electrolytes as indicated -Avoid nephrotoxic agents, ensure adequate renal perfusion  T2DM.  -SSI + levemir   Best practice:  Diet: TF Pain/Anxiety/Delirium protocol (if indicated): Precedex PRN ativan PRN fent  VAP protocol (if indicated): yes DVT prophylaxis: heparin gtt GI prophylaxis: pepcid Glucose control: SSI + basal  Mobility: BR Code Status: Full Family Communication: updated at bedside 7/31 (wife  and father). Disposition: ICU   CC Time: 32 minutes   Canary Brim, MSN, NP-C [facility] 02/24/2020, 7:22 AM   Please see [email or web address] for pager details.

## 2020-02-24 NOTE — Progress Notes (Signed)
Progress Note  Patient Name: Maurice Nguyen Date of Encounter: 02/24/2020  Primary Cardiologist: Mertie Moores, MD   Subjective   Remains intubated, no purposeful movements.   Inpatient Medications    Scheduled Meds: . chlorhexidine gluconate (MEDLINE KIT)  15 mL Mouth Rinse BID  . Chlorhexidine Gluconate Cloth  6 each Topical Daily  . docusate  100 mg Per Tube BID  . feeding supplement (PROSource TF)  90 mL Per Tube TID  . insulin aspart  0-15 Units Subcutaneous Q4H  . insulin detemir  10 Units Subcutaneous Daily  . mouth rinse  15 mL Mouth Rinse 10 times per day  . pantoprazole (PROTONIX) IV  40 mg Intravenous Q12H  . polyethylene glycol  17 g Per Tube Daily  . sodium chloride flush  10-40 mL Intracatheter Q12H  . valproic acid  500 mg Oral BID   Continuous Infusions: . sodium chloride    . sodium chloride Stopped (02/20/20 0021)  . amiodarone 30 mg/hr (02/24/20 0800)  . dexmedetomidine (PRECEDEX) IV infusion Stopped (02/24/20 0550)  . feeding supplement (VITAL AF 1.2 CAL) 1,000 mL (02/24/20 0754)  . fentaNYL infusion INTRAVENOUS Stopped (02/21/20 0559)  . heparin 1,850 Units/hr (02/24/20 0800)  . levETIRAcetam Stopped (02/23/20 2125)  . norepinephrine (LEVOPHED) Adult infusion 5 mcg/min (02/24/20 0800)   PRN Meds: acetaminophen (TYLENOL) oral liquid 160 mg/5 mL, docusate, fentaNYL (SUBLIMAZE) injection, LORazepam, polyethylene glycol, sodium chloride flush   Vital Signs    Vitals:   02/24/20 0545 02/24/20 0700 02/24/20 0746 02/24/20 0800  BP:  (!) 157/94  128/79  Pulse: 83 72  81  Resp:   14   Temp:    100.1 F (37.8 C)  TempSrc:    Oral  SpO2: 98% 97% 98% 98%  Weight:      Height:        Intake/Output Summary (Last 24 hours) at 02/24/2020 0917 Last data filed at 02/24/2020 0800 Gross per 24 hour  Intake 2226.57 ml  Output 5645 ml  Net -3418.43 ml   Filed Weights   02/22/20 0500 02/23/20 0400 02/24/20 0427  Weight: (!) 168.8 kg (!) 168.3 kg (!)  164.5 kg    Telemetry    Atrial fib with a RVR/CVR - Personally Reviewed  ECG    none - Personally Reviewed  Physical Exam   GEN: No acute distress.   Neck: unable to assess JVD Cardiac: IRIRR, no murmurs, rubs, or gallops.  Respiratory: Clear to auscultation bilaterally. GI: Soft, nontender, non-distended  MS: No edema; No deformity. Neuro:  no purposeful movements Psych: unresponsive  Labs    Chemistry Recent Labs  Lab 02/20/20 0405 02/20/20 0405 02/21/20 0221 02/21/20 0518 02/23/20 0357 02/23/20 1627 02/24/20 0243  NA 138   < > 140   < > 144 146* 146*  K 4.7   < > 3.8   < > 3.6 3.8 3.9  CL 100   < > 101   < > 107 108 109  CO2 22   < > 24   < > '26 26 27  '$ GLUCOSE 243*   < > 182*   < > 144* 163* 150*  BUN 49*   < > 56*   < > 42* 35* 29*  CREATININE 3.75*   < > 3.69*   < > 2.13* 1.74* 1.64*  CALCIUM 8.3*   < > 8.3*   < > 8.7* 8.7* 8.7*  PROT 6.8  --  6.5  --  6.8  --   --  ALBUMIN 2.7*  --  2.4*  --  2.4*  --   --   AST 95*  --  39  --  21  --   --   ALT 275*  --  176*  --  88*  --   --   ALKPHOS 82  --  69  --  64  --   --   BILITOT 1.4*  --  1.3*  --  1.2  --   --   GFRNONAA 17*   < > 17*   < > 34* 43* 46*  GFRAA 20*   < > 20*   < > 39* 50* 53*  ANIONGAP 16*   < > 15   < > _0 < > = values in this interval not displayed.     Hematology Recent Labs  Lab 02/22/20 0531 02/23/20 0357 02/24/20 0243  WBC 10.4 9.7 9.3  RBC 3.54* 3.65* 3.76*  HGB 11.2* 11.4* 11.7*  HCT 34.2* 35.6* 37.2*  MCV 96.6 97.5 98.9  MCH 31.6 31.2 31.1  MCHC 32.7 32.0 31.5  RDW 14.1 14.3 14.6  PLT 131* 128* 124*    Cardiac EnzymesNo results for input(s): TROPONINI in the last 168 hours. No results for input(s): TROPIPOC in the last 168 hours.   BNP Recent Labs  Lab 02/02/2020 1030  BNP 158.1*     DDimer No results for input(s): DDIMER in the last 168 hours.   Radiology    DG CHEST PORT 1 VIEW  Result Date: 02/23/2020 CLINICAL DATA:  Respiratory failure EXAM:  PORTABLE CHEST 1 VIEW COMPARISON:  Chest x-rays dated 02/21/2020 and 02/20/2020. FINDINGS: Stable cardiomegaly. Continued central pulmonary vascular congestion. Continued bibasilar opacities, most likely atelectasis and/or small pleural effusions. No pneumothorax is seen. Endotracheal tube appears adequately positioned with tip at the level of the clavicles. Enteric tube passes below the diaphragm. LEFT IJ central line is stable in position with tip at the level of the upper SVC. IMPRESSION: 1. Stable cardiomegaly with central pulmonary vascular congestion suggesting mild CHF/volume overload. 2. Continued bibasilar opacities, most likely atelectasis and/or small pleural effusions. 3. Support apparatus appears stable in position. Electronically Signed   By: Franki Cabot M.D.   On: 02/23/2020 12:23    Cardiac Studies   none  Patient Profile     54 y.o. male admitted with VF arrest, 30 minute down time, now with VDRF  Assessment & Plan    1. VDRF - he has undergone weaning trials and will continue today.  2. VF arrest - he has had no additional VF. Continue IV amiodarone for now 3. Atrial fib - he remains in atrial fib. Rate is reasonably well controlled. Continue IV amio for now. 4. Anoxic encephalopathy - still no purposeful movements off of sedation. Prognosis seems poor at this point.    For questions or updates, please contact [place] Please consult [email or web address] for contact info under Cardiology/STEMI.   Signed, Cristopher Peru, MD  02/24/2020, 9:17 AM  Patient ID: Maurice Nguyen, male   DOB: 1966/03/28, 54 y.o.   MRN: [id number]

## 2020-02-24 NOTE — Progress Notes (Signed)
ANTICOAGULATION CONSULT NOTE  Pharmacy Consult for heparin Indication: atrial fibrillation  Assessment: 54 yom on Xarelto PTA for afib presenting s/p CPR with ROSC, intubated and code cool initiated. Pharmacy consulted to dose heparin for afib.   Heparin level still below goal this morning. Hgb stable in 11s. No bleeding issues have been noted.   Goal of Therapy:  Heparin level 0.3-0.7 units/ml Monitor platelets by anticoagulation protocol: Yes   Plan:  Increase heparin to 2100 units/hr Check heparin level in am  Thanks for allowing pharmacy to be a part of this patient's care.  Sheppard Coil PharmD., BCPS Clinical Pharmacist 02/24/2020 11:25 AM

## 2020-02-24 DEATH — deceased

## 2020-02-25 ENCOUNTER — Inpatient Hospital Stay (HOSPITAL_COMMUNITY): Payer: Medicare HMO

## 2020-02-25 DIAGNOSIS — Z8669 Personal history of other diseases of the nervous system and sense organs: Secondary | ICD-10-CM

## 2020-02-25 DIAGNOSIS — I493 Ventricular premature depolarization: Secondary | ICD-10-CM

## 2020-02-25 DIAGNOSIS — I4891 Unspecified atrial fibrillation: Secondary | ICD-10-CM

## 2020-02-25 LAB — BASIC METABOLIC PANEL
Anion gap: 10 (ref 5–15)
BUN: 38 mg/dL — ABNORMAL HIGH (ref 6–20)
CO2: 26 mmol/L (ref 22–32)
Calcium: 8.6 mg/dL — ABNORMAL LOW (ref 8.9–10.3)
Chloride: 112 mmol/L — ABNORMAL HIGH (ref 98–111)
Creatinine, Ser: 1.78 mg/dL — ABNORMAL HIGH (ref 0.61–1.24)
GFR calc Af Amer: 48 mL/min — ABNORMAL LOW (ref >60)
GFR calc non Af Amer: 42 mL/min — ABNORMAL LOW (ref >60)
Glucose, Bld: 189 mg/dL — ABNORMAL HIGH (ref 70–99)
Potassium: 3.6 mmol/L (ref 3.5–5.1)
Sodium: 148 mmol/L — ABNORMAL HIGH (ref 135–145)

## 2020-02-25 LAB — TYPE AND SCREEN
ABO/RH(D): A POS
Antibody Screen: NEGATIVE
Unit division: 0

## 2020-02-25 LAB — CBC
HCT: 35.6 % — ABNORMAL LOW (ref 39.0–52.0)
Hemoglobin: 11 g/dL — ABNORMAL LOW (ref 13.0–17.0)
MCH: 31.7 pg (ref 26.0–34.0)
MCHC: 30.9 g/dL (ref 30.0–36.0)
MCV: 102.6 fL — ABNORMAL HIGH (ref 80.0–100.0)
Platelets: 138 10*3/uL — ABNORMAL LOW (ref 150–400)
RBC: 3.47 MIL/uL — ABNORMAL LOW (ref 4.22–5.81)
RDW: 14.8 % (ref 11.5–15.5)
WBC: 12.2 10*3/uL — ABNORMAL HIGH (ref 4.0–10.5)
nRBC: 0.2 % (ref 0.0–0.2)

## 2020-02-25 LAB — BPAM RBC
Blood Product Expiration Date: 202108182359
ISSUE DATE / TIME: 202107260808
Unit Type and Rh: 6200

## 2020-02-25 LAB — GLUCOSE, CAPILLARY
Glucose-Capillary: 121 mg/dL — ABNORMAL HIGH (ref 70–99)
Glucose-Capillary: 134 mg/dL — ABNORMAL HIGH (ref 70–99)
Glucose-Capillary: 148 mg/dL — ABNORMAL HIGH (ref 70–99)
Glucose-Capillary: 149 mg/dL — ABNORMAL HIGH (ref 70–99)
Glucose-Capillary: 177 mg/dL — ABNORMAL HIGH (ref 70–99)
Glucose-Capillary: 185 mg/dL — ABNORMAL HIGH (ref 70–99)

## 2020-02-25 LAB — COOXEMETRY PANEL
Carboxyhemoglobin: 1.5 % (ref 0.5–1.5)
Methemoglobin: 0.9 % (ref 0.0–1.5)
O2 Saturation: 78.1 %
Total hemoglobin: 11.5 g/dL — ABNORMAL LOW (ref 12.0–16.0)

## 2020-02-25 LAB — MAGNESIUM: Magnesium: 1.9 mg/dL (ref 1.7–2.4)

## 2020-02-25 LAB — TRIGLYCERIDES: Triglycerides: 160 mg/dL — ABNORMAL HIGH (ref <150)

## 2020-02-25 LAB — HEPARIN LEVEL (UNFRACTIONATED): Heparin Unfractionated: 0.36 IU/mL (ref 0.30–0.70)

## 2020-02-25 MED ORDER — POTASSIUM CHLORIDE 20 MEQ PO PACK
40.0000 meq | PACK | Freq: Once | ORAL | Status: AC
  Administered 2020-02-25: 40 meq via ORAL
  Filled 2020-02-25: qty 2

## 2020-02-25 MED ORDER — VITAL HIGH PROTEIN PO LIQD
1000.0000 mL | ORAL | Status: DC
  Administered 2020-02-25 – 2020-02-27 (×3): 1000 mL

## 2020-02-25 MED ORDER — FREE WATER
200.0000 mL | Status: DC
  Administered 2020-02-25 – 2020-02-27 (×14): 200 mL

## 2020-02-25 MED ORDER — PROSOURCE TF PO LIQD
90.0000 mL | Freq: Four times a day (QID) | ORAL | Status: DC
  Administered 2020-02-25 – 2020-02-27 (×9): 90 mL
  Filled 2020-02-25 (×9): qty 90

## 2020-02-25 MED ORDER — PROPOFOL 1000 MG/100ML IV EMUL
5.0000 ug/kg/min | INTRAVENOUS | Status: DC
  Administered 2020-02-25: 50 ug/kg/min via INTRAVENOUS
  Administered 2020-02-25: 40 ug/kg/min via INTRAVENOUS
  Administered 2020-02-25: 50 ug/kg/min via INTRAVENOUS
  Administered 2020-02-25: 10 ug/kg/min via INTRAVENOUS
  Administered 2020-02-26 (×2): 30 ug/kg/min via INTRAVENOUS
  Administered 2020-02-26: 40 ug/kg/min via INTRAVENOUS
  Administered 2020-02-26 (×3): 30 ug/kg/min via INTRAVENOUS
  Administered 2020-02-27: 20 ug/kg/min via INTRAVENOUS
  Administered 2020-02-27 (×3): 30 ug/kg/min via INTRAVENOUS
  Filled 2020-02-25 (×2): qty 100
  Filled 2020-02-25: qty 200
  Filled 2020-02-25 (×2): qty 100
  Filled 2020-02-25: qty 200
  Filled 2020-02-25 (×2): qty 100
  Filled 2020-02-25: qty 200
  Filled 2020-02-25: qty 100
  Filled 2020-02-25: qty 200
  Filled 2020-02-25 (×3): qty 100

## 2020-02-25 MED ORDER — METOLAZONE 5 MG PO TABS
5.0000 mg | ORAL_TABLET | Freq: Once | ORAL | Status: AC
  Administered 2020-02-25: 5 mg via ORAL
  Filled 2020-02-25: qty 1

## 2020-02-25 NOTE — Progress Notes (Addendum)
NAME:  Maurice Nguyen, MRN:  [id number], DOB:  [birth year], LOS: 7 ADMISSION DATE:  02/23/2020, CONSULTATION DATE: February 23, 2020 REFERRING MD: Renaye Rakers, CHIEF COMPLAINT: Cardiac arrest  Brief History   Maurice Nguyen is a 54 year old male with past medical history of heart failure reduced ejection fraction (EF of 30 to 35%), hypertension, diabetes who was admitted after asystole cardiac arrest requiring 8 rounds of CPR (~ 30 minutes).  He then transition to V. fib which responded to 1 shock.On arrival to the ED, he was noted to be hypothermic with temp of 83.86F. Labs showing trop of 167, K 5.4. Head CT negative for acute process.    History of present illness   Patient is sedated on ventilator, not respond to verbal stimulation.  Discussed with family in the room about patient's prognosis.  Will wait until Wednesday to see if there is any improvement or consider palliative care.   Past Medical History  NICM, HFrEF (30-35%), afib, HTN, PAD, T2DM  Significant Hospital Events   7/26 admission 7/27 rewarmed 7/28 worsening epileptiform discharges -- concerning for anoxic brain injury  7/30 Run of VT, Remains on amio, NE, milrinone. Family meeting > plan to reassess in 24-48 hours 7/31 Does not follow commands, non-purposeful movement. Tmax 100.1, tachypnea ? Neuro pattern  Consults:  Cardiology Neurology Palliative  Procedures:  7/26 ETT >> 7/26 [facility] >>  Significant Diagnostic Tests:  7/26 CXR>>pulm vascular congestion 7/26 UDS ++ cocaine 7/26 head CT>>no acute findings 7/26 Echo>> EF 20-25%, LV with severely decreased function/ global hypokinesis. LV hypertrophy, RV function is moderately reduced, RV is moderately enlarged. 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 Stable mild chronic small vessel ischemic disease  Micro Data:  7/26 blood cultures >> negative 7/26 urine culture >> negative 7/26 resp culture>> few staph epidermis >> R-erythromycin, oxacillin 7/26 MRSA > neg   Antimicrobials:  Vancomycin 7/26 >> 7/29 Cefepime 7/26 >> 7/31 Flagyl 7/26 x1  Interim history/subjective:  Patient is sedated on ventilator.  No purposeful movement  Objective   Blood pressure (!) 99/50, pulse 97, temperature 98.5 F (36.9 C), resp. rate (!) 30, height 6\' 2"  (1.88 m), weight (!) 160.1 kg, SpO2 97 %. CVP:  [8 mmHg-16 mmHg] 9 mmHg  Vent Mode: PSV;CPAP FiO2 (%):  [40 %] 40 % Set Rate:  [16 bmp] 16 bmp Vt Set:  [650 mL] 650 mL PEEP:  [5 cmH20] 5 cmH20 Pressure Support:  [10 cmH20-12 cmH20] 12 cmH20 Plateau Pressure:  [17 cmH20-18 cmH20] 18 cmH20   Intake/Output Summary (Last 24 hours) at 02/25/2020 1123 Last data filed at 02/25/2020 1100 Gross per 24 hour  Intake 1564.02 ml  Output 1325 ml  Net 239.02 ml   Filed Weights   02/23/20 0400 02/24/20 0427 02/25/20 0500  Weight: (!) 168.3 kg (!) 164.5 kg (!) 160.1 kg    Examination: Physical Exam Constitutional:      General: He is not in acute distress.    Appearance: He is obese.     Comments: Sedated on ventilator, not responsive to verbal stimulation, no purposeful movements  HENT:     Head: Normocephalic.  Eyes:     Comments: Pinpoint pupil, equal bilateral  Cardiovascular:  Rate and Rhythm: Normal rate and regular rhythm.     Heart sounds: No murmur heard.   Pulmonary:     Effort: Pulmonary effort is normal. No respiratory distress.  Abdominal:     General: There is no distension.     Palpations: Abdomen is soft.  Musculoskeletal:     Right lower leg: Edema (Trace) present.     Left lower leg: Edema (Trace) present.    Skin:    General: Skin is warm.     Coloration: Skin is not jaundiced.  Neurological:     Comments: [facility] Problem list     Assessment & Plan:  Cardiac arrest Acute hypoxic encephalopathy Acute hypoxic respiratory failure requiring mechanical ventilation Right pleural effusion Paroxysmal A. fib AKI Type 2 diabetes  Plan: Acute hypoxic encephalopathy secondary to cardiac arrest  Due to prolonged time to achieve ROSC (~30 minutes), hypoxic brain injury is expected to be severe. Patient is unlikely to have a good prognosis.  Neurology is on board with treatment planned and have discussed goal of care with family.  Palliative care was consulted.  Per family's wishes, continue intervention until Wednesday and if no improvement observed, will consider to proceed with comfort care -Continue Keppra 500 mg twice daily -Continue valproic acid 500 mg twice daily -Continue Ativan IV as needed -Continue to observe for any neurological improvements   Acute hypoxic respiratory failure requiring mechanical ventilation Right pulmonary effusion Chest x-ray 8/2 shows improvement of right lung base opacity.  Bilateral atelectasis persists.  Continue patient on ventilator.  Patient is not a candidate for extubation due to his mental status.  Sedation with propofol. -PRVC, FiO2 40, PEEP of 5, tidal volume 650, respiratory rate 16 -Sedation with propofol   Cardiogenic shock Heart failure with reduced ejection fraction Echocardiogram 7/26 shows EF of 20 to 25%, global hypokinesis of left ventricle, with mild dilation of aortic root. -Continue Levophed to maintain MAP greater than 65   Paroxysmal A. Fib V. fib cardiac arrest Rate is relatively under controlled.  Telemetry shows normal sinus rhythm.  Patient was on Xarelto at home.  Chads score was 5.  Cardiology is on board. -Appreciate cardiology recommendations -Continue amiodarone drip -Heparin infusion for  DVT   AKI Likely prerenal secondary to hypoperfusion.  Baseline creatinine of 1.1 in April. -Continue normal saline infusion at 10 cc/h to ensure adequate renal perfusion -Continue free water flushes with tube feed -BMP daily -Check magnesium in a.m. -Metolazone was given once for edema   Best practice:  Diet: Prosource, vital AF Pain/Anxiety/Delirium protocol (if indicated): Versed as needed, sedation with propofol VAP protocol (if indicated): N/A DVT prophylaxis: Heparin GI prophylaxis: Protonix Glucose control: Levemir 10 units daily and sliding scale Mobility: Bed rest Code Status: Partial Family Communication: Palliative team Disposition: Continue interventions and watch for improvements until Wednesday.  If not will consider transition to comfort care   Critical care time: 20 min

## 2020-02-25 NOTE — Progress Notes (Signed)
EEG complete - results pending 

## 2020-02-25 NOTE — Progress Notes (Signed)
Progress Note  Patient Name: Maurice Nguyen Date of Encounter: 02/25/2020  Primary Cardiologist: Mertie Moores, MD   Subjective   Intubated, on propofol due to agitation.  No purposeful movement.  Inpatient Medications    Scheduled Meds: . chlorhexidine gluconate (MEDLINE KIT)  15 mL Mouth Rinse BID  . Chlorhexidine Gluconate Cloth  6 each Topical Daily  . docusate  100 mg Per Tube BID  . feeding supplement (PROSource TF)  90 mL Per Tube TID  . free water  200 mL Per Tube Q4H  . insulin aspart  0-15 Units Subcutaneous Q4H  . insulin detemir  10 Units Subcutaneous Daily  . mouth rinse  15 mL Mouth Rinse 10 times per day  . metolazone  5 mg Oral Once  . pantoprazole (PROTONIX) IV  40 mg Intravenous Q12H  . polyethylene glycol  17 g Per Tube Daily  . sodium chloride flush  10-40 mL Intracatheter Q12H  . valproic acid  500 mg Oral BID   Continuous Infusions: . sodium chloride    . sodium chloride 10 mL/hr at 02/25/20 0652  . amiodarone 30 mg/hr (02/25/20 2671)  . feeding supplement (VITAL AF 1.2 CAL) 30 mL/hr at 02/24/20 1200  . heparin 2,100 Units/hr (02/25/20 2458)  . levETIRAcetam 500 mg (02/25/20 0759)  . norepinephrine (LEVOPHED) Adult infusion Stopped (02/25/20 0546)  . propofol (DIPRIVAN) infusion 10 mcg/kg/min (02/25/20 0829)   PRN Meds: acetaminophen (TYLENOL) oral liquid 160 mg/5 mL, docusate, midazolam, polyethylene glycol, sodium chloride flush   Vital Signs    Vitals:   02/25/20 0630 02/25/20 0645 02/25/20 0734 02/25/20 0756  BP: (!) 131/75 (!) 133/68  (!) 146/84  Pulse: (!) 107 98  (!) 119  Resp:    (!) 30  Temp:   98.5 F (36.9 C)   TempSrc:      SpO2: 98% 96%  100%  Weight:      Height:        Intake/Output Summary (Last 24 hours) at 02/25/2020 0838 Last data filed at 02/25/2020 0998 Gross per 24 hour  Intake 1377.71 ml  Output 1275 ml  Net 102.71 ml   Filed Weights   02/23/20 0400 02/24/20 0427 02/25/20 0500  Weight: (!) 168.3 kg (!)  164.5 kg (!) 160.1 kg    Telemetry    Atrial fib with rates 90-120s, frequent PVCs- Personally Reviewed  ECG    none - Personally Reviewed  Physical Exam   GEN: No acute distress.   Neck: unable to assess JVD Cardiac: IRIRR, no murmurs, rubs, or gallops.  Respiratory: Diffuse rhonchi GI: Soft, nontender, non-distended  MS: No edema; No deformity. Neuro:  no purposeful movements Psych: unresponsive  Labs    Chemistry Recent Labs  Lab 02/20/20 0405 02/20/20 0405 02/21/20 0221 02/21/20 0518 02/23/20 3382 02/23/20 0357 02/23/20 1627 02/24/20 0243 02/25/20 0218  NA 138   < > 140   < > 144   < > 146* 146* 148*  K 4.7   < > 3.8   < > 3.6   < > 3.8 3.9 3.6  CL 100   < > 101   < > 107   < > 108 109 112*  CO2 22   < > 24   < > 26   < > '26 27 26  ' GLUCOSE 243*   < > 182*   < > 144*   < > 163* 150* 189*  BUN 49*   < > 56*   < >  42*   < > 35* 29* 38*  CREATININE 3.75*   < > 3.69*   < > 2.13*   < > 1.74* 1.64* 1.78*  CALCIUM 8.3*   < > 8.3*   < > 8.7*   < > 8.7* 8.7* 8.6*  PROT 6.8  --  6.5  --  6.8  --   --   --   --   ALBUMIN 2.7*  --  2.4*  --  2.4*  --   --   --   --   AST 95*  --  39  --  21  --   --   --   --   ALT 275*  --  176*  --  88*  --   --   --   --   ALKPHOS 82  --  69  --  64  --   --   --   --   BILITOT 1.4*  --  1.3*  --  1.2  --   --   --   --   GFRNONAA 17*   < > 17*   < > 34*   < > 43* 46* 42*  GFRAA 20*   < > 20*   < > 39*   < > 50* 53* 48*  ANIONGAP 16*   < > 15   < > 11   < > '12 10 10   ' < > = values in this interval not displayed.     Hematology Recent Labs  Lab 02/23/20 0357 02/24/20 0243 02/25/20 0218  WBC 9.7 9.3 12.2*  RBC 3.65* 3.76* 3.47*  HGB 11.4* 11.7* 11.0*  HCT 35.6* 37.2* 35.6*  MCV 97.5 98.9 102.6*  MCH 31.2 31.1 31.7  MCHC 32.0 31.5 30.9  RDW 14.3 14.6 14.8  PLT 128* 124* 138*    Cardiac EnzymesNo results for input(s): TROPONINI in the last 168 hours. No results for input(s): TROPIPOC in the last 168 hours.    BNP Recent Labs  Lab 02/02/2020 1030  BNP 158.1*     DDimer No results for input(s): DDIMER in the last 168 hours.   Radiology    DG CHEST PORT 1 VIEW  Result Date: 02/25/2020 CLINICAL DATA:  Hypoxia EXAM: PORTABLE CHEST 1 VIEW COMPARISON:  February 24, 2020 FINDINGS: Endotracheal tube tip is 5.7 cm above the carina. Nasogastric tube tip and side port are below the diaphragm. Central catheter tip is in the left innominate vein near the junction with the left jugular vein. No pneumothorax. There is a small left pleural effusion. There is bibasilar atelectasis. No appreciable consolidation. The heart size is normal. There is mild pulmonary venous hypertension. No adenopathy. No bone lesions. IMPRESSION: Tube and catheter positions as described without pneumothorax. Cardiomegaly with a degree of pulmonary vascular congestion. Bibasilar atelectasis without edema or consolidation. Electronically Signed   By: Lowella Grip III M.D.   On: 02/25/2020 08:09   DG CHEST PORT 1 VIEW  Result Date: 02/24/2020 CLINICAL DATA:  Acute respiratory failure with hypoxia. EXAM: PORTABLE CHEST 1 VIEW COMPARISON:  02/23/2020 and earlier exams. FINDINGS: Mild persistent interstitial thickening and central vascular congestion. Base opacities which appear mildly improved on the right but stable on the left, most likely combination atelectasis and small effusions. No pneumothorax. Endotracheal tube, nasal/orogastric tube and left internal jugular central venous line stable. IMPRESSION: 1. Right lung base opacity appears mildly improved from the previous day's exam, consistent with improved atelectasis. Residual lung base  opacities are most likely due to residual atelectasis and small effusions. 2. Persistent mild vascular congestion and interstitial thickening. No new lung abnormalities. 3. Stable support apparatus. Electronically Signed   By: Lajean Manes M.D.   On: 02/24/2020 09:31    Cardiac Studies   TTE  02/15/2020: 1. Left ventricular ejection fraction, by estimation, is 20 to 25%. The  left ventricle has severely decreased function. The left ventricle  demonstrates global hypokinesis. There is mild concentric left ventricular  hypertrophy. Left ventricular diastolic  function could not be evaluated.  2. Right ventricular systolic function is moderately reduced. The right  ventricular size is moderately enlarged.  3. The mitral valve is grossly normal. No evidence of mitral valve  regurgitation. No evidence of mitral stenosis.  4. The aortic valve is tricuspid. Aortic valve regurgitation is not  visualized. No aortic stenosis is present.  5. Aortic dilatation noted. There is mild dilatation of the aortic root  measuring 41 mm.   Patient Profile     54 y.o. male admitted with VF arrest, 30 minute down time, now with VDRF  Assessment & Plan    VF arrest - he has had no additional VF. Continue IV amiodarone  Atrial fib - he remains in atrial fib. Rate is reasonably well controlled. Continue IV amio  Anoxic encephalopathy -  no purposeful movements. Palliative medicine consulted, planning palliative extubation if no improvement by Wednesday  Frequent PVCs: maintain K>4, Mag>2  For questions or updates, please contact [facility] Please consult [email or web address] for contact info under Cardiology/STEMI.   Signed, Donato Heinz, MD  02/25/2020, 8:38 AM  Patient ID: Maurice Nguyen, male   DOB: 1966/01/23, 54 y.o.   MRN: [id number]

## 2020-02-25 NOTE — Progress Notes (Signed)
ANTICOAGULATION CONSULT NOTE  Pharmacy Consult for heparin Indication: atrial fibrillation  Assessment: 54 yom on Xarelto PTA for afib presenting s/p CPR with ROSC, intubated and code cool initiated. Pharmacy consulted to dose heparin for afib.   Heparin level is therapeutic at 0.36, on 2100 units/hr. Hgb 11, plt 138. No s/sx of bleeding or infusion issues.   Goal of Therapy:  Heparin level 0.3-0.7 units/ml Monitor platelets by anticoagulation protocol: Yes   Plan:  Continue heparin at 2100 units/hr Check heparin level in am  Thanks for allowing pharmacy to be a part of this patient's care.  Sherron Monday, PharmD, BCCCP Clinical Pharmacist  Phone: [phone] 02/25/2020 8:35 AM  Please check AMION for all [facility] Pharmacy phone numbers After 10:00 PM, call Main Pharmacy [phone]

## 2020-02-25 NOTE — Progress Notes (Signed)
Subjective: Per RN, patient continues to have nonpurposeful movements of his extremities at times with increased tone and therefore had to be restarted on propofol.  Patient's father at bedside.   ROS: unable to obtain due to poor mental status  Examination  Vital signs in last 24 hours: Temp:  [97.7 F (36.5 C)-100.5 F (38.1 C)] 97.7 F (36.5 C) (08/02 1132) Pulse Rate:  [75-119] 90 (08/02 1203) Resp:  [16-32] 16 (08/02 1203) BP: (98-166)/(50-105) 99/74 (08/02 1203) SpO2:  [90 %-100 %] 97 % (08/02 1203) FiO2 (%):  [40 %] 40 % (08/02 1203) Weight:  [160.1 kg] 160.1 kg (08/02 0500)  General: lying in bed,not in apparent distress CVS: pulse-normal rate and rhythm QB:[id number], initiating spontaneous breaths Extremities: normal,warm Neuro:Comatose, does not open eyes to noxious stimuli, pupils small but equally round and reactive, corneal reflex intact, gag reflex intact, withdraws to noxious stimuli in all extremities.  Basic Metabolic Panel: Recent Labs  Lab 02/19/20 0412 02/19/20 0414 02/21/20 0518 02/21/20 0525 02/22/20 0531 02/22/20 0531 02/23/20 0357 02/23/20 0357 02/23/20 1627 02/24/20 0243 02/25/20 0218  NA 138   < >  --    < > 142  --  144  --  146* 146* 148*  K 4.5   < > 3.8   < > 3.9  --  3.6  --  3.8 3.9 3.6  CL 103   < >  --    < > 104  --  107  --  108 109 112*  CO2 19*   < >  --    < > 26  --  26  --  26 27 26   GLUCOSE 211*   < >  --    < > 187*  --  144*  --  163* 150* 189*  BUN 38*   < >  --    < > 52*  --  42*  --  35* 29* 38*  CREATININE 2.74*   < >  --    < > 2.70*  --  2.13*  --  1.74* 1.64* 1.78*  CALCIUM 8.3*   < >  --    < > 8.5*   < > 8.7*   < > 8.7* 8.7* 8.6*  MG 1.7   < > 1.7  --  1.8  --  1.8  --  1.8  --  1.9  PHOS 4.1  --  5.9*  --  4.6  --   --   --   --   --   --    < > = values in this interval not displayed.    CBC: Recent Labs  Lab 02/21/20 0221 02/21/20 0221 02/21/20 0525 02/22/20 0531 02/23/20 0357 02/24/20 0243  02/25/20 0218  WBC 13.1*  --   --  10.4 9.7 9.3 12.2*  HGB 11.8*   < > 12.6* 11.2* 11.4* 11.7* 11.0*  HCT 35.7*   < > 37.0* 34.2* 35.6* 37.2* 35.6*  MCV 95.5  --   --  96.6 97.5 98.9 102.6*  PLT 133*  --   --  131* 128* 124* 138*   < > = values in this interval not displayed.     Coagulation Studies: No results for input(s): LABPROT, INR in the last 72 hours.  Imaging No new brain imaging overnight.  ASSESSMENT AND PLAN: 54 year old male with multiple comorbidities as noted above presented after cardiac arrest status post TTM,total downtime about 30 minutes. Continuous EEG showed generalized periodic epileptiform  discharges, currently on Keppra.  Status epilepticus Cardiac arrest status post TTM Suspected anoxic/hypoxic brain injury - Given patient;s prolonged downtime, neuro exam, eeg findings, I am concerned that patient has suffered irreversible devastating anoxic/hypoxic neurologic injury with slim to no chances of meaningful neurologic recovery.  Recommendations -We will obtain routine EEG to look for seizures as patient has had episodes of involuntary arm movement and increased tone. -Continue keppra and VPA -Patient's family has decided to wait till Wednesday to look for neurological improvement.  If patient continues to have similar exam, family plans to transition to comfort care - IV Ativan 2mg  for any clinical seizure like activity -Management of rest of comorbidities per primary team  CRITICAL CARE Performed by:   Total critical care time:49minutes  Critical care time was exclusive of separately billable procedures and treating other patients.  Critical care was necessary to treat or prevent imminent or life-threatening deterioration.  Critical care was time spent personally by me on the following activities: development of treatment plan with patient and/or surrogate as well as nursing, discussions with consultants, evaluation of  patient's response to treatment, examination of patient, obtaining history from patient or surrogate, ordering and performing treatments and interventions, ordering and review of laboratory studies, ordering and review of radiographic studies, pulse oximetry and re-evaluation of patient's condition.  21m Epilepsy [facility] For questions after 5pm please refer to AMION to reach the Neurologist on call

## 2020-02-25 NOTE — Progress Notes (Signed)
Patient ID: Maurice Nguyen, male   DOB: January 16, 1966, 54 y.o.   MRN: [id number]  This NP visited patient at the bedside as a follow up for palliative medicine needs and emotional support.  This patient was initially seen by the palliative medicine team on 02/22/2020.  [name] is a 54 year old male with a past medical history significant for DM, hypertension, CKD, CHF, atrial fibrillation, and morbid obesityadmitted on7/26/2021with cardiogenic shock from cardiac arrestin the setting of cocaine use, acute respiratory failure with hypoxia in setting of cardiac arrest, bradycardia.  Today is day 7 of this hospitalization, EEG suggestive of profound diffuse encephalopathy likely related to an anoxic brain injury.  Patient is unresponsive to verbal stimuli, unable to follow commands but does withdrawal to painful stimuli.  Family face treatment option decisions, advanced directive decisions, and anticipatory care needs.  Father at bedside.  Created space and opportunity for father to explore thoughts and feelings regarding the patient's current medical situation.  He verbalizes an understanding of the seriousness of the situation however he remains hopeful "for a miracle".  He speaks to his knowing that his son would not want to live dependent on machines or in a nursing facility for the rest of his life. He tells me that the decision to liberate the patient from life-prolonging machines will be made on Wednesday.  Education offered and discussion had regarding human mortality and the limitations of medical interventions to prolong quality of life when the body fails.  Education/discussion regarding anoxic brain injury and the long-term poor prognosis.  Emotional support offered.  Discussed with father the importance of continued conversation with his family and the medical providers regarding overall plan of care and treatment options,  ensuring decisions are within the context of the patients  values and GOCs.  Questions and concerns addressed   Discussed with Dr Katrinka Blazing and Dr. Melynda Ripple  Total time spent on the unit was 35 minutes   PMT will continue to support holistically.  Patient's father is encouraged to call with questions or concerns.  Greater than 50% of the time was spent in counseling and coordination of care  Lorinda Creed NP  Palliative Medicine Team Team Phone # [phone] Pager [phone]

## 2020-02-25 NOTE — Progress Notes (Signed)
Nutrition Follow Up  DOCUMENTATION CODES:   Morbid obesity  INTERVENTION:   Change Tube Feeding:  -Vital High Protein @ 45 ml/hr via OG -90 ml ProSource QID -Free water flushes 200 ml Q4 hours- per CCM  Provides: 1400 kcals (2667 kcal with propofol), 183 grams protein, 903 ml free water.   NUTRITION DIAGNOSIS:   Increased nutrient needs related to acute illness as evidenced by estimated needs.  Ongoing  GOAL:   Patient will meet greater than or equal to 90% of their needs   Addressed via TF  MONITOR:   Weight trends, Diet advancement, Vent status, Skin, TF tolerance, Labs, I & O's  REASON FOR ASSESSMENT:   Ventilator, Consult Enteral/tube feeding initiation and management  ASSESSMENT:   Patient with PMH significant for HTN, PAD, CHF, and DM. Presents this admission after cardiac arrest.   Pt discussed during ICU rounds and with RN.   Pt without purposeful movement. Hypernatremic. Febrile. Family desires 24-48 hrs to assess for improvement. Remains on high dose propofol. Had vomiting episode last week but has tolerated Vital AF 1.2 @ 30 ml/hr. Change formula to better meet needs. Monitor for GOC.   Admission weight: 168.7 kg Current weight: 160.1 kg   Patient remains intubated on ventilator support MV: 10.2 L/min Temp (24hrs), Avg:99.2 F (37.3 C), Min:97.7 F (36.5 C), Max:100.5 F (38.1 C)  Propofol: 48 ml/hr- provides 1267 kcal from lipids daily   I/O: -800 ml since admit  UOP: 1,425 ml x 24 hrs   Drips: propofol Medications: colace, SS novolog, levemir, miralax Labs: Na 148 (H) CBG 121-161  Diet Order:   Diet Order    None      EDUCATION NEEDS:   Not appropriate for education at this time  Skin:  Skin Assessment: Skin Integrity Issues: Skin Integrity Issues:: Diabetic Ulcer Diabetic Ulcer: L foot- chronic  Last BM:  8/1  Height:   Ht Readings from Last 1 Encounters:  03/05/2020 6\' 2"  (1.88 m)    Weight:   Wt Readings from Last 1  Encounters:  02/25/20 (!) 160.1 kg    Ideal Body Weight:  86.4 kg  BMI:  Body mass index is 45.32 kg/m.  Estimated Nutritional Needs:   Kcal:  04/26/20 kcal  Protein:  173-216 grams  Fluid:  >/= 1.8 L/day   [id number] RD, LDN Clinical Nutrition Pager listed in AMION

## 2020-02-25 NOTE — Procedures (Signed)
Patient Name: Maurice Nguyen  MRN: [id number]  Epilepsy Attending: Charlsie Quest  Referring Physician/Provider: Dr Lindie Spruce Date: 02/25/2020 Duration: 26.09 mins  Patient history: 54yo M s/p cardiac arrest and TTM. EEG to evaluate for seizure.   Level of alertness:  comatose  AEDs during EEG study:  Keppra, valproic acid, propofol  Technical aspects: This EEG study was done with scalp electrodes positioned according to the 10-20 International system of electrode placement. Electrical activity was acquired at a sampling rate of 500Hz  and reviewed with a high frequency filter of 70Hz  and a low frequency filter of 1Hz . EEG data were recorded continuously and digitally stored.   Description: EEG showing burst suppression pattern with 4 to 5 seconds of EEG suppression as well as intermittent 1 to 2 seconds of generalized 5 to 6 Hz theta slowing. EEG is reactive to noxious stimulation.  Hyperventilation and photic stimulation were not performed.     ABNORMALITY -Burst suppression, generalized  IMPRESSION: This study showed evidence of profound diffuse encephalopathy, nonspecific etiology but likely related to sedation, anoxic/hypoxic brain injury.  No seizures or epileptiform discharges were seen during the study.  Kaylum Shrum 

## 2020-02-25 NOTE — Progress Notes (Signed)
NAME:  Maurice Nguyen, MRN:  [id number], DOB:  Jul 17, 1966, LOS: 7 ADMISSION DATE:  Mar 02, 2020, CONSULTATION DATE:  March 02, 2020 REFERRING MD:  Renaye Rakers, CHIEF COMPLAINT:  Cardiac arrest  Brief History   54yom s/p asystolic cardiac arrest. downtime.   Hx of NICM, HFrEF, afib, HTN, T2DM who presented via EMS s/p cardiac arrest. Wife notes that she woke up around 0700 at which time her husband was ok. They got up and ate some breakfast and when back to sleep. When she woke back up around 0730, she noted that he had agonal breathing and was non-responsive. EMS was called and pt was noted to have a pulse at that time. Shortly thereafter, he developed asystole, required 8 rounds of cpr. He transitioned into vfib arrest which responded appropriately to one shock. On arrival to the ED, he was noted to be hypothermic with temp of 83.70F. Labs showing trop of 167, K 5.4. Head CT negative for acute process.   Past Medical History  NICM, HFrEF (30-35%), afib, HTN, PAD, T2DM  Significant Hospital Events   7/26 admission 7/27 rewarmed 7/28 worsening epileptiform discharges -- concerning for anoxic brain injury  7/30 Run of VT, Remains on amio, NE, milrinone. Family meeting > plan to reassess in 24-48 hours 7/31 Does not follow commands, non-purposeful movement. Tmax 100.1, tachypnea ? Neuro pattern  Consults:  Cardiology Neurology   Procedures:  7/26 ETT >> 7/26 [facility] >>  Significant Diagnostic Tests:  7/26 CXR>>pulm vascular congestion 7/26 UDS ++ cocaine 7/26 head CT>>no acute findings 7/26 Echo>> EF 20-25%, LV with severely decreased function/ global hypokinesis. LV hypertrophy, RV function is moderately reduced, RV is moderately enlarged. 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 Stable mild chronic small vessel ischemic disease  Micro Data:  7/26 blood cultures >> negative 7/26 urine culture >> negative 7/26 resp culture>> few staph epidermis >> R-erythromycin, oxacillin 7/26 MRSA > neg   Antimicrobials:  Vancomycin 7/26 >> 7/29 Cefepime 7/26 >> 7/31 Flagyl 7/26 x1  Interim history/subjective:  Remains unresponsive on vent.  Objective   Blood pressure (!) 133/68, pulse 98, temperature 98.5 F (36.9 C), resp. rate 16, height 6\' 2"  (1.88 m), weight (!) 160.1 kg, SpO2 96 %. CVP:  [7 mmHg-16 mmHg] 10 mmHg  Vent Mode: PRVC FiO2 (%):  [40 %] 40 % Set Rate:  [16 bmp] 16 bmp Vt Set:  [650 mL] 650 mL PEEP:  [5 cmH20] 5 cmH20 Pressure Support:  [10 cmH20] 10 cmH20 Plateau Pressure:  [17 cmH20-18 cmH20] 18 cmH20   Intake/Output Summary (Last 24 hours) at 02/25/2020 0755 Last data filed at 02/25/2020 04/26/2020 Gross per 24 hour  Intake 1478.89 ml  Output 1425 ml  Net 53.89 ml   Filed Weights   02/23/20 0400 02/24/20 0427 02/25/20 0500  Weight: (!) 168.3 kg (!) 164.5 kg (!) 160.1 kg    Examination: GEN: obese man on vent HEENT: ETT in place, thick tan secretions CV: Irregular, ext warm PULM: Scattered rhonci, triggers vent GI: Soft, +BS EXT: trace edema NEURO:  Pupils pinpoint but reactive, equal Corneals present Has cough/gag Triggers vent Oculocephalic reflex present He has no response to pain He has recurrent decorticate posturing not in response  to any stimuli  PSYCH: cannot assess SKIN: no rashes  Sodium up BUN/Cr stable CBC now with mild leukocytosis Off abx, low grade temps yesterday; staph epi in trach aspirate 7/31 Net even fluid wise but weight down 4 kg CXR- given size cannot really say much, looks same as yesterday  Resolved Hospital Problem list       Assessment & Plan:   Asystolic cardiac arrest/Vfib cardiac arrest NICM with HFrEF Cardiogenic Shock ROSC achieved around . Possibly precipitated by respiratory arrest as his wife heard agonal breathing prior to losing pulse. Note UDS positive for cocaine. Last echo in March 2021 showing EF of 30-35%, down to 20-25%. Heart cath 09/2019 showed mild non-obstructive disease except for severe disease in a small PL branch. -continue amiodarone, levophed for MAP >65 -tele monitoring  -appreciate Cardiology assistance  Paroxysmal atrial fibrillation Intermittent VTach  CHADSVASc=5. Anticoagulated on xarelto PTA -heparin gtt per pharmacy  -amiodarone as above, tele monitoring  -goal K>4, Mg >2  Anoxic Brain Injury Epileptiform Discharges  Primary concern right now is hypoxic injury.  Head CT negative.  Neurology has discussed c/f anoxic injury with family. Unable to do MRI brain due to patient size -continue keppra -try to limit sedation  Acute respiratory failure requiring intubation Right Pleural Effusion  -PRVC 8cc/kg as rest mode  -Mental status precludes safe extubation  Sepsis -?  Aspiration, off abx, staph epi in sputum Pt with history of chronic LLE wound, but doubt this is source -abx as above  -WOC following  -Monitor fever/WBC curve, if continues in wrong direction will treat the staph epi  Anemia -trend CBC   Acute kidney injury, hypernatremia Likely ATN related to hypoperfusion.  Improved -Trend BMP / urinary output -Replace electrolytes as indicated -Avoid nephrotoxic agents, ensure adequate renal perfusion -8/2: start FWF and metolazone x 1  T2DM.  -SSI + levemir, contolled at present  GOC- numerous conversations noted and tentative deadline for potential transition Wednesday, appreciate palliative input.  Best practice:  Diet: TF Pain/Anxiety/Delirium protocol (if indicated): Precedex PRN ativan PRN fent  VAP protocol (if indicated): yes DVT  prophylaxis: heparin gtt GI prophylaxis: pepcid Glucose control: SSI + basal  Mobility: BR Code Status: Full Family Communication: updated father at bedside 8/2 Disposition: ICU   The patient is critically ill with multiple organ systems failure and requires high complexity decision making for assessment and support, frequent evaluation and titration of therapies, application of advanced monitoring technologies and extensive interpretation of multiple databases. Critical Care Time devoted to patient care services described in this note independent of APP/resident time (if applicable)  is 34 minutes.   Myrla Halsted MD [facility] 02/25/2020 8:02 AM Personal pager: [phone] If unanswered, please page CCM On-call: [phone]

## 2020-02-25 NOTE — Progress Notes (Signed)
Pt's ETT found in pt's right hand and measuring 17 at lips during RT rounds. With RN's assistance, RT readjusted ETT back to 28cm at top lip, commercial tube holder changed and R wrist restraint placed on pt. Exhaled volumes WNL, BBS rhonchus. Pt is very restless on the vent.

## 2020-02-26 ENCOUNTER — Ambulatory Visit: Payer: Medicare HMO | Admitting: Podiatry

## 2020-02-26 DIAGNOSIS — L8990 Pressure ulcer of unspecified site, unspecified stage: Secondary | ICD-10-CM | POA: Insufficient documentation

## 2020-02-26 DIAGNOSIS — Z8669 Personal history of other diseases of the nervous system and sense organs: Secondary | ICD-10-CM

## 2020-02-26 LAB — CBC
HCT: 35.1 % — ABNORMAL LOW (ref 39.0–52.0)
Hemoglobin: 10.7 g/dL — ABNORMAL LOW (ref 13.0–17.0)
MCH: 31.8 pg (ref 26.0–34.0)
MCHC: 30.5 g/dL (ref 30.0–36.0)
MCV: 104.2 fL — ABNORMAL HIGH (ref 80.0–100.0)
Platelets: 146 10*3/uL — ABNORMAL LOW (ref 150–400)
RBC: 3.37 MIL/uL — ABNORMAL LOW (ref 4.22–5.81)
RDW: 15.1 % (ref 11.5–15.5)
WBC: 9.9 10*3/uL (ref 4.0–10.5)
nRBC: 0.2 % (ref 0.0–0.2)

## 2020-02-26 LAB — GLUCOSE, CAPILLARY
Glucose-Capillary: 146 mg/dL — ABNORMAL HIGH (ref 70–99)
Glucose-Capillary: 146 mg/dL — ABNORMAL HIGH (ref 70–99)
Glucose-Capillary: 151 mg/dL — ABNORMAL HIGH (ref 70–99)
Glucose-Capillary: 154 mg/dL — ABNORMAL HIGH (ref 70–99)
Glucose-Capillary: 154 mg/dL — ABNORMAL HIGH (ref 70–99)
Glucose-Capillary: 159 mg/dL — ABNORMAL HIGH (ref 70–99)
Glucose-Capillary: 167 mg/dL — ABNORMAL HIGH (ref 70–99)
Glucose-Capillary: 181 mg/dL — ABNORMAL HIGH (ref 70–99)
Glucose-Capillary: 185 mg/dL — ABNORMAL HIGH (ref 70–99)

## 2020-02-26 LAB — HEPARIN LEVEL (UNFRACTIONATED)
Heparin Unfractionated: 0.27 IU/mL — ABNORMAL LOW (ref 0.30–0.70)
Heparin Unfractionated: 0.4 IU/mL (ref 0.30–0.70)

## 2020-02-26 LAB — BASIC METABOLIC PANEL
Anion gap: 13 (ref 5–15)
BUN: 49 mg/dL — ABNORMAL HIGH (ref 6–20)
CO2: 24 mmol/L (ref 22–32)
Calcium: 8.4 mg/dL — ABNORMAL LOW (ref 8.9–10.3)
Chloride: 106 mmol/L (ref 98–111)
Creatinine, Ser: 1.86 mg/dL — ABNORMAL HIGH (ref 0.61–1.24)
GFR calc Af Amer: 46 mL/min — ABNORMAL LOW (ref >60)
GFR calc non Af Amer: 40 mL/min — ABNORMAL LOW (ref >60)
Glucose, Bld: 184 mg/dL — ABNORMAL HIGH (ref 70–99)
Potassium: 3.2 mmol/L — ABNORMAL LOW (ref 3.5–5.1)
Sodium: 143 mmol/L (ref 135–145)

## 2020-02-26 LAB — COOXEMETRY PANEL
Carboxyhemoglobin: 0.9 % (ref 0.5–1.5)
Methemoglobin: 0.6 % (ref 0.0–1.5)
O2 Saturation: 67.9 %
Total hemoglobin: 13.1 g/dL (ref 12.0–16.0)

## 2020-02-26 LAB — MAGNESIUM: Magnesium: 1.8 mg/dL (ref 1.7–2.4)

## 2020-02-26 MED ORDER — MAGNESIUM SULFATE 2 GM/50ML IV SOLN
2.0000 g | Freq: Once | INTRAVENOUS | Status: AC
  Administered 2020-02-26: 2 g via INTRAVENOUS
  Filled 2020-02-26: qty 50

## 2020-02-26 MED ORDER — POTASSIUM CHLORIDE 20 MEQ/15ML (10%) PO SOLN
20.0000 meq | ORAL | Status: AC
  Administered 2020-02-26: 20 meq
  Filled 2020-02-26: qty 15

## 2020-02-26 MED ORDER — POTASSIUM CHLORIDE 10 MEQ/50ML IV SOLN
10.0000 meq | INTRAVENOUS | Status: AC
  Administered 2020-02-26 (×4): 10 meq via INTRAVENOUS
  Filled 2020-02-26 (×3): qty 50

## 2020-02-26 NOTE — Progress Notes (Signed)
K+ 3.2, Mg 1.8 Replaced per protocol  

## 2020-02-26 NOTE — Progress Notes (Signed)
eLink Physician-Brief Progress Note Patient Name: Maurice Nguyen DOB: October 03, 1965 MRN: [id number]   Date of Service  02/26/2020  HPI/Events of Note  Multiple loose stools. RN requests FMS.  eICU Interventions  Order entered for FMS.     Intervention Category Minor Interventions: Routine modifications to care plan (e.g. PRN medications for pain, fever)  Marveen Reeks Eliabeth Shoff 02/26/2020, 7:00 AM

## 2020-02-26 NOTE — Progress Notes (Signed)
ANTICOAGULATION CONSULT NOTE  Pharmacy Consult for heparin Indication: atrial fibrillation  Assessment: 54 yom on Xarelto PTA for afib presenting s/p CPR with ROSC, intubated and code cool initiated. Pharmacy consulted to dose heparin for afib.   Heparin level is slightly subtherapeutic at 0.27, on 2100 units/hr. Hgb 10.7, plt 146. No s/sx of bleeding or infusion issues.   Goal of Therapy:  Heparin level 0.3-0.7 units/ml Monitor platelets by anticoagulation protocol: Yes   Plan:  Increase heparin at 2200 units/hr Check heparin level in am  Thanks for allowing pharmacy to be a part of this patient's care.  Sherron Monday, PharmD, BCCCP Clinical Pharmacist  Phone: [phone] 02/26/2020 8:37 AM  Please check AMION for all [facility] Pharmacy phone numbers After 10:00 PM, call Main Pharmacy [phone]

## 2020-02-26 NOTE — Progress Notes (Signed)
Seen and examined with resident. Epic will not let me addend note. Seen in f/u for prolonged OOH arrest likely related to cocaine use with resulting severe anoxic brain injury No events. Remains poorly responsive on exam. EEG unrevealing. Continue supportive care, awaiting family to come in tomorrow to discuss plan of care, likely palliative withdrawal of life support.  Myrla Halsted MD PCCM

## 2020-02-26 NOTE — Progress Notes (Signed)
ANTICOAGULATION CONSULT NOTE  Pharmacy Consult for IV Heparin Indication: atrial fibrillation   Total Body Weight: 160.8 kg Height: 74 inches Heparin Dosing Weight: 120.2 kg  Assessment: 54 yr old male on Xarelto PTA for afib presented S/P CPR with ROSC, intubated and code cool initiated. Pharmacy was consulted to dose heparin for afib.   Heparin level 5.5 hrs after increasing heparin infusion to 2200 units/hr was 0.40 units/ml, which is within the goal range for this pt. H/H 10.7/35.1, platelets 146. Per RN, no issues with IV or bleeding observed.  Goal of Therapy:  Heparin level 0.3-0.7 units/ml Monitor platelets by anticoagulation protocol: Yes   Plan:  Continue heparin infusion at 2200 units/hr Check confirmatory heparin level in 6 hrs Monitor daily heparin level, CBC Monitor for signs/symptoms of bleeding F/U goals of care  Vicki Mallet, PharmD, BCPS, [facility] Clinical Pharmacist 02/26/2020 7:28 PM

## 2020-02-26 NOTE — Progress Notes (Addendum)
Attending addendum  See separate note from today, was locked out of this note for a while.  Myrla Halsted MD PCCM     NAME:  Maurice Nguyen, MRN:  [id number], DOB:  07/07/1966, LOS: 7 ADMISSION DATE:  02/16/2020, CONSULTATION DATE: 02/08/2020 REFERRING MD: Renaye Rakers, CHIEF COMPLAINT: Cardiac arrest  Brief History   Maurice Nguyen is a 54 year old male with past medical history of heart failure reduced ejection fraction (EF of 30 to 35%), hypertension, diabetes who was admitted after asystole cardiac arrest requiring 8 rounds of CPR (~ 30 minutes).  He then transition to V. fib which responded to 1 shock.On arrival to the ED, he was noted to be hypothermic with temp of 83.58F. Labs showing trop of 167, K 5.4. Head CT negative for acute process.   History of present illness   Patient is sedated on vent. Patient is not responsive to verbal stimulation. He is withdraw to pain but not following commands.   Past Medical History  NICM, HFrEF (30-35%), afib, HTN, PAD, T2DM  Significant Hospital Events   7/26 admission 7/27 rewarmed 7/28 worsening epileptiform discharges -- concerning for anoxic brain injury  7/30 Run of VT, Remains on amio, NE, milrinone. Family meeting > plan to reassess in 24-48 hours 7/31 Does not follow commands, non-purposeful movement. Tmax 100.1, tachypnea ? Neuro pattern  Consults:  Cardiology Neurology Palliative  Procedures:  7/26 ETT >> 7/26 [facility] >>  Significant Diagnostic Tests:  7/26 CXR>>pulm vascular congestion 7/26 UDS ++ cocaine 7/26 head CT>>no acute findings 7/26 Echo>> EF 20-25%, LV with severely decreased function/ global hypokinesis. LV hypertrophy, RV function is moderately reduced, RV is moderately enlarged. Aortic Dilation 93mm 7/26 LTM EEG >>suppression, generalized , Polyspikes, generalized 7/27 >>EEG suppression, generalized ,continuous slow, generalized, Periodic epileptiform discharges with triphasic morphology, generalized 7/28 EEG  >>worsening epileptiform discharges  7/29 CT Head without contrast>>No CT evidence of acute intracranial abnormalitychronic cortically based infarct within the lateral left occipital lobe.Stable mild chronic small vessel ischemic disease  Micro Data:  7/26 blood cultures >> negative 7/26 urine culture >> negative 7/26 resp culture>> few staph epidermis >> R-erythromycin, oxacillin 7/26 MRSA >neg   Antimicrobials:  Vancomycin 7/26 >> 7/29 Cefepime 7/26 >> 7/31 Flagyl 7/26 x1  Interim history/subjective:  Patient is sedated on vent. Not responsive to verbal stimulation. Withdraw to pain stimulation. No purposeful movement.  Objective   Blood pressure 120/87, pulse 89, temperature 97.7 F (36.5 C), resp. rate 16, height 6\' 2"  (1.88 m), weight (!) 160.8 kg, SpO2 100 %. CVP:  [5 mmHg-14 mmHg] 5 mmHg  Vent Mode: PRVC FiO2 (%):  [40 %] 40 % Set Rate:  [16 bmp] 16 bmp Vt Set:  [650 mL] 650 mL PEEP:  [5 cmH20] 5 cmH20 Pressure Support:  [12 cmH20] 12 cmH20 Plateau Pressure:  [14 cmH20-17 cmH20] 14 cmH20   Intake/Output Summary (Last 24 hours) at 02/26/2020 0744 Last data filed at 02/26/2020 0700 Gross per 24 hour  Intake 2821.82 ml  Output 1397 ml  Net 1424.82 ml   Filed Weights   02/24/20 0427 02/25/20 0500 02/26/20 0415  Weight: (!) 164.5 kg (!) 160.1 kg (!) 160.8 kg    Examination: Physical Exam Constitutional:      General: He is not in acute distress.    Appearance: He is obese.     Comments: Sedated on ventilator, not responsive to verbal stimulation, withdraw to painful stimuli, no purposeful movements  HENT:     Head: Normocephalic.  Eyes:  Comments: Pinpoint pupil, equal bilateral  Cardiovascular:     Rate and Rhythm: Normal rate and regular rhythm.     Heart sounds: No murmur heard.  Pulmonary:     Effort:  No respiratory distress. On ventilator Abdominal:     General: There is no distension.     Palpations: Abdomen is soft.  Musculoskeletal:     Right  lower leg: Edema (Trace) present.     Left lower leg: Edema (Trace) present.  Skin:    General: Skin is warm.     Coloration: Skin is not jaundiced.  Neurological:     Comments: [facility] Problem list     Assessment & Plan:  Cardiac arrest Acute hypoxic encephalopathy Acute hypoxic respiratory failure requiring mechanical ventilation Right pleural effusion Paroxysmal A. fib AKI Type 2 diabetes  Plan: Acute hypoxic encephalopathy secondary to cardiac arrest  Due to prolonged time to achieve ROSC (~30 minutes), hypoxic brain injury is expected to be severe. Patient is unlikely to have a good prognosis.  Neurology is on board with treatment planned and have discussed goal of care with family.  Palliative care was consulted.  Per family's wishes, continue intervention until Wednesday and if no improvement observed, will consider to proceed with comfort care. No seizure activity noted on EEG.  -Continue Keppra 500 mg twice daily -Continue valproic acid 500 mg twice daily -Continue Ativan IV as needed -Continue to observe for any neurological improvements   Acute hypoxic respiratory failure requiring mechanical ventilation Right pulmonary effusion Chest x-ray 8/2 shows improvement of right lung base opacity.  Bilateral atelectasis persists.  Continue patient on ventilator.  Patient is not a candidate for extubation due to his mental status.  Sedation with propofol. -PRVC, FiO2 40, PEEP of 5, tidal volume 650, respiratory rate 16 -Sedation with propofol    Cardiogenic shock Heart failure with reduced ejection fraction Echocardiogram 7/26 shows EF of 20 to 25%, global hypokinesis of left ventricle, with mild dilation of aortic root. -Continue Levophed to maintain MAP greater than 65   Paroxysmal A. Fib V. fib cardiac arrest Rate is relatively under controlled.  Telemetry shows normal sinus rhythm.  Patient was on Xarelto at home.  Chads score was 5.   Cardiology is on board. -Appreciate cardiology recommendations -Continue amiodarone drip -Heparin infusion for DVT   AKI Likely prerenal secondary to hypoperfusion.  Baseline creatinine of 1.1 in April. Creatine worsens today at 1.86, likely secondary to diuretic. Patient still has trace edema in LE and UE bilaterally.  -Continue normal saline infusion at 10 cc/h to ensure adequate renal perfusion -Continue free water flushes with tube feed -BMP daily - Continue to replace Potassium and Mag as needed.    Best practice:  Diet: Prosource, vital AF Pain/Anxiety/Delirium protocol (if indicated): Versed as needed, sedation with propofol VAP protocol (if indicated): N/A DVT prophylaxis: Heparin GI prophylaxis: Protonix Glucose control: Levemir 10 units daily and sliding scale Mobility: Bed rest Code Status: Partial Family Communication: Per primary team Disposition: Continue interventions and watch for improvements until Wednesday.  If not will consider transition to comfort care  Critical care time: 20 min     Doran Stabler, DO My pager: [phone]

## 2020-02-26 NOTE — Progress Notes (Signed)
Progress Note  Patient Name: Maurice Nguyen Date of Encounter: 02/26/2020  Primary Cardiologist: Mertie Moores, MD   Subjective   Intubated and sedated.  Not following commands.  Rates improved in AF.  Inpatient Medications    Scheduled Meds: . chlorhexidine gluconate (MEDLINE KIT)  15 mL Mouth Rinse BID  . Chlorhexidine Gluconate Cloth  6 each Topical Daily  . docusate  100 mg Per Tube BID  . feeding supplement (PROSource TF)  90 mL Per Tube QID  . free water  200 mL Per Tube Q4H  . insulin aspart  0-15 Units Subcutaneous Q4H  . insulin detemir  10 Units Subcutaneous Daily  . mouth rinse  15 mL Mouth Rinse 10 times per day  . pantoprazole (PROTONIX) IV  40 mg Intravenous Q12H  . polyethylene glycol  17 g Per Tube Daily  . potassium chloride  20 mEq Per Tube Q4H  . sodium chloride flush  10-40 mL Intracatheter Q12H  . valproic acid  500 mg Oral BID   Continuous Infusions: . sodium chloride    . sodium chloride Stopped (02/26/20 0159)  . amiodarone 30 mg/hr (02/26/20 0700)  . feeding supplement (VITAL HIGH PROTEIN) 1,000 mL (02/25/20 1421)  . heparin 2,100 Units/hr (02/26/20 0700)  . levETIRAcetam Stopped (02/25/20 2227)  . norepinephrine (LEVOPHED) Adult infusion Stopped (02/25/20 0546)  . potassium chloride 10 mEq (02/26/20 0739)  . propofol (DIPRIVAN) infusion 30 mcg/kg/min (02/26/20 0700)   PRN Meds: acetaminophen (TYLENOL) oral liquid 160 mg/5 mL, docusate, midazolam, polyethylene glycol, sodium chloride flush   Vital Signs    Vitals:   02/26/20 0700 02/26/20 0740 02/26/20 0800 02/26/20 0818  BP: 120/87  121/84   Pulse: 89  88 93  Resp:    16  Temp: (!) 97.3 F (36.3 C) 97.7 F (36.5 C) (!) 97.2 F (36.2 C) (!) 97.2 F (36.2 C)  TempSrc:      SpO2: 100%  100% 98%  Weight:      Height:        Intake/Output Summary (Last 24 hours) at 02/26/2020 0859 Last data filed at 02/26/2020 0700 Gross per 24 hour  Intake 2821.82 ml  Output 1397 ml  Net 1424.82  ml   Filed Weights   02/24/20 0427 02/25/20 0500 02/26/20 0415  Weight: (!) 164.5 kg (!) 160.1 kg (!) 160.8 kg    Telemetry    Atrial fib with rates 70-80s- Personally Reviewed  ECG    none - Personally Reviewed  Physical Exam   GEN: No acute distress.   Neck: unable to assess JVD Cardiac: normal rates, irregular, no murmurs, rubs, or gallops.  Respiratory: D rhonchi GI: Soft, nontender, non-distended  MS: No edema; No deformity. Neuro:  no purposeful movements Psych: unresponsive  Labs    Chemistry Recent Labs  Lab 02/20/20 0405 02/20/20 0405 02/21/20 0221 02/21/20 0518 02/23/20 0357 02/23/20 1627 02/24/20 0243 02/25/20 0218 02/26/20 0449  NA 138   < > 140   < > 144   < > 146* 148* 143  K 4.7   < > 3.8   < > 3.6   < > 3.9 3.6 3.2*  CL 100   < > 101   < > 107   < > 109 112* 106  CO2 22   < > 24   < > 26   < > '27 26 24  ' GLUCOSE 243*   < > 182*   < > 144*   < > 150*  189* 184*  BUN 49*   < > 56*   < > 42*   < > 29* 38* 49*  CREATININE 3.75*   < > 3.69*   < > 2.13*   < > 1.64* 1.78* 1.86*  CALCIUM 8.3*   < > 8.3*   < > 8.7*   < > 8.7* 8.6* 8.4*  PROT 6.8  --  6.5  --  6.8  --   --   --   --   ALBUMIN 2.7*  --  2.4*  --  2.4*  --   --   --   --   AST 95*  --  39  --  21  --   --   --   --   ALT 275*  --  176*  --  88*  --   --   --   --   ALKPHOS 82  --  69  --  64  --   --   --   --   BILITOT 1.4*  --  1.3*  --  1.2  --   --   --   --   GFRNONAA 17*   < > 17*   < > 34*   < > 46* 42* 40*  GFRAA 20*   < > 20*   < > 39*   < > 53* 48* 46*  ANIONGAP 16*   < > 15   < > 11   < > '10 10 13   ' < > = values in this interval not displayed.     Hematology Recent Labs  Lab 02/24/20 0243 02/25/20 0218 02/26/20 0449  WBC 9.3 12.2* 9.9  RBC 3.76* 3.47* 3.37*  HGB 11.7* 11.0* 10.7*  HCT 37.2* 35.6* 35.1*  MCV 98.9 102.6* 104.2*  MCH 31.1 31.7 31.8  MCHC 31.5 30.9 30.5  RDW 14.6 14.8 15.1  PLT 124* 138* 146*    Cardiac EnzymesNo results for input(s): TROPONINI in  the last 168 hours. No results for input(s): TROPIPOC in the last 168 hours.   BNP No results for input(s): BNP, PROBNP in the last 168 hours.   DDimer No results for input(s): DDIMER in the last 168 hours.   Radiology    EEG  Result Date: 02/25/2020 Lora Havens, MD     02/25/2020  1:30 PM Patient Name: Maurice Nguyen MRN: [id number] Epilepsy Attending: Lora Havens Referring Physician/Provider: Dr Zeb Comfort Date: 02/25/2020 Duration: 26.09 mins  Patient history: 54yo M s/p cardiac arrest and TTM. EEG to evaluate for seizure.  Level of alertness:  comatose  AEDs during EEG study:  Keppra, valproic acid, propofol  Technical aspects: This EEG study was done with scalp electrodes positioned according to the 10-20 International system of electrode placement. Electrical activity was acquired at a sampling rate of '500Hz'  and reviewed with a high frequency filter of '70Hz'  and a low frequency filter of '1Hz' . EEG data were recorded continuously and digitally stored.  Description: EEG showing burst suppression pattern with 4 to 5 seconds of EEG suppression as well as intermittent 1 to 2 seconds of generalized 5 to 6 Hz theta slowing. EEG is reactive to noxious stimulation.  Hyperventilation and photic stimulation were not performed.    ABNORMALITY -Burst suppression, generalized  IMPRESSION: This study showed evidence of profound diffuse encephalopathy, nonspecific etiology but likely related to sedation, anoxic/hypoxic brain injury.  No seizures or epileptiform discharges were seen during the study.  Priyanka O  Yadav   DG CHEST PORT 1 VIEW  Result Date: 02/25/2020 CLINICAL DATA:  Hypoxia EXAM: PORTABLE CHEST 1 VIEW COMPARISON:  February 24, 2020 FINDINGS: Endotracheal tube tip is 5.7 cm above the carina. Nasogastric tube tip and side port are below the diaphragm. Central catheter tip is in the left innominate vein near the junction with the left jugular vein. No pneumothorax. There is a small  left pleural effusion. There is bibasilar atelectasis. No appreciable consolidation. The heart size is normal. There is mild pulmonary venous hypertension. No adenopathy. No bone lesions. IMPRESSION: Tube and catheter positions as described without pneumothorax. Cardiomegaly with a degree of pulmonary vascular congestion. Bibasilar atelectasis without edema or consolidation. Electronically Signed   By: Lowella Grip III M.D.   On: 02/25/2020 08:09    Cardiac Studies   TTE 01/29/2020: 1. Left ventricular ejection fraction, by estimation, is 20 to 25%. The  left ventricle has severely decreased function. The left ventricle  demonstrates global hypokinesis. There is mild concentric left ventricular  hypertrophy. Left ventricular diastolic  function could not be evaluated.  2. Right ventricular systolic function is moderately reduced. The right  ventricular size is moderately enlarged.  3. The mitral valve is grossly normal. No evidence of mitral valve  regurgitation. No evidence of mitral stenosis.  4. The aortic valve is tricuspid. Aortic valve regurgitation is not  visualized. No aortic stenosis is present.  5. Aortic dilatation noted. There is mild dilatation of the aortic root  measuring 41 mm.   Patient Profile     54 y.o. male admitted with VF arrest, 30 minute down time, now with VDRF  Assessment & Plan    VF arrest - he has had no additional VF. Continue IV amiodarone  Atrial fib - he remains in atrial fib. Rates appear controlled. Continue IV amio  Frequent PVCs: improved today.  Maintain K>4, Mag>2  Anoxic encephalopathy -  no purposeful movements. Palliative medicine consulted, planning palliative extubation if no improvement by tomorrow   For questions or updates, please contact [place] Please consult [email or web address] for contact info under Cardiology/STEMI.   Signed, Donato Heinz, MD  02/26/2020, 8:59 AM  Patient ID: Maurice Nguyen, male   DOB:  1966-04-07, 54 y.o.   MRN: [id number]

## 2020-02-27 LAB — BASIC METABOLIC PANEL
Anion gap: 11 (ref 5–15)
BUN: 49 mg/dL — ABNORMAL HIGH (ref 6–20)
CO2: 25 mmol/L (ref 22–32)
Calcium: 8.6 mg/dL — ABNORMAL LOW (ref 8.9–10.3)
Chloride: 107 mmol/L (ref 98–111)
Creatinine, Ser: 1.68 mg/dL — ABNORMAL HIGH (ref 0.61–1.24)
GFR calc Af Amer: 52 mL/min — ABNORMAL LOW (ref >60)
GFR calc non Af Amer: 45 mL/min — ABNORMAL LOW (ref >60)
Glucose, Bld: 180 mg/dL — ABNORMAL HIGH (ref 70–99)
Potassium: 3.4 mmol/L — ABNORMAL LOW (ref 3.5–5.1)
Sodium: 143 mmol/L (ref 135–145)

## 2020-02-27 LAB — CBC
HCT: 33.2 % — ABNORMAL LOW (ref 39.0–52.0)
Hemoglobin: 10.4 g/dL — ABNORMAL LOW (ref 13.0–17.0)
MCH: 31.1 pg (ref 26.0–34.0)
MCHC: 31.3 g/dL (ref 30.0–36.0)
MCV: 99.4 fL (ref 80.0–100.0)
Platelets: 197 10*3/uL (ref 150–400)
RBC: 3.34 MIL/uL — ABNORMAL LOW (ref 4.22–5.81)
RDW: 15.4 % (ref 11.5–15.5)
WBC: 9.3 10*3/uL (ref 4.0–10.5)
nRBC: 0.4 % — ABNORMAL HIGH (ref 0.0–0.2)

## 2020-02-27 LAB — COOXEMETRY PANEL
Carboxyhemoglobin: 1.3 % (ref 0.5–1.5)
Methemoglobin: 0.9 % (ref 0.0–1.5)
O2 Saturation: 73.7 %
Total hemoglobin: 15 g/dL (ref 12.0–16.0)

## 2020-02-27 LAB — GLUCOSE, CAPILLARY
Glucose-Capillary: 152 mg/dL — ABNORMAL HIGH (ref 70–99)
Glucose-Capillary: 178 mg/dL — ABNORMAL HIGH (ref 70–99)
Glucose-Capillary: 171 mg/dL — ABNORMAL HIGH (ref 70–99)
Glucose-Capillary: 167 mg/dL — ABNORMAL HIGH (ref 70–99)

## 2020-02-27 LAB — MAGNESIUM: Magnesium: 1.8 mg/dL (ref 1.7–2.4)

## 2020-02-27 LAB — HEPARIN LEVEL (UNFRACTIONATED): Heparin Unfractionated: 0.31 IU/mL (ref 0.30–0.70)

## 2020-02-27 MED ORDER — POLYVINYL ALCOHOL 1.4 % OP SOLN
1.0000 [drp] | Freq: Four times a day (QID) | OPHTHALMIC | Status: DC | PRN
  Filled 2020-02-27: qty 15

## 2020-02-27 MED ORDER — ACETAMINOPHEN 650 MG RE SUPP: 650.0000 mg | Freq: Four times a day (QID) | RECTAL | Status: DC | PRN

## 2020-02-27 MED ORDER — GLYCOPYRROLATE 0.2 MG/ML IJ SOLN: 0.2000 mg | INTRAMUSCULAR | Status: DC | PRN

## 2020-02-27 MED ORDER — MORPHINE BOLUS VIA INFUSION
5.0000 mg | INTRAVENOUS | Status: DC | PRN
  Filled 2020-02-27: qty 5

## 2020-02-27 MED ORDER — DIPHENHYDRAMINE HCL 50 MG/ML IJ SOLN: 25.0000 mg | INTRAMUSCULAR | Status: DC | PRN

## 2020-02-27 MED ORDER — ACETAMINOPHEN 325 MG PO TABS: 650.0000 mg | ORAL_TABLET | Freq: Four times a day (QID) | ORAL | Status: DC | PRN

## 2020-02-27 MED ORDER — POTASSIUM CHLORIDE 20 MEQ/15ML (10%) PO SOLN
40.0000 meq | Freq: Once | ORAL | Status: AC
  Administered 2020-02-27: 40 meq
  Filled 2020-02-27: qty 30

## 2020-02-27 MED ORDER — LORAZEPAM 2 MG/ML IJ SOLN: 2.0000 mg | INTRAMUSCULAR | Status: DC | PRN

## 2020-02-27 MED ORDER — GLYCOPYRROLATE 1 MG PO TABS
1.0000 mg | ORAL_TABLET | ORAL | Status: DC | PRN
  Filled 2020-02-27: qty 1

## 2020-02-27 MED ORDER — MORPHINE SULFATE (PF) 2 MG/ML IV SOLN: 2.0000 mg | INTRAVENOUS | Status: DC | PRN

## 2020-02-27 MED ORDER — MORPHINE 100MG IN NS 100ML (1MG/ML) PREMIX INFUSION
0.0000 mg/h | INTRAVENOUS | Status: DC
  Administered 2020-02-27 (×3): 60 mg/h via INTRAVENOUS
  Administered 2020-02-28: 70 mg/h via INTRAVENOUS
  Administered 2020-02-28: 60 mg/h via INTRAVENOUS
  Filled 2020-02-27 (×5): qty 100

## 2020-02-27 MED ORDER — MORPHINE 100MG IN NS 100ML (1MG/ML) PREMIX INFUSION
0.0000 mg/h | INTRAVENOUS | Status: DC
  Administered 2020-02-27: 5 mg/h via INTRAVENOUS
  Filled 2020-02-27: qty 100

## 2020-02-27 MED ORDER — METOPROLOL TARTRATE 25 MG PO TABS
25.0000 mg | ORAL_TABLET | Freq: Two times a day (BID) | ORAL | Status: DC
  Administered 2020-02-27: 25 mg via ORAL
  Filled 2020-02-27: qty 1

## 2020-02-27 MED ORDER — MAGNESIUM SULFATE 2 GM/50ML IV SOLN
2.0000 g | Freq: Once | INTRAVENOUS | Status: AC
  Administered 2020-02-27: 2 g via INTRAVENOUS
  Filled 2020-02-27: qty 50

## 2020-02-27 MED ORDER — DEXTROSE 5 % IV SOLN: INTRAVENOUS | Status: DC

## 2020-02-27 NOTE — Procedures (Signed)
Extubation Procedure Note  Patient Details:   Name: JAQUAVIAN FIRKUS DOB: 06/04/1966 MRN: [id number]   Airway Documentation:    Vent end date: 02/27/20 Vent end time: 1730   Evaluation  O2 sats: currently acceptable Complications: No apparent complications Patient did tolerate procedure well. Bilateral Breath Sounds: Rhonchi   No   Pt was extubated to comfort care measures per family request and MD order.   Mehek Grega A Anber Mckiver 02/27/2020, 5:33 PM

## 2020-02-27 NOTE — Progress Notes (Signed)
Patient ID: Maurice Nguyen, male   DOB: 03-Mar-1966, 54 y.o.   MRN: [id number]  This NP visited patient at the bedside as a follow up for palliative medicine needs and emotional support.    [name] is a 54 year old male with a past medical history significant for DM, hypertension, CKD, CHF, atrial fibrillation, and morbid obesityadmitted on7/26/2021with cardiogenic shock from cardiac arrestin the setting of cocaine use, acute respiratory failure with hypoxia in setting of cardiac arrest, bradycardia.  Today is day 9 of this hospitalization, EEG suggestive of profound diffuse encephalopathy likely related to an anoxic brain injury.  Patient  does withdrawal to painful stimuli, and opened eyes today off of sedation.    Continued conversation/education had today with father and significant other/[name] who are at the bedside, regarding current medical situation, long-term likely poor prognosis, treatment option decisions and anticipatory care needs.  Education offered regarding the difference between an aggressive medical intervention path and a palliative comfort path for this patient at this time in this situation.  Detailed treatment plan as it relates to trach/PEG and need for long term care facility.  Questions and concerns addressed to the best of my ability.  Father verbalizes an understanding of the seriousness of the situation and he "knows" that his son would not want to live his life dependent on machines, in a nursing home and unable to "get up and get around". He verbalizes that he does not want to see his son suffer.  Father tells me that family, there are 4 siblings,  is planning to come today at 4:00 for ongoing conversation with the medical team regarding treatment options and decisions. Discussed with Dr. Katrinka Blazing and Dr. Melynda Ripple who are both able to be here today at that time for this meeting with this family.  The chaplain plans to attend also.   This nurse practitioner  will not be able to participate at that time, family notified  Emotional support offered.  Discussed with Dr Katrinka Blazing and Dr. Melynda Ripple  Total time spent on the unit was 35 minutes   PMT will continue to support holistically.  Patient's father is encouraged to call with questions or concerns.  Greater than 50% of the time was spent in counseling and coordination of care  Lorinda Creed NP  Palliative Medicine Team Team Phone # [phone] Pager [phone]

## 2020-02-27 NOTE — Progress Notes (Signed)
Progress Note  Patient Name: Maurice Nguyen Date of Encounter: 02/27/2020  Primary Cardiologist: Mertie Moores, MD   Subjective   Intubated.  Not following commands.  Inpatient Medications    Scheduled Meds:  chlorhexidine gluconate (MEDLINE KIT)  15 mL Mouth Rinse BID   Chlorhexidine Gluconate Cloth  6 each Topical Daily   docusate  100 mg Per Tube BID   feeding supplement (PROSource TF)  90 mL Per Tube QID   free water  200 mL Per Tube Q4H   insulin aspart  0-15 Units Subcutaneous Q4H   insulin detemir  10 Units Subcutaneous Daily   mouth rinse  15 mL Mouth Rinse 10 times per day   pantoprazole (PROTONIX) IV  40 mg Intravenous Q12H   polyethylene glycol  17 g Per Tube Daily   sodium chloride flush  10-40 mL Intracatheter Q12H   valproic acid  500 mg Oral BID   Continuous Infusions:  sodium chloride     sodium chloride Stopped (02/26/20 0159)   amiodarone 30 mg/hr (02/27/20 0700)   feeding supplement (VITAL HIGH PROTEIN) 1,000 mL (02/26/20 1607)   heparin 2,200 Units/hr (02/27/20 0700)   levETIRAcetam Stopped (02/26/20 2243)   norepinephrine (LEVOPHED) Adult infusion Stopped (02/25/20 0546)   propofol (DIPRIVAN) infusion 20 mcg/kg/min (02/27/20 0700)   PRN Meds: acetaminophen (TYLENOL) oral liquid 160 mg/5 mL, docusate, midazolam, polyethylene glycol, sodium chloride flush   Vital Signs    Vitals:   02/27/20 0630 02/27/20 0703 02/27/20 0736 02/27/20 0800  BP:  134/76 (!) 144/91 (!) 140/95  Pulse: (!) 110 64 (!) 112 (!) 27  Resp:   19 20  Temp: 99.3 F (37.4 C) 99.5 F (37.5 C)  99.7 F (37.6 C)  TempSrc:    Core  SpO2: 92% 97% 97% 91%  Weight:      Height:        Intake/Output Summary (Last 24 hours) at 02/27/2020 0900 Last data filed at 02/27/2020 0700 Gross per 24 hour  Intake 3295.24 ml  Output 2030 ml  Net 1265.24 ml   Filed Weights   02/25/20 0500 02/26/20 0415 02/27/20 0500  Weight: (!) 160.1 kg (!) 160.8 kg (!) 152.3 kg      Telemetry    Atrial fib with rates 110-130s- Personally Reviewed  ECG    none - Personally Reviewed  Physical Exam   GEN: No acute distress.   Neck: unable to assess JVD Cardiac: tachycardic irregular, no murmurs, rubs, or gallops.  Respiratory: rhonchi GI: Soft, nontender, non-distended  MS: No edema; No deformity. Neuro:  opens eyes, not following commands  Psych: unable to assess  Labs    Chemistry Recent Labs  Lab 02/21/20 0221 02/21/20 0518 02/23/20 0357 02/23/20 1627 02/25/20 0218 02/26/20 0449 02/27/20 0331  NA 140   < > 144   < > 148* 143 143  K 3.8   < > 3.6   < > 3.6 3.2* 3.4*  CL 101   < > 107   < > 112* 106 107  CO2 24   < > 26   < > _0 GLUCOSE 182*   < > 144*   < > 189* 184* 180*  BUN 56*   < > 42*   < > 38* 49* 49*  CREATININE 3.69*   < > 2.13*   < > 1.78* 1.86* 1.68*  CALCIUM 8.3*   < > 8.7*   < > 8.6* 8.4* 8.6*  PROT 6.5  --  6.8  --   --   --   --   ALBUMIN 2.4*  --  2.4*  --   --   --   --   AST 39  --  21  --   --   --   --   ALT 176*  --  88*  --   --   --   --   ALKPHOS 69  --  64  --   --   --   --   BILITOT 1.3*  --  1.2  --   --   --   --   GFRNONAA 17*   < > 34*   < > 42* 40* 45*  GFRAA 20*   < > 39*   < > 48* 46* 52*  ANIONGAP 15   < > 11   < > _0 < > = values in this interval not displayed.     Hematology Recent Labs  Lab 02/25/20 0218 02/26/20 0449 02/27/20 0331  WBC 12.2* 9.9 9.3  RBC 3.47* 3.37* 3.34*  HGB 11.0* 10.7* 10.4*  HCT 35.6* 35.1* 33.2*  MCV 102.6* 104.2* 99.4  MCH 31.7 31.8 31.1  MCHC 30.9 30.5 31.3  RDW 14.8 15.1 15.4  PLT 138* 146* 197    Cardiac EnzymesNo results for input(s): TROPONINI in the last 168 hours. No results for input(s): TROPIPOC in the last 168 hours.   BNP No results for input(s): BNP, PROBNP in the last 168 hours.   DDimer No results for input(s): DDIMER in the last 168 hours.   Radiology    EEG  Result Date: 02/25/2020 Lora Havens, MD     02/25/2020  1:30  PM Patient Name: Maurice Nguyen MRN: [id number] Epilepsy Attending: Lora Havens Referring Physician/Provider: Dr Zeb Comfort Date: 02/25/2020 Duration: 26.09 mins  Patient history: 54yo M s/p cardiac arrest and TTM. EEG to evaluate for seizure.  Level of alertness:  comatose  AEDs during EEG study:  Keppra, valproic acid, propofol  Technical aspects: This EEG study was done with scalp electrodes positioned according to the 10-20 International system of electrode placement. Electrical activity was acquired at a sampling rate of _1  and reviewed with a high frequency filter of _2  and a low frequency filter of _3 . EEG data were recorded continuously and digitally stored.  Description: EEG showing burst suppression pattern with 4 to 5 seconds of EEG suppression as well as intermittent 1 to 2 seconds of generalized 5 to 6 Hz theta slowing. EEG is reactive to noxious stimulation.  Hyperventilation and photic stimulation were not performed.    ABNORMALITY -Burst suppression, generalized  IMPRESSION: This study showed evidence of profound diffuse encephalopathy, nonspecific etiology but likely related to sedation, anoxic/hypoxic brain injury.  No seizures or epileptiform discharges were seen during the study.  Lora Havens    Cardiac Studies   TTE 02/13/2020: 1. Left ventricular ejection fraction, by estimation, is 20 to 25%. The  left ventricle has severely decreased function. The left ventricle  demonstrates global hypokinesis. There is mild concentric left ventricular  hypertrophy. Left ventricular diastolic  function could not be evaluated.  2. Right ventricular systolic function is moderately reduced. The right  ventricular size is moderately enlarged.  3. The mitral valve is grossly normal. No evidence of mitral valve  regurgitation. No evidence of mitral stenosis.  4. The aortic valve is tricuspid. Aortic valve regurgitation is not  visualized. No aortic stenosis  is  present.  5. Aortic dilatation noted. There is mild dilatation of the aortic root  measuring 41 mm.   Patient Profile     54 y.o. male admitted with VF arrest, 30 minute down time, now with VDRF  Assessment & Plan    VF arrest - he has had no additional VF. Continue IV amiodarone for now  Atrial fib - he remains in atrial fib. Rates elevated and he is off pressors. Continue IV amio.  Will add metoprolol for rate control   Frequent PVCs: Maintain K>4, Mag>2.  Add metoprolol as above  Anoxic encephalopathy -  no purposeful movements. Palliative consulted  For questions or updates, please contact [place] Please consult [email or web address] for contact info under Cardiology/STEMI.   Signed, Donato Heinz, MD  02/27/2020, 9:00 AM  Patient ID: Maurice Nguyen, male   DOB: 1966/03/31, 54 y.o.   MRN: [id number]

## 2020-02-27 NOTE — Progress Notes (Signed)
Chaplain offered support to Mr. Catterton Sr. And his significant other, [name], as they learned more about Travian's condition for medical physicians and staff.  Mr. Vidales and [name] seem to both be expressing that they do not want [place] to suffer but they would like for the family, which includes Ruddy's siblings, to come to a consensus.    Chaplain will follow-up at family meeting today at 4:00 pm.

## 2020-02-27 NOTE — Progress Notes (Signed)
Evening Chaplain arrived and took over care from [address].  Family was in an out of the room as they prepared for extubation.  Chaplain connected with patient's father, sisters and significant other-[name].  Chaplain remained at bedside during the extubation and following as family would take turns.  Family is struggling to let go, still hoping for a turn around.  Chaplain offered ways they can be with the patient including handing a washcloth to wipe the patient's head.  Chaplain encouraged family to talk to the patient.  Maurice Nguyen reports patient is a believe/Christian but not connected to a church.  Chaplain offered ministry of presence, empathetic/reflective listening and words of comfort, support and prayer.  Chaplain will follow up later.  Family have all gone home.  Chaplain available as needed. Chaplain Agustin Cree, MDiv.     02/27/20 1900  Clinical Encounter Type  Visited With Patient and family together;Health care provider  Visit Type Patient actively dying  Referral From Chaplain  Consult/Referral To Chaplain  Spiritual Encounters  Spiritual Needs Emotional;Grief support;Prayer  Stress Factors  Family Stress Factors Loss

## 2020-02-27 NOTE — Plan of Care (Signed)
Discussed neuro prognosis with family including father, brother and sisters as well as significant other [name]. They state that patient wouldn't have wanted to be on long term trach/peg and decided to pursue terminal extubation.  Marshelle Bilger Annabelle Harman

## 2020-02-27 NOTE — Progress Notes (Signed)
Long discussion with family and Dr. Melynda Nguyen regarding what Maurice Nguyen would want in the current situation.  Currently, continues to have poor neurological exam almost 10 days after cardiac arrest.  The most likely scenario here is life in a long term care facility hoping for improvement with a trach/PEG.  Family does not think he would have wanted that so they would like him to be extubated with plans for morphine if any signs of distress or discomfort.  Maurice Halsted MD PCCM

## 2020-02-27 NOTE — Progress Notes (Signed)
Chaplain offered support to family through family meeting and assisted family in seeing [name].  Maurice Nguyen was able to voice to his children that he no longer wanted to see [name] suffer and that he didn't want his son to have a poor quality of life.  Family still had quite a few questions about extubation and what it meant to see [name] with his eyes open and with some body movement.   Chaplain Alvester Morin was able to step in and further provide support to family.

## 2020-02-27 NOTE — Progress Notes (Addendum)
NAME:  Maurice Nguyen, MRN:  [id number], DOB:  [birth year], LOS: 9 ADMISSION DATE:02/23/2020, CONSULTATION DATE:02/23/2020 REFERRING KZ:[id number], CHIEF COMPLAINT:Cardiac arrest  Brief History   Maurice Nguyen is a 54 year old male with past medical history of heart failure reduced ejection fraction (EF of 30 to 35%), hypertension, diabetes who was admitted after asystole cardiac arrestrequiring 8 rounds of CPR (~30 minutes).He then transition to V. fib which responded to 1 shock.On arrival to the ED, he was noted to be hypothermic with temp of 83.70F. Labs showing trop of 167, K 5.4. Head CT negative for acute process.   Past Medical History  NICM, HFrEF (30-35%), afib, HTN, PAD, T2DM  Significant Hospital Events   7/26 admission 7/27 rewarmed 7/28 worsening epileptiform discharges -- concerning for anoxic brain injury  7/30 Run of VT, Remains on amio, NE, milrinone. Family meeting > plan to reassess in 24-48 hours 7/31 Does not follow commands, non-purposeful movement. Tmax 100.1, tachypnea ? Neuro pattern  Consults:  Cardiology Neurology Palliative  Procedures:  7/26 ETT >> 7/26 LIJ >>  Significant Diagnostic Tests:  7/26 CXR>>pulm vascular congestion 7/26 UDS ++ cocaine 7/26 head CT>>no acute findings 7/26 Echo>> EF 20-25%, LV with severely decreased function/ global hypokinesis. LV hypertrophy, RV function is moderately reduced, RV is moderately enlarged. Aortic Dilation 36mm 7/26 LTM EEG >>suppression, generalized , Polyspikes, generalized 7/27 >>EEG suppression, generalized ,continuous slow, generalized, Periodic epileptiform discharges with triphasic morphology, generalized 7/28 EEG >>worsening epileptiform discharges  7/29 CT Head without contrast>>No CT evidence of acute intracranial abnormalitychronic cortically based infarct within the lateral left occipital lobe.Stable mild chronic small vessel ischemic disease  Micro Data:  7/26 blood cultures >>  negative 7/26 urine culture >> negative 7/26 resp culture>> few staph epidermis >> R-erythromycin, oxacillin 7/26 MRSA >neg  Antimicrobials:  Vancomycin 7/26 >> 7/29 Cefepime 7/26 >> 7/31 Flagyl 7/26 x1  Interim history/subjective:  Patient is seen at bedside.  He is sedated, not responsive to verbal stimulation, is withdraw to pain, no purposeful movement.  Palliative team and PCCM team will talk to the family today regarding transition to comfort care due to the lack of neurological improvement in the last 48 hours.  Objective   Blood pressure 137/82, pulse (!) 110, temperature 99.3 F (37.4 C), resp. rate 16, height 6\' 2"  (1.88 m), weight (!) 152.3 kg, SpO2 92 %. CVP:  [7 mmHg-10 mmHg] 10 mmHg  Vent Mode: PRVC FiO2 (%):  [30 %] 30 % Set Rate:  [16 bmp] 16 bmp Vt Set:  [650 mL] 650 mL PEEP:  [5 cmH20] 5 cmH20 Plateau Pressure:  [14 cmH20-17 cmH20] 16 cmH20   Intake/Output Summary (Last 24 hours) at 02/27/2020 0659 Last data filed at 02/27/2020 0600 Gross per 24 hour  Intake 3504.53 ml  Output 2180 ml  Net 1324.53 ml   Filed Weights   02/25/20 0500 02/26/20 0415 02/27/20 0500  Weight: (!) 160.1 kg (!) 160.8 kg (!) 152.3 kg    Examination: Physical Exam Constitutional:  General: He is not in acute distress. Appearance: He is obese.  Comments: Sedated on ventilator, not responsive to verbal stimulation, withdraw to painful stimuli, no purposeful movements.  No obvious improvement compared to yesterday HENT:  Head: Normocephalic.  Eyes:  Comments: Pinpoint pupil, equal bilateral Cardiovascular:  Rate and Rhythm: Normal rateand irregular rhythm.  Heart sounds: No murmurheard.  Pulmonary:  Effort:  Norespiratory distress. On ventilator Abdominal:  General: There is no distension.  Palpations: Abdomen is soft.  Musculoskeletal:  Right lower leg:  Edema(Trace)present.  Left lower leg: Edema(Trace)present.  Skin: General:  Skin is warm.  Coloration: Skin is not jaundiced.  Neurological:  Comments: [facility] Problem list     Assessment & Plan:  Cardiac arrest Acute hypoxic encephalopathy Acute hypoxic respiratory failure requiring mechanical ventilation Right pleural effusion Paroxysmal A. fib AKI Type 2 diabetes  Plan: Acute hypoxic encephalopathy secondary to cardiac arrest  Due to prolonged time to achieve ROSC(~30 minutes), hypoxic brain injury is expected to be severe. Patient is unlikely to have a good prognosis.  Patient has not shown any obvious improvement in neurological status in the past 48 hours.  Neurology has been following the patient and no seizure activity was noted on the EEG.  Palliative care was consulted and will speak with family today regarding transition to comfort care. -Continue Keppra 500 mg twice daily -Continue valproic acid 500 mg twice daily -Continue Ativan IV as needed   Acute hypoxic respiratory failure requiring mechanical ventilation Right pulmonary effusion Continue patient on ventilator. Patient is not a candidate for extubation due to his mental status. Sedation with propofol.  Will speak with family today, if they agree we will extubate the patient and transition to comfort care at -PRVC, FiO2 30, PEEP of 5, tidal volume 650, respiratory rate 16 -Sedation with propofol    Cardiogenic shock Heart failure with reduced ejection fraction Echocardiogram 7/26 shows EF of 20 to 25%, global hypokinesis of left ventricle, with mild dilation of aortic root. -Patient is off of Levophed   Paroxysmal A. Fib V. fib cardiac arrest Rate is relatively under controlled.  -Continue amiodarone drip -Heparin infusion for DVT   AKI Likely prerenal secondary to hypoperfusion. Baseline creatinine of 1.1 in April. Creatine improved slightly to 1.68 -Continue normal saline infusion at 10 cc/h to ensure adequate renal perfusion -Continue  free water flushes with tube feed -BMP daily -Continue to replace Potassium and Mag as needed.   Best practice:  Diet:Prosource, vital AF Pain/Anxiety/Delirium protocol (if indicated):Versed as needed VAP protocol (if indicated):yes DVT prophylaxis:Heparin GI prophylaxis:Protonix Glucose control:Levemir 10 units daily and sliding scale Mobility:Bed rest Code Status:Partial Family Communication: Per primary team Disposition:Palliative care and PCCM will speak with family today regarding transition to comfort care.   Critical care time: 20 min     Doran Stabler, DO My pager: [phone]

## 2020-02-27 NOTE — Progress Notes (Signed)
ANTICOAGULATION CONSULT NOTE  Pharmacy Consult for IV Heparin Indication: atrial fibrillation   Total Body Weight: 160.8 kg Height: 74 inches Heparin Dosing Weight: 120.2 kg  Assessment: 54 yr old male on Xarelto PTA for afib presented S/P CPR with ROSC, intubated and code cool initiated. Pharmacy was consulted to dose heparin for afib.   Heparin level came back therapeutic at 0.31, on 2200 units/hr. Hgb 10.4, plt 197. No s/sx of bleeding or infusion issues.   Goal of Therapy:  Heparin level 0.3-0.7 units/ml Monitor platelets by anticoagulation protocol: Yes   Plan:  Increase heparin infusion slightly to 2300 units/hr to keep in goal range Monitor daily heparin level, CBC Monitor for signs/symptoms of bleeding F/U goals of care - family meeting planned for later today  Sherron Monday, PharmD, BCCCP Clinical Pharmacist  Phone: [phone] 02/27/2020 12:02 PM  Please check AMION for all [facility] Pharmacy phone numbers After 10:00 PM, call Main Pharmacy [phone]

## 2020-02-27 NOTE — Progress Notes (Signed)
K+ 3.4 Replaced per protocol  

## 2020-02-27 NOTE — Progress Notes (Addendum)
Subjective: Opening eyes after turning off sedation.  However has nonpurposeful movements, tachyardia off sedation and therefore needs to be on minimal amount of sedation.  Patient's father at bedside.  ROS: unable to obtain due to poor mental status  Examination  Vital signs in last 24 hours: Temp:  [97.7 F (36.5 C)-99.7 F (37.6 C)] 99.7 F (37.6 C) (08/04 0800) Pulse Rate:  [27-123] 117 (08/04 1126) Resp:  [16-30] 30 (08/04 1126) BP: (106-160)/(66-111) 125/96 (08/04 1126) SpO2:  [91 %-99 %] 99 % (08/04 1126) FiO2 (%):  [30 %] 30 % (08/04 1126) Weight:  [152.3 kg] 152.3 kg (08/04 0500)  General: lying in bed,not in apparent distress CVS: pulse-normal rate and rhythm RJ:JOACZYSAY, initiating spontaneous breaths Extremities: normal,warm Neuro:Opens eyes spontaneously, blinking, does not track, gag reflex intact, withdraws to noxious stimuli in  bilateral upper extremities  Basic Metabolic Panel: Recent Labs  Lab 02/21/20 0518 02/21/20 0525 02/22/20 0531 02/22/20 0531 02/23/20 0357 02/23/20 0357 02/23/20 1627 02/23/20 1627 02/24/20 0243 02/24/20 0243 02/25/20 0218 02/26/20 0449 02/27/20 0331  NA  --    < > 142   < > 144   < > 146*  --  146*  --  148* 143 143  K 3.8   < > 3.9   < > 3.6   < > 3.8  --  3.9  --  3.6 3.2* 3.4*  CL  --    < > 104   < > 107   < > 108  --  109  --  112* 106 107  CO2  --    < > 26   < > 26   < > 26  --  27  --  26 24 25   GLUCOSE  --    < > 187*   < > 144*   < > 163*  --  150*  --  189* 184* 180*  BUN  --    < > 52*   < > 42*   < > 35*  --  29*  --  38* 49* 49*  CREATININE  --    < > 2.70*   < > 2.13*   < > 1.74*  --  1.64*  --  1.78* 1.86* 1.68*  CALCIUM  --    < > 8.5*   < > 8.7*   < > 8.7*   < > 8.7*   < > 8.6* 8.4* 8.6*  MG 1.7   < > 1.8   < > 1.8  --  1.8  --   --   --  1.9 1.8 1.8  PHOS 5.9*  --  4.6  --   --   --   --   --   --   --   --   --   --    < > = values in this interval not displayed.    CBC: Recent Labs  Lab  02/23/20 0357 02/24/20 0243 02/25/20 0218 02/26/20 0449 02/27/20 0331  WBC 9.7 9.3 12.2* 9.9 9.3  HGB 11.4* 11.7* 11.0* 10.7* 10.4*  HCT 35.6* 37.2* 35.6* 35.1* 33.2*  MCV 97.5 98.9 102.6* 104.2* 99.4  PLT 128* 124* 138* 146* 197     Coagulation Studies: No results for input(s): LABPROT, INR in the last 72 hours.  Imaging No new brain imaging overnight.  ASSESSMENT AND PLAN: 54 year old male with multiple comorbidities as noted above presented after cardiac arrest status post TTM,total downtime about 30 minutes. Continuous EEG  showed generalized periodic epileptiform discharges, currently on Keppra.  Status epilepticus (resolved) Cardiac arrest status post TTM Suspected anoxic/hypoxic brain injury -Patient has had slight improvement in neurological exam (now opening eyes spontaneously).  However this is after about 10 days since initial injury.  Unfortunately, we cannot obtain MRI brain due to patient's body habitus.  Recommendations -Continue keppra and VPA -Given prolonged downtime, poor neurologic exam with patient still not tracking, following commands, I suspect patient has suffered anoxic/hypoxic brain injury with low chances of meaningful neurologic recovery.  - Family meeting scheduled at 4 PM today to discuss terminal extubation versus tracheostomy/PEG with family. -Continue to minimize sedation for neuro prognostication - IV Ativan 2mg  for any clinical seizure like activity -Management of rest of comorbidities per primary team  CRITICAL CARE Performed by:   Total critical care time:92minutes  Critical care time was exclusive of separately billable procedures and treating other patients.  Critical care was necessary to treat or prevent imminent or life-threatening deterioration.  Critical care was time spent personally by me on the following activities: development of treatment plan with patient and/or surrogate as well as nursing,  discussions with consultants, evaluation of patient's response to treatment, examination of patient, obtaining history from patient or surrogate, ordering and performing treatments and interventions, ordering and review of laboratory studies, ordering and review of radiographic studies, pulse oximetry and re-evaluation of patient's condition.   21m Epilepsy [facility] For questions after 5pm please refer to AMION to reach the Neurologist on call

## 2020-03-26 NOTE — Death Summary Note (Signed)
DEATH SUMMARY   Patient Details  Name: Maurice Nguyen MRN: [id number] DOB: 1966-03-28  Admission/Discharge Information   Admit Date:  03/02/2020  Date of Death: Date of Death: 03-12-20  Time of Death: Time of Death: 0257  Length of Stay: 16-Nov-2022  Referring Physician: Tracey Harries, MD   Reason(s) for Hospitalization  Cardiac arrest  Diagnoses  Preliminary cause of death:  Secondary Diagnoses (including complications and co-morbidities):  Active Problems:   Cardiac arrest (HCC)   Asystole (HCC)   Acute respiratory failure (HCC)   Palliative care by specialist   Goals of care, counseling/discussion   DNR (do not resuscitate) discussion   Concern about end of life   Pressure injury of skin   History of anoxic brain injury   Brief Hospital Course (including significant findings, care, treatment, and services provided and events leading to death)  Maurice Nguyen is a 54 year old male with past medical history of heart failure reduced ejection fraction (EF of 30 to 35%), hypertension, diabetes who was admitted after asystole cardiac arrestrequiring 8 rounds of CPR (~30 minutes).He then transition to V. fib which responded to 1 shock.On arrival to the ED, he was noted to be hypothermic with temp of 83.48F. Labs showing trop of 167, K 5.4. Head CT negative for acute process.   Urine toxicology positive for cocaine. Despite targeted temperature management, patient never regained consciousness.  Multiple family discussions had with eventual decision to allow patient to pass in peace without further life support.   Pertinent Labs and Studies  Significant Diagnostic Studies EEG  Result Date: 02/25/2020 Charlsie Quest, MD     02/25/2020  1:30 PM Patient Name: Maurice Nguyen MRN: [id number] Epilepsy Attending: Charlsie Quest Referring Physician/Provider: Dr Lindie Spruce Date: 02/25/2020 Duration: 26.09 mins  Patient history: 54yo M s/p cardiac arrest and TTM. EEG to evaluate for  seizure.  Level of alertness:  comatose  AEDs during EEG study:  Keppra, valproic acid, propofol  Technical aspects: This EEG study was done with scalp electrodes positioned according to the 10-20 International system of electrode placement. Electrical activity was acquired at a sampling rate of 500Hz  and reviewed with a high frequency filter of 70Hz  and a low frequency filter of 1Hz . EEG data were recorded continuously and digitally stored.  Description: EEG showing burst suppression pattern with 4 to 5 seconds of EEG suppression as well as intermittent 1 to 2 seconds of generalized 5 to 6 Hz theta slowing. EEG is reactive to noxious stimulation.  Hyperventilation and photic stimulation were not performed.    ABNORMALITY -Burst suppression, generalized  IMPRESSION: This study showed evidence of profound diffuse encephalopathy, nonspecific etiology but likely related to sedation, anoxic/hypoxic brain injury.  No seizures or epileptiform discharges were seen during the study.    DG Abd 1 View  Result Date: 02/20/2020 CLINICAL DATA:  Orogastric tube placement EXAM: ABDOMEN - 1 VIEW COMPARISON:  10/17/2019 FINDINGS: Examination is limited secondary to poor penetration from patient body habitus. Enteric tube courses below the diaphragm with distal tip and side port terminating within the level of the gastric body. Included bowel gas pattern appears nonobstructive. IMPRESSION: Enteric tube courses below the diaphragm with distal tip and side port terminating at the level of the gastric body. Electronically Signed   By: Charlsie Quest D.O.   On: 02/20/2020 15:14   CT HEAD WO CONTRAST  Result Date: 02/21/2020 CLINICAL DATA:  Anoxic brain damage. Additional provided: Cardiac arrest. EXAM: CT HEAD WITHOUT  CONTRAST TECHNIQUE: Contiguous axial images were obtained from the base of the skull through the vertex without intravenous contrast. COMPARISON:  Head CT examinations 02/08/2020 and  12/11/2019. 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 No definite loss of gray-white differentiation is identified elsewhere. Stable, mild chronic small vessel ischemic disease within the cerebral white matter. This includes a small chronic lacunar infarct within the right centrum semiovale (series 3, image 26). There is no acute intracranial hemorrhage. No extra-axial fluid collection. No evidence of intracranial mass. No midline shift. Vascular: No hyperdense vessel.  Atherosclerotic calcifications. Skull: Normal. Negative for fracture or focal lesion. Sinuses/Orbits: Visualized orbits show no acute finding. Small right maxillary sinus mucous retention cyst. Trace fluid within left mastoid air cells. IMPRESSION: No CT evidence of acute intracranial abnormality. Please note brain MRI would have greater sensitivity for acute hypoxic/ischemic injury. 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 Stable mild chronic small vessel ischemic disease. Electronically Signed   By: Jackey Loge DO   On: 02/21/2020 19:37   CT Head Wo Contrast  Result Date: 01/29/2020 CLINICAL DATA:  Cardiac arrest with altered mental status EXAM: CT HEAD WITHOUT CONTRAST TECHNIQUE: Contiguous axial images were obtained from the base of the skull through the vertex without intravenous contrast. COMPARISON:  Dec 11, 2019 FINDINGS: Brain: Ventricles are normal in size and configuration. There is mild frontal and parietal lobe atrophy, stable. There is no intracranial mass, hemorrhage, extra-axial fluid collection, or midline shift. There is evidence of a prior infarct in the inferior most aspect of the left occipital lobe, stable. There is slight small vessel disease in the centra semiovale bilaterally. No acute infarct is appreciable. Vascular: No hyperdense vessel. There are foci of calcification in each carotid siphon region. Skull: The bony calvarium  appears intact. Sinuses/Orbits: Patient is intubated. There is mucosal thickening in multiple ethmoid air cells. There is mucosal thickening in the anterior sphenoid sinus. There is opacification in posterior nares regions, likely a function of the intubation. Orbits appear symmetric bilaterally. Other: Mastoid air cells are clear. IMPRESSION: Mild frontal and parietal atrophy, stable. Prior infarct inferior left occipital lobe, stable. Mild periventricular small vessel disease, likewise stable. No acute infarct demonstrable. No mass or hemorrhage. Foci of arterial vascular calcification noted. Foci of paranasal sinus disease noted. Patient intubated. Electronically Signed   By: Bretta Bang III M.D.   On: 01/26/2020 11:15   CARDIAC CATHETERIZATION  Result Date: 02/12/2020 Successful placement of a temporary transvenous pacemaker into the RV.  DG CHEST PORT 1 VIEW  Result Date: 02/25/2020 CLINICAL DATA:  Hypoxia EXAM: PORTABLE CHEST 1 VIEW COMPARISON:  February 24, 2020 FINDINGS: Endotracheal tube tip is 5.7 cm above the carina. Nasogastric tube tip and side port are below the diaphragm. Central catheter tip is in the left innominate vein near the junction with the left jugular vein. No pneumothorax. There is a small left pleural effusion. There is bibasilar atelectasis. No appreciable consolidation. The heart size is normal. There is mild pulmonary venous hypertension. No adenopathy. No bone lesions. IMPRESSION: Tube and catheter positions as described without pneumothorax. Cardiomegaly with a degree of pulmonary vascular congestion. Bibasilar atelectasis without edema or consolidation. Electronically Signed   By: Bretta Bang III M.D.   On: 02/25/2020 08:09   DG CHEST PORT 1 VIEW  Result Date: 02/24/2020 CLINICAL DATA:  Acute respiratory failure with hypoxia. EXAM: PORTABLE CHEST 1 VIEW COMPARISON:  02/23/2020 and earlier exams. FINDINGS: Mild persistent interstitial  thickening and central  vascular congestion. Base opacities which appear mildly improved on the right but stable on the left, most likely combination atelectasis and small effusions. No pneumothorax. Endotracheal tube, nasal/orogastric tube and left internal jugular central venous line stable. IMPRESSION: 1. Right lung base opacity appears mildly improved from the previous day's exam, consistent with improved atelectasis. Residual lung base opacities are most likely due to residual atelectasis and small effusions. 2. Persistent mild vascular congestion and interstitial thickening. No new lung abnormalities. 3. Stable support apparatus. Electronically Signed   By: Amie Portland M.D.   On: 02/24/2020 09:31   DG CHEST PORT 1 VIEW  Result Date: 02/23/2020 CLINICAL DATA:  Respiratory failure EXAM: PORTABLE CHEST 1 VIEW COMPARISON:  Chest x-rays dated 02/21/2020 and 02/20/2020. FINDINGS: Stable cardiomegaly. Continued central pulmonary vascular congestion. Continued bibasilar opacities, most likely atelectasis and/or small pleural effusions. No pneumothorax is seen. Endotracheal tube appears adequately positioned with tip at the level of the clavicles. Enteric tube passes below the diaphragm. LEFT IJ central line is stable in position with tip at the level of the upper SVC. IMPRESSION: 1. Stable cardiomegaly with central pulmonary vascular congestion suggesting mild CHF/volume overload. 2. Continued bibasilar opacities, most likely atelectasis and/or small pleural effusions. 3. Support apparatus appears stable in position. Electronically Signed   By: Bary Richard M.D.   On: 02/23/2020 12:23   DG Chest Port 1 View  Result Date: 02/21/2020 CLINICAL DATA:  Intubation.  Respiratory failure. EXAM: PORTABLE CHEST 1 VIEW COMPARISON:  02/20/2020. FINDINGS: Endotracheal tube, left IJ line, NG 2 in stable position. Again noted is cardiomegaly with pulmonary venous congestion and bilateral interstitial prominence consistent with CHF. Small left  pleural effusion. Similar findings noted on prior exam no pneumothorax. IMPRESSION: 1.  Lines and tubes stable position. 2. Again noted is cardiomegaly with pulmonary venous congestion, bilateral interstitial prominence, and small left pleural effusion. Findings consistent with CHF. Similar findings noted on prior exam. Electronically Signed   By: Maisie Fus  Register   On: 02/21/2020 07:36   DG CHEST PORT 1 VIEW  Result Date: 02/20/2020 CLINICAL DATA:  Possible aspiration. EXAM: PORTABLE CHEST 1 VIEW COMPARISON:  02/20/2020 FINDINGS: NG tube enters the stomach. Left central line is unchanged. Cardiomegaly with vascular congestion. Bilateral lower lobe airspace opacities are similar to prior study. No acute bony abnormality. IMPRESSION: Stable cardiomegaly with vascular congestion and bilateral lower lobe atelectasis or infiltrates. Electronically Signed   By: Charlett Nose M.D.   On: 02/20/2020 11:27   DG CHEST PORT 1 VIEW  Result Date: 02/20/2020 CLINICAL DATA:  Endotracheal tube present. EXAM: PORTABLE CHEST 1 VIEW COMPARISON:  February 19, 2020. FINDINGS: Stable cardiomegaly. Nasogastric tube is seen entering the stomach. Endotracheal tube is partially visualized. Left internal jugular catheter is unchanged. No pneumothorax is noted. Central pulmonary vascular congestion is noted with bibasilar atelectasis or edema. Bony thorax is unremarkable. IMPRESSION: Stable cardiomegaly. Central pulmonary vascular congestion is noted with bibasilar atelectasis or edema. Stable support apparatus. No pneumothorax is noted. Electronically Signed   By: Lupita Raider M.D.   On: 02/20/2020 08:12   DG Chest Port 1 View  Result Date: 02/19/2020 CLINICAL DATA:  Respiratory failure. EXAM: PORTABLE CHEST 1 VIEW COMPARISON:  2020-03-02. FINDINGS: Endotracheal tube, NG tube, left IJ line stable position. Cardiomegaly. Pulmonary venous congestion. Low lung volumes. Mild bilateral interstitial prominence suggesting mild CHF. Mild  right base alveolar infiltrates/edema noted on today's exam. No pneumothorax. IMPRESSION: 1.  Lines and tubes in stable position.  2. Cardiomegaly with pulmonary venous congestion. Low lung volumes with mild bilateral interstitial prominence suggesting mild CHF noted on today's exam. 3.  Mild right base alveolar infiltrate/edema noted on today's exam. Electronically Signed   By: Maisie Fus  Register   On: 02/19/2020 06:41   DG CHEST PORT 1 VIEW  Result Date: 01/31/2020 CLINICAL DATA:  Status post central line placement today. EXAM: PORTABLE CHEST 1 VIEW COMPARISON:  Single-view of the chest earlier today. FINDINGS: The patient has a new left IJ approach central venous catheter with the tip projecting in the mid superior vena cava. No pneumothorax. NG tube and endotracheal tube remain in place in good position. Lung volumes are low but the lungs appear clear. IMPRESSION: New left IJ approach central venous catheter tip projects in the mid superior vena cava. No pneumothorax. Endotracheal tube and NG tube in good position. Lungs appear clear. Electronically Signed   By: Drusilla Kanner M.D.   On: 02/14/2020 15:46   DG CHEST PORT 1 VIEW  Result Date: 02/10/2020 CLINICAL DATA:  ETT advancement EXAM: PORTABLE CHEST 1 VIEW COMPARISON:  02/03/2020 at 1022 hours FINDINGS: Interval advancement of endotracheal tube which now terminates 2.5 cm above the carina. Enteric tube courses below the diaphragm with distal tip beyond the inferior margin of the film. Low lung volumes. Stable cardiomegaly. Pulmonary vascular congestion without overt edema. No lobar consolidation. Left costophrenic angle was excluded from the field of view. No pneumothorax. IMPRESSION: 1. Interval advancement of endotracheal tube which now terminates 2.5 cm above the carina. 2. Otherwise, stable chest. Electronically Signed   By: Duanne Guess D.O.   On: 01/30/2020 12:15   DG Chest Portable 1 View  Result Date: 01/30/2020 CLINICAL DATA:  Hypoxia  EXAM: PORTABLE CHEST 1 VIEW COMPARISON:  October 08, 2019 FINDINGS: Endotracheal tube tip is at the T1 level, 8.2 cm above the carina. Nasogastric tube tip and side port in stomach. No pneumothorax. There is cardiomegaly with pulmonary venous hypertension. There is no evident edema or airspace opacity. No adenopathy appreciable by radiography. IMPRESSION: Tube positions as described without pneumothorax. It may be prudent to consider advancing endotracheal tube 4-5 cm. There is cardiomegaly with pulmonary vascular congestion. No edema or airspace opacity. Electronically Signed   By: Bretta Bang III M.D.   On: 01/31/2020 10:40   EEG adult  Result Date: 01/28/2020 Charlsie Quest, MD     01/27/2020  5:09 PM Patient Name: Maurice Nguyen MRN: [id number] Epilepsy Attending: Charlsie Quest Referring Physician/Provider: Dr Peter [place] Date: 01/24/2020 Duration: 21.02 mins Patient history: 54yo M s/p cardiac arrest on TTM. EEG to evaluate for seizure. Level of alertness:  comatose AEDs during EEG study: None Technical aspects: This EEG study was done with scalp electrodes positioned according to the 10-20 International system of electrode placement. Electrical activity was acquired at a sampling rate of 500Hz  and reviewed with a high frequency filter of 70Hz  and a low frequency filter of 1Hz . EEG data were recorded continuously and digitally stored. Description:  EEG showed continuous generalized suppression. EEG was not reactive to tactile stimuli. In the beginning of recording, burst of generalized polyspikes were also noted. Hyperventilation and photic stimulation were not performed.   ABNORMALITY - EEG suppression, generalized - Polyspikes, generalized IMPRESSION: This study showed evidence of generalized epileptogenicity as well as profound diffuse encephalopathy, nonspecific etiology but likely related to anoxic/hypoxic brain injury Priyanka O Yadav   Overnight EEG with video  Result Date:  02/19/2020 , MD  02/20/2020  9:25 AM Patient Name: Maurice Nguyen MRN: [id number] Epilepsy Attending: Charlsie Quest Referring Physician/Provider: Dr Peter [place] Duration:  02-22-20 1655 to 02/19/2020 1655  Patient history: 54yo M s/p cardiac arrest on TTM. EEG to evaluate for seizure.  Level of alertness:  comatose  AEDs during EEG study: None  Technical aspects: This EEG study was done with scalp electrodes positioned according to the 10-20 International system of electrode placement. Electrical activity was acquired at a sampling rate of  and reviewed with a high frequency filter of  and a low frequency filter of . EEG data were recorded continuously and digitally stored.  Description:  EEG initially showed continuous generalized suppression.  Gradually EEG showed continuous generalized sharply contoured 3 to 5 Hz theta-delta slowing.  Intermittently generalized periodic epileptiform discharges with triphasic morphology were also noted at 0.5 to 1 Hz.  Hyperventilation and photic stimulation were not performed.   Patient event button was pressed on 2020/02/22 at 1724.  On video patient was noted to have generalized whole body stiffening and right arm elevation.  Concomitant EEG before, during and after the event did not show any EEG changes suggest seizure.  ABNORMALITY - EEG suppression, generalized - Continuous slow, generalized -Periodic epileptiform discharges with triphasic morphology, generalized  IMPRESSION: This study showed evidence of generalized epileptogenicity as well as severe diffuse encephalopathy, nonspecific etiology but likely related to anoxic/hypoxic brain injury EEG appears to be improving compared to previous day.  Charlsie Quest   ECHOCARDIOGRAM COMPLETE  Result Date: 2020/02/22    ECHOCARDIOGRAM REPORT   Patient Name:   Maurice Nguyen Date of Exam: 02/22/20 Medical Rec #:  [id number]          Height:       74.0 in Accession #:     [id number]         Weight:       408.1 lb Date of Birth:  [birth year]           BSA:          2.943 m Patient Age:    54 years           BP:           84/61 mmHg Patient Gender: M                  HR:           109 bpm. Exam Location:  Inpatient Procedure: 2D Echo Indications:    cardiac arrest  History:        Patient has prior history of Echocardiogram examinations, most                 recent 10/09/2019. Abnormal ECG, Arrythmias:Atrial Fibrillation;                 Risk Factors:Dyslipidemia, Hypertension, Diabetes and Sleep                 Apnea.  Sonographer:    Delcie Roch Referring Phys: [id number] MATTHEW J TRIFAN  Sonographer Comments: Echo performed with patient supine and on artificial respirator. IMPRESSIONS  1. Left ventricular ejection fraction, by estimation, is 20 to 25%. The left ventricle has severely decreased function. The left ventricle demonstrates global hypokinesis. There is mild concentric left ventricular hypertrophy. Left ventricular diastolic  function could not be evaluated.  2. Right ventricular systolic function is moderately reduced. The right ventricular size is moderately enlarged.  3. The mitral valve is grossly  normal. No evidence of mitral valve regurgitation. No evidence of mitral stenosis.  4. The aortic valve is tricuspid. Aortic valve regurgitation is not visualized. No aortic stenosis is present.  5. Aortic dilatation noted. There is mild dilatation of the aortic root measuring 41 mm. Comparison(s): Changes from prior study are noted. EF is now 20-25% with global HK. FINDINGS  Left Ventricle: Left ventricular ejection fraction, by estimation, is 20 to 25%. The left ventricle has severely decreased function. The left ventricle demonstrates global hypokinesis. The left ventricular internal cavity size was normal in size. There is mild concentric left ventricular hypertrophy. Left ventricular diastolic function could not be evaluated due to atrial fibrillation. Left ventricular  diastolic function could not be evaluated. Right Ventricle: The right ventricular size is moderately enlarged. No increase in right ventricular wall thickness. Right ventricular systolic function is moderately reduced. Left Atrium: Left atrial size was normal in size. Right Atrium: Right atrial size was normal in size. Pericardium: Trivial pericardial effusion is present. Mitral Valve: The mitral valve is grossly normal. Mild mitral annular calcification. No evidence of mitral valve regurgitation. No evidence of mitral valve stenosis. Tricuspid Valve: The tricuspid valve is grossly normal. Tricuspid valve regurgitation is not demonstrated. No evidence of tricuspid stenosis. Aortic Valve: The aortic valve is tricuspid. Aortic valve regurgitation is not visualized. No aortic stenosis is present. Pulmonic Valve: The pulmonic valve was grossly normal. Pulmonic valve regurgitation is not visualized. No evidence of pulmonic stenosis. Aorta: Aortic dilatation noted. There is mild dilatation of the aortic root measuring 41 mm. Venous: IVC assessment for right atrial pressure unable to be performed due to mechanical ventilation. IAS/Shunts: The atrial septum is grossly normal. Additional Comments: A venous catheter is visualized in the inferior vena cava, right atrium and right ventricle.  LEFT VENTRICLE PLAX 2D LVIDd:         4.70 cm LVIDs:         4.00 cm LV PW:         1.40 cm LV IVS:        1.20 cm LVOT diam:     2.60 cm LVOT Area:     5.31 cm  IVC IVC diam: 2.20 cm LEFT ATRIUM              Index       RIGHT ATRIUM           Index LA diam:        4.10 cm  1.39 cm/m  RA Area:     16.60 cm LA Vol (A2C):   100.0 ml 33.98 ml/m RA Volume:   41.20 ml  14.00 ml/m LA Vol (A4C):   65.2 ml  22.15 ml/m LA Biplane Vol: 84.3 ml  28.65 ml/m   AORTA Ao Root diam: 4.10 cm Ao Asc diam:  3.60 cm  SHUNTS Systemic Diam: 2.60 cm Lennie Odor MD Electronically signed by Lennie Odor MD Signature Date/Time: 02/27/2020/6:34:09 PM     Final     Microbiology Recent Results (from the past 240 hour(s))  Culture, respiratory (tracheal aspirate)     Status: None   Collection Time: 02/20/20 11:29 AM   Specimen: Tracheal Aspirate; Respiratory  Result Value Ref Range Status   Specimen Description TRACHEAL ASPIRATE  Final   Special Requests NONE  Final   Gram Stain   Final    ABUNDANT WBC PRESENT,BOTH PMN AND MONONUCLEAR RARE GRAM NEGATIVE RODS RARE GRAM VARIABLE ROD Performed at [facility], [address] [place], [place] [address]    Culture FEW STAPHYLOCOCCUS EPIDERMIDIS  Final   Report Status 02/23/2020 FINAL  Final   Organism ID, Bacteria STAPHYLOCOCCUS EPIDERMIDIS  Final      Susceptibility   Staphylococcus epidermidis - MIC*    CIPROFLOXACIN <=0.5 SENSITIVE Sensitive     ERYTHROMYCIN >=8 RESISTANT Resistant     GENTAMICIN <=0.5 SENSITIVE Sensitive     OXACILLIN >=4 RESISTANT Resistant     TETRACYCLINE <=1 SENSITIVE Sensitive     VANCOMYCIN 2 SENSITIVE Sensitive     TRIMETH/SULFA 40 SENSITIVE Sensitive     CLINDAMYCIN <=0.25 SENSITIVE Sensitive     RIFAMPIN <=0.5 SENSITIVE Sensitive     Inducible Clindamycin NEGATIVE Sensitive     * FEW STAPHYLOCOCCUS EPIDERMIDIS    Lab Basic Metabolic Panel: Recent Labs  Lab 02/23/20 0357 02/23/20 0357 02/23/20 1627 02/24/20 0243 02/25/20 0218 02/26/20 0449 02/27/20 0331  NA 144   < > 146* 146* 148* 143 143  K 3.6   < > 3.8 3.9 3.6 3.2* 3.4*  CL 107   < > 108 109 112* 106 107  CO2 26   < > 26 27 26 24 25   GLUCOSE 144*   < > 163* 150* 189* 184* 180*  BUN 42*   < > 35* 29* 38* 49* 49*  CREATININE 2.13*   < > 1.74* 1.64* 1.78* 1.86* 1.68*  CALCIUM 8.7*   < > 8.7* 8.7* 8.6* 8.4* 8.6*  MG 1.8  --  1.8  --  1.9 1.8 1.8   < > = values in this interval not displayed.   Liver Function Tests: Recent Labs  Lab 02/23/20 0357  AST 21  ALT 88*  ALKPHOS 64  BILITOT 1.2  PROT 6.8  ALBUMIN 2.4*   No results for input(s): LIPASE, AMYLASE in the last 168  hours. No results for input(s): AMMONIA in the last 168 hours. CBC: Recent Labs  Lab 02/23/20 0357 02/24/20 0243 02/25/20 0218 02/26/20 0449 02/27/20 0331  WBC 9.7 9.3 12.2* 9.9 9.3  HGB 11.4* 11.7* 11.0* 10.7* 10.4*  HCT 35.6* 37.2* 35.6* 35.1* 33.2*  MCV 97.5 98.9 102.6* 104.2* 99.4  PLT 128* 124* 138* 146* 197   Cardiac Enzymes: No results for input(s): CKTOTAL, CKMB, CKMBINDEX, TROPONINI in the last 168 hours. Sepsis Labs: Recent Labs  Lab 02/24/20 0243 02/25/20 0218 02/26/20 0449 02/27/20 0331  WBC 9.3 12.2* 9.9 9.3     Lorin Glass 02/29/2020, 6:56 PM

## 2020-03-26 DEATH — deceased
